# Patient Record
Sex: Male | Born: 1970 | Race: White | Hispanic: No | Marital: Married | Smoking: Former smoker
Health system: Southern US, Community
[De-identification: ages and names within clinical notes are randomized; demographics above are authoritative.]

## PROBLEM LIST (undated history)

## (undated) DIAGNOSIS — E7800 Pure hypercholesterolemia, unspecified: Secondary | ICD-10-CM

## (undated) DIAGNOSIS — K859 Acute pancreatitis without necrosis or infection, unspecified: Secondary | ICD-10-CM

## (undated) DIAGNOSIS — I10 Essential (primary) hypertension: Secondary | ICD-10-CM

## (undated) DIAGNOSIS — E119 Type 2 diabetes mellitus without complications: Secondary | ICD-10-CM

## (undated) HISTORY — PX: ANKLE SURGERY: SHX546

## (undated) HISTORY — PX: BACK SURGERY: SHX140

---

## 2000-04-21 ENCOUNTER — Encounter: Payer: Self-pay | Admitting: Specialist

## 2000-04-23 ENCOUNTER — Inpatient Hospital Stay (HOSPITAL_COMMUNITY): Admission: RE | Admit: 2000-04-23 | Discharge: 2000-04-25 | Payer: Self-pay | Admitting: Specialist

## 2000-12-16 ENCOUNTER — Encounter: Admission: RE | Admit: 2000-12-16 | Discharge: 2000-12-16 | Payer: Self-pay | Admitting: Specialist

## 2000-12-16 ENCOUNTER — Encounter: Payer: Self-pay | Admitting: Specialist

## 2001-07-27 ENCOUNTER — Encounter: Payer: Self-pay | Admitting: Orthopedic Surgery

## 2001-07-28 ENCOUNTER — Inpatient Hospital Stay (HOSPITAL_COMMUNITY): Admission: RE | Admit: 2001-07-28 | Discharge: 2001-07-29 | Payer: Self-pay | Admitting: Orthopedic Surgery

## 2001-12-22 ENCOUNTER — Encounter: Admission: RE | Admit: 2001-12-22 | Discharge: 2001-12-22 | Payer: Self-pay | Admitting: Specialist

## 2001-12-22 ENCOUNTER — Encounter: Payer: Self-pay | Admitting: Specialist

## 2001-12-24 ENCOUNTER — Encounter: Payer: Self-pay | Admitting: Specialist

## 2001-12-24 ENCOUNTER — Encounter: Admission: RE | Admit: 2001-12-24 | Discharge: 2001-12-24 | Payer: Self-pay | Admitting: Specialist

## 2002-01-20 ENCOUNTER — Inpatient Hospital Stay (HOSPITAL_COMMUNITY): Admission: RE | Admit: 2002-01-20 | Discharge: 2002-01-21 | Payer: Self-pay | Admitting: Specialist

## 2002-01-20 ENCOUNTER — Encounter: Payer: Self-pay | Admitting: Specialist

## 2002-09-03 ENCOUNTER — Ambulatory Visit (HOSPITAL_COMMUNITY): Admission: RE | Admit: 2002-09-03 | Discharge: 2002-09-03 | Payer: Self-pay | Admitting: Orthopedic Surgery

## 2002-09-03 ENCOUNTER — Encounter: Payer: Self-pay | Admitting: Orthopedic Surgery

## 2004-12-05 ENCOUNTER — Ambulatory Visit (HOSPITAL_COMMUNITY): Admission: RE | Admit: 2004-12-05 | Discharge: 2004-12-05 | Payer: Self-pay | Admitting: Neurosurgery

## 2005-02-14 ENCOUNTER — Encounter: Admission: RE | Admit: 2005-02-14 | Discharge: 2005-02-14 | Payer: Self-pay | Admitting: Neurosurgery

## 2005-06-24 ENCOUNTER — Inpatient Hospital Stay (HOSPITAL_COMMUNITY): Admission: RE | Admit: 2005-06-24 | Discharge: 2005-06-28 | Payer: Self-pay | Admitting: Neurosurgery

## 2005-08-25 ENCOUNTER — Encounter: Payer: Self-pay | Admitting: Vascular Surgery

## 2005-08-25 ENCOUNTER — Ambulatory Visit (HOSPITAL_COMMUNITY): Admission: RE | Admit: 2005-08-25 | Discharge: 2005-08-25 | Payer: Self-pay | Admitting: Neurosurgery

## 2006-11-24 ENCOUNTER — Ambulatory Visit (HOSPITAL_COMMUNITY): Admission: RE | Admit: 2006-11-24 | Discharge: 2006-11-24 | Payer: Self-pay | Admitting: Neurosurgery

## 2008-02-02 ENCOUNTER — Ambulatory Visit: Payer: Self-pay | Admitting: Cardiology

## 2008-02-04 ENCOUNTER — Inpatient Hospital Stay (HOSPITAL_COMMUNITY): Admission: AD | Admit: 2008-02-04 | Discharge: 2008-02-07 | Payer: Self-pay | Admitting: *Deleted

## 2008-02-04 ENCOUNTER — Emergency Department (HOSPITAL_COMMUNITY): Admission: EM | Admit: 2008-02-04 | Discharge: 2008-02-04 | Payer: Self-pay | Admitting: Emergency Medicine

## 2008-02-04 ENCOUNTER — Ambulatory Visit: Payer: Self-pay | Admitting: *Deleted

## 2008-02-08 ENCOUNTER — Other Ambulatory Visit (HOSPITAL_COMMUNITY): Admission: RE | Admit: 2008-02-08 | Discharge: 2008-02-10 | Payer: Self-pay | Admitting: Psychiatry

## 2008-04-11 ENCOUNTER — Emergency Department (HOSPITAL_COMMUNITY): Admission: EM | Admit: 2008-04-11 | Discharge: 2008-04-11 | Payer: Self-pay | Admitting: Family Medicine

## 2008-04-25 ENCOUNTER — Emergency Department: Payer: Self-pay | Admitting: Emergency Medicine

## 2008-09-21 ENCOUNTER — Encounter: Admission: RE | Admit: 2008-09-21 | Discharge: 2008-09-21 | Payer: Self-pay | Admitting: Neurosurgery

## 2009-10-17 ENCOUNTER — Inpatient Hospital Stay (HOSPITAL_COMMUNITY): Admission: RE | Admit: 2009-10-17 | Discharge: 2009-10-18 | Payer: Self-pay | Admitting: Neurosurgery

## 2009-10-23 ENCOUNTER — Inpatient Hospital Stay (HOSPITAL_COMMUNITY): Admission: RE | Admit: 2009-10-23 | Discharge: 2009-10-24 | Payer: Self-pay | Admitting: Neurosurgery

## 2010-07-11 ENCOUNTER — Other Ambulatory Visit: Payer: Self-pay | Admitting: Neurosurgery

## 2010-07-11 DIAGNOSIS — M47816 Spondylosis without myelopathy or radiculopathy, lumbar region: Secondary | ICD-10-CM

## 2010-07-12 ENCOUNTER — Ambulatory Visit
Admission: RE | Admit: 2010-07-12 | Discharge: 2010-07-12 | Disposition: A | Discharge status: Home / Self Care | Payer: Medicare Other | Source: Ambulatory Visit | Attending: Neurosurgery | Admitting: Neurosurgery

## 2010-07-12 DIAGNOSIS — M47816 Spondylosis without myelopathy or radiculopathy, lumbar region: Secondary | ICD-10-CM

## 2010-08-27 LAB — URINALYSIS, ROUTINE W REFLEX MICROSCOPIC
Glucose, UA: NEGATIVE mg/dL
Hgb urine dipstick: NEGATIVE
Ketones, ur: NEGATIVE mg/dL
Specific Gravity, Urine: 1.024 (ref 1.005–1.030)
Urobilinogen, UA: 1 mg/dL (ref 0.0–1.0)
pH: 6 (ref 5.0–8.0)

## 2010-08-27 LAB — CBC
HCT: 42.5 % (ref 39.0–52.0)
Hemoglobin: 15.1 g/dL (ref 13.0–17.0)
MCHC: 35.5 g/dL (ref 30.0–36.0)
MCV: 95.9 fL (ref 78.0–100.0)
RBC: 4.43 MIL/uL (ref 4.22–5.81)
RDW: 12.6 % (ref 11.5–15.5)

## 2010-08-27 LAB — COMPREHENSIVE METABOLIC PANEL
AST: 17 U/L (ref 0–37)
BUN: 19 mg/dL (ref 6–23)
Calcium: 9.6 mg/dL (ref 8.4–10.5)
Creatinine, Ser: 1.11 mg/dL (ref 0.4–1.5)
Glucose, Bld: 97 mg/dL (ref 70–99)
Potassium: 4.6 mEq/L (ref 3.5–5.1)
Total Bilirubin: 0.4 mg/dL (ref 0.3–1.2)
Total Protein: 6.8 g/dL (ref 6.0–8.3)

## 2010-08-27 LAB — DIFFERENTIAL
Basophils Absolute: 0 10*3/uL (ref 0.0–0.1)
Basophils Relative: 0 % (ref 0–1)
Eosinophils Absolute: 0.1 10*3/uL (ref 0.0–0.7)
Eosinophils Relative: 2 % (ref 0–5)
Lymphocytes Relative: 35 % (ref 12–46)
Lymphs Abs: 2.2 10*3/uL (ref 0.7–4.0)
Monocytes Absolute: 0.4 10*3/uL (ref 0.1–1.0)
Monocytes Relative: 6 % (ref 3–12)

## 2010-08-27 LAB — APTT: aPTT: 29 seconds (ref 24–37)
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
## 2010-10-25 NOTE — Op Note (Signed)
[facility]  Patient:    Martin Riddle, Martin Riddle Visit Number: [id number] MRN: [id number]          Service Type: DSU Location: DAY Attending Physician:  Sherri Rad Dictated by:   Sherri Rad, M.D. Proc. Date: 07/27/01 Admit Date:  07/27/2001                             Operative Report  PREOPERATIVE DIAGNOSES:  1. Left subtalar nonunion.  2. Left tight Achilles tendon.  POSTOPERATIVE DIAGNOSES:  1. Left subtalar nonunion.  2. Left tight Achilles tendon.  OPERATION PERFORMED:  1. Left revision subtalar fusion.  2. Left iliac crest bone graft.  3. Left percutaneous tendo Achilles lengthening.  ANESTHESIA:  General endotracheal tube.  SURGEON:  Sherri Rad, M.D.  ASSISTANT:  Cherly Beach, P.A.-C.  ESTIMATED BLOOD LOSS:  Minimal.  TOURNIQUET TIME:  89 minutes.  COMPLICATIONS:  None.  DISPOSITION:  Stable to P.R.  INDICATIONS FOR PROCEDURE:  This is a 40 year old gentleman who previously underwent a left subtalar fusion November 2002 which went on to a nonunion. This was verified by CT scan which was obtained on December 08, 2000. She has had persistent pain that is interfering with his lifestyle and he cannot do what he wants to do. He was a smoker at the time and has been abstinent for the last three months. He was consented for the above procedure. All risks and benefits were explained, questions were answered.  DESCRIPTION OF PROCEDURE:  The patient was brought to the operating room, placed in supine position after adequate general endotracheal anesthesia tube was administered as well as Ancef IV 1 gm IV piggyback. The left lower extremity was then prepped and draped in a sterile manner as well as the left iliac crest graft site over a proximally placed thigh tourniquet. After the patient was placed in the lateral decubitus position with the left side up, we started the procedure with the left iliac crest bone graft which was  taken from the anterior crest. A longitudinal incision was made over the crest, dissection was carried down to the crest, hemostasis was obtained. The crest was then opened in a hinge manner. With this hinge, we removed a large amount of iliac crest graft, the wound was copiously irrigated with normal saline. This was purely cancellous graft. The hatch was then closed with #0 Vicryl sutures with drill holes, subcu was closed with 3-0 Vicryl, skin was closed with staples. We then went through the previous incision but curved it anteriorly and dissection was carried down anterior to the perineal tendons. The soft tissues were then elevated off the superior neck and anterior fossa of the calcaneus. The extensor digitorum brevis was then elevated off the sinus tarsi. A large amount of soft tissue cartilaginous nonunion debris was then removed from the subtalar joint area. The remaining cartilaginous was then removed with ring curettes as well as a curved 1/4 inch osteotomes. We then copiously irrigated the area with normal saline. We then placed multiple 2 mm drill holes on both the talus and the calcaneal areas of the posterior facet respectively. We then reduced the subtalar joint with distraction through the sinus tarsi between the lateral process of the talus and the anterior process of the calcaneus as well as distraction with the small laminar spreader on the lateral aspect of the hindfoot due to the fact that he has a severe pes planovalgus deformity.  This corrected his deformity and recreated his arch very well. His hindfoot alignment was at about 5-8 degrees of valgus from his previous 10-15 degrees. Once this was done, we then placed a 2 mm K wire that was verified visually through the proper aspect of the subtalar joint in the proper direction which was visualized again by C-arm guidance and lateral view. We then measured the length of the prospected screw using the K wire that was  already in using another K wire against this K wire. This was measured to be  approximately 97 mm. We chose then two 90 mm, 16 mm partially threaded cancellous screws. These were placed parallel to the guidewire, one being in the lateral portion of the talar body and one in the medial, the medial being more posterior. This was placed in parallel alignment with excellent purchase in the talus with compression after a 4.5 mm glide hole through the calcaneus and a 3.5 mm purchase hole in the talus was made. This was verified under C-arm guidance in the AP, ankle, lateral hindfoot and axial hindfoot planes to be in excellent position. This maintained the alignment of the subtalar joint as well. Prior to placement of these screws, we packed the graft with the joint distracted in proper position with the laminar spreaders and this was packed with excellent maintenance of the reduction and had the entire subtalar region packed with cancellous bone graft. This was also verified that there was no graft in the lateral gutter of the ankle as well. This was verified by x-ray as well as visual guidance and freer as well. The wounds were copiously irrigated with normal saline. The skin was closed with 3-0 Vicryl subcu and 4-0 nylon stitch in both feet. Heel as well as the sinus tarsi region are the Ollier incision. We then performed a percutaneous tendo Achilles lengthening with two medial and one lateral hemisections. This had an excellent release of the tight heel cord. We then placed sterile dressings over all wounds. A modified Jones dressing was applied, the patient was stable P.R. Dictated by:   Sherri Rad, M.D. Attending Physician:  Sherri Rad DD:  07/27/01 TD:  07/27/01 Job: [id number]

## 2010-10-25 NOTE — Discharge Summary (Signed)
NAME:  Martin Riddle, Martin Riddle NO.:  [id number]   MEDICAL RECORD NO.:  [id number]          PATIENT TYPE:  INP   LOCATION:  3008                         FACILITY:  [facility]   PHYSICIAN:  Danae Orleans. Venetia Maxon, M.D.  DATE OF BIRTH:  24-Mar-1971   DATE OF ADMISSION:  06/24/2005  DATE OF DISCHARGE:  06/28/2005                                 DISCHARGE SUMMARY   REASON FOR ADMISSION:  Lumbar spondylosis with degeneration at L3-4, L4-5,  and L5-S1 levels.   FINAL DIAGNOSIS:  Lumbar spondylosis with degeneration at L3-4, L4-5, and L5-  S1 levels.   HISTORY OF ILLNESS AND HOSPITAL COURSE:  Martin Riddle is a 40 year old man  with back pain and prior diskectomy with right leg pain.  It was elected to  admit him to the hospital and perform a lumbar decompression and fusion at  the L3-4, L4-5, and L5-S1 levels.  The patient did well with his surgery.  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  The patient was doing well as of the 17th and 18th and gradually mobilizing  with decreased postoperative pain, was ambulating independently as of the  20th, and was discharged home at that point in a stable and satisfactory  condition, having tolerated his operation and hospitalization well.   DISCHARGE MEDICATIONS:  1.  Percocet 10/650 one every 4 hours as needed for pain.  2.  Flexeril 10 mg every 8 hours as needed for muscular spasm.  3.  Restoril 15 mg one to two as needed at night to help with rest.   FOLLOWUP:  He is instructed to follow up with Dr. Channing Mutters in his office in 10  days for suture removal.      Danae Orleans. Venetia Maxon, M.D.  Electronically Signed     JDS/MEDQ  D:  06/28/2005  T:  06/29/2005  Job:  [id number]

## 2010-10-25 NOTE — Op Note (Signed)
Martin Riddle, Martin Riddle                          ACCOUNT NO.:  [id number]   MEDICAL RECORD NO.:  [id number]                   PATIENT TYPE:  INP   LOCATION:  X008                                 FACILITY:  [facility]   PHYSICIAN:  Philips J. Montez Morita, M.D.             DATE OF BIRTH:  02/28/1971   DATE OF PROCEDURE:  01/20/2002  DATE OF DISCHARGE:                                 OPERATIVE REPORT   SURGEON:  Philips J. Montez Morita, M.D.   ASSISTANT:  Illene Labrador. [place]ton, M.D.   PREOPERATIVE DIAGNOSIS:  Herniated disk, L4-5, mostly left but with more  right leg pain.   OPERATIVE PROCEDURE:  Bilateral hemilaminectomy and foraminotomies with disk  excision assisted by a microscope.   DESCRIPTION OF PROCEDURE:  After suitable general anesthesia, he was  positioned on the laminectomy frame and back prepped and draped routinely.  A single needle is inserted between the fourth and fifth spinous processes,  and x-ray made and confirmed disk location.  A routine prep and drape.  The  incision was made, and the two spinous processes were grasped with a Kocher  clamp, and repeat x-ray again confirmed the location.  The laminectomy  interspaces were cleared of muscle and laminectomy done on the first side  and a more generous laminectomy extending distally so that we could look  over the distal body of L5 where some of the disk seemed to be protruded.  Nerve was easily moved over after removing the ligamentum flavum and a wide  laminectomy.  After opening the disk, a large fragment extruded, removed the  other fragments.  Could not feel or palpate any large fragment underlying  the posterior longitudinal ligament on top of the body and an incision in  the posterior and longitudinal ligament there, hooked up on an eighth with a  nerve hook, not really finding anything of any consequence.  Then went to  the other side and did same procedure, although I did not remove the  ligamentum flavum in the midline  nearly as much, so that it would still give  some stability, removed it all to the peripheral portion and followed the  nerve out, freeing the foramen with a good foraminotomy.  Took a look at the  disk there.  There was some bulge, made a cruciate opening in it as well,  cleaned out a little bit of material from that side.  A little Gelfoam  soaked in thrombin on both sides, nice dry field, routine wound closure with  #1 coated Vicryl, 2-0 running Monocryl with Steri-Strips on the skin.  Compression dressing.  He goes to recovery in good condition.  Philips J. Montez Morita, M.D.    PJC/MEDQ  D:  01/20/2002  T:  01/22/2002  Job:  [id number]

## 2010-10-25 NOTE — H&P (Signed)
[facility]  Patient:    Martin Riddle, Martin Riddle Visit Number: [id number] MRN: [id number]          Service Type: Attending:  Sherri Rad, M.D. Dictated by:   Marcie Bal Troncale, P.A.C. Adm. Date:  07/27/01                           History and Physical  DATE OF BIRTH:  05/07/1971  CHIEF COMPLAINT:  Left foot pain.  HISTORY OF PRESENT ILLNESS:  Tiwan Schnitker is a 40 year old male who presents with complaints of left foot pain.  He had previously undergone a talonavicular fusion in November of 2001.  Since then he has had collapse of his subtalar and calcaneal cuboid joints resulting in severe pes planus.  His fusion has failed to heal completely.  He is now at the point that he has constant daily pain in the foot and has elected to undergo repeat surgery on that foot.  REVIEW OF SYSTEMS:  He denies any recent fever or chills.  No diplopia, blurred vision, or headaches.  No rhinorrhea, sore throat, or earache.  No chest pain, shortness of breath, or cough.  No abdominal pain, nausea, vomiting, diarrhea, or constipation.  No melena or bright red blood per rectum.  No urinary frequency, dysuria, or hematuria.  No numbness or tingling in his extremities.  PAST MEDICAL HISTORY:  Significant for borderline hypertension, not currently requiring treatment, and a herniated nucleus pulposus at L4-5.  PAST SURGICAL HISTORY:  As per the HPI.  FAMILY HISTORY:  Significant for diabetes and heart disease in his mother.  ALLERGIES:  No known drug allergies.  CURRENT MEDICATIONS:  Celebrex which he will stop before surgery.  Also, the patient notes that he required OxyContin 10 mg b.i.d., OxyIR, and Toradol postoperatively for good pain control.  SOCIAL HISTORY:  He is married and has two children.  He chews tobacco, but recently quit.  He denies alcohol use.  He is a heavy Location manager. Monica Becton, M.D., is his medical physician.  PHYSICAL EXAMINATION:   He is afebrile.  His pulse is 72, respiratory rate 18, and 122/94.  HEIGHT:  He is 6 feet 4 inches in height and 225 pounds.  GENERAL APPEARANCE:  This is a well-developed, well-nourished male in no acute distress.  HEENT:  The head is normocephalic and atraumatic.  The pupils are equal, round, and reactive to light.  Extraocular movements are grossly intact.  The oropharynx is clear without redness, exudate, or lesions.  NECK:  Supple with no cervical lymphadenopathy.  CHEST:  Clear to auscultation bilaterally with no wheezes or crackles.  HEART:  Regular rate and rhythm with no murmurs, rubs, or gallops.  ABDOMEN:  Soft, nontender, and nondistended with no masses and no hepatosplenomegaly.  BREASTS:  Not examined, not pertinent to present illness.  GENITOURINARY:  Not examined, not pertinent to present illness.  EXTREMITIES:  The skin is intact without rashes or lesions.  DP and PT pulses 2+.  Motor and sensory are grossly intact in the foot.  Hindfoot vague of 10-12 degrees.  He has a too many toe sign.  He is tender in the sinus tarsi area.  No subtalar motion, but it is painful with attempts at this motion.  RADIOGRAPHIC STUDIES:  X-rays and CT scan show the subtalar nonunion.  IMPRESSION: 1. 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 2. Borderline hypertension. 3. Herniated nucleus pulposus at L4-5.  PLAN:  The patient will be admitted to [facility] to undergo a left revision subtalar fusion (correctional), posterior iliac crest bone graft, percutaneous tendon Achilles lengthening versus gastrocnemius slide, and possible medializing calcaneal osteotomy, all performed by Sherri Rad, M.D., on July 27, 2001.  Preoperative labs and signed informed consents will be obtained.  All questions have been encouraged an answered for the patient.  Also of note, we have received preoperative evaluation and clearance from Piedad Climes. Roseanne Reno, M.D., at [facility], who felt that the patient was cleared for surgery and had a normal EKG on July 14, 2001. Dictated by:   Marcie Bal Troncale, P.A.C. Attending:  Sherri Rad, M.D. DD:  07/22/01 TD:  07/22/01 Job: [id number]

## 2010-10-25 NOTE — Op Note (Signed)
NAME:  KNOX, CERVI NO.:  [id number]   MEDICAL RECORD NO.:  [id number]          PATIENT TYPE:  AMB   LOCATION:  SDS                          FACILITY:  [facility]   PHYSICIAN:  Payton Doughty, M.D.      DATE OF BIRTH:  [birth year]   DATE OF PROCEDURE:  06/24/2005  DATE OF DISCHARGE:                                 OPERATIVE REPORT   PREOPERATIVE DIAGNOSIS:  Spondylosis, L3-4, L4-5 and L5-S1.   POSTOPERATIVE DIAGNOSES:  1.  Spondylosis, L3-4.  2.  Recurrent disk, L4-5.  3.  Conjoined nerve root, right L5-S1.   SURGEON:  Payton Doughty, M.D.   NURSE ASSISTANT:  [facility].   DOCTOR ASSISTANT:  Danae Orleans. Venetia Maxon, M.D.   SERVICE:  Neurosurgery.   ANESTHESIA:  General endotracheal anesthesia.   PREPARATION:  Prepped with Betadine prep and scrubbed with alcohol wipe.   COMPLICATIONS:  None.   INDICATION:  This is a 40 year old gentleman who had had a prior diskectomy  by another doctor and has had increasing pain in his back and down his leg.  He was taken to the operating room and smooth anesthetized and intubated,  and placed prone on the operating table.  Following shaving, he prepped and  draped in the usual sterile fashion.  Skin was infiltrated with 1% lidocaine  with 1:400,000 epinephrine.  The skin was incised from the mid L2 to mid S1  and the lamina and transverse processes of L3, L4, L5 and the sacral ala  were exposed bilaterally in the subperiosteal plane.  Intraoperative x-ray  confirmed correctness of the level and having confirmed the correctness of  level, the pars interarticularis, lamina and inferior facets of L3, L4 and  L5, and the superior facets of L4, L5 and S1 were removed bilaterally using  a high-speed drill; this allowed decompression of the nerve roots.  Diskectomy was then carried out at each level.  At L3-4, there was extensive  spondylosis with some facet arthropathy; the disk was extensively  degenerated.  At 4-5, there was a large  recurrent disk on the left side with  significant elevation and compression of the left L5 root.  At L5-S1, the  left side had a fairly large S1 root; the right side had a conjoined L5-S1  root that was significantly encumbered in the neural foramen.  The conjoined  root was completely decompressed, as were the other levels.  No attempt was  made to place a cage on the right side at 5-1, as it was obvious this would  result in a significant injury to the conjoined root.  A left-sided Ray cage  was placed, 12 x 21 mm, and 12 x 21-mm cages were placed at L4-5; L3-4 had  14 x 21-mm cages placed.  Pedicle screws were then placed using the standard  landmarks and intraoperative x-rays showed good placement of the screws on  each side.  They were connected to the rod and the locking caps tightened;  the 90-D system was used.  The wound was irrigated and hemostasis was  assured.  The transverse process and sacral ala were decorticated with a  high-speed drill and packed with INFUSE bone morphogenic protein on the  extender matrix.  It should also be noted the Ray cages were packed with  bone  graft harvested from the facet joints.  Final x-rays showed good placement  of the cages, screws and rods.  The entire area was closed with successive  layers of 0 Vicryl, 2-0 Vicryl and 3-0 nylon.  Betadine and Telfa dressing  were applied and made occlusive with OpSite.  The patient returned to the  recovery room in good condition.           ______________________________  Payton Doughty, M.D.     MWR/MEDQ  D:  06/24/2005  T:  06/25/2005  Job:  [id number]

## 2010-10-25 NOTE — Discharge Summary (Signed)
[place]  Patient:    Martin Riddle, Martin Riddle                        MRN: [id number] Adm. Date:  [id number] Disc. Date: 04/25/00 Attending:  Montez Morita, Philips John Dictator:   Druscilla Brownie. Shela Nevin, P.A.                           Discharge Summary  ADMISSION DIAGNOSES: 1. 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 2. Hypertension.  DISCHARGE DIAGNOSES: 1. 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 2. Hypertension.  OPERATIONS PERFORMED:  On April 23, 2000, the patient underwent talonavicular joint fusion with allograft left foot.  Maud Deed, P.A.-C. assisted.  BRIEF ADMISSION HISTORY:  This 40 year old male with continued foot pain related to collapse of the subtalar and calcaneal cuboid joints.  MRI demonstrated arthropathy of this joint.  He developed a severe pes planus and has nearly constant pain in this foot.  After complications were discussed, we we decided to go ahead with the above procedure.  HOSPITAL COURSE:  The patient tolerated the surgical procedure quite well, but had an inordinate amount of pain postoperatively.  We had to adjust his medications several times.  He did respond to some IV Toradol, and we used the Toradol as this was the only medication that helped his pain control even with IV Demerol and p.o. Mepergan Fortis.  The day of discharge he had been ambulating in the room, and he still complained of a considerable amount of pain and discomfort.  After much discussion they said the [facility] was not helping him "at all," it was decided to with stronger medications.  They have two small children at home, and they were highly desirous to go home despite his discomfort.  Again, I discussed at length the fact that we would need to watch his pain control, and that he needed to keep the foot elevated and keep still at home to allow these medications to work.  LABORATORY AND  X-RAY DATA:  Hematologically showed a CBC which was completely within normal limits.  Blood chemistry was also normal.  Urinalysis negative.  Chest x-ray showed no active disease.  Electrocardiogram:  Normal sinus rhythm.  CONDITION ON DISCHARGE:  Improved, stable.  DISPOSITION:  The patient was discharged to his home in the care of his family.  SPECIAL DISCHARGE INSTRUCTIONS:  He is to keep the foto elevated, use ice ot the foot p.r.n.  The elevation should be at least above the heart.  He is to continue with touchdown weightbearing of the right foot.  DISCHARGE MEDICATIONS:  We decided to go with: 1. OxyContin 10 mg b.i.d. x 2 weeks only, #30 given. 2. OxyIR x 2 weeks only, #40, one to two q.4-6h. p.r.n. breakthrough pain. 3. Robaxin 500 mg #30 one q.6h. p.r.n. muscle spasm. 4. Toradol 10 mg #9 only one t.i.d. and then go back to his Vioxx. 5. I recommended they get a stool softener as well. 6. He wanted something for sleep, so he was given a short term of Ambien 10 mg    x 2 weeks only one h.s. p.r.n. sleep. DD:  04/25/00 TD:  04/26/00 Job: [id number]

## 2010-10-25 NOTE — H&P (Signed)
NAME:  Martin Riddle, Martin Riddle NO.:  [id number]   MEDICAL RECORD NO.:  [id number]           PATIENT TYPE:   LOCATION:                                 FACILITY:   PHYSICIAN:  Payton Doughty, M.D.           DATE OF BIRTH:   DATE OF ADMISSION:  06/24/2005  DATE OF DISCHARGE:                                HISTORY & PHYSICAL   ADMISSION DIAGNOSES:  Spondylosis at L3-4, L4-5, L5-S1.   HISTORY OF PRESENT ILLNESS:  This is a very nice 40 year old right handed  white gentleman, had a L4-5 diskectomy done 2003.  Since then, he has had  pain in his back and down his right lower extremity.  It is getting worse.  He notes that if he mows his grass, gets up off the mower and takes a couple  of steps, he will have pain in his back that will get down off his right leg  and take him off his feet.  He is on disability with his back.  I visited  with him in June with worsening back pain and pain down his right lower  extremity.  Ended up obtaining an MRI that showed spondylosis at L3-4 and L4-  5.  Diskography was completed and it was strongly concordantly positive at  L3-4 and L4-5.  He is now admitted for an L3-S1 fusion.   PAST MEDICAL HISTORY:  Unremarkable for significant disease as he had ankle  fusion in 2003 by Dr. Fonnie Jarvis.   PAST SURGICAL HISTORY:  Back operation in 2003 by Dr. Montez Morita.   MEDICATIONS:  1.  Percocet three per day.  2.  Neurontin 300 mg three times a day.  3.  Celebrex 200 mg b.i.d.   ALLERGIES:  No known drug allergies.   SOCIAL HISTORY:  Does not smoke or drink, and is no disability.   FAMILY HISTORY:  Mom is in poor health in her 64's.  Father is in good  health in his 22's.  Mother has diabetes and heart disease.   REVIEW OF SYSTEMS:  Remarkable for glasses, back pain, leg pain.   PHYSICAL EXAMINATION:  HEENT:  Within normal limits.  NECK:  Good range of motion.  CARDIAC:  Regular rate and rhythm.  ABDOMEN:  Nontender with no hepatosplenomegaly.  EXTREMITIES:  Without clubbing or cyanosis.  GU:  Deferred.  EXTREMITIES:  Peripheral pulses good.  NEUROLOGICAL:  Awake, alert and oriented.  Cranial nerves are intact.  Motor  exam shows 5/5 strength throughout the upper and lower extremities.  Sensory  deficit is described in the right L5-S1 distribution.  Reflexes are absent  in knees and ankles.  Straight leg raising is very posterior on the right.  Radiographic results have been reviewed above.   CLINICAL IMPRESSION:  Severe lumbar spondylosis with resolvent  radiculopathy.   PLAN:  Lumbar laminectomy, diskectomy, posterior lumbar body fusion and  posterolateral arthrodesis from L3-S1.  Risks and benefits of this approach  have been discussed with him.  He wishes to proceed.  ______________________________  Payton Doughty, M.D.     MWR/MEDQ  D:  06/24/2005  T:  06/24/2005  Job:  [id number]

## 2014-03-10 ENCOUNTER — Inpatient Hospital Stay: Payer: Self-pay | Admitting: Internal Medicine

## 2014-03-10 LAB — COMPREHENSIVE METABOLIC PANEL
ALBUMIN: 3 g/dL — AB (ref 3.4–5.0)
ALK PHOS: 69 U/L
ALT: 37 U/L
ANION GAP: 11 (ref 7–16)
AST: 166 U/L — AB (ref 15–37)
BUN: 12 mg/dL (ref 7–18)
Bilirubin,Total: 1.1 mg/dL — ABNORMAL HIGH (ref 0.2–1.0)
Calcium, Total: 7.5 mg/dL — ABNORMAL LOW (ref 8.5–10.1)
Chloride: 97 mmol/L — ABNORMAL LOW (ref 98–107)
Co2: 19 mmol/L — ABNORMAL LOW (ref 21–32)
Creatinine: 0.13 mg/dL — ABNORMAL LOW (ref 0.60–1.30)
EGFR (African American): >60
Glucose: 415 mg/dL — ABNORMAL HIGH (ref 65–99)
Osmolality: 273 (ref 275–301)
SODIUM: 127 mmol/L — AB (ref 136–145)
TOTAL PROTEIN: 9.5 g/dL — AB (ref 6.4–8.2)

## 2014-03-10 LAB — CBC
HCT: 45.5 % (ref 40.0–52.0)
HGB: 15.2 g/dL (ref 13.0–18.0)
MCH: 31.8 pg (ref 26.0–34.0)
MCHC: 33.4 g/dL (ref 32.0–36.0)
MCV: 95 fL (ref 80–100)
Platelet: 261 10*3/uL (ref 150–440)
RBC: 4.78 10*6/uL (ref 4.40–5.90)
RDW: 13.6 % (ref 11.5–14.5)
WBC: 17.8 10*3/uL — ABNORMAL HIGH (ref 3.8–10.6)

## 2014-03-10 LAB — URINALYSIS, COMPLETE
BACTERIA: NONE SEEN
Bilirubin,UR: NEGATIVE
LEUKOCYTE ESTERASE: NEGATIVE
Nitrite: NEGATIVE
PH: 5 (ref 4.5–8.0)
Protein: 100
RBC,UR: 4 /HPF (ref 0–5)
SPECIFIC GRAVITY: 1.028 (ref 1.003–1.030)
Squamous Epithelial: NONE SEEN

## 2014-03-10 LAB — LIPASE, BLOOD: Lipase: 273 U/L (ref 73–393)

## 2014-03-10 LAB — TROPONIN I: Troponin-I: <0.02 ng/mL

## 2014-03-11 LAB — COMPREHENSIVE METABOLIC PANEL
ALBUMIN: 3.2 g/dL — AB (ref 3.4–5.0)
ALK PHOS: 90 U/L
AST: 42 U/L — AB (ref 15–37)
Anion Gap: 16 (ref 7–16)
BILIRUBIN TOTAL: 0.8 mg/dL (ref 0.2–1.0)
BUN: 14 mg/dL (ref 7–18)
CO2: 14 mmol/L — AB (ref 21–32)
Calcium, Total: 8 mg/dL — ABNORMAL LOW (ref 8.5–10.1)
Chloride: 100 mmol/L (ref 98–107)
Creatinine: 0.72 mg/dL (ref 0.60–1.30)
EGFR (Non-African Amer.): >60
GLUCOSE: 270 mg/dL — AB (ref 65–99)
Osmolality: 271 (ref 275–301)
POTASSIUM: 4.3 mmol/L (ref 3.5–5.1)
Sodium: 130 mmol/L — ABNORMAL LOW (ref 136–145)
Total Protein: 8.8 g/dL — ABNORMAL HIGH (ref 6.4–8.2)

## 2014-03-11 LAB — BASIC METABOLIC PANEL
Anion Gap: 15 (ref 7–16)
BUN: 14 mg/dL (ref 7–18)
CO2: 13 mmol/L — AB (ref 21–32)
CREATININE: 0.74 mg/dL (ref 0.60–1.30)
Calcium, Total: 8.9 mg/dL (ref 8.5–10.1)
Chloride: 105 mmol/L (ref 98–107)
EGFR (African American): >60
GLUCOSE: 298 mg/dL — AB (ref 65–99)
Osmolality: 278 (ref 275–301)
POTASSIUM: 4.9 mmol/L (ref 3.5–5.1)
Sodium: 133 mmol/L — ABNORMAL LOW (ref 136–145)

## 2014-03-11 LAB — CBC WITH DIFFERENTIAL/PLATELET
BASOS PCT: 0.4 %
Basophil #: 0.1 10*3/uL (ref 0.0–0.1)
EOS ABS: 0 10*3/uL (ref 0.0–0.7)
Eosinophil %: 0 %
HCT: 48 % (ref 40.0–52.0)
HGB: 15.9 g/dL (ref 13.0–18.0)
LYMPHS ABS: 1.7 10*3/uL (ref 1.0–3.6)
LYMPHS PCT: 8.6 %
MCH: 31.9 pg (ref 26.0–34.0)
MCHC: 33.2 g/dL (ref 32.0–36.0)
MCV: 96 fL (ref 80–100)
Monocyte #: 1.5 x10 3/mm — ABNORMAL HIGH (ref 0.2–1.0)
Monocyte %: 7.4 %
NEUTROS ABS: 16.5 10*3/uL — AB (ref 1.4–6.5)
Neutrophil %: 83.6 %
Platelet: 299 10*3/uL (ref 150–440)
RBC: 4.99 10*6/uL (ref 4.40–5.90)
RDW: 13.6 % (ref 11.5–14.5)
WBC: 19.8 10*3/uL — AB (ref 3.8–10.6)

## 2014-03-11 LAB — HEMOGLOBIN A1C: Hemoglobin A1C: 12.3 % — ABNORMAL HIGH (ref 4.2–6.3)

## 2014-03-11 LAB — LIPID PANEL
Cholesterol: 537 mg/dL — ABNORMAL HIGH (ref 0–200)
HDL: 28 mg/dL — AB (ref 40–60)
Triglycerides: 2244 mg/dL — ABNORMAL HIGH (ref 0–200)

## 2014-03-11 LAB — AMYLASE: AMYLASE: 40 U/L (ref 25–115)

## 2014-03-11 LAB — T4, FREE: FREE THYROXINE: 1.01 ng/dL (ref 0.76–1.46)

## 2014-03-11 LAB — TSH: Thyroid Stimulating Horm: 0.22 u[IU]/mL — ABNORMAL LOW

## 2014-03-11 LAB — LIPASE, BLOOD: Lipase: 376 U/L (ref 73–393)

## 2014-03-12 LAB — CBC WITH DIFFERENTIAL/PLATELET
BASOS PCT: 0.7 %
Basophil #: 0.1 10*3/uL (ref 0.0–0.1)
Eosinophil #: 0 10*3/uL (ref 0.0–0.7)
Eosinophil %: 0 %
HCT: 46 % (ref 40.0–52.0)
HGB: 14.7 g/dL (ref 13.0–18.0)
LYMPHS PCT: 14 %
Lymphocyte #: 2.2 10*3/uL (ref 1.0–3.6)
MCH: 31.3 pg (ref 26.0–34.0)
MCHC: 32 g/dL (ref 32.0–36.0)
MCV: 98 fL (ref 80–100)
Monocyte #: 1.3 x10 3/mm — ABNORMAL HIGH (ref 0.2–1.0)
Monocyte %: 8.3 %
Neutrophil #: 12.2 10*3/uL — ABNORMAL HIGH (ref 1.4–6.5)
Neutrophil %: 77 %
Platelet: 249 10*3/uL (ref 150–440)
RBC: 4.69 10*6/uL (ref 4.40–5.90)
RDW: 14 % (ref 11.5–14.5)
WBC: 15.9 10*3/uL — ABNORMAL HIGH (ref 3.8–10.6)

## 2014-03-12 LAB — BASIC METABOLIC PANEL
Anion Gap: 13 (ref 7–16)
BUN: 17 mg/dL (ref 7–18)
CALCIUM: 8.6 mg/dL (ref 8.5–10.1)
CO2: 15 mmol/L — AB (ref 21–32)
Chloride: 106 mmol/L (ref 98–107)
Creatinine: 0.9 mg/dL (ref 0.60–1.30)
Glucose: 231 mg/dL — ABNORMAL HIGH (ref 65–99)
Osmolality: 277 (ref 275–301)
Potassium: 4 mmol/L (ref 3.5–5.1)
SODIUM: 134 mmol/L — AB (ref 136–145)

## 2014-03-13 LAB — LIPID PANEL
Cholesterol: 376 mg/dL — ABNORMAL HIGH (ref 0–200)
HDL Cholesterol: 25 mg/dL — ABNORMAL LOW (ref 40–60)
Triglycerides: 839 mg/dL — ABNORMAL HIGH (ref 0–200)

## 2014-03-13 LAB — BASIC METABOLIC PANEL
Anion Gap: 9 (ref 7–16)
BUN: 18 mg/dL (ref 7–18)
CALCIUM: 8.2 mg/dL — AB (ref 8.5–10.1)
CHLORIDE: 105 mmol/L (ref 98–107)
CO2: 19 mmol/L — AB (ref 21–32)
Creatinine: 0.79 mg/dL (ref 0.60–1.30)
EGFR (African American): >60
EGFR (Non-African Amer.): >60
Glucose: 216 mg/dL — ABNORMAL HIGH (ref 65–99)
Osmolality: 275 (ref 275–301)
Potassium: 3.6 mmol/L (ref 3.5–5.1)
Sodium: 133 mmol/L — ABNORMAL LOW (ref 136–145)

## 2014-03-13 LAB — LIPASE, BLOOD: Lipase: 526 U/L — ABNORMAL HIGH (ref 73–393)

## 2014-03-14 LAB — BASIC METABOLIC PANEL
Anion Gap: 11 (ref 7–16)
BUN: 13 mg/dL (ref 7–18)
CALCIUM: 8 mg/dL — AB (ref 8.5–10.1)
Chloride: 105 mmol/L (ref 98–107)
Co2: 19 mmol/L — ABNORMAL LOW (ref 21–32)
Creatinine: 0.22 mg/dL — ABNORMAL LOW (ref 0.60–1.30)
Glucose: 131 mg/dL — ABNORMAL HIGH (ref 65–99)
OSMOLALITY: 272 (ref 275–301)
POTASSIUM: 5.4 mmol/L — AB (ref 3.5–5.1)
SODIUM: 135 mmol/L — AB (ref 136–145)

## 2014-03-14 LAB — LIPASE, BLOOD: LIPASE: 422 U/L — AB (ref 73–393)

## 2014-03-14 LAB — POTASSIUM: POTASSIUM: 3.7 mmol/L (ref 3.5–5.1)

## 2014-03-15 LAB — CBC WITH DIFFERENTIAL/PLATELET
BANDS NEUTROPHIL: 8 %
EOS PCT: 1 %
HCT: 40.4 % (ref 40.0–52.0)
HGB: 13.4 g/dL (ref 13.0–18.0)
LYMPHS PCT: 26 %
MCH: 31.3 pg (ref 26.0–34.0)
MCHC: 33.2 g/dL (ref 32.0–36.0)
MCV: 94 fL (ref 80–100)
Metamyelocyte: 3 %
Monocytes: 11 %
Myelocyte: 1 %
PLATELETS: 173 10*3/uL (ref 150–440)
RBC: 4.28 10*6/uL — AB (ref 4.40–5.90)
RDW: 13.1 % (ref 11.5–14.5)
Segmented Neutrophils: 49 %
Variant Lymphocyte - H1-Rlymph: 1 %
WBC: 11.4 10*3/uL — AB (ref 3.8–10.6)

## 2014-03-15 LAB — BASIC METABOLIC PANEL
Anion Gap: 7 (ref 7–16)
BUN: 9 mg/dL (ref 7–18)
CO2: 24 mmol/L (ref 21–32)
CREATININE: 0.64 mg/dL (ref 0.60–1.30)
Calcium, Total: 8.4 mg/dL — ABNORMAL LOW (ref 8.5–10.1)
Chloride: 104 mmol/L (ref 98–107)
Glucose: 127 mg/dL — ABNORMAL HIGH (ref 65–99)
Osmolality: 270 (ref 275–301)
Potassium: 3.1 mmol/L — ABNORMAL LOW (ref 3.5–5.1)
Sodium: 135 mmol/L — ABNORMAL LOW (ref 136–145)

## 2014-08-29 ENCOUNTER — Emergency Department: Payer: Self-pay | Admitting: Emergency Medicine

## 2014-09-04 LAB — D-DIMER(ARMC): D-Dimer: 226 ng/ml

## 2014-09-04 LAB — TROPONIN I: Troponin-I: <0.03 ng/mL

## 2014-09-30 NOTE — H&P (Signed)
PATIENT NAME:  Martin Riddle, Martin Riddle MR#:  [id number] DATE OF BIRTH:  1970-11-04  DATE OF ADMISSION:  03/10/2014  PRIMARY CARE PHYSICIAN: Nonlocal.   REFERRING PHYSICIAN: Onnie Boer, MD   CHIEF COMPLAINT: Epigastric abdominal pain associated with nausea, vomiting for the past 2 days.   HISTORY OF PRESENT ILLNESS: The patient is a 44 year old Caucasian male with past medical history of diabetes mellitus, and chronic anxiety who is presenting to the ED with a  chief complaint of 2 day history of epigastric abdominal pain associated with intractable nausea and vomiting. Ultrasound of the abdomen did not reveal any gallstones. CAT scan of the abdomen and pelvis with contrast has revealed acute uncomplicated pancreatitis. No evidence of choledocholithiasis.  Outpatient was made n.p.o., IV fluids were given, pain medication were given; the patient was given Dilaudid as he is ALLERGIC TO MORPHINE.   Hospitalist team is called to admit the patient for acute pancreatitis.   During my examination, patient is sleepy as he has just received Dilaudid but arousable and falling asleep. Wife is at bedside and provided history. I was unable to get any history from the patient.   PAST MEDICAL HISTORY: Diabetes mellitus, noninsulin-requiring; chronic history of anxiety.   PAST SURGICAL HISTORY: Eight back surgeries in the past, foot surgery.   ALLERGIES: MORPHINE.   PSYCHOSOCIAL HISTORY: Lives at home with wife. He used to smoke, but quit smoking several years ago, denies any history of alcoholism, or illicit drug usage.   FAMILY HISTORY: Diabetes mellitus runs in his family.   REVIEW OF SYSTEMS: Unobtainable as the patient is sedated from Dilaudid adverse effect.   PHYSICAL EXAMINATION: VITAL SIGNS: Temperature 97.9, pulse 108, respirations 18, blood pressure 170/94, pulse oximetry 94%.  GENERAL APPEARANCE: Not in acute distress, moderately built and nourished.  HEENT: Normocephalic, atraumatic. Pupils  are equally reacting to light and accommodation. No scleral icterus. No conjunctival injection. No sinus tenderness, dry mucous membranes.  NECK: Supple. No JVD. No thyromegaly. Range of motion is intact.  LUNGS: Clear to auscultation bilaterally. No accessory muscle usage noted.  CARDIAC: S1, S2 normal, regular rate and rhythm. No murmurs.  GASTROINTESTINAL: Soft. Bowel sounds are positive in all 4 quadrants, positive epigastric tenderness. No rebound tenderness. No masses felt.  NEUROLOGIC: The patient is lethargic as he has received Dilaudid, otherwise reflexes are 2+, motor and sensory could not be elicited as the patient was not following verbal commands.  SKIN: Warm to touch, decreased turgor; no rashes, no lesions.  MUSCULOSKELETAL: No joint effusion, tenderness.  PSYCHIATRIC: Mood and affect could not be elicited.   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 No evidence of urinary obstruction; chest x-ray PA and lateral views; no acute cardiopulmonary process; ultrasound of the abdomen, no gallstones, common bile duct upper limits normal at 7 mm, negative Murphy sign, minimal prominence of gallbladder wall at 3.3 mm, no pericholecystic fluid collection, fat infiltration of the liver, left lobe of the liver not well visualize; EKG, sinus tachycardia, Accu-Chek 310, at 6:30 p.m., glucose 415, BUN is normal, creatinine 0.13,  sodium 127, GFR greater than  60, anion gap is 11, serum osmolality normal, calcium 7.5, lipase is normal at 273; LFTs, alkaline phosphatase is normal, AST 166, ALT is normal, total bilirubin 1.1, albumin is 3.0, total protein 9.5, troponin less than 0.02; urinalysis, straw-colored, glucose greater than 500, nitrates and leukocyte esterase are negative, WBC elevated at 17.8; rest of the CBC was normal.   ASSESSMENT AND PLAN: A 44 year old male with 2 day history of epigastric abdominal pain associated with nausea and intractable vomiting coming into the ED. Lipase is normal, but CAT scan of the abdomen has revealed acute pancreatitis.   ASSESSMENT AND PLAN: 1.  Acute epigastric abdominal pain with nausea and vomiting probably from acute pancreatitis.  2.  Acute pancreatitis etiology is unclear at this time. We will check fasting lipid panel, it is not drug induced  based on his home medication adverse effect check. This patient is not alcoholic; lipase and alkaline phosphatase are normal; abdominal ultrasound did not reveal any Murphy sign, gallbladder is normal.  3. We will keep him n.p.o., provide IV fluids, Pepcid for GI prophylaxis, the Dilaudid as needed for pain management as the patient is ALLERGIC TO MORPHINE.  4. Diabetes mellitus non-insulin-requiring. We will provide him insulin sliding scale, hold metformin as the patient is n.p.o. and also received contrast for CT of the abdomen.  5.  Pseudohyponatremia secondary to hyperglycemia.  We will provide aggressive hydration with IV fluids and sliding scale insulin.  6.  Anxiety. We will provide anxiety medicine as needed basis.  7.  We will provide gastrointestinal prophylaxis and deep vein thrombosis prophylaxis with Lovenox.  8.  He is FULL CODE. Wife is the medical power of attorney.   Plan of care discussed in detail with the patient's wife at bedside as patient is sedated from Dilaudid. She verbalized understanding of the plan.   TOTAL TIME SPENT ON ADMISSION: Was 45  minutes.    ____________________________ Ramonita LabAruna Marqueta Pulley, MD ag:nt D: 03/10/2014 19:43:57 ET T: 03/10/2014 22:50:00 ET JOB#: [id number]  cc: Ramonita LabAruna Piera Downs, MD, <Dictator> Ramonita LabARUNA Yetta Marceaux MD ELECTRONICALLY SIGNED 03/18/2014 18:36

## 2014-09-30 NOTE — Discharge Summary (Signed)
PATIENT NAME:  Martin Riddle, Martin Riddle MR#:  [id number] DATE OF BIRTH:  Oct 26, 1970  DATE OF ADMISSION:  03/10/2014 DATE OF DISCHARGE: 03/15/2014   ADMISSION DIAGNOSES:  1.  Acute pancreatitis.  2.  Uncontrolled diabetes.   DISCHARGE DIAGNOSES:  1.  Acute pancreatitis secondary to hypertriglyceridemia due to uncontrolled diabetes. 2.  Uncontrolled type 2 diabetes with mild diabetic ketoacidosis.  3.  Acidosis, metabolic from dehydration and pancreatitis and diabetic ketoacidosis. 4.  Hypertriglyceridemia from uncontrolled diabetes.  5.  Elevated TSH, normal T4 with low T3.  6.  Elevated blood pressure, no diagnosis of hypertension.  7.  Hypokalemia at discharge.   CONSULTATIONS: A. Wendall Mola, MD  PERTINENT LABORATORIES AT DISCHARGE: Sodium 135, potassium 3.1, chloride 104, bicarbonate 24, BUN 9, creatinine 0.64, glucose 127. White blood cells 11.4, hemoglobin 13.4, hematocrit 41, platelets 173,000.   Discharge cholesterol is 276, triglycerides 839, HDL 25. Hemoglobin A1c is 12.   HOSPITAL COURSE: A 44 year old male presented with midepigastric pain radiating to his back, had a CT scan which showed acute pancreatitis. For further details, please refer to H and P.  1.  Acute pancreatitis secondary to hypertriglyceridemia from uncontrolled diabetes. The patient was admitted to the hospitalist service, treated for acute pancreatitis with supportive care including IV pain medications and IV fluids. The patient is much greatly improved as far as abdominal pain and tolerating his diet.  2.  Uncontrolled type 2 diabetes with mild DKA. I appreciate Dr. Pricilla Handler consult. The patient had ketones in the urine. His A1c is 12. The patient is now on Levemir 28 units subcutaneous q. 12 hours. He is referred to outpatient diabetes and will follow up with Dr. Tedd Sias next week.  3.  Metabolic acidosis. A combination of pancreatitis and DKA, resolved.  4.  Hypertriglyceridemia from uncontrolled diabetes. The patient  was started on gemfibrozil. His triglyceride level has actually improved since admission. He needs better control of his diabetes. He will need repeat LFTs in 6 weeks. 5.  Elevated TSH with low T3, normal T4. Likely euthyroid. The patient will need repeat TFTs in 6 weeks.  6.  Elevated blood pressure, no diagnosis of hypertension. His blood pressure was elevated here. We started on ACE inhibitor, which also is there for renal protection from his diabetes.  7.  Hypokalemia, which was repleted prior to discharge.   DISCHARGE PHYSICAL EXAMINATION: VITAL SIGNS: The patient is afebrile with temperature 98.2, pulse is 97, blood pressure 127/76 and 92% on room air.  GENERAL: The patient alert, oriented, not in acute distress.  CARDIOVASCULAR: Regular rate and rhythm. No murmur, gallops, rubs. PMI is not displaced.  ABDOMEN: Bowel sounds are positive. Nontender, nondistended. No hepatosplenomegaly. No rebound or guarding.  BACK no CVAT or vertebral tenderness.  EXTREMITIES: No clubbing, cyanosis, edema.  NEUROLOGICAL: Cranial nerves II-XII are intact.  DISCHARGE MEDICATIONS:  1.  Xanax 0.5 mg at bedtime.  2.  OxyContin 60 mg q. 8 hours.  3.  Oxycodone 30 mg t.i.d.  4.  Acetaminophen 325 daily.  5.  Lisinopril 40 mg daily.  6.  Detemir 28 units b.i.d.  7.  Gemfibrozil 600 mg b.i.d.  8.  Glucometer with test strips and lancets.   DISCHARGE DIET: ADA diet.   DISCHARGE ACTIVITY: As tolerated.   DISCHARGE FOLLOWUP: The patient will follow up in 1 week with Dr. Tedd Sias.   TIME SPENT: Thirty-five minutes.   DISPOSITION: The patient was stable for discharge.    ____________________________ Wilberth Damon P. Juliene Pina, MD spm:TT D: 03/15/2014  13:36:31 ET T: 03/15/2014 17:30:48 ET JOB#: [id number]  cc: Oliviana Mcgahee P. Juliene PinaMody, MD, <Dictator> A. Wendall MolaMelissa Solum, MD Jaylea Plourde P Braydn Carneiro MD ELECTRONICALLY SIGNED 03/15/2014 21:26

## 2014-09-30 NOTE — Consult Note (Signed)
PATIENT NAME:  Martin Riddle, Martin Riddle MR#:  [id number] DATE OF BIRTH:  04/27/1971  DATE OF CONSULTATION:  03/11/2014  REFERRING PHYSICIAN:  Sital P. Mody, MD CONSULTING PHYSICIAN:  A. Melissa , MD  CHIEF COMPLAINT: Diabetes and hyperlipidemia with hypertriglyceridemia-induced acute pancreatitis.   HISTORY OF PRESENT ILLNESS: This is a 43-year-old male with a history of diabetes mellitus, admitted with 3 days abdominal pain, nausea, vomiting. Initial workup revealed elevated lipase and evidence of acute pancreatitis on CT imaging of the abdomen. Also noted to have uncontrolled diabetes with initial blood sugar of 415 and metabolic acidosis with an initial bicarbonate of 19. The patient has been started on IV fluids. He reports persistent abdominal pain, poor appetite and nausea. He has been afebrile.   The patient reports an approximately 2 year history of diabetes. Recalls seeing a physician in the past who prescribed metformin; however, as that physician was not covered on his insurance plan at that time, he never had any followup. Only took metformin for a few months. Has never taken any other medications for diabetes. Was not aware he had a history of hyperlipidemia. At this time, he does not have a primary care provider. Hemoglobin A1c is 12.3% and type 2 diabetes is uncontrolled. The patient has no known history of thyroid disease. He is noted to have a low TSH level of 0.22 with a normal free T4 level of 1.01.   PAST MEDICAL HISTORY: 1.  Chronic low back pain status post lumbar spine surgery.  2.  Diabetes mellitus.  3.  Hyperlipidemia.   ALLERGIES: MORPHINE.   OUTPATIENT MEDICATIONS: Include OxyContin and oxycodone.   SOCIAL HISTORY: The patient is married. Denies tobacco use. Denies alcohol use.   FAMILY HISTORY: Mother had diabetes with prior lower extremity amputations.   REVIEW OF SYSTEMS: GENERAL: No weight loss. No fever.  HEENT: No blurred vision. No sore throat.  NECK: No neck  pain. No dysphasia.  CARDIAC: No chest pain or palpitation.  PULMONARY: No cough. No shortness of breath.  ABDOMEN: He reports fairly severe abdominal pain. Reports nausea. Reports poor appetite. He had emesis earlier today.  EXTREMITIES: Denies lower extremity swelling. Denies muscle weakness.  ENDOCRINOLOGIC: Denies heat or cold intolerance.  GENITOURINARY: Denies dysuria or hematuria.  SKIN: Denies rash or pruritus.  NEUROLOGIC: Denies falls or tremor.   PHYSICAL EXAMINATION: VITAL SIGNS: Height 75.9 inches, weight 247 pounds, BMI 30. Temperature 98.2, pulse 102, respirations 18, blood pressure 146/83, pulse oximetry 96% on room air.  GENERAL: Overweight white male, appears uncomfortable.  HEENT: EOMI. Oropharynx is clear. Mucous membranes moist.  NECK: Supple. No thyromegaly. No neck tenderness to palpation.  LYMPHATIC: No submandibular or anterior cervical lymphadenopathy.  CARDIAC: Tachycardia, regular rhythm, no murmur.  PULMONARY: Clear to auscultation bilaterally. No wheeze. No rails.  ABDOMEN: Soft, diffuse tenderness. No guarding.  EXTREMITIES: No peripheral edema is present.  SKIN: No rash or dermatopathy is noted.  PSYCHIATRIC: Alert and oriented, calm, cooperative.  NEUROLOGIC: No tremor of outstretched hands is present.   LABORATORY DATA: Glucose 270, BUN 14, creatinine 0.72, sodium 130, potassium 4.3, CO2 of 14, calcium 8. Total cholesterol 537, triglycerides 2244. Hemoglobin A1c 12.3%, WBC 8.8. Albumin 2.2, AST 42, TSH 0.22, T4 free 1.01. WBC 19.8, hematocrit 48%, platelets 299,000.   ASSESSMENT: 1.  Acute pancreatitis due to hypertriglyceridemia.  2.  Mixed hyperlipidemia.  3.  Uncontrolled type 2 diabetes.  4.  Metabolic acidosis with 2+ urinary ketones and hyperglycemia, consistent with diabetic ketoacidosis.  5.    Leukocytosis likely as an acute inflammatory response to pancreatitis.  PLAN: 1.  For diabetes with mild DKA, he has been started on subcutaneous  insulin. I will repeat a met B now. If he has persistent acidosis, my preference would be to change to IV insulin. If acidosis improving, would adjust his basal insulin to Levemir 15 units q. 12 hours. No need for prandial insulin as he is not eating. Continue current NovoLog insulin sliding scale before meals and at bedtime.  2.  For hypertriglyceridemia, add gemfibrozil 600 mg b.i.d. Insulin will certainly help improve triglycerides as well.   3.  Keep n.p.o. for now  4.  Pain control needed. He is on long-standing narcotics and likely will need judicious narcotics given some opioid tolerance. Will defer to hospitalist team.   Thank you for the kind request for consultation, I will follow along with you.    ____________________________ A. Melissa , MD ams:at D: 03/11/2014 11:40:32 ET T: 03/11/2014 16:53:25 ET JOB#: [id number]  cc: A. Melissa , MD, <Dictator> A. MELISSA  MD ELECTRONICALLY SIGNED 03/20/2014 13:15 

## 2014-12-26 ENCOUNTER — Emergency Department
Admission: EM | Admit: 2014-12-26 | Discharge: 2014-12-26 | Disposition: A | Discharge status: Home / Self Care | Payer: Medicare Other | Attending: Emergency Medicine | Admitting: Emergency Medicine

## 2014-12-26 ENCOUNTER — Encounter: Payer: Self-pay | Admitting: Urgent Care

## 2014-12-26 DIAGNOSIS — F410 Panic disorder [episodic paroxysmal anxiety] without agoraphobia: Secondary | ICD-10-CM | POA: Diagnosis not present

## 2014-12-26 DIAGNOSIS — F419 Anxiety disorder, unspecified: Secondary | ICD-10-CM

## 2014-12-26 DIAGNOSIS — I10 Essential (primary) hypertension: Secondary | ICD-10-CM | POA: Diagnosis not present

## 2014-12-26 DIAGNOSIS — F411 Generalized anxiety disorder: Secondary | ICD-10-CM

## 2014-12-26 DIAGNOSIS — E119 Type 2 diabetes mellitus without complications: Secondary | ICD-10-CM | POA: Diagnosis not present

## 2014-12-26 HISTORY — DX: Type 2 diabetes mellitus without complications: E11.9

## 2014-12-26 HISTORY — DX: Pure hypercholesterolemia, unspecified: E78.00

## 2014-12-26 HISTORY — DX: Essential (primary) hypertension: I10

## 2014-12-26 LAB — GLUCOSE, CAPILLARY: Glucose-Capillary: 313 mg/dL — ABNORMAL HIGH (ref 65–99)

## 2014-12-26 MED ORDER — LORAZEPAM 2 MG PO TABS
2.0000 mg | ORAL_TABLET | Freq: Once | ORAL | Status: AC
  Administered 2014-12-26: 2 mg via ORAL
  Filled 2014-12-26: qty 1

## 2014-12-26 NOTE — ED Notes (Signed)
Pt laying in bed with eyes closed.

## 2014-12-26 NOTE — Discharge Instructions (Signed)
Panic Attacks Panic attacks are sudden, short-livedsurges of severe anxiety, fear, or discomfort. They may occur for no reason when you are relaxed, when you are anxious, or when you are sleeping. Panic attacks may occur for a number of reasons:   Healthy people occasionally have panic attacks in extreme, life-threatening situations, such as war or natural disasters. Normal anxiety is a protective mechanism of the body that helps us react to danger (fight or flight response).  Panic attacks are often seen with anxiety disorders, such as panic disorder, social anxiety disorder, generalized anxiety disorder, and phobias. Anxiety disorders cause excessive or uncontrollable anxiety. They may interfere with your relationships or other life activities.  Panic attacks are sometimes seen with other mental illnesses, such as depression and posttraumatic stress disorder.  Certain medical conditions, prescription medicines, and drugs of abuse can cause panic attacks. SYMPTOMS  Panic attacks start suddenly, peak within 20 minutes, and are accompanied by four or more of the following symptoms:  Pounding heart or fast heart rate (palpitations).  Sweating.  Trembling or shaking.  Shortness of breath or feeling smothered.  Feeling choked.  Chest pain or discomfort.  Nausea or strange feeling in your stomach.  Dizziness, light-headedness, or feeling like you will faint.  Chills or hot flushes.  Numbness or tingling in your lips or hands and feet.  Feeling that things are not real or feeling that you are not yourself.  Fear of losing control or going crazy.  Fear of dying. Some of these symptoms can mimic serious medical conditions. For example, you may think you are having a heart attack. Although panic attacks can be very scary, they are not life threatening. DIAGNOSIS  Panic attacks are diagnosed through an assessment by your health care provider. Your health care provider will ask  questions about your symptoms, such as where and when they occurred. Your health care provider will also ask about your medical history and use of alcohol and drugs, including prescription medicines. Your health care provider may order blood tests or other studies to rule out a serious medical condition. Your health care provider may refer you to a mental health professional for further evaluation. TREATMENT   Most healthy people who have one or two panic attacks in an extreme, life-threatening situation will not require treatment.  The treatment for panic attacks associated with anxiety disorders or other mental illness typically involves counseling with a mental health professional, medicine, or a combination of both. Your health care provider will help determine what treatment is best for you.  Panic attacks due to physical illness usually go away with treatment of the illness. If prescription medicine is causing panic attacks, talk with your health care provider about stopping the medicine, decreasing the dose, or substituting another medicine.  Panic attacks due to alcohol or drug abuse go away with abstinence. Some adults need professional help in order to stop drinking or using drugs. HOME CARE INSTRUCTIONS   Take all medicines as directed by your health care provider.   Schedule and attend follow-up visits as directed by your health care provider. It is important to keep all your appointments. SEEK MEDICAL CARE IF:  You are not able to take your medicines as prescribed.  Your symptoms do not improve or get worse. SEEK IMMEDIATE MEDICAL CARE IF:   You experience panic attack symptoms that are different than your usual symptoms.  You have serious thoughts about hurting yourself or others.  You are taking medicine for panic attacks and   have a serious side effect. MAKE SURE YOU:  Understand these instructions.  Will watch your condition.  Will get help right away if you are not  doing well or get worse. Document Released: 05/26/2005 Document Revised: 05/31/2013 Document Reviewed: 01/07/2013 Exit[employer] Patient Information 2015 [employer]. This information is not intended to replace advice given to you by your health care provider. Make sure you discuss any questions you have with your health care provider.  Generalized Anxiety Disorder Generalized anxiety disorder (GAD) is a mental disorder. It interferes with life functions, including relationships, work, and school. GAD is different from normal anxiety, which everyone experiences at some point in their lives in response to specific life events and activities. Normal anxiety actually helps us prepare for and get through these life events and activities. Normal anxiety goes away after the event or activity is over.  GAD causes anxiety that is not necessarily related to specific events or activities. It also causes excess anxiety in proportion to specific events or activities. The anxiety associated with GAD is also difficult to control. GAD can vary from mild to severe. People with severe GAD can have intense waves of anxiety with physical symptoms (panic attacks).  SYMPTOMS The anxiety and worry associated with GAD are difficult to control. This anxiety and worry are related to many life events and activities and also occur more days than not for 6 months or longer. People with GAD also have three or more of the following symptoms (one or more in children):  Restlessness.   Fatigue.  Difficulty concentrating.   Irritability.  Muscle tension.  Difficulty sleeping or unsatisfying sleep. DIAGNOSIS GAD is diagnosed through an assessment by your health care provider. Your health care provider will ask you questions aboutyour mood,physical symptoms, and events in your life. Your health care provider may ask you about your medical history and use of alcohol or drugs, including prescription medicines. Your health care  provider may also do a physical exam and blood tests. Certain medical conditions and the use of certain substances can cause symptoms similar to those associated with GAD. Your health care provider may refer you to a mental health specialist for further evaluation. TREATMENT The following therapies are usually used to treat GAD:   Medication. Antidepressant medication usually is prescribed for long-term daily control. Antianxiety medicines may be added in severe cases, especially when panic attacks occur.   Talk therapy (psychotherapy). Certain types of talk therapy can be helpful in treating GAD by providing support, education, and guidance. A form of talk therapy called cognitive behavioral therapy can teach you healthy ways to think about and react to daily life events and activities.  Stress managementtechniques. These include yoga, meditation, and exercise and can be very helpful when they are practiced regularly. A mental health specialist can help determine which treatment is best for you. Some people see improvement with one therapy. However, other people require a combination of therapies. Document Released: 09/20/2012 Document Revised: 10/10/2013 Document Reviewed: 09/20/2012 Exit[employer] Patient Information 2015 [employer]. This information is not intended to replace advice given to you by your health care provider. Make sure you discuss any questions you have with your health care provider.  

## 2014-12-26 NOTE — ED Provider Notes (Signed)
[facility] Emergency Department Provider Note  ____________________________________________  Time seen: 5:00 AM  I have reviewed the triage vital signs and the nursing notes.   HISTORY  Chief Complaint Panic Attack     HPI Martin Riddle is a 44 y.o. male presents with worsening anxiety 2 weeks. Patient admits to previous episodes of anxiety and panic attacks but states that over the past 2 weeks that is worsened considerably. Patient also admits to insomnia patient denies any suicidal or homicidal ideations. Patient states that he currently takes Xanax 0.25 mg for anxiety but that has not improved his symptoms.    Past Medical History  Diagnosis Date  . Diabetes mellitus without complication   . Hypertension   . Hypercholesteremia     There are no active problems to display for this patient.   History reviewed. No pertinent past surgical history.  No current outpatient prescriptions on file.  Allergies Morphine and related  No family history on file.  Social History History  Substance Use Topics  . Smoking status: Never Smoker   . Smokeless tobacco: Not on file  . Alcohol Use: No    Review of Systems  Constitutional: Negative for fever. Eyes: Negative for visual changes. ENT: Negative for sore throat. Cardiovascular: Negative for chest pain. Respiratory: Negative for shortness of breath. Gastrointestinal: Negative for abdominal pain, vomiting and diarrhea. Genitourinary: Negative for dysuria. Musculoskeletal: Negative for back pain. Skin: Negative for rash. Neurological: Negative for headaches, focal weakness or numbness. Psychiatry: Panic attack and anxiety  10-point ROS otherwise negative.  ____________________________________________   PHYSICAL EXAM:  VITAL SIGNS: ED Triage Vitals  Enc Vitals Group     BP 12/26/14 0416 179/101 mmHg     Pulse Rate 12/26/14 0416 96     Resp 12/26/14 0416 24     Temp 12/26/14 0416  98.6 F (37 C)     Temp Source 12/26/14 0416 Oral     SpO2 12/26/14 0416 95 %     Weight 12/26/14 0416 270 lb (122.471 kg)     Height 12/26/14 0416  (1.93 m)     Head Cir --      Peak Flow --      Pain Score 12/26/14 0417 0     Pain Loc --      Pain Edu? --      Excl. in GC? --      Constitutional: Alert and oriented. Anxious affect   Head: Normocephalic and atraumatic.   Nose: No congestion/rhinnorhea.   Mouth/Throat: Mucous membranes are moist.   Neck: No stridor. Hematological/Lymphatic/Immunilogical: No cervical lymphadenopathy. Cardiovascular: Normal rate, regular rhythm. Normal and symmetric distal pulses are present in all extremities. No murmurs, rubs, or gallops. Respiratory: Normal respiratory effort without tachypnea nor retractions. Breath sounds are clear and equal bilaterally. No wheezes/rales/rhonchi. Gastrointestinal: Soft and nontender. No distention. There is no CVA tenderness. Genitourinary: deferred Musculoskeletal: Nontender with normal range of motion in all extremities. No joint effusions.  No lower extremity tenderness nor edema. Neurologic:  Normal speech and language. No gross focal neurologic deficits are appreciated. Speech is normal.  Skin:  Skin is warm, dry and intact. No rash noted. Psychiatric: anxious affect. Speech and behavior are normal. Patient exhibits appropriate insight and judgment.     INITIAL IMPRESSION / ASSESSMENT AND PLAN / ED COURSE  Pertinent labs & imaging results that were available during my care of the patient were reviewed by me and considered in my medical decision making (  see chart for details).  History of physical exam consistent with anxiety/panic attacks as such patient received Ativan 2 mg by mouth. Patient is currently taking OxyContin and oxycodone and Xanax as such no benzodiazepine was prescribed from the emergency department. Patient was advised to follow-up with primary care provider for SSRI  therapy  ____________________________________________   FINAL CLINICAL IMPRESSION(S) / ED DIAGNOSES  Final diagnoses:  Panic attack  Anxiety  Generalized anxiety disorder      Darci Currentandolph N Bartt Gonzaga, MD 12/28/14 2317

## 2014-12-26 NOTE — ED Notes (Signed)
Patient discharged to home per MD order. Patient in stable condition, and deemed medically cleared by ED provider for discharge. Discharge instructions reviewed with patient/family using "Teach Back"; verbalized understanding of medication education and administration, and information about follow-up care. Denies further concerns. ° °

## 2014-12-26 NOTE — ED Notes (Signed)
Patient presents with reports of be anxious. Patient states, "I have been dealing with this for over 2 weeks." Patient hyperventilating and reporting that it feels like the rooms is closing in on him. (+) insomnia reported.

## 2015-01-16 DIAGNOSIS — G63 Polyneuropathy in diseases classified elsewhere: Secondary | ICD-10-CM

## 2015-01-16 DIAGNOSIS — M5136 Other intervertebral disc degeneration, lumbar region: Secondary | ICD-10-CM | POA: Insufficient documentation

## 2015-01-16 DIAGNOSIS — E119 Type 2 diabetes mellitus without complications: Secondary | ICD-10-CM | POA: Insufficient documentation

## 2015-01-16 DIAGNOSIS — K219 Gastro-esophageal reflux disease without esophagitis: Secondary | ICD-10-CM | POA: Insufficient documentation

## 2015-01-16 DIAGNOSIS — E7800 Pure hypercholesterolemia, unspecified: Secondary | ICD-10-CM | POA: Insufficient documentation

## 2015-01-16 DIAGNOSIS — G629 Polyneuropathy, unspecified: Secondary | ICD-10-CM | POA: Insufficient documentation

## 2015-01-16 DIAGNOSIS — Q6650 Congenital pes planus, unspecified foot: Secondary | ICD-10-CM | POA: Insufficient documentation

## 2015-01-27 ENCOUNTER — Inpatient Hospital Stay
Admission: EM | Admit: 2015-01-27 | Discharge: 2015-01-30 | DRG: 439 | Disposition: A | Discharge status: Home / Self Care | Payer: Medicare Other | Attending: Internal Medicine | Admitting: Internal Medicine

## 2015-01-27 ENCOUNTER — Encounter: Payer: Self-pay | Admitting: Emergency Medicine

## 2015-01-27 DIAGNOSIS — E1165 Type 2 diabetes mellitus with hyperglycemia: Secondary | ICD-10-CM | POA: Diagnosis present

## 2015-01-27 DIAGNOSIS — Z79891 Long term (current) use of opiate analgesic: Secondary | ICD-10-CM | POA: Diagnosis not present

## 2015-01-27 DIAGNOSIS — R0902 Hypoxemia: Secondary | ICD-10-CM | POA: Diagnosis present

## 2015-01-27 DIAGNOSIS — E871 Hypo-osmolality and hyponatremia: Secondary | ICD-10-CM | POA: Diagnosis present

## 2015-01-27 DIAGNOSIS — Z79899 Other long term (current) drug therapy: Secondary | ICD-10-CM

## 2015-01-27 DIAGNOSIS — I10 Essential (primary) hypertension: Secondary | ICD-10-CM | POA: Diagnosis present

## 2015-01-27 DIAGNOSIS — E781 Pure hyperglyceridemia: Secondary | ICD-10-CM | POA: Diagnosis present

## 2015-01-27 DIAGNOSIS — Z794 Long term (current) use of insulin: Secondary | ICD-10-CM | POA: Diagnosis not present

## 2015-01-27 DIAGNOSIS — K859 Acute pancreatitis, unspecified: Secondary | ICD-10-CM | POA: Diagnosis present

## 2015-01-27 DIAGNOSIS — R079 Chest pain, unspecified: Secondary | ICD-10-CM | POA: Diagnosis present

## 2015-01-27 DIAGNOSIS — Z885 Allergy status to narcotic agent status: Secondary | ICD-10-CM

## 2015-01-27 DIAGNOSIS — E780 Pure hypercholesterolemia: Secondary | ICD-10-CM | POA: Diagnosis present

## 2015-01-27 HISTORY — DX: Acute pancreatitis without necrosis or infection, unspecified: K85.90

## 2015-01-27 LAB — BASIC METABOLIC PANEL
BUN: 17 mg/dL (ref 6–20)
CHLORIDE: 98 mmol/L — AB (ref 101–111)
CO2: 18 mmol/L — ABNORMAL LOW (ref 22–32)
Calcium: 8.9 mg/dL (ref 8.9–10.3)
Creatinine, Ser: 0.84 mg/dL (ref 0.61–1.24)
GFR calc non Af Amer: >60 mL/min (ref >60)
Glucose, Bld: 193 mg/dL — ABNORMAL HIGH (ref 65–99)
POTASSIUM: 5 mmol/L (ref 3.5–5.1)

## 2015-01-27 LAB — URINALYSIS COMPLETE WITH MICROSCOPIC (ARMC ONLY)
BILIRUBIN URINE: NEGATIVE
Bacteria, UA: NONE SEEN
HGB URINE DIPSTICK: NEGATIVE
LEUKOCYTES UA: NEGATIVE
NITRITE: NEGATIVE
PH: 5 (ref 5.0–8.0)
Protein, ur: NEGATIVE mg/dL
RBC / HPF: NONE SEEN RBC/hpf (ref 0–5)
SPECIFIC GRAVITY, URINE: 1.027 (ref 1.005–1.030)
WBC, UA: NONE SEEN WBC/hpf (ref 0–5)

## 2015-01-27 LAB — CBC WITH DIFFERENTIAL/PLATELET
BASOS ABS: 0.1 10*3/uL (ref 0–0.1)
BASOS PCT: 1 %
EOS ABS: 0 10*3/uL (ref 0–0.7)
EOS PCT: 0 %
HCT: 46.9 % (ref 40.0–52.0)
Hemoglobin: 16.4 g/dL (ref 13.0–18.0)
LYMPHS PCT: 11 %
Lymphs Abs: 1.4 10*3/uL (ref 1.0–3.6)
MCH: 32.2 pg (ref 26.0–34.0)
MCHC: 35 g/dL (ref 32.0–36.0)
MCV: 92 fL (ref 80.0–100.0)
Monocytes Absolute: 0.8 10*3/uL (ref 0.2–1.0)
Monocytes Relative: 6 %
Neutro Abs: 11.2 10*3/uL — ABNORMAL HIGH (ref 1.4–6.5)
Neutrophils Relative %: 82 %
PLATELETS: 244 10*3/uL (ref 150–440)
RBC: 5.1 MIL/uL (ref 4.40–5.90)
RDW: 12.8 % (ref 11.5–14.5)
WBC: 13.5 10*3/uL — AB (ref 3.8–10.6)

## 2015-01-27 LAB — GLUCOSE, CAPILLARY
GLUCOSE-CAPILLARY: 165 mg/dL — AB (ref 65–99)
GLUCOSE-CAPILLARY: 168 mg/dL — AB (ref 65–99)
GLUCOSE-CAPILLARY: 185 mg/dL — AB (ref 65–99)
GLUCOSE-CAPILLARY: 196 mg/dL — AB (ref 65–99)
Glucose-Capillary: 257 mg/dL — ABNORMAL HIGH (ref 65–99)
Glucose-Capillary: 235 mg/dL — ABNORMAL HIGH (ref 65–99)
Glucose-Capillary: 206 mg/dL — ABNORMAL HIGH (ref 65–99)
Glucose-Capillary: 192 mg/dL — ABNORMAL HIGH (ref 65–99)
Glucose-Capillary: 188 mg/dL — ABNORMAL HIGH (ref 65–99)
Glucose-Capillary: 185 mg/dL — ABNORMAL HIGH (ref 65–99)
Glucose-Capillary: 194 mg/dL — ABNORMAL HIGH (ref 65–99)

## 2015-01-27 LAB — COMPREHENSIVE METABOLIC PANEL
ALT: 37 U/L (ref 17–63)
Albumin: 4.3 g/dL (ref 3.5–5.0)
Alkaline Phosphatase: 54 U/L (ref 38–126)
Anion gap: 14 (ref 5–15)
BILIRUBIN TOTAL: 0.9 mg/dL (ref 0.3–1.2)
BUN: 22 mg/dL — ABNORMAL HIGH (ref 6–20)
CHLORIDE: 96 mmol/L — AB (ref 101–111)
CO2: 21 mmol/L — ABNORMAL LOW (ref 22–32)
CREATININE: 0.96 mg/dL (ref 0.61–1.24)
Calcium: 9.4 mg/dL (ref 8.9–10.3)
Glucose, Bld: 286 mg/dL — ABNORMAL HIGH (ref 65–99)
POTASSIUM: 4.7 mmol/L (ref 3.5–5.1)
Sodium: 131 mmol/L — ABNORMAL LOW (ref 135–145)
TOTAL PROTEIN: 7.1 g/dL (ref 6.5–8.1)

## 2015-01-27 LAB — TSH: TSH: 0.512 u[IU]/mL (ref 0.350–4.500)

## 2015-01-27 LAB — LIPID PANEL
Cholesterol: 528 mg/dL — ABNORMAL HIGH (ref 0–200)
LDL Cholesterol: UNDETERMINED mg/dL (ref 0–99)
Triglycerides: >5000 mg/dL — ABNORMAL HIGH (ref <150)
VLDL: UNDETERMINED mg/dL (ref 0–40)

## 2015-01-27 LAB — LIPASE, BLOOD: LIPASE: 1278 U/L — AB (ref 22–51)

## 2015-01-27 LAB — BETA-HYDROXYBUTYRIC ACID: BETA-HYDROXYBUTYRIC ACID: 3.7 mmol/L — AB (ref 0.05–0.27)

## 2015-01-27 LAB — HEMOGLOBIN A1C: HEMOGLOBIN A1C: 11.4 % — AB (ref 4.0–6.0)

## 2015-01-27 LAB — TROPONIN I: Troponin I: <0.03 ng/mL (ref <0.031)

## 2015-01-27 LAB — MAGNESIUM: MAGNESIUM: 2.2 mg/dL (ref 1.7–2.4)

## 2015-01-27 LAB — MRSA PCR SCREENING: MRSA BY PCR: NEGATIVE

## 2015-01-27 MED ORDER — OXYCODONE HCL 5 MG PO TABS: 30.0000 mg | ORAL_TABLET | ORAL | Status: DC | PRN

## 2015-01-27 MED ORDER — PROMETHAZINE HCL 25 MG/ML IJ SOLN
25.0000 mg | Freq: Four times a day (QID) | INTRAMUSCULAR | Status: DC | PRN
  Administered 2015-01-27 – 2015-01-30 (×6): 25 mg via INTRAVENOUS
  Filled 2015-01-27 (×6): qty 1

## 2015-01-27 MED ORDER — LISINOPRIL 20 MG PO TABS
40.0000 mg | ORAL_TABLET | Freq: Every day | ORAL | Status: DC
  Administered 2015-01-27: 40 mg via ORAL
  Filled 2015-01-27: qty 2

## 2015-01-27 MED ORDER — SODIUM CHLORIDE 0.9 % IV SOLN: INTRAVENOUS | Status: DC

## 2015-01-27 MED ORDER — HYDROMORPHONE HCL 1 MG/ML IJ SOLN
1.0000 mg | Freq: Once | INTRAMUSCULAR | Status: DC
  Filled 2015-01-27: qty 1

## 2015-01-27 MED ORDER — SODIUM CHLORIDE 0.9 % IV SOLN: INTRAVENOUS | Status: AC

## 2015-01-27 MED ORDER — PREGABALIN 50 MG PO CAPS
50.0000 mg | ORAL_CAPSULE | Freq: Three times a day (TID) | ORAL | Status: DC
  Administered 2015-01-27 – 2015-01-30 (×8): 50 mg via ORAL
  Filled 2015-01-27 (×2): qty 2
  Filled 2015-01-27 (×3): qty 1
  Filled 2015-01-27: qty 2
  Filled 2015-01-27 (×2): qty 1

## 2015-01-27 MED ORDER — HYDROMORPHONE HCL 1 MG/ML IJ SOLN
1.0000 mg | INTRAMUSCULAR | Status: DC | PRN
  Administered 2015-01-27 – 2015-01-29 (×10): 1 mg via INTRAVENOUS
  Filled 2015-01-27 (×9): qty 1

## 2015-01-27 MED ORDER — HEPARIN SODIUM (PORCINE) 5000 UNIT/ML IJ SOLN
5000.0000 [IU] | Freq: Three times a day (TID) | INTRAMUSCULAR | Status: DC
  Administered 2015-01-27 – 2015-01-30 (×8): 5000 [IU] via SUBCUTANEOUS
  Filled 2015-01-27 (×8): qty 1

## 2015-01-27 MED ORDER — HYDROMORPHONE HCL 1 MG/ML IJ SOLN
1.0000 mg | Freq: Once | INTRAMUSCULAR | Status: AC
  Administered 2015-01-27: 1 mg via INTRAVENOUS
  Filled 2015-01-27: qty 1

## 2015-01-27 MED ORDER — PROMETHAZINE HCL 25 MG PO TABS
25.0000 mg | ORAL_TABLET | ORAL | Status: DC | PRN
  Filled 2015-01-27: qty 1

## 2015-01-27 MED ORDER — DEXTROSE-NACL 5-0.45 % IV SOLN
INTRAVENOUS | Status: DC
  Administered 2015-01-27 – 2015-01-28 (×2): via INTRAVENOUS

## 2015-01-27 MED ORDER — INSULIN ASPART 100 UNIT/ML ~~LOC~~ SOLN
0.0000 [IU] | Freq: Three times a day (TID) | SUBCUTANEOUS | Status: DC
Start: 2015-01-28 — End: 2015-01-27

## 2015-01-27 MED ORDER — OMEGA-3-ACID ETHYL ESTERS 1 G PO CAPS
1.0000 g | ORAL_CAPSULE | Freq: Two times a day (BID) | ORAL | Status: DC
  Administered 2015-01-27 – 2015-01-30 (×6): 1 g via ORAL
  Filled 2015-01-27 (×9): qty 1

## 2015-01-27 MED ORDER — OXYCODONE HCL ER 20 MG PO T12A
40.0000 mg | EXTENDED_RELEASE_TABLET | Freq: Two times a day (BID) | ORAL | Status: DC
  Administered 2015-01-27 – 2015-01-30 (×6): 40 mg via ORAL
  Filled 2015-01-27: qty 4
  Filled 2015-01-27 (×2): qty 2
  Filled 2015-01-27: qty 4
  Filled 2015-01-27 (×2): qty 2

## 2015-01-27 MED ORDER — LORAZEPAM 2 MG PO TABS
2.0000 mg | ORAL_TABLET | Freq: Every day | ORAL | Status: DC
  Administered 2015-01-27 – 2015-01-29 (×3): 2 mg via ORAL
  Filled 2015-01-27 (×3): qty 1

## 2015-01-27 MED ORDER — INSULIN ASPART 100 UNIT/ML ~~LOC~~ SOLN
0.0000 [IU] | Freq: Every day | SUBCUTANEOUS | Status: DC
Start: 2015-01-27 — End: 2015-01-27

## 2015-01-27 MED ORDER — SODIUM CHLORIDE 0.9 % IV SOLN
INTRAVENOUS | Status: DC
  Administered 2015-01-27: 2 [IU]/h via INTRAVENOUS
  Administered 2015-01-27: 2.7 [IU]/h via INTRAVENOUS
  Administered 2015-01-27: 1.3 [IU]/h via INTRAVENOUS
  Filled 2015-01-27: qty 2.5

## 2015-01-27 MED ORDER — SODIUM CHLORIDE 0.9 % IV SOLN
INTRAVENOUS | Status: DC
  Administered 2015-01-27: 21:00:00 via INTRAVENOUS

## 2015-01-27 MED ORDER — PROMETHAZINE HCL 25 MG PO TABS
12.5000 mg | ORAL_TABLET | Freq: Four times a day (QID) | ORAL | Status: DC | PRN
  Filled 2015-01-27: qty 1

## 2015-01-27 MED ORDER — SODIUM CHLORIDE 0.9 % IV BOLUS (SEPSIS)
1000.0000 mL | Freq: Once | INTRAVENOUS | Status: AC
  Administered 2015-01-27: 1000 mL via INTRAVENOUS

## 2015-01-27 MED ORDER — POTASSIUM CHLORIDE 10 MEQ/100ML IV SOLN
10.0000 meq | INTRAVENOUS | Status: AC
  Administered 2015-01-27 (×2): 10 meq via INTRAVENOUS
  Filled 2015-01-27 (×2): qty 100

## 2015-01-27 MED ORDER — SODIUM CHLORIDE 0.9 % IV SOLN
Freq: Once | INTRAVENOUS | Status: AC
  Administered 2015-01-27: 10:00:00 via INTRAVENOUS

## 2015-01-27 MED ORDER — ONDANSETRON HCL 4 MG/2ML IJ SOLN
4.0000 mg | Freq: Once | INTRAMUSCULAR | Status: AC
  Administered 2015-01-27: 4 mg via INTRAVENOUS
  Filled 2015-01-27: qty 2

## 2015-01-27 MED ORDER — ACETAMINOPHEN 325 MG PO TABS
325.0000 mg | ORAL_TABLET | Freq: Four times a day (QID) | ORAL | Status: DC | PRN
  Administered 2015-01-28 – 2015-01-30 (×4): 325 mg via ORAL
  Filled 2015-01-27 (×4): qty 1

## 2015-01-27 NOTE — H&P (Signed)
[facility] [place] [facility]   PATIENT NAME: Martin Riddle    MR#:  [id number]  DATE OF BIRTH:  06/23/1970  DATE OF ADMISSION:  01/27/2015  PRIMARY CARE PHYSICIAN: Dortha Kern, PA   REQUESTING/REFERRING PHYSICIAN:   CHIEF COMPLAINT:   Chief Complaint  Patient presents with  . Abdominal Pain    awoke him this am, history of pancreatitis   The patient is 44 year old Caucasian male with past medical history significant for diabetes related acute pancreatitis, history of diabetes, on insulin therapy, hypertension, hyperlipidemia who does not have primary care physician presents to the hospital with complaints of severe abdominal pain. Abdominal pain started the last night in the upper abdomen, sharp to achy, constant pain, 9 to 10 out of 10 by intensity. There was no significant radiation, accompanied by nausea and vomiting. In emergency room, patient received 2 doses of Dilaudid without significant improvement of increased pain. His lipase level was found to be above 1000 and hospitalist services were contacted for admission. Patient's blood glucose level was also found to be high at 300s HISTORY OF PRESENT ILLNESS: Martin Riddle  is a 44 y.o. male with a known history of diabetes related acute pancreatitis, history of diabetes, on insulin therapy, hypertension, hyperlipidemia who does not have primary care physician presents to the hospital with complaints of severe abdominal pain. Abdominal pain started the last night in the upper abdomen, sharp to achy, constant pain, 9 to 10 out of 10 by intensity. There was no significant radiation, accompanied by nausea and vomiting. In emergency room, patient received 2 doses of Dilaudid without significant improvement of increased pain. His lipase level was found to be above 1000 and hospitalist services were contacted for admission. Patient's blood glucose level was also found to be high at 300s   PAST MEDICAL HISTORY:    Past Medical History  Diagnosis Date  . Diabetes mellitus without complication   . Hypertension   . Hypercholesteremia   . Pancreatitis     PAST SURGICAL HISTORY:  Past Surgical History  Procedure Laterality Date  . Back surgery  2003,2012  . Ankle surgery Left 2001,2003    SOCIAL HISTORY:  Social History  Substance Use Topics  . Smoking status: Former Smoker -- 7 years  . Smokeless tobacco: Current User  . Alcohol Use: No    FAMILY HISTORY:  Family History  Problem Relation Age of Onset  . Heart disease Mother   . Diabetes Mother   . Diabetes Sister     DRUG ALLERGIES:  Allergies  Allergen Reactions  . Morphine And Related Other (See Comments)    headache    Review of Systems  Unable to perform ROS: acuity of condition  Constitutional: Negative for fever, chills, weight loss and malaise/fatigue.  HENT: Negative for congestion.   Eyes: Negative for blurred vision and double vision.  Respiratory: Negative for cough, sputum production, shortness of breath and wheezing.   Cardiovascular: Negative for chest pain, palpitations, orthopnea, leg swelling and PND.  Gastrointestinal: Positive for nausea, vomiting and abdominal pain. Negative for diarrhea, constipation, blood in stool and melena.  Genitourinary: Negative for dysuria, urgency, frequency and hematuria.  Musculoskeletal: Negative for falls.  Skin: Negative for rash.  Neurological: Negative for dizziness and weakness.  Psychiatric/Behavioral: Negative for depression and memory loss. The patient is not nervous/anxious.     MEDICATIONS AT HOME:  Prior to Admission medications   Medication Sig Start Date End Date Taking? Authorizing Provider  acetaminophen (  TYLENOL) 325 MG tablet Take 325 mg by mouth every 6 (six) hours as needed for mild pain or moderate pain.    Yes Historical Provider, MD  glipiZIDE (GLUCOTROL XL) 10 MG 24 hr tablet TK 1 T PO QD 12/14/14  Yes Historical Provider, MD  INVOKAMET [phone] MG  TABS TK 1 T PO BID 12/24/14  Yes Historical Provider, MD  LEVEMIR 100 UNIT/ML injection INJECT 28 UNITS SQ BID 12/24/14  Yes Historical Provider, MD  lisinopril (PRINIVIL,ZESTRIL) 40 MG tablet TK 1 T PO QD 12/14/14  Yes Historical Provider, MD  LORazepam (ATIVAN) 2 MG tablet Take 2 mg by mouth at bedtime. 01/05/15  Yes Historical Provider, MD  LYRICA 50 MG capsule TK 1 C PO TID 12/19/14  Yes Historical Provider, MD  oxycodone (ROXICODONE) 30 MG immediate release tablet TK 1 T PO Q 3 H 12/23/14  Yes Historical Provider, MD  OXYCONTIN 40 MG T12A 12 hr tablet TK 1 T PO Q 6  H 12/23/14  Yes Historical Provider, MD  polyethylene glycol powder (GLYCOLAX/MIRALAX) powder MIX 17 GRAMS WITH 8 OUNCES OF FLUID AND DRINK ONCE DAILY 11/17/14  Yes Historical Provider, MD  promethazine (PHENERGAN) 25 MG tablet TK 1 T PO Q 6 H PRF NAUSEA 10/12/14  Yes Historical Provider, MD      PHYSICAL EXAMINATION:   VITAL SIGNS: Blood pressure 151/83, pulse 86, temperature 98.2 F (36.8 C), temperature source Oral, resp. rate 27, height 6\' 4"  (1.93 m), weight 120.203 kg (265 lb), SpO2 90 %.  GENERAL:  44 y.o.-year-old patient lying in the bed in moderate distress. Ringing in the bed because of pain and nauseated, retching EYES: Pupils equal, round, reactive to light and accommodation. No scleral icterus. Extraocular muscles intact.  HEENT: Head atraumatic, normocephalic. Oropharynx and nasopharynx clear.  NECK:  Supple, no jugular venous distention. No thyroid enlargement, no tenderness.  LUNGS: Normal breath sounds bilaterally, no wheezing, rales,rhonchi or crepitation. No use of accessory muscles of respiration.  CARDIOVASCULAR: S1, S2 normal. No murmurs, rubs, or gallops.  ABDOMEN: Soft, voluntary guarding was noted to the upper abdomen, abdomen was tender to touch, nondistended. Bowel sounds present. No organomegaly or mass.  EXTREMITIES: No pedal edema, cyanosis, or clubbing.  NEUROLOGIC: Cranial nerves II through XII are  intact. Muscle strength 5/5 in all extremities. Sensation intact. Gait not checked.  PSYCHIATRIC: The patient is alert and oriented x 3.  SKIN: No obvious rash, lesion, or ulcer.   LABORATORY PANEL:   CBC  Recent Labs Lab 01/27/15 0837  WBC 13.5*  HGB 16.4  HCT 46.9  PLT 244  MCV 92.0  MCH 32.2  MCHC 35.0  RDW 12.8  LYMPHSABS 1.4  MONOABS 0.8  EOSABS 0.0  BASOSABS 0.1   ------------------------------------------------------------------------------------------------------------------  Chemistries   Recent Labs Lab 01/27/15 0837  NA 131*  K 4.7  CL 96*  CO2 21*  GLUCOSE 286*  BUN 22*  CREATININE 0.96  CALCIUM 9.4  AST SEE COMMENTS  ALT 37  ALKPHOS 54  BILITOT 0.9   ------------------------------------------------------------------------------------------------------------------  Cardiac Enzymes  Recent Labs Lab 01/27/15 0837  TROPONINI <0.03   ------------------------------------------------------------------------------------------------------------------  RADIOLOGY: No results found.  EKG: Orders placed or performed in visit on 03/10/14  . EKG 12-Lead    IMPRESSION AND PLAN:  Principal Problem:   Acute pancreatitis Active Problems:   Hyponatremia   Hyperglycemia   Leukocytosis 1. Acute pancreatitis likely due to hyper lipidemia, and lipid panel. Start patient on insulin IV drip, continue IV fluids as  well as pain medications. Keep patient nothing by mouth, get GI involved 2. Hyponatremia. Continue IV fluids. Follow sodium level later 3. Diabetes mellitus. Check hemoglobin A1c, initiate patient on insulin IV drip and since patient is likely in mild DKA as judged by his slightly lower bicarbonate level of 21. Anion gap, however, was normal. Check beta- hydroxybutyric acid level 4. Hypertension, poorly controlled, likely due to severe pain. Continue outpatient medications. Advance him as needed   All the records are reviewed and case discussed  with ED provider. Management plans discussed with the patient, family and they are in agreement.  CODE STATUS:    TOTAL TIME TAKING CARE OF THIS PATIENT: 60  minutes.    Katharina Caper M.D on 01/27/2015 at 10:48 AM  Between 7am to 6pm - Pager - [phone] After 6pm go to [email or web address] - password [email or web address] [facility]  [facility] [place] [facility]  Office  [phone]  CC: Primary care physician; Dortha Kern, PA

## 2015-01-27 NOTE — Progress Notes (Signed)
Dr Winona Legato notified of recent MetB. Per MD, discontinue MetB q4h, patient is not on DKA conversion protocol and is not to be converted from insulin drip until reevaluation of triglyceride level tomorrow am. Enriqueta Shutter modified to refect MD order. Patient from ER resting quietly at this time. Report 8/10 abdominal pain, snoring mid-conversation with RN. Will continue to monitor.

## 2015-01-27 NOTE — ED Notes (Signed)
Pt removed BP cuff, O2 monitor, and EKG leads.

## 2015-01-27 NOTE — ED Notes (Signed)
Pt's CBG 235, insuling increased to 3.3 units/hr.

## 2015-01-27 NOTE — ED Provider Notes (Signed)
[facility] Emergency Department Provider Note  Time seen: 8:11 AM  I have reviewed the triage vital signs and the nursing notes.   HISTORY  Chief Complaint Abdominal Pain    HPI Martin Riddle is a 44 y.o. male with a past medical history of diabetes, hypertension, hyperlipidemia who presents the emergency department up her abdominal pain since 2 AM. According to the patient he was awoken around 2 AM with upper abdominal pain. Patient states a history of similar pain back in October when he was diagnosed with pancreatitis due to uncontrolled diabetes. He states for the past one month he has had blood sugars in the 4-500 range. States she has been taking his medications as prescribed, but they are not working. Denies any chest pain, trouble breathing. Patient does state nausea, denies vomiting. Denies black or bloody stool. No modifying factors yet identified.     Past Medical History  Diagnosis Date  . Diabetes mellitus without complication   . Hypertension   . Hypercholesteremia   . Pancreatitis     Patient Active Problem List   Diagnosis Date Noted  . Arthritis 01/16/2015  . Congenital flat foot 01/16/2015  . DDD (degenerative disc disease), lumbar 01/16/2015  . Diabetes mellitus, type 2 01/16/2015  . Acid reflux 01/16/2015  . Hypercholesteremia 01/16/2015  . Gastro-esophageal reflux disease without esophagitis 01/16/2015  . Secondary peripheral neuropathy 01/16/2015  . Type 2 diabetes mellitus with hyperglycemia 01/16/2015  . Hypertriglyceridemia 05/23/2014  . Long term current use of insulin 05/23/2014    Past Surgical History  Procedure Laterality Date  . Back surgery  2003,2012  . Ankle surgery Left 2001,2003    Current Outpatient Rx  Name  Route  Sig  Dispense  Refill  . ALPRAZolam (XANAX) 0.5 MG tablet      TK 1 T PO QHS      0   . glipiZIDE (GLUCOTROL XL) 10 MG 24 hr tablet      TK 1 T PO QD      10   . INVOKAMET [phone]  MG TABS      TK 1 T PO BID      0     Dispense as written.   Marland Kitchen LEVEMIR 100 UNIT/ML injection      INJECT 28 UNITS SQ BID      0     Dispense as written.   Marland Kitchen lisinopril (PRINIVIL,ZESTRIL) 40 MG tablet      TK 1 T PO QD      5   . LYRICA 50 MG capsule      TK 1 C PO TID      0     Dispense as written.   . morphine (MS CONTIN) 100 MG 12 hr tablet      TK 1 T PO Q 8 H      0   . oxycodone (ROXICODONE) 30 MG immediate release tablet      TK 1 T PO Q 3 H      0   . OXYCONTIN 40 MG T12A 12 hr tablet      TK 1 T PO Q 6  H      0     Dispense as written.   . polyethylene glycol powder (GLYCOLAX/MIRALAX) powder      MIX 17 GRAMS WITH 8 OUNCES OF FLUID AND DRINK ONCE DAILY      0   . promethazine (PHENERGAN) 25 MG tablet  TK 1 T PO Q 6 H PRF NAUSEA      1     Allergies Morphine and related  Family History  Problem Relation Age of Onset  . Heart disease Mother   . Diabetes Mother   . Diabetes Sister     Social History Social History  Substance Use Topics  . Smoking status: Former Smoker -- 7 years  . Smokeless tobacco: Current User  . Alcohol Use: No    Review of Systems Constitutional: Negative for fever. Cardiovascular: Negative for chest pain. Respiratory: Negative for shortness of breath. Gastrointestinal: Positive for epigastric abdominal pain, as well as nausea. Negative for vomiting. Neurological: Negative for headache 10-point ROS otherwise negative.  ____________________________________________   PHYSICAL EXAM:  VITAL SIGNS: ED Triage Vitals  Enc Vitals Group     BP 01/27/15 0743 161/119 mmHg     Pulse Rate 01/27/15 0743 98     Resp 01/27/15 0743 20     Temp 01/27/15 0743 98.2 F (36.8 C)     Temp Source 01/27/15 0743 Oral     SpO2 01/27/15 0743 97 %     Weight 01/27/15 0743 265 lb (120.203 kg)     Height 01/27/15 0743  (1.93 m)     Head Cir --      Peak Flow --      Pain Score 01/27/15 0744 10      Pain Loc --      Pain Edu? --      Excl. in GC? --     Constitutional: Alert and oriented. Mild distress due to pain. Eyes: Normal exam ENT   Head: Normocephalic and atraumatic.   Mouth/Throat: Dry mucous membranes. Cardiovascular: Normal rate, regular rhythm.  Respiratory: Normal respiratory effort without tachypnea nor retractions. Breath sounds are clear and equal bilaterally. No wheezes/rales/rhonchi. Gastrointestinal: Moderate epigastric tenderness palpation. Voluntary guarding. No rebound. No other abdominal tenderness elicited. Musculoskeletal: Nontender with normal range of motion in all extremities.  Neurologic:  Normal speech and language. No gross focal neurologic deficits  Skin:  Warm, diaphoretic. Psychiatric: Mood and affect are normal. Speech and behavior are normal.  ____________________________________________   INITIAL IMPRESSION / ASSESSMENT AND PLAN / ED COURSE  Pertinent labs & imaging results that were available during my care of the patient were reviewed by me and considered in my medical decision making (see chart for details).  Patient with acute onset of epigastric pain approximately 6 hours ago. Patient mild distress due to pain/discomfort, diaphoretic. Denies chest pain. We will check labs including lipase and troponin. We will dose pain medication, nausea medication, and begin IV hydration while awaiting laboratory results. Exam is consistent with acute pancreatitis.  Labs consistent with acute pancreatitis. We'll admit the patient for pain control, IV hydration and bowel rest.  ____________________________________________   FINAL CLINICAL IMPRESSION(S) / ED DIAGNOSES  Epigastric pain Acute pancreatitis  Minna Antis, MD 01/27/15 [phone]

## 2015-01-27 NOTE — ED Notes (Signed)
Also states blood sugar has been runninb between 400 and 600 x at least on month

## 2015-01-27 NOTE — ED Notes (Signed)
Pt's CBG 206, insulin drip increased to 4.4 units per hour.

## 2015-01-27 NOTE — Progress Notes (Signed)
MD notified to patient pain level. No new orders at this time.

## 2015-01-27 NOTE — Progress Notes (Signed)
Spoke with Dr. Anne Hahn about Pt's N/V. Thought it would be best to keep Pt on IV Phenergan because not tolerating PO. Discussed Pt's pain level despite current ordered pain medicine. Elevated vital signs are thought to be related to pain. No new orders given. Will continue to monitor

## 2015-01-27 NOTE — Consult Note (Signed)
GI Inpatient Consult Note  Reason for Consult: Pancereatitis due to hypertriglyceridemia   Attending Requesting Consult:Dr. Winona Legato  History of Present Illness: Martin Riddle is a 44 y.o. Martin Riddle has a hx of pancreatitis in October 2015 and found to have a triglyceride level of over 5000.  After discharge he had problems getting medicine because he could not find a medical provider to cover him and could not get prescriptions written per his wife.  His clinical diagnosis of diabetes was made about 2 years ago when glucose was about 600.  Past Medical History:  Past Medical History  Diagnosis Date  . Diabetes mellitus without complication   . Hypertension   . Hypercholesteremia   . Pancreatitis     Problem List: Patient Active Problem List   Diagnosis Date Noted  . Acute pancreatitis 01/27/2015  . Hyponatremia 01/27/2015  . Hyperglycemia 01/27/2015  . Leukocytosis 01/27/2015  . Arthritis 01/16/2015  . Congenital flat foot 01/16/2015  . DDD (degenerative disc disease), lumbar 01/16/2015  . Diabetes mellitus, type 2 01/16/2015  . Acid reflux 01/16/2015  . Hypercholesteremia 01/16/2015  . Gastro-esophageal reflux disease without esophagitis 01/16/2015  . Secondary peripheral neuropathy 01/16/2015  . Type 2 diabetes mellitus with hyperglycemia 01/16/2015  . Hypertriglyceridemia 05/23/2014  . Long term current use of insulin 05/23/2014    Past Surgical History: Past Surgical History  Procedure Laterality Date  . Back surgery  2003,2012  . Ankle surgery Left 2001,2003    Allergies: Allergies  Allergen Reactions  . Morphine And Related Other (See Comments)    headache    Home Medications: Prescriptions prior to admission  Medication Sig Dispense Refill Last Dose  . acetaminophen (TYLENOL) 325 MG tablet Take 325 mg by mouth every 6 (six) hours as needed for mild pain or moderate pain.    Past Week at Unknown time  . glipiZIDE (GLUCOTROL XL) 10 MG 24 hr tablet TK 1 T PO QD   10 01/26/2015 at Unknown time  . INVOKAMET [phone] MG TABS TK 1 T PO BID  0 01/26/2015 at Unknown time  . LEVEMIR 100 UNIT/ML injection INJECT 28 UNITS SQ BID  0 01/26/2015 at Unknown time  . lisinopril (PRINIVIL,ZESTRIL) 40 MG tablet TK 1 T PO QD  5 01/26/2015 at Unknown time  . LORazepam (ATIVAN) 2 MG tablet Take 2 mg by mouth at bedtime.  0 01/26/2015 at Unknown time  . LYRICA 50 MG capsule TK 1 C PO TID  0 01/26/2015 at Unknown time  . oxycodone (ROXICODONE) 30 MG immediate release tablet TK 1 T PO Q 3 H  0 01/26/2015 at Unknown time  . OXYCONTIN 40 MG T12A 12 hr tablet TK 1 T PO Q 6  H  0 01/26/2015 at Unknown time  . polyethylene glycol powder (GLYCOLAX/MIRALAX) powder MIX 17 GRAMS WITH 8 OUNCES OF FLUID AND DRINK ONCE DAILY  0 01/26/2015 at Unknown time  . promethazine (PHENERGAN) 25 MG tablet TK 1 T PO Q 6 H PRF NAUSEA  1 Past Month at Unknown time   Home medication reconciliation was completed with the patient.   Scheduled Inpatient Medications:   .  HYDROmorphone (DILAUDID) injection  1 mg Intravenous Once  . omega-3 acid ethyl esters  1 g Oral BID    Continuous Inpatient Infusions:   . sodium chloride Stopped (01/27/15 1529)  . dextrose 5 % and 0.45% NaCl 100 mL/hr at 01/27/15 1530  . insulin (NOVOLIN-R) infusion 2.7 Units/hr (01/27/15 1639)    PRN  Inpatient Medications:  HYDROmorphone (DILAUDID) injection, promethazine  Family History: family history includes Diabetes in his mother and sister; Heart disease in his mother.  The patient's family history is negative for inflammatory bowel disorders, GI malignancy, or solid organ transplantation.  Social History:   reports that he has quit smoking. He uses smokeless tobacco. He reports that he does not drink alcohol or use illicit drugs. The patient denies ETOH, tobacco, or drug use.   Review of Systems: Constitutional: Weight is stable.  Eyes: No changes in vision. ENT: No oral lesions, sore throat.  GI: see HPI.  Heme/Lymph:  No easy bruising.  CV: No chest pain.  GU: No hematuria.  Integumentary: No rashes.  Neuro: No headaches.  Psych: No depression/anxiety.  Endocrine: No heat/cold intolerance.  Allergic/Immunologic: No urticaria.  Resp: No cough, SOB.  Musculoskeletal: No joint swelling.    Physical Examination: BP 163/85 mmHg  Pulse 98  Temp(Src) 98.2 F (36.8 C) (Oral)  Resp 22  Ht  (1.93 m)  Wt 120.203 kg (265 lb)  BMI 32.27 kg/m2  SpO2 94% Gen: NAD, alert and oriented groggy from pain medicine but still able to answer questions with his wife  EOMI, Neck: supple, no JVD or thyromegaly Chest: CTA bilaterally, no wheezes, crackles, or other adventitious sounds CV: RRR, no m/g/c/r Abd: bowel sounds greatly decreased ,tender in epigastric  Area to finger percussion, no palpable masses. Ext: no edema, well perfused with 2+ pulses, Skin: no rash or lesions noted Lymph: no LAD  Data: Lab Results  Component Value Date   WBC 13.5* 01/27/2015   HGB 16.4 01/27/2015   HCT 46.9 01/27/2015   MCV 92.0 01/27/2015   PLT 244 01/27/2015    Recent Labs Lab 01/27/15 0837  HGB 16.4   Lab Results  Component Value Date   NA SEE COMMENTS 01/27/2015   K 5.0 01/27/2015   CL 98* 01/27/2015   CO2 18* 01/27/2015   BUN 17 01/27/2015   CREATININE 0.84 01/27/2015   Lab Results  Component Value Date   ALT 37 01/27/2015   AST SEE COMMENTS 01/27/2015   ALKPHOS 54 01/27/2015   BILITOT 0.9 01/27/2015   No results for input(s): APTT, INR, PTT in the last 168 hours.Triglyyceride over 5000, cholesterol 528, lipase 1278 Assessment/Plan: Martin Riddle is a 44 y.o. 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  He was ordered medicine for this but had trouble getting it refilled.  He is getting pain medicine, iv fluids, and iv insulin drip which is the best treatment for this condition .  I have nothing to add at this point, should be hydrated well to prevent pancreatic  necrosis.  Also recommend start incentive spirometry tomorrow.  Recommendations:  Thank you for the consult. Please call with questions or concerns.  Lynnae Prude, MD

## 2015-01-28 ENCOUNTER — Inpatient Hospital Stay: Payer: Medicare Other

## 2015-01-28 LAB — GLUCOSE, CAPILLARY
GLUCOSE-CAPILLARY: 151 mg/dL — AB (ref 65–99)
GLUCOSE-CAPILLARY: 169 mg/dL — AB (ref 65–99)
GLUCOSE-CAPILLARY: 202 mg/dL — AB (ref 65–99)
GLUCOSE-CAPILLARY: 209 mg/dL — AB (ref 65–99)
Glucose-Capillary: 150 mg/dL — ABNORMAL HIGH (ref 65–99)
Glucose-Capillary: 158 mg/dL — ABNORMAL HIGH (ref 65–99)
Glucose-Capillary: 171 mg/dL — ABNORMAL HIGH (ref 65–99)
Glucose-Capillary: 158 mg/dL — ABNORMAL HIGH (ref 65–99)
Glucose-Capillary: 174 mg/dL — ABNORMAL HIGH (ref 65–99)
Glucose-Capillary: 190 mg/dL — ABNORMAL HIGH (ref 65–99)
Glucose-Capillary: 119 mg/dL — ABNORMAL HIGH (ref 65–99)
Glucose-Capillary: 131 mg/dL — ABNORMAL HIGH (ref 65–99)
Glucose-Capillary: 159 mg/dL — ABNORMAL HIGH (ref 65–99)
Glucose-Capillary: 146 mg/dL — ABNORMAL HIGH (ref 65–99)
Glucose-Capillary: 134 mg/dL — ABNORMAL HIGH (ref 65–99)

## 2015-01-28 LAB — BASIC METABOLIC PANEL
ANION GAP: 12 (ref 5–15)
BUN: 14 mg/dL (ref 6–20)
CHLORIDE: 105 mmol/L (ref 101–111)
CO2: 19 mmol/L — ABNORMAL LOW (ref 22–32)
Calcium: 8.6 mg/dL — ABNORMAL LOW (ref 8.9–10.3)
Creatinine, Ser: 0.69 mg/dL (ref 0.61–1.24)
GFR calc Af Amer: >60 mL/min (ref >60)
GLUCOSE: 150 mg/dL — AB (ref 65–99)
POTASSIUM: 3.8 mmol/L (ref 3.5–5.1)
Sodium: 136 mmol/L (ref 135–145)

## 2015-01-28 LAB — TRIGLYCERIDES: TRIGLYCERIDES: 1263 mg/dL — AB (ref <150)

## 2015-01-28 LAB — CBC
HEMATOCRIT: 48.6 % (ref 40.0–52.0)
HEMOGLOBIN: 16.1 g/dL (ref 13.0–18.0)
MCH: 30.5 pg (ref 26.0–34.0)
MCHC: 33.2 g/dL (ref 32.0–36.0)
MCV: 92 fL (ref 80.0–100.0)
Platelets: 259 10*3/uL (ref 150–440)
RBC: 5.29 MIL/uL (ref 4.40–5.90)
RDW: 13.4 % (ref 11.5–14.5)
WBC: 14.1 10*3/uL — ABNORMAL HIGH (ref 3.8–10.6)

## 2015-01-28 LAB — TROPONIN I
Troponin I: <0.03 ng/mL (ref <0.031)
Troponin I: <0.03 ng/mL (ref <0.031)

## 2015-01-28 LAB — LIPASE, BLOOD: LIPASE: 109 U/L — AB (ref 22–51)

## 2015-01-28 MED ORDER — ASPIRIN 81 MG PO CHEW
81.0000 mg | CHEWABLE_TABLET | Freq: Every day | ORAL | Status: DC
  Administered 2015-01-28 – 2015-01-30 (×3): 81 mg via ORAL
  Filled 2015-01-28 (×4): qty 1

## 2015-01-28 MED ORDER — INSULIN ASPART 100 UNIT/ML ~~LOC~~ SOLN
0.0000 [IU] | Freq: Three times a day (TID) | SUBCUTANEOUS | Status: DC
  Administered 2015-01-28: 3 [IU] via SUBCUTANEOUS
  Administered 2015-01-28 – 2015-01-29 (×4): 4 [IU] via SUBCUTANEOUS
  Filled 2015-01-28: qty 3
  Filled 2015-01-28: qty 1
  Filled 2015-01-28: qty 3
  Filled 2015-01-28 (×2): qty 4

## 2015-01-28 MED ORDER — METOPROLOL TARTRATE 25 MG PO TABS
25.0000 mg | ORAL_TABLET | Freq: Two times a day (BID) | ORAL | Status: DC
  Administered 2015-01-28 – 2015-01-30 (×5): 25 mg via ORAL
  Filled 2015-01-28 (×6): qty 1

## 2015-01-28 MED ORDER — INSULIN ASPART 100 UNIT/ML ~~LOC~~ SOLN
0.0000 [IU] | Freq: Every day | SUBCUTANEOUS | Status: DC
Start: 2015-01-28 — End: 2015-01-29
  Filled 2015-01-28: qty 4

## 2015-01-28 MED ORDER — POTASSIUM CHLORIDE CRYS ER 20 MEQ PO TBCR
20.0000 meq | EXTENDED_RELEASE_TABLET | Freq: Once | ORAL | Status: AC
  Administered 2015-01-28: 20 meq via ORAL
  Filled 2015-01-28: qty 1

## 2015-01-28 MED ORDER — SODIUM CHLORIDE 0.9 % IV SOLN
INTRAVENOUS | Status: DC
  Administered 2015-01-28 – 2015-01-30 (×5): via INTRAVENOUS

## 2015-01-28 MED ORDER — LEVOFLOXACIN IN D5W 500 MG/100ML IV SOLN
500.0000 mg | INTRAVENOUS | Status: DC
  Administered 2015-01-28 – 2015-01-29 (×2): 500 mg via INTRAVENOUS
  Filled 2015-01-28 (×3): qty 100

## 2015-01-28 MED ORDER — INSULIN DETEMIR 100 UNIT/ML ~~LOC~~ SOLN
30.0000 [IU] | Freq: Two times a day (BID) | SUBCUTANEOUS | Status: DC
  Administered 2015-01-28 – 2015-01-29 (×3): 30 [IU] via SUBCUTANEOUS
  Filled 2015-01-28 (×5): qty 0.3

## 2015-01-28 NOTE — Progress Notes (Signed)
Pt transferred to room 147 per dr orders. Pt transported by wheelchair, accompanied by rn. Pt tolerated well with no complications. Care transferred to [place], [place]

## 2015-01-28 NOTE — Clinical Social Work Note (Signed)
Consult was received for CSW due to patient needing medication assistance. This consult is appropriate for RN CM and they will see patient for this. Please reconsult if CSW is needed. York Spaniel MSW,LCSW [phone]

## 2015-01-28 NOTE — Progress Notes (Signed)
Pt stated he experienced chest pain during night and this morning. When initial assessment/pain assessment was completed at beginning of shift, pt never mentioned chest pain. Dr. Winona Legato entered new orders including chest xray. Pt was transported to xray at 0920 by orderly and rn. Pt tolerated well with no complications. Pt now sleeping comfortably in bed. Vss. Will continue to monitor.

## 2015-01-28 NOTE — Consult Note (Cosign Needed)
Pt very groggy from needed pain medicine.  Lipase down to 109, triglyceride to 1263, was 5000.  Not on Zofran, getting phenergan for nausea. Chest exam shows slight exp wheeze, bowel sounds present, epigastric area still very tender. BP 105/60, P 92, R 36 with 92% sat rate.  Suggest endocrinology consult for hypertriglyceride treatment during and after hospitalization.

## 2015-01-28 NOTE — Progress Notes (Signed)
Pt awoke from sleep complaining of abdominal pain. Rated pain 9/10. Administered prn dose of pain med (See Mar).

## 2015-01-28 NOTE — Progress Notes (Addendum)
Martin [facility]   PATIENT NAME: Martin Riddle    MR#:  [id number]  DATE OF BIRTH:  01/07/1971  SUBJECTIVE:  CHIEF COMPLAINT:   Chief Complaint  Patient presents with  . Abdominal Pain    awoke him this am, history of pancreatitis   the patient is 44 year old male with past medical history of diabetes, insulin-dependent, who presents to the [facility] with complaints of abdominal pain and was noted to have elevated lipase level, as well as hypertriglyceridemia more than 5000, he was initiated on insulin IV drip and his triglyceride level dropped down to about 1000. His lipase level also has improved. Patient still complains of your of abdominal pain and which is not significantly improved, yet. Nausea and vomiting as well. Hypoxia. Today in the morning and IV fluids were decreased. Complains of chest pains, left-sided, radiating to left shoulder, started last night  Review of Systems  Constitutional: Negative for fever, chills and weight loss.  HENT: Negative for congestion.   Eyes: Negative for blurred vision and double vision.  Respiratory: Negative for cough, sputum production, shortness of breath and wheezing.   Cardiovascular: Negative for chest pain, palpitations, orthopnea, leg swelling and PND.  Gastrointestinal: Positive for nausea, vomiting and abdominal pain. Negative for diarrhea, constipation and blood in stool.  Genitourinary: Negative for dysuria, urgency, frequency and hematuria.  Musculoskeletal: Negative for falls.  Neurological: Negative for dizziness, tremors, focal weakness and headaches.  Endo/Heme/Allergies: Does not bruise/bleed easily.  Psychiatric/Behavioral: Negative for depression. The patient does not have insomnia.     VITAL SIGNS: Blood pressure 115/69, pulse 95, temperature 98.7 F (37.1 C), temperature source Oral, resp. rate 34, height 6\' 4"  (1.93 m), weight 120.203 kg (265 lb), SpO2 92 %.  PHYSICAL EXAMINATION:    GENERAL:  44 y.o.-year-old patient lying in the bed in moderate distress due to abdominal pains, holding his upper abdomen with his arms.  EYES: Pupils equal, round, reactive to light and accommodation. No scleral icterus. Extraocular muscles intact.  HEENT: Head atraumatic, normocephalic. Oropharynx and nasopharynx clear.  NECK:  Supple, no jugular venous distention. No thyroid enlargement, no tenderness.  LUNGS: Normal breath sounds bilaterally, no wheezing, rales,rhonchi or crepitation. No use of accessory muscles of respiration.  CARDIOVASCULAR: S1, S2 normal. No murmurs, rubs, or gallops.  ABDOMEN: Soft, diffusely tender with some voluntary guarding mostly in the upper abdomen, nondistended. Bowel sounds present. No organomegaly or mass.  EXTREMITIES: No pedal edema, cyanosis, or clubbing.  NEUROLOGIC: Cranial nerves II through XII are intact. Muscle strength 5/5 in all extremities. Sensation intact. Gait not checked.  PSYCHIATRIC: The patient is alert and oriented x 3.  SKIN: No obvious rash, lesion, or ulcer.   ORDERS/RESULTS REVIEWED:   CBC  Recent Labs Lab 01/27/15 0837 01/28/15 0513  WBC 13.5* 14.1*  HGB 16.4 16.1  HCT 46.9 48.6  PLT 244 259  MCV 92.0 92.0  MCH 32.2 30.5  MCHC 35.0 33.2  RDW 12.8 13.4  LYMPHSABS 1.4  --   MONOABS 0.8  --   EOSABS 0.0  --   BASOSABS 0.1  --    ------------------------------------------------------------------------------------------------------------------  Chemistries   Recent Labs Lab 01/27/15 0837 01/27/15 1513 01/28/15 0513  NA 131* SEE COMMENTS 136  K 4.7 5.0 3.8  CL 96* 98* 105  CO2 21* 18* 19*  GLUCOSE 286* 193* 150*  BUN 22* 17 14  CREATININE 0.96 0.84 0.69  CALCIUM 9.4 8.9 8.6*  MG  --  2.2  --  AST SEE COMMENTS  --   --   ALT 37  --   --   ALKPHOS 54  --   --   BILITOT 0.9  --   --     ------------------------------------------------------------------------------------------------------------------ estimated creatinine clearance is 165.3 mL/min (by C-G formula based on Cr of 0.69). ------------------------------------------------------------------------------------------------------------------  Recent Labs  01/27/15 1513  TSH 0.512    Cardiac Enzymes  Recent Labs Lab 01/27/15 0837  TROPONINI <0.03   ------------------------------------------------------------------------------------------------------------------ Invalid input(s): POCBNP ---------------------------------------------------------------------------------------------------------------  RADIOLOGY: No results found.  EKG:  Orders placed or performed during the [facility] encounter of 01/27/15  . EKG 12-Lead  . EKG 12-Lead    ASSESSMENT AND PLAN:  Principal Problem:   Acute pancreatitis Active Problems:   Hyponatremia   Hyperglycemia   Leukocytosis 1. Acute pancreatitis due to hypertriglyceridemia. Continue insulin, keep patient nothing by mouth, continue IV fluids, pain medications 2. Hypertriglyceridemia. Continue American fatty acids. Change insulin IV drip to  Levemir, check a triglyceride level tomorrow morning 3. Diabetes mellitus type 2 with mild DKA, poorly controlled with hemoglobin A1c 11.4, patient was on insulin IV drip due to mild DKA and hypertriglyceridemia now. We are going to change to Levemir,  dose should be advanced as needed 4. Leukocytosis, unclear etiology, possibly stress and pancreatic inflammation related, initiate levofloxacin, follow white blood cell counts tomorrow morning 5. Hypoxia. Get the chest x-ray, his IV fluid rate have been decreased 6. Chest pain, unclear etiology, rule out cardiac. Get EKG, cardiac enzymes 3, initiate patient on metoprolol as well as aspirin, continue heparin subcutaneously  Management plans discussed with the patient, family and they  are in agreement.   DRUG ALLERGIES:  Allergies  Allergen Reactions  . Morphine And Related Other (See Comments)    headache    CODE STATUS:     Code Status Orders        Start     Ordered   01/27/15 2008  Full code   Continuous     01/27/15 2007      TOTAL TIME TAKING CARE OF THIS PATIENT: 40 minutes.    Katharina Caper M.D on 01/28/2015 at 9:35 AM  Between 7am to 6pm - Pager - [phone]  After 6pm go to [email or web address] - password [email or web address] [place] [facility]  [facility]  Office  [phone]  CC: Primary care physician; Dortha Kern, PA

## 2015-01-29 LAB — TROPONIN I
TROPONIN I: 0.05 ng/mL — AB (ref <0.031)
Troponin I: <0.03 ng/mL (ref <0.031)

## 2015-01-29 LAB — TRIGLYCERIDES: TRIGLYCERIDES: 851 mg/dL — AB (ref <150)

## 2015-01-29 LAB — GLUCOSE, CAPILLARY
GLUCOSE-CAPILLARY: 155 mg/dL — AB (ref 65–99)
GLUCOSE-CAPILLARY: 163 mg/dL — AB (ref 65–99)
GLUCOSE-CAPILLARY: 196 mg/dL — AB (ref 65–99)
Glucose-Capillary: 196 mg/dL — ABNORMAL HIGH (ref 65–99)

## 2015-01-29 LAB — LIPASE, BLOOD: LIPASE: 65 U/L — AB (ref 22–51)

## 2015-01-29 MED ORDER — HYDROMORPHONE HCL 1 MG/ML IJ SOLN
1.0000 mg | Freq: Three times a day (TID) | INTRAMUSCULAR | Status: DC | PRN
  Administered 2015-01-29 – 2015-01-30 (×3): 1 mg via INTRAVENOUS
  Filled 2015-01-29 (×3): qty 1

## 2015-01-29 MED ORDER — OXYCODONE HCL 5 MG PO TABS
30.0000 mg | ORAL_TABLET | Freq: Four times a day (QID) | ORAL | Status: DC | PRN
  Administered 2015-01-29: 30 mg via ORAL
  Filled 2015-01-29: qty 6

## 2015-01-29 MED ORDER — FAMOTIDINE 20 MG PO TABS
20.0000 mg | ORAL_TABLET | Freq: Two times a day (BID) | ORAL | Status: DC
  Administered 2015-01-29 – 2015-01-30 (×2): 20 mg via ORAL
  Filled 2015-01-29 (×2): qty 1

## 2015-01-29 MED ORDER — INSULIN DETEMIR 100 UNIT/ML ~~LOC~~ SOLN
30.0000 [IU] | Freq: Every day | SUBCUTANEOUS | Status: DC
  Filled 2015-01-29: qty 0.3

## 2015-01-29 MED ORDER — GLIPIZIDE 5 MG PO TABS
5.0000 mg | ORAL_TABLET | Freq: Two times a day (BID) | ORAL | Status: DC
  Administered 2015-01-29 – 2015-01-30 (×2): 5 mg via ORAL
  Filled 2015-01-29 (×2): qty 1

## 2015-01-29 MED ORDER — ALUM & MAG HYDROXIDE-SIMETH 200-200-20 MG/5ML PO SUSP
30.0000 mL | Freq: Four times a day (QID) | ORAL | Status: DC | PRN
  Administered 2015-01-29 – 2015-01-30 (×2): 30 mL via ORAL
  Filled 2015-01-29 (×2): qty 30

## 2015-01-29 NOTE — Progress Notes (Signed)
Inpatient Diabetes Program Recommendations  AACE/ADA: New Consensus Statement on Inpatient Glycemic Control (2013)  Target Ranges:  Prepandial:   less than 140 mg/dL      Peak postprandial:   less than 180 mg/dL (1-2 hours)      Critically ill patients:  140 - 180 mg/dL   Review of Glycemic Control:  Results for ABUNDIO, TEUSCHER (MRN [id number]) as of 01/29/2015 11:31  Ref. Range 01/28/2015 12:03 01/28/2015 15:10 01/28/2015 17:51 01/28/2015 20:44 01/29/2015 11:07  Glucose-Capillary Latest Ref Range: 65-99 mg/dL 829 (H) 562 (H) 130 (H) 134 (H) 163 (H)    Diabetes history: Type 2 diabetes--Acute pancreatitis Outpatient Diabetes medications: Invokamet [phone] bid, Levemir 28 units bid, Glipizide 10 mg daily Current orders for Inpatient glycemic control:  Novolog resistant tid with meals and HS, Levemir 30 units bid  Spoke briefly to patient.  He was sleepy.  He states that he does have insurance but did not have a current prescription.  He states that he see's Dr. Tedd Sias but was unable to afford the high co-pay to see "specialist" and therefore did not get a refill.  States he has Rx. Coverage but was out of insulin for approximately a week prior to admit.    Thanks, Beryl Meager, RN, BC-ADM Inpatient Diabetes Coordinator Pager [phone] (8a-5p)

## 2015-01-29 NOTE — Consult Note (Signed)
Pt feeling better, breathing better, using incentive spirometry.  Chest clear anterior fields.  May need medication for triglycerides on discharge.  No specific GI intervention needed and I will sign off.

## 2015-01-29 NOTE — Care Management Note (Addendum)
Case Management Note  Patient Details  Name: Martin [facility] MRN: [id number] Date of Birth: 12-13-1970  Subjective/Objective:                   [facility] consult received for Rx assistance. Patient has health insurance coverage with high deductible on insulin per patient. He is from home with his wife. Patient could not stay awake while I was talking to him; I had to keep waking him up to do RNCM assessment. He states "he is weak". He has been getting up per unit clerk without assistance because he is setting bed alarm off. He seems to smell of alcohol; I have notified charge nurse.  He states his PCP is in [place] (Dr. Channing Mutters he is a neurosurgeon?[phone]. He uses [facility] [phone] for Rx. I have requested copy of Rx filled at their agency. I will know who prescribes insulin when I receive fax.  Action/Plan: RNCM will continue to follow.   Expected Discharge Date:                  Expected Discharge Plan:     In-House Referral:     Discharge planning Services  CM Consult  Post Acute Care Choice:    Choice offered to:  Patient  DME Arranged:    DME Agency:     HH Arranged:    HH Agency:     Status of Service:  In process, will continue to follow  Medicare Important Message Given:    Date Medicare IM Given:    Medicare IM give by:    Date Additional Medicare IM Given:    Additional Medicare Important Message give by:     If discussed at Long Length of Stay Meetings, dates discussed:    Additional Comments: Received fax from [facility] with many Rx antianxiety Rx, Morphine sulphate, lyrica, oxycodone, oxycontin, and Miralax- all were by Dr. Loraine Leriche Roy/Betty Gaynell Face. There was an order for One Touch,  and Lisinopril, gemfibrozil , and glipizide, from Dr. Carlena Sax. Copy placed on patient's chart.  [facility]: Glipizide  tab* . . . . . . . . . . . . . . . . . 60 day ($4). . . . . 180 day ($10). Notified by Dr. Sherryll Burger that patient needs PCP. Since patient goes to [facility] with Dr. Tedd Sias for  diabetes. [facility] cannot see him- He is listed with a Dr. Carron Brazen in [place] which has closed his practice. His PCP will need to be changed by patient- he has been made aware of this need.  Collie Siad, RN 01/29/2015, 10:30 AM

## 2015-01-29 NOTE — Progress Notes (Signed)
[facility] [place] [facility]   PATIENT NAME: Martin [facility]    MR#:  [id number]  DATE OF BIRTH:  09-26-1970  SUBJECTIVE:  CHIEF COMPLAINT:   Chief Complaint  Patient presents with  . Abdominal Pain    awoke him this am, history of pancreatitis   the patient is 44 year old male with past medical history of diabetes, insulin-dependent, who presents to the hospital with complaints of abdominal pain and was noted to have elevated lipase level, as well as hypertriglyceridemia more than 5000, he was initiated on insulin IV drip and his triglyceride level dropped down to about 1000. His lipase level continues to improve.  He complains of pain 7 out of 10  Review of Systems  Constitutional: Negative for fever, chills and weight loss.  HENT: Negative for congestion.   Eyes: Negative for blurred vision and double vision.  Respiratory: Negative for cough, sputum production, shortness of breath and wheezing.   Cardiovascular: Negative for chest pain, palpitations, orthopnea, leg swelling and PND.  Gastrointestinal: Positive for nausea, vomiting and abdominal pain. Negative for diarrhea, constipation and blood in stool.  Genitourinary: Negative for dysuria, urgency, frequency and hematuria.  Musculoskeletal: Negative for falls.  Neurological: Negative for dizziness, tremors, focal weakness and headaches.  Endo/Heme/Allergies: Does not bruise/bleed easily.  Psychiatric/Behavioral: Negative for depression. The patient does not have insomnia.     VITAL SIGNS: Blood pressure 168/76, pulse 93, temperature 98.1 F (36.7 C), temperature source Oral, resp. rate 18, height  (1.93 m), weight 120.203 kg (265 lb), SpO2 95 %.  PHYSICAL EXAMINATION:   GENERAL:  44 y.o.-year-old patient lying in the bed in moderate distress due to abdominal pains, holding his upper abdomen with his arms.  EYES: Pupils equal, round, reactive to light and accommodation. No scleral icterus.  Extraocular muscles intact.  HEENT: Head atraumatic, normocephalic. Oropharynx and nasopharynx clear.  NECK:  Supple, no jugular venous distention. No thyroid enlargement, no tenderness.  LUNGS: Normal breath sounds bilaterally, no wheezing, rales,rhonchi or crepitation. No use of accessory muscles of respiration.  CARDIOVASCULAR: S1, S2 normal. No murmurs, rubs, or gallops.  ABDOMEN: Soft, diffusely tender with some voluntary guarding mostly in the upper abdomen, nondistended. Bowel sounds present. No organomegaly or mass.  EXTREMITIES: No pedal edema, cyanosis, or clubbing.  NEUROLOGIC: Cranial nerves II through XII are intact. Muscle strength 5/5 in all extremities. Sensation intact. Gait not checked.  PSYCHIATRIC: The patient is alert and oriented x 3.  SKIN: No obvious rash, lesion, or ulcer.   CBC  Recent Labs Lab 01/27/15 0837 01/28/15 0513  WBC 13.5* 14.1*  HGB 16.4 16.1  HCT 46.9 48.6  PLT 244 259  MCV 92.0 92.0  MCH 32.2 30.5  MCHC 35.0 33.2  RDW 12.8 13.4  LYMPHSABS 1.4  --   MONOABS 0.8  --   EOSABS 0.0  --   BASOSABS 0.1  --    ------------------------------------------------------------------------------------------------------------------  Chemistries   Recent Labs Lab 01/27/15 0837 01/27/15 1513 01/28/15 0513  NA 131* SEE COMMENTS 136  K 4.7 5.0 3.8  CL 96* 98* 105  CO2 21* 18* 19*  GLUCOSE 286* 193* 150*  BUN 22* 17 14  CREATININE 0.96 0.84 0.69  CALCIUM 9.4 8.9 8.6*  MG  --  2.2  --   AST SEE COMMENTS  --   --   ALT 37  --   --   ALKPHOS 54  --   --   BILITOT 0.9  --   --    ------------------------------------------------------------------------------------------------------------------  estimated creatinine clearance is 165.3 mL/min (by C-G formula based on Cr of  0.69). ---------------------------------------------------------------------------------------------------------------------------------------------------------------------------------------------------------------------------------  RADIOLOGY: Dg Chest 2 View  01/28/2015   CLINICAL DATA:  Patient with hypoxia.  EXAM: CHEST  2 VIEW  COMPARISON:  Chest radiograph 08/29/2014  FINDINGS: Stable cardiac and mediastinal contours. No consolidative pulmonary opacities. No pleural effusion or pneumothorax. Mid thoracic spine degenerative changes.  IMPRESSION: No acute cardiopulmonary process.   Electronically Signed   By: Annia Belt M.D.   On: 01/28/2015 11:10    ASSESSMENT AND PLAN:  Principal Problem:   Acute pancreatitis Active Problems:   Hyponatremia   Hyperglycemia   Leukocytosis 1. Acute pancreatitis due to hypertriglyceridemia: stop insulin, keep patient nothing by mouth, continue IV fluids, taper pain medications  2. Hypertriglyceridemia. Continue American fatty acids. Change insulin IV drip to  Levemir, triglyceride level 851   3. Diabetes mellitus type 2 with mild DKA, poorly controlled with hemoglobin A1c 11.4, .glipizide  4. Leukocytosis, unclear etiology, possibly stress and pancreatic inflammation related, stop antibiotics   5. Hypoxia: Likely due to not able to take deep breath from pain, we will stop antibiotics.  Chest x-ray looks normal  6. Chest pain: Negative serial troponins, this is unlikely cardiac, likely from stress/ Musculoskeletal  Management plans discussed with the patient and he is in agreement.   DRUG ALLERGIES:  Allergies  Allergen Reactions  . Morphine And Related Other (See Comments)    headache    CODE STATUS: Full code  TOTAL TIME TAKING CARE OF THIS PATIENT: 30 minutes.   Possible discharge tomorrow if doing better.  I have informed him that I will not be giving him any narcotics on discharge. He is already following with pain management doctor  in [place], [place].   [facility], Jenniferann Stuckert M.D on 01/29/2015 at 11:52 AM  Between 7am to 6pm - Pager - [phone]  After 6pm go to [email or web address] - password [email or web address] [facility]  [place] [facility]  Office  [phone]  CC: Primary care physician; Dortha Kern, PA

## 2015-01-29 NOTE — Progress Notes (Signed)
Initial Nutrition Assessment  INTERVENTION:   Education:  RD provided "Pancreatitis Nutrition Therapy" handout from the Academy of Nutrition and Dietetics. Discussed nutrition therapy to reduce the irritation of the pancreas to promote digestion and absorption of nutrients. Provided list of recommended low fat foods as well as a recommended sample day. Questions answered and lots of examples provided. Teach back method used. Expect good compliance.  NUTRITION DIAGNOSIS:   Food and nutrition related knowledge deficit related to acute illness as evidenced by per patient/family report.  GOAL:   Patient will meet greater than or equal to 90% of their needs  MONITOR:    (Energy Intake, Glucose Profile, Lipid Profile, Gastrointestinal Profile)  REASON FOR ASSESSMENT:   Diagnosis    ASSESSMENT:   Pt admitted with acute pancreatitis.  Past Medical History  Diagnosis Date  . Diabetes mellitus without complication   . Hypertension   . Hypercholesteremia   . Pancreatitis     Diet Order:  Diet clear liquid Room service appropriate?: Yes; Fluid consistency:: Thin   Current Nutrition: Pt tolerating CL on visit today.  Food/Nutrition-Related History: Per pt and wife pt eats primarily meat and carbohydrates at meal times, little fruits and vegetables.    Medications: glucotrol, ativan, levemir, NS at 189mL/hr  Electrolyte/Renal Profile and Glucose Profile:   Recent Labs Lab 01/27/15 0837 01/27/15 1513 01/28/15 0513  NA 131* SEE COMMENTS 136  K 4.7 5.0 3.8  CL 96* 98* 105  CO2 21* 18* 19*  BUN 22* 17 14  CREATININE 0.96 0.84 0.69  CALCIUM 9.4 8.9 8.6*  MG  --  2.2  --   GLUCOSE 286* 193* 150*   Protein Profile:  Recent Labs Lab 01/27/15 0837  ALBUMIN 4.3    Gastrointestinal Profile: Last BM: 01/27/2015  Height:   Ht Readings from Last 1 Encounters:  01/27/15  (1.93 m)    Weight:   Wt Readings from Last 1 Encounters:  01/27/15 265 lb (120.203 kg)     BMI:  Body mass index is 32.27 kg/(m^2).   EDUCATION NEEDS:   Education needs addressed   LOW Care Level  Leda Quail, [place], LDN Pager [phone]

## 2015-01-29 NOTE — Care Management Important Message (Signed)
Important Message  Patient Details  Name: Martin [facility] MRN: [id number] Date of Birth: 1971/04/03   Medicare Important Message Given:  Yes-second notification given    Olegario Messier A Allmond 01/29/2015, 11:03 AM

## 2015-01-30 LAB — GLUCOSE, CAPILLARY: Glucose-Capillary: 176 mg/dL — ABNORMAL HIGH (ref 65–99)

## 2015-01-30 MED ORDER — LEVEMIR 100 UNIT/ML ~~LOC~~ SOLN
SUBCUTANEOUS | Status: DC
Start: 2015-01-30 — End: 2017-01-15

## 2015-01-30 MED ORDER — OMEGA-3-ACID ETHYL ESTERS 1 G PO CAPS: 1.0000 g | ORAL_CAPSULE | Freq: Two times a day (BID) | ORAL | Status: DC

## 2015-01-30 MED ORDER — ONDANSETRON 4 MG PO TBDP: 4.0000 mg | ORAL_TABLET | Freq: Three times a day (TID) | ORAL | Status: DC | PRN

## 2015-01-30 NOTE — Discharge Instructions (Signed)

## 2015-01-30 NOTE — Progress Notes (Signed)
Inpatient Diabetes Program Recommendations  AACE/ADA: New Consensus Statement on Inpatient Glycemic Control (2013)  Target Ranges:  Prepandial:   less than 140 mg/dL      Peak postprandial:   less than 180 mg/dL (1-2 hours)      Critically ill patients:  140 - 180 mg/dL   Reason for Visit: review of home meds, importance of blood sugar testing  Met with patient and his wife to review the need to take medications as ordered and to be proactive in obtaining prescriptions when he sees his doctors so he doesn't run out of medications in the future.  He currently does not have an order for insulin testing strips so RN Azucena Kuba has requested a prescription from MD.  Patient reveals he does not have a primary care MD because his insurance will not pay to see a PA- I have requested they call Denis Crissmon's office and see if they can continue at the practice but see an MD.  Patient reveals he's been having panic attacks- asked if he has checked sugar when this happens and he has not- recommended he check sugar when he feels this way, check blood sugars daily and bring his meter and written blood sugars to all MD appointments.  Reviewed meal planning and the need to manage carbohydrates.  Eliminate sweetened drinks from his diet. Follow up with Dr. Gabriel Carina as requested.  Gentry Fitz, RN, BA, MHA, CDE Diabetes Coordinator Inpatient Diabetes Program  [phone] (Team Pager) [phone] ([place]) 01/30/2015 10:42 AM

## 2015-02-01 NOTE — Discharge Summary (Signed)
[facility] [place] [facility]   PATIENT NAME: Martin Riddle    MR#:  [id number]  DATE OF BIRTH:  17-Mar-1971  DATE OF ADMISSION:  01/27/2015 ADMITTING PHYSICIAN: Katharina Caper, MD  DATE OF DISCHARGE: 01/30/2015 10:44 AM  PRIMARY CARE PHYSICIAN: Dortha Kern, PA    ADMISSION DIAGNOSIS:  Acute pancreatitis, unspecified pancreatitis type [K85.9]  DISCHARGE DIAGNOSIS:  Principal Problem:   Acute pancreatitis Active Problems:   Hyponatremia   Hyperglycemia   Leukocytosis  SECONDARY DIAGNOSIS:   Past Medical History  Diagnosis Date  . Diabetes mellitus without complication   . Hypertension   . Hypercholesteremia   . Pancreatitis    HOSPITAL COURSE:  44 year old Caucasian male with past medical history significant pancreatitis in October 2015, history of diabetes, on insulin therapy, hypertension, hyperlipidemia presented to the hospital with complaints of severe abdominal pain, and was diagnosed with acute pancreatitis.he was found to have a triglyceride level of over 5000. Please see Dr. Arlys John dictated history and physical for further details.he was started on insulin drip, which helped him tremendously and his pancreatic enzyme levels started coming down and so was his triglyceride.  GI consultation was obtained with Dr. Mechele Collin, who agreed with above management was slowly improving was feeling much better by 23rd of August and was discharged home in stable condition. DISCHARGE CONDITIONS:  Stable CONSULTS OBTAINED:  Treatment Team:  Scot Jun, MD  DRUG ALLERGIES:   Allergies  Allergen Reactions  . Morphine And Related Other (See Comments)    headache   DISCHARGE MEDICATIONS:   Discharge Medication List as of 01/30/2015  9:15 AM    START taking these medications   Details  omega-3 acid ethyl esters (LOVAZA) 1 G capsule Take 1 capsule (1 g total) by mouth 2 (two) times daily., Starting 01/30/2015, Until Discontinued, Normal     ondansetron (ZOFRAN ODT) 4 MG disintegrating tablet Take 1 tablet (4 mg total) by mouth every 8 (eight) hours as needed for nausea or vomiting., Starting 01/30/2015, Until Discontinued, Normal      CONTINUE these medications which have CHANGED   Details  LEVEMIR 100 UNIT/ML injection INJECT 28 UNITS SQ BID, Normal      CONTINUE these medications which have NOT CHANGED   Details  acetaminophen (TYLENOL) 325 MG tablet Take 325 mg by mouth every 6 (six) hours as needed for mild pain or moderate pain. , Until Discontinued, Historical Med    glipiZIDE (GLUCOTROL XL) 10 MG 24 hr tablet TK 1 T PO QD, Historical Med    INVOKAMET [phone] MG TABS TK 1 T PO BID, Historical Med    lisinopril (PRINIVIL,ZESTRIL) 40 MG tablet TK 1 T PO QD, Historical Med    LORazepam (ATIVAN) 2 MG tablet Take 2 mg by mouth at bedtime., Starting 01/05/2015, Until Discontinued, Historical Med    LYRICA 50 MG capsule TK 1 C PO TID, Historical Med    oxycodone (ROXICODONE) 30 MG immediate release tablet TK 1 T PO Q 3 H, Historical Med    OXYCONTIN 40 MG T12A 12 hr tablet TK 1 T PO Q 6  H, Historical Med    polyethylene glycol powder (GLYCOLAX/MIRALAX) powder MIX 17 GRAMS WITH 8 OUNCES OF FLUID AND DRINK ONCE DAILY, Historical Med    promethazine (PHENERGAN) 25 MG tablet TK 1 T PO Q 6 H PRF NAUSEA, Historical Med       DISCHARGE INSTRUCTIONS:   DIET:  Low fat, Low cholesterol diet  DISCHARGE CONDITION:  Good  ACTIVITY:  Activity as tolerated  OXYGEN:  Home Oxygen: No.   Oxygen Delivery: room air  DISCHARGE LOCATION:  home   If you experience worsening of your admission symptoms, develop shortness of breath, life threatening emergency, suicidal or homicidal thoughts you must seek medical attention immediately by calling 911 or calling your MD immediately  if symptoms less severe.  You Must read complete instructions/literature along with all the possible adverse reactions/side effects for all the  Medicines you take and that have been prescribed to you. Take any new Medicines after you have completely understood and accpet all the possible adverse reactions/side effects.   Please note  You were cared for by a hospitalist during your hospital stay. If you have any questions about your discharge medications or the care you received while you were in the hospital after you are discharged, you can call the unit and asked to speak with the hospitalist on call if the hospitalist that took care of you is not available. Once you are discharged, your primary care physician will handle any further medical issues. Please note that NO REFILLS for any discharge medications will be authorized once you are discharged, as it is imperative that you return to your primary care physician (or establish a relationship with a primary care physician if you do not have one) for your aftercare needs so that they can reassess your need for medications and monitor your lab values.    On the day of Discharge:   VITAL SIGNS:  Blood pressure 145/77, pulse 95, temperature 97.7 F (36.5 C), temperature source Oral, resp. rate 18, height 6\' 4"  (1.93 m), weight 120.203 kg (265 lb), SpO2 95 %.  I/O:  No intake or output data in the 24 hours ending 02/01/15 1539  PHYSICAL EXAMINATION:  GENERAL:  44 y.o.-year-old patient lying in the bed with no acute distress.  EYES: Pupils equal, round, reactive to light and accommodation. No scleral icterus. Extraocular muscles intact.  HEENT: Head atraumatic, normocephalic. Oropharynx and nasopharynx clear.  NECK:  Supple, no jugular venous distention. No thyroid enlargement, no tenderness.  LUNGS: Normal breath sounds bilaterally, no wheezing, rales,rhonchi or crepitation. No use of accessory muscles of respiration.  CARDIOVASCULAR: S1, S2 normal. No murmurs, rubs, or gallops.  ABDOMEN: Soft, non-tender, non-distended. Bowel sounds present. No organomegaly or mass.  EXTREMITIES: No  pedal edema, cyanosis, or clubbing.  NEUROLOGIC: Cranial nerves II through XII are intact. Muscle strength 5/5 in all extremities. Sensation intact. Gait not checked.  PSYCHIATRIC: The patient is alert and oriented x 3.  SKIN: No obvious rash, lesion, or ulcer.   DATA REVIEW:   CBC  Recent Labs Lab 01/28/15 0513  WBC 14.1*  HGB 16.1  HCT 48.6  PLT 259    Chemistries   Recent Labs Lab 01/27/15 0837 01/27/15 1513 01/28/15 0513  NA 131* SEE COMMENTS 136  K 4.7 5.0 3.8  CL 96* 98* 105  CO2 21* 18* 19*  GLUCOSE 286* 193* 150*  BUN 22* 17 14  CREATININE 0.96 0.84 0.69  CALCIUM 9.4 8.9 8.6*  MG  --  2.2  --   AST SEE COMMENTS  --   --   ALT 37  --   --   ALKPHOS 54  --   --   BILITOT 0.9  --   --     Cardiac Enzymes  Recent Labs Lab 01/29/15 0914  TROPONINI <0.03    Microbiology Results  Results for orders placed or  performed during the hospital encounter of 01/27/15  MRSA PCR Screening     Status: None   Collection Time: 01/27/15  4:20 PM  Result Value Ref Range Status   MRSA by PCR NEGATIVE NEGATIVE Final    Comment:        The GeneXpert MRSA Assay (FDA approved for NASAL specimens only), is one component of a comprehensive MRSA colonization surveillance program. It is not intended to diagnose MRSA infection nor to guide or monitor treatment for MRSA infections.     RADIOLOGY:  No results found.   Management plans discussed with the patient, family and they are in agreement.  CODE STATUS: full code  TOTAL TIME TAKING CARE OF THIS PATIENT: 55 minutes.    [facility], Sandrika Schwinn M.D on 02/01/2015 at 3:39 PM  Between 7am to 6pm - Pager - [phone]  After 6pm go to [email or web address] - password [email or web address] [facility]  [facility]  Office  [phone]  CC: Primary care physician; Dortha Kern, PA Scot Jun, MD

## 2015-02-05 ENCOUNTER — Inpatient Hospital Stay: Payer: Self-pay | Admitting: Family Medicine

## 2015-12-09 ENCOUNTER — Encounter: Payer: Self-pay | Admitting: Emergency Medicine

## 2015-12-09 ENCOUNTER — Inpatient Hospital Stay
Admission: EM | Admit: 2015-12-09 | Discharge: 2015-12-11 | DRG: 638 | Disposition: A | Discharge status: Home / Self Care | Payer: Medicare Other | Attending: Internal Medicine | Admitting: Internal Medicine

## 2015-12-09 ENCOUNTER — Inpatient Hospital Stay: Payer: Medicare Other

## 2015-12-09 DIAGNOSIS — E1100 Type 2 diabetes mellitus with hyperosmolarity without nonketotic hyperglycemic-hyperosmolar coma (NKHHC): Secondary | ICD-10-CM | POA: Diagnosis not present

## 2015-12-09 DIAGNOSIS — K5900 Constipation, unspecified: Secondary | ICD-10-CM | POA: Diagnosis present

## 2015-12-09 DIAGNOSIS — E785 Hyperlipidemia, unspecified: Secondary | ICD-10-CM | POA: Diagnosis present

## 2015-12-09 DIAGNOSIS — Z91120 Patient's intentional underdosing of medication regimen due to financial hardship: Secondary | ICD-10-CM

## 2015-12-09 DIAGNOSIS — R109 Unspecified abdominal pain: Secondary | ICD-10-CM | POA: Diagnosis not present

## 2015-12-09 DIAGNOSIS — R1032 Left lower quadrant pain: Secondary | ICD-10-CM | POA: Diagnosis present

## 2015-12-09 DIAGNOSIS — R739 Hyperglycemia, unspecified: Secondary | ICD-10-CM

## 2015-12-09 DIAGNOSIS — Z8249 Family history of ischemic heart disease and other diseases of the circulatory system: Secondary | ICD-10-CM

## 2015-12-09 DIAGNOSIS — E871 Hypo-osmolality and hyponatremia: Secondary | ICD-10-CM | POA: Diagnosis present

## 2015-12-09 DIAGNOSIS — E11621 Type 2 diabetes mellitus with foot ulcer: Secondary | ICD-10-CM | POA: Diagnosis present

## 2015-12-09 DIAGNOSIS — E1101 Type 2 diabetes mellitus with hyperosmolarity with coma: Secondary | ICD-10-CM

## 2015-12-09 DIAGNOSIS — E669 Obesity, unspecified: Secondary | ICD-10-CM | POA: Diagnosis present

## 2015-12-09 DIAGNOSIS — Z9119 Patient's noncompliance with other medical treatment and regimen: Secondary | ICD-10-CM | POA: Diagnosis not present

## 2015-12-09 DIAGNOSIS — T383X6A Underdosing of insulin and oral hypoglycemic [antidiabetic] drugs, initial encounter: Secondary | ICD-10-CM | POA: Diagnosis present

## 2015-12-09 DIAGNOSIS — L97519 Non-pressure chronic ulcer of other part of right foot with unspecified severity: Secondary | ICD-10-CM | POA: Diagnosis present

## 2015-12-09 DIAGNOSIS — R1084 Generalized abdominal pain: Secondary | ICD-10-CM

## 2015-12-09 DIAGNOSIS — R112 Nausea with vomiting, unspecified: Secondary | ICD-10-CM

## 2015-12-09 DIAGNOSIS — F17290 Nicotine dependence, other tobacco product, uncomplicated: Secondary | ICD-10-CM | POA: Diagnosis present

## 2015-12-09 DIAGNOSIS — Z6830 Body mass index (BMI) 30.0-30.9, adult: Secondary | ICD-10-CM

## 2015-12-09 DIAGNOSIS — K869 Disease of pancreas, unspecified: Secondary | ICD-10-CM | POA: Diagnosis present

## 2015-12-09 DIAGNOSIS — N179 Acute kidney failure, unspecified: Secondary | ICD-10-CM | POA: Diagnosis present

## 2015-12-09 DIAGNOSIS — D739 Disease of spleen, unspecified: Secondary | ICD-10-CM

## 2015-12-09 DIAGNOSIS — E860 Dehydration: Secondary | ICD-10-CM | POA: Diagnosis present

## 2015-12-09 DIAGNOSIS — Z833 Family history of diabetes mellitus: Secondary | ICD-10-CM | POA: Diagnosis not present

## 2015-12-09 DIAGNOSIS — Y929 Unspecified place or not applicable: Secondary | ICD-10-CM | POA: Diagnosis not present

## 2015-12-09 DIAGNOSIS — I10 Essential (primary) hypertension: Secondary | ICD-10-CM | POA: Diagnosis present

## 2015-12-09 DIAGNOSIS — R1013 Epigastric pain: Secondary | ICD-10-CM | POA: Diagnosis present

## 2015-12-09 DIAGNOSIS — R634 Abnormal weight loss: Secondary | ICD-10-CM | POA: Diagnosis present

## 2015-12-09 DIAGNOSIS — Z794 Long term (current) use of insulin: Secondary | ICD-10-CM

## 2015-12-09 DIAGNOSIS — D7389 Other diseases of spleen: Secondary | ICD-10-CM

## 2015-12-09 DIAGNOSIS — E7800 Pure hypercholesterolemia, unspecified: Secondary | ICD-10-CM | POA: Diagnosis present

## 2015-12-09 DIAGNOSIS — E1165 Type 2 diabetes mellitus with hyperglycemia: Secondary | ICD-10-CM

## 2015-12-09 LAB — BASIC METABOLIC PANEL
ANION GAP: 11 (ref 5–15)
ANION GAP: 10 (ref 5–15)
BUN: 25 mg/dL — AB (ref 6–20)
BUN: 20 mg/dL (ref 6–20)
CALCIUM: 9.6 mg/dL (ref 8.9–10.3)
CALCIUM: 9 mg/dL (ref 8.9–10.3)
CO2: 22 mmol/L (ref 22–32)
CO2: 23 mmol/L (ref 22–32)
CREATININE: 1.38 mg/dL — AB (ref 0.61–1.24)
Chloride: 94 mmol/L — ABNORMAL LOW (ref 101–111)
Chloride: 102 mmol/L (ref 101–111)
Creatinine, Ser: 0.77 mg/dL (ref 0.61–1.24)
GFR, EST NON AFRICAN AMERICAN: 60 mL/min — AB (ref >60)
GLUCOSE: 712 mg/dL — AB (ref 65–99)
GLUCOSE: 190 mg/dL — AB (ref 65–99)
POTASSIUM: 4.9 mmol/L (ref 3.5–5.1)
POTASSIUM: 4 mmol/L (ref 3.5–5.1)
SODIUM: 127 mmol/L — AB (ref 135–145)
Sodium: 135 mmol/L (ref 135–145)

## 2015-12-09 LAB — CBC
HEMATOCRIT: 41 % (ref 40.0–52.0)
Hemoglobin: 13.4 g/dL (ref 13.0–18.0)
MCH: 30.2 pg (ref 26.0–34.0)
MCHC: 32.7 g/dL (ref 32.0–36.0)
MCV: 92.2 fL (ref 80.0–100.0)
PLATELETS: 172 10*3/uL (ref 150–440)
RBC: 4.45 MIL/uL (ref 4.40–5.90)
RDW: 13.5 % (ref 11.5–14.5)
WBC: 4.2 10*3/uL (ref 3.8–10.6)

## 2015-12-09 LAB — BLOOD GAS, VENOUS
Acid-base deficit: 1.6 mmol/L (ref 0.0–2.0)
BICARBONATE: 23.7 meq/L (ref 21.0–28.0)
FIO2: 0.21
O2 Saturation: 89.8 %
PH VEN: 7.37 (ref 7.320–7.430)
Patient temperature: 37
pCO2, Ven: 41 mmHg — ABNORMAL LOW (ref 44.0–60.0)
pO2, Ven: 60 mmHg — ABNORMAL HIGH (ref 31.0–45.0)

## 2015-12-09 LAB — TROPONIN I
Troponin I: <0.03 ng/mL (ref <0.03)
Troponin I: <0.03 ng/mL (ref <0.03)

## 2015-12-09 LAB — URINALYSIS COMPLETE WITH MICROSCOPIC (ARMC ONLY)
BACTERIA UA: NONE SEEN
BILIRUBIN URINE: NEGATIVE
KETONES UR: NEGATIVE mg/dL
Leukocytes, UA: NEGATIVE
NITRITE: NEGATIVE
Protein, ur: NEGATIVE mg/dL
Specific Gravity, Urine: 1.026 (ref 1.005–1.030)
Squamous Epithelial / LPF: NONE SEEN
pH: 5 (ref 5.0–8.0)

## 2015-12-09 LAB — GLUCOSE, CAPILLARY
GLUCOSE-CAPILLARY: 404 mg/dL — AB (ref 65–99)
GLUCOSE-CAPILLARY: 249 mg/dL — AB (ref 65–99)
GLUCOSE-CAPILLARY: 206 mg/dL — AB (ref 65–99)
GLUCOSE-CAPILLARY: 147 mg/dL — AB (ref 65–99)
GLUCOSE-CAPILLARY: 153 mg/dL — AB (ref 65–99)
Glucose-Capillary: 442 mg/dL — ABNORMAL HIGH (ref 65–99)
Glucose-Capillary: 314 mg/dL — ABNORMAL HIGH (ref 65–99)
Glucose-Capillary: 277 mg/dL — ABNORMAL HIGH (ref 65–99)
Glucose-Capillary: 242 mg/dL — ABNORMAL HIGH (ref 65–99)

## 2015-12-09 LAB — MRSA PCR SCREENING: MRSA BY PCR: NEGATIVE

## 2015-12-09 LAB — LIPASE, BLOOD: Lipase: 41 U/L (ref 11–51)

## 2015-12-09 LAB — TSH: TSH: 0.399 u[IU]/mL (ref 0.350–4.500)

## 2015-12-09 LAB — TRIGLYCERIDES: Triglycerides: 553 mg/dL — ABNORMAL HIGH (ref <150)

## 2015-12-09 MED ORDER — INSULIN ASPART 100 UNIT/ML ~~LOC~~ SOLN
0.0000 [IU] | Freq: Three times a day (TID) | SUBCUTANEOUS | Status: DC
  Administered 2015-12-09: 1 [IU] via SUBCUTANEOUS
  Administered 2015-12-10: 5 [IU] via SUBCUTANEOUS
  Administered 2015-12-10: 7 [IU] via SUBCUTANEOUS
  Administered 2015-12-10: 5 [IU] via SUBCUTANEOUS
  Administered 2015-12-10: 9 [IU] via SUBCUTANEOUS
  Filled 2015-12-09: qty 5
  Filled 2015-12-09: qty 7
  Filled 2015-12-09: qty 1
  Filled 2015-12-09: qty 5
  Filled 2015-12-09: qty 9

## 2015-12-09 MED ORDER — ONDANSETRON HCL 4 MG/2ML IJ SOLN
4.0000 mg | Freq: Four times a day (QID) | INTRAMUSCULAR | Status: DC | PRN
  Administered 2015-12-10 – 2015-12-11 (×2): 4 mg via INTRAVENOUS
  Filled 2015-12-09 (×2): qty 2

## 2015-12-09 MED ORDER — SODIUM CHLORIDE 0.9 % IV SOLN
INTRAVENOUS | Status: DC
  Administered 2015-12-09: 5.5 [IU]/h via INTRAVENOUS
  Administered 2015-12-09: 3.4 [IU]/h via INTRAVENOUS
  Filled 2015-12-09: qty 2.5

## 2015-12-09 MED ORDER — ONDANSETRON 4 MG PO TBDP: 4.0000 mg | ORAL_TABLET | Freq: Three times a day (TID) | ORAL | Status: DC | PRN

## 2015-12-09 MED ORDER — DEXTROSE-NACL 5-0.45 % IV SOLN
INTRAVENOUS | Status: DC
  Administered 2015-12-09: 18:00:00 via INTRAVENOUS

## 2015-12-09 MED ORDER — SODIUM CHLORIDE 0.9 % IV SOLN
INTRAVENOUS | Status: DC
  Administered 2015-12-09: 19:00:00 via INTRAVENOUS

## 2015-12-09 MED ORDER — ASPIRIN EC 81 MG PO TBEC
81.0000 mg | DELAYED_RELEASE_TABLET | Freq: Every day | ORAL | Status: DC
  Administered 2015-12-09 – 2015-12-11 (×3): 81 mg via ORAL
  Filled 2015-12-09 (×3): qty 1

## 2015-12-09 MED ORDER — SODIUM CHLORIDE 0.9 % IV BOLUS (SEPSIS)
1000.0000 mL | Freq: Once | INTRAVENOUS | Status: AC
  Administered 2015-12-09: 1000 mL via INTRAVENOUS

## 2015-12-09 MED ORDER — INSULIN ASPART 100 UNIT/ML ~~LOC~~ SOLN: 0.0000 [IU] | Freq: Three times a day (TID) | SUBCUTANEOUS | Status: DC

## 2015-12-09 MED ORDER — SODIUM CHLORIDE 0.9% FLUSH
3.0000 mL | Freq: Two times a day (BID) | INTRAVENOUS | Status: DC
  Administered 2015-12-09 – 2015-12-11 (×4): 3 mL via INTRAVENOUS

## 2015-12-09 MED ORDER — SODIUM CHLORIDE 0.9 % IV SOLN
Freq: Once | INTRAVENOUS | Status: AC
  Administered 2015-12-09: 12:00:00 via INTRAVENOUS

## 2015-12-09 MED ORDER — PROMETHAZINE HCL 25 MG PO TABS: 25.0000 mg | ORAL_TABLET | ORAL | Status: DC | PRN

## 2015-12-09 MED ORDER — GLIPIZIDE ER 10 MG PO TB24: 10.0000 mg | ORAL_TABLET | Freq: Every day | ORAL | Status: DC

## 2015-12-09 MED ORDER — LORAZEPAM 2 MG PO TABS
2.0000 mg | ORAL_TABLET | Freq: Every day | ORAL | Status: DC
  Administered 2015-12-09 – 2015-12-10 (×2): 2 mg via ORAL
  Filled 2015-12-09 (×2): qty 1

## 2015-12-09 MED ORDER — SODIUM CHLORIDE 0.9 % IV SOLN: INTRAVENOUS | Status: DC

## 2015-12-09 MED ORDER — OXYCODONE HCL 5 MG PO TABS
30.0000 mg | ORAL_TABLET | ORAL | Status: DC | PRN
  Administered 2015-12-09 – 2015-12-11 (×11): 30 mg via ORAL
  Filled 2015-12-09: qty 6
  Filled 2015-12-09: qty 1
  Filled 2015-12-09 (×8): qty 6
  Filled 2015-12-09: qty 5
  Filled 2015-12-09: qty 6

## 2015-12-09 MED ORDER — ACETAMINOPHEN 325 MG PO TABS: 325.0000 mg | ORAL_TABLET | Freq: Four times a day (QID) | ORAL | Status: DC | PRN

## 2015-12-09 MED ORDER — ONDANSETRON HCL 4 MG/2ML IJ SOLN
4.0000 mg | Freq: Once | INTRAMUSCULAR | Status: AC
  Administered 2015-12-09: 4 mg via INTRAVENOUS
  Filled 2015-12-09: qty 2

## 2015-12-09 MED ORDER — OMEGA-3-ACID ETHYL ESTERS 1 G PO CAPS
1.0000 g | ORAL_CAPSULE | Freq: Two times a day (BID) | ORAL | Status: DC
  Administered 2015-12-09 – 2015-12-11 (×4): 1 g via ORAL
  Filled 2015-12-09 (×4): qty 1

## 2015-12-09 MED ORDER — POLYETHYLENE GLYCOL 3350 17 G PO PACK
1.0000 | PACK | Freq: Every day | ORAL | Status: DC | PRN
  Administered 2015-12-10 – 2015-12-11 (×2): 17 g via ORAL
  Filled 2015-12-09 (×4): qty 1

## 2015-12-09 MED ORDER — HYDROMORPHONE HCL 1 MG/ML IJ SOLN
0.5000 mg | Freq: Once | INTRAMUSCULAR | Status: AC
  Administered 2015-12-09: 0.5 mg via INTRAVENOUS
  Filled 2015-12-09: qty 1

## 2015-12-09 MED ORDER — ONDANSETRON HCL 4 MG PO TABS
4.0000 mg | ORAL_TABLET | Freq: Four times a day (QID) | ORAL | Status: DC | PRN
  Administered 2015-12-10 (×2): 4 mg via ORAL
  Filled 2015-12-09: qty 1

## 2015-12-09 MED ORDER — INSULIN DETEMIR 100 UNIT/ML ~~LOC~~ SOLN
15.0000 [IU] | Freq: Two times a day (BID) | SUBCUTANEOUS | Status: DC
  Filled 2015-12-09: qty 0.15

## 2015-12-09 MED ORDER — DEXTROSE 5 % IV SOLN
1.0000 g | Freq: Every day | INTRAVENOUS | Status: DC
  Administered 2015-12-09: 1 g via INTRAVENOUS
  Filled 2015-12-09 (×2): qty 10

## 2015-12-09 MED ORDER — PREGABALIN 25 MG PO CAPS
50.0000 mg | ORAL_CAPSULE | Freq: Three times a day (TID) | ORAL | Status: DC
  Administered 2015-12-09 – 2015-12-11 (×6): 50 mg via ORAL
  Filled 2015-12-09 (×6): qty 2

## 2015-12-09 MED ORDER — ENOXAPARIN SODIUM 40 MG/0.4ML ~~LOC~~ SOLN
40.0000 mg | SUBCUTANEOUS | Status: DC
  Administered 2015-12-09 – 2015-12-10 (×2): 40 mg via SUBCUTANEOUS
  Filled 2015-12-09 (×3): qty 0.4

## 2015-12-09 NOTE — ED Notes (Signed)
Pt taken to CT at this time.

## 2015-12-09 NOTE — ED Notes (Signed)
Report called to Sandra, RN

## 2015-12-09 NOTE — ED Provider Notes (Signed)
[facility] Emergency Department Provider Note   ____________________________________________  Time seen: I have reviewed the triage vital signs and the triage nursing note.  HISTORY  Chief Complaint Hyperglycemia and Abdominal Pain   Historian Patient  HPI Martin Riddle is a 45 y.o. male with a history of type 2 diabetes as well as pancreatitis and hypertension, who states that he had been on Levemir 36 units twice daily as well as an oral hypoglycemic metformin/a new agent, and these were too expensive to afford and so he has been off of them for a while.  He's had nausea. He's had abdominal pain which is central and crampy. He's had no diarrhea. He has a history of constipation. Denies fevers. Denies chest pain or palpitations. Denies trouble breathing or coughing. States he's had headache and just overall feels weak and unwell.  Symptoms are moderate.  His medications were previously prescribed by endocrinology, Dr.Solum.      Past Medical History  Diagnosis Date  . Diabetes mellitus without complication (HCC)   . Hypertension   . Hypercholesteremia   . Pancreatitis     Patient Active Problem List   Diagnosis Date Noted  . HHNC (hyperglycemic hyperosmolar nonketotic coma) (HCC) 12/09/2015  . Acute renal insufficiency 12/09/2015  . Dehydration 12/09/2015  . Acute pancreatitis 01/27/2015  . Hyponatremia 01/27/2015  . Hyperglycemia 01/27/2015  . Leukocytosis 01/27/2015  . Arthritis 01/16/2015  . Congenital flat foot 01/16/2015  . DDD (degenerative disc disease), lumbar 01/16/2015  . Diabetes mellitus, type 2 (HCC) 01/16/2015  . Acid reflux 01/16/2015  . Hypercholesteremia 01/16/2015  . Gastro-esophageal reflux disease without esophagitis 01/16/2015  . Secondary peripheral neuropathy 01/16/2015  . Type 2 diabetes mellitus with hyperglycemia (HCC) 01/16/2015  . Hypertriglyceridemia 05/23/2014  . Long term current use of insulin (HCC)  05/23/2014    Past Surgical History  Procedure Laterality Date  . Back surgery  2003,2012  . Ankle surgery Left 2001,2003    No current outpatient prescriptions on file.  Allergies Morphine and related  Family History  Problem Relation Age of Onset  . Heart disease Mother   . Diabetes Mother   . Diabetes Sister     Social History Social History  Substance Use Topics  . Smoking status: Former Smoker -- 7 years  . Smokeless tobacco: Current User  . Alcohol Use: No    Review of Systems  Constitutional: Negative for fever. Eyes: Negative for visual changes. ENT: Negative for sore throat. Cardiovascular: Negative for chest pain. Respiratory: Negative for shortness of breath. Gastrointestinal:As per history of present illness. Genitourinary: Negative for dysuria. Musculoskeletal: Negative for back pain. Skin: Negative for rash. Neurological: Positive for headache. 10 point Review of Systems otherwise negative ____________________________________________   PHYSICAL EXAM:  VITAL SIGNS: ED Triage Vitals  Enc Vitals Group     BP 12/09/15 1209 133/81 mmHg     Pulse Rate 12/09/15 1209 107     Resp 12/09/15 1300 13     Temp 12/09/15 1209 98.4 F (36.9 C)     Temp Source 12/09/15 1209 Oral     SpO2 12/09/15 1209 97 %     Weight 12/09/15 1209 252 lb 12.8 oz (114.669 kg)     Height 12/09/15 1209 6' 4.5" (1.943 m)     Head Cir --      Peak Flow --      Pain Score 12/09/15 1210 10     Pain Loc --      Pain Edu? --  Excl. in GC? --      Constitutional: Alert and oriented. Looks like he doesn't feel well, but no acute distress. HEENT   Head: Normocephalic and atraumatic.      Eyes: Conjunctivae are normal. PERRL. Normal extraocular movements.      Ears:         Nose: No congestion/rhinnorhea.   Mouth/Throat: Mucous membranes are dry.   Neck: No stridor. Cardiovascular/Chest: Tachycardic, regular rhythm.  No murmurs, rubs, or gallops. Respiratory:  Normal respiratory effort without tachypnea nor retractions. Breath sounds are clear and equal bilaterally. No wheezes/rales/rhonchi. Gastrointestinal: Soft. No distention, no guarding, no rebound. Mild tenderness maybe more so left-sided. No McBurney's point tenderness. No right upper quadrant tenderness.  Genitourinary/rectal:Deferred Musculoskeletal: Nontender with normal range of motion in all extremities. No joint effusions.  No lower extremity tenderness.  No edema. Neurologic:  Normal speech and language. No gross or focal neurologic deficits are appreciated. Skin:  Skin is warm, dry and intact. No rash noted. Psychiatric: Seems to have somewhat depressed mood. Speech and behavior are normal. Patient exhibits appropriate insight and judgment.  ____________________________________________   EKG I, Governor Rooks, MD, the attending physician have personally viewed and interpreted all ECGs.  105 bpm. Sinus tachycardia. Narrow QRS. Normal axis. Nonspecific ST ____________________________________________  LABS (pertinent positives/negatives)  Labs Reviewed  BASIC METABOLIC PANEL - Abnormal; Notable for the following:    Sodium 127 (*)    Chloride 94 (*)    Glucose, Bld 712 (*)    BUN 25 (*)    Creatinine, Ser 1.38 (*)    GFR calc non Af Amer 60 (*)    All other components within normal limits  URINALYSIS COMPLETEWITH MICROSCOPIC ([facility] ONLY) - Abnormal; Notable for the following:    Color, Urine COLORLESS (*)    APPearance CLEAR (*)    Glucose, UA >500 (*)    Hgb urine dipstick 1+ (*)    All other components within normal limits  GLUCOSE, CAPILLARY - Abnormal; Notable for the following:    Glucose-Capillary >600 (*)    All other components within normal limits  BLOOD GAS, VENOUS - Abnormal; Notable for the following:    pCO2, Ven 41 (*)    pO2, Ven 60.0 (*)    All other components within normal limits  GLUCOSE, CAPILLARY - Abnormal; Notable for the following:     Glucose-Capillary 442 (*)    All other components within normal limits  GLUCOSE, CAPILLARY - Abnormal; Notable for the following:    Glucose-Capillary 404 (*)    All other components within normal limits  HEMOGLOBIN A1C - Abnormal; Notable for the following:    Hgb A1c MFr Bld 13.2 (*)    All other components within normal limits  GLUCOSE, CAPILLARY - Abnormal; Notable for the following:    Glucose-Capillary 314 (*)    All other components within normal limits  BASIC METABOLIC PANEL - Abnormal; Notable for the following:    Sodium 133 (*)    Glucose, Bld 270 (*)    All other components within normal limits  CBC - Abnormal; Notable for the following:    RBC 4.02 (*)    Hemoglobin 12.4 (*)    HCT 36.0 (*)    All other components within normal limits  GLUCOSE, CAPILLARY - Abnormal; Notable for the following:    Glucose-Capillary 277 (*)    All other components within normal limits  TRIGLYCERIDES - Abnormal; Notable for the following:    Triglycerides 553 (*)  All other components within normal limits  GLUCOSE, CAPILLARY - Abnormal; Notable for the following:    Glucose-Capillary 242 (*)    All other components within normal limits  GLUCOSE, CAPILLARY - Abnormal; Notable for the following:    Glucose-Capillary 249 (*)    All other components within normal limits  GLUCOSE, CAPILLARY - Abnormal; Notable for the following:    Glucose-Capillary 206 (*)    All other components within normal limits  BASIC METABOLIC PANEL - Abnormal; Notable for the following:    Glucose, Bld 190 (*)    All other components within normal limits  GLUCOSE, CAPILLARY - Abnormal; Notable for the following:    Glucose-Capillary 147 (*)    All other components within normal limits  GLUCOSE, CAPILLARY - Abnormal; Notable for the following:    Glucose-Capillary 153 (*)    All other components within normal limits  GLUCOSE, CAPILLARY - Abnormal; Notable for the following:    Glucose-Capillary 268 (*)     All other components within normal limits  GLUCOSE, CAPILLARY - Abnormal; Notable for the following:    Glucose-Capillary 289 (*)    All other components within normal limits  MRSA PCR SCREENING  CBC  LIPASE, BLOOD  TROPONIN I  TSH  TROPONIN I  TROPONIN I  TROPONIN I  TROPONIN I  CBG MONITORING, ED    ____________________________________________  RADIOLOGY All Xrays were viewed by me. Imaging interpreted by Radiologist.  None __________________________________________  PROCEDURES  Procedure(s) performed: None  Critical Care performed: CRITICAL CARE Performed by: Governor RooksLORD, Lalah Durango   Total critical care time: 30 minutes  Critical care time was exclusive of separately billable procedures and treating other patients.  Critical care was necessary to treat or prevent imminent or life-threatening deterioration.  Critical care was time spent personally by me on the following activities: development of treatment plan with patient and/or surrogate as well as nursing, discussions with consultants, evaluation of patient's response to treatment, examination of patient, obtaining history from patient or surrogate, ordering and performing treatments and interventions, ordering and review of laboratory studies, ordering and review of radiographic studies, pulse oximetry and re-evaluation of patient's condition.   ____________________________________________   ED COURSE / ASSESSMENT AND PLAN  Pertinent labs & imaging results that were available during my care of the patient were reviewed by me and considered in my medical decision making (see chart for details).    This patient is presenting with hyperglycemia and symptoms consistent with this including fatigue, dehydration, headaches, nausea, and generalized and left-sided abdominal discomfort.  His blood sugar found to be 712 without ketones. Given the patient's severely symptomatic hyperglycemia likely- hyperosmotic/hyperglycemic  state - 2 L normal saline bolus was initiated. His blood work did show acute renal failure with a creatinine 1.38.  EKG shows sinus tachycardia and nonspecific findings with troponin of less than 0.03. And he is nausea vomiting headache and abdominal discomfort are due to hyperglycemia.  I will place him on IV insulin, per glucose stabilizer and admitted to the hospitalist for further management. Ultimately patient is going to need medications that he can afford in order to allow for compliance.    CONSULTATIONS:  Hospitalist for admission   Patient / Family / Caregiver informed of clinical course, medical decision-making process, and agree with plan.     ___________________________________________   FINAL CLINICAL IMPRESSION(S) / ED DIAGNOSES   Final diagnoses:  Type 2 diabetes mellitus with hyperglycemia, with long-term current use of insulin (HCC)  Acute renal failure, unspecified  acute renal failure type (HCC)  Nausea and vomiting due to hyperglycemia              Note: This dictation was prepared with Dragon dictation. Any transcriptional errors that result from this process are unintentional   Governor Rooksebecca Britney Newstrom, MD 12/10/15 1109

## 2015-12-09 NOTE — ED Notes (Addendum)
Patient to ER for c/o hyperglycemia (states CBG was 580's at home). Has h/o pancreatitis x2. States he ran out of insulin (hasn't had any in a couple days). Also c/o pain to upper mid abdomen.

## 2015-12-09 NOTE — H&P (Addendum)
[facility] [place] [facility]   PATIENT NAME: Martin BasemanJames Riddle    MR#:  [id number]  DATE OF BIRTH:  08/24/1970  DATE OF ADMISSION:  12/09/2015  PRIMARY CARE PHYSICIAN: Dortha Kernennis Chrismon, PA   REQUESTING/REFERRING PHYSICIAN:   CHIEF COMPLAINT:   Chief Complaint  Patient presents with  . Hyperglycemia  . Abdominal Pain    HISTORY OF PRESENT ILLNESS: Martin BasemanJames Mui  is a 45 y.o. male with a known history of Diabetes mellitus type 2, obesity, hypertension, hyperlipidemia, history of pancreatitis, who presents to the hospital with complaints of abdominal pain in epigastric area in left lower quadrant pain is described as intermittent, sharp, 10 of the 10 by intensity pain. There is no DVT, cooperative, relating factors except of cough, cough seemed to be an aggravating left lower quadrant discomfort. Patient thought that it could have been atelectasis exacerbation. He presented to the hospital for further evaluation and treatment. His labs revealed hyponatremia, severe hyperglycemia with blood glucose levels in 700s, but not acidosis, hospitalist services were contacted for admission. A admits of approximately 40 pound weight loss over the past 3 weeks since he is not taking his diabetic medications.  PAST MEDICAL HISTORY:   Past Medical History  Diagnosis Date  . Diabetes mellitus without complication (HCC)   . Hypertension   . Hypercholesteremia   . Pancreatitis     PAST SURGICAL HISTORY: Past Surgical History  Procedure Laterality Date  . Back surgery  2003,2012  . Ankle surgery Left 2001,2003    SOCIAL HISTORY:  Social History  Substance Use Topics  . Smoking status: Former Smoker -- 7 years  . Smokeless tobacco: Current User  . Alcohol Use: No    FAMILY HISTORY:  Family History  Problem Relation Age of Onset  . Heart disease Mother   . Diabetes Mother   . Diabetes Sister     DRUG ALLERGIES:  Allergies  Allergen Reactions  . Morphine And  Related Other (See Comments)    headache    Review of Systems  Constitutional: Positive for malaise/fatigue. Negative for fever, chills and weight loss.  HENT: Negative for congestion.   Eyes: Negative for blurred vision and double vision.  Respiratory: Positive for shortness of breath. Negative for cough, sputum production and wheezing.   Cardiovascular: Positive for chest pain and palpitations. Negative for orthopnea, leg swelling and PND.  Gastrointestinal: Positive for nausea, vomiting, abdominal pain and constipation. Negative for diarrhea, blood in stool and melena.  Genitourinary: Positive for urgency and frequency. Negative for dysuria and hematuria.  Musculoskeletal: Negative for falls.  Skin: Negative for rash.  Neurological: Positive for weakness. Negative for dizziness.  Psychiatric/Behavioral: Negative for depression and memory loss. The patient is not nervous/anxious.     MEDICATIONS AT HOME:  Prior to Admission medications   Medication Sig Start Date End Date Taking? Authorizing Provider  acetaminophen (TYLENOL) 325 MG tablet Take 325 mg by mouth every 6 (six) hours as needed for mild pain or moderate pain.     Historical Provider, MD  glipiZIDE (GLUCOTROL XL) 10 MG 24 hr tablet TK 1 T PO QD 12/14/14   Historical Provider, MD  INVOKAMET [phone] MG TABS TK 1 T PO BID 12/24/14   Historical Provider, MD  LEVEMIR 100 UNIT/ML injection INJECT 28 UNITS SQ BID 01/30/15   Delfino LovettVipul Shah, MD  lisinopril (PRINIVIL,ZESTRIL) 40 MG tablet TK 1 T PO QD 12/14/14   Historical Provider, MD  LORazepam (ATIVAN) 2 MG tablet Take 2  mg by mouth at bedtime. 01/05/15   Historical Provider, MD  LYRICA 50 MG capsule TK 1 C PO TID 12/19/14   Historical Provider, MD  omega-3 acid ethyl esters (LOVAZA) 1 G capsule Take 1 capsule (1 g total) by mouth 2 (two) times daily. 01/30/15   Vipul Sherryll Burger, MD  ondansetron (ZOFRAN ODT) 4 MG disintegrating tablet Take 1 tablet (4 mg total) by mouth every 8 (eight) hours as  needed for nausea or vomiting. 01/30/15   Delfino Lovett, MD  oxycodone (ROXICODONE) 30 MG immediate release tablet TK 1 T PO Q 3 H 12/23/14   Historical Provider, MD  OXYCONTIN 40 MG T12A 12 hr tablet TK 1 T PO Q 6  H 12/23/14   Historical Provider, MD  polyethylene glycol powder (GLYCOLAX/MIRALAX) powder MIX 17 GRAMS WITH 8 OUNCES OF FLUID AND DRINK ONCE DAILY 11/17/14   Historical Provider, MD  promethazine (PHENERGAN) 25 MG tablet TK 1 T PO Q 6 H PRF NAUSEA 10/12/14   Historical Provider, MD      PHYSICAL EXAMINATION:   VITAL SIGNS: Blood pressure 129/72, pulse 84, temperature 98.4 F (36.9 C), temperature source Oral, resp. rate 15, height 6' 4.5" (1.943 m), weight 114.669 kg (252 lb 12.8 oz), SpO2 100 %.  GENERAL:  45 y.o.-year-old patient lying in the bed In mild to moderate distress due to abdominal pain, feeling dehydrated and uncomfortable all around.  EYES: Pupils equal, round, reactive to light and accommodation. No scleral icterus. Extraocular muscles intact.  HEENT: Head atraumatic, normocephalic. Oropharynx and nasopharynx clear. Oral mucosa is dry NECK:  Supple, no jugular venous distention. No thyroid enlargement, no tenderness.  LUNGS: Normal breath sounds bilaterally, no wheezing, rales,rhonchi or crepitation. No use of accessory muscles of respiration.  CARDIOVASCULAR: S1, S2 , tachycardic. No murmurs, rubs, or gallops.  ABDOMEN: Soft, tender diffusely, mostly in epigastric area but also in left lower quadrant and suprapubically, with voluntary guarding, nondistended. Bowel sounds present. No organomegaly or mass.  EXTREMITIES: No pedal edema, cyanosis, or clubbing.  NEUROLOGIC: Cranial nerves II through XII are intact. Muscle strength 5/5 in all extremities. Sensation intact. Gait not checked.  PSYCHIATRIC: The patient is alert and oriented x 3.  SKIN: No obvious rash, lesion, or ulcer.   LABORATORY PANEL:   CBC  Recent Labs Lab 12/09/15 1213  WBC 4.2  HGB 13.4  HCT 41.0   PLT 172  MCV 92.2  MCH 30.2  MCHC 32.7  RDW 13.5   ------------------------------------------------------------------------------------------------------------------  Chemistries   Recent Labs Lab 12/09/15 1213  NA 127*  K 4.9  CL 94*  CO2 22  GLUCOSE 712*  BUN 25*  CREATININE 1.38*  CALCIUM 9.6   ------------------------------------------------------------------------------------------------------------------  Cardiac Enzymes  Recent Labs Lab 12/09/15 1213  TROPONINI <0.03   ------------------------------------------------------------------------------------------------------------------  RADIOLOGY: No results found.  EKG: Orders placed or performed during the hospital encounter of 12/09/15  . EKG 12-Lead  . EKG 12-Lead  EKG in the emergency room showed sinus tachycardia at rate of 10 5 bpm, normal axis, no acute ST-T changes  IMPRESSION AND PLAN:  Principal Problem:   HHNC (hyperglycemic hyperosmolar nonketotic coma) (HCC) Active Problems:   Hyponatremia   Acute renal insufficiency   Dehydration  #1 hyperglycemic hyperosmotic state in patient who is off diabetic medications for the past 2 or 3 weeks due to inability to afford them. Admit patient, medical floor. Initiate him on insulin, IV fluids at high rate, follow blood glucose levels hourly, get hemoglobin, A1c level, ,  follow clinically #2. Hyponatremia, likely pseudohyponatremia, follow with rehydration #3. Acute renal insufficiency, follow with rehydration, hold ACE inhibitor, urinalysis is not consistent with urinary tract infection #4. Dehydration, continue high rate IV fluids #5, abdominal pain, diffuse, etiology is unclear, get CT of abdomen and pelvis without contrast to rule out significant pathology.   All the records are reviewed and case discussed with ED provider. Management plans discussed with the patient, family and they are in agreement.  CODE STATUS: Code Status History    Date  Active Date Inactive Code Status Order ID Comments User Context   01/27/2015  8:07 PM 01/30/2015  1:45 PM Full Code [id number]  Katharina Caperima Gino Garrabrant, MD Inpatient       TOTAL Critical care TIME TAKING CARE OF THIS PATIENT: 50 minutes.    Katharina CaperVAICKUTE,Kelcey Wickstrom M.D on 12/09/2015 at 2:19 PM  Between 7am to 6pm - Pager - [phone] After 6pm go to [email or web address] - password [email or web address] [facility]  [facility]  Office  [phone]  CC: Primary care physician; Dortha Kernennis Chrismon, PA

## 2015-12-09 NOTE — Progress Notes (Signed)
On repeat check, CMP was normal, glucose now down to 147, triglycerides still 553 on last check, but will recheck in AM.  CT showed cystic pancreatic tail lesion, recommended MRI with contrast for further diagnostic value, ordered the same.  Will transition off insulin drip to sliding coverage, start diet.  Will not give long acting insulin for now.  If his glucose rises tonight he will need his long acting insulin started tomorrow morning instead of tomorrow night.  He will likely need increased sliding coverage as well, but we will start with sensitive scale for now.  Kristeen MissWILLIS, [employer] FIELDING [facility] 12/09/2015, 9:40 PM

## 2015-12-10 LAB — BASIC METABOLIC PANEL
Anion gap: 6 (ref 5–15)
BUN: 20 mg/dL (ref 6–20)
CHLORIDE: 101 mmol/L (ref 101–111)
CO2: 26 mmol/L (ref 22–32)
Calcium: 8.9 mg/dL (ref 8.9–10.3)
Creatinine, Ser: 0.78 mg/dL (ref 0.61–1.24)
Glucose, Bld: 270 mg/dL — ABNORMAL HIGH (ref 65–99)
POTASSIUM: 4 mmol/L (ref 3.5–5.1)
SODIUM: 133 mmol/L — AB (ref 135–145)

## 2015-12-10 LAB — GLUCOSE, CAPILLARY
Glucose-Capillary: 268 mg/dL — ABNORMAL HIGH (ref 65–99)
Glucose-Capillary: 289 mg/dL — ABNORMAL HIGH (ref 65–99)
Glucose-Capillary: 363 mg/dL — ABNORMAL HIGH (ref 65–99)
Glucose-Capillary: 321 mg/dL — ABNORMAL HIGH (ref 65–99)
Glucose-Capillary: 427 mg/dL — ABNORMAL HIGH (ref 65–99)

## 2015-12-10 LAB — CBC
HEMATOCRIT: 36 % — AB (ref 40.0–52.0)
HEMOGLOBIN: 12.4 g/dL — AB (ref 13.0–18.0)
MCH: 30.8 pg (ref 26.0–34.0)
MCHC: 34.4 g/dL (ref 32.0–36.0)
MCV: 89.7 fL (ref 80.0–100.0)
Platelets: 150 10*3/uL (ref 150–440)
RBC: 4.02 MIL/uL — ABNORMAL LOW (ref 4.40–5.90)
RDW: 13 % (ref 11.5–14.5)
WBC: 4 10*3/uL (ref 3.8–10.6)

## 2015-12-10 LAB — TROPONIN I

## 2015-12-10 LAB — HEMOGLOBIN A1C: Hgb A1c MFr Bld: 13.2 % — ABNORMAL HIGH (ref 4.0–6.0)

## 2015-12-10 MED ORDER — INSULIN STARTER KIT- PEN NEEDLES (ENGLISH)
1.0000 | Freq: Once | Status: AC
  Administered 2015-12-10: 1
  Filled 2015-12-10: qty 1

## 2015-12-10 MED ORDER — INSULIN DETEMIR 100 UNIT/ML ~~LOC~~ SOLN
15.0000 [IU] | Freq: Two times a day (BID) | SUBCUTANEOUS | Status: DC
  Filled 2015-12-10 (×2): qty 0.15

## 2015-12-10 MED ORDER — GEMFIBROZIL 600 MG PO TABS
600.0000 mg | ORAL_TABLET | Freq: Two times a day (BID) | ORAL | Status: DC
  Administered 2015-12-10 – 2015-12-11 (×3): 600 mg via ORAL
  Filled 2015-12-10 (×3): qty 1

## 2015-12-10 MED ORDER — CETYLPYRIDINIUM CHLORIDE 0.05 % MT LIQD: 7.0000 mL | Freq: Two times a day (BID) | OROMUCOSAL | Status: DC

## 2015-12-10 MED ORDER — GLIPIZIDE ER 10 MG PO TB24
10.0000 mg | ORAL_TABLET | Freq: Every day | ORAL | Status: DC
  Administered 2015-12-11: 10 mg via ORAL
  Filled 2015-12-10: qty 1

## 2015-12-10 MED ORDER — DOCUSATE SODIUM 100 MG PO CAPS
100.0000 mg | ORAL_CAPSULE | Freq: Two times a day (BID) | ORAL | Status: DC
  Administered 2015-12-10 – 2015-12-11 (×3): 100 mg via ORAL
  Filled 2015-12-10 (×4): qty 1

## 2015-12-10 MED ORDER — PANTOPRAZOLE SODIUM 40 MG PO TBEC
40.0000 mg | DELAYED_RELEASE_TABLET | Freq: Every day | ORAL | Status: DC
  Administered 2015-12-10 – 2015-12-11 (×2): 40 mg via ORAL
  Filled 2015-12-10 (×2): qty 1

## 2015-12-10 MED ORDER — INSULIN ASPART 100 UNIT/ML ~~LOC~~ SOLN
0.0000 [IU] | Freq: Three times a day (TID) | SUBCUTANEOUS | Status: DC
  Administered 2015-12-11: 11 [IU] via SUBCUTANEOUS
  Administered 2015-12-11: 8 [IU] via SUBCUTANEOUS
  Filled 2015-12-10: qty 8
  Filled 2015-12-10: qty 11

## 2015-12-10 MED ORDER — INSULIN ASPART 100 UNIT/ML ~~LOC~~ SOLN
5.0000 [IU] | Freq: Three times a day (TID) | SUBCUTANEOUS | Status: DC
  Administered 2015-12-10: 5 [IU] via SUBCUTANEOUS
  Filled 2015-12-10: qty 5

## 2015-12-10 MED ORDER — BACITRACIN ZINC 500 UNIT/GM EX OINT
1.0000 | TOPICAL_OINTMENT | Freq: Two times a day (BID) | CUTANEOUS | Status: DC
Start: 2015-12-10 — End: 2015-12-11
  Administered 2015-12-10 – 2015-12-11 (×2): 1 via TOPICAL
  Filled 2015-12-10 (×2): qty 0.9

## 2015-12-10 MED ORDER — INSULIN DETEMIR 100 UNIT/ML ~~LOC~~ SOLN
20.0000 [IU] | Freq: Two times a day (BID) | SUBCUTANEOUS | Status: DC
  Administered 2015-12-10: 20 [IU] via SUBCUTANEOUS
  Filled 2015-12-10 (×3): qty 0.2

## 2015-12-10 MED ORDER — METOCLOPRAMIDE HCL 10 MG PO TABS
10.0000 mg | ORAL_TABLET | Freq: Two times a day (BID) | ORAL | Status: DC
  Administered 2015-12-10 (×2): 10 mg via ORAL
  Filled 2015-12-10 (×2): qty 1

## 2015-12-10 MED ORDER — INSULIN ASPART 100 UNIT/ML ~~LOC~~ SOLN: 8.0000 [IU] | Freq: Three times a day (TID) | SUBCUTANEOUS | Status: DC

## 2015-12-10 MED ORDER — INSULIN DETEMIR 100 UNIT/ML ~~LOC~~ SOLN
30.0000 [IU] | Freq: Two times a day (BID) | SUBCUTANEOUS | Status: DC
  Administered 2015-12-10 – 2015-12-11 (×2): 30 [IU] via SUBCUTANEOUS
  Filled 2015-12-10 (×3): qty 0.3

## 2015-12-10 MED ORDER — INSULIN ASPART 100 UNIT/ML ~~LOC~~ SOLN
10.0000 [IU] | Freq: Three times a day (TID) | SUBCUTANEOUS | Status: DC
  Administered 2015-12-11 (×2): 10 [IU] via SUBCUTANEOUS
  Filled 2015-12-10 (×2): qty 10

## 2015-12-10 NOTE — Progress Notes (Addendum)
Inpatient Diabetes Program Recommendations  AACE/ADA: New Consensus Statement on Inpatient Glycemic Control (2015)  Target Ranges:  Prepandial:   less than 140 mg/dL      Peak postprandial:   less than 180 mg/dL (1-2 hours)      Critically ill patients:  140 - 180 mg/dL   Lab Results  Component Value Date   GLUCAP 363* 12/10/2015   HGBA1C 13.2* 12/09/2015    Review of Glycemic Control:  Results for MACGREGOR, AESCHLIMAN (MRN [id number]) as of 12/10/2015 14:59  Ref. Range 12/09/2015 20:39 12/09/2015 21:42 12/10/2015 02:54 12/10/2015 07:42 12/10/2015 11:46  Glucose-Capillary Latest Ref Range: 65-99 mg/dL 147 (H) 153 (H) 268 (H) 289 (H) 363 (H)    Diabetes history: Type 2 diabetes Outpatient Diabetes medications: Glucotrol XL 10 mg once daily, Levemir 36 units bid Current orders for Inpatient glycemic control:  Levemir 20 units bid, Novolog 5 units tid with meals, Glipizide XL 10 mg daily, Novolog sensitive tid with meals  Inpatient Diabetes Program Recommendations:    Spoke with patient regarding home diabetes management.  His last visit with Dr. Gabriel Carina was 02/06/15 after an admit to the hospital with pancreatitis.  However there is no documentation of him following up after this visit.  He states that he is frustrated with his blood sugars at home because they are always in the 300's even when he takes his insulin.  He has been out of Levemir for the past 2 weeks and states he could not afford the co-pay for the Invokamet.  Patient wonders if he need rapid-acting insulin at home to cover his meals.  He states that most recently he has only been eating once daily in hopes that this would help keep his blood sugars down.  He states that he would like to see Dr. Gabriel Carina again but needs a PCP too. He is concerned over the cost of his medications and would like to know that he can afford them prior to him being d/c'd.  Asked RN to demonstrate use of insulin pen with patient.  Will also order insulin pen starter kit.   Need cost comparison between Levemir vial/versus pens and Novolog pens/ versus vials.    Please consider increasing Levemir to 30 units bid and increase Novolog meal coverage to 10 units tid with meals. Likely will need Novolog at discharge as well.  Consider endocrinology consult while patient is in the hospital or after discharge.  Needs close follow-up with diabetes educator as well.   Thanks, Adah Perl, RN, BC-ADM Inpatient Diabetes Coordinator Pager [phone] (8a-5p)

## 2015-12-10 NOTE — Consult Note (Signed)
WOC wound consult note Reason for Consult: RGT ulcerations (2), chronic, nonhealing. Poorly controlled diabetes.  History of primary family member (mother) with amputation related to diabetes (now deceased).  Wound type:Neuropathic Pressure Ulcer POA: No Measurement:Plantar aspect of RGT: 1.5cm x 0.6cm x 0.2cm full thickness wound with pink, moist wound bed and scant light yellow exudate surrounded by non-viable callous.  Right metatarsal head callous with pinpoint wound in center (red base, no exudate) measures 1.5cm round with 0.4cm elevation.  Left foot, lateral aspect of great toe with 0.2cm round dried and reabsorbed blood blister.  This area debrides off easily and reveals no wound. Wound bed:As described above Drainage (amount, consistency, odor) As described above Periwound: Unremarkable, dry Dressing procedure/placement/frequency: I will provide Nursing with guidance via Orders for twice daily dressings using bacitracin ointment topped with saline moistened gauze and covered with dry gauze, secured with kerlix/tape.  I suggest consultation while in house by podiatry or referral to podiatry or the outpatient wound care center of his choosing for continuing care of his foot ulcers until resolved, and orthotics.  Patient is taught to never go barefoot and to always wear protective shoe wear. Patient reports that his mother had a below the knee amputation and gangrene of the remaining foot before her demise and death a few years ago and that he does not wish to repeat that history.  WOC nursing team will not follow, but will remain available to this patient, the nursing and medical teams.  Please re-consult if needed. Thanks, Ladona MowLaurie Eldena Dede, MSN, RN, GNP, Hans EdenCWOCN, CWON-AP, FAAN  Pager# [phone]

## 2015-12-10 NOTE — Progress Notes (Signed)
Initial Nutrition Assessment  DOCUMENTATION CODES:   Not applicable  INTERVENTION:  -Provided verbal and written education on diabetic diet to patient. Pt with very limited prior education on diabetic diet and very confused regarding basic concepts. Reviewed sources of carbohydrates, basic carb counting, meal planning and label reading. Discouraged consumption of sugar sweetened beverages (pt reports he only drinks sugar free beverages any ways). All questions answered. Pt very receptive to education but with limited grasp of basics of diet, pt would benefit from education as outpatient. Will reinforce diet on follow-up as able -Cater to pt preferences, pt may benefit from smaller more frequent meals   NUTRITION DIAGNOSIS:   Food and nutrition related knowledge deficit related to chronic illness (Diabetes) as evidenced by  (consult for diet education).  GOAL:   Patient will meet greater than or equal to 90% of their needs  MONITOR:   PO intake, Labs, Weight trends (Knowledge)  REASON FOR ASSESSMENT:   Consult, Malnutrition Screening Tool Diet education  ASSESSMENT:    45 yo male admitted with hyperglycemic hyperosmolar nonketotic state after not taking diabetic meds. . Per chart review, pt has lost 40 pounds in 3 weeks. Pt also with acute renal insuffiencey with dehydration. Abdominal pain with CT abdomen showing cystic pancreatic lesion  Pt reports appetite has been down but he tries to eat, eating 2 meals per day at present. Pt c/o abdominal pain, vomiting at times.Good appetite at present, ate 100% of lunch meal tray on visit today  Nutrition-Focused physical exam completed. Findings are WDL for fat depletion, muscle depletion, and edema.   Past Medical History  Diagnosis Date  . Diabetes mellitus without complication (HCC)   . Hypertension   . Hypercholesteremia   . Pancreatitis      Diet Order:  Diet heart healthy/carb modified Room service appropriate?: Yes; Fluid  consistency:: Thin   Energy Intake: recorded po intake 100%   Skin:  Reviewed, no issues  Last BM:  no documented BM   Labs: TG 553, sodium 133  Glucose Profile:   Recent Labs  12/10/15 0254 12/10/15 0742 12/10/15 1146  GLUCAP 268* 289* 363*   Lipid Panel   Lab Results  Component Value Date   HGBA1C 13.2* 12/09/2015   Meds: glucotrol, ss novolog, levemir, reglan  Height:   Ht Readings from Last 1 Encounters:  12/09/15 6\' 4"  (1.93 m)    Weight:  Pt reports 40 pound wt loss in 3-4 weeks (13.7%); per weight encounters, 4.9% wt loss in 1 year  Wt Readings from Last 1 Encounters:  12/09/15 252 lb 10.4 oz (114.6 kg)   Wt Readings from Last 10 Encounters:  12/09/15 252 lb 10.4 oz (114.6 kg)  01/27/15 265 lb (120.203 kg)  03/03/13 284 lb (128.822 kg)  12/26/14 270 lb (122.471 kg)    BMI:  Body mass index is 30.77 kg/(m^2).  Estimated Nutritional Needs:   Kcal:  2200-2500 kcals   Protein:  >/= 110 g   Fluid:  >/= 2.2 L  EDUCATION NEEDS:   Education needs addressed  Romelle Starcherate Blondell Laperle MS, RD, LDN [phone] Pager  [phone] Weekend/On-Call Pager

## 2015-12-10 NOTE — Progress Notes (Signed)
Spoke with Dr Allena KatzPatel regarding patient unable to get MRI per check list. MD is aware and going to speak with patient.

## 2015-12-10 NOTE — Care Management (Signed)
It is reported that patient has not been taking his diabetic medications due to inability to afford them.  Has medication coverage through his [facility] [employer] [facility].  Spoke with patient.  Says he was followed by Dortha [facility]ennis [facility]ismon [facility] . He  Says that his insurance "just quit paying for me to see him."    He says he as not been seen at that practice in 6 months.  [facility] and patient was a no show in august 2016 post hospital follow  up and was last actually seen in 2014 2014. Informed that practice does "as far as we know" still accept medicare [employer].  Patient denies that he has trouble paying for his medications- just has not refilled them because he has not been seen by a physician.  Patient gives many excuses and does not appear to accept responsibility for his medical management.  His answers are vague when asked about practices that he has called for new PCP.  Discussed having his wife bring his [employer] benefits book  to unit and CM can assist with identification of in network providers and meds - then says [facility].   Says his copay for his Invokana for over 200 dollars.  He never discussed the cost of this medication with his PCP.  [facility] because Dr Tedd SiasSolum copay was too high.  Discussed that copay for specialists usually higher.  Discussed if he is not happy with his current medicare plan that it may be of benefit to investigate other plans during open enrollment.  [facility] about [facility] follow up hospital appointment.  Practice does accept patient's insurance.  Obtained order  DM Coordinator consult

## 2015-12-10 NOTE — Progress Notes (Signed)
[facility] - [place] at [facility]   PATIENT NAME: Martin Riddle    MR#:  [id number]  DATE OF BIRTH:  04/27/1971  SUBJECTIVE:  Came in with abd pain found to have uncontrolled sugars. Off insulin gtt Non compliant with meds due to issues with insurance and MD coverage REVIEW OF SYSTEMS:   Review of Systems  Constitutional: Negative for fever, chills and weight loss.  HENT: Negative for ear discharge, ear pain and nosebleeds.   Eyes: Negative for blurred vision, pain and discharge.  Respiratory: Negative for sputum production, shortness of breath, wheezing and stridor.   Cardiovascular: Negative for chest pain, palpitations, orthopnea and PND.  Gastrointestinal: Positive for abdominal pain. Negative for nausea, vomiting and diarrhea.  Genitourinary: Negative for urgency and frequency.  Musculoskeletal: Negative for back pain and joint pain.  Neurological: Negative for sensory change, speech change, focal weakness and weakness.  Psychiatric/Behavioral: Negative for depression and hallucinations. The patient is not nervous/anxious.   All other systems reviewed and are negative.  Tolerating Diet:yes soft Tolerating PT: not needed  DRUG ALLERGIES:   Allergies  Allergen Reactions  . Morphine And Related Other (See Comments)    headache    VITALS:  Blood pressure 130/87, pulse 69, temperature 98.1 F (36.7 C), temperature source Oral, resp. rate 16, height 6\' 4"  (1.93 m), weight 114.6 kg (252 lb 10.4 oz), SpO2 95 %.  PHYSICAL EXAMINATION:   Physical Exam  GENERAL:  45 y.o.-year-old patient lying in the bed with no acute distress. obese EYES: Pupils equal, round, reactive to light and accommodation. No scleral icterus. Extraocular muscles intact.  HEENT: Head atraumatic, normocephalic. Oropharynx and nasopharynx clear.  NECK:  Supple, no jugular venous distention. No thyroid enlargement, no tenderness.  LUNGS: Normal breath sounds bilaterally, no  wheezing, rales, rhonchi. No use of accessory muscles of respiration.  CARDIOVASCULAR: S1, S2 normal. No murmurs, rubs, or gallops.  ABDOMEN: Soft, nontender, nondistended. Bowel sounds present. No organomegaly or mass.  EXTREMITIES: No cyanosis, clubbing or edema b/l.    NEUROLOGIC: Cranial nerves II through XII are intact. No focal Motor or sensory deficits b/l.   PSYCHIATRIC:  patient is alert and oriented x 3.  SKIN: No obvious rash, lesion, or ulcer.   LABORATORY PANEL:  CBC  Recent Labs Lab 12/10/15 0440  WBC 4.0  HGB 12.4*  HCT 36.0*  PLT 150    Chemistries   Recent Labs Lab 12/10/15 0440  NA 133*  K 4.0  CL 101  CO2 26  GLUCOSE 270*  BUN 20  CREATININE 0.78  CALCIUM 8.9   Cardiac Enzymes  Recent Labs Lab 12/10/15 0943  TROPONINI <0.03   RADIOLOGY:  Ct Abdomen Pelvis Wo Contrast  12/09/2015  CLINICAL DATA:  Diabetes mellitus type 2, obesity, hypertension, hyperlipidemia, history of pancreatitis, who presents to the hospital with complaints of abdominal pain in epigastric area in left lower quadrant pain is described as intermittent, sharp, 10 of the 10 by intensity pain. There is cough seemed to be an aggravating left lower quadrant discomfort. He presented to the hospital for further evaluation and treatment. His labs revealed hyponatremia, severe hyperglycemia with blood glucose levels in 700s, but not acidosis. A admits of approximately 40 pound weight loss over the past 3 weeks since he is not taking his diabetic medications. Pt could not tolerate oral cm and has renal insufficiency today EXAM: CT ABDOMEN AND PELVIS WITHOUT CONTRAST TECHNIQUE: Multidetector CT imaging of the abdomen and pelvis was performed following  the standard protocol without IV contrast. COMPARISON:  08/03/2014 FINDINGS: Lower chest:  No acute findings. Hepatobiliary: No mass visualized on this un-enhanced exam. Pancreas: 3.5 cm low-attenuation lesion in the pancreatic tail, new since  previous. This abuts the splenic hilum. No ductal dilatation. No significant adjacent inflammatory/ edematous change. Spleen: 17.7 cm craniocaudal length, enlarged since previous exam. Enlarged venous collateral channels around the greater curvature of the stomach suggest possible splenic vein occlusion, possibly related to the pancreatic lesion which extends towards the splenic hilum. Adrenals/Urinary Tract: No evidence of urolithiasis or hydronephrosis. No definite mass visualized on this un-enhanced exam. Stomach/Bowel: No evidence of obstruction, inflammatory process, or abnormal fluid collections. Normal appendix. Vascular/Lymphatic: Multiple sub cm nodes in the gastrohepatic ligament are more prominent than on prior exam. No retroperitoneal or pelvic adenopathy. No aortic aneurysm. Bilateral pelvic phleboliths. Reproductive: No mass or other significant abnormality. Other: No ascites.  No free air. Musculoskeletal: Changes of instrumented interbody fusion L2-S1. Dorsal stimulator catheter extends up to the T11 level. No fracture or worrisome lesion. Mild spurring in bilateral hips. IMPRESSION: 1. 3.5 cm cystic lesion in the pancreatic tail, new since 08/03/2014. Primary considerations include pseudocyst or cystic neoplasm. When the patient is clinically stable and able to follow directions and hold their breath (preferably as an outpatient) further evaluation with dedicated abdominal MRI with contrast should be considered. 2. Splenic enlargement with enlarged venous collaterals around the greater curvature of the stomach suggesting possible splenic vein occlusion related to the pancreatic cystic lesion which abuts the splenic hilum. These findings are new since the prior scan. See recommendations above. Electronically Signed   By: Corlis Leak  Hassell M.D.   On: 12/09/2015 15:08   ASSESSMENT AND PLAN:   Martin Riddle is a 45 y.o. male with a known history of Diabetes mellitus type 2, obesity, hypertension,  hyperlipidemia, history of pancreatitis, who presents to the hospital with complaints of abdominal pain in epigastric area   #1 hyperglycemic hyperosmotic state in patient who is off diabetic medications for the past 2 or 3 weeks due to inability to afford them. -off iV insulin gtt -A1c 13.2 -Levemir 30 units bid and Aspart 10 units tid, cont glipizide  #2. Hyponatremia, likely pseudohyponatremia, follow with improvement in sugars  #3. Acute renal insufficiency, follow with rehydration, hold ACE inhibitor -d/c IV abxs no evidence of UTI  #4. Dehydration -corrected. Received IVF  #5, abdominal pain, diffuse, -CT abdomen shws 3.5 cm low-attenuation lesion in the pancreatic tail, new since previous. ?Pseudocyst vs ??neoplasm(less likely) -unable to get MRI (has spinal cord stimulator) -will curbside GI or surgery  #6 CM for d/c planning  Case discussed with Care Management/Social Worker. Management plans discussed with the patient, family and they are in agreement.  CODE STATUS: FUll  DVT Prophylaxis: lovenox  TOTAL TIME TAKING CARE OF THIS PATIENT: 40 minutes.  >50% time spent on counselling and coordination of care  POSSIBLE D/C IN 1-2DAYS, DEPENDING ON CLINICAL CONDITION.  Note: This dictation was prepared with Dragon dictation along with smaller phrase technology. Any transcriptional errors that result from this process are unintentional.  Tifani Dack M.D on 12/10/2015 at 6:48 PM  Between 7am to 6pm - Pager - [phone]  After 6pm go to [email or web address] - password [email or web address] [facility]  [facility]  Office  [phone]  CC: Primary care physician; Dortha Kernennis Chrismon, PA

## 2015-12-10 NOTE — Care Management Important Message (Signed)
Important Message  Patient Details  Name: Martin HauserJames C Verner MRN: [id number] Date of Birth: 05/17/1971   Medicare Important Message Given:  Yes    Eber HongGreene, Kaitlinn Iversen R, RN 12/10/2015, 8:12 AM

## 2015-12-11 LAB — GLUCOSE, CAPILLARY
GLUCOSE-CAPILLARY: 360 mg/dL — AB (ref 65–99)
GLUCOSE-CAPILLARY: 266 mg/dL — AB (ref 65–99)
GLUCOSE-CAPILLARY: 313 mg/dL — AB (ref 65–99)

## 2015-12-11 MED ORDER — GEMFIBROZIL 600 MG PO TABS: 600.0000 mg | ORAL_TABLET | Freq: Two times a day (BID) | ORAL | Status: DC

## 2015-12-11 MED ORDER — INSULIN ASPART 100 UNIT/ML ~~LOC~~ SOLN: 10.0000 [IU] | Freq: Three times a day (TID) | SUBCUTANEOUS | Status: DC

## 2015-12-11 MED ORDER — METFORMIN HCL 1000 MG PO TABS: 1000.0000 mg | ORAL_TABLET | Freq: Two times a day (BID) | ORAL | Status: DC

## 2015-12-11 MED ORDER — BACITRACIN ZINC 500 UNIT/GM EX OINT: 1.0000 "application " | TOPICAL_OINTMENT | Freq: Two times a day (BID) | CUTANEOUS | Status: DC

## 2015-12-11 MED ORDER — ASPIRIN 81 MG PO TBEC: 81.0000 mg | DELAYED_RELEASE_TABLET | Freq: Every day | ORAL | Status: DC

## 2015-12-11 NOTE — Care Management (Signed)
CM did not receive a call back from [facility] [place] regarding hospital follow up/new patient visit appointment.  Provided patient with list of offices accepting new patients.  Informed [facility] that [facility].  Discussed with patient that he needs to accept responsibility for managing his medical condition and following through with appointments, etc.

## 2015-12-11 NOTE — Discharge Instructions (Signed)
Keep log of your sugars at home °

## 2015-12-11 NOTE — Discharge Summary (Signed)
[facility] - [place] at [facility]   PATIENT NAME: Darian Cansler    MR#:  [id number]  DATE OF BIRTH:  02-14-1971  DATE OF ADMISSION:  12/09/2015 ADMITTING PHYSICIAN: Katharina Caper, MD  DATE OF DISCHARGE: 12/11/2015  PRIMARY CARE PHYSICIAN: Dortha Kern, PA    ADMISSION DIAGNOSIS:  Diffuse abdominal pain [R10.9] Acute renal failure, unspecified acute renal failure type (HCC) [N17.9] Type 2 diabetes mellitus with hyperglycemia, with long-term current use of insulin (HCC) [E11.65, Z79.4] Nausea and vomiting due to hyperglycemia [R11.2, R73.9]  DISCHARGE DIAGNOSIS:  HONK due to medication non complaince HTN DM-2  SECONDARY DIAGNOSIS:   Past Medical History  Diagnosis Date  . Diabetes mellitus without complication (HCC)   . Hypertension   . Hypercholesteremia   . Pancreatitis     HOSPITAL COURSE:   Jason Frisbee is a 45 y.o. male with a known history of Diabetes mellitus type 2, obesity, hypertension, hyperlipidemia, history of pancreatitis, who presents to the hospital with complaints of abdominal pain in epigastric area   #1 hyperglycemic hyperosmotic state in patient who is off diabetic medications for the past 2 or 3 weeks due to inability to afford them. -off iV insulin gtt -A1c 13.2 -Levemir 30 units bid and Aspart 10 units tid, cont glipizide and po metformin  #2. Hyponatremia, likely pseudohyponatremia, follow with improvement in sugars  #3. Acute renal insufficiency, follow with rehydration-resolved -resume ACE inhibitor  #4. Dehydration -corrected. Received IVF  #5, abdominal pain, diffuse, -CT abdomen shws 3.5 cm low-attenuation lesion in the pancreatic tail, new since previous. Appears like pseudocyst. curbsided and spoke with surgeon. Out pt f/u with CT abdomen recommended in 3-6 months. Pt and wife voiced understanding -unable to get MRI (has spinal cord stimulator) -tolerating  diet  #6 HTN resume lisinopril  #7 Hyper TG On  omega and gemfibrozil  D/c home  CONSULTS OBTAINED:     DRUG ALLERGIES:   Allergies  Allergen Reactions  . Morphine And Related Other (See Comments)    headache    DISCHARGE MEDICATIONS:   Current Discharge Medication List    START taking these medications   Details  aspirin EC 81 MG EC tablet Take 1 tablet (81 mg total) by mouth daily. Qty: 30 tablet, Refills: 0    bacitracin ointment Apply 1 application topically 2 (two) times daily. Qty: 120 g, Refills: 0    insulin aspart (NOVOLOG) 100 UNIT/ML injection Inject 10 Units into the skin 3 (three) times daily with meals. Qty: 10 mL, Refills: 11    metFORMIN (GLUCOPHAGE) 1000 MG tablet Take 1 tablet (1,000 mg total) by mouth 2 (two) times daily with a meal. Qty: 60 tablet, Refills: 1      CONTINUE these medications which have CHANGED   Details  gemfibrozil (LOPID) 600 MG tablet Take 1 tablet (600 mg total) by mouth 2 (two) times daily. Qty: 60 tablet, Refills: 2      CONTINUE these medications which have NOT CHANGED   Details  acetaminophen (TYLENOL) 325 MG tablet Take 325 mg by mouth every 6 (six) hours as needed for mild pain or moderate pain.     glipiZIDE (GLUCOTROL XL) 10 MG 24 hr tablet Take 10 mg by mouth once a day. Refills: 10    LEVEMIR 100 UNIT/ML injection INJECT 28 UNITS SQ BID Qty: 10 mL, Refills: 0    lisinopril (PRINIVIL,ZESTRIL) 40 MG tablet Take 40 mg by mouth once a day Refills: 5    LORazepam (ATIVAN) 2  MG tablet Take 2 mg by mouth at bedtime. Refills: 0    metoCLOPramide (REGLAN) 10 MG tablet Take 10 mg by mouth 2 (two) times daily. *Take with evening meal at supper and at bedtime.* Refills: 5    omeprazole (PRILOSEC) 20 MG capsule Take 20 mg by mouth 2 (two) times daily. Refills: 12    oxycodone (ROXICODONE) 30 MG immediate release tablet TK 1 T PO Q 3 H Refills: 0    polyethylene glycol powder (GLYCOLAX/MIRALAX) powder MIX 17 GRAMS WITH 8 OUNCES OF FLUID AND DRINK ONCE  DAILY Refills: 0    promethazine (PHENERGAN) 25 MG tablet Take 25 mg by mouth every 6 hours as needed for nausea/and or vomiting. Refills: 1      STOP taking these medications     LYRICA 50 MG capsule      omega-3 acid ethyl esters (LOVAZA) 1 G capsule         If you experience worsening of your admission symptoms, develop shortness of breath, life threatening emergency, suicidal or homicidal thoughts you must seek medical attention immediately by calling 911 or calling your MD immediately  if symptoms less severe.  You Must read complete instructions/literature along with all the possible adverse reactions/side effects for all the Medicines you take and that have been prescribed to you. Take any new Medicines after you have completely understood and accept all the possible adverse reactions/side effects.   Please note  You were cared for by a hospitalist during your hospital stay. If you have any questions about your discharge medications or the care you received while you were in the hospital after you are discharged, you can call the unit and asked to speak with the hospitalist on call if the hospitalist that took care of you is not available. Once you are discharged, your primary care physician will handle any further medical issues. Please note that NO REFILLS for any discharge medications will be authorized once you are discharged, as it is imperative that you return to your primary care physician (or establish a relationship with a primary care physician if you do not have one) for your aftercare needs so that they can reassess your need for medications and monitor your lab values. Today   SUBJECTIVE   constipation  VITAL SIGNS:  Blood pressure 113/75, pulse 96, temperature 98.1 F (36.7 C), temperature source Oral, resp. rate 25, height 6\' 4"  (1.93 m), weight 114.6 kg (252 lb 10.4 oz), SpO2 93 %.  I/O:   Intake/Output Summary (Last 24 hours) at 12/11/15 1010 Last data  filed at 12/11/15 1000  Gross per 24 hour  Intake    243 ml  Output   2150 ml  Net  -1907 ml    PHYSICAL EXAMINATION:  GENERAL:  45 y.o.-year-old patient lying in the bed with no acute distress. obese EYES: Pupils equal, round, reactive to light and accommodation. No scleral icterus. Extraocular muscles intact.  HEENT: Head atraumatic, normocephalic. Oropharynx and nasopharynx clear.  NECK:  Supple, no jugular venous distention. No thyroid enlargement, no tenderness.  LUNGS: Normal breath sounds bilaterally, no wheezing, rales,rhonchi or crepitation. No use of accessory muscles of respiration.  CARDIOVASCULAR: S1, S2 normal. No murmurs, rubs, or gallops.  ABDOMEN: Soft, non-tender, non-distended. Bowel sounds present. No organomegaly or mass.  EXTREMITIES: No pedal edema, cyanosis, or clubbing. NEUROLOGIC: Cranial nerves II through XII are intact. Muscle strength 5/5 in all extremities. Sensation intact. Gait not checked.  PSYCHIATRIC: The patient is alert and oriented  x 3.  SKIN: No obvious rash, lesion  DATA REVIEW:   CBC   Recent Labs Lab 12/10/15 0440  WBC 4.0  HGB 12.4*  HCT 36.0*  PLT 150    Chemistries   Recent Labs Lab 12/10/15 0440  NA 133*  K 4.0  CL 101  CO2 26  GLUCOSE 270*  BUN 20  CREATININE 0.78  CALCIUM 8.9    Microbiology Results   Recent Results (from the past 240 hour(s))  MRSA PCR Screening     Status: None   Collection Time: 12/09/15  3:51 PM  Result Value Ref Range Status   MRSA by PCR NEGATIVE NEGATIVE Final    Comment:        The GeneXpert MRSA Assay (FDA approved for NASAL specimens only), is one component of a comprehensive MRSA colonization surveillance program. It is not intended to diagnose MRSA infection nor to guide or monitor treatment for MRSA infections.     RADIOLOGY:  Ct Abdomen Pelvis Wo Contrast  12/09/2015  CLINICAL DATA:  Diabetes mellitus type 2, obesity, hypertension, hyperlipidemia, history of  pancreatitis, who presents to the hospital with complaints of abdominal pain in epigastric area in left lower quadrant pain is described as intermittent, sharp, 10 of the 10 by intensity pain. There is cough seemed to be an aggravating left lower quadrant discomfort. He presented to the hospital for further evaluation and treatment. His labs revealed hyponatremia, severe hyperglycemia with blood glucose levels in 700s, but not acidosis. A admits of approximately 40 pound weight loss over the past 3 weeks since he is not taking his diabetic medications. Pt could not tolerate oral cm and has renal insufficiency today EXAM: CT ABDOMEN AND PELVIS WITHOUT CONTRAST TECHNIQUE: Multidetector CT imaging of the abdomen and pelvis was performed following the standard protocol without IV contrast. COMPARISON:  08/03/2014 FINDINGS: Lower chest:  No acute findings. Hepatobiliary: No mass visualized on this un-enhanced exam. Pancreas: 3.5 cm low-attenuation lesion in the pancreatic tail, new since previous. This abuts the splenic hilum. No ductal dilatation. No significant adjacent inflammatory/ edematous change. Spleen: 17.7 cm craniocaudal length, enlarged since previous exam. Enlarged venous collateral channels around the greater curvature of the stomach suggest possible splenic vein occlusion, possibly related to the pancreatic lesion which extends towards the splenic hilum. Adrenals/Urinary Tract: No evidence of urolithiasis or hydronephrosis. No definite mass visualized on this un-enhanced exam. Stomach/Bowel: No evidence of obstruction, inflammatory process, or abnormal fluid collections. Normal appendix. Vascular/Lymphatic: Multiple sub cm nodes in the gastrohepatic ligament are more prominent than on prior exam. No retroperitoneal or pelvic adenopathy. No aortic aneurysm. Bilateral pelvic phleboliths. Reproductive: No mass or other significant abnormality. Other: No ascites.  No free air. Musculoskeletal: Changes of  instrumented interbody fusion L2-S1. Dorsal stimulator catheter extends up to the T11 level. No fracture or worrisome lesion. Mild spurring in bilateral hips. IMPRESSION: 1. 3.5 cm cystic lesion in the pancreatic tail, new since 08/03/2014. Primary considerations include pseudocyst or cystic neoplasm. When the patient is clinically stable and able to follow directions and hold their breath (preferably as an outpatient) further evaluation with dedicated abdominal MRI with contrast should be considered. 2. Splenic enlargement with enlarged venous collaterals around the greater curvature of the stomach suggesting possible splenic vein occlusion related to the pancreatic cystic lesion which abuts the splenic hilum. These findings are new since the prior scan. See recommendations above. Electronically Signed   By: Corlis Leak  Hassell M.D.   On: 12/09/2015 15:08  Management plans discussed with the patient, family and they are in agreement.  CODE STATUS:     Code Status Orders        Start     Ordered   12/09/15 1551  Full code   Continuous     12/09/15 1550    Code Status History    Date Active Date Inactive Code Status Order ID Comments User Context   01/27/2015  8:07 PM 01/30/2015  1:45 PM Full Code [id number]  Katharina Caper, MD Inpatient      TOTAL TIME TAKING CARE OF THIS PATIENT: 40 minutes.    Sharlot Sturkey M.D on 12/11/2015 at 10:10 AM  Between 7am to 6pm - Pager - [phone] After 6pm go to [email or web address] - password [email or web address] [facility]  [facility]  Office  [phone]  CC: Primary care physician; Dortha Kern, PA

## 2015-12-12 NOTE — Care Management (Signed)
Received call from [facility].  Office will contact patient for appointment

## 2015-12-14 LAB — GLUCOSE, CAPILLARY: Glucose-Capillary: 280 mg/dL — ABNORMAL HIGH (ref 65–99)

## 2016-03-28 ENCOUNTER — Other Ambulatory Visit: Payer: Self-pay | Admitting: Internal Medicine

## 2016-03-28 DIAGNOSIS — K862 Cyst of pancreas: Secondary | ICD-10-CM

## 2016-04-16 ENCOUNTER — Ambulatory Visit: Payer: Medicare Other

## 2016-04-24 ENCOUNTER — Ambulatory Visit
Admission: RE | Admit: 2016-04-24 | Discharge: 2016-04-24 | Disposition: A | Discharge status: Home / Self Care | Payer: Medicare Other | Source: Ambulatory Visit | Attending: Internal Medicine | Admitting: Internal Medicine

## 2016-04-24 ENCOUNTER — Other Ambulatory Visit: Payer: Self-pay | Admitting: Internal Medicine

## 2016-04-24 DIAGNOSIS — R590 Localized enlarged lymph nodes: Secondary | ICD-10-CM | POA: Diagnosis not present

## 2016-04-24 DIAGNOSIS — K862 Cyst of pancreas: Secondary | ICD-10-CM | POA: Insufficient documentation

## 2016-04-24 DIAGNOSIS — I868 Varicose veins of other specified sites: Secondary | ICD-10-CM | POA: Diagnosis not present

## 2016-04-24 DIAGNOSIS — R161 Splenomegaly, not elsewhere classified: Secondary | ICD-10-CM | POA: Diagnosis not present

## 2016-04-24 LAB — POCT I-STAT CREATININE: Creatinine, Ser: 0.9 mg/dL (ref 0.61–1.24)

## 2016-04-24 MED ORDER — IOPAMIDOL (ISOVUE-370) INJECTION 76%
125.0000 mL | Freq: Once | INTRAVENOUS | Status: AC | PRN
  Administered 2016-04-24: 125 mL via INTRAVENOUS

## 2017-01-08 ENCOUNTER — Encounter: Payer: Self-pay | Admitting: *Deleted

## 2017-01-08 ENCOUNTER — Emergency Department: Payer: Medicare Other

## 2017-01-08 ENCOUNTER — Emergency Department
Admission: EM | Admit: 2017-01-08 | Discharge: 2017-01-10 | Disposition: A | Discharge status: Other Acute Inpatient Hospital | Payer: Medicare Other | Attending: Emergency Medicine | Admitting: Emergency Medicine

## 2017-01-08 DIAGNOSIS — F333 Major depressive disorder, recurrent, severe with psychotic symptoms: Secondary | ICD-10-CM

## 2017-01-08 DIAGNOSIS — F1099 Alcohol use, unspecified with unspecified alcohol-induced disorder: Secondary | ICD-10-CM | POA: Diagnosis not present

## 2017-01-08 DIAGNOSIS — E119 Type 2 diabetes mellitus without complications: Secondary | ICD-10-CM | POA: Diagnosis not present

## 2017-01-08 DIAGNOSIS — Z87891 Personal history of nicotine dependence: Secondary | ICD-10-CM | POA: Diagnosis not present

## 2017-01-08 DIAGNOSIS — Z7982 Long term (current) use of aspirin: Secondary | ICD-10-CM | POA: Diagnosis not present

## 2017-01-08 DIAGNOSIS — Z79899 Other long term (current) drug therapy: Secondary | ICD-10-CM | POA: Diagnosis not present

## 2017-01-08 DIAGNOSIS — E7800 Pure hypercholesterolemia, unspecified: Secondary | ICD-10-CM | POA: Diagnosis present

## 2017-01-08 DIAGNOSIS — F10929 Alcohol use, unspecified with intoxication, unspecified: Secondary | ICD-10-CM

## 2017-01-08 DIAGNOSIS — I10 Essential (primary) hypertension: Secondary | ICD-10-CM | POA: Diagnosis not present

## 2017-01-08 DIAGNOSIS — R45851 Suicidal ideations: Secondary | ICD-10-CM

## 2017-01-08 LAB — COMPREHENSIVE METABOLIC PANEL
ALBUMIN: 4.8 g/dL (ref 3.5–5.0)
ALT: 38 U/L (ref 17–63)
AST: 26 U/L (ref 15–41)
Alkaline Phosphatase: 64 U/L (ref 38–126)
Anion gap: 15 (ref 5–15)
BUN: 21 mg/dL — ABNORMAL HIGH (ref 6–20)
CHLORIDE: 98 mmol/L — AB (ref 101–111)
CO2: 22 mmol/L (ref 22–32)
Calcium: 9.8 mg/dL (ref 8.9–10.3)
Creatinine, Ser: 0.94 mg/dL (ref 0.61–1.24)
Glucose, Bld: 206 mg/dL — ABNORMAL HIGH (ref 65–99)
POTASSIUM: 4.4 mmol/L (ref 3.5–5.1)
Sodium: 135 mmol/L (ref 135–145)
Total Bilirubin: 0.7 mg/dL (ref 0.3–1.2)
Total Protein: 8.4 g/dL — ABNORMAL HIGH (ref 6.5–8.1)

## 2017-01-08 LAB — BLOOD GAS, VENOUS
ACID-BASE DEFICIT: 0.5 mmol/L (ref 0.0–2.0)
BICARBONATE: 24.2 mmol/L (ref 20.0–28.0)
O2 Saturation: 74.9 %
PCO2 VEN: 39 mmHg — AB (ref 44.0–60.0)
PH VEN: 7.4 (ref 7.250–7.430)
PO2 VEN: 40 mmHg (ref 32.0–45.0)
Patient temperature: 37

## 2017-01-08 LAB — URINE DRUG SCREEN, QUALITATIVE (ARMC ONLY)
AMPHETAMINES, UR SCREEN: NOT DETECTED
Barbiturates, Ur Screen: NOT DETECTED
Benzodiazepine, Ur Scrn: POSITIVE — AB
CANNABINOID 50 NG, UR ~~LOC~~: NOT DETECTED
COCAINE METABOLITE, UR ~~LOC~~: NOT DETECTED
MDMA (ECSTASY) UR SCREEN: NOT DETECTED
METHADONE SCREEN, URINE: NOT DETECTED
Opiate, Ur Screen: NOT DETECTED
Phencyclidine (PCP) Ur S: NOT DETECTED
TRICYCLIC, UR SCREEN: NOT DETECTED

## 2017-01-08 LAB — URINALYSIS, COMPLETE (UACMP) WITH MICROSCOPIC
Bilirubin Urine: NEGATIVE
GLUCOSE, UA: 150 mg/dL — AB
Ketones, ur: 5 mg/dL — AB
Leukocytes, UA: NEGATIVE
Nitrite: NEGATIVE
PROTEIN: NEGATIVE mg/dL
SQUAMOUS EPITHELIAL / LPF: NONE SEEN
Specific Gravity, Urine: 1.006 (ref 1.005–1.030)
pH: 6 (ref 5.0–8.0)

## 2017-01-08 LAB — CBC
HCT: 46.9 % (ref 40.0–52.0)
HEMOGLOBIN: 16 g/dL (ref 13.0–18.0)
MCH: 30.7 pg (ref 26.0–34.0)
MCHC: 34.1 g/dL (ref 32.0–36.0)
MCV: 90.1 fL (ref 80.0–100.0)
PLATELETS: 299 10*3/uL (ref 150–440)
RBC: 5.21 MIL/uL (ref 4.40–5.90)
RDW: 14.2 % (ref 11.5–14.5)
WBC: 13 10*3/uL — AB (ref 3.8–10.6)

## 2017-01-08 LAB — SALICYLATE LEVEL

## 2017-01-08 LAB — GLUCOSE, CAPILLARY: Glucose-Capillary: 202 mg/dL — ABNORMAL HIGH (ref 65–99)

## 2017-01-08 LAB — ETHANOL: Alcohol, Ethyl (B): 184 mg/dL — ABNORMAL HIGH (ref <5)

## 2017-01-08 LAB — ACETAMINOPHEN LEVEL

## 2017-01-08 MED ORDER — SODIUM CHLORIDE 0.9 % IV BOLUS (SEPSIS)
1000.0000 mL | Freq: Once | INTRAVENOUS | Status: AC
  Administered 2017-01-08: 1000 mL via INTRAVENOUS

## 2017-01-08 MED ORDER — DIPHENHYDRAMINE HCL 50 MG/ML IJ SOLN
50.0000 mg | Freq: Once | INTRAMUSCULAR | Status: AC
  Administered 2017-01-08: 50 mg via INTRAMUSCULAR

## 2017-01-08 MED ORDER — LORAZEPAM 2 MG/ML IJ SOLN
INTRAMUSCULAR | Status: AC
  Administered 2017-01-08: 2 mg via INTRAMUSCULAR
  Filled 2017-01-08: qty 1

## 2017-01-08 MED ORDER — DIPHENHYDRAMINE HCL 50 MG/ML IJ SOLN
INTRAMUSCULAR | Status: AC
  Administered 2017-01-08: 50 mg via INTRAMUSCULAR
  Filled 2017-01-08: qty 1

## 2017-01-08 MED ORDER — METFORMIN HCL 500 MG PO TABS
1000.0000 mg | ORAL_TABLET | Freq: Two times a day (BID) | ORAL | Status: DC
  Administered 2017-01-09 – 2017-01-10 (×3): 1000 mg via ORAL
  Filled 2017-01-08 (×3): qty 2

## 2017-01-08 MED ORDER — LORAZEPAM 2 MG PO TABS
2.0000 mg | ORAL_TABLET | Freq: Once | ORAL | Status: AC
  Administered 2017-01-08: 2 mg via ORAL

## 2017-01-08 MED ORDER — LORAZEPAM 1 MG PO TABS
ORAL_TABLET | ORAL | Status: AC
  Administered 2017-01-08: 2 mg via ORAL
  Filled 2017-01-08: qty 2

## 2017-01-08 MED ORDER — HALOPERIDOL LACTATE 5 MG/ML IJ SOLN
5.0000 mg | Freq: Once | INTRAMUSCULAR | Status: AC
  Administered 2017-01-08: 5 mg via INTRAMUSCULAR

## 2017-01-08 MED ORDER — LORAZEPAM 2 MG/ML IJ SOLN
2.0000 mg | Freq: Once | INTRAMUSCULAR | Status: AC
  Administered 2017-01-08: 2 mg via INTRAMUSCULAR

## 2017-01-08 MED ORDER — ASPIRIN EC 81 MG PO TBEC
81.0000 mg | DELAYED_RELEASE_TABLET | Freq: Every day | ORAL | Status: DC
  Administered 2017-01-09 – 2017-01-10 (×2): 81 mg via ORAL
  Filled 2017-01-08 (×2): qty 1

## 2017-01-08 MED ORDER — HALOPERIDOL LACTATE 5 MG/ML IJ SOLN
INTRAMUSCULAR | Status: AC
  Administered 2017-01-08: 5 mg via INTRAMUSCULAR
  Filled 2017-01-08: qty 1

## 2017-01-08 MED ORDER — GEMFIBROZIL 600 MG PO TABS
600.0000 mg | ORAL_TABLET | Freq: Two times a day (BID) | ORAL | Status: DC
  Administered 2017-01-09 – 2017-01-10 (×3): 600 mg via ORAL
  Filled 2017-01-08 (×3): qty 1

## 2017-01-08 MED ORDER — GLIPIZIDE ER 10 MG PO TB24
10.0000 mg | ORAL_TABLET | Freq: Every day | ORAL | Status: DC
  Administered 2017-01-09 – 2017-01-10 (×2): 10 mg via ORAL
  Filled 2017-01-08 (×2): qty 1

## 2017-01-08 MED ORDER — INSULIN DETEMIR 100 UNIT/ML ~~LOC~~ SOLN
25.0000 [IU] | Freq: Two times a day (BID) | SUBCUTANEOUS | Status: DC
  Administered 2017-01-09 – 2017-01-10 (×3): 25 [IU] via SUBCUTANEOUS
  Filled 2017-01-08 (×4): qty 0.25

## 2017-01-08 NOTE — ED Notes (Signed)
Pt sitting up in bed and standing and moving around. RN asked if pt needed a pillow or more blankets to become comfortable. Pt requested more medication to calm his nerves. Pt also reported feeling hungry. Meal tray provided and diet ginger ale. Pt calm and took medication willingly.

## 2017-01-08 NOTE — ED Notes (Signed)
Pt is standing in room and has taken all vitals equipment off. Pt has flipped bench in room and is asking for the police to come in and "get some." police and RN remain outside room. Family has left ED. MD outside room talking to pt to attempt to calm pt and ask if pt would be willing to take medications.

## 2017-01-08 NOTE — ED Notes (Signed)
Lights dimmed and warm blankets provided pt still answering staff when asked questions but has become drowsy and is verbalizing the desire to fall asleep.

## 2017-01-08 NOTE — ED Notes (Signed)
Pt lying back in bed with eyes closed. Door remains open and ODS remains outside the door. Pt in NAD at this time and appears to be calm at this time.

## 2017-01-08 NOTE — ED Provider Notes (Signed)
[facility] Emergency Department Provider Note   ____________________________________________   First MD Initiated Contact with Patient 01/08/17 1950     (approximate)  I have reviewed the triage vital signs and the nursing notes.   HISTORY  Chief Complaint Alcohol Intoxication  EM caveat: Patient somnolent, agitated at times and unable to answer questions  HPI Martin Riddle is a 46 y.o. male prop by his sister for concerns of alcohol ingestion and making suicidal statements to her. Sr. reports he's been calling her since 5 PM called multiple times, reporting suicidal ideation. She reports he appeared to be intoxicated, acting erratically, reporting he wanted to harm himself, and they're able to assist him into the car and drive him here. On the ride here he attempted to open the door or jump out but was not successful.  Sr. does report he recently ran out of pain medicine for his back, and thinks he began medicating with alcohol   Past Medical History:  Diagnosis Date  . Diabetes mellitus without complication (HCC)   . Hypercholesteremia   . Hypertension   . Pancreatitis     Patient Active Problem List   Diagnosis Date Noted  . HHNC (hyperglycemic hyperosmolar nonketotic coma) (HCC) 12/09/2015  . Acute renal insufficiency 12/09/2015  . Dehydration 12/09/2015  . Acute pancreatitis 01/27/2015  . Hyponatremia 01/27/2015  . Hyperglycemia 01/27/2015  . Leukocytosis 01/27/2015  . Arthritis 01/16/2015  . Congenital flat foot 01/16/2015  . DDD (degenerative disc disease), lumbar 01/16/2015  . Diabetes mellitus, type 2 (HCC) 01/16/2015  . Acid reflux 01/16/2015  . Hypercholesteremia 01/16/2015  . Gastro-esophageal reflux disease without esophagitis 01/16/2015  . Secondary peripheral neuropathy 01/16/2015  . Type 2 diabetes mellitus with hyperglycemia (HCC) 01/16/2015  . Hypertriglyceridemia 05/23/2014  . Long term current use of insulin (HCC)  05/23/2014    Past Surgical History:  Procedure Laterality Date  . ANKLE SURGERY Left 2001,2003  . BACK SURGERY  2003,2012    Prior to Admission medications   Medication Sig Start Date End Date Taking? Authorizing Provider  acetaminophen (TYLENOL) 325 MG tablet Take 325 mg by mouth every 6 (six) hours as needed for mild pain or moderate pain.     [provider]  aspirin EC 81 MG EC tablet Take 1 tablet (81 mg total) by mouth daily. 12/11/15   Enedina FinnerPatel, Sona, MD  bacitracin ointment Apply 1 application topically 2 (two) times daily. 12/11/15   Enedina FinnerPatel, Sona, MD  gemfibrozil (LOPID) 600 MG tablet Take 1 tablet (600 mg total) by mouth 2 (two) times daily. 12/11/15   Enedina FinnerPatel, Sona, MD  glipiZIDE (GLUCOTROL XL) 10 MG 24 hr tablet Take 10 mg by mouth once a day. 12/14/14   [provider]  insulin aspart (NOVOLOG) 100 UNIT/ML injection Inject 10 Units into the skin 3 (three) times daily with meals. 12/11/15   Enedina FinnerPatel, Sona, MD  LEVEMIR 100 UNIT/ML injection INJECT 28 UNITS SQ BID Patient taking differently: Inject 36 Units into the skin 2 (two) times daily.  01/30/15   Delfino LovettShah, Vipul, MD  lisinopril (PRINIVIL,ZESTRIL) 40 MG tablet Take 40 mg by mouth once a day 12/14/14   [provider]  LORazepam (ATIVAN) 2 MG tablet Take 2 mg by mouth at bedtime. 01/05/15   [provider]  metFORMIN (GLUCOPHAGE) 1000 MG tablet Take 1 tablet (1,000 mg total) by mouth 2 (two) times daily with a meal. 12/11/15   Enedina FinnerPatel, Sona, MD  metoCLOPramide (REGLAN) 10 MG tablet Take  10 mg by mouth 2 (two) times daily. *Take with evening meal at supper and at bedtime.* 10/26/15   [provider]  omeprazole (PRILOSEC) 20 MG capsule Take 20 mg by mouth 2 (two) times daily. 11/22/15   [provider]  oxycodone (ROXICODONE) 30 MG immediate release tablet TK 1 T PO Q 3 H 12/23/14   [provider]  polyethylene glycol powder (GLYCOLAX/MIRALAX) powder MIX 17 GRAMS WITH 8 OUNCES OF FLUID AND DRINK  ONCE DAILY 11/17/14   [provider]  promethazine (PHENERGAN) 25 MG tablet Take 25 mg by mouth every 6 hours as needed for nausea/and or vomiting. 10/12/14   [provider]    Allergies Morphine and related  Family History  Problem Relation Age of Onset  . Heart disease Mother   . Diabetes Mother   . Diabetes Sister     Social History Social History  Substance Use Topics  . Smoking status: Former Smoker    Years: 7.00  . Smokeless tobacco: Current User  . Alcohol use No    Review of Systems  EM caveat    ____________________________________________   PHYSICAL EXAM:  VITAL SIGNS: ED Triage Vitals [01/08/17 1953]  Enc Vitals Group     BP 135/88     Pulse Rate 100     Resp 16     Temp 98.1 F (36.7 C)     Temp Source Oral     SpO2 100 %     Weight      Height      Head Circumference      Peak Flow      Pain Score      Pain Loc      Pain Edu?      Excl. in GC?     Constitutional: Somnolent. He'll open eyes to command. There is resting primarily with eyes closed, but will perform purposeful action such as squeezing his sister's hand. Asking for his wife. Moves all extremities with equal strength, but is not able to follow detailed examination. Eyes: Conjunctivae are injected Head: Atraumatic. Cheeks are noted to be slightly erythematous Nose: No congestion/rhinnorhea. Mouth/Throat: Mucous membranes are slightly dry. Neck: No stridor.  No rigidity Cardiovascular: Minimally tachycardic rate, regular rhythm. Grossly normal heart sounds.  Good peripheral circulation. Respiratory: Normal respiratory effort.  No retractions. Lungs CTAB. Protecting airway well. Gastrointestinal: Soft and nontender. No distention. Obesity noted. Musculoskeletal: No lower extremity tenderness nor edema. Neurologic:  Slurred speech and language. No nystagmus noted with gazing to the left or right. No gross focal neurologic deficits are appreciated. No focal weakness  in the facial muscles. Grossly no evidence of acute weakness or unilateral defect, but patient does not participate well on exam due to his somnolence. Skin:  Skin is warm, dry and intact. No rash noted. Some superficial abrasions over the legs that appear to be old. Psychiatric: Patient moderate able to answer psychiatric questions. Family does report that he reported suicidal ideation to them.  ____________________________________________   LABS (all labs ordered are listed, but only abnormal results are displayed)  Labs Reviewed  COMPREHENSIVE METABOLIC PANEL - Abnormal; Notable for the following:       Result Value   Chloride 98 (*)    Glucose, Bld 206 (*)    BUN 21 (*)    Total Protein 8.4 (*)    All other components within normal limits  ETHANOL - Abnormal; Notable for the following:    Alcohol, Ethyl (B) 184 (*)  All other components within normal limits  ACETAMINOPHEN LEVEL - Abnormal; Notable for the following:    Acetaminophen (Tylenol), Serum <10 (*)    All other components within normal limits  CBC - Abnormal; Notable for the following:    WBC 13.0 (*)    All other components within normal limits  URINE DRUG SCREEN, QUALITATIVE ([facility] ONLY) - Abnormal; Notable for the following:    Benzodiazepine, Ur Scrn POSITIVE (*)    All other components within normal limits  BLOOD GAS, VENOUS - Abnormal; Notable for the following:    pCO2, Ven 39 (*)    All other components within normal limits  URINALYSIS, COMPLETE (UACMP) WITH MICROSCOPIC - Abnormal; Notable for the following:    Color, Urine STRAW (*)    APPearance CLEAR (*)    Glucose, UA 150 (*)    Hgb urine dipstick SMALL (*)    Ketones, ur 5 (*)    Bacteria, UA RARE (*)    All other components within normal limits  GLUCOSE, CAPILLARY - Abnormal; Notable for the following:    Glucose-Capillary 202 (*)    All other components within normal limits  SALICYLATE LEVEL  CBG MONITORING, ED  CBG MONITORING, ED  CBG  MONITORING, ED   ____________________________________________  EKG  ED ECG REPORT I, QUALE, MARK, the attending physician, personally viewed and interpreted this ECG.  Date: 01/08/2017 EKG Time: 1955 Rate: 95 Rhythm: normal sinus rhythm QRS Axis: normal Intervals: normal ST/T Wave abnormalities: normal Narrative Interpretation: unremarkable  ____________________________________________  RADIOLOGY  Ct Head Wo Contrast  Result Date: 01/08/2017 CLINICAL DATA:  Confused and uncooperative. EXAM: CT HEAD WITHOUT CONTRAST TECHNIQUE: Contiguous axial images were obtained from the base of the skull through the vertex without intravenous contrast. COMPARISON:  None. FINDINGS: Brain: There is no evidence for acute hemorrhage, hydrocephalus, mass lesion, or abnormal extra-axial fluid collection. No definite CT evidence for acute infarction. Vascular: No hyperdense vessel or unexpected calcification. Skull: No evidence for fracture. No worrisome lytic or sclerotic lesion. Sinuses/Orbits: The visualized paranasal sinuses and mastoid air cells are clear. Visualized portions of the globes and intraorbital fat are unremarkable. Other: None. IMPRESSION: 1. Unremarkable CT evaluation of the brain. Electronically Signed   By: Kennith Center M.D.   On: 01/08/2017 20:37   Dg Chest Portable 1 View  Result Date: 01/08/2017 CLINICAL DATA:  Unsteady on feet.  Confusion. EXAM: PORTABLE CHEST 1 VIEW COMPARISON:  01/27/2015 FINDINGS: 2006 hours. Low lung volumes. The lungs are clear without focal pneumonia, edema, pneumothorax or pleural effusion. The cardiopericardial silhouette is within normal limits for size. The visualized bony structures of the thorax are intact. Telemetry leads overlie the chest. IMPRESSION: No active disease. Electronically Signed   By: Kennith Center M.D.   On: 01/08/2017 20:39    ____________________________________________   PROCEDURES  Procedure(s) performed:  None  Procedures  Critical Care performed: Yes, see critical care note(s)  CRITICAL CARE Performed by: Sharyn Creamer   Total critical care time: 40 minutes  Critical care time was exclusive of separately billable procedures and treating other patients.  Critical care was necessary to treat or prevent imminent or life-threatening deterioration.  Critical care was time spent personally by me on the following activities: development of treatment plan with patient and/or surrogate as well as nursing, discussions with consultants, evaluation of patient's response to treatment, examination of patient, obtaining history from patient or surrogate, ordering and performing treatments and interventions, ordering and review of laboratory studies, ordering and review  of radiographic studies, pulse oximetry and re-evaluation of patient's condition.  ____________________________________________   INITIAL IMPRESSION / ASSESSMENT AND PLAN / ED COURSE  Pertinent labs & imaging results that were available during my care of the patient were reviewed by me and considered in my medical decision making (see chart for details).  Altered mental status. Etiology somewhat unclear, clinical history given by family suggests possibly due to alcohol intoxication and reporting suicidal ideation. Patient placed under involuntary commitment given history provided by family  Although I suspect primarily alcohol intoxication may be to blame, consider broad differential including possible overdose, ingestion, toxic, metabolic, cardiac and neurologic abnormality. No focal neurologic findings. We'll proceed with broad workup including CT of the head, EKG, blood work, Catering manager.  ----------------------------------------- 9:19 PM on 01/08/2017 -----------------------------------------  Evaluation thus far reassuring for acute medical cause for altered mental status. The patient is now ambulating the room, reporting to "just leave  me alone" and that he "don't want to talk to anybody". He is flipped furniture over now, is acting agitated and aggressive towards staff. He appears to be at risk of harming himself or others. He is under involuntary commitment, and this time he is refusing oral medication and unable to calm by verbal and other attempts to calm him. Have ordered intramuscular medication and security present to assist with administration of the patient appears to be escalating and mood. He is able to ambulate with steady gait, but is clearly agitated and upset. Reports he does not need to speak to a psychiatrist. In concern that the patient has an acute psychiatric condition and, likely was attempting to self medicate and possibly suicidal given the report by family of suicidal ideation.   Clinical Course as of Jan 08 2313  Thu Jan 08, 2017  2213 The patient is calm and is more compliant now. No longer exhibiting signs of significant agitation. Placed consult to psychiatry and written orders for several of his home medications.  [MQ]    Clinical Course User Index [MQ] Sharyn Creamer, MD    ----------------------------------------- 11:14 PM on 01/08/2017 -----------------------------------------  Patient now compliant with care. Less anxious and resting. No distress. Ongoing care assigned to Dr. Manson Passey, pending psychiatry consult. Home medications including diabetes regimen ordered. ____________________________________________   FINAL CLINICAL IMPRESSION(S) / ED DIAGNOSES  Final diagnoses:  Alcoholic intoxication with complication (HCC)  Suicidal ideations      NEW MEDICATIONS STARTED DURING THIS VISIT:  New Prescriptions   No medications on file     Note:  This document was prepared using Dragon voice recognition software and may include unintentional dictation errors.     Sharyn Creamer, MD 01/08/17 2315

## 2017-01-08 NOTE — ED Notes (Signed)
Pt has started yelling at family and has grabbed sisters sweater. Pt remains in bed but MD made aware.

## 2017-01-08 NOTE — ED Notes (Signed)
Patient transported to CT 

## 2017-01-08 NOTE — ED Notes (Addendum)
Pt willing to take shots without having to be restrained. Three officers at bedside with three nurses. Pt calm after shots where administered and is sitting on edge of bed. Door is open to room and officers remain outside room.

## 2017-01-08 NOTE — ED Triage Notes (Addendum)
Pt assisted out of vehicle, brought in by sister who reports pt with hx back surgeries, out of pain meds and has been drinking; today talking of hurting self; pt very unsteady on feet, grabbing at sister who reports pt is confused and uncooperative; charge nurse called and pt taken to room 25 accomp [place] [employer] officer; pt assisted to bed and placed on card monitor; pt with eyes open, nonverbal

## 2017-01-08 NOTE — ED Notes (Signed)
Pt's sister Santina Evans(Crissy [phone]) and pt's son Pam Drown(Joshua Rico [phone]) left phone numbers for contact

## 2017-01-09 DIAGNOSIS — F333 Major depressive disorder, recurrent, severe with psychotic symptoms: Secondary | ICD-10-CM | POA: Diagnosis not present

## 2017-01-09 LAB — GLUCOSE, CAPILLARY
GLUCOSE-CAPILLARY: 247 mg/dL — AB (ref 65–99)
GLUCOSE-CAPILLARY: 216 mg/dL — AB (ref 65–99)
Glucose-Capillary: 258 mg/dL — ABNORMAL HIGH (ref 65–99)

## 2017-01-09 MED ORDER — ONDANSETRON 4 MG PO TBDP
ORAL_TABLET | ORAL | Status: AC
  Filled 2017-01-09: qty 1

## 2017-01-09 MED ORDER — LORAZEPAM 1 MG PO TABS
1.0000 mg | ORAL_TABLET | Freq: Two times a day (BID) | ORAL | Status: DC
  Administered 2017-01-09 – 2017-01-10 (×3): 1 mg via ORAL
  Filled 2017-01-09 (×3): qty 1

## 2017-01-09 MED ORDER — OXYCODONE HCL 5 MG PO TABS
30.0000 mg | ORAL_TABLET | ORAL | Status: DC | PRN
  Administered 2017-01-09 – 2017-01-10 (×3): 30 mg via ORAL
  Filled 2017-01-09 (×3): qty 6

## 2017-01-09 MED ORDER — ONDANSETRON 4 MG PO TBDP
4.0000 mg | ORAL_TABLET | Freq: Once | ORAL | Status: AC
  Administered 2017-01-09: 4 mg via ORAL

## 2017-01-09 MED ORDER — GEMFIBROZIL 600 MG PO TABS
600.0000 mg | ORAL_TABLET | Freq: Two times a day (BID) | ORAL | Status: DC
  Filled 2017-01-09 (×3): qty 1

## 2017-01-09 MED ORDER — PROMETHAZINE HCL 25 MG PO TABS: 12.5000 mg | ORAL_TABLET | Freq: Four times a day (QID) | ORAL | Status: DC | PRN

## 2017-01-09 MED ORDER — DULOXETINE HCL 30 MG PO CPEP
30.0000 mg | ORAL_CAPSULE | Freq: Two times a day (BID) | ORAL | Status: DC
  Administered 2017-01-09 – 2017-01-10 (×2): 30 mg via ORAL
  Filled 2017-01-09 (×3): qty 1

## 2017-01-09 MED ORDER — INSULIN ASPART 100 UNIT/ML ~~LOC~~ SOLN
0.0000 [IU] | Freq: Three times a day (TID) | SUBCUTANEOUS | Status: DC
  Administered 2017-01-09 – 2017-01-10 (×2): 8 [IU] via SUBCUTANEOUS
  Filled 2017-01-09 (×2): qty 1

## 2017-01-09 MED ORDER — INSULIN ASPART 100 UNIT/ML ~~LOC~~ SOLN: 3.0000 [IU] | Freq: Three times a day (TID) | SUBCUTANEOUS | Status: DC

## 2017-01-09 MED ORDER — FLUOXETINE HCL 20 MG PO CAPS: 20.0000 mg | ORAL_CAPSULE | Freq: Every day | ORAL | Status: DC

## 2017-01-09 MED ORDER — METOCLOPRAMIDE HCL 5 MG PO TABS
5.0000 mg | ORAL_TABLET | Freq: Three times a day (TID) | ORAL | Status: DC
  Administered 2017-01-09 – 2017-01-10 (×2): 5 mg via ORAL
  Filled 2017-01-09 (×4): qty 1

## 2017-01-09 MED ORDER — INSULIN ASPART 100 UNIT/ML ~~LOC~~ SOLN
18.0000 [IU] | Freq: Three times a day (TID) | SUBCUTANEOUS | Status: DC
  Administered 2017-01-09 – 2017-01-10 (×2): 18 [IU] via SUBCUTANEOUS
  Filled 2017-01-09 (×2): qty 1

## 2017-01-09 MED ORDER — FLUOXETINE HCL 20 MG PO CAPS
ORAL_CAPSULE | ORAL | Status: AC
  Filled 2017-01-09: qty 1

## 2017-01-09 MED ORDER — OXYCODONE HCL 5 MG PO TABS
30.0000 mg | ORAL_TABLET | ORAL | Status: AC
  Administered 2017-01-09: 30 mg via ORAL
  Filled 2017-01-09: qty 6

## 2017-01-09 MED ORDER — BUPROPION HCL ER (XL) 150 MG PO TB24
150.0000 mg | ORAL_TABLET | Freq: Every day | ORAL | Status: DC
  Administered 2017-01-09 – 2017-01-10 (×2): 150 mg via ORAL
  Filled 2017-01-09 (×2): qty 1

## 2017-01-09 MED ORDER — LISINOPRIL 20 MG PO TABS
40.0000 mg | ORAL_TABLET | Freq: Every day | ORAL | Status: DC
  Administered 2017-01-09 – 2017-01-10 (×2): 40 mg via ORAL
  Filled 2017-01-09: qty 4
  Filled 2017-01-09: qty 2

## 2017-01-09 NOTE — Progress Notes (Signed)
Inpatient Diabetes Program Recommendations  AACE/ADA: New Consensus Statement on Inpatient Glycemic Control (2015)  Target Ranges:  Prepandial:   less than 140 mg/dL      Peak postprandial:   less than 180 mg/dL (1-2 hours)      Critically ill patients:  140 - 180 mg/dL   Lab Results  Component Value Date   GLUCAP 202 (H) 01/08/2017   HGBA1C 13.2 (H) 12/09/2015    Review of Glycemic ControlResults for Martin Riddle, Martin Riddle (MRN [id number]) as of 01/09/2017 08:36  Ref. Range 01/08/2017 20:00  Glucose-Capillary Latest Ref Range: 65 - 99 mg/dL 409202 (H)   Diabetes history:  Type 2 diabetes  Outpatient Diabetes medications: Glucotrol 10 mg once a day,  Novolog 10 units tid with meals, Levemir 28 units bid Current orders for Inpatient glycemic control:  Levemir 25 units bid, Glucotrol XL 10 mg daily, Metformin 1000 mg bid  Inpatient Diabetes Program Recommendations:   Note history of diabetes.  Please consider changing diet to CHO modified.  Also consider adding Novolog moderate correction tid with meals and HS.  Thanks, Beryl MeagerJenny Dwan Fennel, RN, BC-ADM Inpatient Diabetes Coordinator Pager [phone] (8a-5p)

## 2017-01-09 NOTE — ED Notes (Signed)
Patient received PM snack. 

## 2017-01-09 NOTE — ED Notes (Signed)

## 2017-01-09 NOTE — ED Notes (Addendum)
Pt given PRN medication for back pain (8/10).  No further needs or concerns at this time. Pt remains calm and cooperative. Maintained on 15 minute checks and observation by security camera for safety.

## 2017-01-09 NOTE — ED Notes (Signed)
Patient still reporting SI. Denies HI at this time. Patient denies history of depression or SI. This RN explained to patient that he was IVC'd and what the plan of care was at this point. Patient agreeable and cooperative at this time.

## 2017-01-09 NOTE — ED Notes (Signed)
Pt calm and cooperative. Pt stated he was preparing for his 9th back surgery. Also dealing with the loss of his sister. He hadn't spoken with her for a couple of years and is now feeling remorseful. Pt endorses SI and VH of dead people. Denies HI. Maintained on 15 minute checks and observation by security camera for safety.

## 2017-01-09 NOTE — Consult Note (Signed)
[place] [facility] Consult   Reason for Consult:  Consult for 46 year old man with a history of diabetes and chronic back pain who comes into the hospital with suicidal ideation and depression Referring Physician:  Eual Fines Patient Identification: KASIR HALLENBECK MRN:  [id number] Principal Diagnosis: Severe recurrent major depression with psychotic features [facility]) Diagnosis:   Patient Active Problem List   Diagnosis Date Noted  . Severe recurrent major depression with psychotic features ([place]) [F33.3] 01/09/2017  . Alcohol abuse [F10.10] 01/09/2017  . Chronic pain [G89.29] 01/09/2017  . HHNC (hyperglycemic hyperosmolar nonketotic coma) ([place]) [E11.01] 12/09/2015  . Acute renal insufficiency [N28.9] 12/09/2015  . Dehydration [E86.0] 12/09/2015  . Acute pancreatitis [K85.90] 01/27/2015  . Hyponatremia [E87.1] 01/27/2015  . Hyperglycemia [R73.9] 01/27/2015  . Leukocytosis [D72.829] 01/27/2015  . Arthritis [M19.90] 01/16/2015  . 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 DDD (degenerative disc disease), lumbar [M51.36] 01/16/2015  . Diabetes mellitus, type 2 ([place]) [E11.9] 01/16/2015  . Acid reflux [K21.9] 01/16/2015  . Hypercholesteremia [E78.00] 01/16/2015  . Gastro-esophageal reflux disease without esophagitis [K21.9] 01/16/2015  . Secondary peripheral neuropathy [G60.9] 01/16/2015  . Type 2 diabetes mellitus with hyperglycemia ([place]) [E11.65] 01/16/2015  . Hypertriglyceridemia [E78.1] 05/23/2014  . Long term current use of insulin ([place]) [Z79.4] 05/23/2014    Total Time spent with patient: 1 hour  Subjective:   AARIV MEDLOCK is a 46 y.o. male patient admitted with "I've been depressed for a long time".  HPI:  This is a 46 year old man brought to the hospital by his family with concerns about his depression. Patient had been voicing suicidal ideation. He was intoxicated on first presentation is now sober. He says he's been depressed for a couple years and it is getting worse. He  feels like his life is terrible and he does not want to live anymore. He does not sleep very well. He feels anxious and worried and negative all the time. He has chronic pain and is frustrated about that. He had a sister who died in Jun 17, 2022 and he and she were estranged at the time and this is weighing on his mind a great deal. He tells me that he normally does not drink but for the last couple days has been drinking heavily in an attempt to control his mood. He also reports he's been having visual hallucinations seeing dead people at times. Patient denies that he is abusing any other drugs although he is on high doses of oxycodone prescribed regularly. Denies homicidal ideation. Not currently receiving any kind of mental health treatment.  Social history: Patient is disabled from chronic back pain and diabetes. He lives with his wife and has 1 adult son who lives at home as well. Has no specific complaints about his wife although he seems to indicate that he doesn't feel that he can talk to her about his depression.  Medical history: He has diabetes and is on insulin as and has had a lot of difficulty controlling it. He sees Dr. Gabriel Carina for outpatient endocrine treatment. Takes insulin up to 5 times a day. Has neuropathy in his feet. Past history of pancreatitis and renal problems. No history of heart disease. Patient has chronic back pain and is maintained on quite high doses of oxycodone by his outpatient doctor.  Substance abuse history: Patient came into the hospital intoxicated with a blood alcohol level of about 180. He says he's been drinking just for the last couple days. He claims that he normally does not drink. Denies any  history of alcohol withdrawal symptoms. He says he does not abuse his prescription medicine and does not abuse any other drugs. Drug screen is only positive for benzodiazepines which is consistent with the Ativan he is prescribed  Past Psychiatric History: Patient has had one  prior psychiatric admission which she said was many years ago. He thinks it might of been at behavioral health Hospital. Past suicide attempts. He says he is currently taking Cymbalta prescribed by his doctor but his understanding is that that is mostly for his neuropathy. Doesn't sound like he's ever been treated for psychotic symptoms or bipolar in the past.  Risk to Self:  significant risk due to major stresses substance abuse depression and suicidal thought Risk to Others:  none identified. Does not report any thoughts of harming anyone else Prior Inpatient Therapy:  he remembers one prior inpatient treatment probably years ago Prior Outpatient Therapy:  not receiving any specific outpatient mental health treatment  Past Medical History:  Past Medical History:  Diagnosis Date  . Diabetes mellitus without complication ([place])   . Hypercholesteremia   . Hypertension   . Pancreatitis     Past Surgical History:  Procedure Laterality Date  . ANKLE SURGERY Left 2001,2003  . BACK SURGERY  2003,2012   Family History:  Family History  Problem Relation Age of Onset  . Heart disease Mother   . Diabetes Mother   . Diabetes Sister    Family Psychiatric  History: He does say that there is an extensive family history of depression and anxiety. He thinks there are members of his family who have committed suicide. Additionally there is a very extensive family history of diabetes and heart disease Social History:  History  Alcohol Use No     History  Drug Use No    Social History   Social History  . Marital status: Married    Spouse name: N/A  . Number of children: N/A  . Years of education: N/A   Social History Main Topics  . Smoking status: Former Smoker    Years: 7.00  . Smokeless tobacco: Current User  . Alcohol use No  . Drug use: No  . Sexual activity: Not on file   Other Topics Concern  . Not on file   Social History Narrative  . No narrative on file   Additional  Social History:    Allergies:   Allergies  Allergen Reactions  . Morphine And Related Other (See Comments)    headache    Labs:  Results for orders placed or performed during the hospital encounter of 01/08/17 (from the past 48 hour(s))  Comprehensive metabolic panel     Status: Abnormal   Collection Time: 01/08/17  7:53 PM  Result Value Ref Range   Sodium 135 135 - 145 mmol/L   Potassium 4.4 3.5 - 5.1 mmol/L   Chloride 98 (L) 101 - 111 mmol/L   CO2 22 22 - 32 mmol/L   Glucose, Bld 206 (H) 65 - 99 mg/dL   BUN 21 (H) 6 - 20 mg/dL   Creatinine, Ser 0.94 0.61 - 1.24 mg/dL   Calcium 9.8 8.9 - 10.3 mg/dL   Total Protein 8.4 (H) 6.5 - 8.1 g/dL   Albumin 4.8 3.5 - 5.0 g/dL   AST 26 15 - 41 U/L   ALT 38 17 - 63 U/L   Alkaline Phosphatase 64 38 - 126 U/L   Total Bilirubin 0.7 0.3 - 1.2 mg/dL   GFR calc non Af  Amer >60 >60 mL/min   GFR calc Af Amer >60 >60 mL/min    Comment: (NOTE) The eGFR has been calculated using the CKD EPI equation. This calculation has not been validated in all clinical situations. eGFR's persistently <60 mL/min signify possible Chronic Kidney Disease.    Anion gap 15 5 - 15  Ethanol     Status: Abnormal   Collection Time: 01/08/17  7:53 PM  Result Value Ref Range   Alcohol, Ethyl (B) 184 (H) <5 mg/dL    Comment:        LOWEST DETECTABLE LIMIT FOR SERUM ALCOHOL IS 5 mg/dL FOR MEDICAL PURPOSES ONLY   Salicylate level     Status: None   Collection Time: 01/08/17  7:53 PM  Result Value Ref Range   Salicylate Lvl <5.4 2.8 - 30.0 mg/dL  Acetaminophen level     Status: Abnormal   Collection Time: 01/08/17  7:53 PM  Result Value Ref Range   Acetaminophen (Tylenol), Serum <10 (L) 10 - 30 ug/mL    Comment:        THERAPEUTIC CONCENTRATIONS VARY SIGNIFICANTLY. A RANGE OF 10-30 ug/mL MAY BE AN EFFECTIVE CONCENTRATION FOR MANY PATIENTS. HOWEVER, SOME ARE BEST TREATED AT CONCENTRATIONS OUTSIDE THIS RANGE. ACETAMINOPHEN CONCENTRATIONS >150 ug/mL AT 4  HOURS AFTER INGESTION AND >50 ug/mL AT 12 HOURS AFTER INGESTION ARE OFTEN ASSOCIATED WITH TOXIC REACTIONS.   cbc     Status: Abnormal   Collection Time: 01/08/17  7:53 PM  Result Value Ref Range   WBC 13.0 (H) 3.8 - 10.6 K/uL   RBC 5.21 4.40 - 5.90 MIL/uL   Hemoglobin 16.0 13.0 - 18.0 g/dL   HCT 46.9 40.0 - 52.0 %   MCV 90.1 80.0 - 100.0 fL   MCH 30.7 26.0 - 34.0 pg   MCHC 34.1 32.0 - 36.0 g/dL   RDW 14.2 11.5 - 14.5 %   Platelets 299 150 - 440 K/uL  Glucose, capillary     Status: Abnormal   Collection Time: 01/08/17  8:00 PM  Result Value Ref Range   Glucose-Capillary 202 (H) 65 - 99 mg/dL  Urine Drug Screen, Qualitative     Status: Abnormal   Collection Time: 01/08/17  8:11 PM  Result Value Ref Range   Tricyclic, Ur Screen NONE DETECTED NONE DETECTED   Amphetamines, Ur Screen NONE DETECTED NONE DETECTED   MDMA (Ecstasy)Ur Screen NONE DETECTED NONE DETECTED   Cocaine Metabolite,Ur [place]us NONE DETECTED NONE DETECTED   Opiate, Ur Screen NONE DETECTED NONE DETECTED   Phencyclidine (PCP) Ur S NONE DETECTED NONE DETECTED   Cannabinoid 50 Ng, Ur [place] NONE DETECTED NONE DETECTED   Barbiturates, Ur Screen NONE DETECTED NONE DETECTED   Benzodiazepine, Ur Scrn POSITIVE (A) NONE DETECTED   Methadone Scn, Ur NONE DETECTED NONE DETECTED    Comment: (NOTE) 008  Tricyclics, urine               Cutoff 1000 ng/mL 200  Amphetamines, urine             Cutoff 1000 ng/mL 300  MDMA (Ecstasy), urine           Cutoff 500 ng/mL 400  Cocaine Metabolite, urine       Cutoff 300 ng/mL 500  Opiate, urine                   Cutoff 300 ng/mL 600  Phencyclidine (PCP), urine      Cutoff 25 ng/mL 700  Cannabinoid, urine  Cutoff 50 ng/mL 800  Barbiturates, urine             Cutoff 200 ng/mL 900  Benzodiazepine, urine           Cutoff 200 ng/mL 1000 Methadone, urine                Cutoff 300 ng/mL 1100 1200 The urine drug screen provides only a preliminary, unconfirmed 1300 analytical test result  and should not be used for non-medical 1400 purposes. Clinical consideration and professional judgment should 1500 be applied to any positive drug screen result due to possible 1600 interfering substances. A more specific alternate chemical method 1700 must be used in order to obtain a confirmed analytical result.  1800 Gas chromato graphy / mass spectrometry (GC/MS) is the preferred 1900 confirmatory method.   Blood gas, venous     Status: Abnormal   Collection Time: 01/08/17  8:11 PM  Result Value Ref Range   pH, Ven 7.40 7.250 - 7.430   pCO2, Ven 39 (L) 44.0 - 60.0 mmHg   pO2, Ven 40.0 32.0 - 45.0 mmHg   Bicarbonate 24.2 20.0 - 28.0 mmol/L   Acid-base deficit 0.5 0.0 - 2.0 mmol/L   O2 Saturation 74.9 %   Patient temperature 37.0    Collection site VENOUS    Sample type VENOUS   Urinalysis, Complete w Microscopic     Status: Abnormal   Collection Time: 01/08/17  8:11 PM  Result Value Ref Range   Color, Urine STRAW (A) YELLOW   APPearance CLEAR (A) CLEAR   Specific Gravity, Urine 1.006 1.005 - 1.030   pH 6.0 5.0 - 8.0   Glucose, UA 150 (A) NEGATIVE mg/dL   Hgb urine dipstick SMALL (A) NEGATIVE   Bilirubin Urine NEGATIVE NEGATIVE   Ketones, ur 5 (A) NEGATIVE mg/dL   Protein, ur NEGATIVE NEGATIVE mg/dL   Nitrite NEGATIVE NEGATIVE   Leukocytes, UA NEGATIVE NEGATIVE   RBC / HPF 0-5 0 - 5 RBC/hpf   WBC, UA 0-5 0 - 5 WBC/hpf   Bacteria, UA RARE (A) NONE SEEN   Squamous Epithelial / LPF NONE SEEN NONE SEEN  Glucose, capillary     Status: Abnormal   Collection Time: 01/09/17  8:53 AM  Result Value Ref Range   Glucose-Capillary 247 (H) 65 - 99 mg/dL    Current Facility-Administered Medications  Medication Dose Route Frequency Provider Last Rate Last Dose  . aspirin EC tablet 81 mg  81 mg Oral Daily Delman Kitten, MD   81 mg at 01/09/17 1031  . FLUoxetine (PROZAC) capsule 20 mg  20 mg Oral Daily Clapacs, John T, MD      . gemfibrozil (LOPID) tablet 600 mg  600 mg Oral BID AC  Delman Kitten, MD   600 mg at 01/09/17 0849  . gemfibrozil (LOPID) tablet 600 mg  600 mg Oral BID AC Clapacs, John T, MD      . glipiZIDE (GLUCOTROL XL) 24 hr tablet 10 mg  10 mg Oral Q breakfast Delman Kitten, MD   10 mg at 01/09/17 0849  . insulin aspart (novoLOG) injection 0-15 Units  0-15 Units Subcutaneous TID WC Clapacs, John T, MD      . insulin aspart (novoLOG) injection 18 Units  18 Units Subcutaneous TID AC Clapacs, John T, MD      . insulin detemir (LEVEMIR) injection 25 Units  25 Units Subcutaneous BID Delman Kitten, MD   25 Units at 01/09/17 1031  . lisinopril (PRINIVIL,ZESTRIL)  tablet 40 mg  40 mg Oral Daily Clapacs, John T, MD      . LORazepam (ATIVAN) tablet 1 mg  1 mg Oral BID Clapacs, John T, MD      . metFORMIN (GLUCOPHAGE) tablet 1,000 mg  1,000 mg Oral BID WC Delman Kitten, MD   1,000 mg at 01/09/17 0842  . metoCLOPramide (REGLAN) tablet 5 mg  5 mg Oral TID AC Clapacs, John T, MD      . oxyCODONE (Oxy IR/ROXICODONE) immediate release tablet 30 mg  30 mg Oral NOW Clapacs, John T, MD      . oxyCODONE (Oxy IR/ROXICODONE) immediate release tablet 30 mg  30 mg Oral Q3H PRN Clapacs, John T, MD      . promethazine (PHENERGAN) tablet 12.5 mg  12.5 mg Oral Q6H PRN Clapacs, Madie Reno, MD       Current Outpatient Prescriptions  Medication Sig Dispense Refill  . acetaminophen (TYLENOL) 325 MG tablet Take 325 mg by mouth every 6 (six) hours as needed for mild pain or moderate pain.     Marland Kitchen aspirin EC 81 MG EC tablet Take 1 tablet (81 mg total) by mouth daily. 30 tablet 0  . bacitracin ointment Apply 1 application topically 2 (two) times daily. 120 g 0  . gemfibrozil (LOPID) 600 MG tablet Take 1 tablet (600 mg total) by mouth 2 (two) times daily. 60 tablet 2  . glipiZIDE (GLUCOTROL XL) 10 MG 24 hr tablet Take 10 mg by mouth once a day.  10  . insulin aspart (NOVOLOG) 100 UNIT/ML injection Inject 10 Units into the skin 3 (three) times daily with meals. 10 mL 11  . LEVEMIR 100 UNIT/ML injection INJECT  28 UNITS SQ BID (Patient taking differently: Inject 36 Units into the skin 2 (two) times daily. ) 10 mL 0  . lisinopril (PRINIVIL,ZESTRIL) 40 MG tablet Take 40 mg by mouth once a day  5  . LORazepam (ATIVAN) 2 MG tablet Take 2 mg by mouth at bedtime.  0  . metFORMIN (GLUCOPHAGE) 1000 MG tablet Take 1 tablet (1,000 mg total) by mouth 2 (two) times daily with a meal. 60 tablet 1  . metoCLOPramide (REGLAN) 10 MG tablet Take 10 mg by mouth 2 (two) times daily. *Take with evening meal at supper and at bedtime.*  5  . omeprazole (PRILOSEC) 20 MG capsule Take 20 mg by mouth 2 (two) times daily.  12  . oxycodone (ROXICODONE) 30 MG immediate release tablet TK 1 T PO Q 3 H  0  . polyethylene glycol powder (GLYCOLAX/MIRALAX) powder MIX 17 GRAMS WITH 8 OUNCES OF FLUID AND DRINK ONCE DAILY  0  . promethazine (PHENERGAN) 25 MG tablet Take 25 mg by mouth every 6 hours as needed for nausea/and or vomiting.  1    Musculoskeletal: Strength & Muscle Tone: within normal limits Gait & Station: normal Patient leans: N/A  Psychiatric Specialty Exam: Physical Exam  Nursing note and vitals reviewed. Constitutional: He appears well-developed and well-nourished.  HENT:  Head: Normocephalic and atraumatic.  Eyes: Pupils are equal, round, and reactive to light. Conjunctivae are normal.  Neck: Normal range of motion.  Cardiovascular: Regular rhythm and normal heart sounds.   Respiratory: Effort normal. No respiratory distress.  GI: Soft.  Musculoskeletal: Normal range of motion.  Neurological: He is alert.  Skin: Skin is warm and dry.  Psychiatric: Judgment normal. His mood appears anxious. His speech is delayed. He is not aggressive, not hyperactive and not combative. He  exhibits a depressed mood. He expresses suicidal ideation. He expresses no suicidal plans. He exhibits abnormal recent memory.    Review of Systems  Constitutional: Positive for malaise/fatigue.  HENT: Negative.   Eyes: Negative.    Respiratory: Negative.   Cardiovascular: Negative.   Gastrointestinal: Negative.   Musculoskeletal: Positive for back pain.  Skin: Negative.   Neurological: Positive for sensory change.  Psychiatric/Behavioral: Positive for depression, hallucinations, memory loss, substance abuse and suicidal ideas. The patient is nervous/anxious and has insomnia.     Blood pressure (!) 143/99, pulse (!) 106, temperature 98.1 F (36.7 C), temperature source Oral, resp. rate 20, SpO2 92 %.There is no height or weight on file to calculate BMI.  General Appearance: Disheveled  Eye Contact:  Fair  Speech:  Slow  Volume:  Decreased  Mood:  Anxious and Depressed  Affect:  Congruent  Thought Process:  Goal Directed  Orientation:  Full (Time, Place, and Person)  Thought Content:  Logical  Suicidal Thoughts:  Yes.  without intent/plan  Homicidal Thoughts:  No  Memory:  Immediate;   Good Recent;   Fair Remote;   Fair  Judgement:  Fair  Insight:  Fair  Psychomotor Activity:  Restlessness  Concentration:  Concentration: Fair  Recall:  [employer] of Knowledge:  Fair  Language:  Fair  Akathisia:  No  Handed:  Right  AIMS (if indicated):     Assets:  Communication Skills Desire for Improvement Financial Resources/Insurance Housing Social Support  ADL's:  Intact  Cognition:  WNL  Sleep:        Treatment Plan Summary: Daily contact with patient to assess and evaluate symptoms and progress in treatment, Medication management and Plan 46 year old man who is describing severe major depression probably recurrent and having hallucinations. Has been having more active suicidal thoughts. Multiple risk factors for suicide. Patient meets criteria for commitment and for inpatient treatment. Recommend and have ordered inpatient treatment. Meanwhile continue his medication for diabetes as well as sliding scale orders and his other medical medications. Continue chronic pain medicine at the confirmed a dose of 30 mg  of oxycodone every 3 hours up to 8 times a day. Ativan confirmed that normally 2 mg twice a day and that will be decreased slightly. Full set of labs will be obtained. EKG already obtained. 15 minute checks in place. Patient does not report symptoms or history that would require alcohol detox orders. Case reviewed with emergency room physician and TTS and nursing.  Disposition: Recommend psychiatric Inpatient admission when medically cleared. Supportive therapy provided about ongoing stressors.  Alethia Berthold, MD 01/09/2017 1:22 PM

## 2017-01-09 NOTE — ED Notes (Signed)
To go to BMU after shift change

## 2017-01-09 NOTE — ED Notes (Signed)
Pt changed out.  Pt ambulatory without help

## 2017-01-09 NOTE — ED Notes (Signed)
Report was received from Amy B., RN; Pt. Verbalizes complaints of having Depression; and distress from multiple medical problems; and stating, "I'm just tired.";  Verbalizes having S.I.; and denies having Hi. Continue to monitor with 15 min. Monitoring.

## 2017-01-09 NOTE — BH Assessment (Signed)
[facility] Toni Amendlapacs.  Attending Physician will be Dr. Ardyth HarpsHernandez.   Patient [facility] GoodwellGwen F.   Intake Paper Work has been signed and placed on patient chart.  ER staff is aware of the admission [name](Luane, ER Sect.; Dr. Derrill KayGoodman, ER MD; Vic RipperAmy B., Patient's Nurse & Mertie ClauseJeanelle, Patient Access).

## 2017-01-09 NOTE — ED Notes (Signed)
Patient resting quietly in room. No noted distress or abnormal behaviors noted. Will continue 15 minute checks and observation by security camera for safety. 

## 2017-01-09 NOTE — BH Assessment (Signed)
Assessment Note  Martin Riddle is an 46 y.o. male who presents to the ER due to family having concerns about his mental and emotional state. Patient has recently started drinking alcohol, as a means to cope with current stressors. Patient sister passed away this past 06/2016 and they haven't spoken in approximately three years. He states, he is having guilt because of it. He states his sleep has decrease, as well as his appetite.  Patient reports of having thoughts of ending his life but no specific plan.  During the interview, the patient was calm, cooperative and pleasant. He was able to provide appropriate answers to the questions. He denies having a history of violence and aggression and no involvement with the legal system. He admits to drinking alcohol and it's a half of gallon a day. He was unable to give the timeframe of how long he has drank like this. "I don't drink like this. This is new. I rarely drank anything till now."  Patient lives in the home with his wife and adult son. He states, he is helping take care of his nieces since the passing of his sister.  Patient denies HI and AV/H.   Diagnosis: Depression  Past Medical History:  Past Medical History:  Diagnosis Date  . Diabetes mellitus without complication (HCC)   . Hypercholesteremia   . Hypertension   . Pancreatitis     Past Surgical History:  Procedure Laterality Date  . ANKLE SURGERY Left 2001,2003  . BACK SURGERY  2003,2012    Family History:  Family History  Problem Relation Age of Onset  . Heart disease Mother   . Diabetes Mother   . Diabetes Sister     Social History:  reports that he has quit smoking. He quit after 7.00 years of use. He uses smokeless tobacco. He reports that he does not drink alcohol or use drugs.  Additional Social History:  Alcohol / Drug Use Pain Medications: See PTA Prescriptions: See PTA Over the Counter: See PTA History of alcohol / drug use?: Yes Longest period of sobriety  (when/how long): Unable to quantify  Negative Consequences of Use: Personal relationships Withdrawal Symptoms:  (none) Substance #1 Name of Substance 1: Alcohol 1 - Last Use / Amount: 01/08/2017  CIWA: CIWA-Ar BP: (!) 165/90 Pulse Rate: 100 COWS:    Allergies:  Allergies  Allergen Reactions  . Morphine And Related Other (See Comments)    headache    Home Medications:  (Not in a hospital admission)  OB/GYN Status:  No LMP for male patient.  General Assessment Data [facility] Assessment: In system Is this a Tele or Face-to-Face Assessment?: Face-to-Face Is this an Initial Assessment or a Re-assessment for this encounter?: Initial Assessment Marital status: Married [place]: n/a Is patient pregnant?: No Pregnancy Status: No Living Arrangements: Spouse/significant other, Children Can pt return to current living arrangement?: Yes Admission Status: Involuntary Is patient capable of signing voluntary admission?: No Referral Source: Self/Family/Friend [place] type[place] [facility]  Medical Screening Exam [facility] Walk-in ONLY) Medical Exam completed: Yes  Crisis Care Plan Living Arrangements: Spouse/significant other, Children Legal Guardian: Other: (Self) Name of Psychiatrist: Reports of none Name of Therapist: Reports of none  Education Status Is patient currently in school?: No Current Grade: n/a Highest grade of school patient has completed: n/a Name of school: n/a Contact person: n/a  Risk to self with the past 6 months Suicidal Ideation: Yes-Currently Present Has patient been a risk to self within the  past 6 months prior to admission? : Yes Suicidal Intent: No-Not Currently/Within Last 6 Months Has patient had any suicidal intent within the past 6 months prior to admission? : No Is patient at risk for suicide?: Yes Suicidal Plan?: No Has patient had any suicidal plan within the past 6 months prior to admission? : No Access to Means:  Yes Specify Access to Suicidal Means: Medications What has been your use of drugs/alcohol within the last 12 months?: Alcohol Previous Attempts/Gestures: No How many times?: 0 Other Self Harm Risks: Alcohol abuse Triggers for Past Attempts: None known Intentional Self Injurious Behavior: None Family Suicide History: No Recent stressful life event(s): Loss (Comment), Other (Comment) Persecutory voices/beliefs?: No Depression: Yes Depression Symptoms: Feeling worthless/self pity, Loss of interest in usual pleasures, Guilt, Fatigue, Isolating, Tearfulness, Insomnia Substance abuse history and/or treatment for substance abuse?: Yes Suicide prevention information given to non-admitted patients: Not applicable  Risk to Others within the past 6 months Homicidal Ideation: No Does patient have any lifetime risk of violence toward others beyond the six months prior to admission? : No Thoughts of Harm to Others: No Current Homicidal Intent: No Current Homicidal Plan: No Access to Homicidal Means: No Identified Victim: Reports of none History of harm to others?: No Assessment of Violence: None Noted Violent Behavior Description: Reports of none Does patient have access to weapons?: No Criminal Charges Pending?: No Does patient have a court date: No Is patient on probation?: No  Psychosis Hallucinations: None noted Delusions: None noted  Mental Status Report Appearance/Hygiene: Unremarkable, In scrubs Eye Contact: Good Motor Activity: Freedom of movement, Unremarkable Speech: Logical/coherent, Unremarkable Level of Consciousness: Alert Mood: Depressed, Empty, Guilty, Helpless, Sad, Pleasant Affect: Appropriate to circumstance, Depressed, Sad Anxiety Level: Minimal Thought Processes: Coherent, Relevant Judgement: Unimpaired Orientation: Person, Place, Time, Situation, Appropriate for developmental age Obsessive Compulsive Thoughts/Behaviors: Minimal  Cognitive  Functioning Concentration: Normal Memory: Remote Intact, Recent Impaired IQ: Average Insight: Poor Impulse Control: Poor Appetite: Fair Weight Loss: 0 Weight Gain: 0 Sleep: Decreased (Trouble staying asleep) Total Hours of Sleep: 4 Vegetative Symptoms: None  ADLScreening [facility] Assessment Services) Patient's cognitive ability adequate to safely complete daily activities?: Yes Patient able to express need for assistance with ADLs?: Yes Independently performs ADLs?: Yes (appropriate for developmental age)  Prior Inpatient Therapy Prior Inpatient Therapy: No Prior Therapy Dates: Reports of none Prior Therapy Facilty/Provider(s): Reports of none Reason for Treatment: Reports of none  Prior Outpatient Therapy Prior Outpatient Therapy: No Prior Therapy Dates: Reports of none Prior Therapy Facilty/Provider(s): Reports of none Reason for Treatment: Reports of none Does patient have an ACCT team?: No Does patient have Intensive In-House Services?  : No Does patient have [facility] services? : No Does patient have P4CC services?: No  ADL Screening (condition at time of admission) Patient's cognitive ability adequate to safely complete daily activities?: Yes Is the patient deaf or have difficulty hearing?: No Does the patient have difficulty seeing, even when wearing glasses/contacts?: No Does the patient have difficulty concentrating, remembering, or making decisions?: No Patient able to express need for assistance with ADLs?: Yes Does the patient have difficulty dressing or bathing?: No Independently performs ADLs?: Yes (appropriate for developmental age) Does the patient have difficulty walking or climbing stairs?: No Weakness of Legs: None Weakness of Arms/Hands: None  Home Assistive Devices/Equipment Home Assistive Devices/Equipment: None  Therapy Consults (therapy consults require a physician order) PT Evaluation Needed: No OT Evalulation Needed: No SLP Evaluation Needed:  No Abuse/Neglect Assessment (Assessment to be  complete while patient is alone) Physical Abuse: Denies Verbal Abuse: Denies Sexual Abuse: Denies Exploitation of patient/patient's resources: Denies Self-Neglect: Denies Values / Beliefs Cultural Requests During Hospitalization: None Spiritual Requests During Hospitalization: None Consults Spiritual Care Consult Needed: No Social Work Consult Needed: No      Additional Information 1:1 In Past 12 Months?: No CIRT Risk: No Elopement Risk: No Does patient have medical clearance?: Yes  Child/Adolescent Assessment Running Away Risk: Denies (Patient is an adult)  Disposition:  Disposition Initial Assessment Completed for this Encounter: Yes Disposition of Patient: Other dispositions (ER MD Ordered Psych Consult)  On Site Evaluation by:   Reviewed with Physician:    Lilyan Gilfordalvin J. Mozelle Remlinger MS, LCAS, LPC, NCC, CCSI Therapeutic Triage Specialist 01/09/2017 6:21 PM

## 2017-01-10 ENCOUNTER — Inpatient Hospital Stay
Admission: AD | Admit: 2017-01-10 | Discharge: 2017-01-15 | DRG: 885 | Disposition: A | Discharge status: Home / Self Care | Payer: Medicare Other | Attending: Psychiatry | Admitting: Psychiatry

## 2017-01-10 DIAGNOSIS — F1099 Alcohol use, unspecified with unspecified alcohol-induced disorder: Secondary | ICD-10-CM | POA: Diagnosis not present

## 2017-01-10 DIAGNOSIS — M549 Dorsalgia, unspecified: Secondary | ICD-10-CM | POA: Diagnosis present

## 2017-01-10 DIAGNOSIS — E1140 Type 2 diabetes mellitus with diabetic neuropathy, unspecified: Secondary | ICD-10-CM | POA: Diagnosis present

## 2017-01-10 DIAGNOSIS — K3184 Gastroparesis: Secondary | ICD-10-CM | POA: Diagnosis present

## 2017-01-10 DIAGNOSIS — K5900 Constipation, unspecified: Secondary | ICD-10-CM | POA: Diagnosis present

## 2017-01-10 DIAGNOSIS — Z7982 Long term (current) use of aspirin: Secondary | ICD-10-CM

## 2017-01-10 DIAGNOSIS — F419 Anxiety disorder, unspecified: Secondary | ICD-10-CM | POA: Diagnosis present

## 2017-01-10 DIAGNOSIS — I10 Essential (primary) hypertension: Secondary | ICD-10-CM | POA: Diagnosis present

## 2017-01-10 DIAGNOSIS — G4700 Insomnia, unspecified: Secondary | ICD-10-CM | POA: Diagnosis present

## 2017-01-10 DIAGNOSIS — E1143 Type 2 diabetes mellitus with diabetic autonomic (poly)neuropathy: Secondary | ICD-10-CM | POA: Diagnosis present

## 2017-01-10 DIAGNOSIS — Z885 Allergy status to narcotic agent status: Secondary | ICD-10-CM

## 2017-01-10 DIAGNOSIS — G8929 Other chronic pain: Secondary | ICD-10-CM | POA: Diagnosis present

## 2017-01-10 DIAGNOSIS — Z794 Long term (current) use of insulin: Secondary | ICD-10-CM

## 2017-01-10 DIAGNOSIS — Z833 Family history of diabetes mellitus: Secondary | ICD-10-CM

## 2017-01-10 DIAGNOSIS — Z79899 Other long term (current) drug therapy: Secondary | ICD-10-CM | POA: Diagnosis not present

## 2017-01-10 DIAGNOSIS — F333 Major depressive disorder, recurrent, severe with psychotic symptoms: Secondary | ICD-10-CM | POA: Diagnosis not present

## 2017-01-10 DIAGNOSIS — E781 Pure hyperglyceridemia: Secondary | ICD-10-CM | POA: Diagnosis present

## 2017-01-10 DIAGNOSIS — F1010 Alcohol abuse, uncomplicated: Secondary | ICD-10-CM | POA: Diagnosis present

## 2017-01-10 DIAGNOSIS — M5136 Other intervertebral disc degeneration, lumbar region: Secondary | ICD-10-CM | POA: Diagnosis present

## 2017-01-10 DIAGNOSIS — K219 Gastro-esophageal reflux disease without esophagitis: Secondary | ICD-10-CM | POA: Diagnosis present

## 2017-01-10 DIAGNOSIS — Z87891 Personal history of nicotine dependence: Secondary | ICD-10-CM

## 2017-01-10 DIAGNOSIS — M51369 Other intervertebral disc degeneration, lumbar region without mention of lumbar back pain or lower extremity pain: Secondary | ICD-10-CM | POA: Diagnosis present

## 2017-01-10 DIAGNOSIS — Z818 Family history of other mental and behavioral disorders: Secondary | ICD-10-CM | POA: Diagnosis not present

## 2017-01-10 DIAGNOSIS — F4310 Post-traumatic stress disorder, unspecified: Secondary | ICD-10-CM | POA: Diagnosis present

## 2017-01-10 DIAGNOSIS — Y906 Blood alcohol level of 120-199 mg/100 ml: Secondary | ICD-10-CM | POA: Diagnosis present

## 2017-01-10 DIAGNOSIS — E7800 Pure hypercholesterolemia, unspecified: Secondary | ICD-10-CM | POA: Diagnosis present

## 2017-01-10 DIAGNOSIS — E119 Type 2 diabetes mellitus without complications: Secondary | ICD-10-CM

## 2017-01-10 LAB — GLUCOSE, CAPILLARY
GLUCOSE-CAPILLARY: 223 mg/dL — AB (ref 65–99)
GLUCOSE-CAPILLARY: 194 mg/dL — AB (ref 65–99)
Glucose-Capillary: 277 mg/dL — ABNORMAL HIGH (ref 65–99)

## 2017-01-10 MED ORDER — ALUM & MAG HYDROXIDE-SIMETH 200-200-20 MG/5ML PO SUSP: 30.0000 mL | ORAL | Status: DC | PRN

## 2017-01-10 MED ORDER — METFORMIN HCL 500 MG PO TABS
1000.0000 mg | ORAL_TABLET | Freq: Two times a day (BID) | ORAL | Status: DC
  Administered 2017-01-10 – 2017-01-15 (×9): 1000 mg via ORAL
  Filled 2017-01-10 (×9): qty 2

## 2017-01-10 MED ORDER — LISINOPRIL 20 MG PO TABS
40.0000 mg | ORAL_TABLET | Freq: Every day | ORAL | Status: DC
  Administered 2017-01-10 – 2017-01-12 (×3): 40 mg via ORAL
  Filled 2017-01-10 (×4): qty 2

## 2017-01-10 MED ORDER — ASPIRIN EC 81 MG PO TBEC
81.0000 mg | DELAYED_RELEASE_TABLET | Freq: Every day | ORAL | Status: DC
  Administered 2017-01-10 – 2017-01-15 (×6): 81 mg via ORAL
  Filled 2017-01-10 (×6): qty 1

## 2017-01-10 MED ORDER — GEMFIBROZIL 600 MG PO TABS
600.0000 mg | ORAL_TABLET | Freq: Two times a day (BID) | ORAL | Status: DC
  Administered 2017-01-10 – 2017-01-15 (×9): 600 mg via ORAL
  Filled 2017-01-10 (×11): qty 1

## 2017-01-10 MED ORDER — ACETAMINOPHEN 325 MG PO TABS: 650.0000 mg | ORAL_TABLET | Freq: Four times a day (QID) | ORAL | Status: DC | PRN

## 2017-01-10 MED ORDER — LORAZEPAM 1 MG PO TABS
1.0000 mg | ORAL_TABLET | Freq: Two times a day (BID) | ORAL | Status: DC
  Administered 2017-01-10 – 2017-01-12 (×4): 1 mg via ORAL
  Filled 2017-01-10 (×4): qty 1

## 2017-01-10 MED ORDER — ASPIRIN 81 MG PO CHEW
CHEWABLE_TABLET | ORAL | Status: AC
  Filled 2017-01-10: qty 1

## 2017-01-10 MED ORDER — METOCLOPRAMIDE HCL 5 MG PO TABS
5.0000 mg | ORAL_TABLET | Freq: Three times a day (TID) | ORAL | Status: DC
  Administered 2017-01-10 – 2017-01-12 (×6): 5 mg via ORAL
  Filled 2017-01-10 (×9): qty 1

## 2017-01-10 MED ORDER — GLIPIZIDE ER 10 MG PO TB24
10.0000 mg | ORAL_TABLET | Freq: Every day | ORAL | Status: DC
  Administered 2017-01-11 – 2017-01-15 (×5): 10 mg via ORAL
  Filled 2017-01-10 (×5): qty 1

## 2017-01-10 MED ORDER — TRAZODONE HCL 100 MG PO TABS
100.0000 mg | ORAL_TABLET | Freq: Every evening | ORAL | Status: DC | PRN
Start: 2017-01-10 — End: 2017-01-11
  Administered 2017-01-10: 100 mg via ORAL
  Filled 2017-01-10: qty 1

## 2017-01-10 MED ORDER — OXYCODONE HCL 5 MG PO TABS
30.0000 mg | ORAL_TABLET | ORAL | Status: DC | PRN
  Administered 2017-01-10 – 2017-01-12 (×11): 30 mg via ORAL
  Filled 2017-01-10 (×14): qty 6

## 2017-01-10 MED ORDER — INSULIN ASPART 100 UNIT/ML ~~LOC~~ SOLN
18.0000 [IU] | Freq: Three times a day (TID) | SUBCUTANEOUS | Status: DC
  Administered 2017-01-10 – 2017-01-12 (×6): 18 [IU] via SUBCUTANEOUS
  Filled 2017-01-10: qty 1

## 2017-01-10 MED ORDER — INSULIN DETEMIR 100 UNIT/ML ~~LOC~~ SOLN
25.0000 [IU] | Freq: Two times a day (BID) | SUBCUTANEOUS | Status: DC
  Administered 2017-01-10 – 2017-01-12 (×4): 25 [IU] via SUBCUTANEOUS
  Filled 2017-01-10 (×5): qty 0.25

## 2017-01-10 MED ORDER — PROMETHAZINE HCL 25 MG PO TABS
12.5000 mg | ORAL_TABLET | Freq: Four times a day (QID) | ORAL | Status: DC | PRN
  Administered 2017-01-11 – 2017-01-13 (×3): 12.5 mg via ORAL
  Filled 2017-01-10 (×3): qty 1

## 2017-01-10 MED ORDER — INSULIN ASPART 100 UNIT/ML ~~LOC~~ SOLN
0.0000 [IU] | Freq: Three times a day (TID) | SUBCUTANEOUS | Status: DC
  Administered 2017-01-10: 5 [IU] via SUBCUTANEOUS
  Administered 2017-01-10: 3 [IU] via SUBCUTANEOUS
  Administered 2017-01-11 (×2): 5 [IU] via SUBCUTANEOUS
  Administered 2017-01-11 – 2017-01-12 (×2): 11 [IU] via SUBCUTANEOUS
  Administered 2017-01-12: 8 [IU] via SUBCUTANEOUS
  Administered 2017-01-13: 5 [IU] via SUBCUTANEOUS
  Administered 2017-01-13: 8 [IU] via SUBCUTANEOUS
  Administered 2017-01-13: 11 [IU] via SUBCUTANEOUS
  Administered 2017-01-14: 8 [IU] via SUBCUTANEOUS
  Administered 2017-01-14 (×2): 3 [IU] via SUBCUTANEOUS
  Administered 2017-01-15: 5 [IU] via SUBCUTANEOUS
  Filled 2017-01-10 (×6): qty 1

## 2017-01-10 MED ORDER — MAGNESIUM HYDROXIDE 400 MG/5ML PO SUSP: 30.0000 mL | Freq: Every day | ORAL | Status: DC | PRN

## 2017-01-10 NOTE — ED Notes (Addendum)
Son here visiting patient
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
## 2017-01-10 NOTE — ED Notes (Signed)
Patient ate all of breakfast
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
## 2017-01-10 NOTE — ED Notes (Signed)
Sister here visiting patient

## 2017-01-10 NOTE — ED Provider Notes (Signed)
-----------------------------------------   7:26 AM on 01/10/2017 -----------------------------------------   Blood pressure 131/79, pulse 78, temperature 98.1 F (36.7 C), temperature source Oral, resp. rate 18, SpO2 96 %.  The patient had no acute events since last update.  Calm and cooperative at this time.  The patient will be [facility] to [facility] BM[facility].     Rebecka ApleyWebster, Cordarro Spinnato P, MD 01/10/17 [phone]
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
## 2017-01-10 NOTE — ED Notes (Signed)
Brother in Social workerlaw here visiting patient

## 2017-01-10 NOTE — Plan of Care (Signed)
Problem: Activity: Goal: Imbalance in normal sleep/wake cycle will improve Outcome: Progressing Pt will have at least 5 or more hours of uninterrupted sleep.

## 2017-01-10 NOTE — ED Notes (Signed)
Patient made aware of his being transferred to lower level. Pt agreeable to move. Pt has been calm and cooperative - compliant with meds. Pt's mood is very sad, which is to be expected from the current situation. Pt does report passive SI, but denies A/V hallucinations. Pt remains safe with 15 minute checks
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
## 2017-01-10 NOTE — [facility] Group Notes (Signed)
[facility] LCSW Group Therapy  01/10/2017 2:11 PM  Type of Therapy:  Group Therapy  Participation Level:  Minimal  Participation Quality:  Attentive  Affect:  Flat  Cognitive:  Alert  Insight:  Limited  Engagement in Therapy:  Limited  Modes of Intervention:  Clarification, Discussion, Education, Limit-setting, Problem-solving, Reality Testing and Support  Summary of Progress/Problems: Stress management: Patients defined and discussed the topic of stress and the related symptoms and triggers for stress. Patients identified healthy coping skills they would like to try during hospitalization and after discharge to manage stress in a healthy way. CSW offered insight to varying stress management techniques.   Ceairra Mccarver G. Garnette CzechSampson MSW, LCSWA 01/10/2017, 2:11 PM

## 2017-01-10 NOTE — ED Notes (Signed)
Report given to Tiffany in lower level, BMU
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
## 2017-01-11 DIAGNOSIS — F333 Major depressive disorder, recurrent, severe with psychotic symptoms: Principal | ICD-10-CM

## 2017-01-11 LAB — LIPID PANEL
CHOL/HDL RATIO: 9 ratio
CHOLESTEROL: 243 mg/dL — AB (ref 0–200)
HDL: 27 mg/dL — AB (ref >40)
LDL Cholesterol: UNDETERMINED mg/dL (ref 0–99)
Triglycerides: 643 mg/dL — ABNORMAL HIGH (ref <150)
VLDL: UNDETERMINED mg/dL (ref 0–40)

## 2017-01-11 LAB — HEMOGLOBIN A1C
Hgb A1c MFr Bld: 7.5 % — ABNORMAL HIGH (ref 4.8–5.6)
Mean Plasma Glucose: 169 mg/dL

## 2017-01-11 LAB — GLUCOSE, CAPILLARY
Glucose-Capillary: 228 mg/dL — ABNORMAL HIGH (ref 65–99)
Glucose-Capillary: 307 mg/dL — ABNORMAL HIGH (ref 65–99)
Glucose-Capillary: 230 mg/dL — ABNORMAL HIGH (ref 65–99)
Glucose-Capillary: 213 mg/dL — ABNORMAL HIGH (ref 65–99)

## 2017-01-11 LAB — TSH: TSH: 3.879 u[IU]/mL (ref 0.350–4.500)

## 2017-01-11 MED ORDER — TRAZODONE HCL 50 MG PO TABS: 150.0000 mg | ORAL_TABLET | Freq: Every evening | ORAL | Status: DC | PRN

## 2017-01-11 MED ORDER — POLYETHYLENE GLYCOL 3350 17 G PO PACK
17.0000 g | PACK | Freq: Every day | ORAL | Status: DC
  Administered 2017-01-11 – 2017-01-15 (×5): 17 g via ORAL
  Filled 2017-01-11 (×5): qty 1

## 2017-01-11 MED ORDER — MIRTAZAPINE 15 MG PO TABS
7.5000 mg | ORAL_TABLET | Freq: Every day | ORAL | Status: DC
  Administered 2017-01-11: 7.5 mg via ORAL
  Filled 2017-01-11: qty 1

## 2017-01-11 MED ORDER — DULOXETINE HCL 30 MG PO CPEP
60.0000 mg | ORAL_CAPSULE | Freq: Every day | ORAL | Status: DC
  Administered 2017-01-11 – 2017-01-12 (×2): 60 mg via ORAL
  Filled 2017-01-11 (×2): qty 2

## 2017-01-11 NOTE — [facility] Suicide Risk Assessment (Signed)
[facility] INPATIENT:  Family/Significant Other Suicide Prevention Education  Suicide Prevention Education:  Education Completed; [name](wife [phone]), has been identified by the patient as the family member/significant other with whom the patient will be residing, and identified as the person(s) who will aid the patient in the event of a mental health crisis (suicidal ideations/suicide attempt).  With written consent from the patient, the family member/significant other has been provided the following suicide prevention education, prior to the and/or following the discharge of the patient.  The suicide prevention education provided includes the following:  Suicide risk factors  Suicide prevention and interventions  National Suicide Hotline telephone number  [facility] assessment telephone number  [facility] Emergency Assistance 911  [facility] and/or Residential Mobile Crisis Unit telephone number  Request made of family/significant other to:  Remove weapons (e.g., guns, rifles, knives), all items previously/currently identified as safety concern.    Remove drugs/medications (over-the-counter, prescriptions, illicit drugs), all items previously/currently identified as a safety concern.  The family member/significant other verbalizes understanding of the suicide prevention education information provided.  The family member/significant other agrees to remove the items of safety concern listed above.  Martin Riddle G. Garnette CzechSampson MSW, LCSWA 01/11/2017, 3:30 PM
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
## 2017-01-11 NOTE — Plan of Care (Signed)
Problem: Safety: Goal: Periods of time without injury will increase Outcome: Progressing Patient remains safe and without injury during hospitalization and on Q 15 minute observations. Will continue to monitor patient.   

## 2017-01-11 NOTE — Plan of Care (Signed)
Problem: Safety: Goal: Ability to disclose and discuss suicidal ideas will improve Outcome: Progressing Pt denies SI at this time   Problem: Coping: Goal: Ability to verbalize frustrations and anger appropriately will improve Outcome: Progressing Pt stated he was sad about his house/ dogs

## 2017-01-11 NOTE — Progress Notes (Signed)
D: Pt denies SI/HI/AVH. Pt is sad and depressed, pt stated he was thinking about his dogs and his house and has not been able to focus on anything else.   A: Pt was offered support and encouragement. Pt was given scheduled medications. Pt was encourage to attend groups. Q 15 minute checks were done for safety.   R:Pt attends groups and interacts well with peers and staff. Pt is taking medication. Pt receptive to treatment and safety maintained on unit.
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
## 2017-01-11 NOTE — [facility] Group Notes (Signed)
[facility] LCSW Group Therapy  01/11/2017 3:02 PM  Type of Therapy:  Group Therapy  Participation Level:  Patient did not attend group. CSW invited patient to group.   Summary of Progress/Problems:  Rhyse Loux G. Garnette CzechSampson MSW, LCSWA 01/11/2017, 3:02 PM

## 2017-01-12 LAB — PROLACTIN: PROLACTIN: 143.1 ng/mL — AB (ref 4.0–15.2)

## 2017-01-12 LAB — HEMOGLOBIN A1C
HEMOGLOBIN A1C: 7.4 % — AB (ref 4.8–5.6)
MEAN PLASMA GLUCOSE: 166 mg/dL

## 2017-01-12 LAB — GLUCOSE, CAPILLARY
GLUCOSE-CAPILLARY: 318 mg/dL — AB (ref 65–99)
Glucose-Capillary: 275 mg/dL — ABNORMAL HIGH (ref 65–99)
Glucose-Capillary: 313 mg/dL — ABNORMAL HIGH (ref 65–99)

## 2017-01-12 MED ORDER — INSULIN ASPART 100 UNIT/ML ~~LOC~~ SOLN
20.0000 [IU] | Freq: Three times a day (TID) | SUBCUTANEOUS | Status: DC
  Administered 2017-01-12 – 2017-01-15 (×8): 20 [IU] via SUBCUTANEOUS
  Filled 2017-01-12 (×5): qty 1

## 2017-01-12 MED ORDER — OXYCODONE HCL 5 MG PO TABS
30.0000 mg | ORAL_TABLET | ORAL | Status: DC | PRN
  Administered 2017-01-12 – 2017-01-15 (×10): 30 mg via ORAL
  Filled 2017-01-12 (×10): qty 6

## 2017-01-12 MED ORDER — INSULIN DETEMIR 100 UNIT/ML ~~LOC~~ SOLN
30.0000 [IU] | Freq: Two times a day (BID) | SUBCUTANEOUS | Status: DC
  Administered 2017-01-13 – 2017-01-14 (×3): 30 [IU] via SUBCUTANEOUS
  Filled 2017-01-12 (×5): qty 0.3

## 2017-01-12 MED ORDER — METOCLOPRAMIDE HCL 5 MG PO TABS
5.0000 mg | ORAL_TABLET | Freq: Three times a day (TID) | ORAL | Status: DC | PRN
  Filled 2017-01-12: qty 1

## 2017-01-12 MED ORDER — TRAZODONE HCL 50 MG PO TABS
150.0000 mg | ORAL_TABLET | Freq: Every day | ORAL | Status: DC
  Administered 2017-01-12 – 2017-01-14 (×3): 150 mg via ORAL
  Filled 2017-01-12 (×3): qty 1

## 2017-01-12 MED ORDER — DULOXETINE HCL 30 MG PO CPEP
90.0000 mg | ORAL_CAPSULE | Freq: Every day | ORAL | Status: DC
  Administered 2017-01-13 – 2017-01-15 (×3): 90 mg via ORAL
  Filled 2017-01-12 (×3): qty 3

## 2017-01-12 MED ORDER — CHLORDIAZEPOXIDE HCL 25 MG PO CAPS
50.0000 mg | ORAL_CAPSULE | Freq: Three times a day (TID) | ORAL | Status: DC
  Administered 2017-01-12 – 2017-01-14 (×5): 50 mg via ORAL
  Filled 2017-01-12 (×5): qty 2

## 2017-01-12 NOTE — Progress Notes (Signed)
Pt refused to get BS checked,vitalsd,  1700 medications, and ordered CT scan. Pt educated on importance of treatment compliance and importance of CT scan. Will continue to monitor

## 2017-01-12 NOTE — [facility] Group Notes (Signed)
[facility] LCSW Group Therapy Note  Date/Time: 01/12/17, 0930  Type of Therapy and Topic:  Group Therapy:  Overcoming Obstacles  Participation Level:  Did not attend  Description of Group:    In this group patients will be encouraged to explore what they see as obstacles to their own wellness and recovery. They will be guided to discuss their thoughts, feelings, and behaviors related to these obstacles. The group will process together ways to cope with barriers, with attention given to specific choices patients can make. Each patient will be challenged to identify changes they are motivated to make in order to overcome their obstacles. This group will be process-oriented, with patients participating in exploration of their own experiences as well as giving and receiving support and challenge from other group members.  Therapeutic Goals: 1. Patient will identify personal and current obstacles as they relate to admission. 2. Patient will identify barriers that currently interfere with their wellness or overcoming obstacles.  3. Patient will identify feelings, thought process and behaviors related to these barriers. 4. Patient will identify two changes they are willing to make to overcome these obstacles:    Summary of Patient Progress      Therapeutic Modalities:   Cognitive Behavioral Therapy Solution Focused Therapy Motivational Interviewing Relapse Prevention Therapy  Daleen SquibbGreg Argil Mahl, LCSW

## 2017-01-12 NOTE — Progress Notes (Signed)
CH spoke with patient. Patient did not wish to talk. Patient said that he needed nothing from anyone. CH will try to speak with patient tomorrow to see if he has changed his mind. Patient has experienced loss over the last week according to his story. Patient's voice sounded agitated/angry; however he was not rude.   01/12/17 1725  Clinical Encounter Type  Visited With Patient  Visit Type Initial;Spiritual support;Behavioral Health  Referral From Nurse

## 2017-01-12 NOTE — Progress Notes (Signed)
Pt agitated and irritable seen in bed demanding to go home. Pt stated, " I feel even worse since I came here. I am ready to go home now. I going to leave here and nobody can stop me". Pt complains that his medications are not correct and he is getting his pain medications late . Pt had recent fall RN educated pt on side effects of oxycodone may cause hypotension and sedation. Advised pt to drink plenty fluids. Will continue to monitor.

## 2017-01-12 NOTE — Progress Notes (Signed)
Inpatient Diabetes Program Recommendations  AACE/ADA: New Consensus Statement on Inpatient Glycemic Control (2015)  Target Ranges:  Prepandial:   less than 140 mg/dL      Peak postprandial:   less than 180 mg/dL (1-2 hours)      Critically ill patients:  140 - 180 mg/dL   Results for Martin Riddle, Martin Riddle (MRN [id number]) as of 01/12/2017 09:11  Ref. Range 01/11/2017 07:24 01/11/2017 11:25 01/11/2017 16:09 01/11/2017 21:13 01/12/2017 07:04  Glucose-Capillary Latest Ref Range: 65 - 99 mg/dL 409228 (H) 811307 (H) 914230 (H) 213 (H) 275 (H)   Review of Glycemic Control  Diabetes history: DM2 Outpatient Diabetes medications: Levemir 36 units BID, Novolog 10 units TID with meals, Glipizide XL 10 mg daily, Metformin 1000 mg BID Current orders for Inpatient glycemic control: Levemir 25 units BID, Novolog 0-15 units TID with meals, Novolog 18 units TID with meals, Glipizide XL 10 mg QAM, Metformin 1000 mg BID  Inpatient Diabetes Program Recommendations: Insulin - Basal: Please consider increasing Levemir to 30 units BID. Insulin - Meal Coverage: Please consider increasing meal coverage to Novolog 20 units TID with meals.  Thanks, Orlando PennerMarie Neela Zecca, RN, MSN, CDE Diabetes Coordinator Inpatient Diabetes Program [phone] (Team Pager from 8am to 5pm)
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
## 2017-01-12 NOTE — Plan of Care (Signed)
Problem: Safety: Goal: Ability to disclose and discuss suicidal ideas will improve Outcome: Progressing Pt reports having intermittent SI , commits to safety

## 2017-01-12 NOTE — Progress Notes (Signed)
Pt came up to RN reporting head hurting. Another pt walked up stated that he seen pt hit head on floor and seen pt shaking. Pt blood glucose level, and vitals taken. Pt head assessed pt had redness to left side of head. Pt LOC WNL. MD Ardyth HarpsHernandez  And charge nurse Jamesetta Sohyllis notified of fall.

## 2017-01-12 NOTE — Progress Notes (Signed)
Recreation Therapy Notes  Date: 08.06.18 Time: 1:00 pm Location: Craft Room  Group Topic: Wellness  Goal Area(s) Addresses:  Patient will identify at least one item per dimension of health. Patient will examine areas they are deficient in.  Behavioral Response: Did not attend  Intervention: 6 Dimensions of Health  Activity: Patients were given a definition sheet defining each dimension of health. Patients were given a worksheet with each dimension listed and were instructed to write items they were currently doing in each dimension.  Education: LRT educated patients on ways to improve each dimension.  Education Outcome: Patient did not attend group.  Clinical Observations/Feedback: Patient did not attend group.  Jacquelynn CreeGreene,Devarion Mcclanahan M, LRT/CTRS 01/12/2017 2:01 PM

## 2017-01-13 ENCOUNTER — Inpatient Hospital Stay: Payer: Medicare Other

## 2017-01-13 ENCOUNTER — Telehealth: Payer: Self-pay | Admitting: Family Medicine

## 2017-01-13 LAB — GLUCOSE, CAPILLARY
GLUCOSE-CAPILLARY: 330 mg/dL — AB (ref 65–99)
Glucose-Capillary: 285 mg/dL — ABNORMAL HIGH (ref 65–99)
Glucose-Capillary: 231 mg/dL — ABNORMAL HIGH (ref 65–99)
Glucose-Capillary: 219 mg/dL — ABNORMAL HIGH (ref 65–99)

## 2017-01-13 MED ORDER — LIDOCAINE 5 % EX PTCH
2.0000 | MEDICATED_PATCH | CUTANEOUS | Status: DC
  Administered 2017-01-13 – 2017-01-14 (×2): 2 via TRANSDERMAL
  Filled 2017-01-13 (×3): qty 2
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
## 2017-01-13 NOTE — Care Management Note (Signed)
Case Management Note  Patient Details  Name: Martin BonnetJames Calvin Kleinert MRN: [id number] Date of Birth: 04/03/1971  Subjective/Objective:                  RNCM spoke with patient's wife by phone to discuss follow up appointment as patient has not been seen by Dr. Shella Spearinghrismon for 5 years. Per wife patient's insurance is not covered at Dr. Charisse Marchhrismon's office and that he has to be seen by Dr. Tedd SiasSolum. Appointment made for 04/01/17 at 11:30AM however they Martin call patient with a closer appointment; they have hired another endocrinologist per staff. Per wife they Martin be staying in a hotel for a few weeks. She was able to fill all of patient's medications except Prilosec (because it was recently filled). I advised to obtain it over the counter but the cost is too high compared to billing insurance.   Action/Plan: My condolences to wife. RNCM gave my telephone number if any assistance is needed in the future. She denied needs at this time.   Expected Discharge Date:                  Expected Discharge Plan:     In-House Referral:     Discharge planning Services  CM Consult, Follow-up appt scheduled  Post Acute Care Choice:    Choice offered to:  Spouse  DME Arranged:    DME Agency:     HH Arranged:    HH Agency:     Status of Service:  Completed, signed off  If discussed at [employer] Length of Stay Meetings, dates discussed:    Additional Comments:  Collie Siadngela Kesa Birky, RN 01/13/2017, 8:28 AM

## 2017-01-13 NOTE — [facility] Group Notes (Signed)
[facility] Group Notes:  (Nursing/MHT/Case Management/Adjunct)  Date:  01/13/2017  Time:  7:14 AM  Type of Therapy:  Group Therapy  Participation Level:  Did Not Attend    Vonne Mcdanel W Kieffer Blatz 01/13/2017, 7:14 AM 

## 2017-01-13 NOTE — Tx Team (Signed)
Interdisciplinary Treatment and Diagnostic Plan Update **Late Entry   01/13/2017 Time of Session: 11:45 AM Martin BonnetJames Calvin Riddle MRN: [id number]  Principal Diagnosis: Severe recurrent major depression with psychotic features (HCC)  Secondary Diagnoses: Principal Problem:   Severe recurrent major depression with psychotic features (HCC) Active Problems:   DDD (degenerative disc disease), lumbar   Diabetes mellitus, type 2 (HCC)   Hypercholesteremia   Gastro-esophageal reflux disease without esophagitis   Current Medications:  Current Facility-Administered Medications  Medication Dose Route Frequency Provider Last Rate Last Dose  . acetaminophen (TYLENOL) tablet 650 mg  650 mg Oral Q6H PRN Clapacs, John T, MD      . alum & mag hydroxide-simeth (MAALOX/MYLANTA) 200-200-20 MG/5ML suspension 30 mL  30 mL Oral Q4H PRN Clapacs, John T, MD      . aspirin EC tablet 81 mg  81 mg Oral Daily Clapacs, Jackquline DenmarkJohn T, MD   81 mg at 01/13/17 0842  . chlordiazePOXIDE (LIBRIUM) capsule 50 mg  50 mg Oral TID Jimmy FootmanHernandez-Gonzalez, Andrea, MD   50 mg at 01/13/17 0842  . DULoxetine (CYMBALTA) DR capsule 90 mg  90 mg Oral Daily Jimmy FootmanHernandez-Gonzalez, Andrea, MD   90 mg at 01/13/17 0842  . gemfibrozil (LOPID) tablet 600 mg  600 mg Oral BID AC Clapacs, Jackquline DenmarkJohn T, MD   600 mg at 01/13/17 0843  . glipiZIDE (GLUCOTROL XL) 24 hr tablet 10 mg  10 mg Oral Q breakfast Clapacs, Jackquline DenmarkJohn T, MD   10 mg at 01/13/17 0843  . insulin aspart (novoLOG) injection 0-15 Units  0-15 Units Subcutaneous TID WC Clapacs, Jackquline DenmarkJohn T, MD   11 Units at 01/13/17 [phone]  . insulin aspart (novoLOG) injection 20 Units  20 Units Subcutaneous TID Elby ShowersAC Hernandez-Gonzalez, Andrea, MD   20 Units at 01/13/17 0840  . insulin detemir (LEVEMIR) injection 30 Units  30 Units Subcutaneous BID Jimmy FootmanHernandez-Gonzalez, Andrea, MD   30 Units at 01/13/17 [id number]  . lisinopril (PRINIVIL,ZESTRIL) tablet 40 mg  40 mg Oral Daily Clapacs, Jackquline DenmarkJohn T, MD   40 mg at 01/12/17 0743  . magnesium hydroxide  (MILK OF MAGNESIA) suspension 30 mL  30 mL Oral Daily PRN Clapacs, John T, MD      . metFORMIN (GLUCOPHAGE) tablet 1,000 mg  1,000 mg Oral BID WC Clapacs, Jackquline DenmarkJohn T, MD   1,000 mg at 01/13/17 0843  . metoCLOPramide (REGLAN) tablet 5 mg  5 mg Oral Q8H PRN Jimmy FootmanHernandez-Gonzalez, Andrea, MD      . oxyCODONE (Oxy IR/ROXICODONE) immediate release tablet 30 mg  30 mg Oral Q4H PRN Jimmy FootmanHernandez-Gonzalez, Andrea, MD   30 mg at 01/13/17 0609  . polyethylene glycol (MIRALAX / GLYCOLAX) packet 17 g  17 g Oral Daily Beverly SessionsSubedi, Jagannath, MD   17 g at 01/13/17 0843  . promethazine (PHENERGAN) tablet 12.5 mg  12.5 mg Oral Q6H PRN Clapacs, Jackquline DenmarkJohn T, MD   12.5 mg at 01/13/17 0137  . traZODone (DESYREL) tablet 150 mg  150 mg Oral QHS Jimmy FootmanHernandez-Gonzalez, Andrea, MD   150 mg at 01/12/17 2200   PTA Medications: Prescriptions Prior to Admission  Medication Sig Dispense Refill Last Dose  . acetaminophen (TYLENOL) 325 MG tablet Take 325 mg by mouth every 6 (six) hours as needed for mild pain or moderate pain.    Past Week at Unknown time  . aspirin EC 81 MG EC tablet Take 1 tablet (81 mg total) by mouth daily. 30 tablet 0   . bacitracin ointment Apply 1 application topically 2 (two) times daily. 120 g  0   . gemfibrozil (LOPID) 600 MG tablet Take 1 tablet (600 mg total) by mouth 2 (two) times daily. 60 tablet 2   . glipiZIDE (GLUCOTROL XL) 10 MG 24 hr tablet Take 10 mg by mouth once a day.  10 12/08/2015 at Unknown time  . insulin aspart (NOVOLOG) 100 UNIT/ML injection Inject 10 Units into the skin 3 (three) times daily with meals. 10 mL 11   . LEVEMIR 100 UNIT/ML injection INJECT 28 UNITS SQ BID (Patient taking differently: Inject 36 Units into the skin 2 (two) times daily. ) 10 mL 0 11/25/2015 at Unknown time  . lisinopril (PRINIVIL,ZESTRIL) 40 MG tablet Take 40 mg by mouth once a day  5 12/08/2015 at Unknown time  . LORazepam (ATIVAN) 2 MG tablet Take 2 mg by mouth at bedtime.  0 12/08/2015 at Unknown time  . metFORMIN (GLUCOPHAGE) 1000  MG tablet Take 1 tablet (1,000 mg total) by mouth 2 (two) times daily with a meal. 60 tablet 1   . metoCLOPramide (REGLAN) 10 MG tablet Take 10 mg by mouth 2 (two) times daily. *Take with evening meal at supper and at bedtime.*  5 12/08/2015 at Unknown time  . omeprazole (PRILOSEC) 20 MG capsule Take 20 mg by mouth 2 (two) times daily.  12 12/08/2015 at Unknown time  . oxycodone (ROXICODONE) 30 MG immediate release tablet TK 1 T PO Q 3 H  0 12/08/2015 at Unknown time  . polyethylene glycol powder (GLYCOLAX/MIRALAX) powder MIX 17 GRAMS WITH 8 OUNCES OF FLUID AND DRINK ONCE DAILY  0 12/08/2015 at Unknown time  . promethazine (PHENERGAN) 25 MG tablet Take 25 mg by mouth every 6 hours as needed for nausea/and or vomiting.  1 Past Week at Unknown time    Patient Stressors: Loss of dogs that he considered family. Traumatic event  Patient Strengths: Capable of independent living Communication skills Supportive family/friends  Treatment Modalities: Medication Management, Group therapy, Case management,  1 to 1 session with clinician, Psychoeducation, Recreational therapy.   Physician Treatment Plan for Primary Diagnosis: Severe recurrent major depression with psychotic features (HCC) Long Term Goal(s): Improvement in symptoms so as ready for discharge Improvement in symptoms so as ready for discharge   Short Term Goals: Ability to identify changes in lifestyle to reduce recurrence of condition Martin improve Ability to verbalize feelings Martin improve Ability to disclose and discuss suicidal ideas Ability to demonstrate self-control Martin improve Ability to identify and develop effective coping behaviors Martin improve Ability to maintain clinical measurements within normal limits Martin improve Compliance with prescribed medications Martin improve Ability to identify triggers associated with substance abuse/mental health issues Martin improve Ability to identify changes in lifestyle to reduce recurrence of  condition Martin improve Ability to verbalize feelings Martin improve Ability to disclose and discuss suicidal ideas Ability to demonstrate self-control Martin improve Ability to identify and develop effective coping behaviors Martin improve Ability to maintain clinical measurements within normal limits Martin improve Compliance with prescribed medications Martin improve Ability to identify triggers associated with substance abuse/mental health issues Martin improve  Medication Management: Evaluate patient's response, side effects, and tolerance of medication regimen.  Therapeutic Interventions: 1 to 1 sessions, Unit Group sessions and Medication administration.  Evaluation of Outcomes: Progressing  Physician Treatment Plan for Secondary Diagnosis: Principal Problem:   Severe recurrent major depression with psychotic features (HCC) Active Problems:   DDD (degenerative disc disease), lumbar   Diabetes mellitus, type 2 (HCC)   Hypercholesteremia   Gastro-esophageal reflux disease  without esophagitis  Long Term Goal(s): Improvement in symptoms so as ready for discharge Improvement in symptoms so as ready for discharge   Short Term Goals: Ability to identify changes in lifestyle to reduce recurrence of condition Martin improve Ability to verbalize feelings Martin improve Ability to disclose and discuss suicidal ideas Ability to demonstrate self-control Martin improve Ability to identify and develop effective coping behaviors Martin improve Ability to maintain clinical measurements within normal limits Martin improve Compliance with prescribed medications Martin improve Ability to identify triggers associated with substance abuse/mental health issues Martin improve Ability to identify changes in lifestyle to reduce recurrence of condition Martin improve Ability to verbalize feelings Martin improve Ability to disclose and discuss suicidal ideas Ability to demonstrate self-control Martin improve Ability to identify and  develop effective coping behaviors Martin improve Ability to maintain clinical measurements within normal limits Martin improve Compliance with prescribed medications Martin improve Ability to identify triggers associated with substance abuse/mental health issues Martin improve     Medication Management: Evaluate patient's response, side effects, and tolerance of medication regimen.  Therapeutic Interventions: 1 to 1 sessions, Unit Group sessions and Medication administration.  Evaluation of Outcomes: Progressing   RN Treatment Plan for Primary Diagnosis: Severe recurrent major depression with psychotic features (HCC) Long Term Goal(s): Knowledge of disease and therapeutic regimen to maintain health Martin improve  Short Term Goals: Ability to remain free from injury Martin improve, Ability to demonstrate self-control, Ability to disclose and discuss suicidal ideas and Compliance with prescribed medications Martin improve  Medication Management: RN Martin administer medications as ordered by provider, Martin assess and evaluate patient's response and provide education to patient for prescribed medication. RN Martin report any adverse and/or side effects to prescribing provider.  Therapeutic Interventions: 1 on 1 counseling sessions, Psychoeducation, Medication administration, Evaluate responses to treatment, Monitor vital signs and CBGs as ordered, Perform/monitor CIWA, COWS, AIMS and Fall Risk screenings as ordered, Perform wound care treatments as ordered.  Evaluation of Outcomes: Progressing   LCSW Treatment Plan for Primary Diagnosis: Severe recurrent major depression with psychotic features (HCC) Long Term Goal(s): Safe transition to appropriate next level of care at discharge, Engage patient in therapeutic group addressing interpersonal concerns.  Short Term Goals: Engage patient in aftercare planning with referrals and resources, Increase social support, Increase emotional regulation and Increase  skills for wellness and recovery  Therapeutic Interventions: Assess for all discharge needs, 1 to 1 time with Social worker, Explore available resources and support systems, Assess for adequacy in community support network, Educate family and significant other(s) on suicide prevention, Complete Psychosocial Assessment, Interpersonal group therapy.  Evaluation of Outcomes: Progressing   Progress in Treatment: Attending groups: No. Participating in groups: No. Taking medication as prescribed: Yes. Toleration medication: Yes. Family/Significant other contact made: Yes, individual(s) contacted:  CSW spoke with patient's wife. Patient understands diagnosis: Yes. Discussing patient identified problems/goals with staff: Yes. Medical problems stabilized or resolved: Yes. Denies suicidal/homicidal ideation: Yes. Issues/concerns per patient self-inventory: No.  New problem(s) identified: No, Describe:  None  New Short Term/Long Term Goal(s): Patient stated that his goal is to geel better overall to be able to help his family.  Discharge Plan or Barriers: CSW assessing appropriate plan.  Reason for Continuation of Hospitalization: Depression Suicidal ideation Withdrawal symptoms  Estimated Length of Stay: 3 days   Attendees: Patient: Martin Riddle 01/13/2017 8:45 AM  Physician: Dr. Radene Journey, MD  01/13/2017 8:45 AM  Nursing:  01/13/2017 8:45 AM  RN Care Manager: 01/13/2017 8:45  AM  Social Worker: Hampton Abbot, MSW, LCSW-A 01/13/2017 8:45 AM  Recreational Therapist: Princella Ion, LRT, CTRS  01/13/2017 8:45 AM  Other:  01/13/2017 8:45 AM  Other:  01/13/2017 8:45 AM  Other: 01/13/2017 8:45 AM    Scribe for Treatment Team: Lynden Oxford, LCSWA 01/13/2017 8:45 AM

## 2017-01-13 NOTE — [facility] Group Notes (Addendum)
[facility] LCSW Group Therapy Note  Date/Time:01/13/2017, 9:30am  Type of Therapy/Topic:  Group Therapy:  Feelings about Diagnosis  Participation Level:  Did Not Attend    Glennon MacSara P Reannon Candella, LCSW 01/13/2017, 3:37 PM

## 2017-01-13 NOTE — Telephone Encounter (Signed)
Collie SiadAngela Johnson Nurse [facility] hospital f/[facility]. [facility] Marylene Landngela stated that it hasn't been determined when pt will be discharged. LOV: 03/03/13. Maurine MinisterDennis ok re-establish. Appt is scheduled for Tuesday 01/20/17 @ 2 pm. Thanks TNP

## 2017-01-13 NOTE — Telephone Encounter (Signed)
FYI I think-aa

## 2017-01-13 NOTE — Plan of Care (Signed)
Problem: Activity: Goal: Interest or engagement in leisure activities will improve Outcome: Not Progressing Remained in room no group  Programing  Goal: Imbalance in normal sleep/wake cycle will improve Stated he is sleeping fair   Problem: Education: Goal: Utilization of techniques to improve thought processes will improve Outcome: Not Progressing Not attending  Unit programing  Goal: Knowledge of the prescribed therapeutic regimen will improve Outcome: Not Progressing Not attending  programing  Problem: Coping: Goal: Ability to cope will improve Outcome: Progressing Planning out  What  To do on return home  Goal: Ability to verbalize feelings will improve Outcome: Progressing Patient table to verbalize feeling   Problem: Health Behavior/Discharge Planning: Goal: Ability to make decisions will improve Outcome: Progressing Planning out event  For return home                                                                                                                                   On  Goal: Compliance with therapeutic regimen will improve Outcome: Not Progressing Not compliant with therapy  Problem: Role Relationship: Goal: Ability to demonstrate positive changes in social behaviors and relationships will improve Outcome: Not Progressing Behavior can change  During conversation   Problem: Safety: Goal: Ability to disclose and discuss suicidal ideas will improve Outcome: Progressing Able to state he is not suicidal

## 2017-01-13 NOTE — Progress Notes (Addendum)
Inpatient Diabetes Program Recommendations  AACE/ADA: New Consensus Statement on Inpatient Glycemic Control (2015)  Target Ranges:  Prepandial:   less than 140 mg/dL      Peak postprandial:   less than 180 mg/dL (1-2 hours)      Critically ill patients:  140 - 180 mg/dL   Results for Martin BonnetLLEN, Gerren CALVIN (MRN [id number]) as of 01/13/2017 07:38  Ref. Range 01/12/2017 07:04 01/12/2017 11:28 01/12/2017 12:31 01/13/2017 07:13  Glucose-Capillary Latest Ref Range: 65 - 99 mg/dL 621275 (H) 308313 (H) 657318 (H) 330 (H)   Review of Glycemic Control  Diabetes history: DM2 Outpatient Diabetes medications:  Levemir 36 units BID, Novolog 10 units TID with meals, Glipizide XL 10 mg daily, Metformin 1000 mg BID Current orders for Inpatient glycemic control: Levemir 30 units BID, Novolog 0-15 units TID with meals, Novolog 20 units TID with meals, Glipizide XL 10 mg QAM, Metformin 1000 mg BID  Inpatient Diabetes Program Recommendations: Insulin - Basal: In reviewing chart, noted patient refused evening dose of Levemir on 01/12/17. Therefore, patient only received one dose of Levemir 25 units on 01/12/17. Fasting glucose 330 mg/dl this morning as a result of not getting Levemir dose last night. Do not recommend changing Levemir dose at this time. Please talk with patient and encourage patient to take insulin as prescribed.  Insulin - Meal Coverage: Note patient refused evening dose of meal coverage with supper last night.  NOTE: Nursing, please encourage patient to take insulin as currently prescribed.  Thanks, Orlando PennerMarie Axelle Szwed, RN, MSN, CDE Diabetes Coordinator Inpatient Diabetes Program [phone] (Team Pager from 8am to 5pm)

## 2017-01-13 NOTE — [facility] Group Notes (Signed)
[facility] Group Notes:  (Nursing/MHT/Case Management/Adjunct)  Date:  01/13/2017  Time:  3:06 PM  Type of Therapy:  Psychoeducational Skills  Participation Level:  Did Not Attend  Martin Riddle [facility] 01/13/2017, 3:06 PM

## 2017-01-14 LAB — GLUCOSE, CAPILLARY
GLUCOSE-CAPILLARY: 282 mg/dL — AB (ref 65–99)
GLUCOSE-CAPILLARY: 160 mg/dL — AB (ref 65–99)
Glucose-Capillary: 194 mg/dL — ABNORMAL HIGH (ref 65–99)

## 2017-01-14 MED ORDER — GLIPIZIDE ER 10 MG PO TB24: 10.0000 mg | ORAL_TABLET | Freq: Every day | ORAL | 0 refills | Status: DC

## 2017-01-14 MED ORDER — INSULIN ASPART 100 UNIT/ML ~~LOC~~ SOLN: 20.0000 [IU] | Freq: Three times a day (TID) | SUBCUTANEOUS | 0 refills | Status: DC

## 2017-01-14 MED ORDER — GEMFIBROZIL 600 MG PO TABS: 600.0000 mg | ORAL_TABLET | Freq: Two times a day (BID) | ORAL | 0 refills | Status: DC

## 2017-01-14 MED ORDER — METFORMIN HCL 1000 MG PO TABS: 1000.0000 mg | ORAL_TABLET | Freq: Two times a day (BID) | ORAL | 0 refills | Status: DC

## 2017-01-14 MED ORDER — CHLORDIAZEPOXIDE HCL 25 MG PO CAPS: 25.0000 mg | ORAL_CAPSULE | Freq: Three times a day (TID) | ORAL | Status: DC

## 2017-01-14 MED ORDER — INSULIN DETEMIR 100 UNIT/ML ~~LOC~~ SOLN
35.0000 [IU] | Freq: Two times a day (BID) | SUBCUTANEOUS | Status: DC
  Administered 2017-01-14 – 2017-01-15 (×2): 35 [IU] via SUBCUTANEOUS
  Filled 2017-01-14 (×3): qty 0.35

## 2017-01-14 MED ORDER — OXYCODONE HCL 30 MG PO TABS: 30.0000 mg | ORAL_TABLET | ORAL | 0 refills | Status: DC | PRN

## 2017-01-14 MED ORDER — TRAZODONE HCL 150 MG PO TABS: 150.0000 mg | ORAL_TABLET | Freq: Every day | ORAL | 0 refills | Status: DC

## 2017-01-14 MED ORDER — CHLORDIAZEPOXIDE HCL 25 MG PO CAPS
25.0000 mg | ORAL_CAPSULE | Freq: Three times a day (TID) | ORAL | Status: DC
  Administered 2017-01-14 – 2017-01-15 (×2): 25 mg via ORAL
  Filled 2017-01-14 (×2): qty 1

## 2017-01-14 MED ORDER — POLYETHYLENE GLYCOL 3350 17 G PO PACK: 17.0000 g | PACK | Freq: Every day | ORAL | 0 refills | Status: AC

## 2017-01-14 MED ORDER — DULOXETINE HCL 30 MG PO CPEP: 90.0000 mg | ORAL_CAPSULE | Freq: Every day | ORAL | 0 refills | Status: DC

## 2017-01-14 MED ORDER — INSULIN DETEMIR 100 UNIT/ML ~~LOC~~ SOLN
35.0000 [IU] | Freq: Two times a day (BID) | SUBCUTANEOUS | 0 refills | Status: DC
Start: 2017-01-14 — End: 2018-02-28

## 2017-01-14 MED ORDER — KETOROLAC TROMETHAMINE 10 MG PO TABS
10.0000 mg | ORAL_TABLET | Freq: Three times a day (TID) | ORAL | Status: DC | PRN
  Filled 2017-01-14 (×2): qty 1

## 2017-01-14 MED ORDER — CHLORDIAZEPOXIDE HCL 25 MG PO CAPS: 25.0000 mg | ORAL_CAPSULE | Freq: Three times a day (TID) | ORAL | 0 refills | Status: AC

## 2017-01-14 NOTE — Plan of Care (Signed)
Problem: Activity: Goal: Interest or engagement in leisure activities will improve Outcome: Not Progressing Patient does not engage in leisure activities. Goal: Imbalance in normal sleep/wake cycle will improve Outcome: Not Progressing Patient continues to sleep during the day, does not participate in activities.   Problem: Education: Goal: Knowledge of the prescribed therapeutic regimen will improve Outcome: Progressing Patient made aware of new medication prescribed for pain, patient verbalized understanding the use of the med.  Problem: Coping: Goal: Ability to verbalize feelings will improve Outcome: Progressing Patient able to verbalize needs to staff.

## 2017-01-14 NOTE — Progress Notes (Signed)
Patient ID: Martin BonnetJames Calvin Hur, male   DOB: 10/29/1970, 46 y.o.   MRN: [id number] Visitation by family went well according to patient, ambulates with a rolling walker, "I fell during the day yesterday, I felt dizzy and passed out .Marland Kitchen..." A&Ox3, c/o 7/10 throbbing, stabbing, crushing (Chronic) back pain, received Oxycodone IR 30 mg @ 2127; patient is watching clock. Denied SI/HI/AVH.
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
## 2017-01-14 NOTE — Progress Notes (Signed)
Patient is alert and oriented to person, place and time. Skin is warm, dry and intact.  Patient currently denies SI at this time. Patient was observed ambulating slowly with walker in hall during the shift. Frequent complaints of pain, medicated with prn medications. In room majority of shift, lying in bed with eyes closed.  Attends meals and group with peer interaction noted. Milieu remains therapeutic. Patient will be monitored and physician notified of any acute changes.

## 2017-01-14 NOTE — [facility] Suicide Risk Assessment (Signed)
[facility]   Principal Problem: Severe recurrent major depression with psychotic features [facility](HCC) Discharge Diagnoses:  Patient Active Problem List   Diagnosis Date Noted  . Severe recurrent major depression with psychotic features (HCC) [F33.3] 01/09/2017  . 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 DDD (degenerative disc disease), lumbar [M51.36] 01/16/2015  . Diabetes mellitus, type 2 (HCC) [E11.9] 01/16/2015  . Hypercholesteremia [E78.00] 01/16/2015  . Gastro-esophageal reflux disease without esophagitis [K21.9] 01/16/2015  . Secondary peripheral neuropathy [G60.9] 01/16/2015      Psychiatric Specialty Exam: ROS  Blood pressure 104/64, pulse (!) 113, temperature 97.7 F (36.5 C), resp. rate 18, height 6\' 4"  (1.93 m), weight 124.3 kg (274 lb), SpO2 98 %.Body mass index is 33.35 kg/m.                                                       Mental Status Per Nursing Assessment::   On Admission:  NA  Demographic Factors:  Male and Caucasian  Loss Factors: Decrease in vocational status and Decline in physical health  Historical Factors: Impulsivity  Risk Reduction Factors:   Sense of responsibility to family, Religious beliefs about death, Living with another person, especially a relative and Positive social support  Continued Clinical Symptoms:  Depression:   Impulsivity Chronic Pain  Cognitive Features That Contribute To Risk:  Closed-mindedness    Suicide Risk:  Minimal: No identifiable suicidal ideation.  Patients presenting with no risk factors but with morbid ruminations; may be classified as minimal risk based on the severity of the depressive symptoms  Follow-up Information    Solum, Marlana SalvageAnna M, MD. Go on 04/01/2017.   Specialty:  Endocrinology Why:  at 11:30AM Contact information: [address] [facility] [place] [address] [phone]        Trey Sailorsoy, Mark, MD. Go on 01/15/2017.    Specialty:  Neurosurgery Contact information: [address] [place] [address] [phone]            Jimmy FootmanHernandez-Gonzalez,  Goro Wenrick, MD 01/14/2017, 9:57 AM

## 2017-01-14 NOTE — Progress Notes (Signed)
Patient is settled and is not wanting anything but to sleep. CH spoke with him for a view moments and patient fell asleep. CH prayed silently for patient and then left.   01/14/17 1311  Clinical Encounter Type  Visited With Patient  Visit Type Follow-up;Spiritual support;Behavioral Health  Referral From Chaplain

## 2017-01-14 NOTE — Plan of Care (Signed)
Problem: Activity: Goal: Sleeping patterns will improve Outcome: Progressing Patient slept for Estimated Hours of 7; Precautionary checks every 15 minutes for safety maintained, room free of safety hazards, patient sustains no injury or falls during this shift. Received Oxy IR 30 mg again @ 0646 --- neck and back chronic pain.
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
## 2017-01-14 NOTE — Progress Notes (Signed)
Recreation Therapy Notes  Date: 08.08.18 Time: 1:00 pm Location: Craft Room  Group Topic: Self-esteem  Goal Area(s) Addresses:  Patient will write at least one positive trait about self. Patient will verbalize benefit of having a healthy self-esteem.  Behavioral Response: Did not attend  Intervention: I Am  Activity: Patients were given a worksheet with the letter I on it and were instructed to write as many positive traits about themselves inside the letter.  Education: LRT educated patients on ways they can increase their self-esteem.  Education Outcome: Patient did not attend group.  Clinical Observations/Feedback: Patient did not attend group.  Karren Newland M, LRT/CTRS 01/14/2017 1:39 PM 

## 2017-01-14 NOTE — Progress Notes (Signed)
Inpatient Diabetes Program Recommendations  AACE/ADA: New Consensus Statement on Inpatient Glycemic Control (2015)  Target Ranges:  Prepandial:   less than 140 mg/dL      Peak postprandial:   less than 180 mg/dL (1-2 hours)      Critically ill patients:  140 - 180 mg/dL   Results for Martin Riddle, Martin Riddle (MRN [id number]) as of 01/14/2017 09:29  Ref. Range 01/13/2017 07:13 01/13/2017 11:55 01/13/2017 16:40 01/13/2017 21:03 01/14/2017 07:08  Glucose-Capillary Latest Ref Range: 65 - 99 mg/dL 409330 (H) 811285 (H) 914231 (H) 219 (H) 282 (H)   Review of Glycemic Control  Diabetes history: DM2 Outpatient Diabetes medications: Levemir 36 units BID, Novolog 10 units TID with meals, Glipizide XL 10 mg daily, Metformin 1000 mg BID Current orders for Inpatient glycemic control:Levemir 30 units BID, Novolog 0-15 units TID with meals, Novolog 20 units TID with meals, Glipizide XL 10 mg QAM, Metformin 1000 mg BID  Inpatient Diabetes Program Recommendations: Insulin - Basal: In reviewing chart, patient took insulin as currently prescribed on 01/13/17. Fasting glucose 282 mg/dl this morning. Please consider increasing Levemir to 35 units BID.  NOTE: Nursing, please continue to encourage patient to take insulin as currently prescribed.  Thanks, Orlando PennerMarie Amberia Bayless, RN, MSN, CDE Diabetes Coordinator Inpatient Diabetes Program [phone] (Team Pager from 8am to 5pm)

## 2017-01-14 NOTE — [facility] Group Notes (Signed)
  [facility] LCSW Group Therapy Note  Date/Time: 01/14/17, 0930  Type of Therapy/Topic:  Group Therapy:  Emotion Regulation  Participation Level:  Did Not Attend   Mood:  Description of Group:    The purpose of this group is to assist patients in learning to regulate negative emotions and experience positive emotions. Patients will be guided to discuss ways in which they have been vulnerable to their negative emotions. These vulnerabilities will be juxtaposed with experiences of positive emotions or situations, and patients challenged to use positive emotions to combat negative ones. Special emphasis will be placed on coping with negative emotions in conflict situations, and patients will process healthy conflict resolution skills.  Therapeutic Goals: 1. Patient will identify two positive emotions or experiences to reflect on in order to balance out negative emotions:  2. Patient will label two or more emotions that they find the most difficult to experience:  3. Patient will be able to demonstrate positive conflict resolution skills through discussion or role plays:   Summary of Patient Progress:       Therapeutic Modalities:   Cognitive Behavioral Therapy Feelings Identification Dialectical Behavioral Therapy  Greg Damaya Channing, LCSW 

## 2017-01-15 LAB — GLUCOSE, CAPILLARY: Glucose-Capillary: 208 mg/dL — ABNORMAL HIGH (ref 65–99)

## 2017-01-15 MED ORDER — METHOCARBAMOL 750 MG PO TABS: 750.0000 mg | ORAL_TABLET | Freq: Four times a day (QID) | ORAL | Status: DC | PRN

## 2017-01-15 MED ORDER — IBUPROFEN 200 MG PO TABS: 200.0000 mg | ORAL_TABLET | Freq: Three times a day (TID) | ORAL | Status: DC

## 2017-01-15 MED ORDER — ACETAMINOPHEN 325 MG PO TABS: 650.0000 mg | ORAL_TABLET | Freq: Three times a day (TID) | ORAL | Status: DC

## 2017-01-15 MED ORDER — METHOCARBAMOL 750 MG PO TABS: 750.0000 mg | ORAL_TABLET | Freq: Four times a day (QID) | ORAL | 0 refills | Status: DC | PRN

## 2017-01-15 MED ORDER — OXYCODONE HCL 5 MG PO TABS: 20.0000 mg | ORAL_TABLET | Freq: Three times a day (TID) | ORAL | Status: DC

## 2017-01-15 MED ORDER — OXYCODONE HCL 20 MG PO TABS: 20.0000 mg | ORAL_TABLET | Freq: Three times a day (TID) | ORAL | 0 refills | Status: DC

## 2017-01-15 NOTE — Plan of Care (Signed)
Problem: Activity: Goal: Interest or engagement in leisure activities will improve Outcome: Progressing Stayed in the dayroom and involved in activities

## 2017-01-15 NOTE — Care Management (Signed)
RNCM spoke with wife and she is prepared to take patient home today/hotel.  She states that Dr. Pricilla HandlerSolum's office contacted her yesterday with an appointment this Friday 01/18/17 which has been updated on discharge plan. No further RNCM needs.

## 2017-01-15 NOTE — Plan of Care (Signed)
Problem: Safety: Goal: Periods of time without injury will increase Outcome: Progressing Remains free from injury. Safety precautions maintained.

## 2017-01-15 NOTE — Plan of Care (Signed)
Problem: Activity: Goal: Interest or engagement in leisure activities will improve Outcome: Progressing Was seen in the dayroom interacting with staff and peers appropriately

## 2017-01-15 NOTE — Progress Notes (Signed)
  [facility] :  Will you be returning to the same living situation after discharge:  No. At discharge, do you have transportation home?: Yes,  wife will pick pt up. Do you have the ability to pay for your medications: Yes,  [facility] Medicare  Release of information consent forms completed and in the chart;  Patient's signature needed at discharge.  Patient to Follow up at: Follow-up Information    Solum, Marlana SalvageAnna M, MD. Go on 04/01/2017.   Specialty:  Endocrinology Why:  at 11:30AM Contact information: [address] [facility] [place] [address] [phone]        Trey Sailorsoy, Mark, MD. Go on 01/15/2017.   Specialty:  Neurosurgery Contact information: [address] [place] [address] [phone]        [facility]. Go on 01/27/2017.   Why:  Please follow up with Leone [facility]orrystal Montague [facility] Tuesday, 01/27/17, at 12:45pm.  Please bring insurance card, photo ID, and discharge summary.  Questions or concerns, contact the office directly. Contact information: [address] [place] [address] [phone]           Next level of care provider [facility] Link:no  Safety Planning and Suicide Prevention discussed: Yes,  SPE completed with patient and pt's wife     Has patient been referred to the Quitline?: N/A patient is not a smoker  Patient has been referred for addiction treatment: Pt. refused referral  Lynden OxfordKadijah R Naoki Migliaccio, LCSWA 01/15/2017, 8:53 AM

## 2017-01-15 NOTE — Plan of Care (Signed)
Problem: Education: Goal: Knowledge of the prescribed therapeutic regimen will improve Outcome: Progressing Understands medication regime and remains compliant

## 2017-01-15 NOTE — [facility] Group Notes (Signed)
[facility] LCSW Group Therapy Note  Date/Time: 01/15/17 0930  Type of Therapy/Topic:  Group Therapy:  Balance in Life  Participation Level:  Did not attend  Description of Group:    This group will address the concept of balance and how it feels and looks when one is unbalanced. Patients will be encouraged to process areas in their lives that are out of balance, and identify reasons for remaining unbalanced. Facilitators will guide patients utilizing problem- solving interventions to address and correct the stressor making their life unbalanced. Understanding and applying boundaries will be explored and addressed for obtaining  and maintaining a balanced life. Patients will be encouraged to explore ways to assertively make their unbalanced needs known to significant others in their lives, using other group members and facilitator for support and feedback.  Therapeutic Goals: 1. Patient will identify two or more emotions or situations they have that consume much of in their lives. 2. Patient will identify signs/triggers that life has become out of balance:  3. Patient will identify two ways to set boundaries in order to achieve balance in their lives:  4. Patient will demonstrate ability to communicate their needs through discussion and/or role plays  Summary of Patient Progress:          Therapeutic Modalities:   Cognitive Behavioral Therapy Solution-Focused Therapy Assertiveness Training  Daleen SquibbGreg Amarachi Kotz, LCSW
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
## 2017-01-15 NOTE — Plan of Care (Signed)
Problem: Coping: Goal: Ability to verbalize feelings will improve Outcome: Progressing Was able to verbalize his feelings. Discussed about home situation and was able to utilize his coping skills

## 2017-01-15 NOTE — Progress Notes (Signed)
Patient reported to this writer that he does not feel ready to be discharged. Stated that he has been hearing a voice telling him that "I you were with the dogs, you could have died too". Patient reports that he has "mixed emotions". Stated that "I kind of want to go but l also I haven't been feeling well. I don't know what is wrong with me". Patient complained of pain again this morning and received Oxycodon 30mg  as prescribed. Currently in room, restless., anxious and ambivalent about leaving. This was reported to the incoming nurse for follow up.

## 2017-01-15 NOTE — Plan of Care (Signed)
Problem: Coping: Goal: Ability to cope will improve Outcome: Progressing Maintained appropriate behavior, calm and cooperative
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
## 2017-01-15 NOTE — Plan of Care (Signed)
Problem: Self-Concept: Goal: Ability to disclose and discuss suicidal ideas will improve Outcome: Progressing Expressed worries related to home situation. Denied SI/HI

## 2017-01-20 ENCOUNTER — Inpatient Hospital Stay: Payer: Self-pay | Admitting: Family Medicine

## 2017-10-27 ENCOUNTER — Other Ambulatory Visit: Payer: Self-pay | Admitting: Internal Medicine

## 2017-10-27 DIAGNOSIS — R2241 Localized swelling, mass and lump, right lower limb: Secondary | ICD-10-CM

## 2017-10-28 ENCOUNTER — Ambulatory Visit: Payer: Medicare Other

## 2017-12-30 ENCOUNTER — Other Ambulatory Visit: Payer: Self-pay | Admitting: Internal Medicine

## 2017-12-30 DIAGNOSIS — E1165 Type 2 diabetes mellitus with hyperglycemia: Secondary | ICD-10-CM

## 2017-12-30 DIAGNOSIS — M7989 Other specified soft tissue disorders: Secondary | ICD-10-CM

## 2018-01-05 ENCOUNTER — Ambulatory Visit
Admission: RE | Admit: 2018-01-05 | Discharge: 2018-01-05 | Disposition: A | Discharge status: Home / Self Care | Payer: Medicare Other | Source: Ambulatory Visit | Attending: Internal Medicine | Admitting: Internal Medicine

## 2018-02-04 ENCOUNTER — Other Ambulatory Visit: Payer: Self-pay | Admitting: Neurosurgery

## 2018-02-04 DIAGNOSIS — M47812 Spondylosis without myelopathy or radiculopathy, cervical region: Secondary | ICD-10-CM

## 2018-02-10 ENCOUNTER — Telehealth: Payer: Self-pay | Admitting: Nurse Practitioner

## 2018-02-10 NOTE — Telephone Encounter (Signed)
Phone call to patient to verify medication list and allergies for myelogram procedure. Pt instructed to hold cymbalta and trazodone 48hrs prior to myelogram appointment time. Pt verbalized understanding.

## 2018-02-19 ENCOUNTER — Emergency Department: Payer: Medicare Other

## 2018-02-19 ENCOUNTER — Inpatient Hospital Stay: Payer: Medicare Other

## 2018-02-19 ENCOUNTER — Encounter: Payer: Self-pay | Admitting: Emergency Medicine

## 2018-02-19 ENCOUNTER — Inpatient Hospital Stay
Admission: EM | Admit: 2018-02-19 | Discharge: 2018-02-28 | DRG: 870 | Disposition: A | Discharge status: Home / Self Care | Payer: Medicare Other | Attending: Internal Medicine | Admitting: Internal Medicine

## 2018-02-19 ENCOUNTER — Other Ambulatory Visit: Payer: Self-pay

## 2018-02-19 DIAGNOSIS — R000 Tachycardia, unspecified: Secondary | ICD-10-CM | POA: Diagnosis present

## 2018-02-19 DIAGNOSIS — I739 Peripheral vascular disease, unspecified: Secondary | ICD-10-CM | POA: Diagnosis present

## 2018-02-19 DIAGNOSIS — Z794 Long term (current) use of insulin: Secondary | ICD-10-CM | POA: Diagnosis not present

## 2018-02-19 DIAGNOSIS — E860 Dehydration: Secondary | ICD-10-CM | POA: Diagnosis present

## 2018-02-19 DIAGNOSIS — J9602 Acute respiratory failure with hypercapnia: Secondary | ICD-10-CM | POA: Diagnosis present

## 2018-02-19 DIAGNOSIS — M4850XA Collapsed vertebra, not elsewhere classified, site unspecified, initial encounter for fracture: Secondary | ICD-10-CM | POA: Diagnosis present

## 2018-02-19 DIAGNOSIS — J440 Chronic obstructive pulmonary disease with acute lower respiratory infection: Secondary | ICD-10-CM | POA: Diagnosis present

## 2018-02-19 DIAGNOSIS — Z833 Family history of diabetes mellitus: Secondary | ICD-10-CM

## 2018-02-19 DIAGNOSIS — Z4659 Encounter for fitting and adjustment of other gastrointestinal appliance and device: Secondary | ICD-10-CM

## 2018-02-19 DIAGNOSIS — E874 Mixed disorder of acid-base balance: Secondary | ICD-10-CM | POA: Diagnosis present

## 2018-02-19 DIAGNOSIS — F17290 Nicotine dependence, other tobacco product, uncomplicated: Secondary | ICD-10-CM | POA: Diagnosis present

## 2018-02-19 DIAGNOSIS — E1142 Type 2 diabetes mellitus with diabetic polyneuropathy: Secondary | ICD-10-CM | POA: Diagnosis present

## 2018-02-19 DIAGNOSIS — Z01818 Encounter for other preprocedural examination: Secondary | ICD-10-CM

## 2018-02-19 DIAGNOSIS — G8929 Other chronic pain: Secondary | ICD-10-CM | POA: Diagnosis present

## 2018-02-19 DIAGNOSIS — R6521 Severe sepsis with septic shock: Secondary | ICD-10-CM | POA: Diagnosis present

## 2018-02-19 DIAGNOSIS — N179 Acute kidney failure, unspecified: Secondary | ICD-10-CM

## 2018-02-19 DIAGNOSIS — M549 Dorsalgia, unspecified: Secondary | ICD-10-CM | POA: Diagnosis present

## 2018-02-19 DIAGNOSIS — E7800 Pure hypercholesterolemia, unspecified: Secondary | ICD-10-CM | POA: Diagnosis present

## 2018-02-19 DIAGNOSIS — Z8249 Family history of ischemic heart disease and other diseases of the circulatory system: Secondary | ICD-10-CM

## 2018-02-19 DIAGNOSIS — J189 Pneumonia, unspecified organism: Secondary | ICD-10-CM

## 2018-02-19 DIAGNOSIS — A419 Sepsis, unspecified organism: Secondary | ICD-10-CM

## 2018-02-19 DIAGNOSIS — E785 Hyperlipidemia, unspecified: Secondary | ICD-10-CM | POA: Diagnosis present

## 2018-02-19 DIAGNOSIS — Z79891 Long term (current) use of opiate analgesic: Secondary | ICD-10-CM

## 2018-02-19 DIAGNOSIS — M6289 Other specified disorders of muscle: Secondary | ICD-10-CM | POA: Diagnosis present

## 2018-02-19 DIAGNOSIS — E875 Hyperkalemia: Secondary | ICD-10-CM | POA: Diagnosis present

## 2018-02-19 DIAGNOSIS — J9601 Acute respiratory failure with hypoxia: Secondary | ICD-10-CM | POA: Diagnosis present

## 2018-02-19 DIAGNOSIS — E871 Hypo-osmolality and hyponatremia: Secondary | ICD-10-CM | POA: Diagnosis present

## 2018-02-19 DIAGNOSIS — X58XXXA Exposure to other specified factors, initial encounter: Secondary | ICD-10-CM | POA: Diagnosis present

## 2018-02-19 DIAGNOSIS — D649 Anemia, unspecified: Secondary | ICD-10-CM | POA: Diagnosis present

## 2018-02-19 DIAGNOSIS — I509 Heart failure, unspecified: Secondary | ICD-10-CM | POA: Diagnosis present

## 2018-02-19 DIAGNOSIS — R0902 Hypoxemia: Secondary | ICD-10-CM

## 2018-02-19 DIAGNOSIS — Z09 Encounter for follow-up examination after completed treatment for conditions other than malignant neoplasm: Secondary | ICD-10-CM

## 2018-02-19 DIAGNOSIS — J181 Lobar pneumonia, unspecified organism: Secondary | ICD-10-CM | POA: Diagnosis present

## 2018-02-19 DIAGNOSIS — Z885 Allergy status to narcotic agent status: Secondary | ICD-10-CM

## 2018-02-19 DIAGNOSIS — Z79899 Other long term (current) drug therapy: Secondary | ICD-10-CM | POA: Diagnosis not present

## 2018-02-19 DIAGNOSIS — R531 Weakness: Secondary | ICD-10-CM

## 2018-02-19 DIAGNOSIS — J441 Chronic obstructive pulmonary disease with (acute) exacerbation: Secondary | ICD-10-CM | POA: Diagnosis present

## 2018-02-19 DIAGNOSIS — K219 Gastro-esophageal reflux disease without esophagitis: Secondary | ICD-10-CM | POA: Diagnosis present

## 2018-02-19 DIAGNOSIS — I110 Hypertensive heart disease with heart failure: Secondary | ICD-10-CM | POA: Diagnosis present

## 2018-02-19 DIAGNOSIS — Z7982 Long term (current) use of aspirin: Secondary | ICD-10-CM

## 2018-02-19 DIAGNOSIS — R197 Diarrhea, unspecified: Secondary | ICD-10-CM | POA: Diagnosis not present

## 2018-02-19 DIAGNOSIS — J9600 Acute respiratory failure, unspecified whether with hypoxia or hypercapnia: Secondary | ICD-10-CM

## 2018-02-19 DIAGNOSIS — J9690 Respiratory failure, unspecified, unspecified whether with hypoxia or hypercapnia: Secondary | ICD-10-CM | POA: Diagnosis present

## 2018-02-19 DIAGNOSIS — A4181 Sepsis due to Enterococcus: Secondary | ICD-10-CM | POA: Diagnosis present

## 2018-02-19 LAB — RENAL FUNCTION PANEL
ALBUMIN: 2.9 g/dL — AB (ref 3.5–5.0)
ALBUMIN: 3.1 g/dL — AB (ref 3.5–5.0)
ANION GAP: 13 (ref 5–15)
Albumin: 3.2 g/dL — ABNORMAL LOW (ref 3.5–5.0)
Anion gap: 11 (ref 5–15)
Anion gap: 11 (ref 5–15)
BUN: 92 mg/dL — ABNORMAL HIGH (ref 6–20)
BUN: 91 mg/dL — ABNORMAL HIGH (ref 6–20)
BUN: 82 mg/dL — ABNORMAL HIGH (ref 6–20)
CALCIUM: 7.6 mg/dL — AB (ref 8.9–10.3)
CHLORIDE: 101 mmol/L (ref 98–111)
CHLORIDE: 102 mmol/L (ref 98–111)
CO2: 21 mmol/L — ABNORMAL LOW (ref 22–32)
CO2: 21 mmol/L — AB (ref 22–32)
CO2: 19 mmol/L — ABNORMAL LOW (ref 22–32)
CREATININE: 4.06 mg/dL — AB (ref 0.61–1.24)
CREATININE: 3.66 mg/dL — AB (ref 0.61–1.24)
Calcium: 7.7 mg/dL — ABNORMAL LOW (ref 8.9–10.3)
Calcium: 7.9 mg/dL — ABNORMAL LOW (ref 8.9–10.3)
Chloride: 99 mmol/L (ref 98–111)
Creatinine, Ser: 3.14 mg/dL — ABNORMAL HIGH (ref 0.61–1.24)
GFR calc Af Amer: 19 mL/min — ABNORMAL LOW (ref >60)
GFR calc Af Amer: 25 mL/min — ABNORMAL LOW (ref >60)
GFR calc non Af Amer: 18 mL/min — ABNORMAL LOW (ref >60)
GFR calc non Af Amer: 22 mL/min — ABNORMAL LOW (ref >60)
GFR, EST AFRICAN AMERICAN: 21 mL/min — AB (ref >60)
GFR, EST NON AFRICAN AMERICAN: 16 mL/min — AB (ref >60)
GLUCOSE: 170 mg/dL — AB (ref 70–99)
GLUCOSE: 158 mg/dL — AB (ref 70–99)
GLUCOSE: 172 mg/dL — AB (ref 70–99)
PHOSPHORUS: 7.9 mg/dL — AB (ref 2.5–4.6)
PHOSPHORUS: 6.3 mg/dL — AB (ref 2.5–4.6)
POTASSIUM: 6.6 mmol/L — AB (ref 3.5–5.1)
Phosphorus: 7.3 mg/dL — ABNORMAL HIGH (ref 2.5–4.6)
Potassium: 5.5 mmol/L — ABNORMAL HIGH (ref 3.5–5.1)
Potassium: 5.1 mmol/L (ref 3.5–5.1)
SODIUM: 131 mmol/L — AB (ref 135–145)
Sodium: 133 mmol/L — ABNORMAL LOW (ref 135–145)
Sodium: 134 mmol/L — ABNORMAL LOW (ref 135–145)

## 2018-02-19 LAB — CBC WITH DIFFERENTIAL/PLATELET
BASOS ABS: 0.1 10*3/uL (ref 0–0.1)
BASOS PCT: 0 %
EOS ABS: 0 10*3/uL (ref 0–0.7)
Eosinophils Relative: 0 %
HCT: 39.2 % — ABNORMAL LOW (ref 40.0–52.0)
Hemoglobin: 12.9 g/dL — ABNORMAL LOW (ref 13.0–18.0)
Lymphocytes Relative: 8 %
Lymphs Abs: 1.5 10*3/uL (ref 1.0–3.6)
MCH: 31 pg (ref 26.0–34.0)
MCHC: 32.8 g/dL (ref 32.0–36.0)
MCV: 94.7 fL (ref 80.0–100.0)
MONO ABS: 1.1 10*3/uL — AB (ref 0.2–1.0)
Monocytes Relative: 6 %
Neutro Abs: 16.5 10*3/uL — ABNORMAL HIGH (ref 1.4–6.5)
Neutrophils Relative %: 86 %
Platelets: 154 10*3/uL (ref 150–440)
RBC: 4.14 MIL/uL — ABNORMAL LOW (ref 4.40–5.90)
RDW: 15.1 % — AB (ref 11.5–14.5)
WBC: 19.2 10*3/uL — AB (ref 3.8–10.6)

## 2018-02-19 LAB — BASIC METABOLIC PANEL
ANION GAP: 11 (ref 5–15)
BUN: 97 mg/dL — AB (ref 6–20)
CALCIUM: 7.8 mg/dL — AB (ref 8.9–10.3)
CO2: 21 mmol/L — ABNORMAL LOW (ref 22–32)
Chloride: 100 mmol/L (ref 98–111)
Creatinine, Ser: 3.97 mg/dL — ABNORMAL HIGH (ref 0.61–1.24)
GFR calc Af Amer: 19 mL/min — ABNORMAL LOW (ref >60)
GFR, EST NON AFRICAN AMERICAN: 16 mL/min — AB (ref >60)
GLUCOSE: 176 mg/dL — AB (ref 70–99)
Potassium: 6.6 mmol/L (ref 3.5–5.1)
Sodium: 132 mmol/L — ABNORMAL LOW (ref 135–145)

## 2018-02-19 LAB — BRAIN NATRIURETIC PEPTIDE: B NATRIURETIC PEPTIDE 5: 358 pg/mL — AB (ref 0.0–100.0)

## 2018-02-19 LAB — URINALYSIS, COMPLETE (UACMP) WITH MICROSCOPIC
BACTERIA UA: NONE SEEN
BILIRUBIN URINE: NEGATIVE
Glucose, UA: NEGATIVE mg/dL
KETONES UR: NEGATIVE mg/dL
LEUKOCYTES UA: NEGATIVE
NITRITE: NEGATIVE
PROTEIN: 30 mg/dL — AB
Specific Gravity, Urine: 1.014 (ref 1.005–1.030)
pH: 5 (ref 5.0–8.0)

## 2018-02-19 LAB — GLUCOSE, CAPILLARY
GLUCOSE-CAPILLARY: 157 mg/dL — AB (ref 70–99)
GLUCOSE-CAPILLARY: 173 mg/dL — AB (ref 70–99)
Glucose-Capillary: 124 mg/dL — ABNORMAL HIGH (ref 70–99)
Glucose-Capillary: 135 mg/dL — ABNORMAL HIGH (ref 70–99)
Glucose-Capillary: 141 mg/dL — ABNORMAL HIGH (ref 70–99)
Glucose-Capillary: 155 mg/dL — ABNORMAL HIGH (ref 70–99)

## 2018-02-19 LAB — COMPREHENSIVE METABOLIC PANEL
ALBUMIN: 3.4 g/dL — AB (ref 3.5–5.0)
ALT: 21 U/L (ref 0–44)
AST: 23 U/L (ref 15–41)
Alkaline Phosphatase: 94 U/L (ref 38–126)
Anion gap: 16 — ABNORMAL HIGH (ref 5–15)
BUN: 91 mg/dL — AB (ref 6–20)
CHLORIDE: 96 mmol/L — AB (ref 98–111)
CO2: 19 mmol/L — ABNORMAL LOW (ref 22–32)
Calcium: 8.2 mg/dL — ABNORMAL LOW (ref 8.9–10.3)
Creatinine, Ser: 4.16 mg/dL — ABNORMAL HIGH (ref 0.61–1.24)
GFR calc Af Amer: 18 mL/min — ABNORMAL LOW (ref >60)
GFR calc non Af Amer: 16 mL/min — ABNORMAL LOW (ref >60)
GLUCOSE: 153 mg/dL — AB (ref 70–99)
POTASSIUM: 6.6 mmol/L — AB (ref 3.5–5.1)
Sodium: 131 mmol/L — ABNORMAL LOW (ref 135–145)
Total Bilirubin: 1.2 mg/dL (ref 0.3–1.2)
Total Protein: 7.8 g/dL (ref 6.5–8.1)

## 2018-02-19 LAB — LACTIC ACID, PLASMA
Lactic Acid, Venous: 1.6 mmol/L (ref 0.5–1.9)
Lactic Acid, Venous: 1.9 mmol/L (ref 0.5–1.9)

## 2018-02-19 LAB — TROPONIN I
TROPONIN I: 0.09 ng/mL — AB (ref <0.03)
Troponin I: 0.05 ng/mL (ref <0.03)
Troponin I: 0.12 ng/mL (ref <0.03)

## 2018-02-19 LAB — MAGNESIUM
MAGNESIUM: 2.1 mg/dL (ref 1.7–2.4)
Magnesium: 1.9 mg/dL (ref 1.7–2.4)
Magnesium: 2 mg/dL (ref 1.7–2.4)

## 2018-02-19 LAB — PROCALCITONIN: Procalcitonin: 23.9 ng/mL

## 2018-02-19 LAB — TRIGLYCERIDES: TRIGLYCERIDES: 480 mg/dL — AB (ref <150)

## 2018-02-19 LAB — MRSA PCR SCREENING: MRSA BY PCR: NEGATIVE

## 2018-02-19 MED ORDER — VANCOMYCIN HCL IN DEXTROSE 1-5 GM/200ML-% IV SOLN
1000.0000 mg | INTRAVENOUS | Status: DC
  Administered 2018-02-20: 1000 mg via INTRAVENOUS
  Filled 2018-02-19 (×2): qty 200

## 2018-02-19 MED ORDER — VANCOMYCIN HCL IN DEXTROSE 1-5 GM/200ML-% IV SOLN: 1000.0000 mg | Freq: Once | INTRAVENOUS | Status: DC

## 2018-02-19 MED ORDER — SODIUM CHLORIDE 0.9 % IV BOLUS
1000.0000 mL | Freq: Once | INTRAVENOUS | Status: AC
  Administered 2018-02-19: 1000 mL via INTRAVENOUS

## 2018-02-19 MED ORDER — IPRATROPIUM-ALBUTEROL 0.5-2.5 (3) MG/3ML IN SOLN
3.0000 mL | Freq: Once | RESPIRATORY_TRACT | Status: AC
  Administered 2018-02-19: 3 mL via RESPIRATORY_TRACT

## 2018-02-19 MED ORDER — SODIUM CHLORIDE 0.9 % IV SOLN
500.0000 mg | INTRAVENOUS | Status: AC
  Administered 2018-02-19 – 2018-02-23 (×5): 500 mg via INTRAVENOUS
  Filled 2018-02-19 (×5): qty 500

## 2018-02-19 MED ORDER — MIDAZOLAM HCL 2 MG/2ML IJ SOLN
2.0000 mg | INTRAMUSCULAR | Status: DC | PRN
  Administered 2018-02-20 – 2018-02-24 (×19): 4 mg via INTRAVENOUS
  Administered 2018-02-24: 2 mg via INTRAVENOUS
  Administered 2018-02-24 (×2): 4 mg via INTRAVENOUS
  Administered 2018-02-25: 2 mg via INTRAVENOUS
  Administered 2018-02-25 – 2018-02-26 (×5): 4 mg via INTRAVENOUS
  Filled 2018-02-19 (×12): qty 4

## 2018-02-19 MED ORDER — NOREPINEPHRINE 16 MG/250ML-% IV SOLN
0.0000 ug/min | INTRAVENOUS | Status: DC
  Administered 2018-02-19 – 2018-02-23 (×2): 2 ug/min via INTRAVENOUS
  Administered 2018-02-23: 1 ug/min via INTRAVENOUS
  Filled 2018-02-19 (×3): qty 250

## 2018-02-19 MED ORDER — PROPOFOL 1000 MG/100ML IV EMUL
INTRAVENOUS | Status: AC
  Filled 2018-02-19: qty 100

## 2018-02-19 MED ORDER — PROPOFOL 1000 MG/100ML IV EMUL
5.0000 ug/kg/min | INTRAVENOUS | Status: DC
  Administered 2018-02-19 (×3): 40 ug/kg/min via INTRAVENOUS
  Administered 2018-02-19: 30 ug/kg/min via INTRAVENOUS
  Administered 2018-02-20: 25 ug/kg/min via INTRAVENOUS
  Administered 2018-02-20: 40 ug/kg/min via INTRAVENOUS
  Filled 2018-02-19 (×5): qty 100

## 2018-02-19 MED ORDER — FENTANYL 2500MCG IN NS 250ML (10MCG/ML) PREMIX INFUSION
0.0000 ug/h | INTRAVENOUS | Status: DC
  Administered 2018-02-19: 200 ug/h via INTRAVENOUS
  Administered 2018-02-19: 300 ug/h via INTRAVENOUS
  Administered 2018-02-20 (×4): 400 ug/h via INTRAVENOUS
  Administered 2018-02-21: 300 ug/h via INTRAVENOUS
  Administered 2018-02-21 – 2018-02-25 (×15): 400 ug/h via INTRAVENOUS
  Filled 2018-02-19 (×23): qty 250

## 2018-02-19 MED ORDER — DEXTROSE 50 % IV SOLN: 25.0000 g | Freq: Once | INTRAVENOUS | Status: DC

## 2018-02-19 MED ORDER — FAMOTIDINE IN NACL 20-0.9 MG/50ML-% IV SOLN
20.0000 mg | INTRAVENOUS | Status: DC
  Administered 2018-02-19 – 2018-02-22 (×4): 20 mg via INTRAVENOUS
  Filled 2018-02-19 (×4): qty 50

## 2018-02-19 MED ORDER — HEPARIN SODIUM (PORCINE) 5000 UNIT/ML IJ SOLN
5000.0000 [IU] | Freq: Three times a day (TID) | INTRAMUSCULAR | Status: DC
  Administered 2018-02-19 – 2018-02-28 (×27): 5000 [IU] via SUBCUTANEOUS
  Filled 2018-02-19 (×27): qty 1

## 2018-02-19 MED ORDER — SODIUM CHLORIDE 0.9 % IV SOLN
10.0000 mg | Freq: Once | INTRAVENOUS | Status: DC
  Filled 2018-02-19: qty 10

## 2018-02-19 MED ORDER — FENTANYL CITRATE (PF) 100 MCG/2ML IJ SOLN
INTRAMUSCULAR | Status: AC
  Filled 2018-02-19: qty 2

## 2018-02-19 MED ORDER — PROPOFOL 1000 MG/100ML IV EMUL
20.0000 ug/kg/min | Freq: Once | INTRAVENOUS | Status: AC
  Administered 2018-02-19: 14:00:00 via INTRAVENOUS

## 2018-02-19 MED ORDER — VANCOMYCIN HCL 10 G IV SOLR
2000.0000 mg | Freq: Once | INTRAVENOUS | Status: AC
  Administered 2018-02-19: 2000 mg via INTRAVENOUS
  Filled 2018-02-19: qty 2000

## 2018-02-19 MED ORDER — SODIUM CHLORIDE 0.9 % IV SOLN
2.0000 g | Freq: Two times a day (BID) | INTRAVENOUS | Status: DC
  Administered 2018-02-19 – 2018-02-25 (×12): 2 g via INTRAVENOUS
  Filled 2018-02-19 (×14): qty 2

## 2018-02-19 MED ORDER — INSULIN ASPART 100 UNIT/ML ~~LOC~~ SOLN
2.0000 [IU] | SUBCUTANEOUS | Status: DC
  Administered 2018-02-19 (×2): 2 [IU] via SUBCUTANEOUS
  Administered 2018-02-19 – 2018-02-20 (×2): 4 [IU] via SUBCUTANEOUS
  Administered 2018-02-20 (×2): 2 [IU] via SUBCUTANEOUS
  Administered 2018-02-20: 4 [IU] via SUBCUTANEOUS
  Administered 2018-02-21 (×2): 2 [IU] via SUBCUTANEOUS
  Administered 2018-02-21 (×2): 4 [IU] via SUBCUTANEOUS
  Administered 2018-02-21: 2 [IU] via SUBCUTANEOUS
  Administered 2018-02-21 (×2): 4 [IU] via SUBCUTANEOUS
  Administered 2018-02-22: 6 [IU] via SUBCUTANEOUS
  Administered 2018-02-22 (×2): 4 [IU] via SUBCUTANEOUS
  Administered 2018-02-22: 2 [IU] via SUBCUTANEOUS
  Administered 2018-02-22: 4 [IU] via SUBCUTANEOUS
  Filled 2018-02-19 (×19): qty 1

## 2018-02-19 MED ORDER — PUREFLOW DIALYSIS SOLUTION
INTRAVENOUS | Status: DC
  Administered 2018-02-19 – 2018-02-20 (×2): via INTRAVENOUS_CENTRAL

## 2018-02-19 MED ORDER — SODIUM CHLORIDE 0.9 % IV SOLN
2.0000 g | INTRAVENOUS | Status: DC
  Administered 2018-02-19: 2 g via INTRAVENOUS
  Filled 2018-02-19: qty 2

## 2018-02-19 MED ORDER — HEPARIN SODIUM (PORCINE) 1000 UNIT/ML DIALYSIS
1000.0000 [IU] | INTRAMUSCULAR | Status: DC | PRN
  Administered 2018-02-19 (×2): 1400 [IU] via INTRAVENOUS_CENTRAL
  Administered 2018-02-20: 2800 [IU] via INTRAVENOUS_CENTRAL
  Filled 2018-02-19: qty 6
  Filled 2018-02-19: qty 1
  Filled 2018-02-19 (×3): qty 6

## 2018-02-19 MED ORDER — IPRATROPIUM-ALBUTEROL 0.5-2.5 (3) MG/3ML IN SOLN
RESPIRATORY_TRACT | Status: AC
  Filled 2018-02-19: qty 3

## 2018-02-19 MED ORDER — INSULIN ASPART 100 UNIT/ML ~~LOC~~ SOLN: 10.0000 [IU] | Freq: Once | SUBCUTANEOUS | Status: DC

## 2018-02-19 MED ORDER — ALTEPLASE 2 MG IJ SOLR
2.0000 mg | Freq: Once | INTRAMUSCULAR | Status: AC
  Administered 2018-02-19: 2 mg

## 2018-02-19 MED ORDER — FUROSEMIDE 10 MG/ML IJ SOLN
40.0000 mg | Freq: Once | INTRAMUSCULAR | Status: AC
  Administered 2018-02-19: 40 mg via INTRAVENOUS
  Filled 2018-02-19: qty 4

## 2018-02-19 NOTE — Progress Notes (Signed)
Patient ID: Martin BonnetJames Calvin Riddle, male   DOB: 02/18/1971, 47 y.o.   MRN: [id number] Pulmonary/critical care  Procedure note Vascular access catheter for hemodialysis Indication: Acute renal failure with hyperkalemia Emergently performed Right femoral location utilized Complete contact barrier precaution Ultrasound guidance identified the right femoral vein First pass resulted in acquisition of right femoral vein Guidewire placed and needle removed Using the Seldinger technique and sequential dilation a #20 hemodialysis try catheter was placed without difficulty. The catheter ports were all flushed and had dark nonpulsatile venous return The guidewire was removed intact Sutured into place Dressed by nurse  Tora KindredJohn Kile Kabler, D.O.

## 2018-02-19 NOTE — Progress Notes (Signed)
Patient with severe hyperkalemia, acute renal failure, acute respiratory failure.  Will proceed with CRRT, appreciate Dr. Lutricia Horsfallonforti's assistance with temporary dialysis catheter placement.

## 2018-02-19 NOTE — ED Notes (Signed)
Pt fighting tubes and had BM. Respiratory at bedside suctioning pt

## 2018-02-19 NOTE — ED Notes (Signed)
This RN and EDP in room, pt is red, diaphoretic and tachypneic and minimally responsive. EDP gave verbal order for Narcan and to prepare for intubation

## 2018-02-19 NOTE — ED Notes (Signed)
100 [facility] Fent [facility]

## 2018-02-19 NOTE — ED Notes (Signed)
100 of Ketamine given by RN Pattricia BossAnnie at this time. Verbal order by MD Lenard LancePaduchowski

## 2018-02-19 NOTE — Progress Notes (Signed)
CODE SEPSIS - PHARMACY COMMUNICATION  **Broad Spectrum Antibiotics should be administered within 1 hour of Sepsis diagnosis**  Time Code Sepsis Called/Page Received: 1141  Antibiotics Ordered: Azithromycin/Ceftriaxone  Time of 1st antibiotic administration: Ceftriaxone 1158  Additional action taken by pharmacy: none  If necessary, Name of Provider/Nurse Contacted: none indicated.     Bertram SavinSimpson,Michael L ,PharmD Clinical Pharmacist  02/19/2018  12:17 PM

## 2018-02-19 NOTE — ED Provider Notes (Addendum)
[facility] Emergency Department Provider Note  Time seen: 11:15 AM  I have reviewed the triage vital signs and the nursing notes.   HISTORY  Chief Complaint Weakness    HPI Martin Riddle is a 47 y.o. male with a past medical history of diabetes, hypertension, hyperlipidemia, pancreatitis, chronic back pain with a spinal stimulator implanted, on chronic pain medication who presents to the emergency department for decreased responsiveness.  According to the wife over the past 1 week the patient has been increasingly more weak and fatigued.  Since yesterday states the patient has been extremely sleepy.  Today the patient was nearly unresponsive was very slow to respond to questions and request from the wife.  They brought him to the emergency department for evaluation.  Upon arrival patient remains very somnolent was able to stand to transfer from wheelchair to bed eyes remained closed throughout exam.  Patient is hypoxic in the 80s on 6 L of oxygen via nasal cannula.  No home O2 requirement.   Past Medical History:  Diagnosis Date  . Diabetes mellitus without complication (HCC)   . Hypercholesteremia   . Hypertension   . Pancreatitis     Patient Active Problem List   Diagnosis Date Noted  . Severe recurrent major depression with psychotic features (HCC) 01/09/2017  . Congenital flat foot 01/16/2015  . DDD (degenerative disc disease), lumbar 01/16/2015  . Diabetes mellitus, type 2 (HCC) 01/16/2015  . Hypercholesteremia 01/16/2015  . Gastro-esophageal reflux disease without esophagitis 01/16/2015  . Secondary peripheral neuropathy (HCC) 01/16/2015    Past Surgical History:  Procedure Laterality Date  . ANKLE SURGERY Left 2001,2003  . BACK SURGERY  2003,2012    Prior to Admission medications   Medication Sig Start Date End Date Taking? Authorizing Provider  aspirin EC 81 MG EC tablet Take 1 tablet (81 mg total) by mouth daily. 12/11/15   Enedina FinnerPatel, Sona,  MD  DULoxetine (CYMBALTA) 30 MG capsule Take 3 capsules (90 mg total) by mouth daily. 01/15/17   Jimmy FootmanHernandez-Gonzalez, Andrea, MD  gemfibrozil (LOPID) 600 MG tablet Take 1 tablet (600 mg total) by mouth 2 (two) times daily before a meal. 01/14/17   Jimmy FootmanHernandez-Gonzalez, Andrea, MD  glipiZIDE (GLUCOTROL XL) 10 MG 24 hr tablet Take 1 tablet (10 mg total) by mouth daily with breakfast. 01/15/17   Jimmy FootmanHernandez-Gonzalez, Andrea, MD  insulin aspart (NOVOLOG) 100 UNIT/ML injection Inject 20 Units into the skin 3 (three) times daily with meals. 01/14/17   Jimmy FootmanHernandez-Gonzalez, Andrea, MD  insulin detemir (LEVEMIR) 100 UNIT/ML injection Inject 0.35 mLs (35 Units total) into the skin 2 (two) times daily. 01/14/17   Jimmy FootmanHernandez-Gonzalez, Andrea, MD  metFORMIN (GLUCOPHAGE) 1000 MG tablet Take 1 tablet (1,000 mg total) by mouth 2 (two) times daily with a meal. 01/14/17   Jimmy FootmanHernandez-Gonzalez, Andrea, MD  methocarbamol (ROBAXIN) 750 MG tablet Take 1 tablet (750 mg total) by mouth every 6 (six) hours as needed for muscle spasms. 01/15/17   Jimmy FootmanHernandez-Gonzalez, Andrea, MD  oxyCODONE 20 MG TABS Take 1 tablet (20 mg total) by mouth 4 (four) times daily -  with meals and at bedtime. 01/15/17   Jimmy FootmanHernandez-Gonzalez, Andrea, MD  polyethylene glycol (MIRALAX / GLYCOLAX) packet Take 17 g by mouth daily. 01/15/17   Jimmy FootmanHernandez-Gonzalez, Andrea, MD  traZODone (DESYREL) 150 MG tablet Take 1 tablet (150 mg total) by mouth at bedtime. 01/14/17   Jimmy FootmanHernandez-Gonzalez, Andrea, MD    Allergies  Allergen Reactions  . Morphine And Related Other (See Comments)  headache    Family History  Problem Relation Age of Onset  . Heart disease Mother   . Diabetes Mother   . Diabetes Sister     Social History Social History   Tobacco Use  . Smoking status: Former Smoker    Years: 7.00  . Smokeless tobacco: Current User  Substance Use Topics  . Alcohol use: No    Alcohol/week: 0.0 standard drinks  . Drug use: No    Review of Systems Constitutional: Negative  for fever. Cardiovascular: Negative for chest pain. Respiratory: Positive for cough. Gastrointestinal: Negative for abdominal pain, vomiting and diarrhea. Genitourinary: Negative for urinary compaints Musculoskeletal: Negative for musculoskeletal complaints Skin: Negative for skin complaints  Neurological: Negative for headache All other ROS negative  ____________________________________________   PHYSICAL EXAM:  VITAL SIGNS: ED Triage Vitals  Enc Vitals Group     BP 02/19/18 1053 133/80     Pulse Rate 02/19/18 1053 (!) 103     Resp 02/19/18 1053 (!) 24     Temp 02/19/18 1053 97.8 F (36.6 C)     Temp Source 02/19/18 1053 Oral     SpO2 02/19/18 1053 (!) 83 %     Weight 02/19/18 1054 (!) 320 lb (145.2 kg)     Height 02/19/18 1054 6\' 4"  (1.93 m)     Head Circumference --      Peak Flow --      Pain Score --      Pain Loc --      Pain Edu? --      Excl. in GC? --    Constitutional: Patient is very somnolent, he is able to answer questions but very slow to respond. Eyes: Keeps eyes closed most of the exam however Martin open them on command appears to have 2 mm pupils reactive bilaterally. ENT   Head: Normocephalic and atraumatic   Mouth/Throat: Dry appearing mucous membranes. Cardiovascular: Normal rate, regular rhythm around 100 bpm no obvious murmur. Respiratory: Patient does have mild tachypnea, has coarse breath sounds bilaterally most consistent of the rhonchi. Gastrointestinal: Soft and nontender. No distention.   Musculoskeletal: Nontender with normal range of motion in all extremities. No lower extremity tenderness or edema. Neurologic: Very somnolent but does move all extremities.  Is able to answer questions does have mildly slurred speech as well. Skin:  Skin is warm, dry and intact.  Psychiatric: Somnolent.  ____________________________________________    EKG  EKG reviewed and interpreted by myself shows sinus rhythm at 99 bpm, narrow QRS, normal axis,  normal intervals with nonspecific ST changes.  ____________________________________________    RADIOLOGY  Chest x-ray is consistent with pneumonia versus pulmonary edema  ____________________________________________   INITIAL IMPRESSION / ASSESSMENT AND PLAN / ED COURSE  Pertinent labs & imaging results that were available during my care of the patient were reviewed by me and considered in my medical decision making (see chart for details).  Patient presents to the emergency department with decreased responsiveness since yesterday with increased weakness over the past 1 week.  Upon arrival patient is somnolent with coarse rhonchi bilaterally.  Patient satting in the 80s on 6 L of oxygen via nasal cannula switched to nonrebreather mask.  As soon as IV access was established patient was given 0.5 mg of Narcan and within 2 minutes was awake alert talking complaining of pain diffusely throughout his body especially in his back.  Highly suspect a degree of opioid intoxication, denies taking more than his prescribed amount.  We Martin  check labs to ensure for normal renal function.  Given the coarse breath sounds Martin obtain chest x-ray to rule out pneumonia, aspiration pneumonia or pulmonary edema.  Differential is quite broad at this time but would include metabolic or electrolyte abnormality, ACS, pneumonia, aspiration event, hypoxia due to opioid intoxication.  Labs have resulted showing significant renal failure creatinine is usually around 1.9 is currently 4.1.  Patient's potassium is also 6.6.  We are covering the possible pneumonia with antibiotics as well as dosing a small dose of IV Lasix given the possibility of pneumonia versus pulmonary edema.  We Martin dose insulin and glucose and maintain finger only blood sugar checks given his hyperkalemia.  No concerning EKG findings.  Patient Martin be admitted to the hospitalist service for continued treatment.  Patient and wife agreeable to plan of  care.   Patient has acutely decompensated, breathing over 40 times per minute and extremely diaphoretic and largely unresponsive once again.  Patient was given 2 mg of Narcan.  He did awake but breathing increased to 60 breaths/min continue to be extremely diaphoretic was not mentating well screaming, very coarse breath sounds.  Decision was made to intubate the patient by myself.  Intubation performed without incident.  I anticipate the patient Martin likely be hard to keep sedated with his current state after Narcan.  We Martin start the patient on a propofol drip, Martin likely start fentanyl drip as well once Narcan has worn off somewhat.  Patient Martin need ICU admission.  INTUBATION Performed by: Minna Antis  Required items: required blood products, implants, devices, and special equipment available Patient identity confirmed: provided demographic data and hospital-assigned identification number Time out: Immediately prior to procedure a "time out" was called to verify the correct patient, procedure, equipment, support staff and site/side marked as required.  Indications: airway protection/unresponsive  Intubation method: Glidescope Laryngoscopy   Preoxygenation: 100% BVM  Sedatives: 30 mg etomidate Paralytic: 120 mg succinylcholine  Tube Size: 8.0 cuffed  Post-procedure assessment: chest rise and ETCO2 monitor Breath sounds: equal and absent over the epigastrium Tube secured with: ETT holder Chest x-ray interpreted by radiologist and me.  Chest x-ray findings: endotracheal tube in appropriate position  Patient tolerated the procedure well with no immediate complications.    ----------------------------------------- 1:58 PM on 02/19/2018 -----------------------------------------  We are having significant difficulty keeping the patient sedated.  Actively pulling out his breathing tube.  Very altered, has defecated liquid stool all over himself.  Patient dosed propofol for  sedation I have ordered a fentanyl drip as well for sedation.  Required a one-time push of fentanyl and Versed.  Fentanyl likely not as active given recent Narcan use.  Dose 100 mg of ketamine, which has appeared to help significantly.  Patient now calm in bed with propofol infusing.  We Martin admit to the intensive care unit.       CRITICAL CARE Performed by: Minna Antis   Total critical care time: 60 minutes  Critical care time was exclusive of separately billable procedures and treating other patients.  Critical care was necessary to treat or prevent imminent or life-threatening deterioration.  Critical care was time spent personally by me on the following activities: development of treatment plan with patient and/or surrogate as well as nursing, discussions with consultants, evaluation of patient's response to treatment, examination of patient, obtaining history from patient or surrogate, ordering and performing treatments and interventions, ordering and review of laboratory studies, ordering and review of radiographic studies, pulse oximetry and re-evaluation  of patient's condition.   ____________________________________________   FINAL CLINICAL IMPRESSION(S) / ED DIAGNOSES  Altered mental status Decreased responsiveness    Minna Antis, MD 02/19/18 1303    Minna Antis, MD 02/19/18 1337    Minna Antis, MD 02/19/18 1359

## 2018-02-19 NOTE — ED Notes (Signed)
Sent full rainbow down with 2nd set of blood cultures to lab. Informed Jacee in lab of specimens coming

## 2018-02-19 NOTE — ED Notes (Signed)
Report given to Brad in ICU.  

## 2018-02-19 NOTE — ED Triage Notes (Signed)
Arrives with wife, with c/o sinus congestion and weakness x 2 days.  Patient arrives tachypneic, non verbal.  Wife with patient who states patient was just talking to her in the car.

## 2018-02-19 NOTE — Consult Note (Addendum)
Reason for Consult:Respiratory Failure Referring Physician: Dr. Matthew Saras [name] is an 47 y.o. male.  HPI: Mr. Martin Riddle is a 47 year old gentleman with a past medical history remarkable for hypertension, hyperlipidemia, diabetes mellitus, pancreatitis, chronic back pain with implantable spinal stimulator and chronic narcotic use was brought into the emergency department with decreased responsiveness and generalized weakness. Over the past week he has had an upper respiratory tract infection although his wife reports that he had improved until 2 days ago. Patient began feeling more weak and fatigued. She denied that he had any fever or chills. In emergency department patient became more somnolent with increased work of breathing and respiratory distress and was subsequently intubated. Pertinent labs reveal sodium 131, potassium 6.6, BUN 91, creatinine 4.16, troponin 0.05, anion gap of 16, leukocytosis of 19, anemia at hemoglobin 12.9  Past Medical History:  Diagnosis Date  . Diabetes mellitus without complication ([place])   . Hypercholesteremia   . Hypertension   . Pancreatitis     Past Surgical History:  Procedure Laterality Date  . ANKLE SURGERY Left 2001,2003  . BACK SURGERY  2003,2012    Family History  Problem Relation Age of Onset  . Heart disease Mother   . Diabetes Mother   . Diabetes Sister     Social History:  reports that he has quit smoking. He quit after 7.00 years of use. He uses smokeless tobacco. He reports that he does not drink alcohol or use drugs.  Allergies:  Allergies  Allergen Reactions  . Morphine And Related Other (See Comments)    headache    Medications: I have reviewed the patient's current medications.  Results for orders placed or performed during the hospital encounter of 02/19/18 (from the past 48 hour(s))  Glucose, capillary     Status: Abnormal   Collection Time: 02/19/18 10:53 AM  Result Value Ref Range   Glucose-Capillary 124 (H) 70 - 99  mg/dL  Lactic acid, plasma     Status: None   Collection Time: 02/19/18 11:02 AM  Result Value Ref Range   Lactic Acid, Venous 1.6 0.5 - 1.9 mmol/L    Comment: Performed at [facility] [address] [place], [place] [address]  Urinalysis, Complete w Microscopic     Status: Abnormal   Collection Time: 02/19/18 11:36 AM  Result Value Ref Range   Color, Urine YELLOW (A) YELLOW   APPearance HAZY (A) CLEAR   Specific Gravity, Urine 1.014 1.005 - 1.030   pH 5.0 5.0 - 8.0   Glucose, UA NEGATIVE NEGATIVE mg/dL   Hgb urine dipstick MODERATE (A) NEGATIVE   Bilirubin Urine NEGATIVE NEGATIVE   Ketones, ur NEGATIVE NEGATIVE mg/dL   Protein, ur 30 (A) NEGATIVE mg/dL   Nitrite NEGATIVE NEGATIVE   Leukocytes, UA NEGATIVE NEGATIVE   RBC / HPF 21-50 0 - 5 RBC/hpf   WBC, UA 11-20 0 - 5 WBC/hpf   Bacteria, UA NONE SEEN NONE SEEN   Squamous Epithelial / LPF 0-5 0 - 5   Mucus PRESENT     Comment: Performed at [facility], [address] [place], [place] [address]  Lactic acid, plasma     Status: None   Collection Time: 02/19/18 11:55 AM  Result Value Ref Range   Lactic Acid, Venous 1.9 0.5 - 1.9 mmol/L    Comment: Performed at [facility], [address] [place], [place] [address]  Brain natriuretic peptide     Status: Abnormal   Collection Time: 02/19/18  11:55 AM  Result Value Ref Range   B Natriuretic Peptide 358.0 (H) 0.0 - 100.0 pg/mL    Comment: Performed at [facility], [address] [place], [place] [address]  CBC with Differential/Platelet     Status: Abnormal   Collection Time: 02/19/18 11:55 AM  Result Value Ref Range   WBC 19.2 (H) 3.8 - 10.6 K/uL   RBC 4.14 (L) 4.40 - 5.90 MIL/uL   Hemoglobin 12.9 (L) 13.0 - 18.0 g/dL   HCT 39.2 (L) 40.0 - 52.0 %   MCV 94.7 80.0 - 100.0 fL   MCH 31.0 26.0 - 34.0 pg   MCHC 32.8 32.0 - 36.0 g/dL   RDW 15.1 (H) 11.5 - 14.5 %   Platelets 154 150 - 440 K/uL   Neutrophils Relative % 86 %   Neutro Abs  16.5 (H) 1.4 - 6.5 K/uL   Lymphocytes Relative 8 %   Lymphs Abs 1.5 1.0 - 3.6 K/uL   Monocytes Relative 6 %   Monocytes Absolute 1.1 (H) 0.2 - 1.0 K/uL   Eosinophils Relative 0 %   Eosinophils Absolute 0.0 0 - 0.7 K/uL   Basophils Relative 0 %   Basophils Absolute 0.1 0 - 0.1 K/uL    Comment: Performed at [facility], [address] [place], [place] [address]  Comprehensive metabolic panel     Status: Abnormal   Collection Time: 02/19/18 11:55 AM  Result Value Ref Range   Sodium 131 (L) 135 - 145 mmol/L   Potassium 6.6 (HH) 3.5 - 5.1 mmol/L    Comment: CRITICAL RESULT CALLED TO, READ BACK BY AND VERIFIED WITH SAMANTHA HAMILTON 02/19/18 @ 1254  MLK    Chloride 96 (L) 98 - 111 mmol/L   CO2 19 (L) 22 - 32 mmol/L   Glucose, Bld 153 (H) 70 - 99 mg/dL   BUN 91 (H) 6 - 20 mg/dL   Creatinine, Ser 4.16 (H) 0.61 - 1.24 mg/dL   Calcium 8.2 (L) 8.9 - 10.3 mg/dL   Total Protein 7.8 6.5 - 8.1 g/dL   Albumin 3.4 (L) 3.5 - 5.0 g/dL   AST 23 15 - 41 U/L   ALT 21 0 - 44 U/L   Alkaline Phosphatase 94 38 - 126 U/L   Total Bilirubin 1.2 0.3 - 1.2 mg/dL   GFR calc non Af Amer 16 (L) >60 mL/min   GFR calc Af Amer 18 (L) >60 mL/min    Comment: (NOTE) The eGFR has been calculated using the CKD EPI equation. This calculation has not been validated in all clinical situations. eGFR's persistently <60 mL/min signify possible Chronic Kidney Disease.    Anion gap 16 (H) 5 - 15    Comment: Performed at [facility], [address] [place], [place] [address]  Troponin I     Status: Abnormal   Collection Time: 02/19/18 11:55 AM  Result Value Ref Range   Troponin I 0.05 (HH) <0.03 ng/mL    Comment: CRITICAL RESULT CALLED TO, READ BACK BY AND VERIFIED WITH SAMANTHA HAMILTON 02/19/18 @ 1254  [place] Performed at [facility], [address] [place], [place] [address]   Glucose, capillary     Status: Abnormal   Collection Time: 02/19/18  1:44 PM  Result Value Ref Range    Glucose-Capillary 157 (H) 70 - 99 mg/dL    Dg Chest Portable 1 View  Result Date: 02/19/2018 CLINICAL DATA:  Post intubation as brought to ER today due to increased unresponsiveness.  EXAM: PORTABLE CHEST 1 VIEW COMPARISON:  02/19/2018 at 11:05 a.m. FINDINGS: Patient is rotated to the right. There is been interval placement of an endotracheal tube with tip 6.3 cm above the carina. Lungs are somewhat hypoinflated demonstrate continued hazy opacification over the left upper lung without significant change. Cardiomediastinal silhouette and remainder of the exam is unchanged. IMPRESSION: Stable hazy opacification over the left upper lobe. Endotracheal tube with tip 6.3 cm above the carina. Electronically Signed   By: Marin Olp M.D.   On: 02/19/2018 13:55   Dg Chest Portable 1 View  Result Date: 02/19/2018 CLINICAL DATA:  Weakness, altered mental status EXAM: PORTABLE CHEST 1 VIEW COMPARISON:  Chest x-ray dated 01/08/2017. FINDINGS: Ill-defined airspace opacity within the LEFT upper lung, pneumonia versus asymmetric edema. RIGHT lung is clear. No pleural effusion seen. Heart size and mediastinal contours are likely accentuated by patient's semi-erect and slightly oblique positioning. No acute or suspicious osseous finding. IMPRESSION: LEFT upper lobe pneumonia versus asymmetric pulmonary edema. Electronically Signed   By: Franki Cabot M.D.   On: 02/19/2018 11:25    ROS  Unable to obtain review of systems as patient is intubated. Please see history of present illness  Blood pressure (!) 155/110, pulse (!) 111, temperature (!) 96.2 F (35.7 C), resp. rate (!) 45, height _0  (1.93 m), weight (!) 145.2 kg, SpO2 94 %. Physical Exam  Vital signs: Please see the above listed vital signs HEENT: Patient is intubated, oral endotracheal tube and oral gastric tube in place, face is somewhat flushed, trachea midline, no thyromegaly appreciated, no jugular venous distention is  noted Cardiovascular: Tachycardia appreciated sinus mechanism Pulmonary: Coarse rhonchi appreciated left greater than right Abdominal: Positive bowel sounds, soft exam Extremities: Multiple excoriations noted bilateral lower extremity, chronic discoloration from peripheral vascular disease, 1+ edema noted Neurologic: Patient moves all extremities, intubated on sedation   Assessment/Plan:  Respiratory failure. Patient required intubation and placed on mechanical ventilation. Left upper lobe pneumonia noted on chest x-ray. Started on vancomycin and cefepime with blood and sputum cultures obtained  Sepsis. Blood pressure initially in the 150s starting to decrease now with systolic pressure of 579  Renal failure. elevated BUN/creatinine most likely secondary to ATN from sepsis. We'll recheck BMP, consult nephrology, will most likelyneed hemodialysis. Will consult nephrology and anticipate vascular access catheter to be placed. Workup for secondary causes to include renal ultrasound  Acid-based derangement. Anion gap of 16, delta gap of 4, calculated initial starting bicarbonate 23. Arterial blood gas not obtained for complete interpretation  Hyperkalemia. Will repeat BMP. Emergency department treated with hyperkalemia protocol.  Hyponatremia pending repeat BMP  Elevated troponin. EKG did not reveal any acute ST segment changes  Diabetes. ICU protocol  Critical care time one hour   Ekansh Sherk 02/19/2018, 2:08 PM

## 2018-02-19 NOTE — ED Notes (Signed)
Etomidate 30 mg given at 1322 by Pattricia BossAnnie, RN  Succinylcholine 120 mg given at 1323 by Pattricia BossAnnie, [place]

## 2018-02-19 NOTE — Progress Notes (Signed)
Pharmacy Antibiotic Note  Martin Riddle is a 47 y.o. male admitted on 02/19/2018 with respiratory failure. Patient presenting with acute renal failure with hyperkalemia, Martin start on CRRT.  Pharmacy has been consulted for vancomycin and cefepime dosing with concern for PNA. Patient received one dose of ceftriaxone 2 g in the ED.  Plan: Martin give vancomycin 2 g IV x 1. Start 1 g IV daily 9/14 @1200 . Goal trough 15-20 mcg/mL. Martin get level with third dose and as clinically indicated.  Start cefepime 2 g IV q12h  Continue azithromycin 500 mg daily for a total of 5 days of therapy  Height: 6\' 4"  (193 cm) Weight: (!) 320 lb (145.2 kg) IBW/kg (Calculated) : 86.8  Temp (24hrs), Avg:97.9 F (36.6 C), Min:96.2 F (35.7 C), Max:98.6 F (37 C)  Recent Labs  Lab 02/19/18 1102 02/19/18 1155  WBC  --  19.2*  CREATININE  --  4.16*  LATICACIDVEN 1.6 1.9    Estimated Creatinine Clearance: 33.8 mL/min (A) (by C-G formula based on SCr of 4.16 mg/dL (H)).    Allergies  Allergen Reactions  . Morphine And Related Other (See Comments)    headache    Antimicrobials this admission: Ceftriaxone 9/13 x 1 Azithromycin 9/13 >> 9/17 Vancomycin 9/13 >> Cefepime 9/13 >>  Dose adjustments this admission: N/A  Microbiology results: 9/13 BCx: pending 9/13 UCx: pending   9/13 MRSA PCR: pending  Thank you for allowing pharmacy to be a part of this patient's care.  Pricilla RiffleAbby K Ellington, PharmD Pharmacy Resident  02/19/2018 3:21 PM

## 2018-02-19 NOTE — H&P (Signed)
[facility] [place] [facility]   PATIENT NAME: Martin Riddle    MR#:  [id number]  DATE OF BIRTH:  1971/04/19  DATE OF ADMISSION:  02/19/2018  PRIMARY CARE PHYSICIAN: Lauro Regulus, MD   REQUESTING/REFERRING PHYSICIAN: dr Lenard Lance  CHIEF COMPLAINT:   weakness HISTORY OF PRESENT ILLNESS:  Martin Riddle  is a 47 y.o. male with a known history of Diabetes, HTN and chronic back pain with a spinal stimulator implanted on chronic narcotics who presents to the emergency room due to decreased responsiveness and generalized weakness.  Over the past week patient has had a upper respiratory infection however the wife reports this had improved up until 2 days ago when patient was feeling more weak and fatigued.  She denies fever chills.  Patient is currently unresponsive and respiratory rate is up to 40.  I have asked the ER physician to intubate the patient.  Wife is in agreement.  I have also spoken with intensivist.  Patient arrived to the ER hypoxic with O2 saturations 80s on 6 L he is currently on nonrebreather.  He does not wear oxygen at home. He has been somnolent.  He was given broad-spectrum antibiotics for chest x-ray showing pneumonia.  He is found to have acute kidney injury with a creatinine of 4 and hyperkalemia.  This is been treated by the ER physician.  PAST MEDICAL HISTORY:   Past Medical History:  Diagnosis Date  . Diabetes mellitus without complication ([facility])   . Hypercholesteremia   . Hypertension   . Pancreatitis     PAST SURGICAL HISTORY:   Past Surgical History:  Procedure Laterality Date  . ANKLE SURGERY Left 2001,2003  . BACK SURGERY  2003,2012    SOCIAL HISTORY:   Social History   Tobacco Use  . Smoking status: Former Smoker    Years: 7.00  . Smokeless tobacco: Current User  Substance Use Topics  . Alcohol use: No    Alcohol/week: 0.0 standard drinks    FAMILY HISTORY:   Family History  Problem Relation Age of Onset   . Heart disease Mother   . Diabetes Mother   . Diabetes Sister     DRUG ALLERGIES:   Allergies  Allergen Reactions  . Morphine And Related Other (See Comments)    headache    REVIEW OF SYSTEMS:   Review of Systems  Unable to perform ROS: Other    MEDICATIONS AT HOME:   Prior to Admission medications   Medication Sig Start Date End Date Taking? Authorizing Provider  aspirin EC 81 MG EC tablet Take 1 tablet (81 mg total) by mouth daily. 12/11/15   Enedina Finner, MD  DULoxetine (CYMBALTA) 30 MG capsule Take 3 capsules (90 mg total) by mouth daily. 01/15/17   Jimmy Footman, MD  gemfibrozil (LOPID) 600 MG tablet Take 1 tablet (600 mg total) by mouth 2 (two) times daily before a meal. 01/14/17   Jimmy Footman, MD  glipiZIDE (GLUCOTROL XL) 10 MG 24 hr tablet Take 1 tablet (10 mg total) by mouth daily with breakfast. 01/15/17   Jimmy Footman, MD  insulin aspart (NOVOLOG) 100 UNIT/ML injection Inject 20 Units into the skin 3 (three) times daily with meals. 01/14/17   Jimmy Footman, MD  insulin detemir (LEVEMIR) 100 UNIT/ML injection Inject 0.35 mLs (35 Units total) into the skin 2 (two) times daily. 01/14/17   Jimmy Footman, MD  metFORMIN (GLUCOPHAGE) 1000 MG tablet Take 1 tablet (1,000 mg total) by mouth 2 (two) times  daily with a meal. 01/14/17   Jimmy FootmanHernandez-Gonzalez, Andrea, MD  methocarbamol (ROBAXIN) 750 MG tablet Take 1 tablet (750 mg total) by mouth every 6 (six) hours as needed for muscle spasms. 01/15/17   Jimmy FootmanHernandez-Gonzalez, Andrea, MD  oxyCODONE 20 MG TABS Take 1 tablet (20 mg total) by mouth 4 (four) times daily -  with meals and at bedtime. 01/15/17   Jimmy FootmanHernandez-Gonzalez, Andrea, MD  polyethylene glycol (MIRALAX / GLYCOLAX) packet Take 17 g by mouth daily. 01/15/17   Jimmy FootmanHernandez-Gonzalez, Andrea, MD  traZODone (DESYREL) 150 MG tablet Take 1 tablet (150 mg total) by mouth at bedtime. 01/14/17   Jimmy FootmanHernandez-Gonzalez, Andrea, MD      VITAL  SIGNS:  Blood pressure (!) 123/93, pulse (!) 102, temperature 97.8 F (36.6 C), temperature source Oral, resp. rate (!) 32, height 6\' 4"  (1.93 m), weight (!) 145.2 kg, SpO2 97 %.  PHYSICAL EXAMINATION:   Physical Exam  Constitutional: No distress.  Obese increased respiratory rate diaphoretic very ill-appearing  HENT:  Head: Normocephalic.  Mouth/Throat: No oropharyngeal exudate.  Oropharynx dry  Eyes: Right eye exhibits no discharge. No scleral icterus.  Sluggish bilateral pupils  Neck: No JVD present. No tracheal deviation present.  Cardiovascular: Regular rhythm and normal heart sounds. Exam reveals no gallop and no friction rub.  No murmur heard. Tachycardic  Pulmonary/Chest: He is in respiratory distress. He has wheezes. He has rales. He exhibits no tenderness.  Abdominal: Soft. Bowel sounds are normal. He exhibits no distension and no mass. There is no tenderness. There is no rebound and no guarding.  Musculoskeletal: He exhibits edema.  Wife reports the right leg is greater than left leg at baseline with edema  Neurological:  Patient is somnolent very lethargic  Skin: No rash noted. No erythema.  Patient is diaphoretic  Psychiatric:  Somnolent      LABORATORY PANEL:   CBC Recent Labs  Lab 02/19/18 1155  WBC 19.2*  HGB 12.9*  HCT 39.2*  PLT 154   ------------------------------------------------------------------------------------------------------------------  Chemistries  Recent Labs  Lab 02/19/18 1155  NA 131*  K 6.6*  CL 96*  CO2 19*  GLUCOSE 153*  BUN 91*  CREATININE 4.16*  CALCIUM 8.2*  AST 23  ALT 21  ALKPHOS 94  BILITOT 1.2   ------------------------------------------------------------------------------------------------------------------  Cardiac Enzymes Recent Labs  Lab 02/19/18 1155  TROPONINI 0.05*   ------------------------------------------------------------------------------------------------------------------  RADIOLOGY:   Dg Chest Portable 1 View  Result Date: 02/19/2018 CLINICAL DATA:  Weakness, altered mental status EXAM: PORTABLE CHEST 1 VIEW COMPARISON:  Chest x-ray dated 01/08/2017. FINDINGS: Ill-defined airspace opacity within the LEFT upper lung, pneumonia versus asymmetric edema. RIGHT lung is clear. No pleural effusion seen. Heart size and mediastinal contours are likely accentuated by patient's semi-erect and slightly oblique positioning. No acute or suspicious osseous finding. IMPRESSION: LEFT upper lobe pneumonia versus asymmetric pulmonary edema. Electronically Signed   By: Bary RichardStan  Maynard M.D.   On: 02/19/2018 11:25    EKG:  Sinus rhythm heart rate 99 no ST elevation or depression  IMPRESSION AND PLAN:   47 year old male with history of hypertension, chronic back pain on chronic narcotics with back stimulator presents to the emergency room with 2 days of generalized weakness and unresponsiveness.  1.  Sepsis with acute hypoxic respiratory failure in the setting of pneumonia: Patient presents with tachypnea, leukocytosis and tachycardia Patient will require intubation Case discussed with the ED physician who will intubate patient in case discussed with intensivist Management as per intensivist  2.  Pneumonia: Cefepime and  vancomycin Follow-up on blood cultures  3.  Hyperkalemia: This was treated in the emergency room.  This is due to acute kidney injury. Repeat potassium later this afternoon  4.  Acute kidney injury in the setting of poor p.o. intake and sepsis Nephrology consultation Monitor intake and output very closely  5.  Hyponatremia from dehydration  6.  Elevated troponin: This is likely due to sepsis With elevation in BNP I will order echocardiogram  7.  Diabetes: Patient will likely need insulin drip as per ICU protocol   Patient is critically ill.  He has multiple organ damage.  This was discussed in length with patient's wife.   All the records are reviewed and case  discussed with ED provider. Management plans discussed with the patient's wife and she is in agreement  CODE STATUS: Full  Critical care TOTAL TIME TAKING CARE OF THIS PATIENT: 70 minutes.    Charistopher Rumble M.D on 02/19/2018 at 1:23 PM  Between 7am to 6pm - Pager - [phone]  After 6pm go to [email or web address] - Social research officer, government  [facility]  Office  [phone]  CC: Primary care physician; Lauro Regulus, MD

## 2018-02-19 NOTE — ED Notes (Signed)
Attempted OG unable to pass, RN Marisue IvanLiz 2 attempts

## 2018-02-19 NOTE — ED Notes (Addendum)
EDP, Sam, Primary RN, Cassie, RN and Marisue IvanLiz, RN, RT, This RN and Pattricia BossAnnie, RN at bedside as EDP prepares to intubate pt.   Pt placed on Zoll pads

## 2018-02-19 NOTE — ED Notes (Signed)
2 of versed verbal [facility]

## 2018-02-19 NOTE — ED Triage Notes (Signed)
Per wife has been extremely weak since yesterday. Patient arrives to me in wheelchair but is nonverbal and drowsy. To room 2 for triage.

## 2018-02-19 NOTE — ED Notes (Signed)
Pt peed all over himself. Pt cleaned up

## 2018-02-19 NOTE — ED Notes (Signed)
Cath unable to stay on patient. No smaller sizes available. Diaper positioned onto patient.

## 2018-02-19 NOTE — Consult Note (Signed)
[facility] CONSULT NOTE    Date: 02/19/2018                  Patient Name:  Martin Riddle  MRN: [id number]  DOB: February 20, 1971  Age / Sex: 47 y.o., male         PCP: Kirk Ruths, MD                 Service Requesting Consult: Critical Care                 Reason for Consult: Acute renal failure, hyperkalemia.             History of Present Illness: Patient is a 47 y.o. male with a PMHx of diabetes mellitus type 2, hypertension, hyperlipidemia, pancreatitis, chronic back pain status post implantable spinal cord stimulator, chronic narcotic use, who was admitted to [facility] on 02/19/2018 for evaluation of decreased level of consciousness and generalized weakness.  Patient apparently had a recent upper respiratory infection.  Thereafter he began feeling generalized weakness as well as fatigue.  He was subsequently brought here for further evaluation.  He was found in respiratory distress in the emergency department and was then intubated.  Patient unable to offer any history at this point time.  He was found to multiple metabolic derangements.  His creatinine was elevated at 4.16 with a BUN of 91.  Potassium was also significantly high at 6.6 and serum sodium was low at 131.  A temporary dialysis catheter has been placed.  We attempted to get in contact with the patient's wife but were unsuccessful.  Therefore emergent consent taken by myself as well as Dr. Jefferson Fuel for initiation of CRRT.   Medications: Outpatient medications: Medications Prior to Admission  Medication Sig Dispense Refill Last Dose  . atorvastatin (LIPITOR) 20 MG tablet Take 20 mg by mouth daily.  3 02/18/2018 at Unknown time  . cetirizine (ZYRTEC) 10 MG tablet Take 10 mg by mouth daily.   Past Week at Unknown time  . DULoxetine (CYMBALTA) 60 MG capsule Take 60 mg by mouth 2 (two) times daily.   02/18/2018 at Unknown time  . gemfibrozil (LOPID) 600 MG tablet Take 1 tablet (600 mg total) by mouth  2 (two) times daily before a meal. 60 tablet 0 02/18/2018 at Unknown time  . glipiZIDE (GLUCOTROL XL) 10 MG 24 hr tablet Take 1 tablet (10 mg total) by mouth daily with breakfast. 30 tablet 0 02/18/2018 at Unknown time  . insulin NPH Human (HUMULIN N,NOVOLIN N) 100 UNIT/ML injection Inject 35 Units into the skin 2 (two) times daily.   02/18/2018 at Unknown time  . insulin regular (NOVOLIN R,HUMULIN R) 100 units/mL injection Inject 18 Units into the skin 3 (three) times daily between meals.   02/18/2018 at Unknown time  . lisinopril-hydrochlorothiazide (PRINZIDE,ZESTORETIC) 20-12.5 MG tablet Take 2 tablets by mouth daily.  5 02/18/2018 at Unknown time  . LORazepam (ATIVAN) 2 MG tablet Take 2 mg by mouth 2 (two) times daily.   02/18/2018 at Unknown time  . metFORMIN (GLUCOPHAGE) 1000 MG tablet Take 1 tablet (1,000 mg total) by mouth 2 (two) times daily with a meal. 60 tablet 0 02/18/2018 at Unknown time  . methocarbamol (ROBAXIN) 750 MG tablet Take 1 tablet (750 mg total) by mouth every 6 (six) hours as needed for muscle spasms. 30 tablet 0 PRN at PRN  . omeprazole (PRILOSEC) 20 MG capsule Take 20 mg by mouth daily.  0 02/18/2018  at Unknown time  . oxycodone (ROXICODONE) 30 MG immediate release tablet Take 30 mg by mouth every 4 (four) hours as needed for pain.  0 PRN at PRN  . polyethylene glycol (MIRALAX / GLYCOLAX) packet Take 17 g by mouth daily. 30 each 0 02/18/2018 at Unknown time  . aspirin EC 81 MG EC tablet Take 1 tablet (81 mg total) by mouth daily. (Patient not taking: Reported on 02/19/2018) 30 tablet 0 Not Taking at Unknown time  . DULoxetine (CYMBALTA) 30 MG capsule Take 3 capsules (90 mg total) by mouth daily. (Patient not taking: Reported on 02/19/2018) 90 capsule 0 Not Taking at Unknown time  . insulin aspart (NOVOLOG) 100 UNIT/ML injection Inject 20 Units into the skin 3 (three) times daily with meals. (Patient not taking: Reported on 02/19/2018) 18 mL 0 Not Taking at Unknown time  . insulin  detemir (LEVEMIR) 100 UNIT/ML injection Inject 0.35 mLs (35 Units total) into the skin 2 (two) times daily. (Patient not taking: Reported on 02/19/2018) 21 mL 0 Not Taking at Unknown time  . oxyCODONE 20 MG TABS Take 1 tablet (20 mg total) by mouth 4 (four) times daily -  with meals and at bedtime. (Patient not taking: Reported on 02/19/2018) 1 tablet 0 Completed Course at Unknown time  . traZODone (DESYREL) 150 MG tablet Take 1 tablet (150 mg total) by mouth at bedtime. (Patient not taking: Reported on 02/19/2018) 30 tablet 0 Not Taking at Unknown time    Current medications: Current Facility-Administered Medications  Medication Dose Route Frequency Provider Last Rate Last Dose  . azithromycin (ZITHROMAX) 500 mg in sodium chloride 0.9 % 250 mL IVPB  500 mg Intravenous Q24H Tawnya Crook, RPH   Stopped at 02/19/18 1318  . ceFEPIme (MAXIPIME) 2 g in sodium chloride 0.9 % 100 mL IVPB  2 g Intravenous Q12H Ellington, Abby K, RPH 200 mL/hr at 02/19/18 1550 2 g at 02/19/18 1550  . dextrose 50 % solution 25 g  25 g Intravenous Once Harvest Dark, MD      . famotidine (PEPCID) IVPB 20 mg premix  20 mg Intravenous Q24H Conforti, John, DO      . fentaNYL (SUBLIMAZE) 100 MCG/2ML injection           . fentaNYL 2587mg in NS 2521m(1021mml) infusion-PREMIX  0-400 mcg/hr Intravenous Continuous PadHarvest DarkD 20 mL/hr at 02/19/18 1419 200 mcg/hr at 02/19/18 1419  . heparin injection 1,000-6,000 Units  1,000-6,000 Units CRRT PRN Etai Copado, MD      . heparin injection 5,000 Units  5,000 Units Subcutaneous Q8H Conforti, John, DO      . insulin aspart (novoLOG) injection 10 Units  10 Units Intravenous Once PadHarvest DarkD      . insulin aspart (novoLOG) injection 2-6 Units  2-6 Units Subcutaneous Q4H Conforti, John, DO      . norepinephrine (LEVOPHED) 16 mg in D5W 250m3memix infusion  0-40 mcg/min Intravenous Titrated Conforti, John, DO 1.88 mL/hr at 02/19/18 1712 2 mcg/min at 02/19/18  1712  . propofol (DIPRIVAN) 1000 MG/100ML infusion  5-80 mcg/kg/min Intravenous Titrated Conforti, John, DO 26.1 mL/hr at 02/19/18 1505 30 mcg/kg/min at 02/19/18 1505  . pureflow IV solution for Dialysis   CRRT Continuous Charlesetta Milliron, MD      . vancomycin (VANCOCIN) 2,000 mg in sodium chloride 0.9 % 500 mL IVPB  2,000 mg Intravenous Once ElliTawnya CrookH 250 mL/hr at 02/19/18 1622 2,000 mg at 02/19/18 1622  . [START  ON 02/20/2018] vancomycin (VANCOCIN) IVPB 1000 mg/200 mL premix  1,000 mg Intravenous Q24H Ellington, Tobe Sos, RPH          Allergies: Allergies  Allergen Reactions  . Morphine And Related Other (See Comments)    headache      Past Medical History: Past Medical History:  Diagnosis Date  . Diabetes mellitus without complication ([place])   . Hypercholesteremia   . Hypertension   . Pancreatitis      Past Surgical History: Past Surgical History:  Procedure Laterality Date  . ANKLE SURGERY Left 2001,2003  . BACK SURGERY  2003,2012     Family History: Family History  Problem Relation Age of Onset  . Heart disease Mother   . Diabetes Mother   . Diabetes Sister      Social History: Social History   Socioeconomic History  . Marital status: Married    Spouse name: Not on file  . Number of children: Not on file  . Years of education: Not on file  . Highest education level: Not on file  Occupational History  . Not on file  Social Needs  . Financial resource strain: Not on file  . Food insecurity:    Worry: Not on file    Inability: Not on file  . Transportation needs:    Medical: Not on file    Non-medical: Not on file  Tobacco Use  . Smoking status: Former Smoker    Years: 7.00  . Smokeless tobacco: Current User  Substance and Sexual Activity  . Alcohol use: No    Alcohol/week: 0.0 standard drinks  . Drug use: No  . Sexual activity: Not on file  Lifestyle  . Physical activity:    Days per week: Not on file    Minutes per session: Not on  file  . Stress: Not on file  Relationships  . Social connections:    Talks on phone: Not on file    Gets together: Not on file    Attends religious service: Not on file    Active member of club or organization: Not on file    Attends meetings of clubs or organizations: Not on file    Relationship status: Not on file  . Intimate partner violence:    Fear of current or ex partner: Not on file    Emotionally abused: Not on file    Physically abused: Not on file    Forced sexual activity: Not on file  Other Topics Concern  . Not on file  Social History Narrative  . Not on file     Review of Systems: Unable to obtain from the patient as he is intubated.  Vital Signs: Blood pressure (!) 155/110, pulse (!) 111, temperature (!) 96.2 F (35.7 C), resp. rate (!) 45, height '6\' 4"'  (1.93 m), weight (!) 145.2 kg, SpO2 94 %.  Weight trends: Filed Weights   02/19/18 1054  Weight: (!) 145.2 kg    Physical Exam: General: Critically ill appearing  Head: Normocephalic, atraumatic, endotracheal tube noted to be in place  Eyes: Anicteric  Nose: Mucous membranes moist, not inflammed, nonerythematous.  Throat: ETT in lace  Neck: Supple, trachea midline.  Lungs:  Bilateral rales and rhonchi, BS vent assisted  Heart: S1S2 tachycardic  Abdomen:  Soft, mild distension, BS present  Extremities: 2+ pretibial edema.  Neurologic: Intubated/sedated  Skin: No visible rashes, scars.    Lab results: Basic Metabolic Panel: Recent Labs  Lab 02/19/18 1155  NA 131*  K 6.6*  CL 96*  CO2 19*  GLUCOSE 153*  BUN 91*  CREATININE 4.16*  CALCIUM 8.2*    Liver Function Tests: Recent Labs  Lab 02/19/18 1155  AST 23  ALT 21  ALKPHOS 94  BILITOT 1.2  PROT 7.8  ALBUMIN 3.4*   No results for input(s): LIPASE, AMYLASE in the last 168 hours. No results for input(s): AMMONIA in the last 168 hours.  CBC: Recent Labs  Lab 02/19/18 1155  WBC 19.2*  NEUTROABS 16.5*  HGB 12.9*  HCT 39.2*   MCV 94.7  PLT 154    Cardiac Enzymes: Recent Labs  Lab 02/19/18 1155  TROPONINI 0.05*    BNP: Invalid input(s): POCBNP  CBG: Recent Labs  Lab 02/19/18 1053 02/19/18 1344 02/19/18 1433  GLUCAP 124* 157* 173*    Microbiology: Results for orders placed or performed during the hospital encounter of 02/19/18  MRSA PCR Screening     Status: None   Collection Time: 02/19/18  2:31 PM  Result Value Ref Range Status   MRSA by PCR NEGATIVE NEGATIVE Final    Comment:        The GeneXpert MRSA Assay (FDA approved for NASAL specimens only), is one component of a comprehensive MRSA colonization surveillance program. It is not intended to diagnose MRSA infection nor to guide or monitor treatment for MRSA infections. Performed at [facility], [address] [place], [place] [address]     Coagulation Studies: No results for input(s): LABPROT, INR in the last 72 hours.  Urinalysis: Recent Labs    02/19/18 1136  COLORURINE YELLOW*  LABSPEC 1.014  PHURINE 5.0  GLUCOSEU NEGATIVE  HGBUR MODERATE*  BILIRUBINUR NEGATIVE  KETONESUR NEGATIVE  PROTEINUR 30*  NITRITE NEGATIVE  LEUKOCYTESUR NEGATIVE      Imaging: Dg Chest Portable 1 View  Result Date: 02/19/2018 CLINICAL DATA:  Post intubation as brought to ER today due to increased unresponsiveness. EXAM: PORTABLE CHEST 1 VIEW COMPARISON:  02/19/2018 at 11:05 a.m. FINDINGS: Patient is rotated to the right. There is been interval placement of an endotracheal tube with tip 6.3 cm above the carina. Lungs are somewhat hypoinflated demonstrate continued hazy opacification over the left upper lung without significant change. Cardiomediastinal silhouette and remainder of the exam is unchanged. IMPRESSION: Stable hazy opacification over the left upper lobe. Endotracheal tube with tip 6.3 cm above the carina. Electronically Signed   By: Marin Olp M.D.   On: 02/19/2018 13:55   Dg Chest Portable 1 View  Result Date:  02/19/2018 CLINICAL DATA:  Weakness, altered mental status EXAM: PORTABLE CHEST 1 VIEW COMPARISON:  Chest x-ray dated 01/08/2017. FINDINGS: Ill-defined airspace opacity within the LEFT upper lung, pneumonia versus asymmetric edema. RIGHT lung is clear. No pleural effusion seen. Heart size and mediastinal contours are likely accentuated by patient's semi-erect and slightly oblique positioning. No acute or suspicious osseous finding. IMPRESSION: LEFT upper lobe pneumonia versus asymmetric pulmonary edema. Electronically Signed   By: Franki Cabot M.D.   On: 02/19/2018 11:25      Assessment & Plan: Pt is a 47 y.o. male with a PMHx of diabetes mellitus type 2, hypertension, hyperlipidemia, pancreatitis, chronic back pain status post implantable spinal cord stimulator, chronic narcotic use, who was admitted to [facility] on 02/19/2018 for evaluation of decreased level of consciousness and generalized weakness.   1.  Acute renal failure, baseline creatinine 1.2 with a EGFR 65.  Acute renal failure suspected secondary to hypotension and acute tubular necrosis. 2.  Hyperkalemia,  potassium 6.6. 3.  Acute respiratory failure. 4.  Hyponatremia, likely hypovolemic. 5.  Acidosis, mixed.  Plan: We are asked to see the patient for acute renal failure, severe hyperkalemia, and acidosis.  He has been intubated and sedated.  We attempted to contact the patient's wife but were unsuccessful in reaching her over an hour to discuss renal placement therapy..  Therefore emergent consent was taken by myself as well as Dr. Jefferson Fuel after conferring on the case.  We will plan to the patient to place the patient on CRRT with blood flow rate of 300, dialysate flow rate of 2.5 L/h, 2K bath, no ultrafiltration initially.  We will follow serum electrolytes closely.  Continue ventilatory support at this time.  We will also check renal ultrasound, SPEP, UPEP, ANA, ANCA antibodies, GBM antibodies, C3, C4 for additional work-up.  Further plan  as patient progresses.  Thanks for consultation.

## 2018-02-19 NOTE — ED Notes (Signed)
Pt had very large foul smelling bowel movement, soft and brown. Sheets changed, pt cleaned with wipes, new gown applied, pts feet cleaned from what appeared to be old dried stool and dirt-potentially a few days old. Pt tolerated well. RR remain elevated in the upper 30's to low 40's, dr Tyrone ApplePaduchowsk aware, order obtained.

## 2018-02-19 NOTE — ED Notes (Signed)
Pt given bolus of propofol by RN Wilford Sportsassie, verbal order [facility]

## 2018-02-19 NOTE — ED Notes (Signed)
MD Paduchowski informed of pt's critical test results of Potassium and Troponin.

## 2018-02-19 NOTE — ED Notes (Signed)
Pt had large runny bowel movement. Pt cleaned up and bed changed. Pt repositioned and set up in bed.

## 2018-02-20 ENCOUNTER — Inpatient Hospital Stay (HOSPITAL_COMMUNITY)
Admit: 2018-02-20 | Discharge: 2018-02-20 | Disposition: A | Discharge status: Home / Self Care | Payer: Medicare Other | Attending: Internal Medicine | Admitting: Internal Medicine

## 2018-02-20 ENCOUNTER — Inpatient Hospital Stay: Payer: Medicare Other

## 2018-02-20 DIAGNOSIS — J9690 Respiratory failure, unspecified, unspecified whether with hypoxia or hypercapnia: Secondary | ICD-10-CM

## 2018-02-20 DIAGNOSIS — J9601 Acute respiratory failure with hypoxia: Secondary | ICD-10-CM

## 2018-02-20 LAB — RENAL FUNCTION PANEL
ALBUMIN: 3.2 g/dL — AB (ref 3.5–5.0)
Albumin: 3 g/dL — ABNORMAL LOW (ref 3.5–5.0)
Anion gap: 9 (ref 5–15)
Anion gap: 7 (ref 5–15)
BUN: 75 mg/dL — AB (ref 6–20)
BUN: 58 mg/dL — AB (ref 6–20)
CHLORIDE: 104 mmol/L (ref 98–111)
CO2: 23 mmol/L (ref 22–32)
CO2: 28 mmol/L (ref 22–32)
CREATININE: 2.32 mg/dL — AB (ref 0.61–1.24)
CREATININE: 1.48 mg/dL — AB (ref 0.61–1.24)
Calcium: 8.3 mg/dL — ABNORMAL LOW (ref 8.9–10.3)
Calcium: 8.7 mg/dL — ABNORMAL LOW (ref 8.9–10.3)
Chloride: 104 mmol/L (ref 98–111)
GFR calc Af Amer: >60 mL/min (ref >60)
GFR calc non Af Amer: 54 mL/min — ABNORMAL LOW (ref >60)
GFR, EST AFRICAN AMERICAN: 37 mL/min — AB (ref >60)
GFR, EST NON AFRICAN AMERICAN: 32 mL/min — AB (ref >60)
Glucose, Bld: 165 mg/dL — ABNORMAL HIGH (ref 70–99)
Glucose, Bld: 136 mg/dL — ABNORMAL HIGH (ref 70–99)
POTASSIUM: 4.8 mmol/L (ref 3.5–5.1)
Phosphorus: 5 mg/dL — ABNORMAL HIGH (ref 2.5–4.6)
Phosphorus: 3.6 mg/dL (ref 2.5–4.6)
Potassium: 4.7 mmol/L (ref 3.5–5.1)
Sodium: 136 mmol/L (ref 135–145)
Sodium: 139 mmol/L (ref 135–145)

## 2018-02-20 LAB — ECHOCARDIOGRAM COMPLETE
Height: 76 in
WEIGHTICAEL: 5104.09 [oz_av]

## 2018-02-20 LAB — GLUCOSE, CAPILLARY
GLUCOSE-CAPILLARY: 155 mg/dL — AB (ref 70–99)
Glucose-Capillary: 120 mg/dL — ABNORMAL HIGH (ref 70–99)
Glucose-Capillary: 138 mg/dL — ABNORMAL HIGH (ref 70–99)
Glucose-Capillary: 157 mg/dL — ABNORMAL HIGH (ref 70–99)
Glucose-Capillary: 143 mg/dL — ABNORMAL HIGH (ref 70–99)

## 2018-02-20 LAB — MAGNESIUM
MAGNESIUM: 2.1 mg/dL (ref 1.7–2.4)
Magnesium: 2.2 mg/dL (ref 1.7–2.4)

## 2018-02-20 LAB — URINE CULTURE: Culture: NO GROWTH

## 2018-02-20 LAB — TROPONIN I: TROPONIN I: 0.08 ng/mL — AB (ref <0.03)

## 2018-02-20 LAB — PROCALCITONIN: PROCALCITONIN: 10.58 ng/mL

## 2018-02-20 MED ORDER — ORAL CARE MOUTH RINSE
15.0000 mL | OROMUCOSAL | Status: DC
  Administered 2018-02-20 – 2018-02-26 (×58): 15 mL via OROMUCOSAL

## 2018-02-20 MED ORDER — CHLORHEXIDINE GLUCONATE 0.12% ORAL RINSE (MEDLINE KIT)
15.0000 mL | Freq: Two times a day (BID) | OROMUCOSAL | Status: DC
  Administered 2018-02-20 – 2018-02-26 (×13): 15 mL via OROMUCOSAL

## 2018-02-20 MED ORDER — SODIUM CHLORIDE 0.9 % IV SOLN
0.0000 mg/h | INTRAVENOUS | Status: DC
  Administered 2018-02-20: 5 mg/h via INTRAVENOUS
  Administered 2018-02-20: 1 mg/h via INTRAVENOUS
  Administered 2018-02-21 – 2018-02-23 (×8): 5 mg/h via INTRAVENOUS
  Administered 2018-02-24: 3 mg/h via INTRAVENOUS
  Administered 2018-02-25: 7 mg/h via INTRAVENOUS
  Administered 2018-02-25: 5 mg/h via INTRAVENOUS
  Administered 2018-02-25: 3 mg/h via INTRAVENOUS
  Administered 2018-02-26: 7 mg/h via INTRAVENOUS
  Filled 2018-02-20 (×17): qty 10

## 2018-02-20 MED ORDER — ACETAMINOPHEN 650 MG RE SUPP
650.0000 mg | RECTAL | Status: DC | PRN
  Administered 2018-02-20 – 2018-02-21 (×4): 650 mg via RECTAL
  Filled 2018-02-20 (×3): qty 1

## 2018-02-20 MED ORDER — PERFLUTREN LIPID MICROSPHERE
1.0000 mL | INTRAVENOUS | Status: AC | PRN
  Administered 2018-02-20: 2.5 mL via INTRAVENOUS
  Filled 2018-02-20: qty 10

## 2018-02-20 NOTE — Progress Notes (Signed)
*  PRELIMINARY RESULTS* Echocardiogram 2D Echocardiogram has been performed. Definity IV Contrast used on this study.  Martin Riddle Dec 02/20/2018, 3:00 PM

## 2018-02-20 NOTE — Progress Notes (Signed)
Follow up - Critical Care Medicine Note  Patient Details:    Martin Riddle is an 47 y.o. male. with a past medical history remarkable for hypertension, hyperlipidemia, diabetes mellitus, pancreatitis, chronic back pain with implantable spinal stimulator and chronic narcotic use was brought into the emergency department with decreased responsiveness and generalized weakness. Over the past week he has had an upper respiratory tract infection although his wife reports that he had improved until 2 days ago. Patient began feeling more weak and fatigued. She denied that he had any fever or chills. In emergency department patient became more somnolent with increased work of breathing and respiratory distress and was subsequently intubated. Pertinent labs reveal sodium 131, potassium 6.6, BUN 91, creatinine 4.16, troponin 0.05, anion gap of 16, leukocytosis of 19, anemia at hemoglobin 12.9. He was subsequently brought to the intensive care unit, started on CRRT  Lines, Airways, Drains: Airway 8 mm (Active)  Secured at (cm) 25 cm 02/20/2018  8:10 AM  Measured From Lips 02/20/2018  8:10 AM  Secured Location Center 02/20/2018  8:10 AM  Secured By [employer] 02/20/2018  8:10 AM  Tube Holder Repositioned Yes 02/20/2018  8:10 AM  Cuff Pressure (cm H2O) 26 cm H2O 02/20/2018  8:10 AM  Site Condition Dry 02/20/2018  8:10 AM     NG/OG Tube Orogastric 16 Fr. Center mouth Xray Documented cm marking at nare/ corner of mouth 70 cm (Active)  Cm Marking at Nare/Corner of Mouth (if applicable) 70 cm 02/20/2018  8:00 AM  Site Assessment Clean;Dry;Intact 02/20/2018  8:00 AM  Ongoing Placement Verification No change in cm markings or external length of tube from initial placement 02/20/2018  8:00 AM  Status Suction-low intermittent 02/20/2018  8:00 AM  Drainage Appearance Bile 02/20/2018  8:00 AM  Output (mL) 0 mL 02/20/2018  5:54 AM     Rectal Tube/Pouch (Active)  Output (mL) 0 mL 02/20/2018  5:54 AM     Urethral  Catheter Sam RN Double-lumen;Non-latex;Temperature probe 14 Fr. (Active)  Indication for Insertion or Continuance of Catheter Unstable critical patients (first 24-48 hours) 02/20/2018  8:00 AM  Site Assessment Clean;Intact 02/20/2018  8:00 AM  Catheter Maintenance Bag below level of bladder;Catheter secured;Drainage bag/tubing not touching floor;Insertion date on drainage bag;No dependent loops;Seal intact;Bag emptied prior to transport 02/20/2018  8:00 AM  Collection Container Standard drainage bag 02/20/2018  8:00 AM  Securement Method Securing device (Describe) 02/20/2018  8:00 AM  Urinary Catheter Interventions Unclamped 02/20/2018  8:00 AM  Output (mL) 300 mL 02/20/2018  5:54 AM    Anti-infectives:  Anti-infectives (From admission, onward)   Start     Dose/Rate Route Frequency Ordered Stop   02/20/18 1200  vancomycin (VANCOCIN) IVPB 1000 mg/200 mL premix     1,000 mg 200 mL/hr over 60 Minutes Intravenous Every 24 hours 02/19/18 1523     02/19/18 1600  ceFEPIme (MAXIPIME) 2 g in sodium chloride 0.9 % 100 mL IVPB     2 g 200 mL/hr over 30 Minutes Intravenous Every 12 hours 02/19/18 1509     02/19/18 1530  vancomycin (VANCOCIN) 2,000 mg in sodium chloride 0.9 % 500 mL IVPB     2,000 mg 250 mL/hr over 120 Minutes Intravenous  Once 02/19/18 1509 02/19/18 1823   02/19/18 1400  vancomycin (VANCOCIN) IVPB 1000 mg/200 mL premix  Status:  Discontinued     1,000 mg 200 mL/hr over 60 Minutes Intravenous  Once 02/19/18 1353 02/19/18 1509   02/19/18 1145  cefTRIAXone (ROCEPHIN) 2 g in sodium chloride 0.9 % 100 mL IVPB  Status:  Discontinued     2 g 200 mL/hr over 30 Minutes Intravenous Every 24 hours 02/19/18 1143 02/19/18 1509   02/19/18 1145  azithromycin (ZITHROMAX) 500 mg in sodium chloride 0.9 % 250 mL IVPB     500 mg 250 mL/hr over 60 Minutes Intravenous Every 24 hours 02/19/18 1143 02/23/18 2359      Microbiology: Results for orders placed or performed during the hospital encounter of  02/19/18  Blood culture (routine x 2)     Status: None (Preliminary result)   Collection Time: 02/19/18 11:02 AM  Result Value Ref Range Status   Specimen Description BLOOD LEFT ANTECUBITAL  Final   Special Requests   Final    BOTTLES DRAWN AEROBIC AND ANAEROBIC Blood Culture results may not be optimal due to an inadequate volume of blood received in culture bottles   Culture   Final    NO GROWTH < 24 HOURS Performed at [facility], [address] [place], [place] [address]    Report Status PENDING  Incomplete  Blood culture (routine x 2)     Status: None (Preliminary result)   Collection Time: 02/19/18 11:55 AM  Result Value Ref Range Status   Specimen Description BLOOD RIGHT ANTECUBITAL  Final   Special Requests   Final    BOTTLES DRAWN AEROBIC AND ANAEROBIC Blood Culture adequate volume   Culture   Final    NO GROWTH < 24 HOURS Performed at [facility], [address], [place], [place] [address]    Report Status PENDING  Incomplete  MRSA PCR Screening     Status: None   Collection Time: 02/19/18  2:31 PM  Result Value Ref Range Status   MRSA by PCR NEGATIVE NEGATIVE Final    Comment:        The GeneXpert MRSA Assay (FDA approved for NASAL specimens only), is one component of a comprehensive MRSA colonization surveillance program. It is not intended to diagnose MRSA infection nor to guide or monitor treatment for MRSA infections. Performed at [facility], [address] [place], [place] [address]     Studies: Dg Abd 1 View  Result Date: 02/19/2018 CLINICAL DATA:  Orogastric tube placement EXAM: ABDOMEN - 1 VIEW COMPARISON:  None. FINDINGS: Tip of the orogastric tube is in the body of the stomach. Multiple linear devices project over the abdomen. A spinal stimulator is in place in the lower thoracic spine. There are no disproportionally dilated loops of bowel. Lower lumbar spinal stabilization hardware is in place. IMPRESSION: Orogastric  tube tip is in the body of the stomach Nonobstructive bowel gas pattern. Electronically Signed   By: Jolaine Click M.D.   On: 02/19/2018 20:55   Dg Chest Portable 1 View  Result Date: 02/19/2018 CLINICAL DATA:  Post intubation as brought to ER today due to increased unresponsiveness. EXAM: PORTABLE CHEST 1 VIEW COMPARISON:  02/19/2018 at 11:05 a.m. FINDINGS: Patient is rotated to the right. There is been interval placement of an endotracheal tube with tip 6.3 cm above the carina. Lungs are somewhat hypoinflated demonstrate continued hazy opacification over the left upper lung without significant change. Cardiomediastinal silhouette and remainder of the exam is unchanged. IMPRESSION: Stable hazy opacification over the left upper lobe. Endotracheal tube with tip 6.3 cm above the carina. Electronically Signed   By: Elberta Fortis M.D.   On: 02/19/2018 13:55   Dg Chest Portable 1  View  Result Date: 02/19/2018 CLINICAL DATA:  Weakness, altered mental status EXAM: PORTABLE CHEST 1 VIEW COMPARISON:  Chest x-ray dated 01/08/2017. FINDINGS: Ill-defined airspace opacity within the LEFT upper lung, pneumonia versus asymmetric edema. RIGHT lung is clear. No pleural effusion seen. Heart size and mediastinal contours are likely accentuated by patient's semi-erect and slightly oblique positioning. No acute or suspicious osseous finding. IMPRESSION: LEFT upper lobe pneumonia versus asymmetric pulmonary edema. Electronically Signed   By: Bary RichardStan  Maynard M.D.   On: 02/19/2018 11:25    Consults: Treatment Team:  [facility], MD Tora Kindredonforti, Lawerance Matsuo, DO   Subjective:    Overnight Issues: overnight he has required some pressor support on CRRT. No other issues noted  Objective:  Vital signs for last 24 hours: Temp:  [96.2 F (35.7 C)-99.3 F (37.4 C)] 97.9 F (36.6 C) (09/14 0800) Pulse Rate:  [52-122] 85 (09/14 0800) Resp:  [6-75] 21 (09/14 0800) BP: (54-228)/(33-110) 111/67 (09/14 0800) SpO2:  [80 %-100 %] 99  % (09/14 0810) FiO2 (%):  [60 %-100 %] 60 % (09/14 0810) Weight:  [144.7 kg-145.2 kg] 144.7 kg (09/14 0322)  Hemodynamic parameters for last 24 hours:    Intake/Output from previous day: 09/13 0701 - 09/14 0700 In: 2920.9 [I.V.:305.7; IV Piggyback:2615.2] Out: 4525 [Urine:4525]  Intake/Output this shift: Total I/O In: 1123 [I.V.:992.5; IV Piggyback:130.5] Out: 0   Vent settings for last 24 hours: Vent Mode: PRVC FiO2 (%):  [60 %-100 %] 60 % Set Rate:  [20 bmp] 20 bmp Vt Set:  [550 mL] 550 mL PEEP:  [5 cmH20-10 cmH20] 7 cmH20 Plateau Pressure:  [16 cmH20-17 cmH20] 17 cmH20  Physical Exam:   Vital signs:       Please see the above listed vital signs HEENT:           Patient is intubated, oral endotracheal tube and oral gastric tube in place, face is somewhat flushed, trachea midline, no thyromegaly appreciated, no jugular venous distention is noted Cardiovascular:           Tachycardia appreciated sinus mechanism Pulmonary:      Coarse rhonchi appreciated left greater than right Abdominal:      Positive bowel sounds, soft exam Extremities:     Multiple excoriations noted bilateral lower extremity, chronic discoloration from peripheral vascular disease, 1+ edema noted Neurologic:      Patient moves all extremities, intubated on sedation   Assessment/Plan:   Respiratory failure. Patient required intubation and placed on mechanical ventilation. Left upper lobe pneumonia noted on chest x-ray. Started on vancomycin and cefepime with blood and sputum cultures obtained  Sepsis Shock. n norepinephrine for pressor support  Renal failure. elevated BUN/creatinine most likely secondary to ATN from sepsis. Was started on CRRT for acute renal failure in the setting of hypotension and hyperkalemia. This morning's BMP shows a sodium of 136, potassium 4.7, BUN 75, creatinine 2.32 and a CO2 of 23  Elevated troponin. EKG did not reveal any acute ST segment changes. Troponin this morning is  0.08  Diabetes. ICU protocol  Critical care time 40 minutes   Aidenn Skellenger 02/20/2018  *Care during the described time interval was provided by me and/or other providers on the critical care team.  I have reviewed this patient's available data, including medical history, events of note, physical examination and test results as part of my evaluation.

## 2018-02-20 NOTE — Progress Notes (Signed)
[facility] [place] [facility]  ROUNDING NOTE   Subjective:  We were asked to see the patient yesterday for acute renal failure and severe hyperkalemia. Significant improvement noted in renal function as urine output was 4.5 L over the preceding 24 hours. Therefore we instructed nursing to discontinue CRRT at this time. Blood pressure remains low the same at 107/58. Patient remains critically ill and is on the ventilator.  Objective:  Vital signs in last 24 hours:  Temp:  [96.2 F (35.7 C)-99.3 F (37.4 C)] 97.7 F (36.5 C) (09/14 0900) Pulse Rate:  [52-122] 80 (09/14 0900) Resp:  [6-75] 20 (09/14 0900) BP: (54-228)/(33-110) 107/58 (09/14 0900) SpO2:  [80 %-100 %] 97 % (09/14 0900) FiO2 (%):  [60 %-100 %] 60 % (09/14 0810) Weight:  [144.7 kg-145.2 kg] 144.7 kg (09/14 0322)  Weight change:  Filed Weights   02/19/18 1054 02/20/18 0322  Weight: (!) 145.2 kg (!) 144.7 kg    Intake/Output: I/O last 3 completed shifts: In: 2920.9 [I.V.:305.7; IV Piggyback:2615.2] Out: 9622 [Urine:4525]   Intake/Output this shift:  Total I/O In: 1123 [I.V.:992.5; IV Piggyback:130.5] Out: 0   Physical Exam: General: Critically ill appearing  Head: Normocephalic, atraumatic. ETT in place  Eyes: Anicteric  Neck: Supple, trachea midline  Lungs:  Scattered rhonchi, vent assisted  Heart: S1S2 no rubs  Abdomen:  Soft, nontender, bowel sounds present  Extremities: 2+ peripheral edema.  Neurologic: Intubated/sedated  Skin: No lesions  Access: Right femoral dialysis catheter    Basic Metabolic Panel: Recent Labs  Lab 02/19/18 1642 02/19/18 1837 02/19/18 2107 02/20/18 0213 02/20/18 0831  NA 131*  132* 133* 134* 136 139  K 6.6*  6.6* 5.5* 5.1 4.7 4.8  CL 99  100 101 102 104 104  CO2 21*  21* 21* 19* 23 28  GLUCOSE 170*  176* 158* 172* 165* 136*  BUN 92*  97* 91* 82* 75* 58*  CREATININE 4.06*  3.97* 3.66* 3.14* 2.32* 1.48*  CALCIUM 7.6*  7.8* 7.7* 7.9* 8.3* 8.7*  MG 1.9 2.0 2.1 2.1  2.2  PHOS 7.9* 7.3* 6.3* 5.0* 3.6    Liver Function Tests: Recent Labs  Lab 02/19/18 1155 02/19/18 1642 02/19/18 1837 02/19/18 2107 02/20/18 0213 02/20/18 0831  AST 23  --   --   --   --   --   ALT 21  --   --   --   --   --   ALKPHOS 94  --   --   --   --   --   BILITOT 1.2  --   --   --   --   --   PROT 7.8  --   --   --   --   --   ALBUMIN 3.4* 2.9* 3.2* 3.1* 3.2* 3.0*   No results for input(s): LIPASE, AMYLASE in the last 168 hours. No results for input(s): AMMONIA in the last 168 hours.  CBC: Recent Labs  Lab 02/19/18 1155  WBC 19.2*  NEUTROABS 16.5*  HGB 12.9*  HCT 39.2*  MCV 94.7  PLT 154    Cardiac Enzymes: Recent Labs  Lab 02/19/18 1155 02/19/18 1642 02/19/18 2107 02/20/18 0213  TROPONINI 0.05* 0.12* 0.09* 0.08*    BNP: Invalid input(s): POCBNP  CBG: Recent Labs  Lab 02/19/18 1807 02/19/18 1942 02/19/18 2329 02/20/18 0306 02/20/18 0728  GLUCAP 135* 141* 155* 155* 120*    Microbiology: Results for orders placed or performed during the hospital encounter of 02/19/18  Blood  culture (routine x 2)     Status: None (Preliminary result)   Collection Time: 02/19/18 11:02 AM  Result Value Ref Range Status   Specimen Description BLOOD LEFT ANTECUBITAL  Final   Special Requests   Final    BOTTLES DRAWN AEROBIC AND ANAEROBIC Blood Culture results may not be optimal due to an inadequate volume of blood received in culture bottles   Culture   Final    NO GROWTH < 24 HOURS Performed at [facility], [address] [place], [place] [address]    Report Status PENDING  Incomplete  Blood culture (routine x 2)     Status: None (Preliminary result)   Collection Time: 02/19/18 11:55 AM  Result Value Ref Range Status   Specimen Description BLOOD RIGHT ANTECUBITAL  Final   Special Requests   Final    BOTTLES DRAWN AEROBIC AND ANAEROBIC Blood Culture adequate volume   Culture   Final    NO GROWTH < 24 HOURS Performed at [facility],  [address] [place], [place] [address]    Report Status PENDING  Incomplete  MRSA PCR Screening     Status: None   Collection Time: 02/19/18  2:31 PM  Result Value Ref Range Status   MRSA by PCR NEGATIVE NEGATIVE Final    Comment:        The GeneXpert MRSA Assay (FDA approved for NASAL specimens only), is one component of a comprehensive MRSA colonization surveillance program. It is not intended to diagnose MRSA infection nor to guide or monitor treatment for MRSA infections. Performed at [facility], [address] [place], [place] [address]     Coagulation Studies: No results for input(s): LABPROT, INR in the last 72 hours.  Urinalysis: Recent Labs    02/19/18 1136  COLORURINE YELLOW*  LABSPEC 1.014  PHURINE 5.0  GLUCOSEU NEGATIVE  HGBUR MODERATE*  BILIRUBINUR NEGATIVE  KETONESUR NEGATIVE  PROTEINUR 30*  NITRITE NEGATIVE  LEUKOCYTESUR NEGATIVE      Imaging: Dg Abd 1 View  Result Date: 02/19/2018 CLINICAL DATA:  Orogastric tube placement EXAM: ABDOMEN - 1 VIEW COMPARISON:  None. FINDINGS: Tip of the orogastric tube is in the body of the stomach. Multiple linear devices project over the abdomen. A spinal stimulator is in place in the lower thoracic spine. There are no disproportionally dilated loops of bowel. Lower lumbar spinal stabilization hardware is in place. IMPRESSION: Orogastric tube tip is in the body of the stomach Nonobstructive bowel gas pattern. Electronically Signed   By: Marybelle Killings M.D.   On: 02/19/2018 20:55   Dg Chest Portable 1 View  Result Date: 02/19/2018 CLINICAL DATA:  Post intubation as brought to ER today due to increased unresponsiveness. EXAM: PORTABLE CHEST 1 VIEW COMPARISON:  02/19/2018 at 11:05 a.m. FINDINGS: Patient is rotated to the right. There is been interval placement of an endotracheal tube with tip 6.3 cm above the carina. Lungs are somewhat hypoinflated demonstrate continued hazy opacification over the left upper  lung without significant change. Cardiomediastinal silhouette and remainder of the exam is unchanged. IMPRESSION: Stable hazy opacification over the left upper lobe. Endotracheal tube with tip 6.3 cm above the carina. Electronically Signed   By: Marin Olp M.D.   On: 02/19/2018 13:55   Dg Chest Portable 1 View  Result Date: 02/19/2018 CLINICAL DATA:  Weakness, altered mental status EXAM: PORTABLE CHEST 1 VIEW COMPARISON:  Chest x-ray dated 01/08/2017. FINDINGS: Ill-defined airspace opacity within the LEFT upper lung, pneumonia versus  asymmetric edema. RIGHT lung is clear. No pleural effusion seen. Heart size and mediastinal contours are likely accentuated by patient's semi-erect and slightly oblique positioning. No acute or suspicious osseous finding. IMPRESSION: LEFT upper lobe pneumonia versus asymmetric pulmonary edema. Electronically Signed   By: Franki Cabot M.D.   On: 02/19/2018 11:25     Medications:   . azithromycin Stopped (02/19/18 1318)  . ceFEPime (MAXIPIME) IV Stopped (02/20/18 0340)  . famotidine (PEPCID) IV Stopped (02/19/18 2150)  . fentaNYL infusion INTRAVENOUS 400 mcg/hr (02/20/18 0951)  . norepinephrine (LEVOPHED) Adult infusion 6 mcg/min (02/20/18 0800)  . propofol (DIPRIVAN) infusion 15 mcg/kg/min (02/20/18 0800)  . pureflow 2,500 mL/hr at 02/20/18 0615  . vancomycin     . chlorhexidine gluconate (MEDLINE KIT)  15 mL Mouth Rinse BID  . dextrose  25 g Intravenous Once  . heparin injection (subcutaneous)  5,000 Units Subcutaneous Q8H  . insulin aspart  10 Units Intravenous Once  . insulin aspart  2-6 Units Subcutaneous Q4H  . mouth rinse  15 mL Mouth Rinse 10 times per day   heparin, midazolam  Assessment/ Plan:  47 y.o. male with a PMHx of diabetes mellitus type 2, hypertension, hyperlipidemia, pancreatitis, chronic back pain status post implantable spinal cord stimulator, chronic narcotic use, who was admitted to [facility] on 02/19/2018 for evaluation of decreased  level of consciousness and generalized weakness.   1.  Acute renal failure, baseline creatinine 1.2 with a EGFR 65.  Acute renal failure suspected secondary to hypotension and acute tubular necrosis. 2.  Hyperkalemia 3.  Acute respiratory failure. 4.  Hyponatremia, likely hypovolemic. 5.  Acidosis, mixed.  Plan: Patient seen and evaluated at bedside.  He remains critically ill at the moment as he is still on the ventilator.  However his acute renal failure has significantly improved with urine output of 4.5 L over the preceding 24 hours.  Hyperkalemia has also resolved at the moment.  Therefore we will go ahead and discontinue CRRT at this time.  We defer management of the trialysis temporary dialysis catheter to pulmonary/critical care as it may be needed for additional vasoactive drips.  We will continue to monitor the patient's progress closely.   LOS: 1 Martin Riddle 9/14/201910:11 AM

## 2018-02-21 ENCOUNTER — Inpatient Hospital Stay: Payer: Medicare Other

## 2018-02-21 LAB — BASIC METABOLIC PANEL
Anion gap: 6 (ref 5–15)
BUN: 38 mg/dL — AB (ref 6–20)
CHLORIDE: 103 mmol/L (ref 98–111)
CO2: 32 mmol/L (ref 22–32)
Calcium: 9 mg/dL (ref 8.9–10.3)
Creatinine, Ser: 1.1 mg/dL (ref 0.61–1.24)
GFR calc Af Amer: >60 mL/min (ref >60)
GFR calc non Af Amer: >60 mL/min (ref >60)
GLUCOSE: 149 mg/dL — AB (ref 70–99)
POTASSIUM: 5.3 mmol/L — AB (ref 3.5–5.1)
Sodium: 141 mmol/L (ref 135–145)

## 2018-02-21 LAB — PROCALCITONIN: PROCALCITONIN: 4.33 ng/mL

## 2018-02-21 LAB — C3 COMPLEMENT: C3 Complement: 175 mg/dL — ABNORMAL HIGH (ref 82–167)

## 2018-02-21 LAB — GLUCOSE, CAPILLARY
GLUCOSE-CAPILLARY: 139 mg/dL — AB (ref 70–99)
GLUCOSE-CAPILLARY: 140 mg/dL — AB (ref 70–99)
GLUCOSE-CAPILLARY: 151 mg/dL — AB (ref 70–99)
GLUCOSE-CAPILLARY: 165 mg/dL — AB (ref 70–99)
Glucose-Capillary: 168 mg/dL — ABNORMAL HIGH (ref 70–99)
Glucose-Capillary: 164 mg/dL — ABNORMAL HIGH (ref 70–99)
Glucose-Capillary: 137 mg/dL — ABNORMAL HIGH (ref 70–99)

## 2018-02-21 LAB — POTASSIUM: Potassium: 5.7 mmol/L — ABNORMAL HIGH (ref 3.5–5.1)

## 2018-02-21 LAB — C4 COMPLEMENT: Complement C4, Body Fluid: 41 mg/dL (ref 14–44)

## 2018-02-21 MED ORDER — FENTANYL BOLUS VIA INFUSION
100.0000 ug | Freq: Once | INTRAVENOUS | Status: AC
  Administered 2018-02-21: 100 ug via INTRAVENOUS
  Filled 2018-02-21: qty 100

## 2018-02-21 MED ORDER — SODIUM POLYSTYRENE SULFONATE 15 GM/60ML PO SUSP
30.0000 g | Freq: Once | ORAL | Status: AC
  Administered 2018-02-21: 30 g
  Filled 2018-02-21: qty 120

## 2018-02-21 MED ORDER — IPRATROPIUM-ALBUTEROL 0.5-2.5 (3) MG/3ML IN SOLN
RESPIRATORY_TRACT | Status: AC
  Administered 2018-02-21: 3 mL
  Filled 2018-02-21: qty 3

## 2018-02-21 MED ORDER — MIDAZOLAM BOLUS VIA INFUSION
4.0000 mg | Freq: Once | INTRAVENOUS | Status: AC
  Administered 2018-02-21: 4 mg via INTRAVENOUS
  Filled 2018-02-21: qty 4

## 2018-02-21 MED ORDER — DEXMEDETOMIDINE HCL IN NACL 400 MCG/100ML IV SOLN
0.4000 ug/kg/h | INTRAVENOUS | Status: DC
  Administered 2018-02-21 (×2): 1.2 ug/kg/h via INTRAVENOUS
  Administered 2018-02-21 (×2): 1 ug/kg/h via INTRAVENOUS
  Administered 2018-02-21: 1.2 ug/kg/h via INTRAVENOUS
  Administered 2018-02-21: 1 ug/kg/h via INTRAVENOUS
  Administered 2018-02-22 (×4): 1.4 ug/kg/h via INTRAVENOUS
  Administered 2018-02-22: 1 ug/kg/h via INTRAVENOUS
  Administered 2018-02-22 (×3): 1.4 ug/kg/h via INTRAVENOUS
  Administered 2018-02-22: 0.563 ug/kg/h via INTRAVENOUS
  Administered 2018-02-22 – 2018-02-23 (×6): 1.4 ug/kg/h via INTRAVENOUS
  Filled 2018-02-21 (×21): qty 100

## 2018-02-21 MED ORDER — IBUPROFEN 100 MG/5ML PO SUSP
400.0000 mg | Freq: Four times a day (QID) | ORAL | Status: DC | PRN
  Administered 2018-02-21 – 2018-02-23 (×3): 400 mg via ORAL
  Filled 2018-02-21 (×5): qty 20

## 2018-02-21 MED ORDER — VANCOMYCIN HCL 10 G IV SOLR
1750.0000 mg | Freq: Three times a day (TID) | INTRAVENOUS | Status: DC
  Administered 2018-02-21 – 2018-02-22 (×3): 1750 mg via INTRAVENOUS
  Filled 2018-02-21 (×5): qty 1750

## 2018-02-21 MED ORDER — IPRATROPIUM-ALBUTEROL 0.5-2.5 (3) MG/3ML IN SOLN
3.0000 mL | Freq: Four times a day (QID) | RESPIRATORY_TRACT | Status: DC
  Administered 2018-02-22 (×2): 3 mL via RESPIRATORY_TRACT
  Filled 2018-02-21 (×2): qty 3

## 2018-02-21 NOTE — Progress Notes (Signed)
[employer] -  at [facility]   PATIENT NAME: Martin Riddle    MR#:  [id number]  DATE OF BIRTH:  1970/10/16  SUBJECTIVE:  CHIEF COMPLAINT:   Chief Complaint  Patient presents with  . Weakness   Brought with altered mental status and noted to have respiratory failure and intubated.  Septic shock with multiorgan failure and required dialysis initially.  Blood pressure stable. Renal function improved, have good urine output.  REVIEW OF SYSTEMS:   Patient is intubated and sedated.  ROS  DRUG ALLERGIES:   Allergies  Allergen Reactions  . Morphine And Related Other (See Comments)    headache    VITALS:  Blood pressure (!) 111/50, pulse 84, temperature (!) 101.3 F (38.5 C), resp. rate (!) 32, height 6\' 4"  (1.93 m), weight (!) 142.1 kg, SpO2 92 %.  PHYSICAL EXAMINATION:  GENERAL:  47 y.o.-year-old patient lying in the bed with no acute distress.  EYES: Pupils equal, round, reactive to light and accommodation. No scleral icterus. Extraocular muscles intact.  HEENT: Head atraumatic, normocephalic. Oropharynx and nasopharynx clear.  NECK:  Supple, no jugular venous distention. No thyroid enlargement, no tenderness.  LUNGS: Normal breath sounds bilaterally, no wheezing, rales,rhonchi or crepitation. No use of accessory muscles of respiration.  CARDIOVASCULAR: S1, S2 normal. No murmurs, rubs, or gallops.  ABDOMEN: Soft, nontender, nondistended. Bowel sounds present. No organomegaly or mass.  EXTREMITIES: No pedal edema, cyanosis, or clubbing.  NEUROLOGIC: Patient is sedated and intubated currently. PSYCHIATRIC: Sedated and intubated.  SKIN: No obvious rash, lesion, or ulcer.   Physical Exam LABORATORY PANEL:   CBC Recent Labs  Lab 02/19/18 1155  WBC 19.2*  HGB 12.9*  HCT 39.2*  PLT 154   ------------------------------------------------------------------------------------------------------------------  Chemistries  Recent Labs  Lab 02/19/18 1155   02/20/18 0831 02/21/18 0328  NA 131*   < > 139 141  K 6.6*   < > 4.8 5.3*  CL 96*   < > 104 103  CO2 19*   < > 28 32  GLUCOSE 153*   < > 136* 149*  BUN 91*   < > 58* 38*  CREATININE 4.16*   < > 1.48* 1.10  CALCIUM 8.2*   < > 8.7* 9.0  MG  --    < > 2.2  --   AST 23  --   --   --   ALT 21  --   --   --   ALKPHOS 94  --   --   --   BILITOT 1.2  --   --   --    < > = values in this interval not displayed.   ------------------------------------------------------------------------------------------------------------------  Cardiac Enzymes Recent Labs  Lab 02/19/18 2107 02/20/18 0213  TROPONINI 0.09* 0.08*   ------------------------------------------------------------------------------------------------------------------  RADIOLOGY:  Dg Abd 1 View  Result Date: 02/19/2018 CLINICAL DATA:  Orogastric tube placement EXAM: ABDOMEN - 1 VIEW COMPARISON:  None. FINDINGS: Tip of the orogastric tube is in the body of the stomach. Multiple linear devices project over the abdomen. A spinal stimulator is in place in the lower thoracic spine. There are no disproportionally dilated loops of bowel. Lower lumbar spinal stabilization hardware is in place. IMPRESSION: Orogastric tube tip is in the body of the stomach Nonobstructive bowel gas pattern. Electronically Signed   By: Jolaine Click M.D.   On: 02/19/2018 20:55   US Renal  Result Date: 02/20/2018 CLINICAL DATA:  Acute renal failure EXAM: RENAL / URINARY TRACT ULTRASOUND COMPLETE  COMPARISON:  Abdominal CT 04/24/2016 FINDINGS: Right Kidney: Length: 12 cm. Echogenicity within normal limits. No mass or hydronephrosis visualized. Left Kidney: Length: 14 cm. 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 No evident cortical thickening or hilar fat effacement. Echogenicity within normal limits. No mass or hydronephrosis visualized. Bladder: Limited visualization due to decompressed state.  No pathologic finding. Prominent increased echogenicity of the visualized liver. IMPRESSION: Negative renal ultrasound. Hepatic steatosis. Electronically Signed   By: Marnee SpringJonathon  Watts M.D.   On: 02/20/2018 11:29   Dg Chest Port 1 View  Result Date: 02/21/2018 CLINICAL DATA:  Acute respiratory failure EXAM: PORTABLE CHEST 1 VIEW COMPARISON:  02/19/2018 FINDINGS: Endotracheal tube terminates 4.5 cm above the carina. Enteric tube coursing below the diaphragm. Lungs are essentially clear. No focal consolidation. No pleural effusion or pneumothorax. The heart is normal in size. IMPRESSION: Endotracheal tube terminates 4.5 cm above the carina. Electronically Signed   By: Charline BillsSriyesh  Krishnan M.D.   On: 02/21/2018 07:28    ASSESSMENT AND PLAN:   Active Problems:   Respiratory failure (HCC)  47 year old male with history of hypertension, chronic back pain on chronic narcotics with back stimulator presents to the emergency room with 2 days of generalized weakness and unresponsiveness.  1.  Sepsis with acute hypoxic respiratory failure in the setting of pneumonia: Patient presents with tachypnea, leukocytosis and tachycardia S/p intubation Management as per intensivist  2.  Pneumonia: Cefepime and vancomycin Follow-up on blood cultures  3.  Hyperkalemia: This was treated in the emergency room.  This is due to acute kidney injury. CRRT with help of nephrology.  4.  Acute kidney injury in the setting of poor p.o. intake and sepsis Nephrology consultation Dialysis done. Now nephrologist is just monitoring renal function, it is improving and patient had good urine output.  5.  Hyponatremia from dehydration  6.  Elevated troponin: This is likely due to sepsis  echocardiogram  7.  Diabetes: Monitor as per ICU protocol   All the records are reviewed and case discussed with Care Management/Social Workerr. Management plans discussed with the patient, family and they are in agreement.  CODE  STATUS: full.  TOTAL TIME TAKING CARE OF THIS PATIENT: 35 minutes.   Discussed with his wife in the room today.  POSSIBLE D/C IN 1-2 DAYS, DEPENDING ON CLINICAL CONDITION.   Altamese DillingVaibhavkumar Kaynen Minner M.D on 02/21/2018   Between 7am to 6pm - Pager - [phone]  After 6pm go to [email or web address] - Social research officer, governmentpassword [email or web address]  [facility]  Office  [phone]  CC: Primary care physician; Lauro RegulusAnderson, Marshall W, MD  Note: This dictation was prepared with Dragon dictation along with smaller phrase technology. Any transcriptional errors that result from this process are unintentional.

## 2018-02-21 NOTE — Progress Notes (Signed)
CRITICAL VALUE ALERT  Critical Value:  5.7 potassium  Date & Time Notied:  02/21/18  Provider Notified: MD Conforti   Orders Received/Actions taken: Kayexalate 15gm/4160ml

## 2018-02-21 NOTE — Progress Notes (Signed)
Follow up - Critical Care Medicine Note  Patient Details:    Martin Riddle is an 47 y.o. male. with a past medical history remarkable for hypertension, hyperlipidemia, diabetes mellitus, pancreatitis, chronic back pain with implantable spinal stimulator and chronic narcotic use was brought into the emergency department with decreased responsiveness and generalized weakness. Over the past week he has had an upper respiratory tract infection although his wife reports that he had improved until 2 days ago. Patient began feeling more weak and fatigued. She denied that he had any fever or chills. In emergency department patient became more somnolent with increased work of breathing and respiratory distress and was subsequently intubated. Pertinent labs reveal sodium 131, potassium 6.6, BUN 91, creatinine 4.16, troponin 0.05, anion gap of 16, leukocytosis of 19, anemia at hemoglobin 12.9. He was subsequently brought to the intensive care unit, started on CRRT  Lines, Airways, Drains: Airway 8 mm (Active)  Secured at (cm) 25 cm 02/20/2018  8:10 AM  Measured From Lips 02/20/2018  8:10 AM  Secured Location Center 02/20/2018  8:10 AM  Secured By [employer] 02/20/2018  8:10 AM  Tube Holder Repositioned Yes 02/20/2018  8:10 AM  Cuff Pressure (cm H2O) 26 cm H2O 02/20/2018  8:10 AM  Site Condition Dry 02/20/2018  8:10 AM     NG/OG Tube Orogastric 16 Fr. Center mouth Xray Documented cm marking at nare/ corner of mouth 70 cm (Active)  Cm Marking at Nare/Corner of Mouth (if applicable) 70 cm 02/20/2018  8:00 AM  Site Assessment Clean;Dry;Intact 02/20/2018  8:00 AM  Ongoing Placement Verification No change in cm markings or external length of tube from initial placement 02/20/2018  8:00 AM  Status Suction-low intermittent 02/20/2018  8:00 AM  Drainage Appearance Bile 02/20/2018  8:00 AM  Output (mL) 0 mL 02/20/2018  5:54 AM     Rectal Tube/Pouch (Active)  Output (mL) 0 mL 02/20/2018  5:54 AM     Urethral  Catheter Sam RN Double-lumen;Non-latex;Temperature probe 14 Fr. (Active)  Indication for Insertion or Continuance of Catheter Unstable critical patients (first 24-48 hours) 02/20/2018  8:00 AM  Site Assessment Clean;Intact 02/20/2018  8:00 AM  Catheter Maintenance Bag below level of bladder;Catheter secured;Drainage bag/tubing not touching floor;Insertion date on drainage bag;No dependent loops;Seal intact;Bag emptied prior to transport 02/20/2018  8:00 AM  Collection Container Standard drainage bag 02/20/2018  8:00 AM  Securement Method Securing device (Describe) 02/20/2018  8:00 AM  Urinary Catheter Interventions Unclamped 02/20/2018  8:00 AM  Output (mL) 300 mL 02/20/2018  5:54 AM    Anti-infectives:  Anti-infectives (From admission, onward)   Start     Dose/Rate Route Frequency Ordered Stop   02/20/18 1200  vancomycin (VANCOCIN) IVPB 1000 mg/200 mL premix     1,000 mg 200 mL/hr over 60 Minutes Intravenous Every 24 hours 02/19/18 1523     02/19/18 1600  ceFEPIme (MAXIPIME) 2 g in sodium chloride 0.9 % 100 mL IVPB     2 g 200 mL/hr over 30 Minutes Intravenous Every 12 hours 02/19/18 1509     02/19/18 1530  vancomycin (VANCOCIN) 2,000 mg in sodium chloride 0.9 % 500 mL IVPB     2,000 mg 250 mL/hr over 120 Minutes Intravenous  Once 02/19/18 1509 02/19/18 1823   02/19/18 1400  vancomycin (VANCOCIN) IVPB 1000 mg/200 mL premix  Status:  Discontinued     1,000 mg 200 mL/hr over 60 Minutes Intravenous  Once 02/19/18 1353 02/19/18 1509   02/19/18 1145  cefTRIAXone (ROCEPHIN) 2 g in sodium chloride 0.9 % 100 mL IVPB  Status:  Discontinued     2 g 200 mL/hr over 30 Minutes Intravenous Every 24 hours 02/19/18 1143 02/19/18 1509   02/19/18 1145  azithromycin (ZITHROMAX) 500 mg in sodium chloride 0.9 % 250 mL IVPB     500 mg 250 mL/hr over 60 Minutes Intravenous Every 24 hours 02/19/18 1143 02/23/18 2359      Microbiology: Results for orders placed or performed during the hospital encounter of  02/19/18  Blood culture (routine x 2)     Status: None (Preliminary result)   Collection Time: 02/19/18 11:02 AM  Result Value Ref Range Status   Specimen Description BLOOD LEFT ANTECUBITAL  Final   Special Requests   Final    BOTTLES DRAWN AEROBIC AND ANAEROBIC Blood Culture results may not be optimal due to an inadequate volume of blood received in culture bottles   Culture   Final    NO GROWTH 2 DAYS Performed at [facility], [address], [place], [place] [address]    Report Status PENDING  Incomplete  Urine culture     Status: None   Collection Time: 02/19/18 11:36 AM  Result Value Ref Range Status   Specimen Description   Final    URINE, CLEAN CATCH Performed at [facility], [address] [place], [place] [address]    Special Requests   Final    NONE Performed at [facility], [address], [place], [place] [address]    Culture   Final    NO GROWTH Performed at [facility], [address], [place], [place] [address]    Report Status 02/20/2018 FINAL  Final  Blood culture (routine x 2)     Status: None (Preliminary result)   Collection Time: 02/19/18 11:55 AM  Result Value Ref Range Status   Specimen Description BLOOD RIGHT ANTECUBITAL  Final   Special Requests   Final    BOTTLES DRAWN AEROBIC AND ANAEROBIC Blood Culture adequate volume   Culture   Final    NO GROWTH 2 DAYS Performed at [facility], [address], [place], [place] [address]    Report Status PENDING  Incomplete  MRSA PCR Screening     Status: None   Collection Time: 02/19/18  2:31 PM  Result Value Ref Range Status   MRSA by PCR NEGATIVE NEGATIVE Final    Comment:        The GeneXpert MRSA Assay (FDA approved for NASAL specimens only), is one component of a comprehensive MRSA colonization surveillance program. It is not intended to diagnose MRSA infection nor to guide or monitor treatment for MRSA infections. Performed at [facility], [address] [place], [place] [address]     Studies: Dg Abd 1 View  Result Date: 02/19/2018 CLINICAL DATA:  Orogastric tube placement EXAM: ABDOMEN - 1 VIEW COMPARISON:  None. FINDINGS: Tip of the orogastric tube is in the body of the stomach. Multiple linear devices project over the abdomen. A spinal stimulator is in place in the lower thoracic spine. There are no disproportionally dilated loops of bowel. Lower lumbar spinal stabilization hardware is in place. IMPRESSION: Orogastric tube tip is in the body of the stomach Nonobstructive bowel gas pattern. Electronically Signed   By: Jolaine Click M.D.   On: 02/19/2018 20:55   US Renal  Result Date: 02/20/2018 CLINICAL DATA:  Acute renal failure EXAM: RENAL / URINARY TRACT  ULTRASOUND COMPLETE COMPARISON:  Abdominal CT 04/24/2016 FINDINGS: Right Kidney: Length: 12 cm. Echogenicity within normal limits. No mass or hydronephrosis visualized. Left Kidney: Length: 14 cm. 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 No evident cortical thickening or hilar fat effacement. Echogenicity within normal limits. No mass or hydronephrosis visualized. Bladder: Limited visualization due to decompressed state. No pathologic finding. Prominent increased echogenicity of the visualized liver. IMPRESSION: Negative renal ultrasound. Hepatic steatosis. Electronically Signed   By: Marnee SpringJonathon  Watts M.D.   On: 02/20/2018 11:29   Dg Chest Port 1 View  Result Date: 02/21/2018 CLINICAL DATA:  Acute respiratory failure EXAM: PORTABLE CHEST 1 VIEW COMPARISON:  02/19/2018 FINDINGS: Endotracheal tube terminates 4.5 cm above the carina. Enteric tube coursing below the diaphragm. Lungs are essentially clear. No focal consolidation. No pleural effusion or pneumothorax. The heart is normal in size. IMPRESSION: Endotracheal tube terminates 4.5 cm above the carina. Electronically Signed   By: Charline BillsSriyesh   Krishnan M.D.   On: 02/21/2018 07:28   Dg Chest Portable 1 View  Result Date: 02/19/2018 CLINICAL DATA:  Post intubation as brought to ER today due to increased unresponsiveness. EXAM: PORTABLE CHEST 1 VIEW COMPARISON:  02/19/2018 at 11:05 a.m. FINDINGS: Patient is rotated to the right. There is been interval placement of an endotracheal tube with tip 6.3 cm above the carina. Lungs are somewhat hypoinflated demonstrate continued hazy opacification over the left upper lung without significant change. Cardiomediastinal silhouette and remainder of the exam is unchanged. IMPRESSION: Stable hazy opacification over the left upper lobe. Endotracheal tube with tip 6.3 cm above the carina. Electronically Signed   By: Elberta Fortisaniel  Boyle M.D.   On: 02/19/2018 13:55   Dg Chest Portable 1 View  Result Date: 02/19/2018 CLINICAL DATA:  Weakness, altered mental status EXAM: PORTABLE CHEST 1 VIEW COMPARISON:  Chest x-ray dated 01/08/2017. FINDINGS: Ill-defined airspace opacity within the LEFT upper lung, pneumonia versus asymmetric edema. RIGHT lung is clear. No pleural effusion seen. Heart size and mediastinal contours are likely accentuated by patient's semi-erect and slightly oblique positioning. No acute or suspicious osseous finding. IMPRESSION: LEFT upper lobe pneumonia versus asymmetric pulmonary edema. Electronically Signed   By: Bary RichardStan  Maynard M.D.   On: 02/19/2018 11:25    Consults: Treatment Team:  [facility], MD Tora Kindredonforti, Sanel Stemmer, DO   Subjective:    Overnight Issues:patient did well overnight, hemodialysis was stopped,pressors were weaned off, patient is awake and responsive  Objective:  Vital signs for last 24 hours: Temp:  [97.7 F (36.5 C)-100.9 F (38.3 C)] 100.9 F (38.3 C) (09/15 0800) Pulse Rate:  [80-101] 99 (09/15 0817) Resp:  [17-26] 25 (09/15 0817) BP: (89-143)/(48-73) 143/63 (09/15 0800) SpO2:  [87 %-99 %] 95 % (09/15 0817) FiO2 (%):  [45 %-55 %] 50 % (09/15 0817) Weight:   [142.1 kg] 142.1 kg (09/15 0327)  Hemodynamic parameters for last 24 hours:    Intake/Output from previous day: 09/14 0701 - 09/15 0700 In: 2919.9 [I.V.:2108.8; IV Piggyback:811.2] Out: 5140 [Urine:4940; Emesis/NG output:200]  Intake/Output this shift: No intake/output data recorded.  Vent settings for last 24 hours: Vent Mode: PRVC FiO2 (%):  [45 %-55 %] 50 % Set Rate:  [20 bmp] 20 bmp Vt Set:  [550 mL] 550 mL PEEP:  [5 cmH20] 5 cmH20 Plateau Pressure:  [18 cmH20-20 cmH20] 19 cmH20  Physical Exam:   Vital signs:       Please see the above listed vital signs HEENT:  Patient is intubated, oral endotracheal tube and oral gastric tube in place, face is somewhat flushed, trachea midline, no thyromegaly appreciated, no jugular venous distention is noted Cardiovascular:           Tachycardia appreciated sinus mechanism Pulmonary:      Coarse rhonchi appreciated left greater than right Abdominal:      Positive bowel sounds, soft exam Extremities:     Multiple excoriations noted bilateral lower extremity, chronic discoloration from peripheral vascular disease, 1+ edema noted Neurologic:      Patient moves all extremities, intubated on sedation   Assessment/Plan:   Respiratory failure. Patient required intubation and placed on mechanical ventilation. Left upper lobe pneumonia noted on chest x-ray. On vancomycin and cefepime with blood and sputum cultures obtained. Patient has significantly improved, will start spontaneous awakening and breathing trials this morning  Sepsis Shock. On broad-spectrum antibiotic coverage, weaned off pressors  Renal failure. elevated BUN/creatinine most likely secondary to ATN from sepsis. This morning's labs, BUN 38/creatinine 1.1, potassium 5.3  Diabetes. ICU protocol  Critical care time 40 minutes   Niyonna Betsill 02/21/2018  *Care during the described time interval was provided by me and/or other providers on the critical care team.   I have reviewed this patient's available data, including medical history, events of note, physical examination and test results as part of my evaluation. Patient ID: Jessi Jessop, male   DOB: 03-29-1971, 47 y.o.   MRN: [id number]

## 2018-02-21 NOTE — Progress Notes (Signed)
[facility]  ROUNDING NOTE   Subjective:  Patient overall remains critically ill. Urine output over the preceding 24 hours was 5.1 L. Renal function overall improved from admission as creatinine now down to 1.1.  Objective:  Vital signs in last 24 hours:  Temp:  [98.6 F (37 C)-100.9 F (38.3 C)] 100.6 F (38.1 C) (09/15 1000) Pulse Rate:  [81-107] 107 (09/15 1000) Resp:  [19-28] 24 (09/15 1000) BP: (89-148)/(48-69) 108/65 (09/15 1000) SpO2:  [87 %-97 %] 97 % (09/15 1121) FiO2 (%):  [45 %-55 %] 45 % (09/15 1121) Weight:  [142.1 kg] 142.1 kg (09/15 0327)  Weight change: -3.051 kg Filed Weights   02/19/18 1054 02/20/18 0322 02/21/18 0327  Weight: (!) 145.2 kg (!) 144.7 kg (!) 142.1 kg    Intake/Output: I/O last 3 completed shifts: In: 2919.9 [I.V.:2108.8; IV Piggyback:811.2] Out: 8340 [Urine:8140; Emesis/NG output:200]   Intake/Output this shift:  No intake/output data recorded.  Physical Exam: General: Critically ill appearing  Head: Normocephalic, atraumatic. ETT in place  Eyes: Anicteric  Neck: Supple, trachea midline  Lungs:  Scattered rhonchi, vent assisted  Heart: S1S2 no rubs  Abdomen:  Soft, nontender, bowel sounds present  Extremities: 1+ peripheral edema.  Neurologic: Intubated/sedated  Skin: No lesions  Access: Right femoral dialysis catheter    Basic Metabolic Panel: Recent Labs  Lab 02/19/18 1642 02/19/18 1837 02/19/18 2107 02/20/18 0213 02/20/18 0831 02/21/18 0328  NA 131*  132* 133* 134* 136 139 141  K 6.6*  6.6* 5.5* 5.1 4.7 4.8 5.3*  CL 99  100 101 102 104 104 103  CO2 21*  21* 21* 19* 23 28 32  GLUCOSE 170*  176* 158* 172* 165* 136* 149*  BUN 92*  97* 91* 82* 75* 58* 38*  CREATININE 4.06*  3.97* 3.66* 3.14* 2.32* 1.48* 1.10  CALCIUM 7.6*  7.8* 7.7* 7.9* 8.3* 8.7* 9.0  MG 1.9 2.0 2.1 2.1 2.2  --   PHOS 7.9* 7.3* 6.3* 5.0* 3.6  --     Liver Function Tests: Recent Labs  Lab 02/19/18 1155 02/19/18 1642  02/19/18 1837 02/19/18 2107 02/20/18 0213 02/20/18 0831  AST 23  --   --   --   --   --   ALT 21  --   --   --   --   --   ALKPHOS 94  --   --   --   --   --   BILITOT 1.2  --   --   --   --   --   PROT 7.8  --   --   --   --   --   ALBUMIN 3.4* 2.9* 3.2* 3.1* 3.2* 3.0*   No results for input(s): LIPASE, AMYLASE in the last 168 hours. No results for input(s): AMMONIA in the last 168 hours.  CBC: Recent Labs  Lab 02/19/18 1155  WBC 19.2*  NEUTROABS 16.5*  HGB 12.9*  HCT 39.2*  MCV 94.7  PLT 154    Cardiac Enzymes: Recent Labs  Lab 02/19/18 1155 02/19/18 1642 02/19/18 2107 02/20/18 0213  TROPONINI 0.05* 0.12* 0.09* 0.08*    BNP: Invalid input(s): POCBNP  CBG: Recent Labs  Lab 02/20/18 1539 02/20/18 1944 02/21/18 0014 02/21/18 0338 02/21/18 0741  GLUCAP 157* 143* 139* 140* 151*    Microbiology: Results for orders placed or performed during the hospital encounter of 02/19/18  Blood culture (routine x 2)     Status: None (Preliminary result)   Collection  Time: 02/19/18 11:02 AM  Result Value Ref Range Status   Specimen Description BLOOD LEFT ANTECUBITAL  Final   Special Requests   Final    BOTTLES DRAWN AEROBIC AND ANAEROBIC Blood Culture results may not be optimal due to an inadequate volume of blood received in culture bottles   Culture   Final    NO GROWTH 2 DAYS Performed at [facility], [address], [place], [place] [address]    Report Status PENDING  Incomplete  Urine culture     Status: None   Collection Time: 02/19/18 11:36 AM  Result Value Ref Range Status   Specimen Description   Final    URINE, CLEAN CATCH Performed at [facility], [address], [place], [place] [address]    Special Requests   Final    NONE Performed at [facility], [address] [place], [place] [address]    Culture   Final    NO GROWTH Performed at [facility], [place] [address], [place], [place] [address]    Report  Status 02/20/2018 FINAL  Final  Blood culture (routine x 2)     Status: None (Preliminary result)   Collection Time: 02/19/18 11:55 AM  Result Value Ref Range Status   Specimen Description BLOOD RIGHT ANTECUBITAL  Final   Special Requests   Final    BOTTLES DRAWN AEROBIC AND ANAEROBIC Blood Culture adequate volume   Culture   Final    NO GROWTH 2 DAYS Performed at [facility], [address], [place], [place] [address]    Report Status PENDING  Incomplete  MRSA PCR Screening     Status: None   Collection Time: 02/19/18  2:31 PM  Result Value Ref Range Status   MRSA by PCR NEGATIVE NEGATIVE Final    Comment:        The GeneXpert MRSA Assay (FDA approved for NASAL specimens only), is one component of a comprehensive MRSA colonization surveillance program. It is not intended to diagnose MRSA infection nor to guide or monitor treatment for MRSA infections. Performed at [facility], [address] [place], [place] [address]     Coagulation Studies: No results for input(s): LABPROT, INR in the last 72 hours.  Urinalysis: Recent Labs    02/19/18 1136  COLORURINE YELLOW*  LABSPEC 1.014  PHURINE 5.0  GLUCOSEU NEGATIVE  HGBUR MODERATE*  BILIRUBINUR NEGATIVE  KETONESUR NEGATIVE  PROTEINUR 30*  NITRITE NEGATIVE  LEUKOCYTESUR NEGATIVE      Imaging: Dg Abd 1 View  Result Date: 02/19/2018 CLINICAL DATA:  Orogastric tube placement EXAM: ABDOMEN - 1 VIEW COMPARISON:  None. FINDINGS: Tip of the orogastric tube is in the body of the stomach. Multiple linear devices project over the abdomen. A spinal stimulator is in place in the lower thoracic spine. There are no disproportionally dilated loops of bowel. Lower lumbar spinal stabilization hardware is in place. IMPRESSION: Orogastric tube tip is in the body of the stomach Nonobstructive bowel gas pattern. Electronically Signed   By: Marybelle Killings M.D.   On: 02/19/2018 20:55   US Renal  Result Date:  02/20/2018 CLINICAL DATA:  Acute renal failure EXAM: RENAL / URINARY TRACT ULTRASOUND COMPLETE COMPARISON:  Abdominal CT 04/24/2016 FINDINGS: Right Kidney: Length: 12 cm. Echogenicity within normal limits. No mass or hydronephrosis visualized. Left Kidney: Length: 14 cm. 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 No evident cortical thickening or hilar  fat effacement. Echogenicity within normal limits. No mass or hydronephrosis visualized. Bladder: Limited visualization due to decompressed state. No pathologic finding. Prominent increased echogenicity of the visualized liver. IMPRESSION: Negative renal ultrasound. Hepatic steatosis. Electronically Signed   By: Monte Fantasia M.D.   On: 02/20/2018 11:29   Dg Chest Port 1 View  Result Date: 02/21/2018 CLINICAL DATA:  Acute respiratory failure EXAM: PORTABLE CHEST 1 VIEW COMPARISON:  02/19/2018 FINDINGS: Endotracheal tube terminates 4.5 cm above the carina. Enteric tube coursing below the diaphragm. Lungs are essentially clear. No focal consolidation. No pleural effusion or pneumothorax. The heart is normal in size. IMPRESSION: Endotracheal tube terminates 4.5 cm above the carina. Electronically Signed   By: Julian Hy M.D.   On: 02/21/2018 07:28   Dg Chest Portable 1 View  Result Date: 02/19/2018 CLINICAL DATA:  Post intubation as brought to ER today due to increased unresponsiveness. EXAM: PORTABLE CHEST 1 VIEW COMPARISON:  02/19/2018 at 11:05 a.m. FINDINGS: Patient is rotated to the right. There is been interval placement of an endotracheal tube with tip 6.3 cm above the carina. Lungs are somewhat hypoinflated demonstrate continued hazy opacification over the left upper lung without significant change. Cardiomediastinal silhouette and remainder of the exam is unchanged. IMPRESSION: Stable hazy opacification over the left upper lobe. Endotracheal tube with tip 6.3 cm above the  carina. Electronically Signed   By: Marin Olp M.D.   On: 02/19/2018 13:55     Medications:   . azithromycin 500 mg (02/21/18 1016)  . ceFEPime (MAXIPIME) IV Stopped (02/21/18 0352)  . dexmedetomidine (PRECEDEX) IV infusion 1.2 mcg/kg/hr (02/21/18 1003)  . famotidine (PEPCID) IV Stopped (02/20/18 2256)  . fentaNYL infusion INTRAVENOUS 400 mcg/hr (02/21/18 0945)  . midazolam (VERSED) infusion 5 mg/hr (02/21/18 0850)  . norepinephrine (LEVOPHED) Adult infusion Stopped (02/21/18 0326)  . vancomycin     . chlorhexidine gluconate (MEDLINE KIT)  15 mL Mouth Rinse BID  . dextrose  25 g Intravenous Once  . heparin injection (subcutaneous)  5,000 Units Subcutaneous Q8H  . insulin aspart  10 Units Intravenous Once  . insulin aspart  2-6 Units Subcutaneous Q4H  . mouth rinse  15 mL Mouth Rinse 10 times per day   acetaminophen, heparin, midazolam  Assessment/ Plan:  47 y.o. male with a PMHx of diabetes mellitus type 2, hypertension, hyperlipidemia, pancreatitis, chronic back pain status post implantable spinal cord stimulator, chronic narcotic use, who was admitted to [facility] on 02/19/2018 for evaluation of decreased level of consciousness and generalized weakness.   1.  Acute renal failure, baseline creatinine 1.2 with a EGFR 65.  Acute renal failure suspected secondary to hypotension and acute tubular necrosis. 2.  Hyperkalemia 3.  Acute respiratory failure. 4.  Hyponatremia, likely hypovolemic. 5.  Acidosis, mixed.  Plan: Patient remains critically ill at this point in time.  Acute respiratory failure persists and the patient is still on the ventilator.  He is also noted to be febrile today with a temperature of 100.6.  Management of fever as well as acute respiratory failure as per pulmonary/critical care.  In regards to his acute renal failure his creatinine is now down to 1.1 with urine output of 5.1 L.  Potassium also slightly high at 5.3 today but recommend just monitoring of this for  now.  No further need for dialysis.  Defer management of temporary right femoral dialysis catheter to pulmonary/critical care in case any additional vasoactive drips need to be administered as the dialysis catheter has a third port.  LOS: 2 Jeron Grahn 9/15/201911:26 AM

## 2018-02-21 NOTE — Progress Notes (Signed)
Initial Nutrition Assessment  DOCUMENTATION CODES:   Obesity unspecified  INTERVENTION:  If patient remains intubated recommend initiating Vital High Protein at 50 mL/hr (1200 mL goal daily volume) + Pro-Stat 60 mL TID via OGT. Provides 1800 kcal, 195 grams of protein, 1008 mL H2O daily.  If tube feeds are initiated provide liquid MVI daily per tube as goal TF regimen does not meet 100% RDIs for vitamins/minerals.  NUTRITION DIAGNOSIS:   Inadequate oral intake related to inability to eat as evidenced by NPO status.  GOAL:   Provide needs based on ASPEN/SCCM guidelines  MONITOR:   Vent status, Labs, Weight trends, TF tolerance, Skin, I & O's  REASON FOR ASSESSMENT:   Ventilator    ASSESSMENT:   47 year old male with PMHx of DM, HTN, hypercholesterolemia, pancreatitis, chronic back pain s/p placement of spinal stimulator, chronic narcotic use who was admitted with respiratory failure requiring intubation 9/13, left upper lobe PNA, septic shock, renal failure requiring CRRT 9/13-9/14 now improving.   Patient intubated and sedated. At time of RD assessment patient was on PRVC mode with FiO2 50% and PEEP 5 cmH2O. No family members at bedside. Abdomen feels soft. Limited weight history in chart. Patient was 114.6 lbs in 2017. Weight from today is 142.1 kg (313.27 lbs).  Access: 16 Fr. OGT placed 9/13; terminates in stomach per abdominal x-ray 9/13; 70 cm at corner of mouth  MAP: 68-84 mmHg  Patient is currently intubated on ventilator support Ve: 10.9 L/min Temp (24hrs), Avg:100.5 F (38.1 C), Min:99.7 F (37.6 C), Max:101.3 F (38.5 C)  Propofol: N/A  Medications reviewed and include: Novolog 2-6 units Q4hrs, azithromycin, cefepime, Precedex gtt, famotidine, fentanyl gtt,  Versed gtt, norepinephrine gtt off since 0456, vancomycin.  Labs reviewed: CBG 140-168, Potassium 5.3, BUN 38.  I/O: 4940 mL UOP yesterday (1.4 mL/kg/hr)  Patient does not meet criteria for  malnutrition at this time.  Discussed with RN and MD. Assessing for extubation today.  NUTRITION - FOCUSED PHYSICAL EXAM:    Most Recent Value  Orbital Region  No depletion  Upper Arm Region  No depletion  Thoracic and Lumbar Region  No depletion  Buccal Region  Unable to assess  Temple Region  No depletion  Clavicle Bone Region  No depletion  Clavicle and Acromion Bone Region  No depletion  Scapular Bone Region  Unable to assess  Dorsal Hand  No depletion  Patellar Region  No depletion  Anterior Thigh Region  No depletion  Posterior Calf Region  No depletion  Edema (RD Assessment)  Mild  Hair  Reviewed  Eyes  Unable to assess  Mouth  Unable to assess  Skin  Reviewed  Nails  Reviewed     Diet Order:   Diet Order    None      EDUCATION NEEDS:   No education needs have been identified at this time  Skin:  Skin Assessment: Skin Integrity Issues: Skin Integrity Issues:: Diabetic Ulcer Diabetic Ulcer: right great toe  Last BM:  02/19/2018 - BM characteristics not documented  Height:   Ht Readings from Last 1 Encounters:  02/19/18 6\' 4"  (1.93 m)    Weight:   Wt Readings from Last 1 Encounters:  02/21/18 (!) 142.1 kg    Ideal Body Weight:  91.8 kg  BMI:  Body mass index is 38.13 kg/m.  Estimated Nutritional Needs:   Kcal:  4540-98111563-1989 (11-14 kcal/kg)  Protein:  184 grams (2 grams/kg IBW)  Fluid:  2.3-2.7 L/day (25-30 mL/kg  IBW)  Helane Rima, MS, RD, LDN Office: [phone] Pager: [phone] After Hours/Weekend Pager: [phone]

## 2018-02-21 NOTE — Progress Notes (Signed)
[facility] [place] [facility]   PATIENT NAME: Martin Riddle    MR#:  [id number]  DATE OF BIRTH:  02/28/1971  SUBJECTIVE:  CHIEF COMPLAINT:   Chief Complaint  Patient presents with  . Weakness   Brought with altered mental status and noted to have respiratory failure and intubated.  Septic shock with multiorgan failure and required dialysis initially.  Blood pressure stable.  REVIEW OF SYSTEMS:   Patient is intubated and sedated.  ROS  DRUG ALLERGIES:   Allergies  Allergen Reactions  . Morphine And Related Other (See Comments)    headache    VITALS:  Blood pressure (!) 111/50, pulse 84, temperature (!) 101.3 F (38.5 C), resp. rate (!) 32, height 6\' 4"  (1.93 m), weight (!) 142.1 kg, SpO2 92 %.  PHYSICAL EXAMINATION:  GENERAL:  47 y.o.-year-old patient lying in the bed with no acute distress.  EYES: Pupils equal, round, reactive to light and accommodation. No scleral icterus. Extraocular muscles intact.  HEENT: Head atraumatic, normocephalic. Oropharynx and nasopharynx clear.  NECK:  Supple, no jugular venous distention. No thyroid enlargement, no tenderness.  LUNGS: Normal breath sounds bilaterally, no wheezing, rales,rhonchi or crepitation. No use of accessory muscles of respiration.  CARDIOVASCULAR: S1, S2 normal. No murmurs, rubs, or gallops.  ABDOMEN: Soft, nontender, nondistended. Bowel sounds present. No organomegaly or mass.  EXTREMITIES: No pedal edema, cyanosis, or clubbing.  NEUROLOGIC: Patient is sedated and intubated currently. PSYCHIATRIC: Sedated and intubated.  SKIN: No obvious rash, lesion, or ulcer.   Physical Exam LABORATORY PANEL:   CBC Recent Labs  Lab 02/19/18 1155  WBC 19.2*  HGB 12.9*  HCT 39.2*  PLT 154   ------------------------------------------------------------------------------------------------------------------  Chemistries  Recent Labs  Lab 02/19/18 1155  02/20/18 0831 02/21/18 0328  NA 131*   < > 139  141  K 6.6*   < > 4.8 5.3*  CL 96*   < > 104 103  CO2 19*   < > 28 32  GLUCOSE 153*   < > 136* 149*  BUN 91*   < > 58* 38*  CREATININE 4.16*   < > 1.48* 1.10  CALCIUM 8.2*   < > 8.7* 9.0  MG  --    < > 2.2  --   AST 23  --   --   --   ALT 21  --   --   --   ALKPHOS 94  --   --   --   BILITOT 1.2  --   --   --    < > = values in this interval not displayed.   ------------------------------------------------------------------------------------------------------------------  Cardiac Enzymes Recent Labs  Lab 02/19/18 2107 02/20/18 0213  TROPONINI 0.09* 0.08*   ------------------------------------------------------------------------------------------------------------------  RADIOLOGY:  Dg Abd 1 View  Result Date: 02/19/2018 CLINICAL DATA:  Orogastric tube placement EXAM: ABDOMEN - 1 VIEW COMPARISON:  None. FINDINGS: Tip of the orogastric tube is in the body of the stomach. Multiple linear devices project over the abdomen. A spinal stimulator is in place in the lower thoracic spine. There are no disproportionally dilated loops of bowel. Lower lumbar spinal stabilization hardware is in place. IMPRESSION: Orogastric tube tip is in the body of the stomach Nonobstructive bowel gas pattern. Electronically Signed   By: Jolaine ClickArthur  Hoss M.D.   On: 02/19/2018 20:55   [place] Renal  Result Date: 02/20/2018 CLINICAL DATA:  Acute renal failure EXAM: RENAL / URINARY TRACT ULTRASOUND COMPLETE COMPARISON:  Abdominal CT 04/24/2016 FINDINGS: Right  Kidney: Length: 12 cm. Echogenicity within normal limits. No mass or hydronephrosis visualized. Left Kidney: Length: 14 cm. 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 No evident cortical thickening or hilar fat effacement. Echogenicity within normal limits. No mass or hydronephrosis visualized. Bladder: Limited visualization due to decompressed state. No pathologic finding. Prominent increased  echogenicity of the visualized liver. IMPRESSION: Negative renal ultrasound. Hepatic steatosis. Electronically Signed   By: Marnee Spring M.D.   On: 02/20/2018 11:29   Dg Chest Port 1 View  Result Date: 02/21/2018 CLINICAL DATA:  Acute respiratory failure EXAM: PORTABLE CHEST 1 VIEW COMPARISON:  02/19/2018 FINDINGS: Endotracheal tube terminates 4.5 cm above the carina. Enteric tube coursing below the diaphragm. Lungs are essentially clear. No focal consolidation. No pleural effusion or pneumothorax. The heart is normal in size. IMPRESSION: Endotracheal tube terminates 4.5 cm above the carina. Electronically Signed   By: Charline Bills M.D.   On: 02/21/2018 07:28    ASSESSMENT AND PLAN:   Active Problems:   Respiratory failure (HCC)  47 year old male with history of hypertension, chronic back pain on chronic narcotics with back stimulator presents to the emergency room with 2 days of generalized weakness and unresponsiveness.  1.  Sepsis with acute hypoxic respiratory failure in the setting of pneumonia: Patient presents with tachypnea, leukocytosis and tachycardia S/p intubation Management as per intensivist  2.  Pneumonia: Cefepime and vancomycin Follow-up on blood cultures  3.  Hyperkalemia: This was treated in the emergency room.  This is due to acute kidney injury. CRRT with help of nephrology.  4.  Acute kidney injury in the setting of poor p.o. intake and sepsis Nephrology consultation Dialysis done.  5.  Hyponatremia from dehydration  6.  Elevated troponin: This is likely due to sepsis With elevation in BNP I will order echocardiogram  7.  Diabetes: Monitor as per ICU protocol    All the records are reviewed and case discussed with Care Management/Social Workerr. Management plans discussed with the patient, family and they are in agreement.  CODE STATUS: full.  TOTAL TIME TAKING CARE OF THIS PATIENT: 35 minutes.     POSSIBLE D/C IN 1-2 DAYS, DEPENDING  ON CLINICAL CONDITION.   Altamese Dilling M.D on 02/21/2018   Between 7am to 6pm - Pager - [phone]  After 6pm go to [email or web address] - Social research officer, government  [facility]  Office  [phone]  CC: Primary care physician; Lauro Regulus, MD  Note: This dictation was prepared with Dragon dictation along with smaller phrase technology. Any transcriptional errors that result from this process are unintentional.

## 2018-02-21 NOTE — Progress Notes (Signed)
Pharmacy Antibiotic Note  Martin Riddle is a 47 y.o. male admitted on 02/19/2018 with respiratory failure. Patient presenting with acute renal failure with hyperkalemia, Martin start on CRRT.  Pharmacy has been consulted for vancomycin and cefepime dosing with concern for PNA. Patient received one dose of ceftriaxone 2 g in the ED.  Plan: Vancomycin: Scr improved significantly. Martin change Vancomycin dose to 1750 mg IV q8 hours. Martin check Trough level prior to the 4th dose of the regimen.   Continue cefepime 2 g IV q12h  Continue azithromycin 500 mg daily for a total of 5 days of therapy  Height: 6\' 4"  (193 cm) Weight: (!) 313 lb 4.4 oz (142.1 kg) IBW/kg (Calculated) : 86.8  Temp (24hrs), Avg:100.2 F (37.9 C), Min:99 F (37.2 C), Max:100.9 F (38.3 C)  Recent Labs  Lab 02/19/18 1102 02/19/18 1155  02/19/18 1837 02/19/18 2107 02/20/18 0213 02/20/18 0831 02/21/18 0328  WBC  --  19.2*  --   --   --   --   --   --   CREATININE  --  4.16*   < > 3.66* 3.14* 2.32* 1.48* 1.10  LATICACIDVEN 1.6 1.9  --   --   --   --   --   --    < > = values in this interval not displayed.    Estimated Creatinine Clearance: 126.5 mL/min (by C-G formula based on SCr of 1.1 mg/dL).    Allergies  Allergen Reactions  . Morphine And Related Other (See Comments)    headache    Antimicrobials this admission: Ceftriaxone 9/13 x 1 Azithromycin 9/13 >> 9/17 Vancomycin 9/13 >> Cefepime 9/13 >>  Dose adjustments this admission: N/A  Microbiology results: 9/13 BCx: pending 9/13 UCx: pending   9/13 MRSA PCR: pending  Thank you for allowing pharmacy to be a part of this patient's care.  Demetrius Charityeldrin D. Eddison, PharmD  02/21/2018 12:41 PM

## 2018-02-22 ENCOUNTER — Inpatient Hospital Stay: Payer: Medicare Other

## 2018-02-22 ENCOUNTER — Other Ambulatory Visit: Payer: Self-pay

## 2018-02-22 ENCOUNTER — Inpatient Hospital Stay
Admission: RE | Admit: 2018-02-22 | Discharge: 2018-02-22 | Disposition: A | Discharge status: Home / Self Care | Payer: Self-pay | Source: Ambulatory Visit | Attending: Neurosurgery | Admitting: Neurosurgery

## 2018-02-22 ENCOUNTER — Inpatient Hospital Stay: Payer: Self-pay

## 2018-02-22 LAB — PROTEIN ELECTROPHORESIS, SERUM
A/G Ratio: 0.8 (ref 0.7–1.7)
Albumin ELP: 2.8 g/dL — ABNORMAL LOW (ref 2.9–4.4)
Alpha-1-Globulin: 0.6 g/dL — ABNORMAL HIGH (ref 0.0–0.4)
Alpha-2-Globulin: 1.1 g/dL — ABNORMAL HIGH (ref 0.4–1.0)
Beta Globulin: 1 g/dL (ref 0.7–1.3)
Gamma Globulin: 0.7 g/dL (ref 0.4–1.8)
Globulin, Total: 3.3 g/dL (ref 2.2–3.9)
TOTAL PROTEIN ELP: 6.1 g/dL (ref 6.0–8.5)

## 2018-02-22 LAB — BLOOD GAS, ARTERIAL
ACID-BASE EXCESS: 2.3 mmol/L — AB (ref 0.0–2.0)
Bicarbonate: 28.3 mmol/L — ABNORMAL HIGH (ref 20.0–28.0)
FIO2: 0.4
MECHANICAL RATE: 20
O2 SAT: 94.8 %
PCO2 ART: 49 mmHg — AB (ref 32.0–48.0)
PEEP: 5 cmH2O
PH ART: 7.37 (ref 7.350–7.450)
PO2 ART: 77 mmHg — AB (ref 83.0–108.0)
Patient temperature: 37
VT: 550 mL

## 2018-02-22 LAB — CBC
HCT: 30.7 % — ABNORMAL LOW (ref 40.0–52.0)
Hemoglobin: 10.5 g/dL — ABNORMAL LOW (ref 13.0–18.0)
MCH: 32.1 pg (ref 26.0–34.0)
MCHC: 34.2 g/dL (ref 32.0–36.0)
MCV: 93.9 fL (ref 80.0–100.0)
PLATELETS: 155 10*3/uL (ref 150–440)
RBC: 3.27 MIL/uL — ABNORMAL LOW (ref 4.40–5.90)
RDW: 14.4 % (ref 11.5–14.5)
WBC: 9.5 10*3/uL (ref 3.8–10.6)

## 2018-02-22 LAB — BASIC METABOLIC PANEL
ANION GAP: 7 (ref 5–15)
BUN: 46 mg/dL — ABNORMAL HIGH (ref 6–20)
CO2: 30 mmol/L (ref 22–32)
Calcium: 9 mg/dL (ref 8.9–10.3)
Chloride: 105 mmol/L (ref 98–111)
Creatinine, Ser: 1.1 mg/dL (ref 0.61–1.24)
GFR calc Af Amer: >60 mL/min (ref >60)
GLUCOSE: 145 mg/dL — AB (ref 70–99)
POTASSIUM: 5.7 mmol/L — AB (ref 3.5–5.1)
Sodium: 142 mmol/L (ref 135–145)

## 2018-02-22 LAB — COMPREHENSIVE METABOLIC PANEL
ALT: 19 U/L (ref 0–44)
AST: 29 U/L (ref 15–41)
Albumin: 2.8 g/dL — ABNORMAL LOW (ref 3.5–5.0)
Alkaline Phosphatase: 58 U/L (ref 38–126)
Anion gap: 5 (ref 5–15)
BUN: 43 mg/dL — AB (ref 6–20)
CHLORIDE: 105 mmol/L (ref 98–111)
CO2: 32 mmol/L (ref 22–32)
Calcium: 8.9 mg/dL (ref 8.9–10.3)
Creatinine, Ser: 1.03 mg/dL (ref 0.61–1.24)
GFR calc Af Amer: >60 mL/min (ref >60)
Glucose, Bld: 181 mg/dL — ABNORMAL HIGH (ref 70–99)
POTASSIUM: 5.3 mmol/L — AB (ref 3.5–5.1)
SODIUM: 142 mmol/L (ref 135–145)
Total Bilirubin: 0.7 mg/dL (ref 0.3–1.2)
Total Protein: 6.6 g/dL (ref 6.5–8.1)

## 2018-02-22 LAB — GLUCOSE, CAPILLARY
GLUCOSE-CAPILLARY: 195 mg/dL — AB (ref 70–99)
GLUCOSE-CAPILLARY: 137 mg/dL — AB (ref 70–99)
GLUCOSE-CAPILLARY: 169 mg/dL — AB (ref 70–99)
GLUCOSE-CAPILLARY: 236 mg/dL — AB (ref 70–99)
Glucose-Capillary: 158 mg/dL — ABNORMAL HIGH (ref 70–99)
Glucose-Capillary: 263 mg/dL — ABNORMAL HIGH (ref 70–99)

## 2018-02-22 LAB — PROCALCITONIN: PROCALCITONIN: 2.52 ng/mL

## 2018-02-22 LAB — MPO/PR-3 (ANCA) ANTIBODIES
ANCA PROTEINASE 3: 6.7 U/mL — AB (ref 0.0–3.5)
Myeloperoxidase Abs: 10.1 U/mL — ABNORMAL HIGH (ref 0.0–9.0)

## 2018-02-22 LAB — GLOMERULAR BASEMENT MEMBRANE ANTIBODIES: GBM AB: 4 U (ref 0–20)

## 2018-02-22 MED ORDER — LORAZEPAM 2 MG/ML IJ SOLN
4.0000 mg | Freq: Once | INTRAMUSCULAR | Status: AC
  Administered 2018-02-22: 4 mg via INTRAVENOUS

## 2018-02-22 MED ORDER — VECURONIUM BROMIDE 10 MG IV SOLR
INTRAVENOUS | Status: AC
  Administered 2018-02-22: 10 mg via INTRAVENOUS
  Filled 2018-02-22: qty 10

## 2018-02-22 MED ORDER — VITAL HIGH PROTEIN PO LIQD
1000.0000 mL | ORAL | Status: DC
  Administered 2018-02-22: 1000 mL

## 2018-02-22 MED ORDER — STERILE WATER FOR INJECTION IJ SOLN
INTRAMUSCULAR | Status: AC
  Administered 2018-02-22: 10 mL
  Filled 2018-02-22: qty 10

## 2018-02-22 MED ORDER — PRO-STAT SUGAR FREE PO LIQD
60.0000 mL | Freq: Three times a day (TID) | ORAL | Status: DC
  Administered 2018-02-22 – 2018-02-23 (×3): 60 mL

## 2018-02-22 MED ORDER — FLEET ENEMA 7-19 GM/118ML RE ENEM: 1.0000 | ENEMA | Freq: Every day | RECTAL | Status: DC | PRN

## 2018-02-22 MED ORDER — FENTANYL BOLUS VIA INFUSION
100.0000 ug | Freq: Once | INTRAVENOUS | Status: AC
  Administered 2018-02-22: 100 ug via INTRAVENOUS
  Filled 2018-02-22: qty 100

## 2018-02-22 MED ORDER — LORAZEPAM 2 MG/ML IJ SOLN
INTRAMUSCULAR | Status: AC
  Filled 2018-02-22: qty 2

## 2018-02-22 MED ORDER — BISACODYL 10 MG RE SUPP
10.0000 mg | Freq: Every day | RECTAL | Status: DC | PRN
  Administered 2018-02-25: 10 mg via RECTAL
  Filled 2018-02-22: qty 1

## 2018-02-22 MED ORDER — BUDESONIDE 0.25 MG/2ML IN SUSP
0.2500 mg | Freq: Once | RESPIRATORY_TRACT | Status: DC
  Filled 2018-02-22: qty 2

## 2018-02-22 MED ORDER — INSULIN DETEMIR 100 UNIT/ML ~~LOC~~ SOLN
10.0000 [IU] | Freq: Every day | SUBCUTANEOUS | Status: DC
  Administered 2018-02-23: 10 [IU] via SUBCUTANEOUS
  Filled 2018-02-22 (×6): qty 0.1

## 2018-02-22 MED ORDER — DEXTROSE 50 % IV SOLN
50.0000 mL | Freq: Once | INTRAVENOUS | Status: AC
  Administered 2018-02-22: 50 mL via INTRAVENOUS
  Filled 2018-02-22: qty 50

## 2018-02-22 MED ORDER — BUDESONIDE 0.25 MG/2ML IN SUSP
0.2500 mg | Freq: Two times a day (BID) | RESPIRATORY_TRACT | Status: DC
  Administered 2018-02-22 – 2018-02-28 (×13): 0.25 mg via RESPIRATORY_TRACT
  Filled 2018-02-22 (×12): qty 2

## 2018-02-22 MED ORDER — METHYLPREDNISOLONE SODIUM SUCC 40 MG IJ SOLR
40.0000 mg | Freq: Two times a day (BID) | INTRAMUSCULAR | Status: DC
  Administered 2018-02-22 – 2018-02-25 (×7): 40 mg via INTRAVENOUS
  Filled 2018-02-22 (×8): qty 1

## 2018-02-22 MED ORDER — INSULIN ASPART 100 UNIT/ML ~~LOC~~ SOLN
0.0000 [IU] | SUBCUTANEOUS | Status: DC
  Administered 2018-02-22: 5 [IU] via SUBCUTANEOUS
  Administered 2018-02-23: 7 [IU] via SUBCUTANEOUS
  Filled 2018-02-22 (×2): qty 1

## 2018-02-22 MED ORDER — FENTANYL BOLUS VIA INFUSION
100.0000 ug | Freq: Once | INTRAVENOUS | Status: AC
  Administered 2018-02-22: 100 ug via INTRAVENOUS

## 2018-02-22 MED ORDER — ADULT MULTIVITAMIN LIQUID CH
15.0000 mL | Freq: Every day | ORAL | Status: DC
  Administered 2018-02-22 – 2018-02-26 (×5): 15 mL
  Filled 2018-02-22 (×6): qty 15

## 2018-02-22 MED ORDER — PROPOFOL 1000 MG/100ML IV EMUL
5.0000 ug/kg/min | INTRAVENOUS | Status: DC
  Administered 2018-02-22 (×3): 80 ug/kg/min via INTRAVENOUS
  Administered 2018-02-22: 40 ug/kg/min via INTRAVENOUS
  Administered 2018-02-22 – 2018-02-23 (×10): 80 ug/kg/min via INTRAVENOUS
  Administered 2018-02-23: 75 ug/kg/min via INTRAVENOUS
  Administered 2018-02-23: 70 ug/kg/min via INTRAVENOUS
  Administered 2018-02-23 (×5): 80 ug/kg/min via INTRAVENOUS
  Administered 2018-02-24 (×3): 70 ug/kg/min via INTRAVENOUS
  Administered 2018-02-24: 75 ug/kg/min via INTRAVENOUS
  Administered 2018-02-24 (×2): 70 ug/kg/min via INTRAVENOUS
  Administered 2018-02-24: 75 ug/kg/min via INTRAVENOUS
  Administered 2018-02-24 (×5): 70 ug/kg/min via INTRAVENOUS
  Administered 2018-02-25 (×3): 80 ug/kg/min via INTRAVENOUS
  Administered 2018-02-25: 70 ug/kg/min via INTRAVENOUS
  Administered 2018-02-25: 80 ug/kg/min via INTRAVENOUS
  Administered 2018-02-25: 70 ug/kg/min via INTRAVENOUS
  Filled 2018-02-22 (×39): qty 100

## 2018-02-22 MED ORDER — PROPOFOL 1000 MG/100ML IV EMUL
INTRAVENOUS | Status: AC
  Administered 2018-02-22: 40 ug/kg/min via INTRAVENOUS
  Filled 2018-02-22: qty 100

## 2018-02-22 MED ORDER — INSULIN REGULAR HUMAN 100 UNIT/ML IJ SOLN
10.0000 [IU] | Freq: Once | INTRAMUSCULAR | Status: AC
  Administered 2018-02-22: 10 [IU] via INTRAVENOUS
  Filled 2018-02-22: qty 0.1

## 2018-02-22 MED ORDER — VECURONIUM BROMIDE 10 MG IV SOLR
10.0000 mg | Freq: Once | INTRAVENOUS | Status: AC
  Administered 2018-02-22: 10 mg via INTRAVENOUS

## 2018-02-22 MED ORDER — MIDAZOLAM BOLUS VIA INFUSION
4.0000 mg | Freq: Once | INTRAVENOUS | Status: AC
  Administered 2018-02-22: 4 mg via INTRAVENOUS

## 2018-02-22 MED ORDER — IPRATROPIUM-ALBUTEROL 0.5-2.5 (3) MG/3ML IN SOLN
3.0000 mL | RESPIRATORY_TRACT | Status: DC
  Administered 2018-02-22 – 2018-02-26 (×24): 3 mL via RESPIRATORY_TRACT
  Filled 2018-02-22 (×25): qty 3

## 2018-02-22 NOTE — Progress Notes (Signed)
[facility]  ROUNDING NOTE   Subjective:   Tmax 101.3  UOP 2050 - foley  Critically ill  Objective:  Vital signs in last 24 hours:  Temp:  [99.7 F (37.6 C)-101.3 F (38.5 C)] 99.9 F (37.7 C) (09/16 0700) Pulse Rate:  [54-107] 61 (09/16 0700) Resp:  [16-33] 23 (09/16 0700) BP: (88-117)/(50-72) 117/65 (09/16 0700) SpO2:  [91 %-100 %] 95 % (09/16 0839) FiO2 (%):  [45 %-50 %] 50 % (09/16 0839) Weight:  [142.2 kg] 142.2 kg (09/16 0221)  Weight change: 0.1 kg Filed Weights   02/20/18 0322 02/21/18 0327 02/22/18 0221  Weight: (!) 144.7 kg (!) 142.1 kg (!) 142.2 kg    Intake/Output: I/O last 3 completed shifts: In: 4963.5 [I.V.:2603.1; NG/GT:60; IV Piggyback:2300.5] Out: 6256 [Urine:5065; Emesis/NG output:600]   Intake/Output this shift:  No intake/output data recorded.  Physical Exam: General: Critically ill   Head: ETT, OGT  Eyes: Anicteric  Neck: No JVD  Lungs:  Scattered rhonchi, PRVC FIO2   Heart: regular  Abdomen:  Soft, nontender   Extremities: 1+ peripheral edema.  Neurologic: Intubated/sedated  Skin: No lesions  Access: Right femoral dialysis catheter    Basic Metabolic Panel: Recent Labs  Lab 02/19/18 1642 02/19/18 1837 02/19/18 2107 02/20/18 0213 02/20/18 0831 02/21/18 0328 02/21/18 1436 02/21/18 2348 02/22/18 0419  NA 131*  132* 133* 134* 136 139 141  --  142 142  K 6.6*  6.6* 5.5* 5.1 4.7 4.8 5.3* 5.7* 5.7* 5.3*  CL 99  100 101 102 104 104 103  --  105 105  CO2 21*  21* 21* 19* 23 28 32  --  30 32  GLUCOSE 170*  176* 158* 172* 165* 136* 149*  --  145* 181*  BUN 92*  97* 91* 82* 75* 58* 38*  --  46* 43*  CREATININE 4.06*  3.97* 3.66* 3.14* 2.32* 1.48* 1.10  --  1.10 1.03  CALCIUM 7.6*  7.8* 7.7* 7.9* 8.3* 8.7* 9.0  --  9.0 8.9  MG 1.9 2.0 2.1 2.1 2.2  --   --   --   --   PHOS 7.9* 7.3* 6.3* 5.0* 3.6  --   --   --   --     Liver Function Tests: Recent Labs  Lab 02/19/18 1155  02/19/18 1837 02/19/18 2107  02/20/18 0213 02/20/18 0831 02/22/18 0419  AST 23  --   --   --   --   --  29  ALT 21  --   --   --   --   --  19  ALKPHOS 94  --   --   --   --   --  58  BILITOT 1.2  --   --   --   --   --  0.7  PROT 7.8  --   --   --   --   --  6.6  ALBUMIN 3.4*   < > 3.2* 3.1* 3.2* 3.0* 2.8*   < > = values in this interval not displayed.   No results for input(s): LIPASE, AMYLASE in the last 168 hours. No results for input(s): AMMONIA in the last 168 hours.  CBC: Recent Labs  Lab 02/19/18 1155 02/22/18 0419  WBC 19.2* 9.5  NEUTROABS 16.5*  --   HGB 12.9* 10.5*  HCT 39.2* 30.7*  MCV 94.7 93.9  PLT 154 155    Cardiac Enzymes: Recent Labs  Lab 02/19/18 1155 02/19/18 1642 02/19/18 2107  02/20/18 0213  TROPONINI 0.05* 0.12* 0.09* 0.08*    BNP: Invalid input(s): POCBNP  CBG: Recent Labs  Lab 02/21/18 1536 02/21/18 1936 02/21/18 2322 02/22/18 0333 02/22/18 0743  GLUCAP 164* 165* 137* 195* 158*    Microbiology: Results for orders placed or performed during the hospital encounter of 02/19/18  Blood culture (routine x 2)     Status: None (Preliminary result)   Collection Time: 02/19/18 11:02 AM  Result Value Ref Range Status   Specimen Description BLOOD LEFT ANTECUBITAL  Final   Special Requests   Final    BOTTLES DRAWN AEROBIC AND ANAEROBIC Blood Culture results may not be optimal due to an inadequate volume of blood received in culture bottles   Culture   Final    NO GROWTH 3 DAYS Performed at [facility], [address] [place], [place] [address]    Report Status PENDING  Incomplete  Urine culture     Status: None   Collection Time: 02/19/18 11:36 AM  Result Value Ref Range Status   Specimen Description   Final    URINE, CLEAN CATCH Performed at [facility], [address] [place], [place] [address]    Special Requests   Final    NONE Performed at [facility], [address] [place], [place] [address]    Culture   Final    NO  GROWTH Performed at [facility], [place] [address], [place], [place] [address]    Report Status 02/20/2018 FINAL  Final  Blood culture (routine x 2)     Status: None (Preliminary result)   Collection Time: 02/19/18 11:55 AM  Result Value Ref Range Status   Specimen Description BLOOD RIGHT ANTECUBITAL  Final   Special Requests   Final    BOTTLES DRAWN AEROBIC AND ANAEROBIC Blood Culture adequate volume   Culture   Final    NO GROWTH 3 DAYS Performed at [facility], [address] [place], [place] [address]    Report Status PENDING  Incomplete  MRSA PCR Screening     Status: None   Collection Time: 02/19/18  2:31 PM  Result Value Ref Range Status   MRSA by PCR NEGATIVE NEGATIVE Final    Comment:        The GeneXpert MRSA Assay (FDA approved for NASAL specimens only), is one component of a comprehensive MRSA colonization surveillance program. It is not intended to diagnose MRSA infection nor to guide or monitor treatment for MRSA infections. Performed at [facility], [address] [place],  [address]     Coagulation Studies: No results for input(s): LABPROT, INR in the last 72 hours.  Urinalysis: Recent Labs    02/19/18 1136  COLORURINE YELLOW*  LABSPEC 1.014  PHURINE 5.0  GLUCOSEU NEGATIVE  HGBUR MODERATE*  BILIRUBINUR NEGATIVE  KETONESUR NEGATIVE  PROTEINUR 30*  NITRITE NEGATIVE  LEUKOCYTESUR NEGATIVE      Imaging: Dg Chest Port 1 View  Result Date: 02/22/2018 CLINICAL DATA:  Status post intubation EXAM: PORTABLE CHEST 1 VIEW COMPARISON:  Film from earlier in the same day. FINDINGS: Endotracheal tube and nasogastric catheter are noted in satisfactory position. Cardiac shadow is stable. The lungs are well aerated without focal infiltrate. Previously seen left basilar changes have resolved in the interval. IMPRESSION: Tubes and lines as described. Improved aeration in the left base. Electronically Signed   By: Inez Catalina M.D.    On: 02/22/2018 09:12   Dg Chest Lifescape  Result Date: 02/22/2018 CLINICAL DATA:  Hypoxia EXAM: PORTABLE CHEST 1 VIEW COMPARISON:  February 21, 2018 FINDINGS: Endotracheal tube tip is 6.1 cm above the carina. Nasogastric tube tip and side port are below the diaphragm. No pneumothorax. There is atelectatic change in the left base. There is also consolidation in the medial left base. Lungs elsewhere clear. Heart is slightly enlarged with pulmonary vascularity normal. No adenopathy. No bone lesions. Thoracic stimulator tip at the level of T11, stable. IMPRESSION: Tube positions as described without pneumothorax. Atelectatic change in the left base with consolidation in the left base more medially. Pneumonia in the medial left base cannot be excluded. The right lung is clear. Stable cardiac silhouette. Electronically Signed   By: Lowella Grip III M.D.   On: 02/22/2018 07:16   Dg Chest Port 1 View  Result Date: 02/21/2018 CLINICAL DATA:  Acute respiratory failure EXAM: PORTABLE CHEST 1 VIEW COMPARISON:  02/19/2018 FINDINGS: Endotracheal tube terminates 4.5 cm above the carina. Enteric tube coursing below the diaphragm. Lungs are essentially clear. No focal consolidation. No pleural effusion or pneumothorax. The heart is normal in size. IMPRESSION: Endotracheal tube terminates 4.5 cm above the carina. Electronically Signed   By: Julian Hy M.D.   On: 02/21/2018 07:28     Medications:   . azithromycin Stopped (02/21/18 1118)  . ceFEPime (MAXIPIME) IV Stopped (02/22/18 0336)  . dexmedetomidine (PRECEDEX) IV infusion 1.4 mcg/kg/hr (02/22/18 0754)  . famotidine (PEPCID) IV Stopped (02/21/18 2221)  . fentaNYL infusion INTRAVENOUS 400 mcg/hr (02/22/18 0732)  . midazolam (VERSED) infusion 5 mg/hr (02/22/18 0700)  . norepinephrine (LEVOPHED) Adult infusion Stopped (02/21/18 0806)  . vancomycin Stopped (02/22/18 0542)   . chlorhexidine gluconate (MEDLINE KIT)  15 mL Mouth Rinse BID  .  dextrose  25 g Intravenous Once  . heparin injection (subcutaneous)  5,000 Units Subcutaneous Q8H  . insulin aspart  10 Units Intravenous Once  . insulin aspart  2-6 Units Subcutaneous Q4H  . ipratropium-albuterol  3 mL Nebulization Q6H  . mouth rinse  15 mL Mouth Rinse 10 times per day   acetaminophen, bisacodyl, heparin, ibuprofen, midazolam, sodium phosphate  Assessment/ Plan:  47 y.o. white male with diabetes mellitus type 2, hypertension, hyperlipidemia, pancreatitis, chronic back pain status post implantable spinal cord stimulator, chronic narcotic use, who was admitted to [facility] on 02/19/2018 for evaluation of decreased level of consciousness and generalized weakness.   1.  Acute renal failure, baseline creatinine 1.2 with a EGFR 65.  Acute renal failure suspected secondary to hypotension and acute tubular necrosis. 2.  Hyperkalemia 3.  Acute respiratory failure. 4.  Hyponatremia, likely hypovolemic. 5.  Acidosis, mixed.  Plan: No acute indication for dialysis Nonoliguric urine output Potassium improving   LOS: 3 Martin Riddle 9/16/20199:53 AM

## 2018-02-22 NOTE — Progress Notes (Signed)
CRITICAL CARE NOTE  CC  follow up respiratory failure  SUBJECTIVE Patient remains critically ill Prognosis is guarded On vent Severe resp failure     SIGNIFICANT EVENTS    BP 117/65   Pulse 61   Temp 99.9 F (37.7 C)   Resp (!) 23   Ht _0  (1.93 m)   Wt (!) 142.2 kg   SpO2 96%   BMI 38.16 kg/m    REVIEW OF SYSTEMS  PATIENT IS UNABLE TO PROVIDE COMPLETE REVIEW OF SYSTEM S DUE TO SEVERE CRITICAL ILLNESS   PHYSICAL EXAMINATION:  GENERAL:critically ill appearing, +resp distress HEAD: Normocephalic, atraumatic.  EYES: Pupils equal, round, reactive to light.  No scleral icterus.  MOUTH: Moist mucosal membrane. NECK: Supple. No thyromegaly. No nodules. No JVD.  PULMONARY: +rhonchi, +wheezing CARDIOVASCULAR: S1 and S2. Regular rate and rhythm. No murmurs, rubs, or gallops.  GASTROINTESTINAL: Soft, nontender, -distended. No masses. Positive bowel sounds. No hepatosplenomegaly.  MUSCULOSKELETAL: No swelling, clubbing, or edema.  NEUROLOGIC: obtunded, GCS<8 SKIN:intact,warm,dry  ASSESSMENT AND PLAN 47 yo male with severe resp failure from pneumonia complicated by acute renal failure on  Vent support s/p CRRT   Severe Hypoxic and Hypercapnic Respiratory Failure -continue Full MV support -continue Bronchodilator Therapy -Wean Fio2 and PEEP as tolerated -will perform SAT/SBT when respiratory parameters are met    Renal Failure-most likely due to ATN -follow chem 7 -follow UO -continue Foley Catheter-assess need daily Follow up Nephrology recs  NEUROLOGY - intubated and sedated - minimal sedation to achieve a RASS goal: -1 Wake up assessment pending    CARDIAC ICU monitoring  ID Pneumonia: Cefepime and vancomycin Follow-up on blood cultures  ENDO - ICU hypoglycemic\Hyperglycemia protocol -check FSBS per protocol   ELECTROLYTES -follow labs as needed -replace as needed -pharmacy consultation and following   DVT/GI PRX ordered TRANSFUSIONS  AS NEEDED MONITOR FSBS ASSESS the need for LABS as needed   Critical Care Time devoted to patient care services described in this note is 34 minutes.   Overall, patient is critically ill, prognosis is guarded.  Patient with Multiorgan failure and at high risk for cardiac arrest and death.    Corrin Parker, M.D.  [facility]  Medical Director [place] Director [facility] Cardio-Pulmonary Department

## 2018-02-22 NOTE — Progress Notes (Signed)
Peripherally Inserted Central Catheter/Midline Placement  The IV Nurse has discussed with the patient and/or persons authorized to consent for the patient, the purpose of this procedure and the potential benefits and risks involved with this procedure.  The benefits include less needle sticks, lab draws from the catheter, and the patient may be discharged home with the catheter. Risks include, but not limited to, infection, bleeding, blood clot (thrombus formation), and puncture of an artery; nerve damage and irregular heartbeat and possibility to perform a PICC exchange if needed/ordered by physician.  Alternatives to this procedure were also discussed.  Bard Power PICC patient education guide, fact sheet on infection prevention and patient information card has been provided to patient /or left at bedside.    PICC/Midline Placement Documentation  PICC Triple Lumen 02/22/18 PICC Brachial 43 cm 0 cm (Active)  Indication for Insertion or Continuance of Line Prolonged intravenous therapies 02/22/2018  2:18 PM  Exposed Catheter (cm) 0 cm 02/22/2018  2:18 PM  Site Assessment Dry;Clean;Intact 02/22/2018  2:18 PM  Lumen #1 Status Flushed;Blood return noted;Saline locked 02/22/2018  2:18 PM  Lumen #2 Status Flushed;Blood return noted;Saline locked 02/22/2018  2:18 PM  Lumen #3 Status Flushed;Blood return noted;Saline locked 02/22/2018  2:18 PM  Dressing Type Transparent 02/22/2018  2:18 PM  Dressing Status Clean;Dry;Intact 02/22/2018  2:18 PM  Dressing Change Due 03/01/18 02/22/2018  2:18 PM       Audrie GallusByerly, Carel Schnee Ramos 02/22/2018, 2:22 PM

## 2018-02-22 NOTE — Discharge Instructions (Signed)
Myelogram Discharge Instructions  1. Go home and rest quietly for the next 24 hours.  It is important to lie flat for the next 24 hours.  Get up only to go to the restroom.  You may lie in the bed or on a couch on your back, your stomach, your left side or your right side.  You may have one pillow under your head.  You may have pillows between your knees while you are on your side or under your knees while you are on your back.  2. DO NOT drive today.  Recline the seat as far back as it will go, while still wearing your seat belt, on the way home.  3. You may get up to go to the bathroom as needed.  You may sit up for 10 minutes to eat.  You may resume your normal diet and medications unless otherwise indicated.  Drink lots of extra fluids today and tomorrow.  4. The incidence of headache, nausea, or vomiting is about 5% (one in 20 patients).  If you develop a headache, lie flat and drink plenty of fluids until the headache goes away.  Caffeinated beverages may be helpful.  If you develop severe nausea and vomiting or a headache that does not go away with flat bed rest, call [phone].  5. You may resume normal activities after your 24 hours of bed rest is over; however, do not exert yourself strongly or do any heavy lifting tomorrow. If when you get up you have a headache when standing, go back to bed and force fluids for another 24 hours.  6. Call your physician for a follow-up appointment.  The results of your myelogram will be sent directly to your physician by the following day.  7. If you have any questions or if complications develop after you arrive home, please call [phone].  Discharge instructions have been explained to the patient.  The patient, or the person responsible for the patient, fully understands these instructions.  YOU MAY RESTART YOUR CYMBALTA AND TRAZODONE TOMORROW 02/23/2018 AT 1:00PM.

## 2018-02-22 NOTE — Care Management Note (Signed)
Case Management Note  Patient Details  Name: Martin Riddle MRN: [id number] Date of Birth: 11/19/1970  Subjective/Objective:                 Patient admitted from home with diagnosis of pneumonia.  Required intubation and is on mechanical vent.  If patient does not get extubated today, Martin start tube feedings.  He is following commands and Martin attempt to wake up assessment and spontaneous breathing trials.  Does not have oxygen in the home. Failed outpatient management of sx. has pcp and independent in his adls prior to this admission.     Action/Plan:   Expected Discharge Date:                  Expected Discharge Plan:     In-House Referral:     Discharge planning Services     Post Acute Care Choice:    Choice offered to:     DME Arranged:    DME Agency:     HH Arranged:    HH Agency:     Status of Service:     If discussed at [employer] Length of Stay Meetings, dates discussed:    Additional Comments:  Eber HongGreene, Paulla Mcclaskey R, RN 02/22/2018, 3:03 PM

## 2018-02-22 NOTE — Progress Notes (Signed)
[facility]   PATIENT NAME: Martin Riddle    MR#:  [id number]  DATE OF BIRTH:  01/23/1971  SUBJECTIVE:  CHIEF COMPLAINT:   Chief Complaint  Patient presents with  . Weakness   Brought with altered mental status and noted to have respiratory failure and intubated.  Septic shock with multiorgan failure and required dialysis initially.  Blood pressure stable. Renal function improved, have good urine output.  Failed SBT.  REVIEW OF SYSTEMS:   Patient is intubated and sedated.  ROS  DRUG ALLERGIES:   Allergies  Allergen Reactions  . Morphine And Related Other (See Comments)    headache    VITALS:  Blood pressure 134/72, pulse (!) 59, temperature 99.9 F (37.7 C), resp. rate (!) 29, height 6\' 4"  (1.93 m), weight (!) 142.2 kg, SpO2 94 %.  PHYSICAL EXAMINATION:  GENERAL:  47 y.o.-year-old patient lying in the bed with no acute distress.  EYES: Pupils equal, round, reactive to light and accommodation. No scleral icterus. Extraocular muscles intact.  HEENT: Head atraumatic, normocephalic. Oropharynx and nasopharynx clear.  NECK:  Supple, no jugular venous distention. No thyroid enlargement, no tenderness.  LUNGS: Normal breath sounds bilaterally, no wheezing, rales,rhonchi or crepitation. No use of accessory muscles of respiration.  CARDIOVASCULAR: S1, S2 normal. No murmurs, rubs, or gallops.  ABDOMEN: Soft, nontender, nondistended. Bowel sounds present. No organomegaly or mass.  EXTREMITIES: No pedal edema, cyanosis, or clubbing.  NEUROLOGIC: Patient is sedated and intubated currently. PSYCHIATRIC: Sedated and intubated.  SKIN: No obvious rash, lesion, or ulcer.   Physical Exam LABORATORY PANEL:   CBC Recent Labs  Lab 02/22/18 0419  WBC 9.5  HGB 10.5*  HCT 30.7*  PLT 155   ------------------------------------------------------------------------------------------------------------------  Chemistries  Recent Labs  Lab  02/20/18 0831  02/22/18 0419  NA 139   < > 142  K 4.8   < > 5.3*  CL 104   < > 105  CO2 28   < > 32  GLUCOSE 136*   < > 181*  BUN 58*   < > 43*  CREATININE 1.48*   < > 1.03  CALCIUM 8.7*   < > 8.9  MG 2.2  --   --   AST  --   --  29  ALT  --   --  19  ALKPHOS  --   --  58  BILITOT  --   --  0.7   < > = values in this interval not displayed.   ------------------------------------------------------------------------------------------------------------------  Cardiac Enzymes Recent Labs  Lab 02/19/18 2107 02/20/18 0213  TROPONINI 0.09* 0.08*   ------------------------------------------------------------------------------------------------------------------  RADIOLOGY:  Dg Chest Port 1 View  Result Date: 02/22/2018 CLINICAL DATA:  Status post intubation EXAM: PORTABLE CHEST 1 VIEW COMPARISON:  Film from earlier in the same day. FINDINGS: Endotracheal tube and nasogastric catheter are noted in satisfactory position. Cardiac shadow is stable. The lungs are well aerated without focal infiltrate. Previously seen left basilar changes have resolved in the interval. IMPRESSION: Tubes and lines as described. Improved aeration in the left base. Electronically Signed   By: Alcide CleverMark  Lukens M.D.   On: 02/22/2018 09:12   Dg Chest Port 1 View  Result Date: 02/22/2018 CLINICAL DATA:  Hypoxia EXAM: PORTABLE CHEST 1 VIEW COMPARISON:  February 21, 2018 FINDINGS: Endotracheal tube tip is 6.1 cm above the carina. Nasogastric tube tip and side port are below the diaphragm. No pneumothorax. There is atelectatic change in the left base.  There is also consolidation in the medial left base. Lungs elsewhere clear. Heart is slightly enlarged with pulmonary vascularity normal. No adenopathy. No bone lesions. Thoracic stimulator tip at the level of T11, stable. IMPRESSION: Tube positions as described without pneumothorax. Atelectatic change in the left base with consolidation in the left base more medially.  Pneumonia in the medial left base cannot be excluded. The right lung is clear. Stable cardiac silhouette. Electronically Signed   By: Bretta Bang III M.D.   On: 02/22/2018 07:16   Dg Chest Port 1 View  Result Date: 02/21/2018 CLINICAL DATA:  Acute respiratory failure EXAM: PORTABLE CHEST 1 VIEW COMPARISON:  02/19/2018 FINDINGS: Endotracheal tube terminates 4.5 cm above the carina. Enteric tube coursing below the diaphragm. Lungs are essentially clear. No focal consolidation. No pleural effusion or pneumothorax. The heart is normal in size. IMPRESSION: Endotracheal tube terminates 4.5 cm above the carina. Electronically Signed   By: Charline Bills M.D.   On: 02/21/2018 07:28   [place] Ekg Site Rite  Result Date: 02/22/2018 If Site Rite image not attached, placement could not be confirmed due to current cardiac rhythm.   ASSESSMENT AND PLAN:   Active Problems:   Respiratory failure (HCC)  47 year old male with history of hypertension, chronic back pain on chronic narcotics with back stimulator presents to the emergency room with 2 days of generalized weakness and unresponsiveness.  1.  Sepsis with acute hypoxic respiratory failure in the setting of pneumonia: Patient presents with tachypnea, leukocytosis and tachycardia S/p intubation Management as per intencivist.  2.  Pneumonia: Cefepime and vancomycin Follow-up on blood cultures  3.  Hyperkalemia: This was treated in the emergency room.  This is due to acute kidney injury. CRRT with help of nephrology.  4.  Acute kidney injury in the setting of poor p.o. intake and sepsis Nephrology consultation Dialysis done. Now nephrologist is just monitoring renal function, it is improving and patient had good urine output.  5.  Hyponatremia from dehydration  6.  Elevated troponin: This is likely due to sepsis  echocardiogram  7.  Diabetes: Monitor as per ICU protocol   All the records are reviewed and case discussed with Care  Management/Social Workerr. Management plans discussed with the patient, family and they are in agreement.  CODE STATUS: full.  TOTAL TIME TAKING CARE OF THIS PATIENT: 35 minutes.    POSSIBLE D/C IN 2-3 DAYS, DEPENDING ON CLINICAL CONDITION.   Altamese Dilling M.D on 02/22/2018   Between 7am to 6pm - Pager - [phone]  After 6pm go to [email or web address] - Social research officer, government  [facility]  Office  [phone]  CC: Primary care physician; Lauro Regulus, MD  Note: This dictation was prepared with Dragon dictation along with smaller phrase technology. Any transcriptional errors that result from this process are unintentional.

## 2018-02-23 ENCOUNTER — Inpatient Hospital Stay: Payer: Medicare Other

## 2018-02-23 DIAGNOSIS — J441 Chronic obstructive pulmonary disease with (acute) exacerbation: Secondary | ICD-10-CM

## 2018-02-23 LAB — BASIC METABOLIC PANEL
ANION GAP: 9 (ref 5–15)
BUN: 46 mg/dL — ABNORMAL HIGH (ref 6–20)
CALCIUM: 9.1 mg/dL (ref 8.9–10.3)
CO2: 27 mmol/L (ref 22–32)
Chloride: 102 mmol/L (ref 98–111)
Creatinine, Ser: 0.91 mg/dL (ref 0.61–1.24)
GFR calc Af Amer: >60 mL/min (ref >60)
Glucose, Bld: 363 mg/dL — ABNORMAL HIGH (ref 70–99)
Potassium: 6.3 mmol/L (ref 3.5–5.1)
SODIUM: 138 mmol/L (ref 135–145)

## 2018-02-23 LAB — CBC WITH DIFFERENTIAL/PLATELET
BLASTS: 0 %
Band Neutrophils: 0 %
Basophils Absolute: 0 10*3/uL (ref 0–0.1)
Basophils Relative: 0 %
EOS ABS: 0 10*3/uL (ref 0–0.7)
Eosinophils Relative: 0 %
HCT: 32.5 % — ABNORMAL LOW (ref 40.0–52.0)
Hemoglobin: 11.1 g/dL — ABNORMAL LOW (ref 13.0–18.0)
LYMPHS PCT: 10 %
Lymphs Abs: 0.9 10*3/uL — ABNORMAL LOW (ref 1.0–3.6)
MCH: 31.8 pg (ref 26.0–34.0)
MCHC: 34 g/dL (ref 32.0–36.0)
MCV: 93.6 fL (ref 80.0–100.0)
Metamyelocytes Relative: 1 %
Monocytes Absolute: 0.2 10*3/uL (ref 0.2–1.0)
Monocytes Relative: 2 %
Myelocytes: 2 %
NRBC: 0 /100{WBCs}
Neutro Abs: 7.8 10*3/uL — ABNORMAL HIGH (ref 1.4–6.5)
Neutrophils Relative %: 85 %
OTHER: 0 %
PROMYELOCYTES RELATIVE: 0 %
Platelets: 176 10*3/uL (ref 150–440)
RBC: 3.48 MIL/uL — ABNORMAL LOW (ref 4.40–5.90)
RDW: 13.9 % (ref 11.5–14.5)
WBC: 8.9 10*3/uL (ref 3.8–10.6)

## 2018-02-23 LAB — TRIGLYCERIDES: TRIGLYCERIDES: 571 mg/dL — AB (ref <150)

## 2018-02-23 LAB — MAGNESIUM: Magnesium: 2 mg/dL (ref 1.7–2.4)

## 2018-02-23 LAB — GLUCOSE, CAPILLARY
GLUCOSE-CAPILLARY: 319 mg/dL — AB (ref 70–99)
GLUCOSE-CAPILLARY: 353 mg/dL — AB (ref 70–99)
GLUCOSE-CAPILLARY: 313 mg/dL — AB (ref 70–99)
GLUCOSE-CAPILLARY: 342 mg/dL — AB (ref 70–99)
GLUCOSE-CAPILLARY: 290 mg/dL — AB (ref 70–99)
GLUCOSE-CAPILLARY: 231 mg/dL — AB (ref 70–99)
Glucose-Capillary: 324 mg/dL — ABNORMAL HIGH (ref 70–99)
Glucose-Capillary: 334 mg/dL — ABNORMAL HIGH (ref 70–99)
Glucose-Capillary: 328 mg/dL — ABNORMAL HIGH (ref 70–99)
Glucose-Capillary: 375 mg/dL — ABNORMAL HIGH (ref 70–99)
Glucose-Capillary: 255 mg/dL — ABNORMAL HIGH (ref 70–99)
Glucose-Capillary: 244 mg/dL — ABNORMAL HIGH (ref 70–99)
Glucose-Capillary: 218 mg/dL — ABNORMAL HIGH (ref 70–99)

## 2018-02-23 LAB — URINALYSIS, COMPLETE (UACMP) WITH MICROSCOPIC
Bacteria, UA: NONE SEEN
Bilirubin Urine: NEGATIVE
HGB URINE DIPSTICK: NEGATIVE
Ketones, ur: 5 mg/dL — AB
LEUKOCYTES UA: NEGATIVE
NITRITE: NEGATIVE
PH: 5 (ref 5.0–8.0)
Protein, ur: NEGATIVE mg/dL
Specific Gravity, Urine: 1.02 (ref 1.005–1.030)
Squamous Epithelial / LPF: NONE SEEN (ref 0–5)

## 2018-02-23 LAB — POTASSIUM: Potassium: 5 mmol/L (ref 3.5–5.1)

## 2018-02-23 LAB — ANA W/REFLEX IF POSITIVE: ANA: NEGATIVE

## 2018-02-23 LAB — PROCALCITONIN: Procalcitonin: 1.3 ng/mL

## 2018-02-23 LAB — PHOSPHORUS: PHOSPHORUS: 4.2 mg/dL (ref 2.5–4.6)

## 2018-02-23 MED ORDER — POLYETHYLENE GLYCOL 3350 17 G PO PACK
17.0000 g | PACK | Freq: Once | ORAL | Status: AC
  Administered 2018-02-23: 17 g via ORAL
  Filled 2018-02-23: qty 1

## 2018-02-23 MED ORDER — FAMOTIDINE 20 MG PO TABS
20.0000 mg | ORAL_TABLET | Freq: Two times a day (BID) | ORAL | Status: DC
  Administered 2018-02-23 – 2018-02-28 (×11): 20 mg
  Filled 2018-02-23 (×11): qty 1

## 2018-02-23 MED ORDER — PRO-STAT SUGAR FREE PO LIQD
60.0000 mL | Freq: Every day | ORAL | Status: DC
  Administered 2018-02-23 – 2018-02-26 (×14): 60 mL

## 2018-02-23 MED ORDER — VITAL HIGH PROTEIN PO LIQD
1000.0000 mL | ORAL | Status: DC
  Administered 2018-02-23 – 2018-02-25 (×3): 1000 mL

## 2018-02-23 MED ORDER — SENNOSIDES-DOCUSATE SODIUM 8.6-50 MG PO TABS
1.0000 | ORAL_TABLET | Freq: Two times a day (BID) | ORAL | Status: DC
  Administered 2018-02-23 – 2018-02-26 (×7): 1 via ORAL
  Filled 2018-02-23 (×7): qty 1

## 2018-02-23 MED ORDER — INSULIN ASPART 100 UNIT/ML IV SOLN
10.0000 [IU] | Freq: Once | INTRAVENOUS | Status: AC
  Administered 2018-02-23: 10 [IU] via INTRAVENOUS
  Filled 2018-02-23: qty 0.1

## 2018-02-23 MED ORDER — QUETIAPINE FUMARATE 25 MG PO TABS
50.0000 mg | ORAL_TABLET | Freq: Every day | ORAL | Status: DC
  Administered 2018-02-23 – 2018-02-27 (×5): 50 mg
  Filled 2018-02-23 (×5): qty 2

## 2018-02-23 MED ORDER — DEXTROSE 50 % IV SOLN
25.0000 mL | Freq: Once | INTRAVENOUS | Status: AC
  Administered 2018-02-23: 25 mL via INTRAVENOUS
  Filled 2018-02-23: qty 50

## 2018-02-23 MED ORDER — CHLORHEXIDINE GLUCONATE 0.12 % MT SOLN
OROMUCOSAL | Status: AC
  Administered 2018-02-23: 15 mL via OROMUCOSAL
  Filled 2018-02-23: qty 15

## 2018-02-23 MED ORDER — ALPRAZOLAM 0.5 MG PO TABS
0.5000 mg | ORAL_TABLET | Freq: Three times a day (TID) | ORAL | Status: DC
  Administered 2018-02-23 – 2018-02-28 (×15): 0.5 mg
  Filled 2018-02-23 (×15): qty 1

## 2018-02-23 MED ORDER — OXYCODONE HCL 5 MG PO TABS
10.0000 mg | ORAL_TABLET | Freq: Four times a day (QID) | ORAL | Status: DC
  Administered 2018-02-23 – 2018-02-25 (×8): 10 mg
  Filled 2018-02-23 (×8): qty 2

## 2018-02-23 MED ORDER — FUROSEMIDE 10 MG/ML IJ SOLN
80.0000 mg | Freq: Once | INTRAMUSCULAR | Status: AC
  Administered 2018-02-23: 80 mg via INTRAVENOUS
  Filled 2018-02-23: qty 8

## 2018-02-23 MED ORDER — QUETIAPINE FUMARATE 25 MG PO TABS
25.0000 mg | ORAL_TABLET | Freq: Once | ORAL | Status: AC
  Administered 2018-02-23: 25 mg
  Filled 2018-02-23: qty 1

## 2018-02-23 MED ORDER — INSULIN ASPART 100 UNIT/ML ~~LOC~~ SOLN
0.0000 [IU] | SUBCUTANEOUS | Status: DC
  Administered 2018-02-23: 11 [IU] via SUBCUTANEOUS
  Filled 2018-02-23: qty 1

## 2018-02-23 MED ORDER — SODIUM CHLORIDE 0.9 % IV SOLN
INTRAVENOUS | Status: DC
  Administered 2018-02-23: 16.6 [IU]/h via INTRAVENOUS
  Administered 2018-02-23: 2.7 [IU]/h via INTRAVENOUS
  Administered 2018-02-24: 12.5 [IU]/h via INTRAVENOUS
  Administered 2018-02-24: 7 [IU]/h via INTRAVENOUS
  Administered 2018-02-25: 9.1 [IU]/h via INTRAVENOUS
  Administered 2018-02-25: 7.8 [IU]/h via INTRAVENOUS
  Administered 2018-02-25: 19.6 [IU]/h via INTRAVENOUS
  Administered 2018-02-26: 7.8 [IU]/h via INTRAVENOUS
  Filled 2018-02-23 (×10): qty 1

## 2018-02-23 NOTE — Progress Notes (Signed)
Constipation Note  Assessment 47 year old male with last bowel movement on 9/13.Patient on Oxycodone 10mg  q6h scheduled. Pharmacy to manage bowel regimen.  Plan Will give Miralax 17g packet once. Will startsennosides-docusate sodium 8.6-50mg  tablet scheduledtwice daily. Will reassess patient daily.  Bethanie DickerNithya Tareva Leske, PharmD Candidate 02/23/2018 1:33 PM

## 2018-02-23 NOTE — Progress Notes (Signed)
[facility]  ROUNDING NOTE   Subjective:   Tmax 101.8  K 6.3  UOP 4125  Objective:  Vital signs in last 24 hours:  Temp:  [98.6 F (37 C)-101.8 F (38.8 C)] 98.6 F (37 C) (09/17 1330) Pulse Rate:  [56-118] 62 (09/17 1330) Resp:  [19-36] 27 (09/17 1330) BP: (93-180)/(46-92) 106/52 (09/17 1330) SpO2:  [92 %-100 %] 94 % (09/17 1330) FiO2 (%):  [40 %] 40 % (09/17 1140) Weight:  [142.5 kg] 142.5 kg (09/17 0159)  Weight change: 0.3 kg Filed Weights   02/21/18 0327 02/22/18 0221 02/23/18 0159  Weight: (!) 142.1 kg (!) 142.2 kg (!) 142.5 kg    Intake/Output: I/O last 3 completed shifts: In: 5911.5 [I.V.:4261.6; IV Piggyback:1649.9] Out: 5700 [Urine:5200; Emesis/NG output:500]   Intake/Output this shift:  Total I/O In: 677.5 [I.V.:674.1; IV Piggyback:3.5] Out: 2900 [Urine:1550; Emesis/NG output:1350]  Physical Exam: General: Critically ill   Head: ETT, OGT  Eyes: Anicteric  Neck: No JVD  Lungs:  Scattered rhonchi, PRVC FIO2 40%  Heart: regular  Abdomen:  Soft, nontender   Extremities: 2+ peripheral edema.  Neurologic: Intubated/sedated  Skin: No lesions  GU +foley  Access: Right femoral dialysis catheter    Basic Metabolic Panel: Recent Labs  Lab 02/19/18 1837 02/19/18 2107 02/20/18 0213 02/20/18 0831 02/21/18 0328 02/21/18 1436 02/21/18 2348 02/22/18 0419 02/23/18 0414 02/23/18 1215  NA 133* 134* 136 139 141  --  142 142 138  --   K 5.5* 5.1 4.7 4.8 5.3* 5.7* 5.7* 5.3* 6.3* 5.0  CL 101 102 104 104 103  --  105 105 102  --   CO2 21* 19* 23 28 32  --  30 32 27  --   GLUCOSE 158* 172* 165* 136* 149*  --  145* 181* 363*  --   BUN 91* 82* 75* 58* 38*  --  46* 43* 46*  --   CREATININE 3.66* 3.14* 2.32* 1.48* 1.10  --  1.10 1.03 0.91  --   CALCIUM 7.7* 7.9* 8.3* 8.7* 9.0  --  9.0 8.9 9.1  --   MG 2.0 2.1 2.1 2.2  --   --   --   --  2.0  --   PHOS 7.3* 6.3* 5.0* 3.6  --   --   --   --  4.2  --     Liver Function Tests: Recent Labs  Lab  02/19/18 1155  02/19/18 1837 02/19/18 2107 02/20/18 0213 02/20/18 0831 02/22/18 0419  AST 23  --   --   --   --   --  29  ALT 21  --   --   --   --   --  19  ALKPHOS 94  --   --   --   --   --  58  BILITOT 1.2  --   --   --   --   --  0.7  PROT 7.8  --   --   --   --   --  6.6  ALBUMIN 3.4*   < > 3.2* 3.1* 3.2* 3.0* 2.8*   < > = values in this interval not displayed.   No results for input(s): LIPASE, AMYLASE in the last 168 hours. No results for input(s): AMMONIA in the last 168 hours.  CBC: Recent Labs  Lab 02/19/18 1155 02/22/18 0419 02/23/18 0414  WBC 19.2* 9.5 8.9  NEUTROABS 16.5*  --  7.8*  HGB 12.9* 10.5* 11.1*  HCT 39.2* 30.7* 32.5*  MCV 94.7 93.9 93.6  PLT 154 155 176    Cardiac Enzymes: Recent Labs  Lab 02/19/18 1155 02/19/18 1642 02/19/18 2107 02/20/18 0213  TROPONINI 0.05* 0.12* 0.09* 0.08*    BNP: Invalid input(s): POCBNP  CBG: Recent Labs  Lab 02/22/18 1934 02/22/18 2318 02/23/18 0330 02/23/18 0715 02/23/18 1145  GLUCAP 236* 263* 319* 324* 334*    Microbiology: Results for orders placed or performed during the hospital encounter of 02/19/18  Blood culture (routine x 2)     Status: None (Preliminary result)   Collection Time: 02/19/18 11:02 AM  Result Value Ref Range Status   Specimen Description BLOOD LEFT ANTECUBITAL  Final   Special Requests   Final    BOTTLES DRAWN AEROBIC AND ANAEROBIC Blood Culture results may not be optimal due to an inadequate volume of blood received in culture bottles   Culture   Final    NO GROWTH 4 DAYS Performed at [facility], [address] [place], [place] [address]    Report Status PENDING  Incomplete  Urine culture     Status: None   Collection Time: 02/19/18 11:36 AM  Result Value Ref Range Status   Specimen Description   Final    URINE, CLEAN CATCH Performed at [facility], [address] [place], [place] [address]    Special Requests   Final    NONE Performed at  [facility] [address] [place], [place] [address]    Culture   Final    NO GROWTH Performed at [facility], [place] [address] [place], [place] [address]    Report Status 02/20/2018 FINAL  Final  Blood culture (routine x 2)     Status: None (Preliminary result)   Collection Time: 02/19/18 11:55 AM  Result Value Ref Range Status   Specimen Description BLOOD RIGHT ANTECUBITAL  Final   Special Requests   Final    BOTTLES DRAWN AEROBIC AND ANAEROBIC Blood Culture adequate volume   Culture   Final    NO GROWTH 4 DAYS Performed at [facility], [address], [place], [place] [address]    Report Status PENDING  Incomplete  MRSA PCR Screening     Status: None   Collection Time: 02/19/18  2:31 PM  Result Value Ref Range Status   MRSA by PCR NEGATIVE NEGATIVE Final    Comment:        The GeneXpert MRSA Assay (FDA approved for NASAL specimens only), is one component of a comprehensive MRSA colonization surveillance program. It is not intended to diagnose MRSA infection nor to guide or monitor treatment for MRSA infections. Performed at [facility], [address] [place], [place] [address]   Culture, respiratory     Status: None (Preliminary result)   Collection Time: 02/22/18  6:00 AM  Result Value Ref Range Status   Specimen Description   Final    TRACHEAL ASPIRATE Performed at [facility], [address] [place], [place] [address]    Special Requests   Final    NONE Performed at [facility], [address] [place], [place] [address]    Gram Stain   Final    NO WBC SEEN RARE GRAM VARIABLE ROD RARE GRAM POSITIVE COCCI IN PAIRS    Culture   Final    CULTURE REINCUBATED FOR BETTER GROWTH Performed at [facility], [place] [address] [place], [place] [address]  Report Status PENDING  Incomplete    Coagulation Studies: No results for input(s): LABPROT, INR in the last 72 hours.  Urinalysis: Recent  Labs    02/23/18 0451  COLORURINE YELLOW*  LABSPEC 1.020  PHURINE 5.0  GLUCOSEU >=500*  HGBUR NEGATIVE  BILIRUBINUR NEGATIVE  KETONESUR 5*  PROTEINUR NEGATIVE  NITRITE NEGATIVE  LEUKOCYTESUR NEGATIVE      Imaging: Dg Chest Port 1 View  Result Date: 02/23/2018 CLINICAL DATA:  Respiratory failure. EXAM: PORTABLE CHEST 1 VIEW COMPARISON:  Radiograph of February 22, 2018. FINDINGS: Stable cardiomediastinal silhouette. Endotracheal and nasogastric tubes are unchanged in position. Interval placement of right-sided PICC line with distal tip in expected position of the SVC. No pneumothorax or pleural effusion is noted. No acute pulmonary disease is noted. Bony thorax is unremarkable. IMPRESSION: Interval placement of right sided PICC line with tip in SVC. Otherwise stable support apparatus. No acute abnormality seen. Electronically Signed   By: Marijo Conception, M.D.   On: 02/23/2018 08:22   Dg Chest Port 1 View  Result Date: 02/22/2018 CLINICAL DATA:  Status post intubation EXAM: PORTABLE CHEST 1 VIEW COMPARISON:  Film from earlier in the same day. FINDINGS: Endotracheal tube and nasogastric catheter are noted in satisfactory position. Cardiac shadow is stable. The lungs are well aerated without focal infiltrate. Previously seen left basilar changes have resolved in the interval. IMPRESSION: Tubes and lines as described. Improved aeration in the left base. Electronically Signed   By: Inez Catalina M.D.   On: 02/22/2018 09:12   Dg Chest Port 1 View  Result Date: 02/22/2018 CLINICAL DATA:  Hypoxia EXAM: PORTABLE CHEST 1 VIEW COMPARISON:  February 21, 2018 FINDINGS: Endotracheal tube tip is 6.1 cm above the carina. Nasogastric tube tip and side port are below the diaphragm. No pneumothorax. There is atelectatic change in the left base. There is also consolidation in the medial left base. Lungs elsewhere clear. Heart is slightly enlarged with pulmonary vascularity normal. No adenopathy. No bone  lesions. Thoracic stimulator tip at the level of T11, stable. IMPRESSION: Tube positions as described without pneumothorax. Atelectatic change in the left base with consolidation in the left base more medially. Pneumonia in the medial left base cannot be excluded. The right lung is clear. Stable cardiac silhouette. Electronically Signed   By: Lowella Grip III M.D.   On: 02/22/2018 07:16   [place] Ekg Site Rite  Result Date: 02/22/2018 If Site Rite image not attached, placement could not be confirmed due to current cardiac rhythm.    Medications:   . ceFEPime (MAXIPIME) IV Stopped (02/23/18 0345)  . dexmedetomidine (PRECEDEX) IV infusion Stopped (02/23/18 0711)  . fentaNYL infusion INTRAVENOUS 400 mcg/hr (02/23/18 1405)  . insulin (NOVOLIN-R) infusion 2.7 Units/hr (02/23/18 1328)  . midazolam (VERSED) infusion 5 mg/hr (02/23/18 1300)  . norepinephrine (LEVOPHED) Adult infusion 1 mcg/min (02/23/18 1300)  . propofol (DIPRIVAN) infusion 80 mcg/kg/min (02/23/18 1404)   . ALPRAZolam  0.5 mg Per Tube TID  . budesonide (PULMICORT) nebulizer solution  0.25 mg Nebulization BID  . budesonide (PULMICORT) nebulizer solution  0.25 mg Nebulization Once  . chlorhexidine gluconate (MEDLINE KIT)  15 mL Mouth Rinse BID  . famotidine  20 mg Per Tube BID  . feeding supplement (PRO-STAT SUGAR FREE 64)  60 mL Per Tube 5 X Daily  . feeding supplement (VITAL HIGH PROTEIN)  1,000 mL Per Tube Q24H  . heparin injection (subcutaneous)  5,000 Units Subcutaneous Q8H  . insulin detemir  10 Units Subcutaneous QHS  .  ipratropium-albuterol  3 mL Nebulization Q4H  . mouth rinse  15 mL Mouth Rinse 10 times per day  . methylPREDNISolone (SOLU-MEDROL) injection  40 mg Intravenous Q12H  . multivitamin  15 mL Per Tube Daily  . oxyCODONE  10 mg Per Tube Q6H  . QUEtiapine  50 mg Per Tube QHS   acetaminophen, bisacodyl, heparin, ibuprofen, midazolam, sodium phosphate  Assessment/ Plan:  47 y.o. white male with diabetes  mellitus type 2, hypertension, hyperlipidemia, pancreatitis, chronic back pain status post implantable spinal cord stimulator, chronic narcotic use, who was admitted to [facility] on 02/19/2018 for evaluation of decreased level of consciousness and generalized weakness.   1.  Acute renal failure, baseline creatinine 1.2 with a EGFR 65.  Acute renal failure suspected secondary to hypotension and acute tubular necrosis. 2.  Hyperkalemia 3.  Acute respiratory failure. 4.  Hyponatremia, likely hypovolemic. 5.  Acidosis, mixed.  Plan: No acute indication for dialysis With fever: discontinue dialysis catheter and send tip for culture.  Furosemide IV 26m x1    LOS: 4 Martin Riddle 9/17/20192:13 PM

## 2018-02-23 NOTE — Progress Notes (Signed)
Pharmacy Antibiotic Note  Martin Riddle is a 47 y.o. male admitted on 02/19/2018 with respiratory failure. Patient presenting with acute renal failure with hyperkalemia, on CRRT. Patient was previously on vancomycin. Pharmacy has been consulted for azithromycin and cefepime dosing.  Plan:   Continue cefepime 2 g IV q12h for a total of 7 days of therapy (stop on 9/20). Discontinue azithromycin 500 mg as today completes a total of 5 days of therapy.  Height: (S) 6\' 4"  (193 cm)(per wife) Weight: (!) 314 lb 2.5 oz (142.5 kg) IBW/kg (Calculated) : 86.8   Temp (24hrs), Avg:100.3 F (37.9 C), Min:98.6 F (37 C), Max:101.8 F (38.8 C)  Recent Labs  Lab 02/19/18 1102 02/19/18 1155  02/20/18 0831 02/21/18 0328 02/21/18 2348 02/22/18 0419 02/23/18 0414  WBC  --  19.2*  --   --   --   --  9.5 8.9  CREATININE  --  4.16*   < > 1.48* 1.10 1.10 1.03 0.91  LATICACIDVEN 1.6 1.9  --   --   --   --   --   --    < > = values in this interval not displayed.    Estimated Creatinine Clearance: 153.2 mL/min (by C-G formula based on SCr of 0.91 mg/dL).    Allergies  Allergen Reactions  . Morphine And Related Other (See Comments)    headache    Antimicrobials this admission: Ceftriaxone 9/13 x 1 Azithromycin 9/13 >> 9/17 Vancomycin 9/13 >> 9/16 Cefepime 9/13 >> 9/20  Dose adjustments this admission: N/A  Microbiology results: 9/17 BCx: pending 9/13 BCx: no growth 4 days 9/17 Cath Tip Cx: pending 9/17 RCx: reincubated for better growth 9/17 UCx: pending 9/13 UCx: no growth 9/13 MRSA PCR: negative  Thank you for allowing pharmacy to be a part of this patient's care.  Martin Riddle, PharmD Candidate 02/23/2018 1:58 PM

## 2018-02-23 NOTE — Progress Notes (Signed)
CRITICAL CARE NOTE  CC  follow up respiratory failure  SUBJECTIVE Patient remains critically ill Prognosis is guarded +febrile episodes Increased WOB On full vent support K is elevated     BP (!) 146/72   Pulse 61   Temp (!) 101.8 F (38.8 C)   Resp (!) 27   Ht 6\' 4"  (1.93 m)   Wt (!) 142.5 kg   SpO2 97%   BMI 38.24 kg/m    REVIEW OF SYSTEMS  PATIENT IS UNABLE TO PROVIDE COMPLETE REVIEW OF SYSTEMS DUE TO SEVERE CRITICAL ILLNESS   PHYSICAL EXAMINATION:  GENERAL:critically ill appearing, +resp distress HEAD: Normocephalic, atraumatic.  EYES: Pupils equal, round, reactive to light.  No scleral icterus.  MOUTH: Moist mucosal membrane. NECK: Supple. No thyromegaly. No nodules. No JVD.  PULMONARY: +rhonchi, +wheezing CARDIOVASCULAR: S1 and S2. Regular rate and rhythm. No murmurs, rubs, or gallops.  GASTROINTESTINAL: Soft, nontender, -distended. No masses. Positive bowel sounds. No hepatosplenomegaly.  MUSCULOSKELETAL: No swelling, clubbing, or edema.  NEUROLOGIC: obtunded, GCS<8 SKIN:intact,warm,dry      Indwelling Urinary Catheter continued, requirement due to   Reason to continue Indwelling Urinary Catheter for strict Intake/Output monitoring for hemodynamic instability   Central Line continued, requirement due to   Reason to continue [employer] Monitoring of central venous pressure or other hemodynamic parameters   Ventilator continued, requirement due to, resp failure    Ventilator Sedation RASS 0 to -2     ASSESSMENT AND PLAN SYNOPSIS 47 yo male with severe resp failure from pneumonia complicated by acute renal failure on  Vent support s/p CRRT Failure to wean from vent  Severe Hypoxic and Hypercapnic Respiratory Failure -continue Full MV support -continue Bronchodilator Therapy -Wean Fio2 and PEEP as tolerated -will NOT  perform SAT/SBT due to increased WOB   Renal Failure-most likely due to ATN -follow chem 7 -follow UO -continue  Foley Catheter-assess need daily K is elevated Follow up nephrology recs   NEUROLOGY - intubated and sedated - minimal sedation to achieve a RASS goal: -1 Wake up assessment pending   CARDIAC ICU monitoring  ID -continue IV abx as prescibed -follow up cultures   ENDO - ICU hypoglycemic\Hyperglycemia protocol -check FSBS per protocol   ELECTROLYTES -follow labs as needed -replace as needed -pharmacy consultation and following   DVT/GI PRX ordered TRANSFUSIONS AS NEEDED MONITOR FSBS ASSESS the need for LABS as needed   Critical Care Time devoted to patient care services described in this note is 45 minutes.   Overall, patient is critically ill, prognosis is guarded.  Patient with Multiorgan failure and at high risk for cardiac arrest and death.   Patient remains on vent, unable to wean at this time due to resp muscle fatigue and increased WOB Check CXR today  Lucie LeatherKurian David Montel Vanderhoof, M.D.  [facility]  Medical Director [facility] Medical Director [facility] Cardio-P[facility] Department

## 2018-02-23 NOTE — Progress Notes (Signed)
Nutrition Follow-up  DOCUMENTATION CODES:   Obesity unspecified  INTERVENTION:  Plan is to switch patient to a lower potassium tube feed formula. With previous formula/rate patient was only receiving 1920 mg potassium daily, which is an acceptable amount of potassium, even for patients in renal failure. It is also important to note that patient was experiencing hyperkalemia prior to initiation of tube feeds yesterday. Nepro not appropriate for use due to high calorie provision/mL as patient is on very high propofol infusion. Will reduce rate of tube feed formula, which will provide less potassium.  Initiate Vital High Protein at 20 mL/hr (480 mL goal daily volume) + Pro-Stat 60 mL 5 times daily via OGT. Provides 1480 kcal, 192 grams of protein, 768 mg potassium daily, 403 mL H2O daily. With current propofol rate provides 3283 kcal daily.  Continue liquid MVI daily per tube.  NUTRITION DIAGNOSIS:   Inadequate oral intake related to inability to eat as evidenced by NPO status.  Ongoing - addressing with TF regimen.  GOAL:   Provide needs based on ASPEN/SCCM guidelines  Met with TF regimen.  MONITOR:   Vent status, Labs, Weight trends, TF tolerance, Skin, I & O's  REASON FOR ASSESSMENT:   Ventilator    ASSESSMENT:   47 year old male with PMHx of DM, HTN, hypercholesterolemia, pancreatitis, chronic back pain s/p placement of spinal stimulator, chronic narcotic use who was admitted with respiratory failure requiring intubation 9/13, left upper lobe PNA, septic shock, renal failure requiring CRRT 9/13-9/14 now improving.  Patient remains intubated and sedated. On PRVC mode with FiO2 40% and PEEP 5 cmH2O.   Access: 16 Fr. OGT placed 9/13; terminates in stomach per abdominal x-ray 9/13; 70 cm at corner of mouth  TF: pt tolerating Vital High Protein at 50 mL/hr  Patient is currently intubated on ventilator support Ve: 16.4 L/min Temp (24hrs), Avg:100.5 F (38.1 C), Min:99.3 F  (37.4 C), Max:101.8 F (38.8 C)  Propofol: 68.3 mL/hr (1803 kcal daily)  Medications reviewed and include: Xanax 0.5 mg TID, famotidine, Levemir 10 units QHS, Solu-Medrol 40 mg Q12hrs IV, liquid MVI daily, Seroquel QHS, azithromycin, cefepime, Precedex gtt, fentanyl gtt, Versed gtt, propofol gtt.  Labs reviewed: CBG 137-324 past 24 hrs, Potassium 6.3 (was previously 5.3-5.7 before starting tube feeds), BUN 46, Triglycerides 571.  I/O: 4125 mL UOP yesterday (1.2 mL/kg/hr)  Weight trend: 142.5 kg on 9/14; +0.3 kg from yesterday  Discussed with RN and on rounds. Patient is now on propofol gtt. Plan is to lower potassium intake from tube feeds. Starting insulin gtt today.  Diet Order:   Diet Order    None      EDUCATION NEEDS:   No education needs have been identified at this time  Skin:  Skin Assessment: Skin Integrity Issues: Skin Integrity Issues:: Diabetic Ulcer Diabetic Ulcer: right great toe  Last BM:  02/19/2018 - BM characteristics not documented  Height:   Ht Readings from Last 1 Encounters:  02/23/18 (S) '6\' 4"'  (1.93 m)    Weight:   Wt Readings from Last 1 Encounters:  02/23/18 (!) 142.5 kg    Ideal Body Weight:  91.8 kg  BMI:  Body mass index is 38.24 kg/m.  Estimated Nutritional Needs:   Kcal:  8280-0349 (11-14 kcal/kg)  Protein:  184 grams (2 grams/kg IBW)  Fluid:  2.3-2.7 L/day (25-30 mL/kg IBW)  Willey Blade, MS, RD, LDN Office: [phone] Pager: [phone] After Hours/Weekend Pager: [phone]

## 2018-02-23 NOTE — Progress Notes (Signed)
Inpatient Diabetes Program Recommendations  AACE/ADA: New Consensus Statement on Inpatient Glycemic Control (2019)  Target Ranges:  Prepandial:   less than 140 mg/dL      Peak postprandial:   less than 180 mg/dL (1-2 hours)      Critically ill patients:  140 - 180 mg/dL   Results for Martin Riddle, Martin Riddle (MRN [id number]) as of 02/23/2018 09:19  Ref. Range 02/22/2018 07:43 02/22/2018 11:52 02/22/2018 15:43 02/22/2018 19:34 02/22/2018 23:18 02/23/2018 03:30 02/23/2018 07:15  Glucose-Capillary Latest Ref Range: 70 - 99 mg/dL 147158 (H) 829137 (H) 562169 (H) 236 (H) 263 (H) 319 (H) 324 (H)   Review of Glycemic Control  Diabetes history: DM2 Outpatient Diabetes medications: NPH 35 units BID, Regular 18 units TID with meals, Glipizide XL 10 mg daily, Metformin 1000 mg BID Current orders for Inpatient glycemic control: Levemir 10 units QHS, Novolog 0-15 units Q4H; Solumedrol 40 mg Q12H; Vital @ 50 ml/hr  Inpatient Diabetes Program Recommendations: Insulin - Basal: If steroids are continued, please consider changing Levemir to 15 units BID (starting this morning). Insulin - Tube Feeding Coverage: Please consider ordering Novolog 4 units Q4H for tube feeding coverage. If tube feeding is stopped or held then Novolog tube feeding coverage should also be stopped or held.  Thanks, Orlando PennerMarie Kellar Westberg, RN, MSN, CDE Diabetes Coordinator Inpatient Diabetes Program [phone] (Team Pager from 8am to 5pm)

## 2018-02-24 LAB — GLUCOSE, CAPILLARY
GLUCOSE-CAPILLARY: 188 mg/dL — AB (ref 70–99)
GLUCOSE-CAPILLARY: 144 mg/dL — AB (ref 70–99)
GLUCOSE-CAPILLARY: 188 mg/dL — AB (ref 70–99)
GLUCOSE-CAPILLARY: 159 mg/dL — AB (ref 70–99)
GLUCOSE-CAPILLARY: 132 mg/dL — AB (ref 70–99)
GLUCOSE-CAPILLARY: 128 mg/dL — AB (ref 70–99)
GLUCOSE-CAPILLARY: 137 mg/dL — AB (ref 70–99)
GLUCOSE-CAPILLARY: 167 mg/dL — AB (ref 70–99)
GLUCOSE-CAPILLARY: 188 mg/dL — AB (ref 70–99)
GLUCOSE-CAPILLARY: 183 mg/dL — AB (ref 70–99)
Glucose-Capillary: 138 mg/dL — ABNORMAL HIGH (ref 70–99)
Glucose-Capillary: 140 mg/dL — ABNORMAL HIGH (ref 70–99)
Glucose-Capillary: 146 mg/dL — ABNORMAL HIGH (ref 70–99)
Glucose-Capillary: 148 mg/dL — ABNORMAL HIGH (ref 70–99)
Glucose-Capillary: 156 mg/dL — ABNORMAL HIGH (ref 70–99)
Glucose-Capillary: 160 mg/dL — ABNORMAL HIGH (ref 70–99)
Glucose-Capillary: 160 mg/dL — ABNORMAL HIGH (ref 70–99)
Glucose-Capillary: 161 mg/dL — ABNORMAL HIGH (ref 70–99)
Glucose-Capillary: 161 mg/dL — ABNORMAL HIGH (ref 70–99)
Glucose-Capillary: 162 mg/dL — ABNORMAL HIGH (ref 70–99)
Glucose-Capillary: 183 mg/dL — ABNORMAL HIGH (ref 70–99)
Glucose-Capillary: 186 mg/dL — ABNORMAL HIGH (ref 70–99)
Glucose-Capillary: 206 mg/dL — ABNORMAL HIGH (ref 70–99)
Glucose-Capillary: 224 mg/dL — ABNORMAL HIGH (ref 70–99)

## 2018-02-24 LAB — CBC
HCT: 32.4 % — ABNORMAL LOW (ref 40.0–52.0)
HEMOGLOBIN: 10.9 g/dL — AB (ref 13.0–18.0)
MCH: 31.4 pg (ref 26.0–34.0)
MCHC: 33.7 g/dL (ref 32.0–36.0)
MCV: 93.3 fL (ref 80.0–100.0)
Platelets: 195 10*3/uL (ref 150–440)
RBC: 3.47 MIL/uL — ABNORMAL LOW (ref 4.40–5.90)
RDW: 14.1 % (ref 11.5–14.5)
WBC: 9.5 10*3/uL (ref 3.8–10.6)

## 2018-02-24 LAB — CULTURE, BLOOD (ROUTINE X 2)
Culture: NO GROWTH
Culture: NO GROWTH
Special Requests: ADEQUATE

## 2018-02-24 LAB — BASIC METABOLIC PANEL
ANION GAP: 9 (ref 5–15)
BUN: 56 mg/dL — ABNORMAL HIGH (ref 6–20)
CALCIUM: 9.5 mg/dL (ref 8.9–10.3)
CO2: 31 mmol/L (ref 22–32)
CREATININE: 0.95 mg/dL (ref 0.61–1.24)
Chloride: 104 mmol/L (ref 98–111)
GFR calc non Af Amer: >60 mL/min (ref >60)
Glucose, Bld: 194 mg/dL — ABNORMAL HIGH (ref 70–99)
Potassium: 5 mmol/L (ref 3.5–5.1)
Sodium: 144 mmol/L (ref 135–145)

## 2018-02-24 LAB — URINE CULTURE: CULTURE: NO GROWTH

## 2018-02-24 LAB — CULTURE, RESPIRATORY: CULTURE: NORMAL

## 2018-02-24 LAB — CULTURE, RESPIRATORY W GRAM STAIN: Gram Stain: NONE SEEN

## 2018-02-24 MED ORDER — POLYETHYLENE GLYCOL 3350 17 G PO PACK
17.0000 g | PACK | Freq: Once | ORAL | Status: AC
  Administered 2018-02-24: 17 g via ORAL
  Filled 2018-02-24: qty 1

## 2018-02-24 NOTE — Progress Notes (Signed)
[facility]  ROUNDING NOTE   Subjective:   Tmax 100.8  K 5  UOP 3000  Objective:  Vital signs in last 24 hours:  Temp:  [98.4 F (36.9 C)-100.8 F (38.2 C)] 100.2 F (37.9 C) (09/18 0800) Pulse Rate:  [60-106] 106 (09/18 0800) Resp:  [22-32] 29 (09/18 0800) BP: (99-149)/(44-78) 149/78 (09/18 0800) SpO2:  [93 %-98 %] 98 % (09/18 0800) FiO2 (%):  [40 %] 40 % (09/18 0800) Weight:  [138.9 kg] 138.9 kg (09/18 0424)  Weight change: -3.6 kg Filed Weights   02/22/18 0221 02/23/18 0159 02/24/18 0424  Weight: (!) 142.2 kg (!) 142.5 kg (!) 138.9 kg    Intake/Output: I/O last 3 completed shifts: In: 5319.8 [I.V.:4966.2; IV Piggyback:353.6] Out: 6734 [Urine:5800; Emesis/NG output:1350]   Intake/Output this shift:  Total I/O In: 1554.7 [I.V.:106.2; NG/GT:1448.5] Out: 370 [Urine:370]  Physical Exam: General: Critically ill   Head: ETT, OGT  Eyes: Anicteric  Neck: No JVD  Lungs:  Scattered rhonchi, PRVC FIO2 40%  Heart: regular  Abdomen:  Soft, nontender   Extremities: 2+ peripheral edema.  Neurologic: Intubated/sedated  Skin: No lesions  GU +foley  Access: none    Basic Metabolic Panel: Recent Labs  Lab 02/19/18 1837 02/19/18 2107 02/20/18 0213 02/20/18 0831 02/21/18 0328  02/21/18 2348 02/22/18 0419 02/23/18 0414 02/23/18 1215 02/24/18 0450  NA 133* 134* 136 139 141  --  142 142 138  --  144  K 5.5* 5.1 4.7 4.8 5.3*   < > 5.7* 5.3* 6.3* 5.0 5.0  CL 101 102 104 104 103  --  105 105 102  --  104  CO2 21* 19* 23 28 32  --  30 32 27  --  31  GLUCOSE 158* 172* 165* 136* 149*  --  145* 181* 363*  --  194*  BUN 91* 82* 75* 58* 38*  --  46* 43* 46*  --  56*  CREATININE 3.66* 3.14* 2.32* 1.48* 1.10  --  1.10 1.03 0.91  --  0.95  CALCIUM 7.7* 7.9* 8.3* 8.7* 9.0  --  9.0 8.9 9.1  --  9.5  MG 2.0 2.1 2.1 2.2  --   --   --   --  2.0  --   --   PHOS 7.3* 6.3* 5.0* 3.6  --   --   --   --  4.2  --   --    < > = values in this interval not displayed.     Liver Function Tests: Recent Labs  Lab 02/19/18 1155  02/19/18 1837 02/19/18 2107 02/20/18 0213 02/20/18 0831 02/22/18 0419  AST 23  --   --   --   --   --  29  ALT 21  --   --   --   --   --  19  ALKPHOS 94  --   --   --   --   --  58  BILITOT 1.2  --   --   --   --   --  0.7  PROT 7.8  --   --   --   --   --  6.6  ALBUMIN 3.4*   < > 3.2* 3.1* 3.2* 3.0* 2.8*   < > = values in this interval not displayed.   No results for input(s): LIPASE, AMYLASE in the last 168 hours. No results for input(s): AMMONIA in the last 168 hours.  CBC: Recent Labs  Lab 02/19/18  1155 02/22/18 0419 02/23/18 0414 02/24/18 0450  WBC 19.2* 9.5 8.9 9.5  NEUTROABS 16.5*  --  7.8*  --   HGB 12.9* 10.5* 11.1* 10.9*  HCT 39.2* 30.7* 32.5* 32.4*  MCV 94.7 93.9 93.6 93.3  PLT 154 155 176 195    Cardiac Enzymes: Recent Labs  Lab 02/19/18 1155 02/19/18 1642 02/19/18 2107 02/20/18 0213  TROPONINI 0.05* 0.12* 0.09* 0.08*    BNP: Invalid input(s): POCBNP  CBG: Recent Labs  Lab 02/24/18 0326 02/24/18 0419 02/24/18 0521 02/24/18 0622 02/24/18 0729  GLUCAP 156* 186* 160* 188* 25*    Microbiology: Results for orders placed or performed during the hospital encounter of 02/19/18  Blood culture (routine x 2)     Status: None   Collection Time: 02/19/18 11:02 AM  Result Value Ref Range Status   Specimen Description BLOOD LEFT ANTECUBITAL  Final   Special Requests   Final    BOTTLES DRAWN AEROBIC AND ANAEROBIC Blood Culture results may not be optimal due to an inadequate volume of blood received in culture bottles   Culture   Final    NO GROWTH 5 DAYS Performed at [facility], [address], [place], [place] [address]    Report Status 02/24/2018 FINAL  Final  Urine culture     Status: None   Collection Time: 02/19/18 11:36 AM  Result Value Ref Range Status   Specimen Description   Final    URINE, CLEAN CATCH Performed at [facility], [address]  [place], [place] [address]    Special Requests   Final    NONE Performed at [facility], [address], [place], [place] [address]    Culture   Final    NO GROWTH Performed at [facility], [place] [address] [place], [place] [address]    Report Status 02/20/2018 FINAL  Final  Blood culture (routine x 2)     Status: None   Collection Time: 02/19/18 11:55 AM  Result Value Ref Range Status   Specimen Description BLOOD RIGHT ANTECUBITAL  Final   Special Requests   Final    BOTTLES DRAWN AEROBIC AND ANAEROBIC Blood Culture adequate volume   Culture   Final    NO GROWTH 5 DAYS Performed at [facility], [address] [place], [place] [address]    Report Status 02/24/2018 FINAL  Final  MRSA PCR Screening     Status: None   Collection Time: 02/19/18  2:31 PM  Result Value Ref Range Status   MRSA by PCR NEGATIVE NEGATIVE Final    Comment:        The GeneXpert MRSA Assay (FDA approved for NASAL specimens only), is one component of a comprehensive MRSA colonization surveillance program. It is not intended to diagnose MRSA infection nor to guide or monitor treatment for MRSA infections. Performed at [facility], [address] [place], [place] [address]   Culture, respiratory     Status: None (Preliminary result)   Collection Time: 02/22/18  6:00 AM  Result Value Ref Range Status   Specimen Description   Final    TRACHEAL ASPIRATE Performed at [facility], [address], [place],  [address]    Special Requests   Final    NONE Performed at [facility], [place], [place] [address]    Gram Stain   Final    NO WBC SEEN RARE GRAM VARIABLE ROD RARE GRAM POSITIVE COCCI IN PAIRS  Culture   Final    CULTURE REINCUBATED FOR BETTER GROWTH Performed at [facility], [place] [address], [place], [place] [address]    Report Status PENDING  Incomplete  CULTURE, BLOOD (ROUTINE X 2) w Reflex to ID Panel      Status: None (Preliminary result)   Collection Time: 02/23/18  4:37 AM  Result Value Ref Range Status   Specimen Description BLOOD LEFT WRIST  Final   Special Requests   Final    BOTTLES DRAWN AEROBIC AND ANAEROBIC Blood Culture adequate volume   Culture   Final    NO GROWTH 1 DAY Performed at [facility] [place], [address], [place], [place] [address]    Report Status PENDING  Incomplete  CULTURE, BLOOD (ROUTINE X 2) w Reflex to ID Panel     Status: None (Preliminary result)   Collection Time: 02/23/18  4:48 AM  Result Value Ref Range Status   Specimen Description BLOOD LEFT HAND  Final   Special Requests   Final    BOTTLES DRAWN AEROBIC AND ANAEROBIC Blood Culture adequate volume   Culture   Final    NO GROWTH 1 DAY Performed at [facility], [address], [place], [place] [address]    Report Status PENDING  Incomplete  Urine Culture     Status: None   Collection Time: 02/23/18  4:51 AM  Result Value Ref Range Status   Specimen Description   Final    URINE, RANDOM Performed at [facility], [address] [place], [place] [address]    Special Requests   Final    NONE Performed at [facility], [address], [place], [place] [address]    Culture   Final    NO GROWTH Performed at [facility], [place] [address], [place],  [address]    Report Status 02/24/2018 FINAL  Final    Coagulation Studies: No results for input(s): LABPROT, INR in the last 72 hours.  Urinalysis: Recent Labs    02/23/18 0451  COLORURINE YELLOW*  LABSPEC 1.020  PHURINE 5.0  GLUCOSEU >=500*  HGBUR NEGATIVE  BILIRUBINUR NEGATIVE  KETONESUR 5*  PROTEINUR NEGATIVE  NITRITE NEGATIVE  LEUKOCYTESUR NEGATIVE      Imaging: Dg Chest Port 1 View  Result Date: 02/23/2018 CLINICAL DATA:  Respiratory failure. EXAM: PORTABLE CHEST 1 VIEW COMPARISON:  Radiograph of February 22, 2018. FINDINGS: Stable cardiomediastinal silhouette. Endotracheal and  nasogastric tubes are unchanged in position. Interval placement of right-sided PICC line with distal tip in expected position of the SVC. No pneumothorax or pleural effusion is noted. No acute pulmonary disease is noted. Bony thorax is unremarkable. IMPRESSION: Interval placement of right sided PICC line with tip in SVC. Otherwise stable support apparatus. No acute abnormality seen. Electronically Signed   By: Marijo Conception, M.D.   On: 02/23/2018 08:22   Dg Chest Port 1 View  Result Date: 02/22/2018 CLINICAL DATA:  Status post intubation EXAM: PORTABLE CHEST 1 VIEW COMPARISON:  Film from earlier in the same day. FINDINGS: Endotracheal tube and nasogastric catheter are noted in satisfactory position. Cardiac shadow is stable. The lungs are well aerated without focal infiltrate. Previously seen left basilar changes have resolved in the interval. IMPRESSION: Tubes and lines as described. Improved aeration in the left base. Electronically Signed   By: Inez Catalina M.D.   On: 02/22/2018 09:12   [place] Ekg Site Rite  Result Date: 02/22/2018 If [employer] not attached,  placement could not be confirmed due to current cardiac rhythm.    Medications:   . ceFEPime (MAXIPIME) IV Stopped (02/24/18 0529)  . dexmedetomidine (PRECEDEX) IV infusion Stopped (02/23/18 0711)  . fentaNYL infusion INTRAVENOUS 400 mcg/hr (02/24/18 0800)  . insulin (NOVOLIN-R) infusion 15.8 Units/hr (02/24/18 0824)  . midazolam (VERSED) infusion 3 mg/hr (02/24/18 0805)  . norepinephrine (LEVOPHED) Adult infusion Stopped (02/24/18 0549)  . propofol (DIPRIVAN) infusion 70 mcg/kg/min (02/24/18 0820)   . ALPRAZolam  0.5 mg Per Tube TID  . budesonide (PULMICORT) nebulizer solution  0.25 mg Nebulization BID  . budesonide (PULMICORT) nebulizer solution  0.25 mg Nebulization Once  . chlorhexidine gluconate (MEDLINE KIT)  15 mL Mouth Rinse BID  . famotidine  20 mg Per Tube BID  . feeding supplement (PRO-STAT SUGAR FREE 64)  60 mL Per  Tube 5 X Daily  . feeding supplement (VITAL HIGH PROTEIN)  1,000 mL Per Tube Q24H  . heparin injection (subcutaneous)  5,000 Units Subcutaneous Q8H  . insulin detemir  10 Units Subcutaneous QHS  . ipratropium-albuterol  3 mL Nebulization Q4H  . mouth rinse  15 mL Mouth Rinse 10 times per day  . methylPREDNISolone (SOLU-MEDROL) injection  40 mg Intravenous Q12H  . multivitamin  15 mL Per Tube Daily  . oxyCODONE  10 mg Per Tube Q6H  . QUEtiapine  50 mg Per Tube QHS  . senna-docusate  1 tablet Oral BID   acetaminophen, bisacodyl, heparin, ibuprofen, midazolam, sodium phosphate  Assessment/ Plan:  47 y.o. white male with diabetes mellitus type 2, hypertension, hyperlipidemia, pancreatitis, chronic back pain status post implantable spinal cord stimulator, chronic narcotic use, who was admitted to [facility] on 02/19/2018 for evaluation of decreased level of consciousness and generalized weakness.   1.  Acute renal failure, baseline creatinine 1.2 with a EGFR 65.  Acute renal failure suspected secondary to hypotension and acute tubular necrosis. 2.  Hyperkalemia 3.  Acute respiratory failure. 4.  Hyponatremia, likely hypovolemic. 5.  Acidosis, mixed.  Plan: Furosemide as per primary team Will sign off. Please call with questions.    LOS: 5 Jyssica Rief 9/18/20199:03 AM

## 2018-02-24 NOTE — Progress Notes (Signed)
Patient with severe anxiety during attempted SBT this afternoon, tolerated sedation off approx 13 minutes, SBT approx 5 minutes.  Pronounced facial flushing, tachypnea, tachycardia, tears from eyes, ABD breathing, stacking breaths and bucking the vent, copious oral secretions.  Sedation restarted, bolus given for severe anxiety. Patient able to move toes on command but did not turn his head to track, unable to lift arms.  Appears very deconditioned.

## 2018-02-24 NOTE — Progress Notes (Signed)
[employer] [facility] [place] at [facility]   PATIENT NAME: Royale Swamy    MR#:  [id number]  DATE OF BIRTH:  08-25-1970  SUBJECTIVE:  CHIEF COMPLAINT:   Chief Complaint  Patient presents with  . Weakness   Brought with altered mental status and noted to have respiratory failure and intubated.  Septic shock with multiorgan failure and required dialysis initially.  Blood pressure stable. Renal function improved, have good urine output.  Failed SBT.  Had some fever , HD catheter removed.  REVIEW OF SYSTEMS:   Patient is intubated and sedated.  ROS  DRUG ALLERGIES:   Allergies  Allergen Reactions  . Morphine And Related Other (See Comments)    headache    VITALS:  Blood pressure 114/60, pulse 82, temperature 99.9 F (37.7 C), resp. rate (!) 25, height (S) 6\' 4"  (1.93 m), weight (!) 138.9 kg, SpO2 92 %.  PHYSICAL EXAMINATION:  GENERAL:  47 y.o.-year-old patient lying in the bed with no acute distress.  EYES: Pupils equal, round, reactive to light and accommodation. No scleral icterus. Extraocular muscles intact.  HEENT: Head atraumatic, normocephalic. Oropharynx and nasopharynx clear.  NECK:  Supple, no jugular venous distention. No thyroid enlargement, no tenderness.  LUNGS: Normal breath sounds bilaterally, no wheezing, rales,rhonchi or crepitation. No use of accessory muscles of respiration.  CARDIOVASCULAR: S1, S2 normal. No murmurs, rubs, or gallops.  ABDOMEN: Soft, nontender, nondistended. Bowel sounds present. No organomegaly or mass.  EXTREMITIES: No pedal edema, cyanosis, or clubbing.  NEUROLOGIC: Patient is sedated and intubated currently. PSYCHIATRIC: Sedated and intubated.  SKIN: No obvious rash, lesion, or ulcer.   Physical Exam LABORATORY PANEL:   CBC Recent Labs  Lab 02/24/18 0450  WBC 9.5  HGB 10.9*  HCT 32.4*  PLT 195    ------------------------------------------------------------------------------------------------------------------  Chemistries  Recent Labs  Lab 02/22/18 0419 02/23/18 0414  02/24/18 0450  NA 142 138  --  144  K 5.3* 6.3*   < > 5.0  CL 105 102  --  104  CO2 32 27  --  31  GLUCOSE 181* 363*  --  194*  BUN 43* 46*  --  56*  CREATININE 1.03 0.91  --  0.95  CALCIUM 8.9 9.1  --  9.5  MG  --  2.0  --   --   AST 29  --   --   --   ALT 19  --   --   --   ALKPHOS 58  --   --   --   BILITOT 0.7  --   --   --    < > = values in this interval not displayed.   ------------------------------------------------------------------------------------------------------------------  Cardiac Enzymes Recent Labs  Lab 02/19/18 2107 02/20/18 0213  TROPONINI 0.09* 0.08*   ------------------------------------------------------------------------------------------------------------------  RADIOLOGY:  Dg Chest Port 1 View  Result Date: 02/23/2018 CLINICAL DATA:  Respiratory failure. EXAM: PORTABLE CHEST 1 VIEW COMPARISON:  Radiograph of February 22, 2018. FINDINGS: Stable cardiomediastinal silhouette. Endotracheal and nasogastric tubes are unchanged in position. Interval placement of right-sided PICC line with distal tip in expected position of the SVC. No pneumothorax or pleural effusion is noted. No acute pulmonary disease is noted. Bony thorax is unremarkable. IMPRESSION: Interval placement of right sided PICC line with tip in SVC. Otherwise stable support apparatus. No acute abnormality seen. Electronically Signed   By: Lupita Raider, M.D.   On: 02/23/2018 08:22    ASSESSMENT AND PLAN:   Active Problems:  Respiratory failure (HCC)  47 year old male with history of hypertension, chronic back pain on chronic narcotics with back stimulator presents to the emergency room with 2 days of generalized weakness and unresponsiveness.  1.  Sepsis with acute hypoxic respiratory failure in the  setting of pneumonia: Patient presents with tachypnea, leukocytosis and tachycardia S/p intubation Management as per intencivist.  2.  Pneumonia:    New fever 02/23/18- HD catheter removed and sent for culture- negative. Cefepime and vancomycin Follow-up on blood cultures- negative so far.  3.  Hyperkalemia: This was treated in the emergency room.  This is due to acute kidney injury. CRRT with help of nephrology.  K rising again, suggest to manage with lasix. Came down.   4.  Acute kidney injury in the setting of poor p.o. intake and sepsis Nephrology consultation Dialysis done. Now nephrologist is just monitoring renal function, it is improving and patient had good urine output.  5.  Hyponatremia from dehydration- improved.  6.  Elevated troponin: This is likely due to sepsis  echocardiogram  7.  Diabetes: Monitor as per ICU protocol   All the records are reviewed and case discussed with Care Management/Social Workerr. Management plans discussed with the patient, family and they are in agreement.  CODE STATUS: full.  TOTAL TIME TAKING CARE OF THIS PATIENT: 35 minutes.    POSSIBLE D/C IN 2-3 DAYS, DEPENDING ON CLINICAL CONDITION.   Altamese DillingVaibhavkumar Shannyn Jankowiak M.D on 02/24/2018   Between 7am to 6pm - Pager - [phone]  After 6pm go to [email or web address] - Social research officer, governmentpassword [email or web address] [email or web address]  [facility] [place] [facility]  Office  [phone]  CC: Primary care physician; Lauro RegulusAnderson, Marshall W, MD  Note: This dictation was prepared with Dragon dictation along with smaller phrase technology. Any transcriptional errors that result from this process are unintentional.

## 2018-02-24 NOTE — Progress Notes (Signed)
[employer] - [place] at [facility]   PATIENT NAME: Martin Riddle    MR#:  [id number]  DATE OF BIRTH:  08/14/1970  SUBJECTIVE:  CHIEF COMPLAINT:   Chief Complaint  Patient presents with  . Weakness   Brought with altered mental status and noted to have respiratory failure and intubated.  Septic shock with multiorgan failure and required dialysis initially.  Blood pressure stable. Renal function improved, have good urine output.  Failed SBT.  Had some fever last night, HD catheter removed.  REVIEW OF SYSTEMS:   Patient is intubated and sedated.  ROS  DRUG ALLERGIES:   Allergies  Allergen Reactions  . Morphine And Related Other (See Comments)    headache    VITALS:  Blood pressure (!) 149/78, pulse (!) 106, temperature 100.2 F (37.9 C), temperature source Bladder, resp. rate (!) 29, height (S) 6\' 4"  (1.93 m), weight (!) 138.9 kg, SpO2 98 %.  PHYSICAL EXAMINATION:  GENERAL:  47 y.o.-year-old patient lying in the bed with no acute distress.  EYES: Pupils equal, round, reactive to light and accommodation. No scleral icterus. Extraocular muscles intact.  HEENT: Head atraumatic, normocephalic. Oropharynx and nasopharynx clear.  NECK:  Supple, no jugular venous distention. No thyroid enlargement, no tenderness.  LUNGS: Normal breath sounds bilaterally, no wheezing, rales,rhonchi or crepitation. No use of accessory muscles of respiration.  CARDIOVASCULAR: S1, S2 normal. No murmurs, rubs, or gallops.  ABDOMEN: Soft, nontender, nondistended. Bowel sounds present. No organomegaly or mass.  EXTREMITIES: No pedal edema, cyanosis, or clubbing.  NEUROLOGIC: Patient is sedated and intubated currently. PSYCHIATRIC: Sedated and intubated.  SKIN: No obvious rash, lesion, or ulcer.   Physical Exam LABORATORY PANEL:   CBC Recent Labs  Lab 02/24/18 0450  WBC 9.5  HGB 10.9*  HCT 32.4*  PLT 195    ------------------------------------------------------------------------------------------------------------------  Chemistries  Recent Labs  Lab 02/22/18 0419 02/23/18 0414  02/24/18 0450  NA 142 138  --  144  K 5.3* 6.3*   < > 5.0  CL 105 102  --  104  CO2 32 27  --  31  GLUCOSE 181* 363*  --  194*  BUN 43* 46*  --  56*  CREATININE 1.03 0.91  --  0.95  CALCIUM 8.9 9.1  --  9.5  MG  --  2.0  --   --   AST 29  --   --   --   ALT 19  --   --   --   ALKPHOS 58  --   --   --   BILITOT 0.7  --   --   --    < > = values in this interval not displayed.   ------------------------------------------------------------------------------------------------------------------  Cardiac Enzymes Recent Labs  Lab 02/19/18 2107 02/20/18 0213  TROPONINI 0.09* 0.08*   ------------------------------------------------------------------------------------------------------------------  RADIOLOGY:  Dg Chest Port 1 View  Result Date: 02/23/2018 CLINICAL DATA:  Respiratory failure. EXAM: PORTABLE CHEST 1 VIEW COMPARISON:  Radiograph of February 22, 2018. FINDINGS: Stable cardiomediastinal silhouette. Endotracheal and nasogastric tubes are unchanged in position. Interval placement of right-sided PICC line with distal tip in expected position of the SVC. No pneumothorax or pleural effusion is noted. No acute pulmonary disease is noted. Bony thorax is unremarkable. IMPRESSION: Interval placement of right sided PICC line with tip in SVC. Otherwise stable support apparatus. No acute abnormality seen. Electronically Signed   By: Lupita RaiderJames  Green Jr, M.D.   On: 02/23/2018 08:22   Dg Chest Port 1  View  Result Date: 02/22/2018 CLINICAL DATA:  Status post intubation EXAM: PORTABLE CHEST 1 VIEW COMPARISON:  Film from earlier in the same day. FINDINGS: Endotracheal tube and nasogastric catheter are noted in satisfactory position. Cardiac shadow is stable. The lungs are well aerated without focal infiltrate.  Previously seen left basilar changes have resolved in the interval. IMPRESSION: Tubes and lines as described. Improved aeration in the left base. Electronically Signed   By: Alcide Clever M.D.   On: 02/22/2018 09:12   [place] Ekg Site Rite  Result Date: 02/22/2018 If Site Rite image not attached, placement could not be confirmed due to current cardiac rhythm.   ASSESSMENT AND PLAN:   Active Problems:   Respiratory failure (HCC)  47 year old male with history of hypertension, chronic back pain on chronic narcotics with back stimulator presents to the emergency room with 2 days of generalized weakness and unresponsiveness.  1.  Sepsis with acute hypoxic respiratory failure in the setting of pneumonia: Patient presents with tachypnea, leukocytosis and tachycardia S/p intubation Management as per intencivist.  2.  Pneumonia:    New fever 02/23/18- HD catheter removed and sent for culture Cefepime and vancomycin Follow-up on blood cultures  3.  Hyperkalemia: This was treated in the emergency room.  This is due to acute kidney injury. CRRT with help of nephrology.  K rising again, suggest to manage with lasix.  4.  Acute kidney injury in the setting of poor p.o. intake and sepsis Nephrology consultation Dialysis done. Now nephrologist is just monitoring renal function, it is improving and patient had good urine output.  5.  Hyponatremia from dehydration  6.  Elevated troponin: This is likely due to sepsis  echocardiogram  7.  Diabetes: Monitor as per ICU protocol   All the records are reviewed and case discussed with Care Management/Social Workerr. Management plans discussed with the patient, family and they are in agreement.  CODE STATUS: full.  TOTAL TIME TAKING CARE OF THIS PATIENT: 35 minutes.    POSSIBLE D/C IN 2-3 DAYS, DEPENDING ON CLINICAL CONDITION.   Altamese Dilling M.D on 02/24/2018   Between 7am to 6pm - Pager - [phone]  After 6pm go to  [email or web address] - Social research officer, government  [facility] [place] [facility]  Office  [phone]  CC: Primary care physician; Lauro Regulus, MD  Note: This dictation was prepared with Dragon dictation along with smaller phrase technology. Any transcriptional errors that result from this process are unintentional.

## 2018-02-24 NOTE — Progress Notes (Addendum)
CRITICAL CARE NOTE  CC  follow up respiratory failure  SUBJECTIVE Patient remains critically ill Prognosis is guarded On vent Increased WOB K down to 5 Dialysis cath removed     SIGNIFICANT EVENTS 9/16 remaon in vent failure to wean 9/17 remains on vent, failure to wean, fevers   BP 129/66   Pulse 92   Temp 99.7 F (37.6 C)   Resp (!) 31   Ht (S) _0  (1.93 m) Comment: per wife  Wt (!) (P) 138.9 kg   SpO2 94%   BMI (P) 37.27 kg/m    REVIEW OF SYSTEMS  PATIENT IS UNABLE TO PROVIDE COMPLETE REVIEW OF SYSTEMS DUE TO SEVERE CRITICAL ILLNESS   PHYSICAL EXAMINATION:  GENERAL:critically ill appearing, +resp distress HEAD: Normocephalic, atraumatic.  EYES: Pupils equal, round, reactive to light.  No scleral icterus.  MOUTH: Moist mucosal membrane. NECK: Supple. No thyromegaly. No nodules. No JVD.  PULMONARY: +rhonchi, +wheezing CARDIOVASCULAR: S1 and S2. Regular rate and rhythm. No murmurs, rubs, or gallops.  GASTROINTESTINAL: Soft, nontender, -distended. No masses. Positive bowel sounds. No hepatosplenomegaly.  MUSCULOSKELETAL: No swelling, clubbing, or edema.  NEUROLOGIC: obtunded, GCS<8 SKIN:intact,warm,dry      Indwelling Urinary Catheter continued, requirement due to   Reason to continue Indwelling Urinary Catheter for strict Intake/Output monitoring for hemodynamic instability   Central Line continued, requirement due to   Reason to continue [employer] Monitoring of central venous pressure or other hemodynamic parameters   Ventilator continued, requirement due to, resp failure    Ventilator Sedation RASS 0 to -2     ASSESSMENT AND PLAN SYNOPSIS 47 yo male with severe resp failure from pneumonia complicated by acute renal failure on Vent support s/p CRRT Failure to wean from vent  Severe Hypoxic and Hypercapnic Respiratory Failure -continue Full MV support -continue Bronchodilator Therapy -Wean Fio2 and PEEP as tolerated -will perform  SAT/SBT when respiratory parameters are met    Renal Failure-most likely due to ATN -follow chem 7 -follow UO -continue Foley Catheter-assess need daily Follow up nephrology recs K=5  NEUROLOGY - intubated and sedated - minimal sedation to achieve a RASS goal: -1 Wake up assessment pending   SEVERE COPD EXACERBATION -continue IV steroids as prescribed -continue NEB THERAPY as prescribed -morphine as needed -wean fio2 as needed and tolerated   CARDIAC ICU monitoring  ID -continue IV abx as prescibed -follow up cultures   ENDO - ICU hypoglycemic\Hyperglycemia protocol -check FSBS per protocol   ELECTROLYTES -follow labs as needed -replace as needed -pharmacy consultation and following   DVT/GI PRX ordered TRANSFUSIONS AS NEEDED MONITOR FSBS ASSESS the need for LABS as needed   Critical Care Time devoted to patient care services described in this note is 32 minutes.   Overall, patient is critically ill, prognosis is guarded.  Patient with Multiorgan failure and at high risk for cardiac arrest and death.    Corrin Parker, M.D.  [facility]  Medical Director [place] Director [facility] Cardio-Pulmonary Department

## 2018-02-24 NOTE — Progress Notes (Signed)
Constipation Note  Assessment 47 year old male with last bowel movement on 9/13.Patient on Oxycodone 10mg  q6h scheduled. Pharmacy to manage bowel regimen.  Plan Will give Miralax 17g packet once. Will continuesennosides-docusate sodium 8.6-50mg  tablet scheduledtwice daily. Will reassess patient daily.  Clovia CuffLisa Michelangelo Rindfleisch, PharmD, BCPS 02/24/2018 3:06 PM

## 2018-02-25 ENCOUNTER — Inpatient Hospital Stay: Payer: Medicare Other

## 2018-02-25 LAB — CBC WITH DIFFERENTIAL/PLATELET
Basophils Absolute: 0 10*3/uL (ref 0–0.1)
Basophils Relative: 0 %
EOS ABS: 0 10*3/uL (ref 0–0.7)
Eosinophils Relative: 0 %
HCT: 31.7 % — ABNORMAL LOW (ref 40.0–52.0)
HEMOGLOBIN: 10.8 g/dL — AB (ref 13.0–18.0)
LYMPHS ABS: 1.5 10*3/uL (ref 1.0–3.6)
LYMPHS PCT: 16 %
MCH: 32.1 pg (ref 26.0–34.0)
MCHC: 34.2 g/dL (ref 32.0–36.0)
MCV: 93.8 fL (ref 80.0–100.0)
MONOS PCT: 4 %
Monocytes Absolute: 0.4 10*3/uL (ref 0.2–1.0)
NEUTROS PCT: 80 %
Neutro Abs: 7.5 10*3/uL — ABNORMAL HIGH (ref 1.4–6.5)
Platelets: 184 10*3/uL (ref 150–440)
RBC: 3.38 MIL/uL — ABNORMAL LOW (ref 4.40–5.90)
RDW: 14.1 % (ref 11.5–14.5)
WBC: 9.4 10*3/uL (ref 3.8–10.6)

## 2018-02-25 LAB — GLUCOSE, CAPILLARY
GLUCOSE-CAPILLARY: 189 mg/dL — AB (ref 70–99)
GLUCOSE-CAPILLARY: 206 mg/dL — AB (ref 70–99)
GLUCOSE-CAPILLARY: 197 mg/dL — AB (ref 70–99)
GLUCOSE-CAPILLARY: 223 mg/dL — AB (ref 70–99)
GLUCOSE-CAPILLARY: 136 mg/dL — AB (ref 70–99)
GLUCOSE-CAPILLARY: 155 mg/dL — AB (ref 70–99)
GLUCOSE-CAPILLARY: 208 mg/dL — AB (ref 70–99)
GLUCOSE-CAPILLARY: 193 mg/dL — AB (ref 70–99)
GLUCOSE-CAPILLARY: 178 mg/dL — AB (ref 70–99)
GLUCOSE-CAPILLARY: 142 mg/dL — AB (ref 70–99)
GLUCOSE-CAPILLARY: 130 mg/dL — AB (ref 70–99)
GLUCOSE-CAPILLARY: 167 mg/dL — AB (ref 70–99)
Glucose-Capillary: 144 mg/dL — ABNORMAL HIGH (ref 70–99)
Glucose-Capillary: 144 mg/dL — ABNORMAL HIGH (ref 70–99)
Glucose-Capillary: 147 mg/dL — ABNORMAL HIGH (ref 70–99)
Glucose-Capillary: 151 mg/dL — ABNORMAL HIGH (ref 70–99)
Glucose-Capillary: 159 mg/dL — ABNORMAL HIGH (ref 70–99)
Glucose-Capillary: 173 mg/dL — ABNORMAL HIGH (ref 70–99)
Glucose-Capillary: 174 mg/dL — ABNORMAL HIGH (ref 70–99)
Glucose-Capillary: 180 mg/dL — ABNORMAL HIGH (ref 70–99)
Glucose-Capillary: 189 mg/dL — ABNORMAL HIGH (ref 70–99)
Glucose-Capillary: 205 mg/dL — ABNORMAL HIGH (ref 70–99)
Glucose-Capillary: 209 mg/dL — ABNORMAL HIGH (ref 70–99)

## 2018-02-25 LAB — BASIC METABOLIC PANEL
ANION GAP: 7 (ref 5–15)
BUN: 49 mg/dL — AB (ref 6–20)
CO2: 32 mmol/L (ref 22–32)
Calcium: 9.3 mg/dL (ref 8.9–10.3)
Chloride: 102 mmol/L (ref 98–111)
Creatinine, Ser: 0.81 mg/dL (ref 0.61–1.24)
GFR calc Af Amer: >60 mL/min (ref >60)
GFR calc non Af Amer: >60 mL/min (ref >60)
Glucose, Bld: 216 mg/dL — ABNORMAL HIGH (ref 70–99)
Potassium: 5.2 mmol/L — ABNORMAL HIGH (ref 3.5–5.1)
SODIUM: 141 mmol/L (ref 135–145)

## 2018-02-25 LAB — TRIGLYCERIDES: TRIGLYCERIDES: 590 mg/dL — AB (ref <150)

## 2018-02-25 MED ORDER — LORAZEPAM 2 MG/ML IJ SOLN
INTRAMUSCULAR | Status: AC
  Filled 2018-02-25: qty 2

## 2018-02-25 MED ORDER — SODIUM POLYSTYRENE SULFONATE 15 GM/60ML PO SUSP
30.0000 g | Freq: Once | ORAL | Status: AC
  Administered 2018-02-25: 30 g
  Filled 2018-02-25: qty 120

## 2018-02-25 MED ORDER — OXYCODONE HCL 5 MG PO TABS
20.0000 mg | ORAL_TABLET | Freq: Four times a day (QID) | ORAL | Status: DC
  Administered 2018-02-25 – 2018-02-27 (×6): 20 mg
  Filled 2018-02-25 (×6): qty 4

## 2018-02-25 MED ORDER — LORAZEPAM 2 MG/ML IJ SOLN
4.0000 mg | Freq: Once | INTRAMUSCULAR | Status: AC
  Administered 2018-02-25: 4 mg via INTRAVENOUS

## 2018-02-25 MED ORDER — MIDAZOLAM BOLUS VIA INFUSION
2.0000 mg | Freq: Once | INTRAVENOUS | Status: DC
  Filled 2018-02-25: qty 2

## 2018-02-25 MED ORDER — QUETIAPINE FUMARATE 25 MG PO TABS
50.0000 mg | ORAL_TABLET | Freq: Once | ORAL | Status: AC
  Administered 2018-02-25: 50 mg via ORAL
  Filled 2018-02-25: qty 2

## 2018-02-25 MED ORDER — SODIUM CHLORIDE 0.9 % IV SOLN
0.0000 mg/h | INTRAVENOUS | Status: DC
  Administered 2018-02-25: 1 mg/h via INTRAVENOUS
  Administered 2018-02-25 – 2018-02-26 (×3): 10 mg/h via INTRAVENOUS
  Filled 2018-02-25 (×5): qty 5

## 2018-02-25 MED ORDER — POLYETHYLENE GLYCOL 3350 17 G PO PACK: 17.0000 g | PACK | Freq: Once | ORAL | Status: DC

## 2018-02-25 NOTE — Progress Notes (Addendum)
Constipation Note  Assessment 47 year old male with last bowel movement on 9/13.Patient previously on oxycodone 10mg  q6h, now increased to 20 mg q6h scheduled. Pharmacy to manage bowel regimen.  Plan Will give Miralax 17g packet once. Will continuesennosides-docusate sodium 8.6-50mg  tablet scheduledtwice daily. Will reassess patient daily.  Bethanie DickerNithya Pothireddy, PharmD Candidate 02/25/2018 2:12 PM

## 2018-02-25 NOTE — Progress Notes (Signed)
CRITICAL CARE NOTE  CC  follow up respiratory failure  SUBJECTIVE Patient remains critically ill Prognosis is guarded On vent Failed weaning trails Increased WOB Severe resp muscle weakness    SIGNIFICANT EVENTS 9/16 remains in vent failure to wean 9/17 remains on vent, failure to wean, fevers 9/19 remains on vent, resp failure   BP (!) 100/50   Pulse 77   Temp (!) 100.8 F (38.2 C)   Resp (!) 25   Ht (S) '6\' 4"'  (1.93 m) Comment: per wife  Wt (!) 140.5 kg   SpO2 98%   BMI 37.70 kg/m    REVIEW OF SYSTEMS  PATIENT IS UNABLE TO PROVIDE COMPLETE REVIEW OF SYSTEMS DUE TO SEVERE CRITICAL ILLNESS   PHYSICAL EXAMINATION:  GENERAL:critically ill appearing, +resp distress HEAD: Normocephalic, atraumatic.  EYES: Pupils equal, round, reactive to light.  No scleral icterus.  MOUTH: Moist mucosal membrane. NECK: Supple. No thyromegaly. No nodules. No JVD.  PULMONARY: +rhonchi, +wheezing CARDIOVASCULAR: S1 and S2. Regular rate and rhythm. No murmurs, rubs, or gallops.  GASTROINTESTINAL: Soft, nontender, -distended. No masses. Positive bowel sounds. No hepatosplenomegaly.  MUSCULOSKELETAL: No swelling, clubbing, or edema.  NEUROLOGIC: obtunded, GCS<8 SKIN:intact,warm,dry      Indwelling Urinary Catheter continued, requirement due to   Reason to continue Indwelling Urinary Catheter for strict Intake/Output monitoring for hemodynamic instability   Central Line continued, requirement due to   Reason to continue [employer] Monitoring of central venous pressure or other hemodynamic parameters   Ventilator continued, requirement due to, resp failure    Ventilator Sedation RASS 0 to -2     ASSESSMENT AND PLAN SYNOPSIS 47 yo male with severe resp failure from pneumonia complicated by acute renal failure on Vent support s/p CRRT Failure to wean from vent-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  Severe Hypoxic and Hypercapnic Respiratory  Failure -continue Full MV support -continue Bronchodilator Therapy -Wean Fio2 and PEEP as tolerated -will perform SAT/SBT when respiratory parameters are met Patient may need trach and PEG tube if he fails to wean from vent  CARDIAC FAILURE-GRADE 1 diastolic dysfunction EF 00% -oxygen as needed -Lasix as tolerated -follow up cardiac enzymes as indicated   Renal Failure-resolved Follow up chem 7 Follow UOP   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 - intubated and sedated - minimal sedation to achieve a RASS goal: -1 Wake up assessment pending  SEVERE COPD EXACERBATION -continue IV steroids as prescribed -continue NEB THERAPY as prescribed -morphine as needed -wean fio2 as needed and tolerated   CARDIAC ICU monitoring  ID -continue IV abx as prescibed -follow up cultures  GI/Nutrition GI PROPHYLAXIS as indicated DIET-->TF's as tolerated Constipation protocol as indicated  ENDO - ICU hypoglycemic\Hyperglycemia protocol -check FSBS per protocol   ELECTROLYTES -follow labs as needed -replace as needed -pharmacy consultation and following   DVT/GI PRX ordered TRANSFUSIONS AS NEEDED MONITOR FSBS ASSESS the need for LABS as needed   Critical Care Time devoted to patient care services described in this note is 32 minutes.   Overall, patient is critically ill, prognosis is guarded.  Patient with Multiorgan failure and at high risk for cardiac arrest and death.    Corrin Parker, M.D.  [facility]  Medical Director [place] Director [facility] Cardio-Pulmonary Department

## 2018-02-25 NOTE — Progress Notes (Signed)
   02/25/18 1330  Clinical Encounter Type  Visited With Patient and family together  Visit Type Initial  Spiritual Encounters  Spiritual Needs Prayer   Chaplain checked in with patient and family.  Family requested prayer which chaplain led.  Chaplain reviewed ongoing support services and encouraged them to reach out as needed.

## 2018-02-25 NOTE — Progress Notes (Signed)
Improved SBT this AM, pt tolerated sedation off for approx 1/2 hour and SBT approx same.  He followed commands and nodded/shook head appropriately. Upper body strength slowly improving.  Propofol d/c d/t elevated triglycerides.  Pt unable to rest comfortably without this medication.  After about 1/2 hour he was continually bucking the vent and stacking breaths, biting down on the tube.  Coughing/gagging, tachypneic, tachycardic, hypertensive.  Fentanyl replaced with Dilaudid and eventually dilaudid was titrated to 10 mgs/hr.  Versed increased to 7 mgs/hr.  Also pushed 4 mgs ativan.  Minimal relief observed, pt continues to bite on tube and breathe 20/minute over the vent intermittently, alarming the vent.  HR in 120s and RR in 30s for the most part.   Xanax, increased Oxy dose, and one time dose Seroquel also administered with minimal effect. Pt able to indicate he needed to move his bowels this afternoon after writing RN administered suppository this AM,  Placed on bedpan and had moderate amt stool. Wife visited in early afternoon.  Patient is observed to be much calmer when his wife is in the room

## 2018-02-26 LAB — BLOOD GAS, ARTERIAL
ACID-BASE DEFICIT: 8.7 mmol/L — AB (ref 0.0–2.0)
ACID-BASE EXCESS: 13.8 mmol/L — AB (ref 0.0–2.0)
Allens test (pass/fail): POSITIVE — AB
BICARBONATE: 19.5 mmol/L — AB (ref 20.0–28.0)
BICARBONATE: 40.9 mmol/L — AB (ref 20.0–28.0)
FIO2: 100
FIO2: 0.4
MECHVT: 550 mL
O2 SAT: 91.1 %
O2 Saturation: 99.7 %
PATIENT TEMPERATURE: 37
PCO2 ART: 51 mmHg — AB (ref 32.0–48.0)
PEEP: 10 cmH2O
PO2 ART: 223 mmHg — AB (ref 83.0–108.0)
Patient temperature: 37
RATE: 20 resp/min
pCO2 arterial: 63 mmHg — ABNORMAL HIGH (ref 32.0–48.0)
pH, Arterial: 7.19 — CL (ref 7.350–7.450)
pH, Arterial: 7.42 (ref 7.350–7.450)
pO2, Arterial: 60 mmHg — ABNORMAL LOW (ref 83.0–108.0)

## 2018-02-26 LAB — BASIC METABOLIC PANEL
Anion gap: 6 (ref 5–15)
BUN: 49 mg/dL — AB (ref 6–20)
CO2: 34 mmol/L — ABNORMAL HIGH (ref 22–32)
Calcium: 9.2 mg/dL (ref 8.9–10.3)
Chloride: 104 mmol/L (ref 98–111)
Creatinine, Ser: 0.67 mg/dL (ref 0.61–1.24)
GFR calc Af Amer: >60 mL/min (ref >60)
GLUCOSE: 169 mg/dL — AB (ref 70–99)
POTASSIUM: 3.9 mmol/L (ref 3.5–5.1)
SODIUM: 144 mmol/L (ref 135–145)

## 2018-02-26 LAB — GLUCOSE, CAPILLARY
GLUCOSE-CAPILLARY: 126 mg/dL — AB (ref 70–99)
GLUCOSE-CAPILLARY: 141 mg/dL — AB (ref 70–99)
GLUCOSE-CAPILLARY: 159 mg/dL — AB (ref 70–99)
GLUCOSE-CAPILLARY: 169 mg/dL — AB (ref 70–99)
GLUCOSE-CAPILLARY: 211 mg/dL — AB (ref 70–99)
GLUCOSE-CAPILLARY: 215 mg/dL — AB (ref 70–99)
GLUCOSE-CAPILLARY: 263 mg/dL — AB (ref 70–99)
GLUCOSE-CAPILLARY: 272 mg/dL — AB (ref 70–99)
Glucose-Capillary: 151 mg/dL — ABNORMAL HIGH (ref 70–99)
Glucose-Capillary: 161 mg/dL — ABNORMAL HIGH (ref 70–99)
Glucose-Capillary: 165 mg/dL — ABNORMAL HIGH (ref 70–99)
Glucose-Capillary: 167 mg/dL — ABNORMAL HIGH (ref 70–99)
Glucose-Capillary: 153 mg/dL — ABNORMAL HIGH (ref 70–99)
Glucose-Capillary: 182 mg/dL — ABNORMAL HIGH (ref 70–99)
Glucose-Capillary: 139 mg/dL — ABNORMAL HIGH (ref 70–99)
Glucose-Capillary: 180 mg/dL — ABNORMAL HIGH (ref 70–99)
Glucose-Capillary: 205 mg/dL — ABNORMAL HIGH (ref 70–99)

## 2018-02-26 LAB — CBC
HCT: 35.3 % — ABNORMAL LOW (ref 40.0–52.0)
Hemoglobin: 11.8 g/dL — ABNORMAL LOW (ref 13.0–18.0)
MCH: 31.5 pg (ref 26.0–34.0)
MCHC: 33.4 g/dL (ref 32.0–36.0)
MCV: 94.2 fL (ref 80.0–100.0)
Platelets: 163 10*3/uL (ref 150–440)
RBC: 3.74 MIL/uL — ABNORMAL LOW (ref 4.40–5.90)
RDW: 14.2 % (ref 11.5–14.5)
WBC: 8.4 10*3/uL (ref 3.8–10.6)

## 2018-02-26 LAB — CATH TIP CULTURE

## 2018-02-26 LAB — TRIGLYCERIDES: TRIGLYCERIDES: 487 mg/dL — AB (ref <150)

## 2018-02-26 MED ORDER — HYDROMORPHONE HCL 1 MG/ML IJ SOLN: 0.5000 mg | Freq: Once | INTRAMUSCULAR | Status: DC

## 2018-02-26 MED ORDER — INSULIN DETEMIR 100 UNIT/ML ~~LOC~~ SOLN
20.0000 [IU] | Freq: Every day | SUBCUTANEOUS | Status: DC
  Filled 2018-02-26: qty 0.2

## 2018-02-26 MED ORDER — INSULIN ASPART 100 UNIT/ML ~~LOC~~ SOLN
0.0000 [IU] | SUBCUTANEOUS | Status: DC
  Administered 2018-02-26 – 2018-02-27 (×3): 11 [IU] via SUBCUTANEOUS
  Administered 2018-02-27: 15 [IU] via SUBCUTANEOUS
  Administered 2018-02-27: 11 [IU] via SUBCUTANEOUS
  Administered 2018-02-27: 15 [IU] via SUBCUTANEOUS
  Administered 2018-02-27 (×2): 11 [IU] via SUBCUTANEOUS
  Administered 2018-02-28: 7 [IU] via SUBCUTANEOUS
  Administered 2018-02-28 (×2): 11 [IU] via SUBCUTANEOUS
  Administered 2018-02-28: 7 [IU] via SUBCUTANEOUS
  Filled 2018-02-26 (×13): qty 1

## 2018-02-26 MED ORDER — ORAL CARE MOUTH RINSE: 15.0000 mL | Freq: Two times a day (BID) | OROMUCOSAL | Status: DC

## 2018-02-26 MED ORDER — IPRATROPIUM-ALBUTEROL 0.5-2.5 (3) MG/3ML IN SOLN
3.0000 mL | RESPIRATORY_TRACT | Status: DC | PRN
  Administered 2018-02-26 – 2018-02-27 (×2): 3 mL via RESPIRATORY_TRACT

## 2018-02-26 MED ORDER — INSULIN DETEMIR 100 UNIT/ML ~~LOC~~ SOLN
20.0000 [IU] | Freq: Every day | SUBCUTANEOUS | Status: DC
  Administered 2018-02-26: 20 [IU] via SUBCUTANEOUS
  Filled 2018-02-26 (×3): qty 0.2

## 2018-02-26 MED ORDER — SODIUM CHLORIDE 0.9 % IV SOLN
2.0000 g | INTRAVENOUS | Status: DC
  Administered 2018-02-26 – 2018-02-28 (×11): 2 g via INTRAVENOUS
  Filled 2018-02-26 (×7): qty 2000
  Filled 2018-02-26 (×2): qty 2
  Filled 2018-02-26 (×3): qty 2000
  Filled 2018-02-26 (×3): qty 2
  Filled 2018-02-26 (×3): qty 2000
  Filled 2018-02-26: qty 2
  Filled 2018-02-26: qty 2000

## 2018-02-26 MED ORDER — IPRATROPIUM-ALBUTEROL 0.5-2.5 (3) MG/3ML IN SOLN
3.0000 mL | Freq: Four times a day (QID) | RESPIRATORY_TRACT | Status: DC
  Administered 2018-02-27 – 2018-02-28 (×4): 3 mL via RESPIRATORY_TRACT
  Filled 2018-02-26 (×6): qty 3

## 2018-02-26 NOTE — Progress Notes (Addendum)
CRITICAL CARE NOTE  CC  follow up respiratory failure  SUBJECTIVE Patient remains critically ill Prognosis is guarded Follows simple commands     SIGNIFICANT EVENTS None   BP 138/79 (BP Location: Left Arm)   Pulse 90   Temp 97.9 F (36.6 C) (Oral)   Resp 20   Ht (S) 6\' 4"  (1.93 m) Comment: per wife  Wt (!) 141 kg   SpO2 94%   BMI 37.84 kg/m    REVIEW OF SYSTEMS  PATIENT IS UNABLE TO PROVIDE COMPLETE REVIEW OF SYSTEMS DUE TO SEVERE CRITICAL ILLNESS   PHYSICAL EXAMINATION:  GENERAL:critically ill appearing, no resp distress HEAD: Normocephalic, atraumatic.  EYES: Pupils equal, round, reactive to light.  No scleral icterus.  MOUTH: Moist mucosal membrane. NECK: Supple. No thyromegaly. No nodules. No JVD.  PULMONARY: +rhonchi, no wheezing CARDIOVASCULAR: S1 and S2. Regular rate and rhythm. No murmurs, rubs, or gallops.  GASTROINTESTINAL: Soft, nontender, mildly distended. No masses. Positive bowel sounds. No hepatosplenomegaly.  MUSCULOSKELETAL: No swelling, clubbing, or edema.  NEUROLOGIC: opens eyes, follows simple commands, moves all extremeties SKIN:intact,warm,dry      Indwelling Urinary Catheter continued, requirement due to   Reason to continue Indwelling Urinary Catheter for strict Intake/Output monitoring for hemodynamic instability   Central Line continued, requirement due to   Reason to continue [employer] Monitoring of central venous pressure or other hemodynamic parameters   Ventilator continued, requirement due to, resp failure    Ventilator Sedation RASS 0 to -2     ASSESSMENT AND PLAN SYNOPSIS 47 yo male with severe resp failure from pneumonia complicated by acute renal failure on Vent support s/p CRRT Failure to wean from vent-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 - intubated and sedated - minimal sedation to achieve a RASS goal: -1   SEVERE COPD EXACERBATION -continue IV steroids as prescribed -continue NEB THERAPY as prescribed -wean fio2 as needed and tolerated   CARDIACCARDIAC FAILURE-GRADE 1 diastolic dysfunction EF 60% -oxygen as needed -Lasix as tolerated -follow up cardiac enzymes as indicated    ICU monitoring  ID -continue IV abx as prescibed -follow up cultures  GI/Nutrition GI PROPHYLAXIS as indicated DIET-->TF's as tolerated Constipation protocol as indicated  ENDO - ICU hypoglycemic\Hyperglycemia protocol -check FSBS per protocol Wean insulin drip as tolerated   ELECTROLYTES -follow labs as needed -replace as needed -pharmacy consultation and following   DVT/GI PRX ordered TRANSFUSIONS AS NEEDED MONITOR FSBS ASSESS the need for LABS as needed  Arbie CookeyKhalid Sharah Finnell, MD  02/26/2018 8:10 AM [facility]    Catheter tip sent for c/s grew the E.faecalis which is sensitive to ampicillin. We will draw blood c/s and rx with ampicillin for 7 days, He was extubated earlier and is doing well on 6lo2. He likely will need BIPAP during the night.
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
## 2018-02-26 NOTE — Progress Notes (Signed)
[employer] - [place] at [facility]   PATIENT NAME: Martin Riddle    MR#:  [id number]  DATE OF BIRTH:  10/05/1970  SUBJECTIVE:  CHIEF COMPLAINT:   Chief Complaint  Patient presents with  . Weakness   Brought with altered mental status and noted to have respiratory failure and intubated.  Septic shock with multiorgan failure and required dialysis initially.  Blood pressure stable. Renal function improved, have good urine output.  Failed SBT.  Had some fever , HD catheter removed. Finally extubated 02/26/18.  REVIEW OF SYSTEMS:     Review of Systems  Constitutional: Negative for fever, malaise/fatigue and weight loss.  HENT: Negative for congestion, ear discharge and hearing loss.   Eyes: Negative for blurred vision, photophobia and discharge.  Respiratory: Negative for cough, sputum production and shortness of breath.   Cardiovascular: Negative for chest pain and leg swelling.  Gastrointestinal: Negative for abdominal pain, heartburn, nausea and vomiting.  Genitourinary: Negative for dysuria and frequency.  Musculoskeletal: Negative for back pain and myalgias.  Skin: Negative for rash.  Neurological: Negative for dizziness, tremors, focal weakness and seizures.  Psychiatric/Behavioral: Negative for depression and hallucinations.    DRUG ALLERGIES:   Allergies  Allergen Reactions  . Morphine And Related Other (See Comments)    headache    VITALS:  Blood pressure 124/72, pulse 99, temperature 100.1 F (37.8 C), temperature source Axillary, resp. rate 13, height (S) 6\' 4"  (1.93 m), weight (!) 141 kg, SpO2 (!) 86 %.  PHYSICAL EXAMINATION:  GENERAL:  47 y.o.-year-old patient lying in the bed with no acute distress.  EYES: Pupils equal, round, reactive to light and accommodation. No scleral icterus. Extraocular muscles intact.  HEENT: Head atraumatic, normocephalic. Oropharynx and nasopharynx clear. Extubated. NECK:  Supple, no jugular venous distention. No  thyroid enlargement, no tenderness.  LUNGS: Normal breath sounds bilaterally, no wheezing, rales,rhonchi or crepitation. No use of accessory muscles of respiration.  CARDIOVASCULAR: S1, S2 normal. No murmurs, rubs, or gallops.  ABDOMEN: Soft, nontender, nondistended. Bowel sounds present. No organomegaly or mass.  EXTREMITIES: No pedal edema, cyanosis, or clubbing.  NEUROLOGIC: Patient is alert and oriented now.  Moves all 4 limbs, have generalized weakness, sensations intact.Marland Kitchen. PSYCHIATRIC: Oriented x3.  SKIN: No obvious rash, lesion, or ulcer.   Physical Exam LABORATORY PANEL:   CBC Recent Labs  Lab 02/26/18 0523  WBC 8.4  HGB 11.8*  HCT 35.3*  PLT 163   ------------------------------------------------------------------------------------------------------------------  Chemistries  Recent Labs  Lab 02/22/18 0419 02/23/18 0414  02/26/18 0523  NA 142 138   < > 144  K 5.3* 6.3*   < > 3.9  CL 105 102   < > 104  CO2 32 27   < > 34*  GLUCOSE 181* 363*   < > 169*  BUN 43* 46*   < > 49*  CREATININE 1.03 0.91   < > 0.67  CALCIUM 8.9 9.1   < > 9.2  MG  --  2.0  --   --   AST 29  --   --   --   ALT 19  --   --   --   ALKPHOS 58  --   --   --   BILITOT 0.7  --   --   --    < > = values in this interval not displayed.   ------------------------------------------------------------------------------------------------------------------  Cardiac Enzymes Recent Labs  Lab 02/19/18 2107 02/20/18 0213  TROPONINI 0.09* 0.08*   ------------------------------------------------------------------------------------------------------------------  RADIOLOGY:  Dg Chest Port 1 View  Result Date: 02/25/2018 CLINICAL DATA:  Acute respiratory failure EXAM: PORTABLE CHEST 1 VIEW COMPARISON:  02/23/2018 chest radiograph. FINDINGS: Endotracheal tube tip is 5.2 cm above the carina. Enteric tube enters stomach with the tip not seen on this image. Right PICC terminates in the lower third of the  SVC. Stable cardiomediastinal silhouette with top-normal heart size. No pneumothorax. No pleural effusion. Mild bibasilar atelectasis. No pulmonary edema. IMPRESSION: Well-positioned support structures.  Mild bibasilar atelectasis. Electronically Signed   By: Delbert Phenix M.D.   On: 02/25/2018 09:52    ASSESSMENT AND PLAN:   Active Problems:   Respiratory failure (HCC)  47 year old male with history of hypertension, chronic back pain on chronic narcotics with back stimulator presents to the emergency room with 2 days of generalized weakness and unresponsiveness.  1.  Sepsis with acute hypoxic respiratory failure in the setting of pneumonia: Patient presents with tachypnea, leukocytosis and tachycardia S/p intubation- extubated 02/26/18 Management as per intencivist.   May need Bipap at night.  2.  Pneumonia:     New fever 02/23/18- HD catheter removed and sent for culture- negative. Cefepime and vancomycin Follow-up on blood cultures- negative so far.  3.  Hyperkalemia: This was treated in the emergency room.  This is due to acute kidney injury. CRRT with help of nephrology.  K rising again, suggest to manage with lasix. Came down.   4.  Acute kidney injury in the setting of poor p.o. intake and sepsis Nephrology consultation Dialysis done. Now nephrologist is just monitoring renal function, it is improving and patient had good urine output.  5.  Hyponatremia from dehydration- improved.  6.  Elevated troponin: This is likely due to sepsis  echocardiogram  7.  Diabetes: Monitor as per ICU protocol   All the records are reviewed and case discussed with Care Management/Social Workerr. Management plans discussed with the patient, family and they are in agreement.  CODE STATUS: full.  TOTAL TIME TAKING CARE OF THIS PATIENT: 35 minutes.    POSSIBLE D/C IN 2-3?? DAYS, DEPENDING ON CLINICAL CONDITION.   Altamese Dilling M.D on 02/26/2018   Between 7am to 6pm - Pager  - [phone]  After 6pm go to [email or web address] - Social research officer, government  [facility] [place] [facility]  Office  [phone]  CC: Primary care physician; Lauro Regulus, MD  Note: This dictation was prepared with Dragon dictation along with smaller phrase technology. Any transcriptional errors that result from this process are unintentional.

## 2018-02-26 NOTE — Progress Notes (Signed)
Pt extubated without complications, no stridor noted, good strong cough, placed on 6lpm [place] sats 91%

## 2018-02-26 NOTE — Progress Notes (Signed)
[employer] - [place] at [facility]   PATIENT NAME: Martin Riddle    MR#:  [id number]  DATE OF BIRTH:  11/11/1970  SUBJECTIVE:  CHIEF COMPLAINT:   Chief Complaint  Patient presents with  . Weakness   Brought with altered mental status and noted to have respiratory failure and intubated.  Septic shock with multiorgan failure and required dialysis initially.  Blood pressure stable. Renal function improved, have good urine output.  Failed SBT.  Had some fever , HD catheter removed.  failed SBT daily.  REVIEW OF SYSTEMS:   Patient is intubated and sedated.  ROS  DRUG ALLERGIES:   Allergies  Allergen Reactions  . Morphine And Related Other (See Comments)    headache    VITALS:  Blood pressure 138/79, pulse 90, temperature 97.9 F (36.6 C), temperature source Oral, resp. rate 20, height (S) 6\' 4"  (1.93 m), weight (!) 141 kg, SpO2 94 %.  PHYSICAL EXAMINATION:  GENERAL:  47 y.o.-year-old patient lying in the bed with no acute distress.  EYES: Pupils equal, round, reactive to light and accommodation. No scleral icterus. Extraocular muscles intact.  HEENT: Head atraumatic, normocephalic. Oropharynx and nasopharynx clear.  NECK:  Supple, no jugular venous distention. No thyroid enlargement, no tenderness.  LUNGS: Normal breath sounds bilaterally, no wheezing, rales,rhonchi or crepitation. No use of accessory muscles of respiration.  CARDIOVASCULAR: S1, S2 normal. No murmurs, rubs, or gallops.  ABDOMEN: Soft, nontender, nondistended. Bowel sounds present. No organomegaly or mass.  EXTREMITIES: No pedal edema, cyanosis, or clubbing.  NEUROLOGIC: Patient is sedated and intubated currently. PSYCHIATRIC: Sedated and intubated.  SKIN: No obvious rash, lesion, or ulcer.   Physical Exam LABORATORY PANEL:   CBC Recent Labs  Lab 02/26/18 0523  WBC 8.4  HGB 11.8*  HCT 35.3*  PLT 163    ------------------------------------------------------------------------------------------------------------------  Chemistries  Recent Labs  Lab 02/22/18 0419 02/23/18 0414  02/26/18 0523  NA 142 138   < > 144  K 5.3* 6.3*   < > 3.9  CL 105 102   < > 104  CO2 32 27   < > 34*  GLUCOSE 181* 363*   < > 169*  BUN 43* 46*   < > 49*  CREATININE 1.03 0.91   < > 0.67  CALCIUM 8.9 9.1   < > 9.2  MG  --  2.0  --   --   AST 29  --   --   --   ALT 19  --   --   --   ALKPHOS 58  --   --   --   BILITOT 0.7  --   --   --    < > = values in this interval not displayed.   ------------------------------------------------------------------------------------------------------------------  Cardiac Enzymes Recent Labs  Lab 02/19/18 2107 02/20/18 0213  TROPONINI 0.09* 0.08*   ------------------------------------------------------------------------------------------------------------------  RADIOLOGY:  Dg Chest Port 1 View  Result Date: 02/25/2018 CLINICAL DATA:  Acute respiratory failure EXAM: PORTABLE CHEST 1 VIEW COMPARISON:  02/23/2018 chest radiograph. FINDINGS: Endotracheal tube tip is 5.2 cm above the carina. Enteric tube enters stomach with the tip not seen on this image. Right PICC terminates in the lower third of the SVC. Stable cardiomediastinal silhouette with top-normal heart size. No pneumothorax. No pleural effusion. Mild bibasilar atelectasis. No pulmonary edema. IMPRESSION: Well-positioned support structures.  Mild bibasilar atelectasis. Electronically Signed   By: Delbert PhenixJason A Poff M.D.   On: 02/25/2018 09:52    ASSESSMENT AND PLAN:  Active Problems:   Respiratory failure (HCC)  47 year old male with history of hypertension, chronic back pain on chronic narcotics with back stimulator presents to the emergency room with 2 days of generalized weakness and unresponsiveness.  1.  Sepsis with acute hypoxic respiratory failure in the setting of pneumonia: Patient presents with  tachypnea, leukocytosis and tachycardia S/p intubation Management as per intencivist. Failed SBT.  2.  Pneumonia:    New fever 02/23/18- HD catheter removed and sent for culture- negative. Cefepime and vancomycin Follow-up on blood cultures- negative so far.  3.  Hyperkalemia: This was treated in the emergency room.  This is due to acute kidney injury. CRRT with help of nephrology.  K rising again, suggest to manage with lasix. Came down.   4.  Acute kidney injury in the setting of poor p.o. intake and sepsis Nephrology consultation Dialysis done. Now nephrologist is just monitoring renal function, it is improving and patient had good urine output.  5.  Hyponatremia from dehydration- improved.  6.  Elevated troponin: This is likely due to sepsis  echocardiogram  7.  Diabetes: Monitor as per ICU protocol   All the records are reviewed and case discussed with Care Management/Social Workerr. Management plans discussed with the patient, family and they are in agreement.  CODE STATUS: full.  TOTAL TIME TAKING CARE OF THIS PATIENT: 35 minutes.    POSSIBLE D/C IN 2-3?? DAYS, DEPENDING ON CLINICAL CONDITION.   Altamese Dilling M.D on 02/26/2018   Between 7am to 6pm - Pager - [phone]  After 6pm go to [email or web address] - Social research officer, government  [facility] [place] [facility]  Office  [phone]  CC: Primary care physician; Lauro Regulus, MD  Note: This dictation was prepared with Dragon dictation along with smaller phrase technology. Any transcriptional errors that result from this process are unintentional.

## 2018-02-27 ENCOUNTER — Inpatient Hospital Stay: Payer: Medicare Other

## 2018-02-27 LAB — GLUCOSE, CAPILLARY
GLUCOSE-CAPILLARY: 275 mg/dL — AB (ref 70–99)
GLUCOSE-CAPILLARY: 292 mg/dL — AB (ref 70–99)
GLUCOSE-CAPILLARY: 276 mg/dL — AB (ref 70–99)
GLUCOSE-CAPILLARY: 317 mg/dL — AB (ref 70–99)
Glucose-Capillary: 283 mg/dL — ABNORMAL HIGH (ref 70–99)
Glucose-Capillary: 285 mg/dL — ABNORMAL HIGH (ref 70–99)
Glucose-Capillary: 328 mg/dL — ABNORMAL HIGH (ref 70–99)
Glucose-Capillary: 354 mg/dL — ABNORMAL HIGH (ref 70–99)

## 2018-02-27 LAB — BASIC METABOLIC PANEL
Anion gap: 10 (ref 5–15)
BUN: 43 mg/dL — AB (ref 6–20)
CHLORIDE: 100 mmol/L (ref 98–111)
CO2: 32 mmol/L (ref 22–32)
CREATININE: 0.79 mg/dL (ref 0.61–1.24)
Calcium: 9.7 mg/dL (ref 8.9–10.3)
Glucose, Bld: 293 mg/dL — ABNORMAL HIGH (ref 70–99)
POTASSIUM: 3.7 mmol/L (ref 3.5–5.1)
SODIUM: 142 mmol/L (ref 135–145)

## 2018-02-27 LAB — CBC
HCT: 36.2 % — ABNORMAL LOW (ref 40.0–52.0)
HEMOGLOBIN: 12.2 g/dL — AB (ref 13.0–18.0)
MCH: 31.1 pg (ref 26.0–34.0)
MCHC: 33.6 g/dL (ref 32.0–36.0)
MCV: 92.4 fL (ref 80.0–100.0)
PLATELETS: 165 10*3/uL (ref 150–440)
RBC: 3.91 MIL/uL — AB (ref 4.40–5.90)
RDW: 14.2 % (ref 11.5–14.5)
WBC: 11.8 10*3/uL — ABNORMAL HIGH (ref 3.8–10.6)

## 2018-02-27 LAB — C DIFFICILE QUICK SCREEN W PCR REFLEX
C DIFFICILE (CDIFF) TOXIN: NEGATIVE
C Diff antigen: NEGATIVE
C Diff interpretation: NOT DETECTED

## 2018-02-27 MED ORDER — HYDROCHLOROTHIAZIDE 12.5 MG PO CAPS
12.5000 mg | ORAL_CAPSULE | Freq: Two times a day (BID) | ORAL | Status: DC
  Administered 2018-02-27 – 2018-02-28 (×3): 12.5 mg via ORAL
  Filled 2018-02-27 (×4): qty 1

## 2018-02-27 MED ORDER — ACETAMINOPHEN 325 MG PO TABS
650.0000 mg | ORAL_TABLET | Freq: Four times a day (QID) | ORAL | Status: DC | PRN
  Filled 2018-02-27: qty 2

## 2018-02-27 MED ORDER — ADULT MULTIVITAMIN W/MINERALS CH
1.0000 | ORAL_TABLET | Freq: Every day | ORAL | Status: DC
  Administered 2018-02-27 – 2018-02-28 (×2): 1 via ORAL
  Filled 2018-02-27 (×2): qty 1

## 2018-02-27 MED ORDER — SODIUM CHLORIDE 0.9% FLUSH
10.0000 mL | Freq: Two times a day (BID) | INTRAVENOUS | Status: DC
  Administered 2018-02-27 – 2018-02-28 (×2): 10 mL

## 2018-02-27 MED ORDER — INSULIN DETEMIR 100 UNIT/ML ~~LOC~~ SOLN
10.0000 [IU] | Freq: Once | SUBCUTANEOUS | Status: AC
  Administered 2018-02-27: 10 [IU] via SUBCUTANEOUS
  Filled 2018-02-27: qty 0.1

## 2018-02-27 MED ORDER — INSULIN ASPART 100 UNIT/ML ~~LOC~~ SOLN
5.0000 [IU] | Freq: Three times a day (TID) | SUBCUTANEOUS | Status: DC
  Administered 2018-02-27 – 2018-02-28 (×3): 5 [IU] via SUBCUTANEOUS
  Filled 2018-02-27 (×4): qty 1

## 2018-02-27 MED ORDER — OXYCODONE HCL 5 MG PO TABS
20.0000 mg | ORAL_TABLET | ORAL | Status: DC | PRN
  Administered 2018-02-27 – 2018-02-28 (×6): 20 mg
  Filled 2018-02-27 (×7): qty 4

## 2018-02-27 MED ORDER — SODIUM CHLORIDE 0.9% FLUSH: 10.0000 mL | INTRAVENOUS | Status: DC | PRN

## 2018-02-27 MED ORDER — METRONIDAZOLE 500 MG PO TABS
500.0000 mg | ORAL_TABLET | Freq: Three times a day (TID) | ORAL | Status: DC
  Administered 2018-02-27: 500 mg via ORAL
  Filled 2018-02-27 (×3): qty 1

## 2018-02-27 MED ORDER — SODIUM CHLORIDE 0.9 % IV SOLN
INTRAVENOUS | Status: DC | PRN
  Administered 2018-02-27: 500 mL via INTRAVENOUS

## 2018-02-27 MED ORDER — LISINOPRIL 20 MG PO TABS
20.0000 mg | ORAL_TABLET | Freq: Two times a day (BID) | ORAL | Status: DC
  Administered 2018-02-27 – 2018-02-28 (×3): 20 mg via ORAL
  Filled 2018-02-27 (×3): qty 1

## 2018-02-27 MED ORDER — INSULIN DETEMIR 100 UNIT/ML ~~LOC~~ SOLN
20.0000 [IU] | Freq: Two times a day (BID) | SUBCUTANEOUS | Status: DC
  Administered 2018-02-27 – 2018-02-28 (×3): 20 [IU] via SUBCUTANEOUS
  Filled 2018-02-27 (×5): qty 0.2

## 2018-02-27 NOTE — Progress Notes (Addendum)
CRITICAL CARE NOTE  CC  follow up respiratory failure  SUBJECTIVE Feels better + diarrhea   SIGNIFICANT EVENTS Diarrhea Didn't tolerate bipap    BP (!) 150/73   Pulse 99   Temp 100.2 F (37.9 C)   Resp (!) 32   Ht (S) 6\' 4"  (1.93 m) Comment: per wife  Wt 133.7 kg   SpO2 97%   BMI 35.88 kg/m    REVIEW OF SYSTEMS  No CP, sob, n/v,+ diarrhea. No headaches PHYSICAL EXAMINATION:  GENERAL:critically ill appearing, +resp distress HEAD: Normocephalic, atraumatic.  EYES: Pupils equal, round, reactive to light.  No scleral icterus.  MOUTH: Moist mucosal membrane. NECK: Supple. No thyromegaly. No nodules. No JVD.  PULMONARY: few basal rhonciCARDIOVASCULAR: S1 and S2. Regular rate and rhythm. No murmurs, rubs, or gallops.  GASTROINTESTINAL: Soft, nontender, -distended. No masses. Positive bowel sounds. No hepatosplenomegaly.  MUSCULOSKELETAL: No swelling, clubbing, or edema.  NEUROLOGIC: non focal, awake and alert SKIN:intact,warm,dry  INTAKE/OUTPUT  Intake/Output Summary (Last 24 hours) at 02/27/2018 1225 Last data filed at 02/27/2018 1010 Gross per 24 hour  Intake 666.91 ml  Output 3075 ml  Net -2408.09 ml    LABS  CBC Recent Labs  Lab 02/25/18 0557 02/26/18 0523 02/27/18 0543  WBC 9.4 8.4 11.8*  HGB 10.8* 11.8* 12.2*  HCT 31.7* 35.3* 36.2*  PLT 184 163 165   Coag's No results for input(s): APTT, INR in the last 168 hours. BMET Recent Labs  Lab 02/25/18 0557 02/26/18 0523 02/27/18 0543  NA 141 144 142  K 5.2* 3.9 3.7  CL 102 104 100  CO2 32 34* 32  BUN 49* 49* 43*  CREATININE 0.81 0.67 0.79  GLUCOSE 216* 169* 293*   Electrolytes Recent Labs  Lab 02/23/18 0414  02/25/18 0557 02/26/18 0523 02/27/18 0543  CALCIUM 9.1   < > 9.3 9.2 9.7  MG 2.0  --   --   --   --   PHOS 4.2  --   --   --   --    < > = values in this interval not displayed.   Sepsis Markers Recent Labs  Lab 02/21/18 0328 02/22/18 0419 02/23/18 0414  PROCALCITON 4.33  2.52 1.30   ABG Recent Labs  Lab 02/22/18 2229 02/26/18 0954  PHART 7.37 7.42  PCO2ART 49* 63*  PO2ART 77* 60*   Liver Enzymes Recent Labs  Lab 02/22/18 0419  AST 29  ALT 19  ALKPHOS 58  BILITOT 0.7  ALBUMIN 2.8*   Cardiac Enzymes No results for input(s): TROPONINI, PROBNP in the last 168 hours. Glucose Recent Labs  Lab 02/26/18 1757 02/26/18 2011 02/27/18 0004 02/27/18 0417 02/27/18 0739 02/27/18 1157  GLUCAP 263* 272* 285* 275* 283* 328*     Recent Results (from the past 240 hour(s))  Blood culture (routine x 2)     Status: None   Collection Time: 02/19/18 11:02 AM  Result Value Ref Range Status   Specimen Description BLOOD LEFT ANTECUBITAL  Final   Special Requests   Final    BOTTLES DRAWN AEROBIC AND ANAEROBIC Blood Culture results may not be optimal due to an inadequate volume of blood received in culture bottles   Culture   Final    NO GROWTH 5 DAYS Performed at [facility], [address] [place], [place] [address]    Report Status 02/24/2018 FINAL  Final  Urine culture     Status: None   Collection Time: 02/19/18 11:36 AM  Result Value Ref  Range Status   Specimen Description   Final    URINE, CLEAN CATCH Performed at [facility], [address], [place], [place] [address]    Special Requests   Final    NONE Performed at [facility], [address] [place], [place] [address]    Culture   Final    NO GROWTH Performed at [facility], [address], [place], [place] [address]    Report Status 02/20/2018 FINAL  Final  Blood culture (routine x 2)     Status: None   Collection Time: 02/19/18 11:55 AM  Result Value Ref Range Status   Specimen Description BLOOD RIGHT ANTECUBITAL  Final   Special Requests   Final    BOTTLES DRAWN AEROBIC AND ANAEROBIC Blood Culture adequate volume   Culture   Final    NO GROWTH 5 DAYS Performed at [facility], [address] [place], [place] [address]     Report Status 02/24/2018 FINAL  Final  MRSA PCR Screening     Status: None   Collection Time: 02/19/18  2:31 PM  Result Value Ref Range Status   MRSA by PCR NEGATIVE NEGATIVE Final    Comment:        The GeneXpert MRSA Assay (FDA approved for NASAL specimens only), is one component of a comprehensive MRSA colonization surveillance program. It is not intended to diagnose MRSA infection nor to guide or monitor treatment for MRSA infections. Performed at [facility], [address] [place], [place] [address]   Culture, respiratory     Status: None   Collection Time: 02/22/18  6:00 AM  Result Value Ref Range Status   Specimen Description   Final    TRACHEAL ASPIRATE Performed at [facility], [address] [place], [place] [address]    Special Requests   Final    NONE Performed at [facility], [address] [place], [place] [address]    Gram Stain   Final    NO WBC SEEN RARE GRAM VARIABLE ROD RARE GRAM POSITIVE COCCI IN PAIRS    Culture   Final    MODERATE Consistent with normal respiratory flora. Performed at [facility], [address], [place], [place] [address]    Report Status 02/24/2018 FINAL  Final  CULTURE, BLOOD (ROUTINE X 2) w Reflex to ID Panel     Status: None (Preliminary result)   Collection Time: 02/23/18  4:37 AM  Result Value Ref Range Status   Specimen Description BLOOD LEFT WRIST  Final   Special Requests   Final    BOTTLES DRAWN AEROBIC AND ANAEROBIC Blood Culture adequate volume   Culture   Final    NO GROWTH 4 DAYS Performed at [facility], [address], [place], [place] [address]    Report Status PENDING  Incomplete  CULTURE, BLOOD (ROUTINE X 2) w Reflex to ID Panel     Status: None (Preliminary result)   Collection Time: 02/23/18  4:48 AM  Result Value Ref Range Status   Specimen Description BLOOD LEFT HAND  Final   Special Requests   Final    BOTTLES DRAWN AEROBIC AND ANAEROBIC Blood  Culture adequate volume   Culture   Final    NO GROWTH 4 DAYS Performed at [facility], [address] [place], [place] [address]    Report Status PENDING  Incomplete  Urine Culture     Status: None   Collection  Time: 02/23/18  4:51 AM  Result Value Ref Range Status   Specimen Description   Final    URINE, RANDOM Performed at [facility], [address] [place], [place] [address]    Special Requests   Final    NONE Performed at [facility], [address], [place], [place] [address]    Culture   Final    NO GROWTH Performed at [facility], [address], [place], [place] [address]    Report Status 02/24/2018 FINAL  Final  Cath Tip Culture     Status: Abnormal   Collection Time: 02/23/18  9:45 AM  Result Value Ref Range Status   Specimen Description   Final    CATH TIP Performed at [facility], [address] [place], [place] [address]    Special Requests   Final    NONE Performed at [facility], [address] [place], [place] [address]    Culture 1,000 COLONIES/mL ENTEROCOCCUS FAECALIS (A)  Final   Report Status 02/26/2018 FINAL  Final   Organism ID, Bacteria ENTEROCOCCUS FAECALIS (A)  Final      Susceptibility   Enterococcus faecalis - MIC*    AMPICILLIN <=2 SENSITIVE Sensitive     VANCOMYCIN 1 SENSITIVE Sensitive     GENTAMICIN SYNERGY SENSITIVE Sensitive     * 1,000 COLONIES/mL ENTEROCOCCUS FAECALIS  Culture, blood (single) w Reflex to ID Panel     Status: None (Preliminary result)   Collection Time: 02/26/18  3:06 PM  Result Value Ref Range Status   Specimen Description BLOOD BLOOD LEFT HAND  Final   Special Requests   Final    BOTTLES DRAWN AEROBIC AND ANAEROBIC Blood Culture adequate volume   Culture   Final    NO GROWTH < 24 HOURS Performed at [facility], [address] [place], [place] [address]    Report Status PENDING  Incomplete  C difficile quick scan w PCR reflex      Status: None   Collection Time: 02/27/18  9:56 AM  Result Value Ref Range Status   C Diff antigen NEGATIVE NEGATIVE Final   C Diff toxin NEGATIVE NEGATIVE Final   C Diff interpretation No C. difficile detected.  Final    Comment: Performed at [facility], [address] [place], [place] [address]    MEDICATIONS   Current Facility-Administered Medications:  .  acetaminophen (TYLENOL) suppository 650 mg, 650 mg, Rectal, Q4H PRN, Conforti, John, DO, 650 mg at 02/21/18 2207 .  ALPRAZolam Prudy Feeler) tablet 0.5 mg, 0.5 mg, Per Tube, TID, Erin Fulling, MD, 0.5 mg at 02/27/18 0924 .  ampicillin (OMNIPEN) 2 g in sodium chloride 0.9 % 100 mL IVPB, 2 g, Intravenous, Q4H, Arbie Cookey, MD, Stopped at 02/27/18 [phone] .  bisacodyl (DULCOLAX) suppository 10 mg, 10 mg, Rectal, Daily PRN, Tukov-Yual, Magdalene S, NP, 10 mg at 02/25/18 0909 .  budesonide (PULMICORT) nebulizer solution 0.25 mg, 0.25 mg, Nebulization, BID, Kasa, Kurian, MD, 0.25 mg at 02/27/18 0732 .  famotidine (PEPCID) tablet 20 mg, 20 mg, Per Tube, BID, Erin Fulling, MD, 20 mg at 02/27/18 0925 .  feeding supplement (PRO-STAT SUGAR FREE 64) liquid 60 mL, 60 mL, Per Tube, 5 X Daily, Kasa, Kurian, MD, 60 mL at 02/26/18 0957 .  feeding supplement (VITAL HIGH PROTEIN) liquid 1,000 mL, 1,000 mL, Per Tube, Q24H, Kasa, Kurian, MD, 1,000 mL at 02/25/18 1545 .  heparin injection 1,000-6,000 Units, 1,000-6,000 Units, CRRT, PRN, Lateef,  Munsoor, MD, 2,800 Units at 02/20/18 0948 .  heparin injection 5,000 Units, 5,000 Units, Subcutaneous, Q8H, Conforti, John, DO, 5,000 Units at 02/27/18 0517 .  hydrochlorothiazide (MICROZIDE) capsule 12.5 mg, 12.5 mg, Oral, BID, Arbie CookeyBashir, Merlin Golden, MD, 12.5 mg at 02/27/18 1034 .  HYDROmorphone (DILAUDID) injection 0.5 mg, 0.5 mg, Intravenous, Once, Arbie CookeyBashir, Kylah Maresh, MD .  ibuprofen (ADVIL,MOTRIN) 100 MG/5ML suspension 400 mg, 400 mg, Oral, Q6H PRN, Conforti, John, DO, 400 mg at 02/23/18 0924 .  insulin aspart  (novoLOG) injection 0-20 Units, 0-20 Units, Subcutaneous, Q4H, Arbie CookeyBashir, Lunabella Badgett, MD, 11 Units at 02/27/18 [phone] .  insulin aspart (novoLOG) injection 5 Units, 5 Units, Subcutaneous, TID WC, Arbie CookeyBashir, Anaissa Macfadden, MD .  insulin detemir (LEVEMIR) injection 20 Units, 20 Units, Subcutaneous, BID, Arbie CookeyBashir, Wanza Szumski, MD, 20 Units at 02/27/18 [phone] .  ipratropium-albuterol (DUONEB) 0.5-2.5 (3) MG/3ML nebulizer solution 3 mL, 3 mL, Nebulization, QID, Kasa, Kurian, MD, 3 mL at 02/27/18 1117 .  ipratropium-albuterol (DUONEB) 0.5-2.5 (3) MG/3ML nebulizer solution 3 mL, 3 mL, Nebulization, Q4H PRN, Erin FullingKasa, Kurian, MD, 3 mL at 02/26/18 1919 .  lisinopril (PRINIVIL,ZESTRIL) tablet 20 mg, 20 mg, Oral, BID, Arbie CookeyBashir, Ulys Favia, MD, 20 mg at 02/27/18 0935 .  MEDLINE mouth rinse, 15 mL, Mouth Rinse, BID, Arbie CookeyBashir, Kahleel Fadeley, MD .  metroNIDAZOLE (FLAGYL) tablet 500 mg, 500 mg, Oral, Q8H, Tukov-Yual, Magdalene S, NP, 500 mg at 02/27/18 [phone] .  multivitamin with minerals tablet 1 tablet, 1 tablet, Oral, Daily, Arbie CookeyBashir, Ivionna Verley, MD, 1 tablet at 02/27/18 0935 .  oxyCODONE (Oxy IR/ROXICODONE) immediate release tablet 20 mg, 20 mg, Per Tube, Q4H PRN, Arbie CookeyBashir, Hila Bolding, MD, 20 mg at 02/27/18 1001 .  QUEtiapine (SEROQUEL) tablet 50 mg, 50 mg, Per Tube, QHS, Kasa, Kurian, MD, 50 mg at 02/26/18 2252         ASSESSMENT AND PLAN SYNOPSIS  47 yo male with s resp failure from pneumonia complicated by acute renal failure on s/p extubation and doing well   Hypoxic and Hypercapnic Respiratory Failure  -continue Bronchodilator Therapy BIPAP/o2 as tolerated. On 6 lo2  Renal Failure-resolved Follow up chem 7 Follow UOP  Diarrhea , C diff negative, stop flagyl  NEUROLOGY-awake and alert and appropiate  SEVERE COPD EXACERBATION -cont inhaled  steroids  -continue NEB THERAPY as prescribed -wean fio2 as needed and tolerated   CARDIACCARDIAC FAILURE-GRADE 1 diastolic dysfunction EF 60% -oxygen as needed   Chronic back  pain/compression fractures on narcotics  ID E faecalis from the catheter tip , cont ampicillin --follow up repeat blood cultures. Low grade fever  GI/Nutrition Passes swallow eval on diet  DM   LA Insulin up titrated. Follow blood sugars with SSI  HTN started on HCTZ and lisinopril -  ELECTROLYTES -follow labs as needed -replace as needed     Overall, patient is critically ill, prognosis is guarded.  Patient with Multiorgan failure and at high risk for cardiac arrest and death.   Arbie CookeyKhalid Geramy Lamorte, MD  02/27/2018 12:25 PM [facility]

## 2018-02-27 NOTE — Plan of Care (Signed)
Patient is alert and responsive. On oxygen 6L nasal canula. Patient refusing to wear BiPap.  Educated on the importance of being compliant with orders.  Veteran still refused. O2 sats above 90% most of shift.  No respiratory distress noted.  Able to eat ice chips and drink water without difficulty.  Tolerated PO medications with applesauce.  PO meds given orally per NP  No adverse effects.  Will continue to monitor.

## 2018-02-27 NOTE — Progress Notes (Signed)
[facility] [place] [facility]   PATIENT NAME: Dorothey BasemanJames Champlain    MR#:  [id number]  DATE OF BIRTH:  04/01/1971  SUBJECTIVE:  CHIEF COMPLAINT:   Chief Complaint  Patient presents with  . Weakness    Brought with altered mental status and noted to have respiratory failure and intubated.  Septic shock with multiorgan failure and required dialysis initially.  Blood pressure stable. Renal function improved, have good urine output.  Failed SBT.  Had some fever , HD catheter removed.  Finally extubated 02/26/18.  Generalized weakness.  REVIEW OF SYSTEMS:     Review of Systems  Constitutional: Negative for fever, malaise/fatigue and weight loss.  HENT: Negative for congestion, ear discharge and hearing loss.   Eyes: Negative for blurred vision, photophobia and discharge.  Respiratory: Negative for cough, sputum production and shortness of breath.   Cardiovascular: Negative for chest pain and leg swelling.  Gastrointestinal: Negative for abdominal pain, heartburn, nausea and vomiting.  Genitourinary: Negative for dysuria and frequency.  Musculoskeletal: Negative for back pain and myalgias.  Skin: Negative for rash.  Neurological: Negative for dizziness, tremors, focal weakness and seizures.  Psychiatric/Behavioral: Negative for depression and hallucinations.    DRUG ALLERGIES:   Allergies  Allergen Reactions  . Morphine And Related Other (See Comments)    headache    VITALS:  Blood pressure (!) 151/78, pulse 90, temperature 98.2 F (36.8 C), temperature source Oral, resp. rate 20, height (S) 6\' 4"  (1.93 m), weight 133.7 kg, SpO2 98 %.  PHYSICAL EXAMINATION:  GENERAL:  47 y.o.-year-old patient lying in the bed with no acute distress.  EYES: Pupils equal, round, reactive to light and accommodation. No scleral icterus. Extraocular muscles intact.  HEENT: Head atraumatic, normocephalic. Oropharynx and nasopharynx clear. Extubated. NECK:  Supple, no jugular  venous distention. No thyroid enlargement, no tenderness.  LUNGS: Normal breath sounds bilaterally, no wheezing, rales,rhonchi or crepitation. No use of accessory muscles of respiration.  CARDIOVASCULAR: S1, S2 normal. No murmurs, rubs, or gallops.  ABDOMEN: Soft, nontender, nondistended. Bowel sounds present. No organomegaly or mass.  EXTREMITIES: No pedal edema, cyanosis, or clubbing.  NEUROLOGIC: Patient is alert and oriented now.  Moves all 4 limbs, have generalized weakness, sensations intact.Marland Kitchen. PSYCHIATRIC: Oriented x3.  SKIN: No obvious rash, lesion, or ulcer.   Physical Exam LABORATORY PANEL:   CBC Recent Labs  Lab 02/27/18 0543  WBC 11.8*  HGB 12.2*  HCT 36.2*  PLT 165   ------------------------------------------------------------------------------------------------------------------  Chemistries  Recent Labs  Lab 02/22/18 0419 02/23/18 0414  02/27/18 0543  NA 142 138   < > 142  K 5.3* 6.3*   < > 3.7  CL 105 102   < > 100  CO2 32 27   < > 32  GLUCOSE 181* 363*   < > 293*  BUN 43* 46*   < > 43*  CREATININE 1.03 0.91   < > 0.79  CALCIUM 8.9 9.1   < > 9.7  MG  --  2.0  --   --   AST 29  --   --   --   ALT 19  --   --   --   ALKPHOS 58  --   --   --   BILITOT 0.7  --   --   --    < > = values in this interval not displayed.   ------------------------------------------------------------------------------------------------------------------  Cardiac Enzymes No results for input(s): TROPONINI in the last 168 hours. ------------------------------------------------------------------------------------------------------------------  RADIOLOGY:  Dg Chest Port 1 View  Result Date: 02/27/2018 CLINICAL DATA:  Respiratory failure. EXAM: PORTABLE CHEST 1 VIEW COMPARISON:  02/25/2018 FINDINGS: Endotracheal tube and NG tube have been removed. PICC tip remains at the cavoatrial junction. Heart size and vascularity are normal. Clearing of the minimal right base atelectasis.  Minimal residual left base atelectasis. No effusions. No acute bone abnormality. IMPRESSION: 1. Clearing of the atelectasis at the right base. 2. Minimal residual atelectasis at the left base. Electronically Signed   By: Francene Boyers M.D.   On: 02/27/2018 10:33    ASSESSMENT AND PLAN:   Active Problems:   Respiratory failure (HCC)  47 year old male with history of hypertension, chronic back pain on chronic narcotics with back stimulator presents to the emergency room with 2 days of generalized weakness and unresponsiveness.  1.  Sepsis with acute hypoxic respiratory failure in the setting of pneumonia: Patient presents with tachypnea, leukocytosis and tachycardia S/p intubation- extubated 02/26/18 Management as per intencivist.   May need Bipap at night.  2.  Pneumonia:     New fever 02/23/18- HD catheter removed and sent for culture-Enterococcus faecalis Cefepime and vancomycin-stopped. Follow-up on blood cultures- negative so far. Started on ampicillin will give for total 7 days.  3.  Hyperkalemia: This was treated in the emergency room.  This is due to acute kidney injury. CRRT with help of nephrology.  K rising again, suggest to manage with lasix. Came down.   4.  Acute kidney injury in the setting of poor p.o. intake and sepsis Nephrology consultation Dialysis done. Now nephrologist is just monitoring renal function, it is improving and patient had good urine output.  5.  Hyponatremia from dehydration- improved.  6.  Elevated troponin: This is likely due to sepsis  echocardiogram  7.  Diabetes: Monitor as per ICU protocol  Physical deconditioning and generalized weakness, due to prolonged stay in ICU, encouraged to have physical therapy.  All the records are reviewed and case discussed with Care Management/Social Workerr. Management plans discussed with the patient, family and they are in agreement.  CODE STATUS: full.  TOTAL TIME TAKING CARE OF THIS PATIENT:  35 minutes.    POSSIBLE D/C IN 2-3?? DAYS, DEPENDING ON CLINICAL CONDITION.   Altamese Dilling M.D on 02/27/2018   Between 7am to 6pm - Pager - [phone]  After 6pm go to [email or web address] - Social research officer, government  [facility] [place] [facility]  Office  [phone]  CC: Primary care physician; Lauro Regulus, MD  Note: This dictation was prepared with Dragon dictation along with smaller phrase technology. Any transcriptional errors that result from this process are unintentional.

## 2018-02-27 NOTE — Evaluation (Addendum)
Clinical/Bedside Swallow Evaluation Patient Details  Name: Martin BonnetJames Calvin Riddle MRN: [id number] Date of Birth: 03/29/1971  Today's Date: 02/27/2018 Time: SLP Start Time (ACUTE ONLY): 0830 SLP Stop Time (ACUTE ONLY): 0930 SLP Time Calculation (min) (ACUTE ONLY): 60 min  Past Medical History:  Past Medical History:  Diagnosis Date  . Diabetes mellitus without complication (HCC)   . Hypercholesteremia   . Hypertension   . Pancreatitis    Past Surgical History:  Past Surgical History:  Procedure Laterality Date  . ANKLE SURGERY Left 2001,2003  . BACK SURGERY  2003,2012   HPI:   Pt is a 47 y/o male here with y.o. male with a known history of Diabetes, HTN and chronic back pain with a spinal stimulator implanted on chronic narcotics who presents to the emergency room due to decreased responsiveness and generalized weakness.  The week prior to admission patient  had a upper respiratory infection, ultimately he needed to be intubated 9/13 and was extubated AM of 9/20.  Assessment / Plan / Recommendation Clinical Impression  Pt appears to present w/ adequate oropharyngeal phase swallowing function w/ reduced risk for aspiration when following general aspiration precautions. Pt does have some Confusion ongoing; suspect could be related to extended hospital stay and oral intubation/extubation. W/ few verbal cues, pt was able to redirect to tasks and feed himself. Pt quite verbal but easily distracted. Pt consumed po trials of thin liquids and purees/solids w/ no immediate, overt s/s of aspiration noted; no decline in vocal quality or respiratory status. O2 sats and HR/RR remained at their baselines during/post oral intake(o2 sats 9&-98%). [facility] for bolus management and oral clearing. Pt does have native dentition. Pt fed self w/ setup assistance but noted min impulsivity. Recommend a mech soft diet w/ thin liquids; general aspiration precautions; Pills in Puree for safer, easier swallowing  at this time. He would benefit from Supervision during meals. Pt may benefit from further Cognitive assessment post discharge if Cognitive status remains declined; pt distracted and less focused for follow through w/ tasks. NSG updated.  SLP Visit Diagnosis: Dysphagia, oral phase (R13.11)(impacted by his Cognitive status)    Aspiration Risk  (reduced following general precautions)    Diet Recommendation     Medication Administration: Whole meds with puree(Crushed in puree as needed d/t Cognition for safety)    Other  Recommendations Recommended Consults: (Dietician f/u) Oral Care Recommendations: Oral care BID;Staff/trained caregiver to provide oral care Other Recommendations: (n/a)   Follow up Recommendations None      Frequency and Duration min 1 x/week  1 week       Prognosis Prognosis for Safe Diet Advancement: Good Barriers to Reach Goals: Cognitive deficits      Swallow Study   General Date of Onset: 02/19/18 Type of Study: Bedside Swallow Evaluation Previous Swallow Assessment: none reported Diet Prior to this Study: NPO(regular foods at home per pt) Temperature Spikes Noted: Yes(wbc 11.8; temp 100.4) Respiratory Status: Nasal cannula(6 liters) History of Recent Intubation: Yes Length of Intubations (days): 7 days Date extubated: 02/26/18 Behavior/Cognition: Alert;Cooperative;Pleasant mood;Confused;Distractible;Requires cueing Oral Cavity Assessment: Within Functional Limits Oral Care Completed by SLP: Recent completion by staff Oral Cavity - Dentition: Adequate natural dentition Vision: Functional for self-feeding Self-Feeding Abilities: Able to feed self;Needs assist;Needs set up(100 Supervision) Patient Positioning: Upright in bed Baseline Vocal Quality: Normal Volitional Cough: Strong;Congested Volitional Swallow: Able to elicit    Oral/Motor/Sensory Function Overall Oral Motor/Sensory Function: Within functional limits   Ice Chips Ice chips: Within  functional limits Presentation: Spoon(fed; 3 trials)   Thin Liquid Thin Liquid: Within functional limits Presentation: Cup;Self Fed;Straw(~6 ozs total)    Nectar Thick Nectar Thick Liquid: Not tested   Honey Thick Honey Thick Liquid: Not tested   Puree Puree: Within functional limits Presentation: Self Fed;Spoon(4 ozs)   Solid     Solid: Impaired(mech soft foods) Presentation: Spoon(assisted; 5 trials) Oral Phase Impairments: (min increased oral phase time) Oral Phase Functional Implications: (min increased oral phase time) Pharyngeal Phase Impairments: (none) Other Comments: pt may be impacted by his Cognitive status       Jerilynn Som, MS, CCC-SLP Toussaint Golson 02/27/2018,11:42 AM

## 2018-02-27 NOTE — Progress Notes (Signed)
Removed undocumented rectal tube per patient comfort. Output noted in chart. Minimal output. C-Diff negative.

## 2018-02-28 LAB — CBC
HCT: 35.7 % — ABNORMAL LOW (ref 40.0–52.0)
HEMOGLOBIN: 12.1 g/dL — AB (ref 13.0–18.0)
MCH: 31.4 pg (ref 26.0–34.0)
MCHC: 34 g/dL (ref 32.0–36.0)
MCV: 92.3 fL (ref 80.0–100.0)
Platelets: 168 10*3/uL (ref 150–440)
RBC: 3.87 MIL/uL — ABNORMAL LOW (ref 4.40–5.90)
RDW: 14.1 % (ref 11.5–14.5)
WBC: 11.9 10*3/uL — ABNORMAL HIGH (ref 3.8–10.6)

## 2018-02-28 LAB — BASIC METABOLIC PANEL
Anion gap: 10 (ref 5–15)
BUN: 34 mg/dL — AB (ref 6–20)
CALCIUM: 9.1 mg/dL (ref 8.9–10.3)
CHLORIDE: 98 mmol/L (ref 98–111)
CO2: 29 mmol/L (ref 22–32)
CREATININE: 0.77 mg/dL (ref 0.61–1.24)
GFR calc Af Amer: >60 mL/min (ref >60)
GFR calc non Af Amer: >60 mL/min (ref >60)
Glucose, Bld: 257 mg/dL — ABNORMAL HIGH (ref 70–99)
Potassium: 3.1 mmol/L — ABNORMAL LOW (ref 3.5–5.1)
SODIUM: 137 mmol/L (ref 135–145)

## 2018-02-28 LAB — CULTURE, BLOOD (ROUTINE X 2)
Culture: NO GROWTH
Culture: NO GROWTH
SPECIAL REQUESTS: ADEQUATE
Special Requests: ADEQUATE

## 2018-02-28 LAB — GLUCOSE, CAPILLARY
GLUCOSE-CAPILLARY: 238 mg/dL — AB (ref 70–99)
Glucose-Capillary: 234 mg/dL — ABNORMAL HIGH (ref 70–99)
Glucose-Capillary: 264 mg/dL — ABNORMAL HIGH (ref 70–99)
Glucose-Capillary: 282 mg/dL — ABNORMAL HIGH (ref 70–99)

## 2018-02-28 LAB — MAGNESIUM: Magnesium: 2 mg/dL (ref 1.7–2.4)

## 2018-02-28 MED ORDER — AMPICILLIN 500 MG PO CAPS: 500.0000 mg | ORAL_CAPSULE | Freq: Four times a day (QID) | ORAL | 0 refills | Status: AC

## 2018-02-28 MED ORDER — ONDANSETRON HCL 4 MG/2ML IJ SOLN
INTRAMUSCULAR | Status: AC
  Administered 2018-02-28: 4 mg
  Filled 2018-02-28: qty 2

## 2018-02-28 MED ORDER — IPRATROPIUM-ALBUTEROL 0.5-2.5 (3) MG/3ML IN SOLN: 3.0000 mL | Freq: Two times a day (BID) | RESPIRATORY_TRACT | Status: DC

## 2018-02-28 MED ORDER — POTASSIUM CHLORIDE CRYS ER 20 MEQ PO TBCR
20.0000 meq | EXTENDED_RELEASE_TABLET | Freq: Two times a day (BID) | ORAL | Status: DC
  Administered 2018-02-28: 20 meq via ORAL
  Filled 2018-02-28: qty 1

## 2018-02-28 MED ORDER — INSULIN LISPRO 100 UNIT/ML ~~LOC~~ SOLN: 7.0000 [IU] | Freq: Three times a day (TID) | SUBCUTANEOUS | 11 refills | Status: DC

## 2018-02-28 MED ORDER — LISINOPRIL-HYDROCHLOROTHIAZIDE 20-12.5 MG PO TABS: 2.0000 | ORAL_TABLET | Freq: Every day | ORAL | 5 refills | Status: DC

## 2018-02-28 MED ORDER — INSULIN DETEMIR 100 UNIT/ML ~~LOC~~ SOLN: 20.0000 [IU] | Freq: Two times a day (BID) | SUBCUTANEOUS | 11 refills | Status: DC

## 2018-02-28 MED ORDER — PROMETHAZINE HCL 25 MG/ML IJ SOLN: 25.0000 mg | Freq: Four times a day (QID) | INTRAMUSCULAR | Status: DC | PRN

## 2018-02-28 NOTE — Progress Notes (Signed)
Inpatient Diabetes Program Recommendations  AACE/ADA: New Consensus Statement on Inpatient Glycemic Control (2019)  Target Ranges:  Prepandial:   less than 140 mg/dL      Peak postprandial:   less than 180 mg/dL (1-2 hours)      Critically ill patients:  140 - 180 mg/dL   Results for Martin Riddle, Martin Riddle (MRN [id number]) as of 02/28/2018 07:13  Ref. Range 02/27/2018 07:39 02/27/2018 11:57 02/27/2018 14:20 02/27/2018 16:43 02/27/2018 19:56 02/27/2018 21:10 02/28/2018 00:14 02/28/2018 03:45  Glucose-Capillary Latest Ref Range: 70 - 99 mg/dL 409283 (H) 811328 (H) 914292 (H) 276 (H) 317 (H) 354 (H) 264 (H) 282 (H)    Review of Glycemic Control  Diabetes history: DM2 Outpatient Diabetes medications: NPH 35 units BID, Regular 18 units TID with meals, Glipizide XL 10 mg daily, Metformin 1000 mg BID Current orders for Inpatient glycemic control: Levemir 20 units BID, Novolog 0-20 units Q4H, Novolog 5 units TID with meals  Inpatient Recommendations: Insulin-Basal: Please consider increasing Levemir to 30 units BID. Insulin-Meal Coverage: Please consider increasing meal coverage to Novolog 15 units TID with meals if patient eats at least 50% of meals.  Thanks, Orlando PennerMarie Jemya Depierro, RN, MSN, CDE Diabetes Coordinator Inpatient Diabetes Program [phone] (Team Pager from 8am to 5pm)

## 2018-02-28 NOTE — Discharge Instructions (Signed)
Need sleep study for C PAP.

## 2018-02-28 NOTE — Plan of Care (Signed)
  Problem: Education: Goal: Knowledge of General Education information will improve Description: Including pain rating scale, medication(s)/side effects and non-pharmacologic comfort measures Outcome: Progressing   Problem: Health Behavior/Discharge Planning: Goal: Ability to manage health-related needs will improve Outcome: Progressing   Problem: Clinical Measurements: Goal: Ability to maintain clinical measurements within normal limits will improve Outcome: Progressing Goal: Will remain free from infection Outcome: Progressing Goal: Diagnostic test results will improve Outcome: Progressing Goal: Respiratory complications will improve Outcome: Progressing Goal: Cardiovascular complication will be avoided Outcome: Progressing   Problem: Activity: Goal: Risk for activity intolerance will decrease Outcome: Progressing   Problem: Nutrition: Goal: Adequate nutrition will be maintained Outcome: Progressing   Problem: Coping: Goal: Level of anxiety will decrease Outcome: Progressing   Problem: Elimination: Goal: Will not experience complications related to bowel motility Outcome: Progressing Goal: Will not experience complications related to urinary retention Outcome: Progressing   Problem: Pain Managment: Goal: General experience of comfort will improve Outcome: Progressing   Problem: Safety: Goal: Ability to remain free from injury will improve Outcome: Progressing   Problem: Skin Integrity: Goal: Risk for impaired skin integrity will decrease Outcome: Progressing   Problem: Clinical Measurements: Goal: Ability to maintain a body temperature in the normal range will improve Outcome: Progressing   Problem: Respiratory: Goal: Ability to maintain adequate ventilation will improve Outcome: Progressing Goal: Ability to maintain a clear airway will improve Outcome: Progressing   

## 2018-02-28 NOTE — Plan of Care (Signed)
Patient is weak encourage ambulation in the room and hallway. Educate when spouse/caregiver is available. Patient's comprehension of disease process is challenged.

## 2018-02-28 NOTE — Care Management (Signed)
Informed by attending patient is to discharge home today.  Patient and his wife decline the need for skilled nursing facility placement and also decline the need for home health.  Does have a walker at home.  He will be scheduled for outpatient sleep study.  Is on room air.  Attending aware patient declines home health services and does not feel that patient needs it either.

## 2018-03-03 LAB — CULTURE, BLOOD (SINGLE)
CULTURE: NO GROWTH
SPECIAL REQUESTS: ADEQUATE

## 2018-03-03 NOTE — Discharge Summary (Signed)
[facility] - [place] [facility]   PATIENT NAME: Martin Riddle    MR#:  [id number]  DATE OF BIRTH:  1971/04/30  DATE OF ADMISSION:  02/19/2018 ADMITTING PHYSICIAN: Adrian Saran, MD  DATE OF DISCHARGE: 02/28/2018  4:37 PM  PRIMARY CARE PHYSICIAN: Lauro Regulus, MD    ADMISSION DIAGNOSIS:  Hyperkalemia [E87.5] Hypoxia [R09.02] Generalized weakness [R53.1] Acute renal failure, unspecified acute renal failure type (HCC) [N17.9] Community acquired pneumonia of left upper lobe of lung (HCC) [J18.1]  DISCHARGE DIAGNOSIS:  Active Problems:   Respiratory failure (HCC)   SECONDARY DIAGNOSIS:   Past Medical History:  Diagnosis Date  . Diabetes mellitus without complication (HCC)   . Hypercholesteremia   . Hypertension   . Pancreatitis     HOSPITAL COURSE:   47 year old male with history of hypertension, chronic back pain on chronic narcotics with back stimulator presents to the emergency room with 2 days of generalized weakness and unresponsiveness.  1. Sepsis with acute hypoxic respiratory failure in the setting of pneumonia: Patient presents with tachypnea, leukocytosis and tachycardia S/p intubation- extubated 02/26/18 Management as per intencivist.   May need Bipap at night.  Advised to follow with PMD for sleep study.  2. Pneumonia:     New fever 02/23/18- HD catheter removed and sent for culture-Enterococcus faecalis Cefepime and vancomycin-stopped. Follow-up on blood cultures- negative so far. Started on ampicillin will give for total 7 days.  3. Hyperkalemia: This was treated in the emergency room. This is due to acute kidney injury. CRRT with help of nephrology.  K rising again, suggest to manage with lasix. Came down.   4. Acute kidney injury in the setting of poor p.o. intake and sepsis Nephrology consultation Dialysis done. Now nephrologist is just monitoring renal function, it is improving and patient had good urine  output.  5. Hyponatremia from dehydration- improved.  6. Elevated troponin: This is likely due to sepsis  echocardiogram  7. Diabetes: Monitor as per ICU protocol  He had weakness in beginning, but much improved in next 2 days and went home.  DISCHARGE CONDITIONS:   Stable.  CONSULTS OBTAINED:    DRUG ALLERGIES:   Allergies  Allergen Reactions  . Morphine And Related Other (See Comments)    headache    DISCHARGE MEDICATIONS:   Allergies as of 02/28/2018      Reactions   Morphine And Related Other (See Comments)   headache      Medication List    STOP taking these medications   aspirin 81 MG EC tablet   glipiZIDE 10 MG 24 hr tablet Commonly known as:  GLUCOTROL XL   insulin aspart 100 UNIT/ML injection Commonly known as:  novoLOG Replaced by:  insulin lispro 100 UNIT/ML injection   insulin NPH Human 100 UNIT/ML injection Commonly known as:  HUMULIN N,NOVOLIN N   insulin regular 100 units/mL injection Commonly known as:  NOVOLIN R,HUMULIN R   metFORMIN 1000 MG tablet Commonly known as:  GLUCOPHAGE   traZODone 150 MG tablet Commonly known as:  DESYREL     TAKE these medications   ampicillin 500 MG capsule Commonly known as:  PRINCIPEN Take 1 capsule (500 mg total) by mouth 4 (four) times daily for 5 days.   atorvastatin 20 MG tablet Commonly known as:  LIPITOR Take 20 mg by mouth daily.   cetirizine 10 MG tablet Commonly known as:  ZYRTEC Take 10 mg by mouth daily.   DULoxetine 60 MG capsule Commonly known as:  CYMBALTA Take 60 mg by mouth 2 (two) times daily. What changed:  Another medication with the same name was removed. Continue taking this medication, and follow the directions you see here.   gemfibrozil 600 MG tablet Commonly known as:  LOPID Take 1 tablet (600 mg total) by mouth 2 (two) times daily before a meal.   insulin detemir 100 UNIT/ML injection Commonly known as:  LEVEMIR Inject 0.2 mLs (20 Units total) into the skin  2 (two) times daily. What changed:  how much to take   insulin lispro 100 UNIT/ML injection Commonly known as:  HUMALOG Inject 0.07 mLs (7 Units total) into the skin 3 (three) times daily with meals. Replaces:  insulin aspart 100 UNIT/ML injection   lisinopril-hydrochlorothiazide 20-12.5 MG tablet Commonly known as:  PRINZIDE,ZESTORETIC Take 2 tablets by mouth daily.   LORazepam 2 MG tablet Commonly known as:  ATIVAN Take 2 mg by mouth 2 (two) times daily.   methocarbamol 750 MG tablet Commonly known as:  ROBAXIN Take 1 tablet (750 mg total) by mouth every 6 (six) hours as needed for muscle spasms.   omeprazole 20 MG capsule Commonly known as:  PRILOSEC Take 20 mg by mouth daily.   oxycodone 30 MG immediate release tablet Commonly known as:  ROXICODONE Take 30 mg by mouth every 4 (four) hours as needed for pain. What changed:  Another medication with the same name was removed. Continue taking this medication, and follow the directions you see here.   polyethylene glycol packet Commonly known as:  MIRALAX / GLYCOLAX Take 17 g by mouth daily.        DISCHARGE INSTRUCTIONS:    Follow with PMD in 1-2 weeks.  If you experience worsening of your admission symptoms, develop shortness of breath, life threatening emergency, suicidal or homicidal thoughts you must seek medical attention immediately by calling 911 or calling your MD immediately  if symptoms less severe.  You Must read complete instructions/literature along with all the possible adverse reactions/side effects for all the Medicines you take and that have been prescribed to you. Take any new Medicines after you have completely understood and accept all the possible adverse reactions/side effects.   Please note  You were cared for by a hospitalist during your hospital stay. If you have any questions about your discharge medications or the care you received while you were in the hospital after you are discharged, you  can call the unit and asked to speak with the hospitalist on call if the hospitalist that took care of you is not available. Once you are discharged, your primary care physician will handle any further medical issues. Please note that NO REFILLS for any discharge medications will be authorized once you are discharged, as it is imperative that you return to your primary care physician (or establish a relationship with a primary care physician if you do not have one) for your aftercare needs so that they can reassess your need for medications and monitor your lab values.    Today   CHIEF COMPLAINT:   Chief Complaint  Patient presents with  . Weakness    HISTORY OF PRESENT ILLNESS:  Martin Riddle  is a 47 y.o. male with a known history of Diabetes, HTN and chronic back pain with a spinal stimulator implanted on chronic narcotics who presents to the emergency room due to decreased responsiveness and generalized weakness.  Over the past week patient has had a upper respiratory infection however the wife reports this had  improved up until 2 days ago when patient was feeling more weak and fatigued.  She denies fever chills.  Patient is currently unresponsive and respiratory rate is up to 40.  I have asked the ER physician to intubate the patient.  Wife is in agreement.  I have also spoken with intensivist.  Patient arrived to the ER hypoxic with O2 saturations 80s on 6 L he is currently on nonrebreather.  He does not wear oxygen at home. He has been somnolent.  He was given broad-spectrum antibiotics for chest x-ray showing pneumonia.  He is found to have acute kidney injury with a creatinine of 4 and hyperkalemia.  This is been treated by the ER physician.   VITAL SIGNS:  Blood pressure 125/61, pulse 79, temperature 98.3 F (36.8 C), temperature source Oral, resp. rate 18, height (S) 6\' 4"  (1.93 m), weight 132.9 kg, SpO2 94 %.  I/O:  No intake or output data in the 24 hours ending 03/03/18  0739  PHYSICAL EXAMINATION:  GENERAL:  47 y.o.-year-old patient lying in the bed with no acute distress.  EYES: Pupils equal, round, reactive to light and accommodation. No scleral icterus. Extraocular muscles intact.  HEENT: Head atraumatic, normocephalic. Oropharynx and nasopharynx clear.  NECK:  Supple, no jugular venous distention. No thyroid enlargement, no tenderness.  LUNGS: Normal breath sounds bilaterally, no wheezing, rales,rhonchi or crepitation. No use of accessory muscles of respiration.  CARDIOVASCULAR: S1, S2 normal. No murmurs, rubs, or gallops.  ABDOMEN: Soft, non-tender, non-distended. Bowel sounds present. No organomegaly or mass.  EXTREMITIES: No pedal edema, cyanosis, or clubbing.  NEUROLOGIC: Cranial nerves II through XII are intact. Muscle strength 5/5 in all extremities. Sensation intact. Gait not checked.  PSYCHIATRIC: The patient is alert and oriented x 3.  SKIN: No obvious rash, lesion, or ulcer.   DATA REVIEW:   CBC Recent Labs  Lab 02/28/18 0429  WBC 11.9*  HGB 12.1*  HCT 35.7*  PLT 168    Chemistries  Recent Labs  Lab 02/28/18 0429  NA 137  K 3.1*  CL 98  CO2 29  GLUCOSE 257*  BUN 34*  CREATININE 0.77  CALCIUM 9.1  MG 2.0    Cardiac Enzymes No results for input(s): TROPONINI in the last 168 hours.  Microbiology Results  Results for orders placed or performed during the hospital encounter of 02/19/18  Blood culture (routine x 2)     Status: None   Collection Time: 02/19/18 11:02 AM  Result Value Ref Range Status   Specimen Description BLOOD LEFT ANTECUBITAL  Final   Special Requests   Final    BOTTLES DRAWN AEROBIC AND ANAEROBIC Blood Culture results may not be optimal due to an inadequate volume of blood received in culture bottles   Culture   Final    NO GROWTH 5 DAYS Performed at [facility] [place] [facility], [address], [place], [place] [address]    Report Status 02/24/2018 FINAL  Final  Urine culture     Status: None    Collection Time: 02/19/18 11:36 AM  Result Value Ref Range Status   Specimen Description   Final    URINE, CLEAN CATCH Performed at [facility], [address] [place], [place] [address]    Special Requests   Final    NONE Performed at [facility], [address] [place], [place] [address]    Culture   Final    NO GROWTH Performed at [facility], [address],  [place], [place] [address]    Report Status 02/20/2018 FINAL  Final  Blood culture (routine x 2)     Status: None   Collection Time: 02/19/18 11:55 AM  Result Value Ref Range Status   Specimen Description BLOOD RIGHT ANTECUBITAL  Final   Special Requests   Final    BOTTLES DRAWN AEROBIC AND ANAEROBIC Blood Culture adequate volume   Culture   Final    NO GROWTH 5 DAYS Performed at [facility], [address] [place], [place] [address]    Report Status 02/24/2018 FINAL  Final  MRSA PCR Screening     Status: None   Collection Time: 02/19/18  2:31 PM  Result Value Ref Range Status   MRSA by PCR NEGATIVE NEGATIVE Final    Comment:        The GeneXpert MRSA Assay (FDA approved for NASAL specimens only), is one component of a comprehensive MRSA colonization surveillance program. It is not intended to diagnose MRSA infection nor to guide or monitor treatment for MRSA infections. Performed at [facility], [address] [place], [place] [address]   Culture, respiratory     Status: None   Collection Time: 02/22/18  6:00 AM  Result Value Ref Range Status   Specimen Description   Final    TRACHEAL ASPIRATE Performed at [facility], [address], [place], [place] [address]    Special Requests   Final    NONE Performed at [facility], [address] [place], [place] [address]    Gram Stain   Final    NO WBC SEEN RARE GRAM VARIABLE ROD RARE GRAM POSITIVE COCCI IN PAIRS    Culture   Final    MODERATE Consistent with normal respiratory  flora. Performed at [facility], [address], [place], [place] [address]    Report Status 02/24/2018 FINAL  Final  CULTURE, BLOOD (ROUTINE X 2) w Reflex to ID Panel     Status: None   Collection Time: 02/23/18  4:37 AM  Result Value Ref Range Status   Specimen Description BLOOD LEFT WRIST  Final   Special Requests   Final    BOTTLES DRAWN AEROBIC AND ANAEROBIC Blood Culture adequate volume   Culture   Final    NO GROWTH 5 DAYS Performed at [facility], [address] [place], [place] [address]    Report Status 02/28/2018 FINAL  Final  CULTURE, BLOOD (ROUTINE X 2) w Reflex to ID Panel     Status: None   Collection Time: 02/23/18  4:48 AM  Result Value Ref Range Status   Specimen Description BLOOD LEFT HAND  Final   Special Requests   Final    BOTTLES DRAWN AEROBIC AND ANAEROBIC Blood Culture adequate volume   Culture   Final    NO GROWTH 5 DAYS Performed at [facility], [address] [place], [place] [address]    Report Status 02/28/2018 FINAL  Final  Urine Culture     Status: None   Collection Time: 02/23/18  4:51 AM  Result Value Ref Range Status   Specimen Description   Final    URINE, RANDOM Performed at [facility], [address], [place], [place] [address]    Special Requests   Final    NONE Performed at [facility], [address], [place], [place] [address]    Culture   Final    NO GROWTH Performed at [facility], [address] [place], [place] [address]    Report Status 02/24/2018 FINAL  Final  Cath Tip Culture     Status: Abnormal   Collection Time: 02/23/18  9:45 AM  Result Value Ref Range Status   Specimen Description   Final    CATH TIP Performed at [facility], [address] [place], [place] [address]    Special Requests   Final    NONE Performed at [facility], [address] [place], [place] [address]    Culture 1,000 COLONIES/mL ENTEROCOCCUS FAECALIS (A)   Final   Report Status 02/26/2018 FINAL  Final   Organism ID, Bacteria ENTEROCOCCUS FAECALIS (A)  Final      Susceptibility   Enterococcus faecalis - MIC*    AMPICILLIN <=2 SENSITIVE Sensitive     VANCOMYCIN 1 SENSITIVE Sensitive     GENTAMICIN SYNERGY SENSITIVE Sensitive     * 1,000 COLONIES/mL ENTEROCOCCUS FAECALIS  Culture, blood (single) w Reflex to ID Panel     Status: None   Collection Time: 02/26/18  3:06 PM  Result Value Ref Range Status   Specimen Description BLOOD BLOOD LEFT HAND  Final   Special Requests   Final    BOTTLES DRAWN AEROBIC AND ANAEROBIC Blood Culture adequate volume   Culture   Final    NO GROWTH 5 DAYS Performed at [facility], [address], [place], [place] [address]    Report Status 03/03/2018 FINAL  Final  C difficile quick scan w PCR reflex     Status: None   Collection Time: 02/27/18  9:56 AM  Result Value Ref Range Status   C Diff antigen NEGATIVE NEGATIVE Final   C Diff toxin NEGATIVE NEGATIVE Final   C Diff interpretation No C. difficile detected.  Final    Comment: Performed at [facility], [address] [place], [place] [address]    RADIOLOGY:  No results found.  EKG:   Orders placed or performed during the hospital encounter of 02/19/18  . EKG 12-Lead  . EKG 12-Lead  . EKG      Management plans discussed with the patient, family and they are in agreement.  CODE STATUS:  Code Status History    Date Active Date Inactive Code Status Order ID Comments User Context   01/10/2017 1131 01/15/2017 1433 Full Code [id number]  Audery Amel, MD Inpatient   12/09/2015 1550 12/11/2015 1535 Full Code [id number]  Katharina Caper, MD Inpatient   01/27/2015 2007 01/30/2015 1345 Full Code [id number]  Katharina Caper, MD Inpatient      TOTAL TIME TAKING CARE OF THIS PATIENT: 35 minutes.    Altamese Dilling M.D on 03/03/2018 at 7:39 AM  Between 7am to 6pm - Pager - [phone]  After 6pm go to [email or web address] - Air traffic controller  [facility]  Office  [phone]  CC: Primary care physician; Lauro Regulus, MD   Note: This dictation was prepared with Dragon dictation along with smaller phrase technology. Any transcriptional errors that result from this process are unintentional.

## 2018-05-26 ENCOUNTER — Other Ambulatory Visit (INDEPENDENT_AMBULATORY_CARE_PROVIDER_SITE_OTHER): Payer: Self-pay | Admitting: Vascular Surgery

## 2018-05-26 DIAGNOSIS — E1142 Type 2 diabetes mellitus with diabetic polyneuropathy: Secondary | ICD-10-CM

## 2018-05-26 DIAGNOSIS — M79606 Pain in leg, unspecified: Secondary | ICD-10-CM

## 2018-05-27 ENCOUNTER — Encounter (INDEPENDENT_AMBULATORY_CARE_PROVIDER_SITE_OTHER): Payer: Self-pay | Admitting: Vascular Surgery

## 2018-05-27 ENCOUNTER — Encounter (INDEPENDENT_AMBULATORY_CARE_PROVIDER_SITE_OTHER): Payer: Self-pay

## 2018-05-31 ENCOUNTER — Encounter (INDEPENDENT_AMBULATORY_CARE_PROVIDER_SITE_OTHER): Payer: Medicare Other | Admitting: Nurse Practitioner

## 2018-05-31 ENCOUNTER — Encounter (INDEPENDENT_AMBULATORY_CARE_PROVIDER_SITE_OTHER): Payer: Medicare Other

## 2018-06-29 ENCOUNTER — Other Ambulatory Visit: Payer: Self-pay | Admitting: Neurosurgery

## 2018-06-29 DIAGNOSIS — M4712 Other spondylosis with myelopathy, cervical region: Secondary | ICD-10-CM

## 2018-06-30 ENCOUNTER — Telehealth: Payer: Self-pay | Admitting: Nurse Practitioner

## 2018-06-30 NOTE — Telephone Encounter (Signed)
Phone call to patient to verify medication list and allergies for myelogram procedure. Pt instructed to hold Cymbalta for 48hrs prior to myelogram appointment time. Pt verbalized understanding. 

## 2018-07-02 IMAGING — CT CT HEAD W/O CM
3 series · 15 of 47 positions shown, 18 images · non-contrast
Comparison: 01/08/2017 CT head

CLINICAL DATA: 46 y/o  M; seizure with head trauma.

EXAM:
CT HEAD WITHOUT CONTRAST
TECHNIQUE: Contiguous axial images were obtained from the base of the skull
through the vertex without intravenous contrast.

[Series 2: head wo · axial · 0.47mm/px · z∈[-147,-17]mm · 9 of 32 slices shown, 12 images]
[im 3/32  brain]
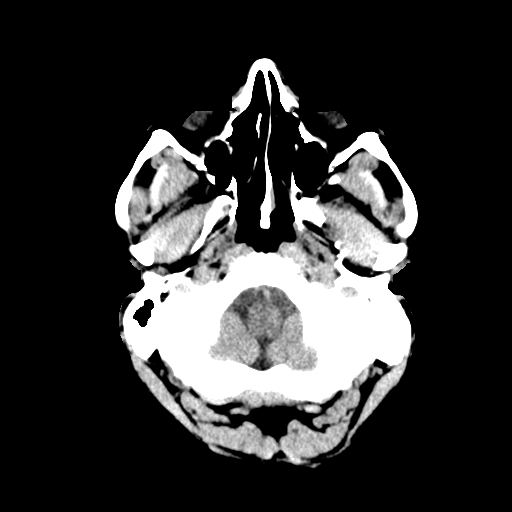
[im 3/32  bone]
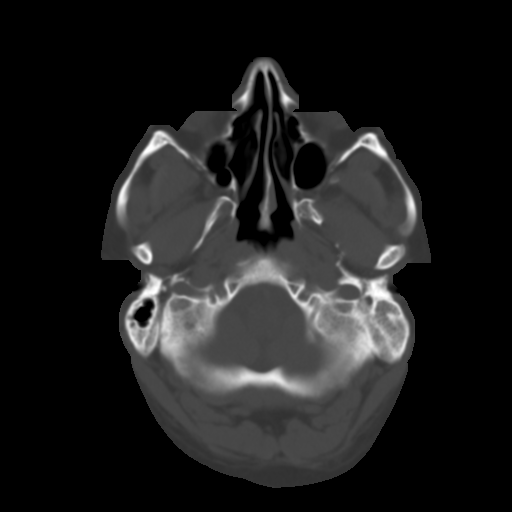
[im 6/32  brain]
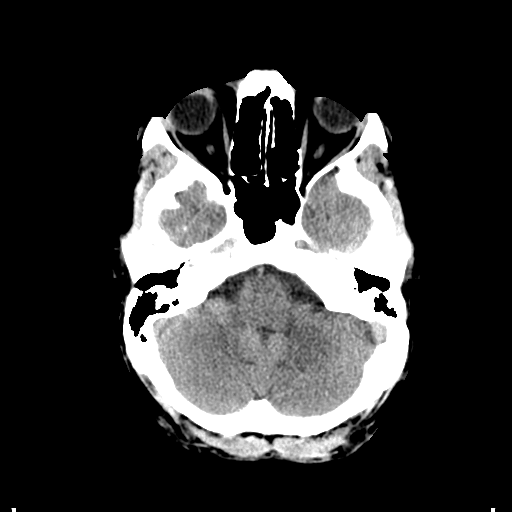
[im 9/32  brain]
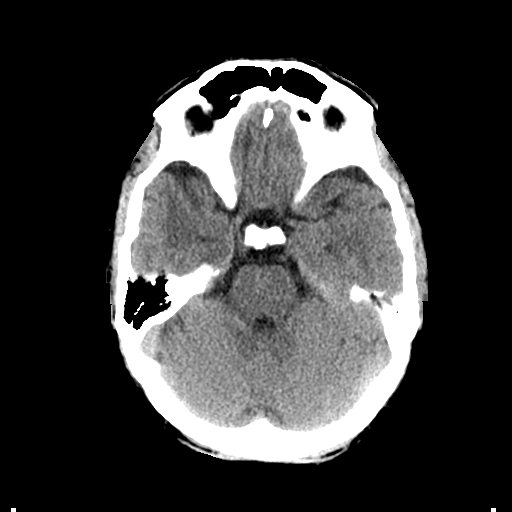
[im 12/32  brain]
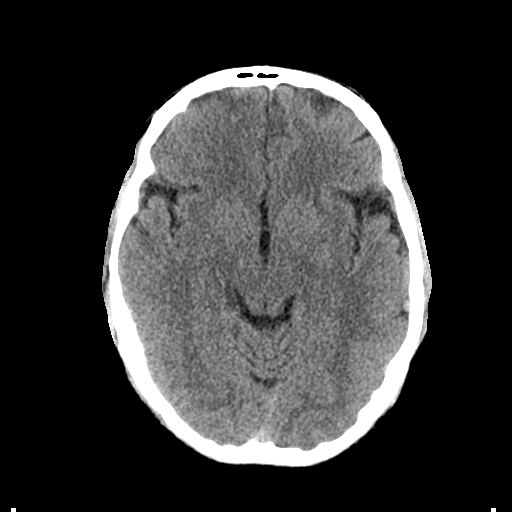
[im 17/32  brain]
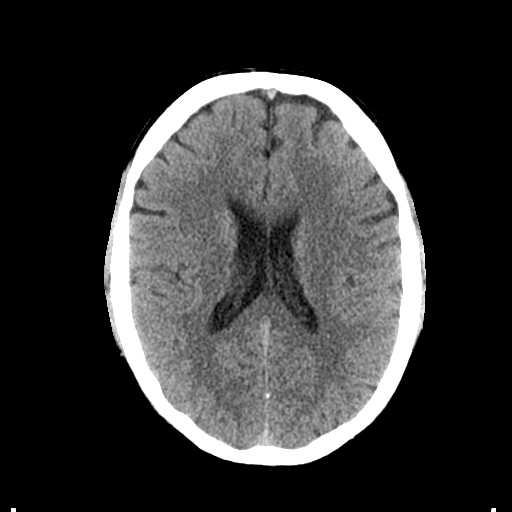
[im 17/32  bone]
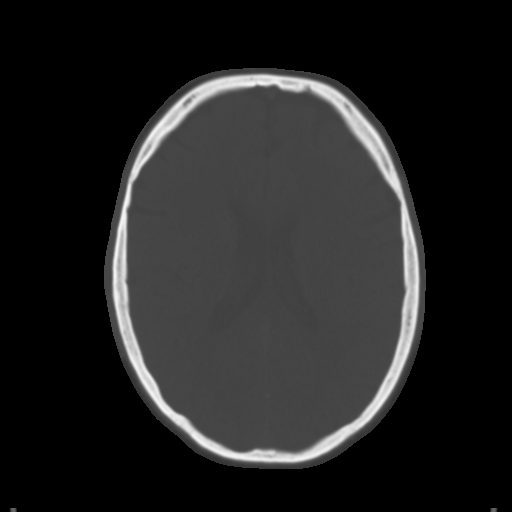
[im 20/32  brain]
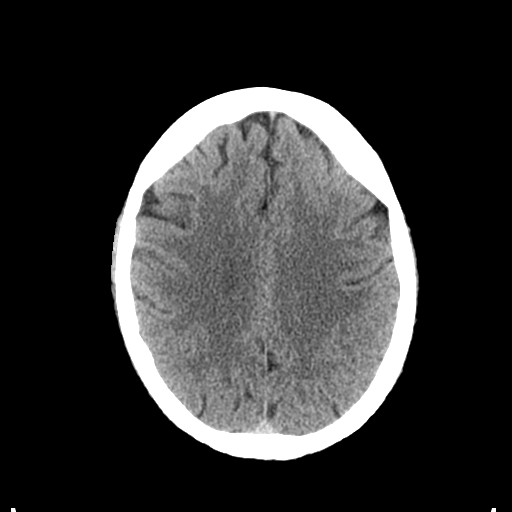
[im 23/32  brain]
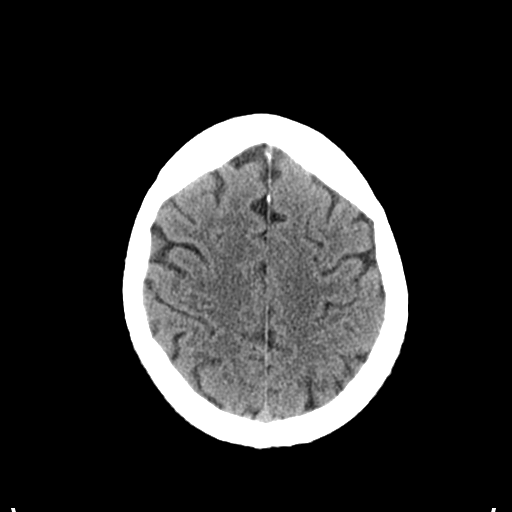
[im 26/32  brain]
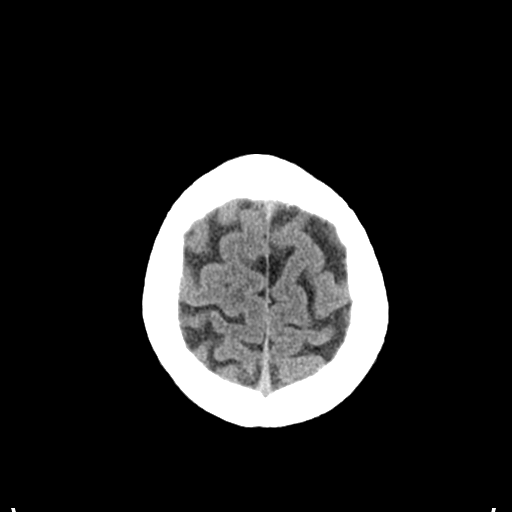
[im 29/32  brain]
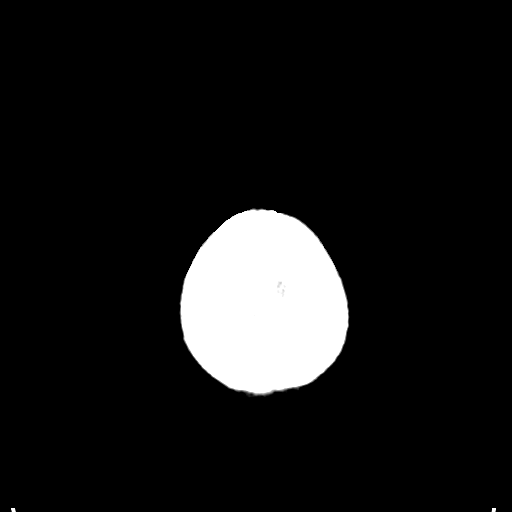
[im 29/32  bone]
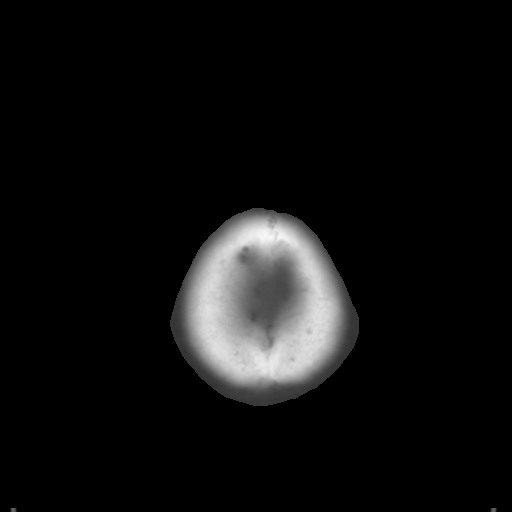

[Series 4: coronal soft tissue · coronal · 0.32mm/px · 3 of 63 slices shown]
[im 21/63  brain]
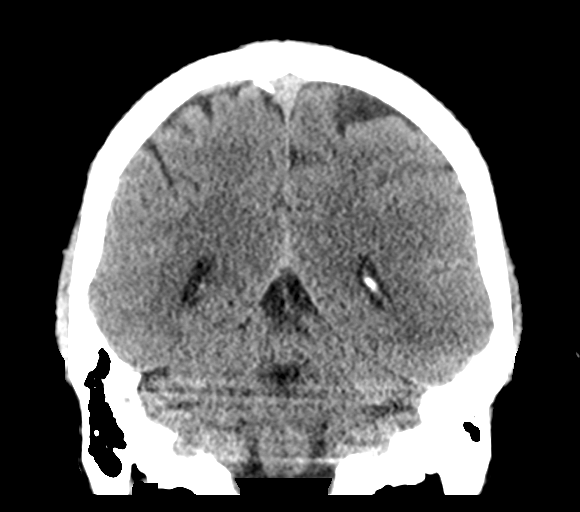
[im 28/63  brain]
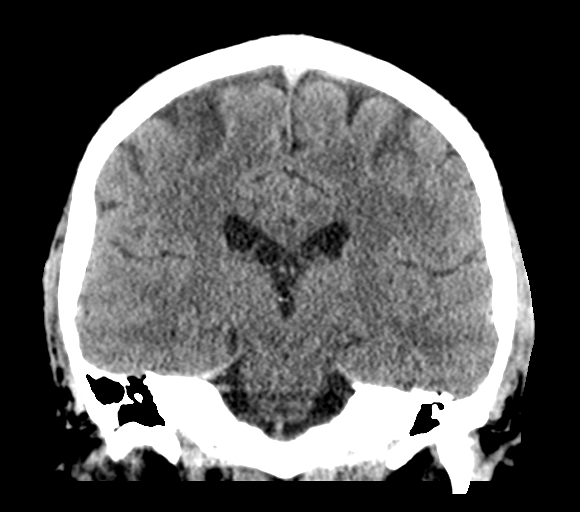
[im 35/63  brain]
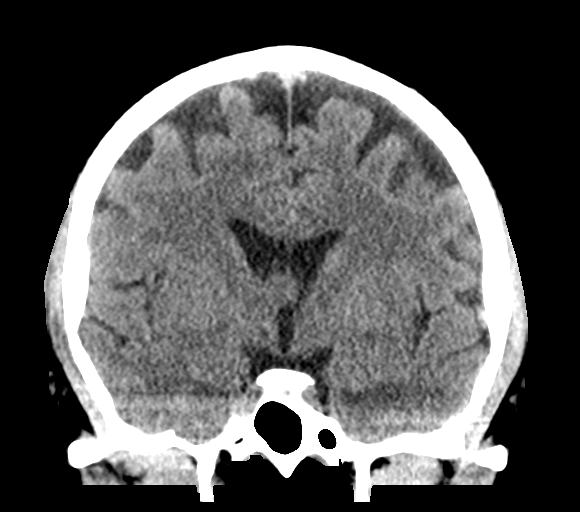

[Series 5: sagittal soft tissue · sagittal · 0.30mm/px · 3 of 55 slices shown]
[im 19/55  brain]
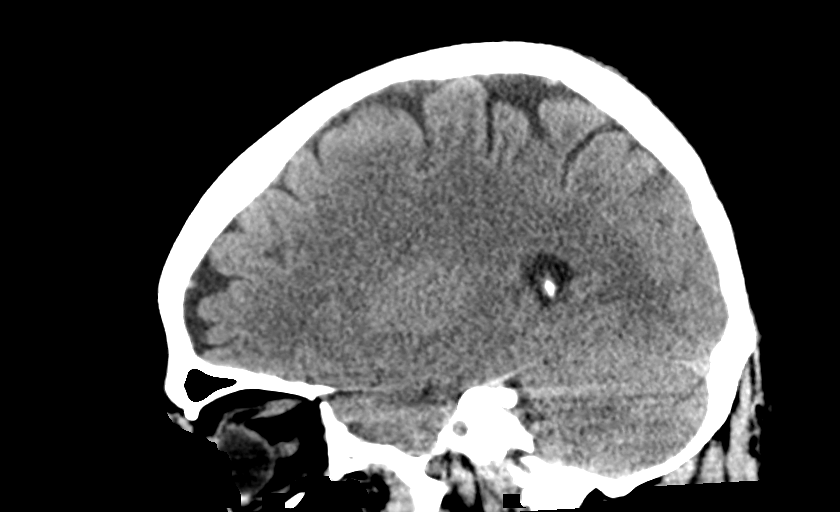
[im 28/55  brain]
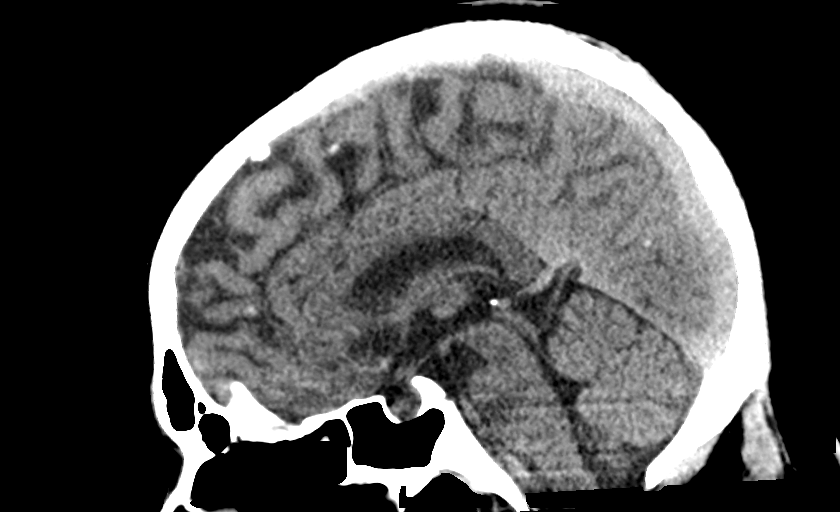
[im 37/55  brain]
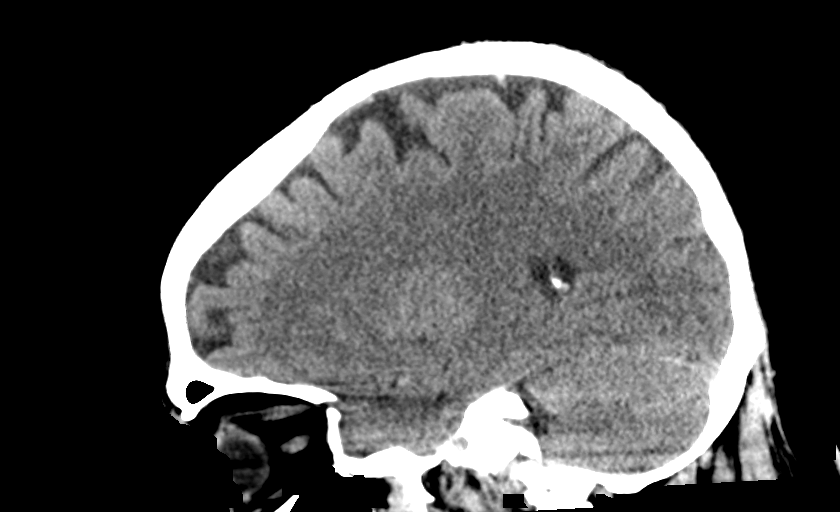

[15 of 47 positions shown; findings below may reference images not displayed]

FINDINGS: Brain: No evidence of acute infarction, hemorrhage, hydrocephalus,
extra-axial collection or mass lesion/mass effect.

Vascular: No hyperdense vessel or unexpected calcification.

Skull: Normal. Negative for fracture or focal lesion.

Sinuses/Orbits: No acute finding.

Other: None.
IMPRESSION: No acute intracranial abnormality identified. Unremarkable stable CT
of head.

By: Snack-Bar Ernoult M.D.

## 2018-07-06 NOTE — Discharge Instructions (Signed)
Myelogram Discharge Instructions  1. Go home and rest quietly for the next 24 hours.  It is important to lie flat for the next 24 hours.  Get up only to go to the restroom.  You may lie in the bed or on a couch on your back, your stomach, your left side or your right side.  You may have one pillow under your head.  You may have pillows between your knees while you are on your side or under your knees while you are on your back.  2. DO NOT drive today.  Recline the seat as far back as it will go, while still wearing your seat belt, on the way home.  3. You may get up to go to the bathroom as needed.  You may sit up for 10 minutes to eat.  You may resume your normal diet and medications unless otherwise indicated.  Drink lots of extra fluids today and tomorrow.  4. The incidence of headache, nausea, or vomiting is about 5% (one in 20 patients).  If you develop a headache, lie flat and drink plenty of fluids until the headache goes away.  Caffeinated beverages may be helpful.  If you develop severe nausea and vomiting or a headache that does not go away with flat bed rest, call [phone].  5. You may resume normal activities after your 24 hours of bed rest is over; however, do not exert yourself strongly or do any heavy lifting tomorrow. If when you get up you have a headache when standing, go back to bed and force fluids for another 24 hours.  6. Call your physician for a follow-up appointment.  The results of your myelogram will be sent directly to your physician by the following day.  7. If you have any questions or if complications develop after you arrive home, please call [phone].  Discharge instructions have been explained to the patient.  The patient, or the person responsible for the patient, fully understands these instructions.  YOU MAY RESTART YOUR CYMBALTA TOMORROW 07/08/2018 AT 09:30AM.

## 2018-07-07 ENCOUNTER — Ambulatory Visit
Admission: RE | Admit: 2018-07-07 | Discharge: 2018-07-07 | Disposition: A | Discharge status: Home / Self Care | Payer: Medicare Other | Source: Ambulatory Visit | Attending: Neurosurgery | Admitting: Neurosurgery

## 2018-07-07 DIAGNOSIS — M4712 Other spondylosis with myelopathy, cervical region: Secondary | ICD-10-CM

## 2018-07-07 MED ORDER — ONDANSETRON HCL 4 MG/2ML IJ SOLN
4.0000 mg | Freq: Once | INTRAMUSCULAR | Status: AC
  Administered 2018-07-07: 4 mg via INTRAMUSCULAR

## 2018-07-07 MED ORDER — MEPERIDINE HCL 50 MG/ML IJ SOLN
50.0000 mg | Freq: Once | INTRAMUSCULAR | Status: AC
  Administered 2018-07-07: 50 mg via INTRAMUSCULAR

## 2018-07-07 MED ORDER — ONDANSETRON HCL 4 MG/2ML IJ SOLN: 4.0000 mg | Freq: Four times a day (QID) | INTRAMUSCULAR | Status: DC | PRN

## 2018-07-07 MED ORDER — IOHEXOL 300 MG/ML  SOLN
10.0000 mL | Freq: Once | INTRAMUSCULAR | Status: AC | PRN
  Administered 2018-07-07: 10 mL via INTRATHECAL

## 2018-07-07 MED ORDER — DIAZEPAM 5 MG PO TABS
10.0000 mg | ORAL_TABLET | Freq: Once | ORAL | Status: AC
  Administered 2018-07-07: 10 mg via ORAL

## 2018-07-08 ENCOUNTER — Other Ambulatory Visit: Payer: Self-pay | Admitting: Neurosurgery

## 2018-07-08 DIAGNOSIS — M4712 Other spondylosis with myelopathy, cervical region: Secondary | ICD-10-CM

## 2018-07-14 ENCOUNTER — Inpatient Hospital Stay: Admission: RE | Admit: 2018-07-14 | Payer: Self-pay | Source: Ambulatory Visit

## 2018-07-21 ENCOUNTER — Inpatient Hospital Stay: Admission: RE | Admit: 2018-07-21 | Payer: Self-pay | Source: Ambulatory Visit

## 2018-09-02 ENCOUNTER — Other Ambulatory Visit: Payer: Self-pay | Admitting: Neurosurgery

## 2018-09-02 DIAGNOSIS — M47812 Spondylosis without myelopathy or radiculopathy, cervical region: Secondary | ICD-10-CM

## 2018-10-29 ENCOUNTER — Inpatient Hospital Stay
Admission: RE | Admit: 2018-10-29 | Discharge: 2018-10-29 | Disposition: A | Discharge status: Home / Self Care | Payer: Medicare Other | Source: Ambulatory Visit | Attending: Neurosurgery | Admitting: Neurosurgery

## 2018-10-29 NOTE — Discharge Instructions (Signed)

## 2019-08-12 IMAGING — DX DG CHEST 1V PORT
1 series · 1 of 1 positions shown · non-contrast
Comparison: Radiograph February 22, 2018.

CLINICAL DATA: Respiratory failure.

EXAM:
PORTABLE CHEST 1 VIEW

[chest ap]
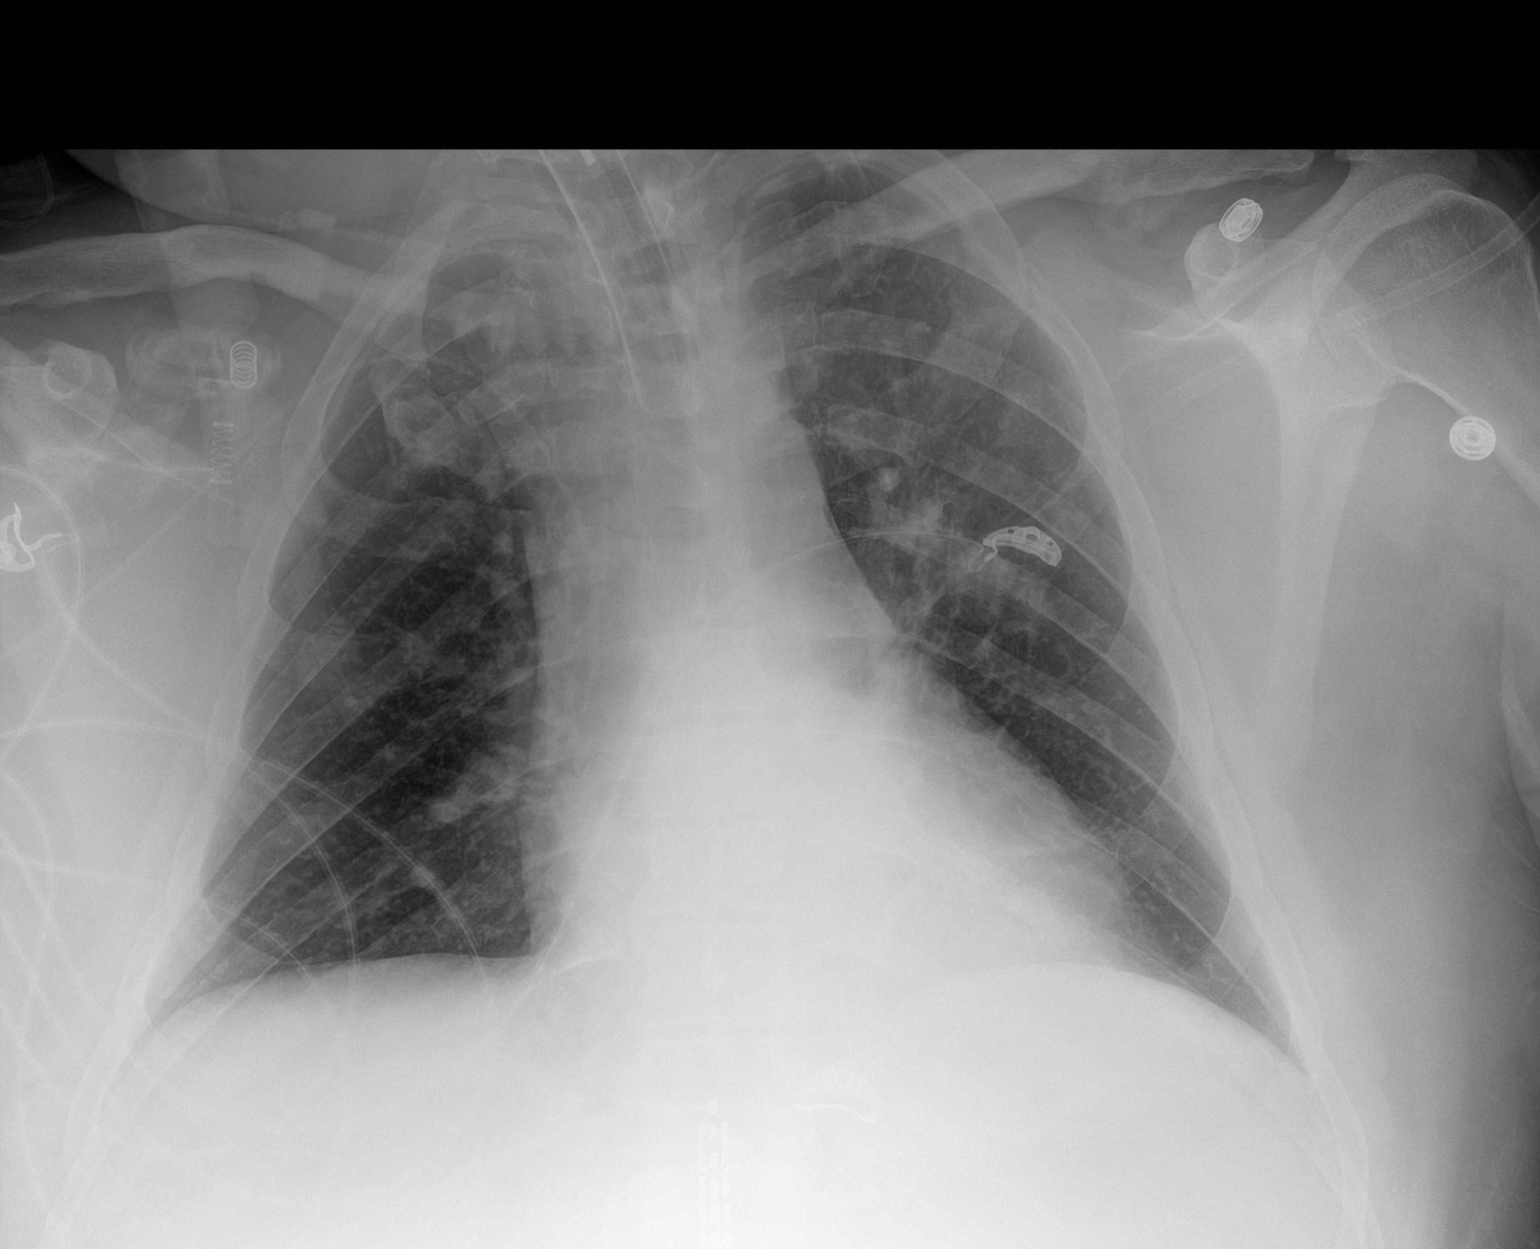

[1 of 1 positions shown; findings below may reference images not displayed]

FINDINGS: Stable cardiomediastinal silhouette. Endotracheal and nasogastric
tubes are unchanged in position. Interval placement of right-sided
PICC line with distal tip in expected position of the SVC. No
pneumothorax or pleural effusion is noted. No acute pulmonary
disease is noted. Bony thorax is unremarkable.
IMPRESSION: Interval placement of right sided PICC line with tip in SVC.
Otherwise stable support apparatus. No acute abnormality seen.

## 2019-08-14 IMAGING — DX DG CHEST 1V PORT
1 series · 1 of 1 positions shown · non-contrast
Comparison: 02/23/2018 chest radiograph.

CLINICAL DATA: Acute respiratory failure

EXAM:
PORTABLE CHEST 1 VIEW

[chest ap]
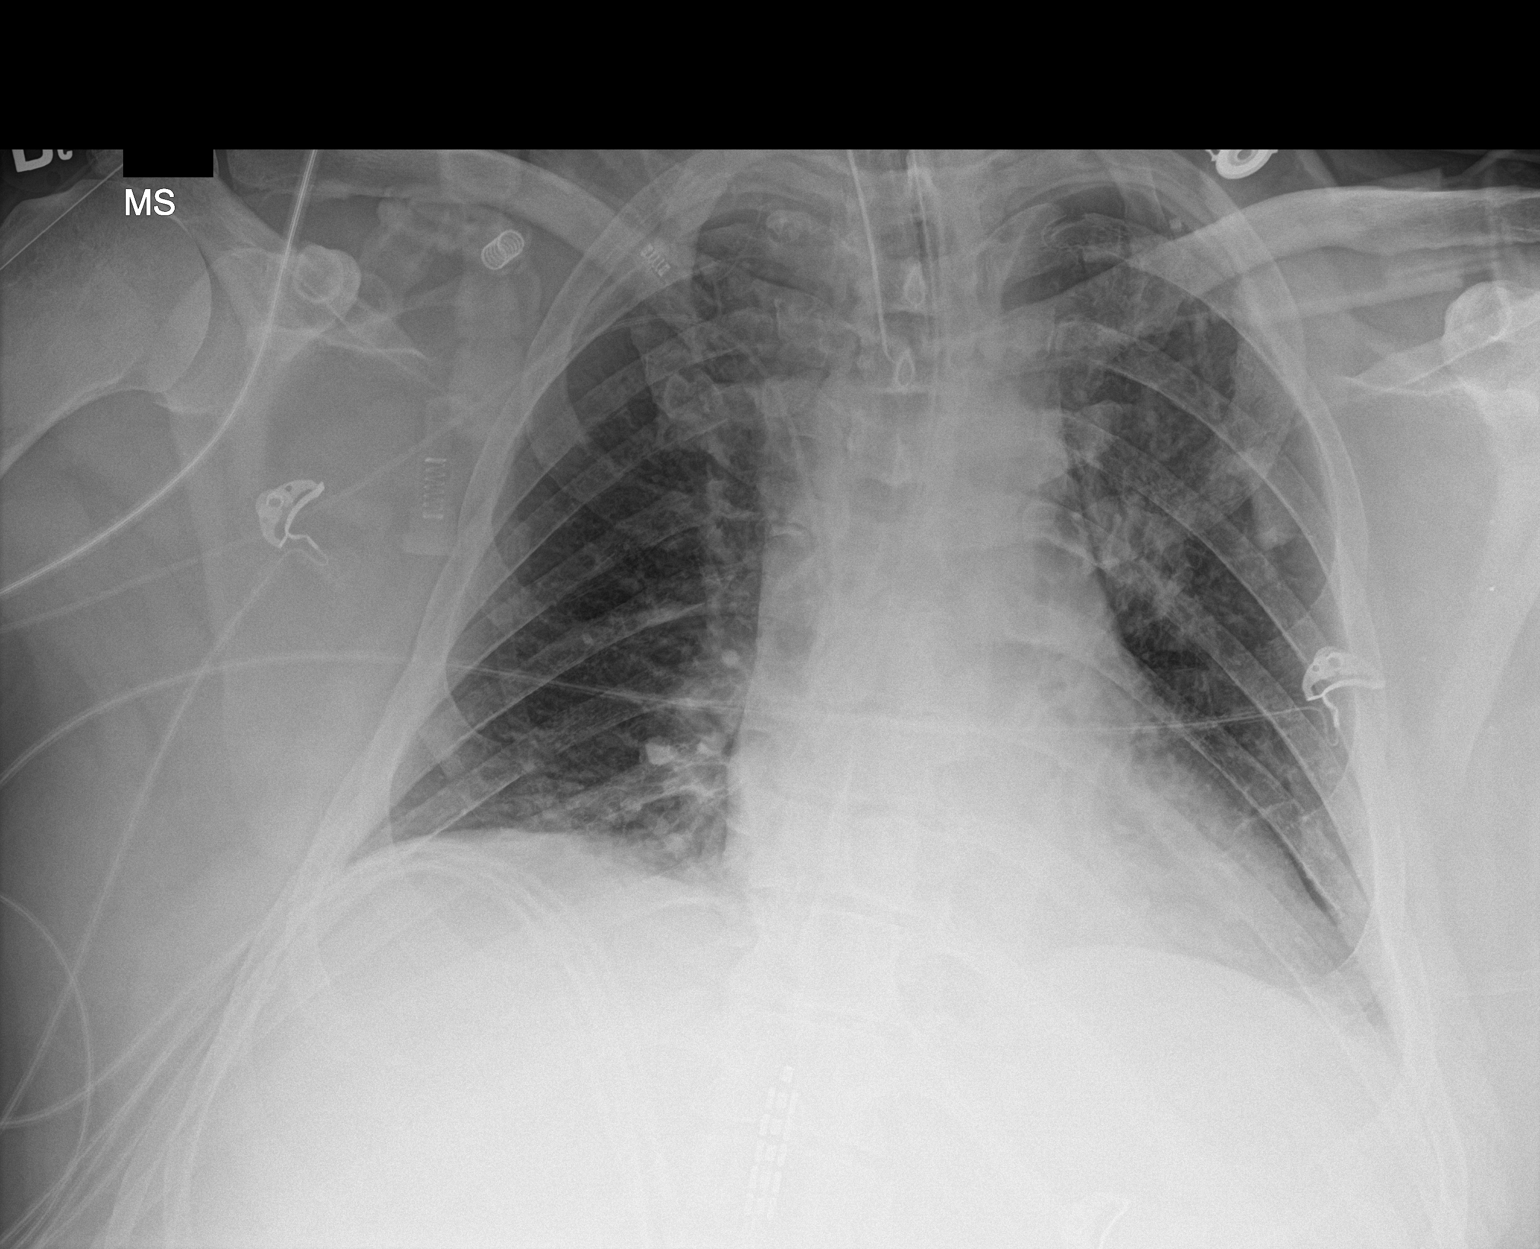

[1 of 1 positions shown; findings below may reference images not displayed]

FINDINGS: Endotracheal tube tip is 5.2 cm above the carina. Enteric tube
enters stomach with the tip not seen on this image. Right PICC
terminates in the lower third of the SVC. Stable cardiomediastinal
silhouette with top-normal heart size. No pneumothorax. No pleural
effusion. Mild bibasilar atelectasis. No pulmonary edema.
IMPRESSION: Well-positioned support structures.  Mild bibasilar atelectasis.

## 2019-08-16 IMAGING — DX DG CHEST 1V PORT
1 series · 1 of 1 positions shown · non-contrast
Comparison: 02/25/2018

CLINICAL DATA: Respiratory failure.

EXAM:
PORTABLE CHEST 1 VIEW

[chest ap]
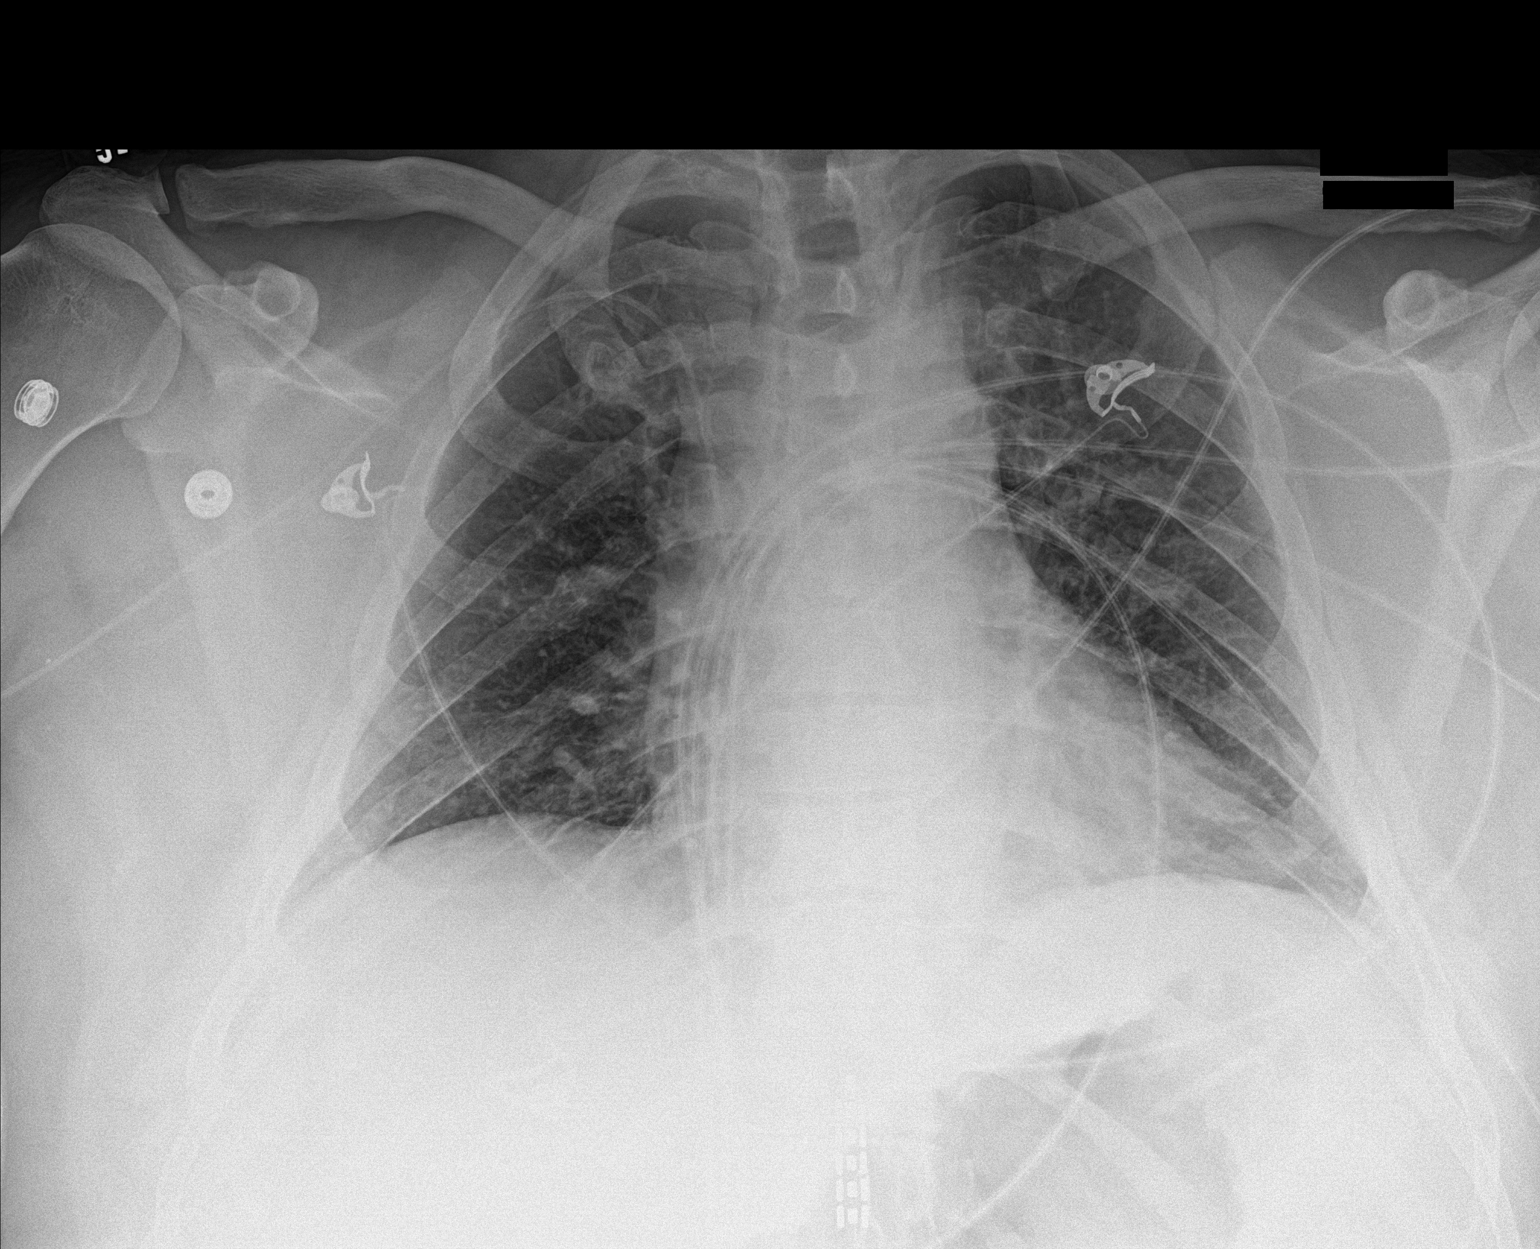

[1 of 1 positions shown; findings below may reference images not displayed]

FINDINGS: Endotracheal tube and NG tube have been removed. PICC tip remains at
the cavoatrial junction.

Heart size and vascularity are normal. Clearing of the minimal right
base atelectasis. Minimal residual left base atelectasis. No
effusions. No acute bone abnormality.
IMPRESSION: 1. Clearing of the atelectasis at the right base.
2. Minimal residual atelectasis at the left base.

## 2020-01-30 ENCOUNTER — Encounter (HOSPITAL_BASED_OUTPATIENT_CLINIC_OR_DEPARTMENT_OTHER): Payer: Medicare Other | Attending: Internal Medicine | Admitting: Internal Medicine

## 2021-05-30 ENCOUNTER — Emergency Department: Payer: Medicare Other

## 2021-05-30 ENCOUNTER — Observation Stay: Payer: Medicare Other

## 2021-05-30 ENCOUNTER — Inpatient Hospital Stay
Admission: EM | Admit: 2021-05-30 | Discharge: 2021-06-01 | DRG: 603 | Disposition: A | Discharge status: Home Health | Payer: Medicare Other | Attending: Internal Medicine | Admitting: Internal Medicine

## 2021-05-30 ENCOUNTER — Other Ambulatory Visit: Payer: Self-pay

## 2021-05-30 DIAGNOSIS — Z20822 Contact with and (suspected) exposure to covid-19: Secondary | ICD-10-CM | POA: Diagnosis present

## 2021-05-30 DIAGNOSIS — L0390 Cellulitis, unspecified: Secondary | ICD-10-CM | POA: Diagnosis not present

## 2021-05-30 DIAGNOSIS — I10 Essential (primary) hypertension: Secondary | ICD-10-CM | POA: Diagnosis present

## 2021-05-30 DIAGNOSIS — L03119 Cellulitis of unspecified part of limb: Secondary | ICD-10-CM

## 2021-05-30 DIAGNOSIS — E1142 Type 2 diabetes mellitus with diabetic polyneuropathy: Secondary | ICD-10-CM | POA: Diagnosis present

## 2021-05-30 DIAGNOSIS — I872 Venous insufficiency (chronic) (peripheral): Secondary | ICD-10-CM | POA: Diagnosis present

## 2021-05-30 DIAGNOSIS — S81809A Unspecified open wound, unspecified lower leg, initial encounter: Secondary | ICD-10-CM | POA: Diagnosis present

## 2021-05-30 DIAGNOSIS — L03116 Cellulitis of left lower limb: Secondary | ICD-10-CM | POA: Diagnosis present

## 2021-05-30 DIAGNOSIS — E11649 Type 2 diabetes mellitus with hypoglycemia without coma: Secondary | ICD-10-CM | POA: Diagnosis present

## 2021-05-30 DIAGNOSIS — I878 Other specified disorders of veins: Secondary | ICD-10-CM | POA: Diagnosis present

## 2021-05-30 DIAGNOSIS — K219 Gastro-esophageal reflux disease without esophagitis: Secondary | ICD-10-CM | POA: Diagnosis not present

## 2021-05-30 DIAGNOSIS — F17220 Nicotine dependence, chewing tobacco, uncomplicated: Secondary | ICD-10-CM | POA: Diagnosis present

## 2021-05-30 DIAGNOSIS — E7800 Pure hypercholesterolemia, unspecified: Secondary | ICD-10-CM | POA: Diagnosis not present

## 2021-05-30 DIAGNOSIS — Z6841 Body Mass Index (BMI) 40.0 and over, adult: Secondary | ICD-10-CM

## 2021-05-30 DIAGNOSIS — G894 Chronic pain syndrome: Secondary | ICD-10-CM | POA: Diagnosis present

## 2021-05-30 DIAGNOSIS — Z8249 Family history of ischemic heart disease and other diseases of the circulatory system: Secondary | ICD-10-CM

## 2021-05-30 DIAGNOSIS — E11610 Type 2 diabetes mellitus with diabetic neuropathic arthropathy: Secondary | ICD-10-CM | POA: Diagnosis not present

## 2021-05-30 DIAGNOSIS — G629 Polyneuropathy, unspecified: Secondary | ICD-10-CM

## 2021-05-30 DIAGNOSIS — S81802A Unspecified open wound, left lower leg, initial encounter: Secondary | ICD-10-CM

## 2021-05-30 DIAGNOSIS — I890 Lymphedema, not elsewhere classified: Secondary | ICD-10-CM | POA: Diagnosis present

## 2021-05-30 DIAGNOSIS — S81801A Unspecified open wound, right lower leg, initial encounter: Secondary | ICD-10-CM

## 2021-05-30 DIAGNOSIS — L03115 Cellulitis of right lower limb: Secondary | ICD-10-CM | POA: Diagnosis not present

## 2021-05-30 DIAGNOSIS — E119 Type 2 diabetes mellitus without complications: Secondary | ICD-10-CM

## 2021-05-30 DIAGNOSIS — G6289 Other specified polyneuropathies: Secondary | ICD-10-CM

## 2021-05-30 DIAGNOSIS — Z885 Allergy status to narcotic agent status: Secondary | ICD-10-CM

## 2021-05-30 DIAGNOSIS — Z79899 Other long term (current) drug therapy: Secondary | ICD-10-CM

## 2021-05-30 DIAGNOSIS — Z833 Family history of diabetes mellitus: Secondary | ICD-10-CM

## 2021-05-30 DIAGNOSIS — Z794 Long term (current) use of insulin: Secondary | ICD-10-CM

## 2021-05-30 DIAGNOSIS — F419 Anxiety disorder, unspecified: Secondary | ICD-10-CM | POA: Diagnosis present

## 2021-05-30 DIAGNOSIS — F32A Depression, unspecified: Secondary | ICD-10-CM | POA: Diagnosis present

## 2021-05-30 LAB — COMPREHENSIVE METABOLIC PANEL
ALT: 29 U/L (ref 0–44)
AST: 24 U/L (ref 15–41)
Albumin: 4 g/dL (ref 3.5–5.0)
Alkaline Phosphatase: 54 U/L (ref 38–126)
Anion gap: 6 (ref 5–15)
BUN: 24 mg/dL — ABNORMAL HIGH (ref 6–20)
CO2: 25 mmol/L (ref 22–32)
Calcium: 9.1 mg/dL (ref 8.9–10.3)
Chloride: 105 mmol/L (ref 98–111)
Creatinine, Ser: 0.98 mg/dL (ref 0.61–1.24)
GFR, Estimated: >60 mL/min (ref >60)
Glucose, Bld: 179 mg/dL — ABNORMAL HIGH (ref 70–99)
Potassium: 4.5 mmol/L (ref 3.5–5.1)
Sodium: 136 mmol/L (ref 135–145)
Total Bilirubin: 0.7 mg/dL (ref 0.3–1.2)
Total Protein: 7.6 g/dL (ref 6.5–8.1)

## 2021-05-30 LAB — CBC WITH DIFFERENTIAL/PLATELET
Abs Immature Granulocytes: 0.3 10*3/uL — ABNORMAL HIGH (ref 0.00–0.07)
Basophils Absolute: 0.1 10*3/uL (ref 0.0–0.1)
Basophils Relative: 1 %
Eosinophils Absolute: 0 10*3/uL (ref 0.0–0.5)
Eosinophils Relative: 1 %
HCT: 41.5 % (ref 39.0–52.0)
Hemoglobin: 13.7 g/dL (ref 13.0–17.0)
Immature Granulocytes: 4 %
Lymphocytes Relative: 26 %
Lymphs Abs: 2 10*3/uL (ref 0.7–4.0)
MCH: 31.6 pg (ref 26.0–34.0)
MCHC: 33 g/dL (ref 30.0–36.0)
MCV: 95.6 fL (ref 80.0–100.0)
Monocytes Absolute: 0.8 10*3/uL (ref 0.1–1.0)
Monocytes Relative: 11 %
Neutro Abs: 4.5 10*3/uL (ref 1.7–7.7)
Neutrophils Relative %: 57 %
Platelets: 170 10*3/uL (ref 150–400)
RBC: 4.34 MIL/uL (ref 4.22–5.81)
RDW: 13.5 % (ref 11.5–15.5)
WBC: 7.7 10*3/uL (ref 4.0–10.5)
nRBC: 0 % (ref 0.0–0.2)

## 2021-05-30 LAB — URINALYSIS, ROUTINE W REFLEX MICROSCOPIC
Bilirubin Urine: NEGATIVE
Glucose, UA: 500 mg/dL — AB
Hgb urine dipstick: NEGATIVE
Ketones, ur: NEGATIVE mg/dL
Leukocytes,Ua: NEGATIVE
Nitrite: NEGATIVE
Protein, ur: NEGATIVE mg/dL
Specific Gravity, Urine: 1.025 (ref 1.005–1.030)
pH: 5.5 (ref 5.0–8.0)

## 2021-05-30 LAB — RESP PANEL BY RT-PCR (FLU A&B, COVID) ARPGX2
Influenza A by PCR: NEGATIVE
Influenza B by PCR: NEGATIVE
SARS Coronavirus 2 by RT PCR: NEGATIVE

## 2021-05-30 LAB — LACTIC ACID, PLASMA: Lactic Acid, Venous: 1.9 mmol/L (ref 0.5–1.9)

## 2021-05-30 LAB — CBG MONITORING, ED
Glucose-Capillary: 41 mg/dL — CL (ref 70–99)
Glucose-Capillary: 96 mg/dL (ref 70–99)

## 2021-05-30 LAB — BRAIN NATRIURETIC PEPTIDE: B Natriuretic Peptide: 6.1 pg/mL (ref 0.0–100.0)

## 2021-05-30 MED ORDER — POLYETHYLENE GLYCOL 3350 17 G PO PACK: 17.0000 g | PACK | Freq: Every day | ORAL | Status: DC | PRN

## 2021-05-30 MED ORDER — ACETAMINOPHEN 325 MG PO TABS
650.0000 mg | ORAL_TABLET | Freq: Four times a day (QID) | ORAL | Status: DC | PRN
  Administered 2021-05-30 – 2021-05-31 (×2): 650 mg via ORAL
  Filled 2021-05-30 (×2): qty 2

## 2021-05-30 MED ORDER — DEXTROSE 50 % IV SOLN
INTRAVENOUS | Status: AC
  Filled 2021-05-30: qty 50

## 2021-05-30 MED ORDER — ACETAMINOPHEN 650 MG RE SUPP
650.0000 mg | Freq: Four times a day (QID) | RECTAL | Status: DC | PRN
  Filled 2021-05-30: qty 1

## 2021-05-30 MED ORDER — ONDANSETRON HCL 4 MG PO TABS: 4.0000 mg | ORAL_TABLET | Freq: Four times a day (QID) | ORAL | Status: DC | PRN

## 2021-05-30 MED ORDER — SODIUM CHLORIDE 0.9 % IV SOLN
2.0000 g | INTRAVENOUS | Status: DC
  Administered 2021-05-30 – 2021-05-31 (×2): 2 g via INTRAVENOUS
  Filled 2021-05-30: qty 2
  Filled 2021-05-30 (×2): qty 20

## 2021-05-30 MED ORDER — FUROSEMIDE 10 MG/ML IJ SOLN
40.0000 mg | Freq: Two times a day (BID) | INTRAMUSCULAR | Status: AC
  Administered 2021-05-30 – 2021-06-01 (×4): 40 mg via INTRAVENOUS
  Filled 2021-05-30 (×4): qty 4

## 2021-05-30 MED ORDER — VANCOMYCIN HCL IN DEXTROSE 1-5 GM/200ML-% IV SOLN
1000.0000 mg | Freq: Once | INTRAVENOUS | Status: AC
  Administered 2021-05-30: 17:00:00 1000 mg via INTRAVENOUS
  Filled 2021-05-30: qty 200

## 2021-05-30 MED ORDER — METRONIDAZOLE 500 MG PO TABS
500.0000 mg | ORAL_TABLET | Freq: Three times a day (TID) | ORAL | Status: DC
  Administered 2021-05-30 – 2021-06-01 (×5): 500 mg via ORAL
  Filled 2021-05-30 (×7): qty 1

## 2021-05-30 MED ORDER — FENTANYL CITRATE PF 50 MCG/ML IJ SOSY
25.0000 ug | PREFILLED_SYRINGE | INTRAMUSCULAR | Status: DC | PRN
  Administered 2021-05-31 – 2021-06-01 (×5): 25 ug via INTRAVENOUS
  Filled 2021-05-30 (×5): qty 1

## 2021-05-30 MED ORDER — INSULIN ASPART 100 UNIT/ML IJ SOLN
0.0000 [IU] | Freq: Three times a day (TID) | INTRAMUSCULAR | Status: DC
  Administered 2021-05-31: 17:00:00 2 [IU] via SUBCUTANEOUS
  Administered 2021-05-31: 12:00:00 3 [IU] via SUBCUTANEOUS
  Administered 2021-05-31: 08:00:00 2 [IU] via SUBCUTANEOUS
  Administered 2021-06-01: 10:00:00 7 [IU] via SUBCUTANEOUS
  Administered 2021-06-01: 11:00:00 9 [IU] via SUBCUTANEOUS
  Filled 2021-05-30 (×5): qty 1

## 2021-05-30 MED ORDER — SODIUM CHLORIDE 0.9 % IV BOLUS
500.0000 mL | Freq: Once | INTRAVENOUS | Status: AC
  Administered 2021-05-30: 17:00:00 500 mL via INTRAVENOUS

## 2021-05-30 MED ORDER — OXYCODONE HCL 5 MG PO TABS
5.0000 mg | ORAL_TABLET | ORAL | Status: DC | PRN
  Administered 2021-05-30 – 2021-06-01 (×7): 5 mg via ORAL
  Filled 2021-05-30 (×7): qty 1

## 2021-05-30 MED ORDER — DEXTROSE 50 % IV SOLN
1.0000 | Freq: Once | INTRAVENOUS | Status: AC
  Administered 2021-05-30: 17:00:00 50 mL via INTRAVENOUS

## 2021-05-30 MED ORDER — HYDRALAZINE HCL 50 MG PO TABS: 25.0000 mg | ORAL_TABLET | Freq: Three times a day (TID) | ORAL | Status: DC | PRN

## 2021-05-30 MED ORDER — IOHEXOL 300 MG/ML  SOLN
100.0000 mL | Freq: Once | INTRAMUSCULAR | Status: AC | PRN
  Administered 2021-05-30: 21:00:00 100 mL via INTRAVENOUS
  Filled 2021-05-30: qty 100

## 2021-05-30 MED ORDER — GEMFIBROZIL 600 MG PO TABS
600.0000 mg | ORAL_TABLET | Freq: Two times a day (BID) | ORAL | Status: DC
  Administered 2021-05-31 (×2): 600 mg via ORAL
  Filled 2021-05-30 (×5): qty 1

## 2021-05-30 MED ORDER — PANTOPRAZOLE SODIUM 40 MG PO TBEC
40.0000 mg | DELAYED_RELEASE_TABLET | Freq: Every day | ORAL | Status: DC
  Administered 2021-05-30 – 2021-05-31 (×2): 40 mg via ORAL
  Filled 2021-05-30 (×3): qty 1

## 2021-05-30 MED ORDER — ONDANSETRON HCL 4 MG/2ML IJ SOLN: 4.0000 mg | Freq: Four times a day (QID) | INTRAMUSCULAR | Status: DC | PRN

## 2021-05-30 MED ORDER — DULOXETINE HCL 30 MG PO CPEP
60.0000 mg | ORAL_CAPSULE | Freq: Two times a day (BID) | ORAL | Status: DC
  Administered 2021-05-30 – 2021-05-31 (×3): 60 mg via ORAL
  Filled 2021-05-30: qty 1
  Filled 2021-05-30: qty 2
  Filled 2021-05-30: qty 1

## 2021-05-30 MED ORDER — FENTANYL CITRATE PF 50 MCG/ML IJ SOSY
75.0000 ug | PREFILLED_SYRINGE | Freq: Once | INTRAMUSCULAR | Status: AC
  Administered 2021-05-30: 17:00:00 75 ug via INTRAVENOUS
  Filled 2021-05-30: qty 2

## 2021-05-30 MED ORDER — ATORVASTATIN CALCIUM 20 MG PO TABS
20.0000 mg | ORAL_TABLET | Freq: Every day | ORAL | Status: DC
  Administered 2021-05-30 – 2021-05-31 (×2): 20 mg via ORAL
  Filled 2021-05-30 (×2): qty 1

## 2021-05-30 MED ORDER — INSULIN ASPART 100 UNIT/ML IJ SOLN: 0.0000 [IU] | Freq: Every day | INTRAMUSCULAR | Status: DC

## 2021-05-30 MED ORDER — ENOXAPARIN SODIUM 80 MG/0.8ML IJ SOSY
0.5000 mg/kg | PREFILLED_SYRINGE | INTRAMUSCULAR | Status: DC
  Filled 2021-05-30 (×2): qty 0.8

## 2021-05-30 NOTE — H&P (Signed)
History and Physical    Martin Riddle CWU:[id number] DOB: March 05, 1971 DOA: 05/30/2021  PCP: Kirk Ruths, MD  Patient coming from: Home  I have personally briefly reviewed patient's old medical records in [place]  Chief Complaint: Lower extremity pain, wounds, edema  HPI: Martin Riddle is a 50 y.o. male with medical history significant of type 2 diabetes mellitus, essential hypertension, GERD, hyperlipidemia, peripheral neuropathy, chronic pain syndrome s/p spinal cord stimulator who presents to [facility] ED on 12/22 with progressive lower extremity edema, redness, and difficulty ambulating greatest on right.  Patient states this has been present for several months and has progressively worsened.  His spouse reports that he has been weeping through his skin and had attempted compression wraps at home without success.  Patient denies any trauma, does still have feeling to his feet but has had several wounds in the past that required debridement by podiatry.  Patient denies headache, no dizziness, no chest pain, no palpitations, no fever/chills/night sweats, no nausea/vomiting/diarrhea, no abdominal pain, no fatigue.  ED Course: Temperature 98.2 F, HR 06/09/2018, RR 18, BP 136/69, SPO2 98% on room air.  WBC 7.7, hemoglobin 13.7, platelets 170.  Sodium 136, potassium 4.5, chloride 105, CO2 25, glucose 179, BUN 24, creatinine 0.98.  Lactic acid 1.9.  Urinalysis with 500 glucose, otherwise unremarkable.  Chest x-ray with linear areas of airspace disease left base consistent with atelectasis versus scarring, no consolidation or effusion.  Right foot x-ray with no osteomyelitis, no fracture, no dislocation but with diffuse soft subcutaneous tissue edema and possible linear density plantar surface of the foot concerning for foreign body.  Patient was given IV vancomycin, fentanyl, 500 NS bolus by EDP.  Hospital service consulted for further evaluation and management of lower extremity  cellulitis, wounds, and concern for possible foreign body right foot.  Review of Systems:  Constitutional - No Fatigue, No Weight Loss Vision - No impaired vision, decreased visual acuity Ear/Nose/Mouth/Throat - No decreased hearing, no congestion Respiratory - No shortness of breath, no exertional dyspnea, chronic cough Cardiovascular - No chest pain, no palpitations, + peripheral edema Gastrointestinal - No nausea, no diarrhea, no constipation, Genitourinary - No excessive urination, no urinary incontinence Integumentary - + concerning skin lesions to bilateral lower extremities/feet Neurologic -  no headaches, no confusion or memory loss   Past Medical History:  Diagnosis Date   Diabetes mellitus without complication ([place])    Hypercholesteremia    Hypertension    Pancreatitis     Past Surgical History:  Procedure Laterality Date   ANKLE SURGERY Left 2001,2003   BACK SURGERY  [phone]     reports that he has quit smoking. His smokeless tobacco use includes chew. He reports that he does not drink alcohol and does not use drugs.  Allergies  Allergen Reactions   Morphine And Related Other (See Comments)    headache    Family History  Problem Relation Age of Onset   Heart disease Mother    Diabetes Mother    Diabetes Sister     Family history reviewed and not pertinent   Prior to Admission medications   Medication Sig Start Date End Date Taking? Authorizing Provider  atorvastatin (LIPITOR) 20 MG tablet Take 20 mg by mouth daily. 12/25/17   [provider]  cetirizine (ZYRTEC) 10 MG tablet Take 10 mg by mouth daily.    [provider]  DULoxetine (CYMBALTA) 60 MG capsule Take 60 mg by mouth 2 (two) times daily. 01/27/18  [provider]  gemfibrozil (LOPID) 600 MG tablet Take 1 tablet (600 mg total) by mouth 2 (two) times daily before a meal. 01/14/17   Hildred Priest, MD  insulin detemir (LEVEMIR) 100 UNIT/ML injection Inject 0.2  mLs (20 Units total) into the skin 2 (two) times daily. 02/28/18   Vaughan Basta, MD  insulin lispro (HUMALOG) 100 UNIT/ML injection Inject 0.07 mLs (7 Units total) into the skin 3 (three) times daily with meals. 02/28/18   Vaughan Basta, MD  lisinopril-hydrochlorothiazide (PRINZIDE,ZESTORETIC) 20-12.5 MG tablet Take 2 tablets by mouth daily. 02/28/18   Vaughan Basta, MD  LORazepam (ATIVAN) 2 MG tablet Take 2 mg by mouth 2 (two) times daily. 01/01/18   [provider]  methocarbamol (ROBAXIN) 750 MG tablet Take 1 tablet (750 mg total) by mouth every 6 (six) hours as needed for muscle spasms. 01/15/17   Hildred Priest, MD  omeprazole (PRILOSEC) 20 MG capsule Take 20 mg by mouth daily. 12/20/17   [provider]  oxycodone (ROXICODONE) 30 MG immediate release tablet Take 30 mg by mouth every 4 (four) hours as needed for pain. 02/04/18   [provider]  polyethylene glycol (MIRALAX / GLYCOLAX) packet Take 17 g by mouth daily. 01/15/17   Hildred Priest, MD    Physical Exam: Vitals:   05/30/21 1645 05/30/21 1700 05/30/21 1715 05/30/21 1736  BP:  136/69  (!) 130/56  Pulse: (!) 106 (!) 105 (!) 103 99  Resp:    20  Temp:    98.2 F (36.8 C)  TempSrc:    Oral  SpO2: 92% 93% 95% 98%  Weight:      Height:        Constitutional: NAD, calm, comfortable, obese Eyes: PERRL, lids and conjunctivae normal ENMT: Mucous membranes are moist. Posterior pharynx clear of any exudate or lesions.Normal dentition.  Neck: normal, supple, no masses, no thyromegaly Respiratory: clear to auscultation bilaterally, no wheezing, no crackles. Normal respiratory effort. No accessory muscle use.  Cardiovascular: Regular rate and rhythm, no murmurs / rubs / gallops. No extremity edema. 2+ pedal pulses. No carotid bruits.  Abdomen: no tenderness, no masses palpated. No hepatosplenomegaly. Bowel sounds positive.  Musculoskeletal: no clubbing / cyanosis. No  joint deformity upper and lower extremities. Good ROM, no contractures. Normal muscle tone.  Skin: 2-3+ pitting edema bilateral lower extremities up to knees with associated erythema and multiple wounds; see pictures below Neurologic: CN 2-12 grossly intact. Sensation intact, DTR normal. Strength 5/5 in all 4.  Psychiatric: Normal judgment and insight. Alert and oriented x 3. Normal mood.                Labs on Admission: I have personally reviewed following labs and imaging studies  CBC: Recent Labs  Lab 05/30/21 1243  WBC 7.7  NEUTROABS 4.5  HGB 13.7  HCT 41.5  MCV 95.6  PLT 001   Basic Metabolic Panel: Recent Labs  Lab 05/30/21 1243  NA 136  K 4.5  CL 105  CO2 25  GLUCOSE 179*  BUN 24*  CREATININE 0.98  CALCIUM 9.1   GFR: Estimated Creatinine Clearance: 145.8 mL/min (by C-G formula based on SCr of 0.98 mg/dL). Liver Function Tests: Recent Labs  Lab 05/30/21 1243  AST 24  ALT 29  ALKPHOS 54  BILITOT 0.7  PROT 7.6  ALBUMIN 4.0   No results for input(s): LIPASE, AMYLASE in the last 168 hours. No results for input(s): AMMONIA in the last 168 hours. Coagulation Profile: No results  for input(s): INR, PROTIME in the last 168 hours. Cardiac Enzymes: No results for input(s): CKTOTAL, CKMB, CKMBINDEX, TROPONINI in the last 168 hours. BNP (last 3 results) No results for input(s): PROBNP in the last 8760 hours. HbA1C: No results for input(s): HGBA1C in the last 72 hours. CBG: Recent Labs  Lab 05/30/21 1723 05/30/21 1809  GLUCAP 41* 96   Lipid Profile: No results for input(s): CHOL, HDL, LDLCALC, TRIG, CHOLHDL, LDLDIRECT in the last 72 hours. Thyroid Function Tests: No results for input(s): TSH, T4TOTAL, FREET4, T3FREE, THYROIDAB in the last 72 hours. Anemia Panel: No results for input(s): VITAMINB12, FOLATE, FERRITIN, TIBC, IRON, RETICCTPCT in the last 72 hours. Urine analysis:    Component Value Date/Time   COLORURINE YELLOW 05/30/2021 1243    APPEARANCEUR CLEAR 05/30/2021 1243   APPEARANCEUR Clear 03/10/2014 1305   LABSPEC 1.025 05/30/2021 1243   LABSPEC 1.028 03/10/2014 1305   PHURINE 5.5 05/30/2021 1243   GLUCOSEU 500 (A) 05/30/2021 1243   GLUCOSEU >=500 03/10/2014 1305   HGBUR NEGATIVE 05/30/2021 1243   BILIRUBINUR NEGATIVE 05/30/2021 1243   BILIRUBINUR Negative 03/10/2014 1305   KETONESUR NEGATIVE 05/30/2021 1243   PROTEINUR NEGATIVE 05/30/2021 1243   UROBILINOGEN 1.0 10/12/2009 1213   NITRITE NEGATIVE 05/30/2021 1243   LEUKOCYTESUR NEGATIVE 05/30/2021 1243   LEUKOCYTESUR Negative 03/10/2014 1305    Radiological Exams on Admission: DG Chest 2 View  Result Date: 05/30/2021 CLINICAL DATA:  A 50 year old male presents for evaluation of lower extremity edema for the past several months. EXAM: CHEST - 2 VIEW COMPARISON:  February 27, 2018. FINDINGS: Trachea midline. Cardiomediastinal contours and hilar structures are normal. Linear areas of airspace disease at the LEFT lung base overlying the LEFT hemidiaphragm. No signs of lobar consolidation. The no pleural effusion. Spinal nerve stimulator projects over the lower thoracic spine. No acute skeletal process on limited assessment. IMPRESSION: Linear areas of airspace disease at the LEFT lung base, findings may represent atelectasis or scarring. No signs of lobar consolidation or pleural effusion. Electronically Signed   By: Zetta Bills M.D.   On: 05/30/2021 13:07   DG Foot Complete Right  Result Date: 05/30/2021 CLINICAL DATA:  pain, concern for infection EXAM: RIGHT FOOT COMPLETE - 3+ VIEW COMPARISON:  None. FINDINGS: No evidence of fracture, dislocation, or joint effusion. No cortical erosion or destruction. No evidence of severe arthropathy. No aggressive appearing focal bone abnormality. Diffuse dorsal subcutaneus soft tissue edema. 48m thin linear density along the plantar aspect of the midfoot may represent a retained radiopaque foreign body. IMPRESSION: 1. No  radiographic finding to suggest osteomyelitis. 2. No acute displaced fracture or dislocation. 3.  Diffuse dorsal subcutaneus soft tissue edema. 4. 768mthin linear density along the plantar aspect of the midfoot may represent a retained radiopaque foreign body. Electronically Signed   By: MoIven Finn.D.   On: 05/30/2021 17:05    EKG: Independently reviewed.   Assessment/Plan Principal Problem:   Cellulitis Active Problems:   Diabetes mellitus, type 2 (HCC)   Hypercholesteremia   Gastro-esophageal reflux disease without esophagitis   Peripheral neuropathy   Multiple open wounds of lower extremity, initial encounter    Bilateral lower extremity cellulitis with multiple wounds Questionable right foot foreign body Patient presenting to ED with progressive lower extremity edema, erythema despite attempts at compression wraps at home.  Patient now with difficulty ambulating.  States this has been going on for several months.  Patient is afebrile without leukocytosis.  Lactic acid 1.9.  Right foot x-ray  with no osteomyelitis, no fracture or dislocation but with linear density plantar surface and diffuse subcutaneous tissue edema. --Admit to observation --Check ESR, CRP --Check D-dimer if positive will need U/S to assess for DVT --TTE given intermittent dyspnea and lower extremity edema --Wound care consult --CT bilateral lower extremities/feet for further evaluation of underlying abscess, osteomyelitis and possible foreign body --Vascular duplex ultrasound to assess ABIs --Ceftriaxone 2 g IV every 24 hours --Metronidazole 500 mg p.o. TID --Furosemide 40 mg IV every 12 hours --Strict I's and O's and daily weights --Consulted podiatry, Dr. Luana Shu who will see tomorrow morning --CBC/BMP in a.m.  Essential hypertension Patient reports on HCTZ/lisinopril and clonidine at baseline.  Awaiting pharmacy reconciliation. --BP currently 136/69 --Hydralazine 25 mg p.o. q8h PRN SBP >170 or DBP  >110  Type 2 diabetes mellitus Patient reports on Novolin R, takes varying doses throughout the day.  Patient had hypoglycemic event while in the ED s/p D50 amp. --Diabetic educator consult --Hemoglobin A1c --Sensitive insulin sliding scale for coverage --CBGs qAC/HS  Depression/anxiety Peripheral neuropathy --Cymbalta 60 mg p.o. twice daily --Buspirone 15 mg p.o. 3 times daily  Chronic pain syndrome S/p spinal cord stimulator. --Oxycodone 5 mg p.o. every 4 hours as needed moderate pain --Fentanyl 25 mcg IV every 3 hours as needed severe pain  GERD: --Protonix 40 mg p.o. daily  Hyperlipidemia: Atorvastatin 20 mg p.o. daily  Morbid obesity Body mass index is 42.6 kg/m.  Discussed with patient needs for aggressive lifestyle changes/weight loss as this complicates all facets of care.  Outpatient follow-up with PCP.  May benefit from bariatric evaluation outpatient.    DVT prophylaxis: Lovenox Code Status: Full code Family Communication: Spouse present at bedside Disposition Plan: Anticipate discharge home when medically ready Consults called: Message sent to podiatry for evaluation tomorrow Admission status: Observation Level of care: Med-Surg   At the point of initial evaluation, it is my clinical opinion that admission for OBSERVATION is reasonable and necessary because the patient's presenting complaints in the context of their chronic conditions represent sufficient risk of deterioration or significant morbidity to constitute reasonable grounds for close observation in the hospital setting, but that the patient may be medically stable for discharge from the hospital within 24 to 48 hours.    Kerah Hardebeck J [place] DO [facility] Available via Epic secure chat 7am-7pm After these hours, please refer to coverage provider listed on [email or web address] 05/30/2021, 6:19 PM

## 2021-05-30 NOTE — Progress Notes (Signed)
PHARMACIST - PHYSICIAN COMMUNICATION  CONCERNING:  Enoxaparin (Lovenox) for DVT Prophylaxis    RECOMMENDATION: Patient was prescribed enoxaprin 40mg  q24 hours for VTE prophylaxis.   Filed Weights   05/30/21 1241  Weight: (!) 158.8 kg (350 lb)    Body mass index is 42.6 kg/m.  Estimated Creatinine Clearance: 145.8 mL/min (by C-G formula based on SCr of 0.98 mg/dL).   Based on [facility] policy patient is candidate for enoxaparin 0.5mg /kg TBW SQ every 24 hours based on BMI being >30.  Patient is candidate for enoxaparin 30mg  every 24 hours based on CrCl <46ml/min or Weight <45kg  DESCRIPTION: Pharmacy has adjusted enoxaparin dose per [facility] policy.  Patient is now receiving enoxaparin 80 mg every 24 hours    31m, PharmD Clinical Pharmacist  05/30/2021 8:17 PM

## 2021-05-30 NOTE — ED Provider Notes (Signed)
[facility] Department Provider Note  ____________________________________________   I have reviewed the triage vital signs and the nursing notes.   HISTORY  Chief Complaint Leg Swelling   History limited by: Not Limited   HPI Adien Kimmel is a 50 y.o. male who presents to the emergency department today because of concern for right foot pain. The patient does have some chronic pain in the right foot but it has been worse over the past few days. Denies any recent trauma to his foot. Has history of ulcers and wounds to his bilateral legs. He has noticed some recent warmth to his legs. Denies any fevers. No nausea or vomiting. Has history of diabetes.    Records reviewed. Per medical record review patient has a history of DM, HLD, HTN. Has had foot wounds in past, has seen podiatry and had debridement done.   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 Authorizing Provider  atorvastatin (LIPITOR) 20 MG tablet Take 20 mg by mouth daily. 12/25/17   [provider]  cetirizine (ZYRTEC) 10 MG tablet Take 10 mg by mouth daily.    [provider]  DULoxetine (CYMBALTA) 60 MG capsule Take 60 mg by mouth  2 (two) times daily. 01/27/18   [provider]  gemfibrozil (LOPID) 600 MG tablet Take 1 tablet (600 mg total) by mouth 2 (two) times daily before a meal. 01/14/17   Jimmy Footman, MD  insulin detemir (LEVEMIR) 100 UNIT/ML injection Inject 0.2 mLs (20 Units total) into the skin 2 (two) times daily. 02/28/18   Altamese Dilling, MD  insulin lispro (HUMALOG) 100 UNIT/ML injection Inject 0.07 mLs (7 Units total) into the skin 3 (three) times daily with meals. 02/28/18   Altamese Dilling, MD  lisinopril-hydrochlorothiazide (PRINZIDE,ZESTORETIC) 20-12.5 MG tablet Take 2 tablets by mouth daily. 02/28/18   Altamese Dilling, MD  LORazepam (ATIVAN) 2 MG tablet Take 2 mg by mouth 2 (two) times daily. 01/01/18   [provider]  methocarbamol (ROBAXIN) 750 MG tablet Take 1 tablet (750 mg total) by mouth every 6 (six) hours as needed for muscle spasms. 01/15/17   Jimmy Footman, MD  omeprazole (PRILOSEC) 20 MG capsule Take 20 mg by mouth daily. 12/20/17   [provider]  oxycodone (ROXICODONE) 30 MG immediate release tablet Take 30 mg by mouth every 4 (four) hours as needed for pain. 02/04/18   [provider]  polyethylene glycol (MIRALAX / GLYCOLAX) packet Take 17 g by mouth daily. 01/15/17   Jimmy Footman, MD    Allergies Morphine and related  Family History  Problem Relation Age of Onset   Heart disease Mother    Diabetes Mother    Diabetes Sister     Social History Social History  Tobacco Use   Smoking status: Former    Years: 7.00    Types: Cigarettes   Smokeless tobacco: Current    Types: Chew  Vaping Use   Vaping Use: Never used  Substance Use Topics   Alcohol use: No    Alcohol/week: 0.0 standard drinks   Drug use: No    Review of Systems Constitutional: No fever/chills Eyes: No visual changes. ENT: No sore throat. Cardiovascular: Denies chest pain. Respiratory: Denies shortness of  breath. Gastrointestinal: No abdominal pain.  No nausea, no vomiting.  No diarrhea.   Genitourinary: Negative for dysuria. Musculoskeletal: Positive for right foot pain. Positive for bilateral leg swelling.  Skin: Positive for erythema to bilateral lower legs, positive for wounds to bilateral legs.  Neurological: Negative for headaches, focal weakness or numbness.  ____________________________________________   PHYSICAL EXAM:  VITAL SIGNS: ED Triage Vitals  Enc Vitals Group     BP 05/30/21 1239 (!) 144/96     Pulse Rate 05/30/21 1239 (!) 118     Resp 05/30/21 1239 18     Temp 05/30/21 1239 98.2 F (36.8 C)     Temp Source 05/30/21 1239 Oral     SpO2 05/30/21 1239 93 %     Weight 05/30/21 1241 (!) 350 lb (158.8 kg)     Height 05/30/21 1241 6\' 4"  (1.93 m)     Head Circumference --      Peak Flow --      Pain Score 05/30/21 1240 9   Constitutional: Alert and oriented.  Eyes: Conjunctivae are normal.  ENT      Head: Normocephalic and atraumatic.      Nose: No congestion/rhinnorhea.      Mouth/Throat: Mucous membranes are moist.      Neck: No stridor. Hematological/Lymphatic/Immunilogical: No cervical lymphadenopathy. Cardiovascular: Tachycardic,  regular rhythm.  No murmurs, rubs, or gallops.  Respiratory: Normal respiratory effort without tachypnea nor retractions. Breath sounds are clear and equal bilaterally. No wheezes/rales/rhonchi. Gastrointestinal: Soft and non tender. No rebound. No guarding.  Genitourinary: Deferred Musculoskeletal: Normal range of motion in all extremities. No lower extremity edema. Neurologic:  Normal speech and language. No gross focal neurologic deficits are appreciated.  Skin:  Erythema to lower legs, multiple wounds on both legs.  Psychiatric: Mood and affect are normal. Speech and behavior are normal. Patient exhibits appropriate insight and judgment.  ____________________________________________    LABS (pertinent  positives/negatives)  BNP 6.1 Lactic acid 1.9 CMP wnl except glu 179, BUN 24 UA glucose 500 otherwise unremarkable CBC wbc 7.7, hgb 13.7, plt 170 ____________________________________________   EKG  None  ____________________________________________    RADIOLOGY  CXR Left lung base linear airspace disease consistent with atelectasis vs scarring  ____________________________________________   PROCEDURES  Procedures  ____________________________________________   INITIAL IMPRESSION / ASSESSMENT AND PLAN / ED COURSE  Pertinent labs & imaging results that were available during my care of the patient were reviewed by me and considered in my medical decision making (see chart for details).   Patient presented to the emergency department today because of concern for right foot pain. On exam patient with swelling, erythema and open wounds to both legs. X-ray without obvious osteomyelitis, however given extent of erythema, wounds and history of diabetes I do have concern for worsening cellulitis. Feel patient would benefit from IV abx. Discussed this with the patient. While awaiting admission he did feel like his blood sugar was dropping, it was checked and found to be in the 40s. Patient ordered amp  of D50.   ____________________________________________   FINAL CLINICAL IMPRESSION(S) / ED DIAGNOSES  Final diagnoses:  Cellulitis, unspecified cellulitis site     Note: This dictation was prepared with Dragon dictation. Any transcriptional errors that result from this process are unintentional     Phineas Semen, MD 05/30/21 1731

## 2021-05-30 NOTE — ED Notes (Signed)
Pt with redness and sores in both lower legs and feet.  Pt had una boots on both legs, removed by this rn.    No chest pain.  Intermittent sob.  Pt alert   family with pt.  Hx diabetes and htn.

## 2021-05-30 NOTE — ED Notes (Signed)
Fsbs 96 

## 2021-05-30 NOTE — ED Notes (Signed)
Fsbs 41.  Md aware, meds given.  Pt eating  pt alert, skin warm and dry  family with pt.

## 2021-05-30 NOTE — ED Triage Notes (Signed)
Pt c/o increased BL LE swelling for the past several months, worse in the past couple of weeks, states he has a couple of wounds to both legs with redness and drainage, pt has coban wraps in place on arrival,.

## 2021-05-30 NOTE — ED Notes (Signed)
Pt to bathroom

## 2021-05-30 NOTE — ED Notes (Signed)
Iv fluids infusing, meds given.

## 2021-05-31 ENCOUNTER — Observation Stay: Payer: Medicare Other

## 2021-05-31 ENCOUNTER — Observation Stay (HOSPITAL_COMMUNITY)
Admit: 2021-05-31 | Discharge: 2021-05-31 | Disposition: A | Discharge status: Home / Self Care | Payer: Medicare Other | Attending: Internal Medicine | Admitting: Internal Medicine

## 2021-05-31 DIAGNOSIS — R0609 Other forms of dyspnea: Secondary | ICD-10-CM

## 2021-05-31 DIAGNOSIS — G63 Polyneuropathy in diseases classified elsewhere: Secondary | ICD-10-CM

## 2021-05-31 DIAGNOSIS — L0390 Cellulitis, unspecified: Secondary | ICD-10-CM | POA: Diagnosis not present

## 2021-05-31 DIAGNOSIS — K219 Gastro-esophageal reflux disease without esophagitis: Secondary | ICD-10-CM | POA: Diagnosis not present

## 2021-05-31 DIAGNOSIS — I872 Venous insufficiency (chronic) (peripheral): Secondary | ICD-10-CM | POA: Diagnosis not present

## 2021-05-31 LAB — CBC
HCT: 43.2 % (ref 39.0–52.0)
Hemoglobin: 14.3 g/dL (ref 13.0–17.0)
MCH: 31.4 pg (ref 26.0–34.0)
MCHC: 33.1 g/dL (ref 30.0–36.0)
MCV: 94.9 fL (ref 80.0–100.0)
Platelets: 199 10*3/uL (ref 150–400)
RBC: 4.55 MIL/uL (ref 4.22–5.81)
RDW: 13.7 % (ref 11.5–15.5)
WBC: 8.7 10*3/uL (ref 4.0–10.5)
nRBC: 0 % (ref 0.0–0.2)

## 2021-05-31 LAB — BASIC METABOLIC PANEL
Anion gap: 8 (ref 5–15)
BUN: 22 mg/dL — ABNORMAL HIGH (ref 6–20)
CO2: 28 mmol/L (ref 22–32)
Calcium: 9.1 mg/dL (ref 8.9–10.3)
Chloride: 101 mmol/L (ref 98–111)
Creatinine, Ser: 1 mg/dL (ref 0.61–1.24)
GFR, Estimated: >60 mL/min (ref >60)
Glucose, Bld: 187 mg/dL — ABNORMAL HIGH (ref 70–99)
Potassium: 4.1 mmol/L (ref 3.5–5.1)
Sodium: 137 mmol/L (ref 135–145)

## 2021-05-31 LAB — ECHOCARDIOGRAM LIMITED
AR max vel: 3.23 cm2
AV Area VTI: 3.22 cm2
AV Area mean vel: 2.69 cm2
AV Mean grad: 6 mmHg
AV Peak grad: 9.2 mmHg
Ao pk vel: 1.52 m/s
Area-P 1/2: 5.38 cm2
Height: 76 in
MV VTI: 4.2 cm2
S' Lateral: 2.74 cm
Weight: 5600 oz

## 2021-05-31 LAB — CBG MONITORING, ED
Glucose-Capillary: 182 mg/dL — ABNORMAL HIGH (ref 70–99)
Glucose-Capillary: 229 mg/dL — ABNORMAL HIGH (ref 70–99)

## 2021-05-31 LAB — HIV ANTIBODY (ROUTINE TESTING W REFLEX): HIV Screen 4th Generation wRfx: NONREACTIVE

## 2021-05-31 LAB — GLUCOSE, CAPILLARY
Glucose-Capillary: 196 mg/dL — ABNORMAL HIGH (ref 70–99)
Glucose-Capillary: 322 mg/dL — ABNORMAL HIGH (ref 70–99)

## 2021-05-31 LAB — PREALBUMIN: Prealbumin: 18.7 mg/dL (ref 18–38)

## 2021-05-31 LAB — SEDIMENTATION RATE: Sed Rate: 44 mm/hr — ABNORMAL HIGH (ref 0–20)

## 2021-05-31 LAB — C-REACTIVE PROTEIN: CRP: 5.3 mg/dL — ABNORMAL HIGH (ref <1.0)

## 2021-05-31 LAB — MAGNESIUM: Magnesium: 2.2 mg/dL (ref 1.7–2.4)

## 2021-05-31 LAB — MRSA NEXT GEN BY PCR, NASAL: MRSA by PCR Next Gen: NOT DETECTED

## 2021-05-31 LAB — D-DIMER, QUANTITATIVE: D-Dimer, Quant: 1.27 ug/mL-FEU — ABNORMAL HIGH (ref 0.00–0.50)

## 2021-05-31 MED ORDER — HYDROCHLOROTHIAZIDE 25 MG PO TABS
25.0000 mg | ORAL_TABLET | Freq: Every day | ORAL | Status: DC
  Administered 2021-05-31: 10:00:00 25 mg via ORAL
  Filled 2021-05-31: qty 1

## 2021-05-31 MED ORDER — LOSARTAN POTASSIUM-HCTZ 100-25 MG PO TABS: 1.0000 | ORAL_TABLET | Freq: Every day | ORAL | Status: DC

## 2021-05-31 MED ORDER — LOSARTAN POTASSIUM 50 MG PO TABS
100.0000 mg | ORAL_TABLET | Freq: Every day | ORAL | Status: DC
  Administered 2021-05-31: 10:00:00 100 mg via ORAL
  Filled 2021-05-31: qty 2

## 2021-05-31 MED ORDER — CLONIDINE HCL 0.1 MG PO TABS
0.2000 mg | ORAL_TABLET | Freq: Three times a day (TID) | ORAL | Status: DC
  Administered 2021-05-31 – 2021-06-01 (×4): 0.2 mg via ORAL
  Filled 2021-05-31 (×4): qty 2

## 2021-05-31 MED ORDER — POLYETHYLENE GLYCOL 3350 17 G PO PACK
17.0000 g | PACK | Freq: Every day | ORAL | Status: DC
  Administered 2021-05-31: 10:00:00 17 g via ORAL
  Filled 2021-05-31: qty 1

## 2021-05-31 MED ORDER — ACETAMINOPHEN 500 MG PO TABS: 1000.0000 mg | ORAL_TABLET | Freq: Four times a day (QID) | ORAL | Status: DC | PRN

## 2021-05-31 MED ORDER — ADULT MULTIVITAMIN W/MINERALS CH
1.0000 | ORAL_TABLET | Freq: Every day | ORAL | Status: DC
  Administered 2021-05-31: 12:00:00 1 via ORAL
  Filled 2021-05-31: qty 1

## 2021-05-31 MED ORDER — PNEUMOCOCCAL VAC POLYVALENT 25 MCG/0.5ML IJ INJ: 0.5000 mL | INJECTION | INTRAMUSCULAR | Status: DC

## 2021-05-31 MED ORDER — JUVEN PO PACK
1.0000 | PACK | Freq: Two times a day (BID) | ORAL | Status: DC
  Administered 2021-05-31 – 2021-06-01 (×2): 1 via ORAL

## 2021-05-31 MED ORDER — HEPARIN SODIUM (PORCINE) 5000 UNIT/ML IJ SOLN
5000.0000 [IU] | Freq: Three times a day (TID) | INTRAMUSCULAR | Status: DC
  Administered 2021-05-31 – 2021-06-01 (×3): 5000 [IU] via SUBCUTANEOUS
  Filled 2021-05-31 (×4): qty 1

## 2021-05-31 MED ORDER — PERFLUTREN LIPID MICROSPHERE
1.0000 mL | INTRAVENOUS | Status: AC | PRN
Start: 2021-05-31 — End: 2021-05-31
  Administered 2021-05-31: 12:00:00 3 mL via INTRAVENOUS
  Filled 2021-05-31: qty 10

## 2021-05-31 MED ORDER — GABAPENTIN 300 MG PO CAPS
300.0000 mg | ORAL_CAPSULE | Freq: Three times a day (TID) | ORAL | Status: DC
  Administered 2021-05-31 – 2021-06-01 (×3): 300 mg via ORAL
  Filled 2021-05-31 (×3): qty 1

## 2021-05-31 MED ORDER — INSULIN GLARGINE-YFGN 100 UNIT/ML ~~LOC~~ SOLN
15.0000 [IU] | Freq: Every day | SUBCUTANEOUS | Status: DC
  Administered 2021-06-01: 10:00:00 15 [IU] via SUBCUTANEOUS
  Filled 2021-05-31 (×2): qty 0.15

## 2021-05-31 MED ORDER — ENSURE MAX PROTEIN PO LIQD
11.0000 [oz_av] | Freq: Every day | ORAL | Status: DC
Start: 2021-05-31 — End: 2021-06-01
  Filled 2021-05-31: qty 330

## 2021-05-31 MED ORDER — BUSPIRONE HCL 15 MG PO TABS
15.0000 mg | ORAL_TABLET | Freq: Three times a day (TID) | ORAL | Status: DC
  Administered 2021-05-31 – 2021-06-01 (×4): 15 mg via ORAL
  Filled 2021-05-31 (×4): qty 1
  Filled 2021-05-31: qty 3
  Filled 2021-05-31: qty 1

## 2021-05-31 MED ORDER — LORATADINE 10 MG PO TABS
10.0000 mg | ORAL_TABLET | Freq: Every day | ORAL | Status: DC
  Administered 2021-05-31: 10:00:00 10 mg via ORAL
  Filled 2021-05-31: qty 1

## 2021-05-31 NOTE — ED Notes (Signed)
Awaiting dressing from supply chain to dress wounds on legs bilat as ordered.

## 2021-05-31 NOTE — Consult Note (Addendum)
PODIATRY / FOOT AND ANKLE SURGERY CONSULTATION NOTE  Requesting Physician: Dr. Ouida Sills  Reason for consult: Bilateral lower extremity swelling, cellulitis, wounds  Chief Complaint: Leg pain and swelling with wounds   HPI: Martin Riddle is a 50 y.o. male who presents with ulcerations to multiple aspects of bilateral lower extremities.  Patient has noted that recently his legs have gotten more swollen and red.  Patient is in extreme pain and is shaking today visibly when I had seen the patient and entered the room.  Patient does have a history of chronic pain.  Patient apparently takes of substantial amount of pain medication as an outpatient but states that he has not taken it for a while now.  Apparently he was taking close to 30 mg of oxycodone every 4 hours.  Patient has history of recurrent leg wounds that occur when his swelling worsens.  Patient apparently was trying to put on compression wraps prior to going to the emergency room but pain worsened so much that he was having trouble sleeping so he went to the emergency room.  Podiatry team was consulted for further evaluation.  Patient currently denies nausea, vomiting, fever, chills.  PMHx:  Past Medical History:  Diagnosis Date   Diabetes mellitus without complication ([place])    Hypercholesteremia    Hypertension    Pancreatitis     Surgical Hx:  Past Surgical History:  Procedure Laterality Date   ANKLE SURGERY Left 2001,2003   BACK SURGERY  [phone]    FHx:  Family History  Problem Relation Age of Onset   Heart disease Mother    Diabetes Mother    Diabetes Sister     Social History:  reports that he has quit smoking. His smokeless tobacco use includes chew. He reports that he does not drink alcohol and does not use drugs.  Allergies:  Allergies  Allergen Reactions   Morphine And Related Other (See Comments)    headache   (Not in a hospital admission)   Physical Exam: General: Alert and oriented.  No apparent  distress.  Vascular: DP/PT pulses difficult to palpate due to body habitus/swelling.  No hair growth noted to digits.  Severe nonpitting edema noted to bilateral lower extremities.  Neuro: Light touch sensation reduced to nearly absent to bilateral lower extremities.  Derm: Right anterior shin ulceration which appears to be a stable type eschar with some areas of fibrous tissue, measures approximately 1.5 cm x 1.5 cm, mild serous drainage, mild associated erythema.    Multiple scattered blisters noted to the anterior lateral aspect of the right ankle.  Large blister formation that appears to be present around the lateral ankle area that extends to the posterior heel.  No areas that probe deep to these areas, appears to be fairly superficial within the dermal epidermal junction.  Corresponding erythema and edema present, minimal serosanguineous drainage, minimal odor.    No wounds noted to the plantar aspect of the right foot but does have a mild skin crease/skin tear noted at the IPJ hallux plantarly of the right foot.  Left foot with large blister formation at the plantar lateral aspect of the foot near the fifth metatarsal phalangeal joint, once the roof measured approximately 2 cm x 1.5 cm by dermis, wound bed appeared to be granular overall and to the level of dermis, mild corresponding erythema.  Mild maceration between fourth and fifth toes left foot.    3 dermal wounds present to the anterior aspect of the left shin.  Proximally measures approximately 3 cm x 2.7 cm by dermis; middle area/midportion of the shin next down measured approximately 2.5 cm x 2 cm by dermis; and more distally measures approximately 1.5 cm x 1.2 cm by dermis    MSK: Pain on palpation to essentially the entire aspects of bilateral lower extremities from toes to below knee.  Results for orders placed or performed during the hospital encounter of 05/30/21 (from the past 48 hour(s))  Lactic acid, plasma      Status: None   Collection Time: 05/30/21 12:43 PM  Result Value Ref Range   Lactic Acid, Venous 1.9 0.5 - 1.9 mmol/L    Comment: Performed at [facility], [address] [place], [place] [address]  Comprehensive metabolic panel     Status: Abnormal   Collection Time: 05/30/21 12:43 PM  Result Value Ref Range   Sodium 136 135 - 145 mmol/L   Potassium 4.5 3.5 - 5.1 mmol/L   Chloride 105 98 - 111 mmol/L   CO2 25 22 - 32 mmol/L   Glucose, Bld 179 (H) 70 - 99 mg/dL    Comment: Glucose reference range applies only to samples taken after fasting for at least 8 hours.   BUN 24 (H) 6 - 20 mg/dL   Creatinine, Ser 0.98 0.61 - 1.24 mg/dL   Calcium 9.1 8.9 - 10.3 mg/dL   Total Protein 7.6 6.5 - 8.1 g/dL   Albumin 4.0 3.5 - 5.0 g/dL   AST 24 15 - 41 U/L   ALT 29 0 - 44 U/L   Alkaline Phosphatase 54 38 - 126 U/L   Total Bilirubin 0.7 0.3 - 1.2 mg/dL   GFR, Estimated >60 >60 mL/min    Comment: (NOTE) Calculated using the CKD-EPI Creatinine Equation (2021)    Anion gap 6 5 - 15    Comment: Performed at [facility], [address] [place], [place] [address]  CBC with Differential     Status: Abnormal   Collection Time: 05/30/21 12:43 PM  Result Value Ref Range   WBC 7.7 4.0 - 10.5 K/uL   RBC 4.34 4.22 - 5.81 MIL/uL   Hemoglobin 13.7 13.0 - 17.0 g/dL   HCT 41.5 39.0 - 52.0 %   MCV 95.6 80.0 - 100.0 fL   MCH 31.6 26.0 - 34.0 pg   MCHC 33.0 30.0 - 36.0 g/dL   RDW 13.5 11.5 - 15.5 %   Platelets 170 150 - 400 K/uL   nRBC 0.0 0.0 - 0.2 %   Neutrophils Relative % 57 %   Neutro Abs 4.5 1.7 - 7.7 K/uL   Lymphocytes Relative 26 %   Lymphs Abs 2.0 0.7 - 4.0 K/uL   Monocytes Relative 11 %   Monocytes Absolute 0.8 0.1 - 1.0 K/uL   Eosinophils Relative 1 %   Eosinophils Absolute 0.0 0.0 - 0.5 K/uL   Basophils Relative 1 %   Basophils Absolute 0.1 0.0 - 0.1 K/uL   Immature Granulocytes 4 %   Abs Immature Granulocytes 0.30 (H) 0.00 - 0.07 K/uL    Comment: Performed at  [facility], [address] [place], [place] [address]  Urinalysis, Routine w reflex microscopic     Status: Abnormal   Collection Time: 05/30/21 12:43 PM  Result Value Ref Range   Color, Urine YELLOW YELLOW   APPearance CLEAR CLEAR   Specific Gravity, Urine 1.025 1.005 - 1.030   pH 5.5 5.0 - 8.0   Glucose, UA 500 (A) NEGATIVE  mg/dL   Hgb urine dipstick NEGATIVE NEGATIVE   Bilirubin Urine NEGATIVE NEGATIVE   Ketones, ur NEGATIVE NEGATIVE mg/dL   Protein, ur NEGATIVE NEGATIVE mg/dL   Nitrite NEGATIVE NEGATIVE   Leukocytes,Ua NEGATIVE NEGATIVE    Comment: Microscopic not done on urines with negative protein, blood, leukocytes, nitrite, or glucose < 500 mg/dL. Performed at [facility], [address] [place], [place] [address]   Brain natriuretic peptide     Status: None   Collection Time: 05/30/21 12:43 PM  Result Value Ref Range   B Natriuretic Peptide 6.1 0.0 - 100.0 pg/mL    Comment: Performed at [facility], [address] [place], [place] [address]  CBG monitoring, ED     Status: Abnormal   Collection Time: 05/30/21  5:23 PM  Result Value Ref Range   Glucose-Capillary 41 (LL) 70 - 99 mg/dL    Comment: Glucose reference range applies only to samples taken after fasting for at least 8 hours.  Resp Panel by RT-PCR (Flu A&B, Covid) Nasopharyngeal Swab     Status: None   Collection Time: 05/30/21  5:30 PM   Specimen: Nasopharyngeal Swab; Nasopharyngeal(NP) swabs in vial transport medium  Result Value Ref Range   SARS Coronavirus 2 by RT PCR NEGATIVE NEGATIVE    Comment: (NOTE) SARS-CoV-2 target nucleic acids are NOT DETECTED.  The SARS-CoV-2 RNA is generally detectable in upper respiratory specimens during the acute phase of infection. The lowest concentration of SARS-CoV-2 viral copies this assay can detect is 138 copies/mL. A negative result does not preclude SARS-Cov-2 infection and should not be used as the sole basis for treatment  or other patient management decisions. A negative result may occur with  improper specimen collection/handling, submission of specimen other than nasopharyngeal swab, presence of viral mutation(s) within the areas targeted by this assay, and inadequate number of viral copies(<138 copies/mL). A negative result must be combined with clinical observations, patient history, and epidemiological information. The expected result is Negative.  Fact Sheet for Patients:  EntrepreneurPulse.com.au  Fact Sheet for Healthcare Providers:  IncredibleEmployment.be  This test is no t yet approved or cleared by the [place] FDA and  has been authorized for detection and/or diagnosis of SARS-CoV-2 by FDA under an Emergency Use Authorization (EUA). This EUA will remain  in effect (meaning this test can be used) for the duration of the COVID-19 declaration under Section 564(b)(1) of the Act, 21 U.S.C.section 360bbb-3(b)(1), unless the authorization is terminated  or revoked sooner.       Influenza A by PCR NEGATIVE NEGATIVE   Influenza B by PCR NEGATIVE NEGATIVE    Comment: (NOTE) The Xpert Xpress SARS-CoV-2/FLU/RSV plus assay is intended as an aid in the diagnosis of influenza from Nasopharyngeal swab specimens and should not be used as a sole basis for treatment. Nasal washings and aspirates are unacceptable for Xpert Xpress SARS-CoV-2/FLU/RSV testing.  Fact Sheet for Patients: EntrepreneurPulse.com.au  Fact Sheet for Healthcare Providers: IncredibleEmployment.be  This test is not yet approved or cleared by the [place] FDA and has been authorized for detection and/or diagnosis of SARS-CoV-2 by FDA under an Emergency Use Authorization (EUA). This EUA will remain in effect (meaning this test can be used) for the duration of the COVID-19 declaration under Section 564(b)(1) of the Act, 21 U.S.C. section  360bbb-3(b)(1), unless the authorization is terminated or revoked.  Performed at [facility], [address], [place], [place] [address]   CBG monitoring, ED  Status: None   Collection Time: 05/30/21  6:09 PM  Result Value Ref Range   Glucose-Capillary 96 70 - 99 mg/dL    Comment: Glucose reference range applies only to samples taken after fasting for at least 8 hours.  MRSA Next Gen by PCR, Nasal     Status: None   Collection Time: 05/31/21  7:00 AM   Specimen: Nasal Mucosa; Nasal Swab  Result Value Ref Range   MRSA by PCR Next Gen NOT DETECTED NOT DETECTED    Comment: (NOTE) The GeneXpert MRSA Assay (FDA approved for NASAL specimens only), is one component of a comprehensive MRSA colonization surveillance program. It is not intended to diagnose MRSA infection nor to guide or monitor treatment for MRSA infections. Test performance is not FDA approved in patients less than 57 years old. Performed at [facility], [address] [place], [place] [address]   CBG monitoring, ED     Status: Abnormal   Collection Time: 05/31/21  7:35 AM  Result Value Ref Range   Glucose-Capillary 182 (H) 70 - 99 mg/dL    Comment: Glucose reference range applies only to samples taken after fasting for at least 8 hours.  CBC     Status: None   Collection Time: 05/31/21  7:43 AM  Result Value Ref Range   WBC 8.7 4.0 - 10.5 K/uL   RBC 4.55 4.22 - 5.81 MIL/uL   Hemoglobin 14.3 13.0 - 17.0 g/dL   HCT 43.2 39.0 - 52.0 %   MCV 94.9 80.0 - 100.0 fL   MCH 31.4 26.0 - 34.0 pg   MCHC 33.1 30.0 - 36.0 g/dL   RDW 13.7 11.5 - 15.5 %   Platelets 199 150 - 400 K/uL   nRBC 0.0 0.0 - 0.2 %    Comment: Performed at [facility], [address] [place], [place] [id number]  Basic metabolic panel     Status: Abnormal   Collection Time: 05/31/21  7:43 AM  Result Value Ref Range   Sodium 137 135 - 145 mmol/L   Potassium 4.1 3.5 - 5.1 mmol/L   Chloride 101 98 - 111 mmol/L   CO2  28 22 - 32 mmol/L   Glucose, Bld 187 (H) 70 - 99 mg/dL    Comment: Glucose reference range applies only to samples taken after fasting for at least 8 hours.   BUN 22 (H) 6 - 20 mg/dL   Creatinine, Ser 1.00 0.61 - 1.24 mg/dL   Calcium 9.1 8.9 - 10.3 mg/dL   GFR, Estimated >60 >60 mL/min    Comment: (NOTE) Calculated using the CKD-EPI Creatinine Equation (2021)    Anion gap 8 5 - 15    Comment: Performed at [facility], [place] [address] [place], [place] [address]  Magnesium     Status: None   Collection Time: 05/31/21  7:43 AM  Result Value Ref Range   Magnesium 2.2 1.7 - 2.4 mg/dL    Comment: Performed at [facility], [address] [place], [place] [address]  C-reactive protein     Status: Abnormal   Collection Time: 05/31/21  7:43 AM  Result Value Ref Range   CRP 5.3 (H) <1.0 mg/dL    Comment: Performed at [facility] [address] [place], [place] [address]  Prealbumin     Status: None   Collection Time: 05/31/21  7:43 AM  Result Value Ref Range   Prealbumin 18.7 18 - 38 mg/dL  Comment: Performed at [facility], [place] [address] [place], [place] [address]  D-dimer, quantitative     Status: Abnormal   Collection Time: 05/31/21  7:43 AM  Result Value Ref Range   D-Dimer, Quant 1.27 (H) 0.00 - 0.50 ug/mL-FEU    Comment: (NOTE) At the manufacturer cut-off value of 0.5 g/mL FEU, this assay has a negative predictive value of 95-100%.This assay is intended for use in conjunction with a clinical pretest probability (PTP) assessment model to exclude pulmonary embolism (PE) and deep venous thrombosis (DVT) in outpatients suspected of PE or DVT. Results should be correlated with clinical presentation. Performed at [facility], [address] [place], [place] [address]   Sedimentation rate     Status: Abnormal   Collection Time: 05/31/21  7:43 AM  Result Value Ref Range   Sed Rate 44 (H) 0 - 20 mm/hr    Comment: Performed at  [facility], [address] [place],  [address]  CBG monitoring, ED     Status: Abnormal   Collection Time: 05/31/21 12:16 PM  Result Value Ref Range   Glucose-Capillary 229 (H) 70 - 99 mg/dL    Comment: Glucose reference range applies only to samples taken after fasting for at least 8 hours.   DG Chest 2 View  Result Date: 05/30/2021 CLINICAL DATA:  A 50 year old male presents for evaluation of lower extremity edema for the past several months. EXAM: CHEST - 2 VIEW COMPARISON:  February 27, 2018. FINDINGS: Trachea midline. Cardiomediastinal contours and hilar structures are normal. Linear areas of airspace disease at the LEFT lung base overlying the LEFT hemidiaphragm. No signs of lobar consolidation. The no pleural effusion. Spinal nerve stimulator projects over the lower thoracic spine. No acute skeletal process on limited assessment. IMPRESSION: Linear areas of airspace disease at the LEFT lung base, findings may represent atelectasis or scarring. No signs of lobar consolidation or pleural effusion. Electronically Signed   By: Zetta Bills M.D.   On: 05/30/2021 13:07   CT TIBIA FIBULA LEFT W CONTRAST  Result Date: 05/30/2021 CLINICAL DATA:  Left lower extremity swelling for the past several months. EXAM: CT OF THE LOWER LEFT EXTREMITY WITH CONTRAST CT OF THE LOWER LEFT FOOT WITH CONTRAST TECHNIQUE: Multidetector CT imaging of the lower left extremity was performed according to the standard protocol following intravenous contrast administration. Multidetector CT imaging of the left foot was performed according to the standard protocol following intravenous contrast administration. CONTRAST:  1106mL OMNIPAQUE IOHEXOL 300 MG/ML  SOLN COMPARISON:  None. FINDINGS: Bones/Joint/Cartilage No fracture or dislocation. Normal alignment. No joint effusion. No periosteal reaction or bone destruction. Small erosion along the medial aspect of the first metatarsal head as can be seen with a  crystalline arthropathy such as gout. Prior left subtalar arthrodesis with solid osseous fusion across the subtalar joint. Severe joint space narrowing of the medial aspect of the left tibiotalar joint with mild subchondral reactive marrow changes. Subcortical sclerosis and cystic changes involving the distal fibula and lateral talus as can be seen with calcaneofibular impingement Ligaments Ligaments are suboptimally evaluated by CT. Muscles and Tendons Muscles are normal. No muscle atrophy. Flexor, extensor, peroneal and Achilles tendons are grossly intact. Soft tissue No fluid collection or hematoma. No soft tissue mass. Mild generalized soft tissue edema within the subcutaneous fat with mild skin thickening throughout bilateral lower legs and feet which may be reactive secondary to venous insufficiency or lymphedema versus less likely cellulitis. IMPRESSION: 1. Mild generalized  soft tissue edema within the subcutaneous fat with mild skin thickening throughout bilateral lower legs and feet which may be reactive secondary to venous insufficiency or lymphedema versus less likely cellulitis. 2. No evidence of osteomyelitis of the left foot and left lower leg. 3. Severe left medial tibiotalar joint space narrowing. 4. Subcortical sclerosis and cystic changes involving the distal fibula and lateral talus as can be seen with calcaneofibular impingement Electronically Signed   By: Kathreen Devoid M.D.   On: 05/30/2021 21:33   CT FOOT RIGHT W CONTRAST  Result Date: 05/30/2021 CLINICAL DATA:  Foot swelling, diabetic, osteomyelitis suspected, xray done; Osteomyelitis suspected, tib/fib, no prior imaging EXAM: CT OF THE LOWER RIGHT EXTREMITY WITH CONTRAST TECHNIQUE: Multidetector CT imaging of the lower right extremity was performed according to the standard protocol following intravenous contrast administration. CONTRAST:  145mL OMNIPAQUE IOHEXOL 300 MG/ML  SOLN COMPARISON:  X-ray right foot 05/30/2021 FINDINGS: RIGHT  bones/Joint/Cartilage No cortical erosion or destruction. No acute displaced fracture. No dislocation. Mild degenerative changes of the right knee. No severe degenerative changes. No severe arthropathy. Ligaments Suboptimally assessed by CT. Muscles and Tendons Grossly unremarkable. Soft tissues Similar-appearing 24mm thin linear density along within the plantar midfoot soft tissue that may represent a retained radiopaque foreign body. Mild dorsal foot subcutaneus soft tissue edema as well as diffuse mild subcutaneus soft tissue edema of the lower visualized extremity. No organized fluid collection.  No hematoma formation. Vasculature: Grossly unremarkable. IMPRESSION: 1. No CT evidence of osteomyelitis of the RIGHT tibia fibula and bones of the RIGHT foot. 2. Similar-appearing 45mm thin linear density along within the right plantar midfoot soft tissue that may represent a retained radiopaque foreign body. 3. Nonspecific diffuse mild subcutaneus soft tissue edema. 4. Please see separately dictated CT tibia fibula and foot of the left tibia fibula and foot 05/30/2021. Electronically Signed   By: Iven Finn M.D.   On: 05/30/2021 21:29   CT FOOT LEFT W CONTRAST  Result Date: 05/30/2021 CLINICAL DATA:  Left lower extremity swelling for the past several months. EXAM: CT OF THE LOWER LEFT EXTREMITY WITH CONTRAST CT OF THE LOWER LEFT FOOT WITH CONTRAST TECHNIQUE: Multidetector CT imaging of the lower left extremity was performed according to the standard protocol following intravenous contrast administration. Multidetector CT imaging of the left foot was performed according to the standard protocol following intravenous contrast administration. CONTRAST:  153mL OMNIPAQUE IOHEXOL 300 MG/ML  SOLN COMPARISON:  None. FINDINGS: Bones/Joint/Cartilage No fracture or dislocation. Normal alignment. No joint effusion. No periosteal reaction or bone destruction. Small erosion along the medial aspect of the first metatarsal  head as can be seen with a crystalline arthropathy such as gout. Prior left subtalar arthrodesis with solid osseous fusion across the subtalar joint. Severe joint space narrowing of the medial aspect of the left tibiotalar joint with mild subchondral reactive marrow changes. Subcortical sclerosis and cystic changes involving the distal fibula and lateral talus as can be seen with calcaneofibular impingement Ligaments Ligaments are suboptimally evaluated by CT. Muscles and Tendons Muscles are normal. No muscle atrophy. Flexor, extensor, peroneal and Achilles tendons are grossly intact. Soft tissue No fluid collection or hematoma. No soft tissue mass. Mild generalized soft tissue edema within the subcutaneous fat with mild skin thickening throughout bilateral lower legs and feet which may be reactive secondary to venous insufficiency or lymphedema versus less likely cellulitis. IMPRESSION: 1. Mild generalized soft tissue edema within the subcutaneous fat with mild skin thickening throughout bilateral lower legs and feet  which may be reactive secondary to venous insufficiency or lymphedema versus less likely cellulitis. 2. No evidence of osteomyelitis of the left foot and left lower leg. 3. Severe left medial tibiotalar joint space narrowing. 4. Subcortical sclerosis and cystic changes involving the distal fibula and lateral talus as can be seen with calcaneofibular impingement Electronically Signed   By: Elige Ko M.D.   On: 05/30/2021 21:33   CT TIBIA FIBULA RIGHT W CONTRAST  Result Date: 05/30/2021 CLINICAL DATA:  Foot swelling, diabetic, osteomyelitis suspected, xray done; Osteomyelitis suspected, tib/fib, no prior imaging EXAM: CT OF THE LOWER RIGHT EXTREMITY WITH CONTRAST TECHNIQUE: Multidetector CT imaging of the lower right extremity was performed according to the standard protocol following intravenous contrast administration. CONTRAST:  OMNIPAQUE IOHEXOL 300 MG/ML  SOLN COMPARISON:  X-ray right  foot 05/30/2021 FINDINGS: RIGHT bones/Joint/Cartilage No cortical erosion or destruction. No acute displaced fracture. No dislocation. Mild degenerative changes of the right knee. No severe degenerative changes. No severe arthropathy. Ligaments Suboptimally assessed by CT. Muscles and Tendons Grossly unremarkable. Soft tissues Similar-appearing 67mm thin linear density along within the plantar midfoot soft tissue that may represent a retained radiopaque foreign body. Mild dorsal foot subcutaneus soft tissue edema as well as diffuse mild subcutaneus soft tissue edema of the lower visualized extremity. No organized fluid collection.  No hematoma formation. Vasculature: Grossly unremarkable. IMPRESSION: 1. No CT evidence of osteomyelitis of the RIGHT tibia fibula and bones of the RIGHT foot. 2. Similar-appearing 64mm thin linear density along within the right plantar midfoot soft tissue that may represent a retained radiopaque foreign body. 3. Nonspecific diffuse mild subcutaneus soft tissue edema. 4. Please see separately dictated CT tibia fibula and foot of the left tibia fibula and foot 05/30/2021. Electronically Signed   By: Tish Frederickson M.D.   On: 05/30/2021 21:29   US ARTERIAL ABI (SCREENING LOWER EXTREMITY)  Result Date: 05/31/2021 CLINICAL DATA:  Bilateral lower leg ulcers. Bilateral lower extremity rest pain. Right claudication. Diabetes, hypertension, hyperlipidemia, obesity, history of tobacco abuse. EXAM: NONINVASIVE PHYSIOLOGIC VASCULAR STUDY OF BILATERAL LOWER EXTREMITIES TECHNIQUE: Evaluation of both lower extremities were performed at rest, including calculation of ankle-brachial indices with single level Doppler, pressure and pulse volume recording. COMPARISON:  None. FINDINGS: Right ABI:  1.15 Left ABI:  1.02 Right Lower Extremity:  Normal arterial waveforms at the ankle. Left Lower Extremity:  Normal arterial waveforms at the ankle. IMPRESSION: No evidence of hemodynamically significant lower  extremity arterial occlusive disease at rest. Electronically Signed   By: Corlis Leak M.D.   On: 05/31/2021 10:11   DG Foot Complete Right  Result Date: 05/30/2021 CLINICAL DATA:  pain, concern for infection EXAM: RIGHT FOOT COMPLETE - 3+ VIEW COMPARISON:  None. FINDINGS: No evidence of fracture, dislocation, or joint effusion. No cortical erosion or destruction. No evidence of severe arthropathy. No aggressive appearing focal bone abnormality. Diffuse dorsal subcutaneus soft tissue edema. 35mm thin linear density along the plantar aspect of the midfoot may represent a retained radiopaque foreign body. IMPRESSION: 1. No radiographic finding to suggest osteomyelitis. 2. No acute displaced fracture or dislocation. 3.  Diffuse dorsal subcutaneus soft tissue edema. 4. 65mm thin linear density along the plantar aspect of the midfoot may represent a retained radiopaque foreign body. Electronically Signed   By: Tish Frederickson M.D.   On: 05/30/2021 17:05   ECHOCARDIOGRAM LIMITED  Result Date: 05/31/2021    ECHOCARDIOGRAM REPORT   Patient Name:   BRONSEN SERANO Date of Exam: 05/31/2021 Medical  Rec #:  [id number]          Height:       76.0 in Accession #:    [id number]         Weight:       350.0 lb Date of Birth:  [birth year]           BSA:          2.810 m Patient Age:    50 years           BP:           144/75 mmHg Patient Gender: M                  HR:           97 bpm. Exam Location:  [facility] Procedure: 2D Echo, Color Doppler, Cardiac Doppler and Intracardiac            Opacification Agent Indications:     R06.00 Dyspnea  History:         Patient has prior history of Echocardiogram examinations. Risk                  Factors:Hypertension, Diabetes and HCL.  Sonographer:     Charmayne Sheer Referring Phys:  [id number] ERIC J [place] Diagnosing Phys: Ida Rogue MD  Sonographer Comments: No apical window and no subcostal window. Image acquisition challenging due to patient body habitus. IMPRESSIONS  1. Left ventricular  ejection fraction, by estimation, is 60 to 65%. The left ventricle has normal function. The left ventricle has no regional wall motion abnormalities. There is mild left ventricular hypertrophy. Left ventricular diastolic parameters are consistent with Grade I diastolic dysfunction (impaired relaxation).  2. Right ventricular systolic function is normal. The right ventricular size is normal. Tricuspid regurgitation signal is inadequate for assessing PA pressure.  3. The mitral valve is normal in structure. No evidence of mitral valve regurgitation. No evidence of mitral stenosis.  4. The aortic valve was not well visualized. Aortic valve regurgitation is not visualized. Aortic valve sclerosis is present, with no evidence of aortic valve stenosis.  5. The inferior vena cava is normal in size with greater than 50% respiratory variability, suggesting right atrial pressure of 3 mmHg. FINDINGS  Left Ventricle: Left ventricular ejection fraction, by estimation, is 60 to 65%. The left ventricle has normal function. The left ventricle has no regional wall motion abnormalities. Definity contrast agent was given IV to delineate the left ventricular  endocardial borders. The left ventricular internal cavity size was normal in size. There is mild left ventricular hypertrophy. Left ventricular diastolic parameters are consistent with Grade I diastolic dysfunction (impaired relaxation). Right Ventricle: The right ventricular size is normal. No increase in right ventricular wall thickness. Right ventricular systolic function is normal. Tricuspid regurgitation signal is inadequate for assessing PA pressure. Left Atrium: Left atrial size was normal in size. Right Atrium: Right atrial size was normal in size. Pericardium: There is no evidence of pericardial effusion. Mitral Valve: The mitral valve is normal in structure. No evidence of mitral valve regurgitation. No evidence of mitral valve stenosis. MV peak gradient, 3.4 mmHg. The  mean mitral valve gradient is 2.0 mmHg. Tricuspid Valve: The tricuspid valve is normal in structure. Tricuspid valve regurgitation is not demonstrated. No evidence of tricuspid stenosis. Aortic Valve: The aortic valve was not well visualized. Aortic valve regurgitation is not visualized. Aortic valve sclerosis is present, with no evidence of aortic valve stenosis. Aortic valve mean gradient  measures 6.0 mmHg. Aortic valve peak gradient measures 9.2 mmHg. Aortic valve area, by VTI measures 3.22 cm. Pulmonic Valve: The pulmonic valve was normal in structure. Pulmonic valve regurgitation is not visualized. No evidence of pulmonic stenosis. Aorta: The aortic root is normal in size and structure. Venous: The inferior vena cava is normal in size with greater than 50% respiratory variability, suggesting right atrial pressure of 3 mmHg. IAS/Shunts: No atrial level shunt detected by color flow Doppler.  LEFT VENTRICLE PLAX 2D LVIDd:         4.62 cm LVIDs:         2.74 cm LV PW:         1.43 cm LV IVS:        1.37 cm LVOT diam:     2.30 cm LV SV:         69 LV SV Index:   25 LVOT Area:     4.15 cm  LEFT ATRIUM           Index LA diam:      3.60 cm 1.28 cm/m LA Vol (A4C): 53.7 ml 19.11 ml/m  AORTIC VALVE                     PULMONIC VALVE AV Area (Vmax):    3.23 cm      PV Vmax:       1.17 m/s AV Area (Vmean):   2.69 cm      PV Vmean:      80.600 cm/s AV Area (VTI):     3.22 cm      PV VTI:        0.213 m AV Vmax:           152.00 cm/s   PV Peak grad:  5.5 mmHg AV Vmean:          112.000 cm/s  PV Mean grad:  3.0 mmHg AV VTI:            0.214 m AV Peak Grad:      9.2 mmHg AV Mean Grad:      6.0 mmHg LVOT Vmax:         118.00 cm/s LVOT Vmean:        72.500 cm/s LVOT VTI:          0.166 m LVOT/AV VTI ratio: 0.78  AORTA Ao Root diam: 3.80 cm MITRAL VALVE MV Area (PHT): 5.38 cm    SHUNTS MV Area VTI:   4.20 cm    Systemic VTI:  0.17 m MV Peak grad:  3.4 mmHg    Systemic Diam: 2.30 cm MV Mean grad:  2.0 mmHg MV Vmax:        0.92 m/s MV Vmean:      58.9 cm/s MV Decel Time: 141 msec MV E velocity: 65.60 cm/s MV A velocity: 98.60 cm/s MV E/A ratio:  0.67 Ida Rogue MD Electronically signed by Ida Rogue MD Signature Date/Time: 05/31/2021/12:57:49 PM    Final     Blood pressure (!) 144/51, pulse (!) 102, temperature 98.2 F (36.8 C), temperature source Oral, resp. rate 18, height 6\' 4"  (1.93 m), weight (!) 158.8 kg, SpO2 92 %.  Assessment Venous/lymphedema wounds present to bilateral lower extremities with corresponding cellulitis Venous insufficiency/lymphedema Diabetes type 2 polyneuropathy Chronic pain syndrome  Plan -Patient seen and examined. -X-ray imaging as well as CT scans reviewed.  Appears to show a foreign body present to the right foot at the midfoot near  the second metatarsal midshaft plantarly within the plantar musculature.  No other abnormalities found.  Patient previously had subtalar joint fusion on the left side with screws intact. -ABIs reviewed, appears to be within normal limits.  Patient appears to have biphasic to triphasic signaling with ABIs around 1 which appears to be once again within normal limits. -Vascular consulted due to extreme severe bilateral lower extremity swelling and venous congestion with likely lymphedema.  Patient will require likely compression pumps in an outpatient setting. -Multiple wounds present bilateral lower extremities. -Multiple selective dermal wound debridements performed to wounds to both feet and lower legs performed with 15 blade and scissors without incident.  Patient tolerated well.  Patient did have pain when doing so.  Did not remove all of the dead skin present to the right lateral ankle/heel area as this would likely expose more structures and potentially increasing sources of infection. -Applied Xeroform to the right lateral foot and ankle wounds followed by silver alginate to all wounds, 4 x 4 gauze, ABD, Kerlix, Ace wrap with moderate  compression from forefoot to below knee.  Agree with wound care recommend dressing changes daily for the next week and cleansing the wounds with normal sterile saline prior to applying.  After that time then would recommend usage of Unna boots and be changed twice weekly. Will discuss with wound care team.  Patient will need follow-up at the wound care center for this. -Appreciate medicine recommendations for antibiotic therapy. -Believe that pain management will likely be the biggest issue with this patient.  Appreciate medicine recommendations. -Patient can follow-up at the wound care center for further treatment of his wounds.  Do not believe surgical intervention is indicated at this time as patient's white blood cell count is normal, wounds also cleansed today further.  Believe that compression dressings will likely improve his situation along with antibiotic therapy alone.  Could consider surgical intervention in the future if not improving consisting of multiple wound debridements.  Podiatry team to monitor more peripherally at this time.  Discharge instructions placed in chart.  Caroline More, DPM 05/31/2021, 1:03 PM

## 2021-05-31 NOTE — Progress Notes (Signed)
PROGRESS NOTE  Marx Riddle    DOB: [birth year], 50 y.o.  AUQ:[id number]  PCP: Lauro Regulus, MD   Code Status: Full Code   DOA: 05/30/2021   LOS: 0  Brief Narrative of Current Hospitalization  Martin Riddle is a 50 y.o. male with a PMH significant for type 2 diabetes mellitus, essential hypertension, GERD, hyperlipidemia, peripheral neuropathy, chronic pain syndrome s/p spinal cord stimulator. They presented from home to the ED on 05/30/2021 with bilateral lower extremity swelling and pain chronically but acutely worsened x1 days. In the ED, it was found that they had no DVT, no signs of osteomyelitis. There are signs of possible LE cellulitis but more consistent with vascular congestion changes on CT. They were treated with analgesia, leg wraps. Podiatry was consulted. Patient was admitted to medicine service for further workup and management of LE pain as outlined in detail below.  05/31/21 -stable, unchanged  Assessment & Plan  Principal Problem:   Cellulitis Active Problems:   Diabetes mellitus, type 2 (HCC)   Hypercholesteremia   Gastro-esophageal reflux disease without esophagitis   Peripheral neuropathy   Multiple open wounds of lower extremity, initial encounter  LE chronic venous stasis with concurrent mild cellulitis. Circulation in [place] is limited as evidenced by poor pedal pulses. Will need surgical evaluation.  - podiatry following, appreciate recs - analgesia PRN - WOC - unna boots - PT/OT - follow up ABIs - continue empiric Abx  Essential hypertension   tachycardia mild, asymptomatic- likely related to acute pain. - continue home losartan and HCTZ   Type 2 diabetes mellitus - sSSI - semglee 15 units   Depression/anxiety- chronic, stable. Continue home meds Peripheral neuropathy --Cymbalta 60 mg p.o. twice daily --Buspirone 15 mg p.o. 3 times daily - added gabapentin 300mg  TID   Chronic pain syndrome S/p spinal cord  stimulator. --Oxycodone 5 mg p.o. every 4 hours as needed moderate pain --Fentanyl 25 mcg IV every 3 hours as needed severe pain   GERD: --Protonix 40 mg p.o. daily   Hyperlipidemia: Atorvastatin 20 mg p.o. daily   Morbid obesity Body mass index is 42.6 kg/m.  Discussed with patient needs for aggressive lifestyle changes/weight loss as this complicates all facets of care.  Outpatient follow-up with PCP.  May benefit from bariatric evaluation outpatient.  DVT prophylaxis: changing enoxaparin to heparin for surgical procedure possibility  Diet:  Diet Orders (From admission, onward)     Start     Ordered   05/30/21 2009  Diet heart healthy/carb modified Room service appropriate? Yes; Fluid consistency: Thin  Diet effective now       Question Answer Comment  Diet-HS Snack? Nothing   Room service appropriate? Yes   Fluid consistency: Thin      05/30/21 2008            Subjective 05/31/21    Pt reports continued pain in lower extremities. Denies fever, chest pain, SOB.   Disposition Plan & Communication  Patient status: Observation  Admitted From: Home Disposition: Home Anticipated discharge date: TBD  Family Communication: none at bedside  Consults, Procedures, Significant Events  Consultants:  podiatry  Procedures/significant events:  None  Antimicrobials:  Anti-infectives (From admission, onward)    Start     Dose/Rate Route Frequency Ordered Stop   05/30/21 2200  metroNIDAZOLE (FLAGYL) tablet 500 mg       See Hyperspace for full Linked Orders Report.   500 mg Oral Every 8 hours 05/30/21 2008 06/06/21 2159  05/30/21 2008  cefTRIAXone (ROCEPHIN) 2 g in sodium chloride 0.9 % 100 mL IVPB       See Hyperspace for full Linked Orders Report.   2 g 200 mL/hr over 30 Minutes Intravenous Every 24 hours 05/30/21 2008 06/06/21 2014   05/30/21 1730  vancomycin (VANCOCIN) IVPB 1000 mg/200 mL premix        1,000 mg 200 mL/hr over 60 Minutes Intravenous  Once 05/30/21  1721 05/30/21 1826       Objective   Vitals:   05/31/21 0612 05/31/21 0630 05/31/21 0700 05/31/21 0711  BP: (!) 161/91 (!) 150/131 (!) 143/101   Pulse: 100 (!) 106 (!) 104 (!) 103  Resp: 19     Temp:      TempSrc:      SpO2: 93% 96% 94% 94%  Weight:      Height:        Intake/Output Summary (Last 24 hours) at 05/31/2021 0726 Last data filed at 05/31/2021 0502 Gross per 24 hour  Intake 800 ml  Output 2150 ml  Net -1350 ml   Filed Weights   05/30/21 1241  Weight: (!) 158.8 kg    Patient BMI: Body mass index is 42.6 kg/m.   Physical Exam:  General: awake, alert, NAD HEENT: atraumatic, clear conjunctiva, anicteric sclera, MMM, hearing grossly normal Respiratory: normal respiratory effort. Cardiovascular: normal S1/S2, RRR, no JVD, murmurs. Diminished pedal PT pulses Nervous: A&O x3. no gross focal neurologic deficits, normal speech Extremities: positive LE edema bilaterally. Legs wrapped currently but reviewed wounds in clinical images in chart.  Psychiatry: anxious mood, congruent affect  Labs   I have personally reviewed following labs and imaging studies Admission on 05/30/2021  Component Date Value Ref Range Status   Lactic Acid, Venous 05/30/2021 1.9  0.5 - 1.9 mmol/L Final   Sodium 05/30/2021 136  135 - 145 mmol/L Final   Potassium 05/30/2021 4.5  3.5 - 5.1 mmol/L Final   Chloride 05/30/2021 105  98 - 111 mmol/L Final   CO2 05/30/2021 25  22 - 32 mmol/L Final   Glucose, Bld 05/30/2021 179 (H)  70 - 99 mg/dL Final   BUN 16/03/9603 24 (H)  6 - 20 mg/dL Final   Creatinine, Ser 05/30/2021 0.98  0.61 - 1.24 mg/dL Final   Calcium 54/02/8118 9.1  8.9 - 10.3 mg/dL Final   Total Protein 14/78/2956 7.6  6.5 - 8.1 g/dL Final   Albumin 21/30/8657 4.0  3.5 - 5.0 g/dL Final   AST 84/69/6295 24  15 - 41 U/L Final   ALT 05/30/2021 29  0 - 44 U/L Final   Alkaline Phosphatase 05/30/2021 54  38 - 126 U/L Final   Total Bilirubin 05/30/2021 0.7  0.3 - 1.2 mg/dL Final   GFR,  Estimated 05/30/2021 >60  >60 mL/min Final   Anion gap 05/30/2021 6  5 - 15 Final   WBC 05/30/2021 7.7  4.0 - 10.5 K/uL Final   RBC 05/30/2021 4.34  4.22 - 5.81 MIL/uL Final   Hemoglobin 05/30/2021 13.7  13.0 - 17.0 g/dL Final   HCT 28/41/3244 41.5  39.0 - 52.0 % Final   MCV 05/30/2021 95.6  80.0 - 100.0 fL Final   MCH 05/30/2021 31.6  26.0 - 34.0 pg Final   MCHC 05/30/2021 33.0  30.0 - 36.0 g/dL Final   RDW 06/11/7251 13.5  11.5 - 15.5 % Final   Platelets 05/30/2021 170  150 - 400 K/uL Final   nRBC 05/30/2021 0.0  0.0 - 0.2 % Final   Neutrophils Relative % 05/30/2021 57  % Final   Neutro Abs 05/30/2021 4.5  1.7 - 7.7 K/uL Final   Lymphocytes Relative 05/30/2021 26  % Final   Lymphs Abs 05/30/2021 2.0  0.7 - 4.0 K/uL Final   Monocytes Relative 05/30/2021 11  % Final   Monocytes Absolute 05/30/2021 0.8  0.1 - 1.0 K/uL Final   Eosinophils Relative 05/30/2021 1  % Final   Eosinophils Absolute 05/30/2021 0.0  0.0 - 0.5 K/uL Final   Basophils Relative 05/30/2021 1  % Final   Basophils Absolute 05/30/2021 0.1  0.0 - 0.1 K/uL Final   Immature Granulocytes 05/30/2021 4  % Final   Abs Immature Granulocytes 05/30/2021 0.30 (H)  0.00 - 0.07 K/uL Final   Color, Urine 05/30/2021 YELLOW  YELLOW Final   APPearance 05/30/2021 CLEAR  CLEAR Final   Specific Gravity, Urine 05/30/2021 1.025  1.005 - 1.030 Final   pH 05/30/2021 5.5  5.0 - 8.0 Final   Glucose, UA 05/30/2021 500 (A)  NEGATIVE mg/dL Final   Hgb urine dipstick 05/30/2021 NEGATIVE  NEGATIVE Final   Bilirubin Urine 05/30/2021 NEGATIVE  NEGATIVE Final   Ketones, ur 05/30/2021 NEGATIVE  NEGATIVE mg/dL Final   Protein, ur 71/24/5809 NEGATIVE  NEGATIVE mg/dL Final   Nitrite 98/33/8250 NEGATIVE  NEGATIVE Final   Leukocytes,Ua 05/30/2021 NEGATIVE  NEGATIVE Final   B Natriuretic Peptide 05/30/2021 6.1  0.0 - 100.0 pg/mL Final   SARS Coronavirus 2 by RT PCR 05/30/2021 NEGATIVE  NEGATIVE Final   Influenza A by PCR 05/30/2021 NEGATIVE  NEGATIVE  Final   Influenza B by PCR 05/30/2021 NEGATIVE  NEGATIVE Final   Glucose-Capillary 05/30/2021 41 (LL)  70 - 99 mg/dL Final   Glucose-Capillary 05/30/2021 96  70 - 99 mg/dL Final    Imaging Studies  DG Chest 2 View  Result Date: 05/30/2021 CLINICAL DATA:  A 50 year old male presents for evaluation of lower extremity edema for the past several months. EXAM: CHEST - 2 VIEW COMPARISON:  February 27, 2018. FINDINGS: Trachea midline. Cardiomediastinal contours and hilar structures are normal. Linear areas of airspace disease at the LEFT lung base overlying the LEFT hemidiaphragm. No signs of lobar consolidation. The no pleural effusion. Spinal nerve stimulator projects over the lower thoracic spine. No acute skeletal process on limited assessment. IMPRESSION: Linear areas of airspace disease at the LEFT lung base, findings may represent atelectasis or scarring. No signs of lobar consolidation or pleural effusion. Electronically Signed   By: Donzetta Kohut M.D.   On: 05/30/2021 13:07   CT TIBIA FIBULA LEFT W CONTRAST  Result Date: 05/30/2021 CLINICAL DATA:  Left lower extremity swelling for the past several months. EXAM: CT OF THE LOWER LEFT EXTREMITY WITH CONTRAST CT OF THE LOWER LEFT FOOT WITH CONTRAST TECHNIQUE: Multidetector CT imaging of the lower left extremity was performed according to the standard protocol following intravenous contrast administration. Multidetector CT imaging of the left foot was performed according to the standard protocol following intravenous contrast administration. CONTRAST:  OMNIPAQUE IOHEXOL 300 MG/ML  SOLN COMPARISON:  None. FINDINGS: Bones/Joint/Cartilage No fracture or dislocation. Normal alignment. No joint effusion. No periosteal reaction or bone destruction. Small erosion along the medial aspect of the first metatarsal head as can be seen with a crystalline arthropathy such as gout. Prior left subtalar arthrodesis with solid osseous fusion across the subtalar  joint. Severe joint space narrowing of the medial aspect of the left tibiotalar joint with mild subchondral  reactive marrow changes. Subcortical sclerosis and cystic changes involving the distal fibula and lateral talus as can be seen with calcaneofibular impingement Ligaments Ligaments are suboptimally evaluated by CT. Muscles and Tendons Muscles are normal. No muscle atrophy. Flexor, extensor, peroneal and Achilles tendons are grossly intact. Soft tissue No fluid collection or hematoma. No soft tissue mass. Mild generalized soft tissue edema within the subcutaneous fat with mild skin thickening throughout bilateral lower legs and feet which may be reactive secondary to venous insufficiency or lymphedema versus less likely cellulitis. IMPRESSION: 1. Mild generalized soft tissue edema within the subcutaneous fat with mild skin thickening throughout bilateral lower legs and feet which may be reactive secondary to venous insufficiency or lymphedema versus less likely cellulitis. 2. No evidence of osteomyelitis of the left foot and left lower leg. 3. Severe left medial tibiotalar joint space narrowing. 4. Subcortical sclerosis and cystic changes involving the distal fibula and lateral talus as can be seen with calcaneofibular impingement Electronically Signed   By: Elige Ko M.D.   On: 05/30/2021 21:33   CT FOOT RIGHT W CONTRAST  Result Date: 05/30/2021 CLINICAL DATA:  Foot swelling, diabetic, osteomyelitis suspected, xray done; Osteomyelitis suspected, tib/fib, no prior imaging EXAM: CT OF THE LOWER RIGHT EXTREMITY WITH CONTRAST TECHNIQUE: Multidetector CT imaging of the lower right extremity was performed according to the standard protocol following intravenous contrast administration. CONTRAST:  OMNIPAQUE IOHEXOL 300 MG/ML  SOLN COMPARISON:  X-ray right foot 05/30/2021 FINDINGS: RIGHT bones/Joint/Cartilage No cortical erosion or destruction. No acute displaced fracture. No dislocation. Mild  degenerative changes of the right knee. No severe degenerative changes. No severe arthropathy. Ligaments Suboptimally assessed by CT. Muscles and Tendons Grossly unremarkable. Soft tissues Similar-appearing 38mm thin linear density along within the plantar midfoot soft tissue that may represent a retained radiopaque foreign body. Mild dorsal foot subcutaneus soft tissue edema as well as diffuse mild subcutaneus soft tissue edema of the lower visualized extremity. No organized fluid collection.  No hematoma formation. Vasculature: Grossly unremarkable. IMPRESSION: 1. No CT evidence of osteomyelitis of the RIGHT tibia fibula and bones of the RIGHT foot. 2. Similar-appearing 34mm thin linear density along within the right plantar midfoot soft tissue that may represent a retained radiopaque foreign body. 3. Nonspecific diffuse mild subcutaneus soft tissue edema. 4. Please see separately dictated CT tibia fibula and foot of the left tibia fibula and foot 05/30/2021. Electronically Signed   By: Tish Frederickson M.D.   On: 05/30/2021 21:29   CT FOOT LEFT W CONTRAST  Result Date: 05/30/2021 CLINICAL DATA:  Left lower extremity swelling for the past several months. EXAM: CT OF THE LOWER LEFT EXTREMITY WITH CONTRAST CT OF THE LOWER LEFT FOOT WITH CONTRAST TECHNIQUE: Multidetector CT imaging of the lower left extremity was performed according to the standard protocol following intravenous contrast administration. Multidetector CT imaging of the left foot was performed according to the standard protocol following intravenous contrast administration. CONTRAST:  OMNIPAQUE IOHEXOL 300 MG/ML  SOLN COMPARISON:  None. FINDINGS: Bones/Joint/Cartilage No fracture or dislocation. Normal alignment. No joint effusion. No periosteal reaction or bone destruction. Small erosion along the medial aspect of the first metatarsal head as can be seen with a crystalline arthropathy such as gout. Prior left subtalar arthrodesis with solid  osseous fusion across the subtalar joint. Severe joint space narrowing of the medial aspect of the left tibiotalar joint with mild subchondral reactive marrow changes. Subcortical sclerosis and cystic changes involving the distal fibula and lateral talus as can  be seen with calcaneofibular impingement Ligaments Ligaments are suboptimally evaluated by CT. Muscles and Tendons Muscles are normal. No muscle atrophy. Flexor, extensor, peroneal and Achilles tendons are grossly intact. Soft tissue No fluid collection or hematoma. No soft tissue mass. Mild generalized soft tissue edema within the subcutaneous fat with mild skin thickening throughout bilateral lower legs and feet which may be reactive secondary to venous insufficiency or lymphedema versus less likely cellulitis. IMPRESSION: 1. Mild generalized soft tissue edema within the subcutaneous fat with mild skin thickening throughout bilateral lower legs and feet which may be reactive secondary to venous insufficiency or lymphedema versus less likely cellulitis. 2. No evidence of osteomyelitis of the left foot and left lower leg. 3. Severe left medial tibiotalar joint space narrowing. 4. Subcortical sclerosis and cystic changes involving the distal fibula and lateral talus as can be seen with calcaneofibular impingement Electronically Signed   By: Elige Ko M.D.   On: 05/30/2021 21:33   CT TIBIA FIBULA RIGHT W CONTRAST  Result Date: 05/30/2021 CLINICAL DATA:  Foot swelling, diabetic, osteomyelitis suspected, xray done; Osteomyelitis suspected, tib/fib, no prior imaging EXAM: CT OF THE LOWER RIGHT EXTREMITY WITH CONTRAST TECHNIQUE: Multidetector CT imaging of the lower right extremity was performed according to the standard protocol following intravenous contrast administration. CONTRAST:  OMNIPAQUE IOHEXOL 300 MG/ML  SOLN COMPARISON:  X-ray right foot 05/30/2021 FINDINGS: RIGHT bones/Joint/Cartilage No cortical erosion or destruction. No acute displaced  fracture. No dislocation. Mild degenerative changes of the right knee. No severe degenerative changes. No severe arthropathy. Ligaments Suboptimally assessed by CT. Muscles and Tendons Grossly unremarkable. Soft tissues Similar-appearing 7mm thin linear density along within the plantar midfoot soft tissue that may represent a retained radiopaque foreign body. Mild dorsal foot subcutaneus soft tissue edema as well as diffuse mild subcutaneus soft tissue edema of the lower visualized extremity. No organized fluid collection.  No hematoma formation. Vasculature: Grossly unremarkable. IMPRESSION: 1. No CT evidence of osteomyelitis of the RIGHT tibia fibula and bones of the RIGHT foot. 2. Similar-appearing 7mm thin linear density along within the right plantar midfoot soft tissue that may represent a retained radiopaque foreign body. 3. Nonspecific diffuse mild subcutaneus soft tissue edema. 4. Please see separately dictated CT tibia fibula and foot of the left tibia fibula and foot 05/30/2021. Electronically Signed   By: Tish Frederickson M.D.   On: 05/30/2021 21:29   DG Foot Complete Right  Result Date: 05/30/2021 CLINICAL DATA:  pain, concern for infection EXAM: RIGHT FOOT COMPLETE - 3+ VIEW COMPARISON:  None. FINDINGS: No evidence of fracture, dislocation, or joint effusion. No cortical erosion or destruction. No evidence of severe arthropathy. No aggressive appearing focal bone abnormality. Diffuse dorsal subcutaneus soft tissue edema. 7mm thin linear density along the plantar aspect of the midfoot may represent a retained radiopaque foreign body. IMPRESSION: 1. No radiographic finding to suggest osteomyelitis. 2. No acute displaced fracture or dislocation. 3.  Diffuse dorsal subcutaneus soft tissue edema. 4. 7mm thin linear density along the plantar aspect of the midfoot may represent a retained radiopaque foreign body. Electronically Signed   By: Tish Frederickson M.D.   On: 05/30/2021 17:05    Medications    Scheduled Meds:  atorvastatin  20 mg Oral Daily   DULoxetine  60 mg Oral BID   enoxaparin (LOVENOX) injection  0.5 mg/kg Subcutaneous Q24H   furosemide  40 mg Intravenous Q12H   gemfibrozil  600 mg Oral BID AC   insulin aspart  0-5 Units Subcutaneous  QHS   insulin aspart  0-9 Units Subcutaneous TID WC   metroNIDAZOLE  500 mg Oral Q8H   pantoprazole  40 mg Oral Daily   pantoprazole (PROTONIX) EC tablet 40 mg  LOS: 0 days   Time spent: >67min  Leeroy Bock, DO [facility] 05/31/2021, 7:26 AM   Available by Epic secure chat 7AM-7PM. If 7PM-7AM, please contact night-coverage Refer to [email or web address] to contact the [facility] Attending or Consulting provider for this pt

## 2021-05-31 NOTE — Progress Notes (Signed)
Initial Nutrition Assessment  DOCUMENTATION CODES:   Morbid obesity  INTERVENTION:   -1 packet Juven BID, each packet provides 95 calories, 2.5 grams of protein (collagen), and 9.8 grams of carbohydrate (3 grams sugar); also contains 7 grams of L-arginine and L-glutamine, 300 mg vitamin C, 15 mg vitamin E, 1.2 mcg vitamin B-12, 9.5 mg zinc, 200 mg calcium, and 1.5 g  Calcium Beta-hydroxy-Beta-methylbutyrate to support wound healing  -Ensure Max po daily, each supplement provides 150 kcal and 30 grams of protein -MVI with minerals daily -Magic cup TID with meals, each supplement provides 290 kcal and 9 grams of protein   NUTRITION DIAGNOSIS:   Increased nutrient needs related to wound healing as evidenced by estimated needs.  GOAL:   Patient will meet greater than or equal to 90% of their needs  MONITOR:   PO intake, Supplement acceptance, Labs, Skin, I & O's  REASON FOR ASSESSMENT:   Consult Wound healing  ASSESSMENT:   Martin Riddle is a 50 y.o. male with medical history significant of type 2 diabetes mellitus, essential hypertension, GERD, hyperlipidemia, peripheral neuropathy, chronic pain syndrome s/p spinal cord stimulator who presents to [facility] ED on 12/22 with progressive lower extremity edema, redness, and difficulty ambulating greatest on right.  Patient states this has been present for several months and has progressively worsened.  His spouse reports that he has been weeping through his skin and had attempted compression wraps at home without success.  Patient denies any trauma, does still have feeling to his feet but has had several wounds in the past that required debridement by podiatry.  Patient denies headache, no dizziness, no chest pain, no palpitations, no fever/chills/night sweats, no nausea/vomiting/diarrhea, no abdominal pain, no fatigue.  Pt admitted with bilateral lower extremity cellulitis with multiple wounds and questionable rt foot foreign  body.  Reviewed I/O's: -1.4 L x 24 hours  UOP: 2.2 L x 24 hours  Per [facility] notes, pt with scattered full and partial thickness wounds on LEs and possible foreign body on rt plantar foot.   Pt unavailable at time of visit. RD unable to obtain further nutrition-related history or complete nutrition-focused physical exam at this time.    Pt currently on a heart healthy, carb modified diet. No meal completions available to assess at this time.   Reviewed wt hx; noted steady wt gain over the past several years.   Pt with increased nutritional needs for wound healing and would benefit from addition of oral nutrition supplements.   Medications reviewed and include lasix and miralax.   Lab Results  Component Value Date   HGBA1C 7.4 (H) 01/11/2017   PTA DM medications are 2 units insulin detemir BID and 7 units inuslin lispro TID.   Labs reviewed: CBGS: 96-182 (inpatient orders for glycemic control are 0-9 units insulin aspart TID with meals).    Diet Order:   Diet Order             Diet heart healthy/carb modified Room service appropriate? Yes; Fluid consistency: Thin  Diet effective now                   EDUCATION NEEDS:   No education needs have been identified at this time  Skin:  Skin Assessment: Skin Integrity Issues: Skin Integrity Issues:: Other (Comment) Other: possible foreign body on rt medial foot, scattered full and partial thickness wounds on LEs  Last BM:  Unknown  Height:   Ht Readings from Last 1 Encounters:  05/30/21  6\' 4"  (1.93 m)    Weight:   Wt Readings from Last 1 Encounters:  05/30/21 (!) 158.8 kg    Ideal Body Weight:  91.2 kg  BMI:  Body mass index is 42.6 kg/m.  Estimated Nutritional Needs:   Kcal:  2300-2500  Protein:  135-150 grams  Fluid:  > 2 L    06/01/21, RD, LDN, CDCES Registered Dietitian II Certified Diabetes Care and Education Specialist Please refer to [facility] for RD and/or RD on-call/weekend/after hours pager

## 2021-05-31 NOTE — Consult Note (Addendum)
WOC Nurse Consult Note: Reason for Consult: Patient with bilateral LE erythema and edema. Previously managed by [employer]. CT indicates possible foreign body on right foot plantar aspect, midfoot.  Podiatry has been consulted. Wound type: infectious in the presence of venous insufficiency Pressure Injury POA: Yes/No/NA Measurement:No measured today Wound GYF:VCBS, dry. Scattered full and partial thickness wounds on LEs, see photos taken in ED and uploaded to EMR Drainage (amount, consistency, odor) scant from full and partial thickness wounds Periwound:erythema, edema bilaterally Dressing procedure/placement/frequency: In the presence of infection, recommend daily care for a week rather than obscuring LEs with Unna's Boots.  Recommend topical care with silver hydrofiber to wounds and to the interdigital skin break between digits 4 and 5 on the left foot, then dry boot (Kerlix roll gauze) topped with ACE bandaging changed daily. Pressure redistribution using bilateral heel boots while in bed also correct the lateral rotation of the feet.  A sacral prophylactic foam dressing is recommended while in bed. After 7 days, I agree with reinstating compression using short stretch bandaging (Unna's Boots) unless the Primary Care Provider or Podiatry disagrees.  Education regarding the wearing of protective footwear at all times is provided.   Follow up with Podiatric Medicine is recommended for toenail care and management and oversight of this diabetic patient.  WOC nursing team will not follow, but will remain available to this patient, the nursing and medical teams.  Please re-consult if needed. Thanks, Ladona Mow, MSN, RN, GNP, [place]  Pager# [phone]

## 2021-05-31 NOTE — ED Notes (Signed)
Report received from Santia RN. Patient care assumed. Patient/RN introduction complete. Will continue to monitor.  °

## 2021-05-31 NOTE — Progress Notes (Signed)
*  PRELIMINARY RESULTS* Echocardiogram 2D Echocardiogram has been performed.  Martin Riddle Martin Riddle 05/31/2021, 12:15 PM

## 2021-05-31 NOTE — Progress Notes (Signed)
Inpatient Diabetes Program Recommendations  AACE/ADA: New Consensus Statement on Inpatient Glycemic Control (2015)  Target Ranges:  Prepandial:   less than 140 mg/dL      Peak postprandial:   less than 180 mg/dL (1-2 hours)      Critically ill patients:  140 - 180 mg/dL   Lab Results  Component Value Date   GLUCAP 182 (H) 05/31/2021   HGBA1C 7.4 (H) 01/11/2017    Review of Glycemic Control  Latest Reference Range & Units 05/30/21 17:23 05/30/21 18:09 05/31/21 07:35  Glucose-Capillary 70 - 99 mg/dL 41 (LL) 96 356 (H)   Diabetes history: DM 2 Outpatient Diabetes medications: NPH bid and Regular insulin tid SSI? Current orders for Inpatient glycemic control:  Novolog 0-9 units tid  A1c in process  Inpatient Diabetes Program Recommendations:    -  Increase Novolog 0-15 units tid -  Add Novolog hs scale  If glucose trends consistently rise above 180 consider starting Semglee 20 units Q24 hours (less than 0.15 units/kg)  Has been on Levemir 20 units bid and 7 units tid meal coverage in the past  Thanks,  Christena Deem RN, MSN, BC-ADM Inpatient Diabetes Coordinator Team Pager [phone] (8a-5p)

## 2021-05-31 NOTE — ED Notes (Signed)
Legs cleansed and dressed as ordered. Pt tolerated well.

## 2021-05-31 NOTE — ED Notes (Signed)
Informed RN bed assigned 

## 2021-06-01 DIAGNOSIS — K219 Gastro-esophageal reflux disease without esophagitis: Secondary | ICD-10-CM | POA: Diagnosis present

## 2021-06-01 DIAGNOSIS — Z885 Allergy status to narcotic agent status: Secondary | ICD-10-CM | POA: Diagnosis not present

## 2021-06-01 DIAGNOSIS — L0390 Cellulitis, unspecified: Secondary | ICD-10-CM | POA: Diagnosis present

## 2021-06-01 DIAGNOSIS — E7800 Pure hypercholesterolemia, unspecified: Secondary | ICD-10-CM | POA: Diagnosis present

## 2021-06-01 DIAGNOSIS — F32A Depression, unspecified: Secondary | ICD-10-CM | POA: Diagnosis present

## 2021-06-01 DIAGNOSIS — E1142 Type 2 diabetes mellitus with diabetic polyneuropathy: Secondary | ICD-10-CM | POA: Diagnosis present

## 2021-06-01 DIAGNOSIS — Z8249 Family history of ischemic heart disease and other diseases of the circulatory system: Secondary | ICD-10-CM | POA: Diagnosis not present

## 2021-06-01 DIAGNOSIS — Z6841 Body Mass Index (BMI) 40.0 and over, adult: Secondary | ICD-10-CM | POA: Diagnosis not present

## 2021-06-01 DIAGNOSIS — I10 Essential (primary) hypertension: Secondary | ICD-10-CM | POA: Diagnosis present

## 2021-06-01 DIAGNOSIS — I878 Other specified disorders of veins: Secondary | ICD-10-CM | POA: Diagnosis present

## 2021-06-01 DIAGNOSIS — E11649 Type 2 diabetes mellitus with hypoglycemia without coma: Secondary | ICD-10-CM | POA: Diagnosis present

## 2021-06-01 DIAGNOSIS — L03119 Cellulitis of unspecified part of limb: Secondary | ICD-10-CM | POA: Diagnosis not present

## 2021-06-01 DIAGNOSIS — G894 Chronic pain syndrome: Secondary | ICD-10-CM | POA: Diagnosis present

## 2021-06-01 DIAGNOSIS — F419 Anxiety disorder, unspecified: Secondary | ICD-10-CM | POA: Diagnosis present

## 2021-06-01 DIAGNOSIS — Z794 Long term (current) use of insulin: Secondary | ICD-10-CM | POA: Diagnosis not present

## 2021-06-01 DIAGNOSIS — L03115 Cellulitis of right lower limb: Secondary | ICD-10-CM | POA: Diagnosis present

## 2021-06-01 DIAGNOSIS — Z79899 Other long term (current) drug therapy: Secondary | ICD-10-CM | POA: Diagnosis not present

## 2021-06-01 DIAGNOSIS — Z833 Family history of diabetes mellitus: Secondary | ICD-10-CM | POA: Diagnosis not present

## 2021-06-01 DIAGNOSIS — Z20822 Contact with and (suspected) exposure to covid-19: Secondary | ICD-10-CM | POA: Diagnosis present

## 2021-06-01 DIAGNOSIS — L03116 Cellulitis of left lower limb: Secondary | ICD-10-CM | POA: Diagnosis present

## 2021-06-01 DIAGNOSIS — I872 Venous insufficiency (chronic) (peripheral): Secondary | ICD-10-CM | POA: Diagnosis present

## 2021-06-01 DIAGNOSIS — I890 Lymphedema, not elsewhere classified: Secondary | ICD-10-CM | POA: Diagnosis present

## 2021-06-01 DIAGNOSIS — F17220 Nicotine dependence, chewing tobacco, uncomplicated: Secondary | ICD-10-CM | POA: Diagnosis present

## 2021-06-01 LAB — BASIC METABOLIC PANEL
Anion gap: 9 (ref 5–15)
BUN: 27 mg/dL — ABNORMAL HIGH (ref 6–20)
CO2: 29 mmol/L (ref 22–32)
Calcium: 9.4 mg/dL (ref 8.9–10.3)
Chloride: 92 mmol/L — ABNORMAL LOW (ref 98–111)
Creatinine, Ser: 1.26 mg/dL — ABNORMAL HIGH (ref 0.61–1.24)
GFR, Estimated: >60 mL/min (ref >60)
Glucose, Bld: 356 mg/dL — ABNORMAL HIGH (ref 70–99)
Potassium: 4.7 mmol/L (ref 3.5–5.1)
Sodium: 130 mmol/L — ABNORMAL LOW (ref 135–145)

## 2021-06-01 LAB — CBC
HCT: 42.3 % (ref 39.0–52.0)
Hemoglobin: 14.1 g/dL (ref 13.0–17.0)
MCH: 31.8 pg (ref 26.0–34.0)
MCHC: 33.3 g/dL (ref 30.0–36.0)
MCV: 95.5 fL (ref 80.0–100.0)
Platelets: 242 10*3/uL (ref 150–400)
RBC: 4.43 MIL/uL (ref 4.22–5.81)
RDW: 13.9 % (ref 11.5–15.5)
WBC: 9.7 10*3/uL (ref 4.0–10.5)
nRBC: 0 % (ref 0.0–0.2)

## 2021-06-01 LAB — GLUCOSE, CAPILLARY
Glucose-Capillary: 340 mg/dL — ABNORMAL HIGH (ref 70–99)
Glucose-Capillary: 368 mg/dL — ABNORMAL HIGH (ref 70–99)
Glucose-Capillary: 370 mg/dL — ABNORMAL HIGH (ref 70–99)

## 2021-06-01 LAB — HEMOGLOBIN A1C
Hgb A1c MFr Bld: 6.3 % — ABNORMAL HIGH (ref 4.8–5.6)
Mean Plasma Glucose: 134 mg/dL

## 2021-06-01 MED ORDER — DOXYCYCLINE HYCLATE 100 MG PO TABS: 100.0000 mg | ORAL_TABLET | Freq: Two times a day (BID) | ORAL | 0 refills | Status: AC

## 2021-06-01 MED ORDER — ATORVASTATIN CALCIUM 20 MG PO TABS: 20.0000 mg | ORAL_TABLET | Freq: Every day | ORAL | 1 refills | Status: AC

## 2021-06-01 MED ORDER — OXYCODONE-ACETAMINOPHEN 7.5-325 MG PO TABS: 1.0000 | ORAL_TABLET | ORAL | 0 refills | Status: AC | PRN

## 2021-06-01 MED ORDER — ADULT MULTIVITAMIN W/MINERALS CH: 1.0000 | ORAL_TABLET | Freq: Every day | ORAL | 1 refills | Status: AC

## 2021-06-01 MED ORDER — AMOXICILLIN-POT CLAVULANATE 875-125 MG PO TABS: 1.0000 | ORAL_TABLET | Freq: Two times a day (BID) | ORAL | 0 refills | Status: AC

## 2021-06-01 MED ORDER — TORSEMIDE 20 MG PO TABS: 20.0000 mg | ORAL_TABLET | Freq: Every day | ORAL | 1 refills | Status: DC

## 2021-06-01 MED ORDER — GABAPENTIN 300 MG PO CAPS: 300.0000 mg | ORAL_CAPSULE | Freq: Three times a day (TID) | ORAL | 1 refills | Status: DC

## 2021-06-01 NOTE — TOC Initial Note (Addendum)
Transition of Care [facility]) - Initial/Assessment Note    Patient Details  Name: Martin Riddle MRN: [id number] Date of Birth: September 20, 1970  Transition of Care [facility]) CM/SW Contact:    Liliana Cline, LCSW Phone Number: 06/01/2021, 10:01 AM  Clinical Narrative:                Spoke with patient regarding DC planning. MD has ordered [facility] for wound care. Patient is agreeable to [facility], no HH history or agency preference. Confirmed home address. Reached out to [facility] with [facility], waiting on confirmation if they can accept patient. Patient lives with his wife who he says will provide transportation to appointments. PCP is Dr. Dareen Piano. Pharmacy is [facility].   10:50- [facility] unable to accept for [facility] due to staffing. Reached out to [place] with [facility] and [place] with [facility], waiting to hear back.   10:55- [facility] can accept patient but earliest start of care would be Monday 12/26. Notified MD.    12:55- MD is DC patient home today and is aware wound care HHRN cannot start until Monday. MD requested RN send patient home with wound care supplies so wife can do wound care over weekend until [facility] can begin. Barbara Cower with [facility] is notified. Patient knows his wife will need to pick up his medications before the pharmacy closes today due to holiday hours. Says he will have her pick them up before she picks patient up from the hospital.  Expected Discharge Plan: Home w Home Health Services Barriers to Discharge: Continued Medical Work up   Patient Goals and CMS Choice Patient states their goals for this hospitalization and ongoing recovery are:: home with [facility] CMS Medicare.gov Compare Post Acute Care list provided to:: Patient Choice offered to / list presented to : Patient  Expected Discharge Plan and Services Expected Discharge Plan: Home w Home Health Services       Living arrangements for the past 2 months: Single Family Home                           HH Arranged:  RN          Prior Living Arrangements/Services Living arrangements for the past 2 months: Single Family Home Lives with:: Spouse Patient language and need for interpreter reviewed:: Yes Do you feel safe going back to the place where you live?: Yes      Need for Family Participation in Patient Care: Yes (Comment) Care giver support system in place?: Yes (comment)   Criminal Activity/Legal Involvement Pertinent to Current Situation/Hospitalization: No - Comment as needed  Activities of Daily Living Home Assistive Devices/Equipment: CBG Meter, Other (Comment) ("grabbing things aid") ADL Screening (condition at time of admission) Patient's cognitive ability adequate to safely complete daily activities?: Yes Is the patient deaf or have difficulty hearing?: No Does the patient have difficulty seeing, even when wearing glasses/contacts?: No Does the patient have difficulty concentrating, remembering, or making decisions?: No Patient able to express need for assistance with ADLs?: Yes Does the patient have difficulty dressing or bathing?: No Independently performs ADLs?: Yes (appropriate for developmental age) Does the patient have difficulty walking or climbing stairs?: No Weakness of Legs: None Weakness of Arms/Hands: None  Permission Sought/Granted Permission sought to share information with : Facility Industrial/product designer granted to share information with : Yes, Verbal Permission Granted     Permission granted to share info w AGENCY: HH agencies  Emotional Assessment       Orientation: : Oriented to Self, Oriented to Place, Oriented to  Time, Oriented to Situation Alcohol / Substance Use: Not Applicable Psych Involvement: No (comment)  Admission diagnosis:  Cellulitis [L03.90] Wound cellulitis [L03.90] Cellulitis, unspecified cellulitis site [L03.90] Patient Active Problem List   Diagnosis Date Noted   Chronic venous stasis dermatitis of both lower  extremities    Cellulitis 05/30/2021   Multiple open wounds of lower extremity, initial encounter 05/30/2021   Respiratory failure (HCC) 02/19/2018   Severe recurrent major depression with psychotic features (HCC) 01/09/2017   Congenital flat foot 01/16/2015   DDD (degenerative disc disease), lumbar 01/16/2015   Diabetes mellitus, type 2 (HCC) 01/16/2015   Hypercholesteremia 01/16/2015   Gastro-esophageal reflux disease without esophagitis 01/16/2015   Peripheral neuropathy 01/16/2015   PCP:  Lauro Regulus, MD Pharmacy:   [id number] [place],  - [address] AT [facility] [address] [place] [address] Phone: [phone] Fax: [phone]     Social Determinants of Health (SDOH) Interventions    Readmission Risk Interventions No flowsheet data found.

## 2021-06-01 NOTE — Discharge Summary (Signed)
Physician Discharge Summary  Martin Riddle ZOX:[id number] DOB: 1970-08-17 DOA: 05/30/2021  PCP: Martin Regulus, MD  Admit date: 05/30/2021 Discharge date: 06/01/2021  Admitted From: Home Disposition: Home  Recommendations for Outpatient Follow-up:  Follow up with PCP in 1-2 weeks Follow-up with podiatry in 1 week Please obtain BMP/CBC in one week Please follow up on the following pending results: None  Home Health: Yes Equipment/Devices: None Discharge Condition: Stable CODE STATUS: Full Diet recommendation: Heart Healthy / Carb Modified   Brief/Interim Summary: Martin Riddle is a 50 y.o. male with a PMH significant for type 2 diabetes mellitus, essential hypertension, GERD, hyperlipidemia, peripheral neuropathy, chronic pain syndrome s/p spinal cord stimulator. They presented from home to the ED on 05/30/2021 with bilateral lower extremity swelling and pain chronically but acutely worsened x1 days. In the ED, it was found that they had no DVT, no signs of osteomyelitis. There are signs of possible LE cellulitis but more consistent with vascular congestion changes on CT. They were treated with analgesia, leg wraps. Podiatry was consulted and he underwent debridement of chronic ulcers, most likely secondary to chronic venous congestion. He was placed on ceftriaxone and Flagyl empirically and discharged on Augmentin and doxycycline.  He needs to follow-up with his podiatrist closely.  They were recommending daily bandage change and preferably Unna boots in the future once these ulcers heals.  We also arrange home health services for wound care.  His wife will help over the weekend.  He was also given daily torsemide to help with lower extremity edema.  Dose can be titrated as needed by PCP.  Patient will continue the rest of his home medications and follow-up with his providers.  Discharge Diagnoses:  Principal Problem:   Cellulitis Active Problems:   Diabetes mellitus,  type 2 (HCC)   Hypercholesteremia   Gastro-esophageal reflux disease without esophagitis   Peripheral neuropathy   Multiple open wounds of lower extremity, initial encounter   Discharge Instructions  Discharge Instructions     Diet - low sodium heart healthy   Complete by: As directed    Discharge instructions   Complete by: As directed    It was pleasure taking care of you. You are being given antibiotics for 7 days, please take it as directed You are also being given some pain medications to be used only if needed. Please follow-up closely with your primary care doctor and podiatrist for further recommendations   Discharge wound care:   Complete by: As directed    Wound care to bilateral LEs including feet:  Wash with soap and water, particularly between digits. Dry gently, but thoroughly.  Place a strip (cut from a 4x4 inch piece) of silver hydrofiber  dressing (Aquacel Ag+ Advantage, Lawson # [id number]) between the toes on the left foot, 4th and 5th digit.  Place pieces of silver hydrofiber over all open areas. Apply xeroform to the right lateral foot and ankle blister formation.  Cover wounds using an ABD pad placed both anteriorly and posteriorly and secure with Kerlix roll gauze wrapped from just below toes to just below knees. Note: two rolls of Kerlix roll gauze may be required. Secure with paper tape. Top Kerlix with ACE bandage wrapped in a similar manner with moderate compression from forefoot to below knee.   Place feet into [employer].  Change dressings once daily and PRN dressing dislodgement.  05/31/21 1333   Increase activity slowly   Complete by: As directed  Allergies as of 06/01/2021       Reactions   Morphine And Related Other (See Comments)   headache        Medication List     STOP taking these medications    oxyCODONE ER 27 MG C12a   Oxycodone HCl 10 MG Tabs       TAKE these medications    acetaminophen 500 MG tablet Commonly known as:  TYLENOL Take 1,000 mg by mouth every 6 (six) hours as needed for mild pain or moderate pain.   amoxicillin-clavulanate 875-125 MG tablet Commonly known as: AUGMENTIN Take 1 tablet by mouth 2 (two) times daily for 7 days.   atorvastatin 20 MG tablet Commonly known as: LIPITOR Take 1 tablet (20 mg total) by mouth daily. Start taking on: June 02, 2021 What changed:  medication strength how much to take   busPIRone 15 MG tablet Commonly known as: BUSPAR Take 15 mg by mouth 3 (three) times daily.   cetirizine 10 MG tablet Commonly known as: ZYRTEC Take 10 mg by mouth daily.   cloNIDine 0.2 MG tablet Commonly known as: CATAPRES Take 0.2 mg by mouth 3 (three) times daily.   doxycycline 100 MG tablet Commonly known as: VIBRA-TABS Take 1 tablet (100 mg total) by mouth 2 (two) times daily for 7 days.   DULoxetine 60 MG capsule Commonly known as: CYMBALTA Take 60 mg by mouth daily.   gabapentin 300 MG capsule Commonly known as: NEURONTIN Take 1 capsule (300 mg total) by mouth 3 (three) times daily.   gemfibrozil 600 MG tablet Commonly known as: LOPID Take 1 tablet (600 mg total) by mouth 2 (two) times daily before a meal.   insulin NPH Human 100 UNIT/ML injection Commonly known as: NOVOLIN N Inject 0-50 Units into the skin 2 (two) times daily.   insulin regular 100 units/mL injection Commonly known as: NOVOLIN R Inject into the skin See admin instructions. Inject twice daily according to blood glucose reading   losartan-hydrochlorothiazide 100-25 MG tablet Commonly known as: HYZAAR Take 1 tablet by mouth daily.   multivitamin with minerals Tabs tablet Take 1 tablet by mouth daily. Start taking on: June 02, 2021   naproxen sodium 220 MG tablet Commonly known as: ALEVE Take 440 mg by mouth 2 (two) times daily as needed (pain).   oxyCODONE-acetaminophen 7.5-325 MG tablet Commonly known as: Percocet Take 1 tablet by mouth every 4 (four) hours as needed for  severe pain.   polyethylene glycol 17 g packet Commonly known as: MIRALAX / GLYCOLAX Take 17 g by mouth daily.   torsemide 20 MG tablet Commonly known as: DEMADEX Take 1 tablet (20 mg total) by mouth daily.               Discharge Care Instructions  (From admission, onward)           Start     Ordered   06/01/21 0000  Discharge wound care:       Comments: Wound care to bilateral LEs including feet:  Wash with soap and water, particularly between digits. Dry gently, but thoroughly.  Place a strip (cut from a 4x4 inch piece) of silver hydrofiber  dressing (Aquacel Ag+ Advantage, Lawson # [id number]) between the toes on the left foot, 4th and 5th digit.  Place pieces of silver hydrofiber over all open areas. Apply xeroform to the right lateral foot and ankle blister formation.  Cover wounds using an ABD pad placed both anteriorly and posteriorly and secure  with Kerlix roll gauze wrapped from just below toes to just below knees. Note: two rolls of Kerlix roll gauze may be required. Secure with paper tape. Top Kerlix with ACE bandage wrapped in a similar manner with moderate compression from forefoot to below knee.   Place feet into [employer].  Change dressings once daily and PRN dressing dislodgement.  05/31/21 1333   06/01/21 1304            Follow-up Information     [facility]             . Schedule an appointment as soon as possible for a visit in 1 week(s).   Why: Wound care Contact information: [address] [place] [address] [phone]        [facility]. Schedule an appointment as soon as possible for a visit in 2 week(s).   Why: Discussion of compression devices/sweling management Contact information: [address] [facility] [place] [address] [phone]        Rosetta Posner, DPM Follow up in 1 month(s).   Specialty: Podiatry Why: diabetic foot  care Contact information: [address] [place] [address] [phone]         Martin Regulus, MD. Schedule an appointment as soon as possible for a visit in 1 week(s).   Specialty: Internal Medicine Contact information: [address] [facility] [place] [address] [phone]                Allergies  Allergen Reactions   Morphine And Related Other (See Comments)    headache    Consultations: Podiatry  Procedures/Studies: DG Chest 2 View  Result Date: 05/30/2021 CLINICAL DATA:  A 50 year old male presents for evaluation of lower extremity edema for the past several months. EXAM: CHEST - 2 VIEW COMPARISON:  February 27, 2018. FINDINGS: Trachea midline. Cardiomediastinal contours and hilar structures are normal. Linear areas of airspace disease at the LEFT lung base overlying the LEFT hemidiaphragm. No signs of lobar consolidation. The no pleural effusion. Spinal nerve stimulator projects over the lower thoracic spine. No acute skeletal process on limited assessment. IMPRESSION: Linear areas of airspace disease at the LEFT lung base, findings may represent atelectasis or scarring. No signs of lobar consolidation or pleural effusion. Electronically Signed   By: Donzetta Kohut M.D.   On: 05/30/2021 13:07   CT TIBIA FIBULA LEFT W CONTRAST  Result Date: 05/30/2021 CLINICAL DATA:  Left lower extremity swelling for the past several months. EXAM: CT OF THE LOWER LEFT EXTREMITY WITH CONTRAST CT OF THE LOWER LEFT FOOT WITH CONTRAST TECHNIQUE: Multidetector CT imaging of the lower left extremity was performed according to the standard protocol following intravenous contrast administration. Multidetector CT imaging of the left foot was performed according to the standard protocol following intravenous contrast administration. CONTRAST:  OMNIPAQUE IOHEXOL 300 MG/ML  SOLN COMPARISON:  None. FINDINGS: Bones/Joint/Cartilage No fracture or  dislocation. Normal alignment. No joint effusion. No periosteal reaction or bone destruction. Small erosion along the medial aspect of the first metatarsal head as can be seen with a crystalline arthropathy such as gout. Prior left subtalar arthrodesis with solid osseous fusion across the subtalar joint. Severe joint space narrowing of the medial aspect of the left tibiotalar joint with mild subchondral reactive marrow changes. Subcortical sclerosis and cystic changes involving the distal fibula and lateral talus as can be seen with calcaneofibular impingement  Ligaments Ligaments are suboptimally evaluated by CT. Muscles and Tendons Muscles are normal. No muscle atrophy. Flexor, extensor, peroneal and Achilles tendons are grossly intact. Soft tissue No fluid collection or hematoma. No soft tissue mass. Mild generalized soft tissue edema within the subcutaneous fat with mild skin thickening throughout bilateral lower legs and feet which may be reactive secondary to venous insufficiency or lymphedema versus less likely cellulitis. IMPRESSION: 1. Mild generalized soft tissue edema within the subcutaneous fat with mild skin thickening throughout bilateral lower legs and feet which may be reactive secondary to venous insufficiency or lymphedema versus less likely cellulitis. 2. No evidence of osteomyelitis of the left foot and left lower leg. 3. Severe left medial tibiotalar joint space narrowing. 4. Subcortical sclerosis and cystic changes involving the distal fibula and lateral talus as can be seen with calcaneofibular impingement Electronically Signed   By: Elige Ko M.D.   On: 05/30/2021 21:33   CT FOOT RIGHT W CONTRAST  Result Date: 05/30/2021 CLINICAL DATA:  Foot swelling, diabetic, osteomyelitis suspected, xray done; Osteomyelitis suspected, tib/fib, no prior imaging EXAM: CT OF THE LOWER RIGHT EXTREMITY WITH CONTRAST TECHNIQUE: Multidetector CT imaging of the lower right extremity was performed according  to the standard protocol following intravenous contrast administration. CONTRAST:  OMNIPAQUE IOHEXOL 300 MG/ML  SOLN COMPARISON:  X-ray right foot 05/30/2021 FINDINGS: RIGHT bones/Joint/Cartilage No cortical erosion or destruction. No acute displaced fracture. No dislocation. Mild degenerative changes of the right knee. No severe degenerative changes. No severe arthropathy. Ligaments Suboptimally assessed by CT. Muscles and Tendons Grossly unremarkable. Soft tissues Similar-appearing 7mm thin linear density along within the plantar midfoot soft tissue that may represent a retained radiopaque foreign body. Mild dorsal foot subcutaneus soft tissue edema as well as diffuse mild subcutaneus soft tissue edema of the lower visualized extremity. No organized fluid collection.  No hematoma formation. Vasculature: Grossly unremarkable. IMPRESSION: 1. No CT evidence of osteomyelitis of the RIGHT tibia fibula and bones of the RIGHT foot. 2. Similar-appearing 7mm thin linear density along within the right plantar midfoot soft tissue that may represent a retained radiopaque foreign body. 3. Nonspecific diffuse mild subcutaneus soft tissue edema. 4. Please see separately dictated CT tibia fibula and foot of the left tibia fibula and foot 05/30/2021. Electronically Signed   By: Tish Frederickson M.D.   On: 05/30/2021 21:29   CT FOOT LEFT W CONTRAST  Result Date: 05/30/2021 CLINICAL DATA:  Left lower extremity swelling for the past several months. EXAM: CT OF THE LOWER LEFT EXTREMITY WITH CONTRAST CT OF THE LOWER LEFT FOOT WITH CONTRAST TECHNIQUE: Multidetector CT imaging of the lower left extremity was performed according to the standard protocol following intravenous contrast administration. Multidetector CT imaging of the left foot was performed according to the standard protocol following intravenous contrast administration. CONTRAST:  OMNIPAQUE IOHEXOL 300 MG/ML  SOLN COMPARISON:  None. FINDINGS:  Bones/Joint/Cartilage No fracture or dislocation. Normal alignment. No joint effusion. No periosteal reaction or bone destruction. Small erosion along the medial aspect of the first metatarsal head as can be seen with a crystalline arthropathy such as gout. Prior left subtalar arthrodesis with solid osseous fusion across the subtalar joint. Severe joint space narrowing of the medial aspect of the left tibiotalar joint with mild subchondral reactive marrow changes. Subcortical sclerosis and cystic changes involving the distal fibula and lateral talus as can be seen with calcaneofibular impingement Ligaments Ligaments are suboptimally evaluated by CT. Muscles and Tendons Muscles are normal. No muscle atrophy. Flexor,  extensor, peroneal and Achilles tendons are grossly intact. Soft tissue No fluid collection or hematoma. No soft tissue mass. Mild generalized soft tissue edema within the subcutaneous fat with mild skin thickening throughout bilateral lower legs and feet which may be reactive secondary to venous insufficiency or lymphedema versus less likely cellulitis. IMPRESSION: 1. Mild generalized soft tissue edema within the subcutaneous fat with mild skin thickening throughout bilateral lower legs and feet which may be reactive secondary to venous insufficiency or lymphedema versus less likely cellulitis. 2. No evidence of osteomyelitis of the left foot and left lower leg. 3. Severe left medial tibiotalar joint space narrowing. 4. Subcortical sclerosis and cystic changes involving the distal fibula and lateral talus as can be seen with calcaneofibular impingement Electronically Signed   By: Elige Ko M.D.   On: 05/30/2021 21:33   CT TIBIA FIBULA RIGHT W CONTRAST  Result Date: 05/30/2021 CLINICAL DATA:  Foot swelling, diabetic, osteomyelitis suspected, xray done; Osteomyelitis suspected, tib/fib, no prior imaging EXAM: CT OF THE LOWER RIGHT EXTREMITY WITH CONTRAST TECHNIQUE: Multidetector CT imaging of the  lower right extremity was performed according to the standard protocol following intravenous contrast administration. CONTRAST:  OMNIPAQUE IOHEXOL 300 MG/ML  SOLN COMPARISON:  X-ray right foot 05/30/2021 FINDINGS: RIGHT bones/Joint/Cartilage No cortical erosion or destruction. No acute displaced fracture. No dislocation. Mild degenerative changes of the right knee. No severe degenerative changes. No severe arthropathy. Ligaments Suboptimally assessed by CT. Muscles and Tendons Grossly unremarkable. Soft tissues Similar-appearing 73mm thin linear density along within the plantar midfoot soft tissue that may represent a retained radiopaque foreign body. Mild dorsal foot subcutaneus soft tissue edema as well as diffuse mild subcutaneus soft tissue edema of the lower visualized extremity. No organized fluid collection.  No hematoma formation. Vasculature: Grossly unremarkable. IMPRESSION: 1. No CT evidence of osteomyelitis of the RIGHT tibia fibula and bones of the RIGHT foot. 2. Similar-appearing 64mm thin linear density along within the right plantar midfoot soft tissue that may represent a retained radiopaque foreign body. 3. Nonspecific diffuse mild subcutaneus soft tissue edema. 4. Please see separately dictated CT tibia fibula and foot of the left tibia fibula and foot 05/30/2021. Electronically Signed   By: Tish Frederickson M.D.   On: 05/30/2021 21:29   US ARTERIAL ABI (SCREENING LOWER EXTREMITY)  Result Date: 05/31/2021 CLINICAL DATA:  Bilateral lower leg ulcers. Bilateral lower extremity rest pain. Right claudication. Diabetes, hypertension, hyperlipidemia, obesity, history of tobacco abuse. EXAM: NONINVASIVE PHYSIOLOGIC VASCULAR STUDY OF BILATERAL LOWER EXTREMITIES TECHNIQUE: Evaluation of both lower extremities were performed at rest, including calculation of ankle-brachial indices with single level Doppler, pressure and pulse volume recording. COMPARISON:  None. FINDINGS: Right ABI:  1.15 Left ABI:   1.02 Right Lower Extremity:  Normal arterial waveforms at the ankle. Left Lower Extremity:  Normal arterial waveforms at the ankle. IMPRESSION: No evidence of hemodynamically significant lower extremity arterial occlusive disease at rest. Electronically Signed   By: Corlis Leak M.D.   On: 05/31/2021 10:11   DG Foot Complete Right  Result Date: 05/30/2021 CLINICAL DATA:  pain, concern for infection EXAM: RIGHT FOOT COMPLETE - 3+ VIEW COMPARISON:  None. FINDINGS: No evidence of fracture, dislocation, or joint effusion. No cortical erosion or destruction. No evidence of severe arthropathy. No aggressive appearing focal bone abnormality. Diffuse dorsal subcutaneus soft tissue edema. 28mm thin linear density along the plantar aspect of the midfoot may represent a retained radiopaque foreign body. IMPRESSION: 1. No radiographic finding to suggest osteomyelitis. 2. No  acute displaced fracture or dislocation. 3.  Diffuse dorsal subcutaneus soft tissue edema. 4. 7mm thin linear density along the plantar aspect of the midfoot may represent a retained radiopaque foreign body. Electronically Signed   By: Tish Frederickson M.D.   On: 05/30/2021 17:05   ECHOCARDIOGRAM LIMITED  Result Date: 05/31/2021    ECHOCARDIOGRAM LIMITED REPORT   Patient Name:   Martin Riddle Date of Exam: 05/31/2021 Medical Rec #:  [id number]          Height:       76.0 in Accession #:    [id number]         Weight:       350.0 lb Date of Birth:  [birth year]           BSA:          2.810 m Patient Age:    50 years           BP:           144/75 mmHg Patient Gender: M                  HR:           97 bpm. Exam Location:  [facility] Procedure: 2D Echo, Color Doppler, Cardiac Doppler and Intracardiac            Opacification Agent Indications:     R06.00 Dyspnea  History:         Patient has prior history of Echocardiogram examinations. Risk                  Factors:Hypertension, Diabetes and HCL.  Sonographer:     Humphrey Rolls Referring Phys:  [id number] ERIC J  [place] Diagnosing Phys: Julien Nordmann MD  Sonographer Comments: No apical window and no subcostal window. Image acquisition challenging due to patient body habitus. IMPRESSIONS  1. No evidence of mitral stenosis.  2. Aortic valve sclerosis is present, with no evidence of aortic valve stenosis.  3. Tricuspid regurgitation signal is inadequate for assessing PA pressure.  4. The inferior vena cava is normal in size with greater than 50% respiratory variability, suggesting right atrial pressure of 3 mmHg.  5. The left ventricle has normal function. FINDINGS  Left Ventricle: The left ventricle has normal function. Definity contrast agent was given IV to delineate the left ventricular endocardial borders. Right Ventricle: Tricuspid regurgitation signal is inadequate for assessing PA pressure. Mitral Valve: No evidence of mitral valve stenosis. MV peak gradient, 3.4 mmHg. The mean mitral valve gradient is 2.0 mmHg. Tricuspid Valve: No evidence of tricuspid stenosis. Aortic Valve: Aortic valve sclerosis is present, with no evidence of aortic valve stenosis. Aortic valve mean gradient measures 6.0 mmHg. Aortic valve peak gradient measures 9.2 mmHg. Aortic valve area, by VTI measures 3.22 cm. Pulmonic Valve: No evidence of pulmonic stenosis. Venous: The inferior vena cava is normal in size with greater than 50% respiratory variability, suggesting right atrial pressure of 3 mmHg. LEFT VENTRICLE PLAX 2D LVIDd:         4.62 cm LVIDs:         2.74 cm LV PW:         1.43 cm LV IVS:        1.37 cm LVOT diam:     2.30 cm LV SV:         69 LV SV Index:   25 LVOT Area:     4.15 cm  LEFT ATRIUM  Index LA diam:      3.60 cm 1.28 cm/m LA Vol (A4C): 53.7 ml 19.11 ml/m  AORTIC VALVE                     PULMONIC VALVE AV Area (Vmax):    3.23 cm      PV Vmax:       1.17 m/s AV Area (Vmean):   2.69 cm      PV Vmean:      80.600 cm/s AV Area (VTI):     3.22 cm      PV VTI:        0.213 m AV Vmax:           152.00 cm/s   PV  Peak grad:  5.5 mmHg AV Vmean:          112.000 cm/s  PV Mean grad:  3.0 mmHg AV VTI:            0.214 m AV Peak Grad:      9.2 mmHg AV Mean Grad:      6.0 mmHg LVOT Vmax:         118.00 cm/s LVOT Vmean:        72.500 cm/s LVOT VTI:          0.166 m LVOT/AV VTI ratio: 0.78  AORTA Ao Root diam: 3.80 cm MITRAL VALVE MV Area (PHT): 5.38 cm    SHUNTS MV Area VTI:   4.20 cm    Systemic VTI:  0.17 m MV Peak grad:  3.4 mmHg    Systemic Diam: 2.30 cm MV Mean grad:  2.0 mmHg MV Vmax:       0.92 m/s MV Vmean:      58.9 cm/s MV Decel Time: 141 msec MV E velocity: 65.60 cm/s MV A velocity: 98.60 cm/s MV E/A ratio:  0.67 Julien Nordmann MD Electronically signed by Julien Nordmann MD Signature Date/Time: 05/31/2021/12:56:38 PM    Final (Updated)     Subjective: Patient was seen and examined today.  Continues to have bilateral lower extremity pain.  He was asking for some pain medications to take home so he can follow-up with his providers after the holidays.  Per patient his wife can help with the dressing changes as a home health services will start from Monday.  Denies any worsening in his symptoms.  No fever or chills  Discharge Exam: Vitals:   06/01/21 0503 06/01/21 0927  BP: (!) 149/91 (!) 143/76  Pulse: 93 91  Resp: 20 20  Temp: (!) 97.5 F (36.4 C) 98.2 F (36.8 C)  SpO2: 90% 92%   Vitals:   05/31/21 1626 05/31/21 2009 06/01/21 0503 06/01/21 0927  BP: (!) 161/79 (!) 148/81 (!) 149/91 (!) 143/76  Pulse: 95 97 93 91  Resp: 18 20 20 20   Temp: 98.2 F (36.8 C) 98.1 F (36.7 C) (!) 97.5 F (36.4 C) 98.2 F (36.8 C)  TempSrc: Oral Oral Oral Oral  SpO2: 94% 93% 90% 92%  Weight:      Height:        General: Pt is alert, awake, not in acute distress Cardiovascular: RRR, S1/S2 +, no rubs, no gallops Respiratory: CTA bilaterally, no wheezing, no rhonchi Abdominal: Soft, NT, ND, bowel sounds + Extremities: Bilateral lower extremities with Ace wrap.  The results of significant diagnostics from this  hospitalization (including imaging, microbiology, ancillary and laboratory) are listed below for reference.    Microbiology: Recent Results (from the past  240 hour(s))  Resp Panel by RT-PCR (Flu A&B, Covid) Nasopharyngeal Swab     Status: None   Collection Time: 05/30/21  5:30 PM   Specimen: Nasopharyngeal Swab; Nasopharyngeal(NP) swabs in vial transport medium  Result Value Ref Range Status   SARS Coronavirus 2 by RT PCR NEGATIVE NEGATIVE Final    Comment: (NOTE) SARS-CoV-2 target nucleic acids are NOT DETECTED.  The SARS-CoV-2 RNA is generally detectable in upper respiratory specimens during the acute phase of infection. The lowest concentration of SARS-CoV-2 viral copies this assay can detect is 138 copies/mL. A negative result does not preclude SARS-Cov-2 infection and should not be used as the sole basis for treatment or other patient management decisions. A negative result may occur with  improper specimen collection/handling, submission of specimen other than nasopharyngeal swab, presence of viral mutation(s) within the areas targeted by this assay, and inadequate number of viral copies(<138 copies/mL). A negative result must be combined with clinical observations, patient history, and epidemiological information. The expected result is Negative.  Fact Sheet for Patients:  BloggerCourse.com  Fact Sheet for Healthcare Providers:  SeriousBroker.it  This test is no t yet approved or cleared by the [place] FDA and  has been authorized for detection and/or diagnosis of SARS-CoV-2 by FDA under an Emergency Use Authorization (EUA). This EUA will remain  in effect (meaning this test can be used) for the duration of the COVID-19 declaration under Section 564(b)(1) of the Act, 21 U.S.C.section 360bbb-3(b)(1), unless the authorization is terminated  or revoked sooner.       Influenza A by PCR NEGATIVE NEGATIVE Final    Influenza B by PCR NEGATIVE NEGATIVE Final    Comment: (NOTE) The Xpert Xpress SARS-CoV-2/FLU/RSV plus assay is intended as an aid in the diagnosis of influenza from Nasopharyngeal swab specimens and should not be used as a sole basis for treatment. Nasal washings and aspirates are unacceptable for Xpert Xpress SARS-CoV-2/FLU/RSV testing.  Fact Sheet for Patients: BloggerCourse.com  Fact Sheet for Healthcare Providers: SeriousBroker.it  This test is not yet approved or cleared by the [place] FDA and has been authorized for detection and/or diagnosis of SARS-CoV-2 by FDA under an Emergency Use Authorization (EUA). This EUA will remain in effect (meaning this test can be used) for the duration of the COVID-19 declaration under Section 564(b)(1) of the Act, 21 U.S.C. section 360bbb-3(b)(1), unless the authorization is terminated or revoked.  Performed at [facility], [address] [place], [place] [address]   MRSA Next Gen by PCR, Nasal     Status: None   Collection Time: 05/31/21  7:00 AM   Specimen: Nasal Mucosa; Nasal Swab  Result Value Ref Range Status   MRSA by PCR Next Gen NOT DETECTED NOT DETECTED Final    Comment: (NOTE) The GeneXpert MRSA Assay (FDA approved for NASAL specimens only), is one component of a comprehensive MRSA colonization surveillance program. It is not intended to diagnose MRSA infection nor to guide or monitor treatment for MRSA infections. Test performance is not FDA approved in patients less than 27 years old. Performed at [facility], [address] [place], [place] [address]      Labs: BNP (last 3 results) Recent Labs    05/30/21 1243  BNP 6.1   Basic Metabolic Panel: Recent Labs  Lab 05/30/21 1243 05/31/21 0743 06/01/21 0405  NA 136 137 130*  K 4.5 4.1 4.7  CL 105 101 92*  CO2 GLUCOSE 179* 187* 356*  BUN 24* 22* 27*  CREATININE 0.98 1.00  1.26*  CALCIUM 9.1 9.1 9.4  MG  --  2.2  --    Liver Function Tests: Recent Labs  Lab 05/30/21 1243  AST 24  ALT 29  ALKPHOS 54  BILITOT 0.7  PROT 7.6  ALBUMIN 4.0   No results for input(s): LIPASE, AMYLASE in the last 168 hours. No results for input(s): AMMONIA in the last 168 hours. CBC: Recent Labs  Lab 05/30/21 1243 05/31/21 0743 06/01/21 0405  WBC 7.7 8.7 9.7  NEUTROABS 4.5  --   --   HGB 13.7 14.3 14.1  HCT 41.5 43.2 42.3  MCV 95.6 94.9 95.5  PLT 170 199 242   Cardiac Enzymes: No results for input(s): CKTOTAL, CKMB, CKMBINDEX, TROPONINI in the last 168 hours. BNP: Invalid input(s): POCBNP CBG: Recent Labs  Lab 05/31/21 1634 05/31/21 2104 06/01/21 0922 06/01/21 1101 06/01/21 1104  GLUCAP 196* 322* 340* 370* 368*   D-Dimer Recent Labs    05/31/21 0743  DDIMER 1.27*   Hgb A1c Recent Labs    05/31/21 0743  HGBA1C 6.3*   Lipid Profile No results for input(s): CHOL, HDL, LDLCALC, TRIG, CHOLHDL, LDLDIRECT in the last 72 hours. Thyroid function studies No results for input(s): TSH, T4TOTAL, T3FREE, THYROIDAB in the last 72 hours.  Invalid input(s): FREET3 Anemia work up No results for input(s): VITAMINB12, FOLATE, FERRITIN, TIBC, IRON, RETICCTPCT in the last 72 hours. Urinalysis    Component Value Date/Time   COLORURINE YELLOW 05/30/2021 1243   APPEARANCEUR CLEAR 05/30/2021 1243   APPEARANCEUR Clear 03/10/2014 1305   LABSPEC 1.025 05/30/2021 1243   LABSPEC 1.028 03/10/2014 1305   PHURINE 5.5 05/30/2021 1243   GLUCOSEU 500 (A) 05/30/2021 1243   GLUCOSEU >=500 03/10/2014 1305   HGBUR NEGATIVE 05/30/2021 1243   BILIRUBINUR NEGATIVE 05/30/2021 1243   BILIRUBINUR Negative 03/10/2014 1305   KETONESUR NEGATIVE 05/30/2021 1243   PROTEINUR NEGATIVE 05/30/2021 1243   UROBILINOGEN 1.0 10/12/2009 1213   NITRITE NEGATIVE 05/30/2021 1243   LEUKOCYTESUR NEGATIVE 05/30/2021 1243   LEUKOCYTESUR Negative 03/10/2014 1305   Sepsis Labs Invalid input(s):  PROCALCITONIN,  WBC,  LACTICIDVEN Microbiology Recent Results (from the past 240 hour(s))  Resp Panel by RT-PCR (Flu A&B, Covid) Nasopharyngeal Swab     Status: None   Collection Time: 05/30/21  5:30 PM   Specimen: Nasopharyngeal Swab; Nasopharyngeal(NP) swabs in vial transport medium  Result Value Ref Range Status   SARS Coronavirus 2 by RT PCR NEGATIVE NEGATIVE Final    Comment: (NOTE) SARS-CoV-2 target nucleic acids are NOT DETECTED.  The SARS-CoV-2 RNA is generally detectable in upper respiratory specimens during the acute phase of infection. The lowest concentration of SARS-CoV-2 viral copies this assay can detect is 138 copies/mL. A negative result does not preclude SARS-Cov-2 infection and should not be used as the sole basis for treatment or other patient management decisions. A negative result may occur with  improper specimen collection/handling, submission of specimen other than nasopharyngeal swab, presence of viral mutation(s) within the areas targeted by this assay, and inadequate number of viral copies(<138 copies/mL). A negative result must be combined with clinical observations, patient history, and epidemiological information. The expected result is Negative.  Fact Sheet for Patients:  BloggerCourse.com  Fact Sheet for Healthcare Providers:  SeriousBroker.it  This test is no t yet approved or cleared by the [place] FDA and  has been authorized for detection and/or diagnosis of SARS-CoV-2 by FDA under an Emergency Use Authorization (EUA).  This EUA will remain  in effect (meaning this test can be used) for the duration of the COVID-19 declaration under Section 564(b)(1) of the Act, 21 U.S.C.section 360bbb-3(b)(1), unless the authorization is terminated  or revoked sooner.       Influenza A by PCR NEGATIVE NEGATIVE Final   Influenza B by PCR NEGATIVE NEGATIVE Final    Comment: (NOTE) The Xpert Xpress  SARS-CoV-2/FLU/RSV plus assay is intended as an aid in the diagnosis of influenza from Nasopharyngeal swab specimens and should not be used as a sole basis for treatment. Nasal washings and aspirates are unacceptable for Xpert Xpress SARS-CoV-2/FLU/RSV testing.  Fact Sheet for Patients: BloggerCourse.com  Fact Sheet for Healthcare Providers: SeriousBroker.it  This test is not yet approved or cleared by the [place] FDA and has been authorized for detection and/or diagnosis of SARS-CoV-2 by FDA under an Emergency Use Authorization (EUA). This EUA will remain in effect (meaning this test can be used) for the duration of the COVID-19 declaration under Section 564(b)(1) of the Act, 21 U.S.C. section 360bbb-3(b)(1), unless the authorization is terminated or revoked.  Performed at [facility], [address] [place], [place] [address]   MRSA Next Gen by PCR, Nasal     Status: None   Collection Time: 05/31/21  7:00 AM   Specimen: Nasal Mucosa; Nasal Swab  Result Value Ref Range Status   MRSA by PCR Next Gen NOT DETECTED NOT DETECTED Final    Comment: (NOTE) The GeneXpert MRSA Assay (FDA approved for NASAL specimens only), is one component of a comprehensive MRSA colonization surveillance program. It is not intended to diagnose MRSA infection nor to guide or monitor treatment for MRSA infections. Test performance is not FDA approved in patients less than 36 years old. Performed at [facility], [address] [place], [place] [address]     Time coordinating discharge: Over 30 minutes  SIGNED:  Arnetha Courser, MD  [facility] 06/01/2021, 3:43 PM  If 7PM-7AM, please contact night-coverage [email or web address]  This record has been created using Conservation officer, historic buildings. Errors have been sought and corrected,but may not always be located. Such creation errors do not reflect on the standard of  care.

## 2021-07-04 ENCOUNTER — Other Ambulatory Visit: Payer: Self-pay

## 2021-07-04 ENCOUNTER — Encounter: Payer: Self-pay | Admitting: Emergency Medicine

## 2021-07-04 ENCOUNTER — Emergency Department
Admission: EM | Admit: 2021-07-04 | Discharge: 2021-07-05 | Disposition: A | Discharge status: Home / Self Care | Payer: Medicare Other | Attending: Emergency Medicine | Admitting: Emergency Medicine

## 2021-07-04 DIAGNOSIS — E1140 Type 2 diabetes mellitus with diabetic neuropathy, unspecified: Secondary | ICD-10-CM | POA: Diagnosis not present

## 2021-07-04 DIAGNOSIS — I10 Essential (primary) hypertension: Secondary | ICD-10-CM | POA: Insufficient documentation

## 2021-07-04 DIAGNOSIS — R000 Tachycardia, unspecified: Secondary | ICD-10-CM | POA: Insufficient documentation

## 2021-07-04 DIAGNOSIS — L03115 Cellulitis of right lower limb: Secondary | ICD-10-CM | POA: Insufficient documentation

## 2021-07-04 DIAGNOSIS — M79604 Pain in right leg: Secondary | ICD-10-CM | POA: Diagnosis present

## 2021-07-04 DIAGNOSIS — I890 Lymphedema, not elsewhere classified: Secondary | ICD-10-CM | POA: Diagnosis not present

## 2021-07-04 NOTE — ED Triage Notes (Signed)
Pt arrived via POV with reports of bilateral leg and foot pain, pt states the pain feels like multiple bees stinging him. Pt has hx of diabetes, pt admitted to the hospital around [place] time for the same. Pt has open wound to L lower leg that pt states is worse since hospitalization.  Pt has not followed up since hospital admission. No meds taken for the pain. Pt is ambulatory with a cane.   Pt states the pain is worse tonight.

## 2021-07-04 NOTE — ED Notes (Signed)
2 IV attempts made 1 to R AC and 1 to L AC both unsuccessful. Able to get 1 set of blood cultures from [facility]

## 2021-07-05 ENCOUNTER — Emergency Department: Payer: Medicare Other

## 2021-07-05 LAB — COMPREHENSIVE METABOLIC PANEL
ALT: 26 U/L (ref 0–44)
AST: 28 U/L (ref 15–41)
Albumin: 3.8 g/dL (ref 3.5–5.0)
Alkaline Phosphatase: 43 U/L (ref 38–126)
Anion gap: 8 (ref 5–15)
BUN: 22 mg/dL — ABNORMAL HIGH (ref 6–20)
CO2: 29 mmol/L (ref 22–32)
Calcium: 9.1 mg/dL (ref 8.9–10.3)
Chloride: 101 mmol/L (ref 98–111)
Creatinine, Ser: 1.26 mg/dL — ABNORMAL HIGH (ref 0.61–1.24)
GFR, Estimated: >60 mL/min (ref >60)
Glucose, Bld: 118 mg/dL — ABNORMAL HIGH (ref 70–99)
Potassium: 4.7 mmol/L (ref 3.5–5.1)
Sodium: 138 mmol/L (ref 135–145)
Total Bilirubin: 0.6 mg/dL (ref 0.3–1.2)
Total Protein: 7.2 g/dL (ref 6.5–8.1)

## 2021-07-05 LAB — CBC WITH DIFFERENTIAL/PLATELET
Abs Immature Granulocytes: 0.06 10*3/uL (ref 0.00–0.07)
Basophils Absolute: 0.1 10*3/uL (ref 0.0–0.1)
Basophils Relative: 1 %
Eosinophils Absolute: 0.1 10*3/uL (ref 0.0–0.5)
Eosinophils Relative: 1 %
HCT: 40.7 % (ref 39.0–52.0)
Hemoglobin: 13.4 g/dL (ref 13.0–17.0)
Immature Granulocytes: 1 %
Lymphocytes Relative: 25 %
Lymphs Abs: 2.1 10*3/uL (ref 0.7–4.0)
MCH: 32.4 pg (ref 26.0–34.0)
MCHC: 32.9 g/dL (ref 30.0–36.0)
MCV: 98.5 fL (ref 80.0–100.0)
Monocytes Absolute: 0.8 10*3/uL (ref 0.1–1.0)
Monocytes Relative: 10 %
Neutro Abs: 5.5 10*3/uL (ref 1.7–7.7)
Neutrophils Relative %: 62 %
Platelets: 162 10*3/uL (ref 150–400)
RBC: 4.13 MIL/uL — ABNORMAL LOW (ref 4.22–5.81)
RDW: 13.9 % (ref 11.5–15.5)
WBC: 8.6 10*3/uL (ref 4.0–10.5)
nRBC: 0 % (ref 0.0–0.2)

## 2021-07-05 LAB — PROTIME-INR
INR: 1.1 (ref 0.8–1.2)
Prothrombin Time: 13.9 seconds (ref 11.4–15.2)

## 2021-07-05 LAB — LACTIC ACID, PLASMA: Lactic Acid, Venous: 1.3 mmol/L (ref 0.5–1.9)

## 2021-07-05 MED ORDER — AMOXICILLIN-POT CLAVULANATE 875-125 MG PO TABS
1.0000 | ORAL_TABLET | Freq: Once | ORAL | Status: AC
Start: 2021-07-05 — End: 2021-07-05
  Administered 2021-07-05: 1 via ORAL
  Filled 2021-07-05: qty 1

## 2021-07-05 MED ORDER — OXYCODONE HCL 5 MG PO TABS: 5.0000 mg | ORAL_TABLET | Freq: Four times a day (QID) | ORAL | 0 refills | Status: AC | PRN

## 2021-07-05 MED ORDER — AMOXICILLIN-POT CLAVULANATE 875-125 MG PO TABS: 1.0000 | ORAL_TABLET | Freq: Two times a day (BID) | ORAL | 0 refills | Status: AC

## 2021-07-05 MED ORDER — OXYCODONE-ACETAMINOPHEN 5-325 MG PO TABS
2.0000 | ORAL_TABLET | Freq: Once | ORAL | Status: AC
  Administered 2021-07-05: 2 via ORAL
  Filled 2021-07-05: qty 2

## 2021-07-05 MED ORDER — PREGABALIN 25 MG PO CAPS: 25.0000 mg | ORAL_CAPSULE | Freq: Three times a day (TID) | ORAL | 0 refills | Status: DC

## 2021-07-05 MED ORDER — DOXYCYCLINE MONOHYDRATE 100 MG PO TABS: 100.0000 mg | ORAL_TABLET | Freq: Two times a day (BID) | ORAL | 0 refills | Status: AC

## 2021-07-05 MED ORDER — DOXYCYCLINE HYCLATE 100 MG PO TABS
100.0000 mg | ORAL_TABLET | Freq: Once | ORAL | Status: AC
Start: 2021-07-05 — End: 2021-07-05
  Administered 2021-07-05: 100 mg via ORAL
  Filled 2021-07-05: qty 1

## 2021-07-05 MED ORDER — SODIUM CHLORIDE 0.9 % IV BOLUS
1000.0000 mL | Freq: Once | INTRAVENOUS | Status: AC
  Administered 2021-07-05: 1000 mL via INTRAVENOUS

## 2021-07-05 NOTE — ED Notes (Signed)
Pt discharge information reviewed. Pt understands need for follow up care and when to return if symptoms worsen. All questions answered. Pt is alert and oriented with even and regular respirations. Pt is seen ambulating out of department with string steady gait.   

## 2021-07-05 NOTE — Discharge Instructions (Addendum)
Please start taking 2 antibiotics, Augmentin and doxycycline twice daily for the next 7 days for the cellulitis.  For your pain I have written you a short course of oxycodone which you can take for breakthrough pain and you can start taking the Lyrica.  Please stop taking the gabapentin as you should not combine gabapentin and Lyrica together.  Please try to keep your legs elevated especially when you are sleeping as if you are having them dangling this is going to continue to contribute to worsening swelling and worsening wound healing.  It is very important that you get into see [facility], I have put in a referral, please call them tomorrow to schedule an appointment.  Please also follow-up with podiatry who can help with debridement of your chronic ulcers in the vascular vein specialist.  Please return to the emergency department if you develop fever or chills.

## 2021-07-05 NOTE — ED Provider Notes (Signed)
[facility] Provider Note    Event Date/Time   First MD Initiated Contact with Patient 07/04/21 2337     (approximate)   History   Foot Pain and Wound Infection   HPI  Martin Riddle is a 51 y.o. male with past medical history of diabetes with peripheral neuropathy, hypertension hyperlipidemia who presents with bilateral lower extremity pain.  Patient notes that he had a similar issue before Christmas and he was hospitalized for bilateral cellulitis.  Was doing better initially after he completed the IV and p.o. course of antibiotics but then pain has been worsening again.  Pain is worse in the right leg which is more swollen compared to the left.  He has had drainage from them both as well.  Denies fevers chills.  Denies shortness of breath or chest pain.    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   Congenital flat foot 01/16/2015   DDD (degenerative disc disease), lumbar 01/16/2015   Diabetes mellitus, type 2 (HCC) 01/16/2015   Hypercholesteremia 01/16/2015   Gastro-esophageal reflux disease without esophagitis 01/16/2015   Peripheral neuropathy 01/16/2015     Physical Exam  Triage Vital Signs: ED Triage Vitals  Enc Vitals Group     BP 07/04/21 2309 (!) 161/83     Pulse Rate 07/04/21 2309 (!) 114     Resp 07/04/21 2309 (!) 22     Temp 07/04/21 2309 98.8 F (37.1 C)     Temp Source 07/04/21 2309 Oral     SpO2 07/04/21 2309 92 %     Weight 07/04/21 2310 (!) 325 lb (147.4 kg)     Height 07/04/21 2310 6' 4.75"  (1.949 m)     Head Circumference --      Peak Flow --      Pain Score 07/04/21 2306 9     Pain Loc --      Pain Edu? --      Excl. in GC? --     Most recent vital signs: Vitals:   07/05/21 0300 07/05/21 0405  BP: 102/73 (!) 162/137  Pulse: (!) 102 (!) 105  Resp: 14 16  Temp:    SpO2: 91% 94%     General: Awake, no distress.  Obese CV:  Good peripheral perfusion.  Resp:  Normal effort.  Abd:  No distention.  Neuro:             Awake, Alert, Oriented x 3  Other:          ED Results / Procedures / Treatments  Labs (all labs ordered are listed, but only abnormal results are displayed) Labs Reviewed  COMPREHENSIVE METABOLIC PANEL - Abnormal; Notable for the following components:      Result Value   Glucose, Bld 118 (*)    BUN 22 (*)    Creatinine, Ser 1.26 (*)    All other components within normal limits  CBC WITH DIFFERENTIAL/PLATELET - Abnormal; Notable for the following components:   RBC 4.13 (*)    All other components within normal limits  CULTURE, BLOOD (ROUTINE X 2)  CULTURE, BLOOD (ROUTINE X 2)  LACTIC ACID, PLASMA  PROTIME-INR  LACTIC ACID, PLASMA  URINALYSIS, ROUTINE W REFLEX MICROSCOPIC     EKG  EKG interpreted by myself, sinus tachycardia, normal axis normal intervals no acute ischemic changes   RADIOLOGY Reviewed the DVT ultrasound which is negative for DVT   PROCEDURES:  Critical Care performed: No   MEDICATIONS ORDERED IN ED: Medications  amoxicillin-clavulanate (AUGMENTIN) 875-125 MG per tablet 1 tablet (has no administration in time range)  doxycycline (VIBRA-TABS) tablet 100 mg (has no administration in time range)  oxyCODONE-acetaminophen (PERCOCET/ROXICET) 5-325 MG per tablet 2 tablet (2 tablets Oral Given 07/05/21 0211)  sodium chloride 0.9 % bolus 1,000 mL (1,000 mLs Intravenous New Bag/Given 07/05/21 0221)     IMPRESSION / MDM / ASSESSMENT AND PLAN / ED COURSE  I reviewed the triage vital signs and the nursing notes.                               Differential diagnosis includes, but is not limited to, cellulitis, DVT, chronic lymphedema, venous stasis  Patient is a 51 year old male who presents with bilateral lower extremity swelling redness and pain.  He was recently admitted for this back in December where he was treated for cellulitis and was evaluated by podiatry who felt that his chronic ulcerations were mostly in the setting of chronic lymphedema.  He had ABIs at that time which were normal.  He was doing better after hospitalization initially but now his swelling and pain are worsening again.   Notes that he has chronic pain due to neuropathy but the pain is worse now and his legs are more swollen.  He has no respiratory symptoms.  No fevers or other systemic symptoms.  On exam the right leg is more swollen than the left and there are chronic appearing ulcerations and skin changes consistent with stasis dermatitis.  The right leg is slightly more erythematous and I do feel like there is likely component of bacterial superinfection primarily of the right leg.    Patient's heart rate is mildly elevated in the 110s.  Laboratory work was reviewed, he has no leukocytosis, no signs of endorgan damage on BMP including creatinine which is at baseline blood sugar relatively well controlled and his lactate is normal.  I reviewed the patient's last admission and there were strong recommendations for podiatry for wound care follow-up and for Unna boots to help with his chronic lymphedema which would ultimately help with wound healing. Patient fortunately was not able to make it to his wound care appointment because he was feeling sick and then was told after that that he would need to have another referral noted to be seen so he has not been getting wound care.  Patient also tells me that he sleeps in a chair because his bed gives him anxiety so his legs are dangling all night which is also likely contributing to his lymphedema.  I  have low suspicion for CHF exacerbation as his EF was normal on most recent echo and BNP also was normal during his last admission.  With asymmetry of his legs do feel like DVT should be ruled out so I ordered a DVT ultrasound, which is negative.    I suspect that there is cellulitis of the right leg but the overlying issue is his chronic lymphedema and chronic ulcerations.  Most important thing for him is  that he gets wound care including the Unna boots and good podiatry and vascular follow-up.  These were all commended at the time of his most recent discharge but unfortunately has not been able to follow through with these.  Do not think that he needs IV antibiotics at this time given he is not showing signs of sepsis.  Heart rate was mildly elevated while in the ED and I reviewed his recent hospitalization his heart rate was also persistently elevated at that time.  He has again no leukocytosis or no elevated lactate to suggest sepsis.  In terms of his pain this is been a chronic issue for him was previously taking very high doses of opiates which have been titrated down and currently he has no opiate prescription.  Given he has not been able to see a physician I will write him for 3-day course of oxycodone.  He tells me that the Lyrica was more effective for his neuropathic pain in the past and the gabapentin that he was started on is causing him headaches.  Will discontinue the gabapentin and start him on Lyrica.  I emphasized the importance of wound care and podiatry and vascular follow-up and patient understood.  We will write him for 7-day course of Augmentin and doxycycline which she responded to in the past.  We discussed return precautions for fevers.    FINAL CLINICAL IMPRESSION(S) / ED DIAGNOSES   Final diagnoses:  Cellulitis of right lower extremity  Lymphedema     Rx / DC Orders   ED Discharge Orders          Ordered    amoxicillin-clavulanate (AUGMENTIN) 875-125 MG tablet  2 times  daily        07/05/21 0429    doxycycline (ADOXA) 100 MG tablet  2 times daily        07/05/21 0429    pregabalin (LYRICA) 25 MG capsule  3 times daily        07/05/21 0429    oxyCODONE (ROXICODONE) 5 MG immediate release tablet  Every 6 hours PRN        07/05/21 0429    AMB referral to wound care center        07/05/21 0433             Note:  This document was prepared using Dragon voice recognition software and may include unintentional dictation errors.   Georga Hacking, MD 07/05/21 [phone]

## 2021-07-05 NOTE — ED Notes (Signed)
Pts legs wrapped with nonadherent dressing and curlex

## 2021-07-07 ENCOUNTER — Other Ambulatory Visit: Payer: Self-pay

## 2021-07-07 ENCOUNTER — Emergency Department
Admission: EM | Admit: 2021-07-07 | Discharge: 2021-07-07 | Disposition: A | Discharge status: Home / Self Care | Payer: Medicare Other | Attending: Emergency Medicine | Admitting: Emergency Medicine

## 2021-07-07 ENCOUNTER — Encounter: Payer: Self-pay | Admitting: Emergency Medicine

## 2021-07-07 ENCOUNTER — Telehealth: Payer: Self-pay

## 2021-07-07 DIAGNOSIS — R000 Tachycardia, unspecified: Secondary | ICD-10-CM | POA: Diagnosis not present

## 2021-07-07 DIAGNOSIS — E1142 Type 2 diabetes mellitus with diabetic polyneuropathy: Secondary | ICD-10-CM | POA: Diagnosis not present

## 2021-07-07 DIAGNOSIS — Z6841 Body Mass Index (BMI) 40.0 and over, adult: Secondary | ICD-10-CM | POA: Diagnosis not present

## 2021-07-07 DIAGNOSIS — I890 Lymphedema, not elsewhere classified: Secondary | ICD-10-CM | POA: Insufficient documentation

## 2021-07-07 DIAGNOSIS — Z794 Long term (current) use of insulin: Secondary | ICD-10-CM | POA: Diagnosis not present

## 2021-07-07 DIAGNOSIS — L03115 Cellulitis of right lower limb: Secondary | ICD-10-CM | POA: Diagnosis not present

## 2021-07-07 LAB — CBC WITH DIFFERENTIAL/PLATELET
Abs Immature Granulocytes: 0.05 K/uL (ref 0.00–0.07)
Basophils Absolute: 0 K/uL (ref 0.0–0.1)
Basophils Relative: 0 %
Eosinophils Absolute: 0.1 K/uL (ref 0.0–0.5)
Eosinophils Relative: 1 %
HCT: 43.1 % (ref 39.0–52.0)
Hemoglobin: 14.2 g/dL (ref 13.0–17.0)
Immature Granulocytes: 1 %
Lymphocytes Relative: 28 %
Lymphs Abs: 2 K/uL (ref 0.7–4.0)
MCH: 32.2 pg (ref 26.0–34.0)
MCHC: 32.9 g/dL (ref 30.0–36.0)
MCV: 97.7 fL (ref 80.0–100.0)
Monocytes Absolute: 0.6 K/uL (ref 0.1–1.0)
Monocytes Relative: 8 %
Neutro Abs: 4.3 K/uL (ref 1.7–7.7)
Neutrophils Relative %: 62 %
Platelets: 185 K/uL (ref 150–400)
RBC: 4.41 MIL/uL (ref 4.22–5.81)
RDW: 13.7 % (ref 11.5–15.5)
WBC: 6.9 K/uL (ref 4.0–10.5)
nRBC: 0 % (ref 0.0–0.2)

## 2021-07-07 LAB — BLOOD CULTURE ID PANEL (REFLEXED) - BCID2

## 2021-07-07 LAB — COMPREHENSIVE METABOLIC PANEL
ALT: 33 U/L (ref 0–44)
AST: 30 U/L (ref 15–41)
Albumin: 4.3 g/dL (ref 3.5–5.0)
Alkaline Phosphatase: 49 U/L (ref 38–126)
Anion gap: 9 (ref 5–15)
BUN: 21 mg/dL — ABNORMAL HIGH (ref 6–20)
CO2: 31 mmol/L (ref 22–32)
Calcium: 10.1 mg/dL (ref 8.9–10.3)
Chloride: 95 mmol/L — ABNORMAL LOW (ref 98–111)
Creatinine, Ser: 0.93 mg/dL (ref 0.61–1.24)
GFR, Estimated: >60 mL/min (ref >60)
Glucose, Bld: 117 mg/dL — ABNORMAL HIGH (ref 70–99)
Potassium: 4.2 mmol/L (ref 3.5–5.1)
Sodium: 135 mmol/L (ref 135–145)
Total Bilirubin: 0.7 mg/dL (ref 0.3–1.2)
Total Protein: 7.9 g/dL (ref 6.5–8.1)

## 2021-07-07 LAB — LACTIC ACID, PLASMA: Lactic Acid, Venous: 2.3 mmol/L (ref 0.5–1.9)

## 2021-07-07 NOTE — ED Notes (Signed)
Reviewed chart and + culture result with MD Paduchowski. Per MD call pt and advise to come back in for further care. Attempted to call pt and left voicemail on phone and on wifes voicemail. All attempts documented.

## 2021-07-07 NOTE — ED Triage Notes (Signed)
Pt reports was seen on 1/27 and d/c home. Pt states his wife received a call that he needed to come back and be treated for abnormal labs. Pt c/o continued pain in both feet and legs.

## 2021-07-07 NOTE — ED Notes (Signed)
Pt's BLE redressed with xeroform gauze, kerlix, and ace bandages.

## 2021-07-07 NOTE — ED Provider Notes (Signed)
[facility] Provider Note    Event Date/Time   First MD Initiated Contact with Patient 07/07/21 1950     (approximate)   History   Abnormal Lab   HPI  Martin Riddle is a 51 y.o. male with history of diabetes peripheral neuropathy a lymphedema and venous stasis of bilateral lower extremities who comes ED due to abnormal labs.  Review of records shows the when he was here 3 days ago, he had blood cultures drawn.  1 aerobic bottle was positive for gram-positive organisms, but reflex ID panel was negative for all pathogens.  I discussed this result with the lab who notes that the positive bottle was the first of the 4 drawn, highly suspicious for a skin contaminant.  The other 3 bottles were negative.  Additionally, they positively IDed the resultant organism and confirmed that it is skin flora.  Patient denies any change in his symptomatology.  No worsening pain, no fever.  He is tolerating oral intake and taking antibiotics.  He has an adequate pain control with the oxycodone, but he does have chronic peripheral neuropathy.  No acute symptoms.  He had ultrasound bilateral lower extremities 3 days ago which were negative for DVT.     Physical Exam   Triage Vital Signs: ED Triage Vitals  Enc Vitals Group     BP --      Pulse Rate 07/07/21 1904 (!) 108     Resp 07/07/21 1904 20     Temp 07/07/21 1904 98.2 F (36.8 C)     Temp Source 07/07/21 1904 Oral     SpO2 07/07/21 1904 92 %     Weight 07/07/21 1851 (!) 330 lb 11 oz (150 kg)     Height 07/07/21 1851 6\' 4"  (1.93 m)     Head Circumference --      Peak Flow --      Pain Score 07/07/21 1851 8     Pain Loc --      Pain Edu? --      Excl. in GC? --     Most recent vital signs: Vitals:   07/07/21 1904  Pulse: (!) 108  Resp: 20  Temp: 98.2 F (36.8 C)  SpO2: 92%     General: Awake, no distress.  CV:  Good peripheral perfusion.  Tachycardia heart rate 105 Resp:  Normal effort.   Abd:  No distention.  Soft nontender Other:  Swelling and chronic skin changes bilateral lower extremities consistent with venous stasis dermatitis and lymphedema.  He has some scattered superficial wounds on bilateral extremities, right greater than left.  Neither is warm to the touch.  He has good distal perfusion.  Trace pitting edema.  No crepitus.  No purulent drainage.  Symmetric calf circumference.   ED Results / Procedures / Treatments   Labs (all labs ordered are listed, but only abnormal results are displayed) Labs Reviewed  LACTIC ACID, PLASMA - Abnormal; Notable for the following components:      Result Value   Lactic Acid, Venous 2.3 (*)    All other components within normal limits  COMPREHENSIVE METABOLIC PANEL - Abnormal; Notable for the following components:   Chloride 95 (*)    Glucose, Bld 117 (*)    BUN 21 (*)    All other components within normal limits  CULTURE, BLOOD (ROUTINE X 2)  CULTURE, BLOOD (ROUTINE X 2)  CBC WITH DIFFERENTIAL/PLATELET  LACTIC ACID, PLASMA     EKG  RADIOLOGY     PROCEDURES:  Critical Care performed: No  Procedures   MEDICATIONS ORDERED IN ED: Medications - No data to display   IMPRESSION / MDM / ASSESSMENT AND PLAN / ED COURSE  I reviewed the triage vital signs and the nursing notes.                                   Patient called back to the ED due to abnormal blood culture result.  This was found to be erroneous/contaminant.  He has no worsening of symptoms.  Vital signs are unremarkable, no leukocytosis.  Normal chemistry panel.  As noted by Dr. Sidney Ace during her evaluation 3 days ago, patient has longstanding chronic pain from peripheral neuropathy and lymphedema.  He also has baseline tachycardia.  He is nontoxic, no signs of sepsis.  Repeat cultures drawn today.  Stable for discharge to continue his oral antibiotics.  He is in the process of establishing care with the wound care center and states that  someone is coming to his house to evaluate.      FINAL CLINICAL IMPRESSION(S) / ED DIAGNOSES   Final diagnoses:  Lymphedema  Cellulitis of leg, right  Type 2 diabetes mellitus with diabetic polyneuropathy, with long-term current use of insulin (HCC)  Morbid obesity (HCC)     Rx / DC Orders   ED Discharge Orders     None        Note:  This document was prepared using Dragon voice recognition software and may include unintentional dictation errors.   Sharman Cheek, MD 07/07/21 2113

## 2021-07-10 LAB — CULTURE, BLOOD (ROUTINE X 2)
Culture: NO GROWTH
Special Requests: ADEQUATE

## 2021-07-12 LAB — CULTURE, BLOOD (ROUTINE X 2)
Culture: NO GROWTH
Culture: NO GROWTH

## 2021-08-02 ENCOUNTER — Encounter (INDEPENDENT_AMBULATORY_CARE_PROVIDER_SITE_OTHER): Payer: Medicare Other | Admitting: Vascular Surgery

## 2021-09-26 ENCOUNTER — Encounter: Payer: Medicare Other | Attending: Physician Assistant | Admitting: Physician Assistant

## 2021-09-26 DIAGNOSIS — E11621 Type 2 diabetes mellitus with foot ulcer: Secondary | ICD-10-CM | POA: Diagnosis not present

## 2021-09-26 DIAGNOSIS — L97522 Non-pressure chronic ulcer of other part of left foot with fat layer exposed: Secondary | ICD-10-CM | POA: Diagnosis not present

## 2021-09-26 DIAGNOSIS — E11622 Type 2 diabetes mellitus with other skin ulcer: Secondary | ICD-10-CM | POA: Diagnosis not present

## 2021-09-26 DIAGNOSIS — I872 Venous insufficiency (chronic) (peripheral): Secondary | ICD-10-CM | POA: Diagnosis not present

## 2021-09-26 DIAGNOSIS — I10 Essential (primary) hypertension: Secondary | ICD-10-CM | POA: Diagnosis not present

## 2021-09-26 DIAGNOSIS — L97412 Non-pressure chronic ulcer of right heel and midfoot with fat layer exposed: Secondary | ICD-10-CM | POA: Insufficient documentation

## 2021-09-26 DIAGNOSIS — L97822 Non-pressure chronic ulcer of other part of left lower leg with fat layer exposed: Secondary | ICD-10-CM | POA: Insufficient documentation

## 2021-09-26 DIAGNOSIS — I890 Lymphedema, not elsewhere classified: Secondary | ICD-10-CM | POA: Diagnosis not present

## 2021-09-27 NOTE — Progress Notes (Signed)
KENLEY, TROOP ([id number]) ?Visit Report for 09/26/2021 ?Chief Complaint Document Details ?Patient Name: Martin Riddle, Martin Riddle ?Date of Service: 09/26/2021 8:45 AM ?Medical Record Number: [id number] ?Patient Account Number: [id number] ?Date of Birth/Sex: [birth year] (51 y.o. M) ?Treating RN: [name] ?Primary Care Provider: Marletta Lor Other Clinician: ?Referring Provider: Rosetta Posner ?Treating Provider/Extender: Gleghorn Derry ?Weeks in Treatment: 0 ?Information Obtained from: Patient ?Chief Complaint ?Bilateral foot ulcers and left leg ulcer ?Electronic Signature(s) ?Signed: 09/26/2021 9:51:03 AM By: Lenda Kelp PA-C ?Entered By: Lenda Kelp on 09/26/2021 09:51:03 ?KEYDEN, PAVLOV ([id number]) ?-------------------------------------------------------------------------------- ?Debridement Details ?Patient Name: Martin, Riddle ?Date of Service: 09/26/2021 8:45 AM ?Medical Record Number: [id number] ?Patient Account Number: [id number] ?Date of Birth/Sex: 22-Feb-1971 (51 y.o. M) ?Treating RN: [name] ?Primary Care Provider: Marletta Lor Other Clinician: ?Referring Provider: Rosetta Posner ?Treating Provider/Extender: Yoho Derry ?Weeks in Treatment: 0 ?Debridement Performed for ?Wound #1 Right Calcaneus ?Assessment: ?Performed By: Physician Nelida Meuse., PA-C ?Debridement Type: Chemical/Enzymatic/Mechanical ?Agent Used: Saline and gauze ?Severity of Tissue Pre Debridement: Fat layer exposed ?Level of Consciousness (Pre- ?Awake and Alert ?procedure): ?Pre-procedure Verification/Time Out ?Yes - 10:00 ?Taken: ?Start Time: 10:00 ?Instrument: ?Other : saline and gauze ?Bleeding: Minimum ?End Time: 10:02 ?Procedural Pain: 0 ?Post Procedural Pain: 0 ?Response to Treatment: Procedure was tolerated well ?Level of Consciousness (Post- ?Awake and Alert ?procedure): ?Post Debridement Measurements of Total Wound ?Length: (cm) 1 ?Width: (cm) 2 ?Depth: (cm) 0.1 ?Volume: (cm?) 0.157 ?Character of Wound/Ulcer Post Debridement:  Improved ?Severity of Tissue Post Debridement: Fat layer exposed ?Post Procedure Diagnosis ?Same as Pre-procedure ?Electronic Signature(s) ?Signed: 09/27/2021 4:04:50 PM By: [name] RN ?Signed: 09/27/2021 5:32:35 PM By: Lenda Kelp PA-C ?Entered By: [name] on 09/26/2021 10:03:08 ?DONOVAN, PERSLEY ([id number]) ?-------------------------------------------------------------------------------- ?Debridement Details ?Patient Name: Martin, Riddle ?Date of Service: 09/26/2021 8:45 AM ?Medical Record Number: [id number] ?Patient Account Number: [id number] ?Date of Birth/Sex: 09-04-1970 (51 y.o. M) ?Treating RN: [name] ?Primary Care Provider: Marletta Lor Other Clinician: ?Referring Provider: Rosetta Posner ?Treating Provider/Extender: Malay Derry ?Weeks in Treatment: 0 ?Debridement Performed for ?Wound #2 Left Lower Leg ?Assessment: ?Performed By: Physician Nelida Meuse., PA-C ?Debridement Type: Chemical/Enzymatic/Mechanical ?Agent Used: Saline and gauze ?Severity of Tissue Pre Debridement: Fat layer exposed ?Level of Consciousness (Pre- ?Awake and Alert ?procedure): ?Pre-procedure Verification/Time Out ?Yes - 10:00 ?Taken: ?Start Time: 10:00 ?Instrument: ?Other : saline and gauze ?Bleeding: Minimum ?End Time: 10:02 ?Procedural Pain: 0 ?Post Procedural Pain: 0 ?Response to Treatment: Procedure was tolerated well ?Level of Consciousness (Post- ?Awake and Alert ?procedure): ?Post Debridement Measurements of Total Wound ?Length: (cm) 5 ?Width: (cm) 2 ?Depth: (cm) 0.1 ?Volume: (cm?) 0.785 ?Character of Wound/Ulcer Post Debridement: Improved ?Severity of Tissue Post Debridement: Fat layer exposed ?Post Procedure Diagnosis ?Same as Pre-procedure ?Electronic Signature(s) ?Signed: 09/27/2021 4:04:50 PM By: [name] RN ?Signed: 09/27/2021 5:32:35 PM By: Lenda Kelp PA-C ?Entered By: [name] on 09/26/2021 10:03:41 ?FABIANO, GINLEY  ([id number]) ?-------------------------------------------------------------------------------- ?Debridement Details ?Patient Name: Martin, Riddle ?Date of Service: 09/26/2021 8:45 AM ?Medical Record Number: [id number] ?Patient Account Number: [id number] ?Date of Birth/Sex: Sep 26, 1970 (51 y.o. M) ?Treating RN: [name] ?Primary Care Provider: Marletta Lor Other Clinician: ?Referring Provider: Rosetta Posner ?Treating Provider/Extender: Brayfield Derry ?Weeks in Treatment: 0 ?Debridement Performed for ?Wound #3 Left Metatarsal head fifth ?Assessment: ?Performed By: Physician Nelida Meuse., PA-C ?Debridement Type: Chemical/Enzymatic/Mechanical ?Agent Used: Saline and gauze ?Severity of Tissue Pre Debridement: Fat layer exposed ?Level of Consciousness (Pre- ?Awake and Alert ?procedure): ?Pre-procedure Verification/Time Out ?  Yes - 10:00 ?Taken: ?Start Time: 10:00 ?Instrument: ?Other : saline and gauze ?Bleeding: Minimum ?End Time: 10:02 ?Procedural Pain: 0 ?Post Procedural Pain: 0 ?Response to Treatment: Procedure was tolerated well ?Level of Consciousness (Post- ?Awake and Alert ?procedure): ?Post Debridement Measurements of Total Wound ?Length: (cm) 0.4 ?Width: (cm) 0.6 ?Depth: (cm) 0.2 ?Volume: (cm?) 0.038 ?Character of Wound/Ulcer Post Debridement: Improved ?Severity of Tissue Post Debridement: Fat layer exposed ?Post Procedure Diagnosis ?Same as Pre-procedure ?Electronic Signature(s) ?Signed: 09/27/2021 4:04:50 PM By: [name] RN ?Signed: 09/27/2021 5:32:35 PM By: Lenda Kelp PA-C ?Entered By: [name] on 09/26/2021 10:04:08 ?ROE, WILNER ([id number]) ?-------------------------------------------------------------------------------- ?HPI Details ?Patient Name: Martin, Riddle ?Date of Service: 09/26/2021 8:45 AM ?Medical Record Number: [id number] ?Patient Account Number: [id number] ?Date of Birth/Sex: [birth year] (51 y.o. M) ?Treating RN: [name] ?Primary Care Provider: Marletta Lor Other Clinician: ?Referring  Provider: Rosetta Posner ?Treating Provider/Extender: Morison Derry ?Weeks in Treatment: 0 ?History of Present Illness ?HPI Description: 09-26-2021 upon evaluation today patient presents today for evaluation of wounds on the right heel, left lower leg, and left fifth ?metatarsal head location more plantar. With that being said these wounds have been present for since around June 09, 2021. Currently the ?patient has had dressings on that were unsure exactly how long they have been in place he has legs that appear to be extremely dry and he had a ?lot of dead tissue and dry skin buildup. It does make me concerned about the fact that he has not been changing these dressings appropriately ?nor anywhere close to regularly. Currently with regard to the wounds he does seem to be doing decently well all things considered. That is at least ?good news. I see no signs of obvious active infection at the moment. The patient's most recent ABI was on 05/31/2021 and was on the right 1.15 ?and left 1.02 there was no arterial study performed for completeness nor a TBI. This may be something to consider going forward. ?Patient has otherwise a history of diabetes mellitus type 2, chronic venous insufficiency, lymphedema, and hypertension. ?Electronic Signature(s) ?Signed: 09/27/2021 5:27:06 PM By: Lenda Kelp PA-C ?Previous Signature: 09/27/2021 5:19:33 PM Version By: Lenda Kelp PA-C ?Entered By: Lenda Kelp on 09/27/2021 17:27:06 ?LOWEN, BARRINGER ([id number]) ?-------------------------------------------------------------------------------- ?Physical Exam Details ?Patient Name: JADYN, BARGE ?Date of Service: 09/26/2021 8:45 AM ?Medical Record Number: [id number] ?Patient Account Number: [id number] ?Date of Birth/Sex: 1971-03-04 (51 y.o. M) ?Treating RN: [name] ?Primary Care Provider: Marletta Lor Other Clinician: ?Referring Provider: Rosetta Posner ?Treating Provider/Extender: Greenbaum Derry ?Weeks in Treatment:  0 ?Constitutional ?patient is hypertensive.. pulse regular and within target range for patient.Marland Kitchen respirations regular, non-labored and within target range for patient.. ?temperature within target range for patient.. Obese and well-hydrated in no acute distress. ?Eyes ?conjunctiva clear no eyelid edema noted. pupi

## 2021-09-27 NOTE — Progress Notes (Signed)
Martin Riddle, Martin Riddle ([id number]) ?Visit Report for 09/26/2021 ?Allergy List Details ?Patient Name: Martin Riddle, Martin Riddle ?Date of Service: 09/26/2021 8:45 AM ?Medical Record Number: [id number] ?Patient Account Number: [id number] ?Date of Birth/Sex: 14-Feb-1971 (51 y.o. M) ?Treating RN: [name] ?Primary Care Nadir Vasques: Marletta Lor Other Clinician: ?Referring Tigerlily Christine: Rosetta Posner ?Treating Lewi Drost/Extender: Gockel Derry ?Weeks in Treatment: 0 ?Allergies ?Active Allergies ?morphine ?Allergy Notes ?Electronic Signature(s) ?Signed: 09/27/2021 4:04:50 PM By: [name] RN ?Entered ByYevonne Pax on 09/26/2021 09:07:25 ?Martin Riddle, Martin Riddle ([id number]) ?-------------------------------------------------------------------------------- ?Arrival Information Details ?Patient Name: Martin Riddle, Martin Riddle ?Date of Service: 09/26/2021 8:45 AM ?Medical Record Number: [id number] ?Patient Account Number: [id number] ?Date of Birth/Sex: [birth year] (51 y.o. M) ?Treating RN: [name] ?Primary Care Braxtin Bamba: Marletta Lor Other Clinician: ?Referring Mirian Casco: Rosetta Posner ?Treating Hinley Brimage/Extender: Rames Derry ?Weeks in Treatment: 0 ?Visit Information ?Patient Arrived: Ambulatory ?Arrival Time: 09:01 ?Accompanied By: wife ?Transfer Assistance: None ?Patient Identification Verified: Yes ?Secondary Verification Process Completed: Yes ?Patient Requires Transmission-Based Precautions: No ?Patient Has Alerts: No ?Electronic Signature(s) ?Signed: 09/27/2021 4:04:50 PM By: [name] RN ?Entered By: [name] on 09/26/2021 09:02:01 ?Martin Riddle ([id number]) ?-------------------------------------------------------------------------------- ?Clinic Level of Care Assessment Details ?Patient Name: Martin Riddle, Martin Riddle ?Date of Service: 09/26/2021 8:45 AM ?Medical Record Number: [id number] ?Patient Account Number: [id number] ?Date of Birth/Sex: [birth year] (51 y.o. M) ?Treating RN: [name] ?Primary Care Abdulaziz Toman: Marletta Lor Other Clinician: ?Referring Lemya Greenwell: Rosetta Posner ?Treating Anastasio Wogan/Extender: Fix Derry ?Weeks in Treatment: 0 ?Clinic Level of Care Assessment Items ?TOOL 2 Quantity Score ?X - Use when only an EandM is performed on the INITIAL visit 1 0 ?ASSESSMENTS - Nursing Assessment / Reassessment ?X - General Physical Exam (combine w/ comprehensive assessment (listed just below) when performed on new ?1 20 ?pt. evals) ?X- 1 25 ?Comprehensive Assessment (HX, ROS, Risk Assessments, Wounds Hx, etc.) ?ASSESSMENTS - Wound and Skin Assessment / Reassessment ?[]  - Simple Wound Assessment / Reassessment - one wound 0 ?X- 3 5 ?Complex Wound Assessment / Reassessment - multiple wounds ?[]  - 0 ?Dermatologic / Skin Assessment (not related to wound area) ?ASSESSMENTS - Ostomy and/or Continence Assessment and Care ?[]  - Incontinence Assessment and Management 0 ?[]  - 0 ?Ostomy Care Assessment and Management (repouching, etc.) ?PROCESS - Coordination of Care ?[]  - Simple Patient / Family Education for ongoing care 0 ?X- 1 20 ?Complex (extensive) Patient / Family Education for ongoing care ?[]  - 0 ?Staff obtains Consents, Records, Test Results / Process Orders ?[]  - 0 ?Staff telephones HHA, Nursing Homes / Clarify orders / etc ?[]  - 0 ?Routine Transfer to another Facility (non-emergent condition) ?[]  - 0 ?Routine Hospital Admission (non-emergent condition) ?X- 1 15 ?New Admissions / / Ordering NPWT, Apligraf, etc. ?[]  - 0 ?Emergency Hospital Admission (emergent condition) ?[]  - 0 ?Simple Discharge Coordination ?[]  - 0 ?Complex (extensive) Discharge Coordination ?PROCESS - Special Needs ?[]  - Pediatric / Minor Patient Management 0 ?[]  - 0 ?Isolation Patient Management ?[]  - 0 ?Hearing / Language / Visual special needs ?[]  - 0 ?Assessment of Community assistance (transportation, D/C planning, etc.) ?[]  - 0 ?Additional assistance / Altered mentation ?[]  - 0 ?Support Surface(s) Assessment (bed, cushion, seat, etc.) ?INTERVENTIONS - Wound Cleansing /  Measurement ?X - Wound Imaging (photographs - any number of wounds) 1 5 ?[]  - 0 ?Wound Tracing (instead of photographs) ?[]  - 0 ?Simple Wound Measurement - one wound ?X- 3 5 ?Complex Wound Measurement - multiple wounds ?DIVIT, STIPP ( ) ?[]  - 0 ?Simple Wound Cleansing - one wound ?  X- 3 5 ?Complex Wound Cleansing - multiple wounds ?INTERVENTIONS - Wound Dressings ?X - Small Wound Dressing one or multiple wounds 3 10 ?[]  - 0 ?Medium Wound Dressing one or multiple wounds ?[]  - 0 ?Large Wound Dressing one or multiple wounds ?[]  - 0 ?Application of Medications - injection ?INTERVENTIONS - Miscellaneous ?[]  - External ear exam 0 ?[]  - 0 ?Specimen Collection (cultures, biopsies, blood, body fluids, etc.) ?[]  - 0 ?Specimen(s) / Culture(s) sent or taken to Lab for analysis ?[]  - 0 ?Patient Transfer (multiple staff / / Similar devices) ?[]  - 0 ?Simple Staple / Suture removal (25 or less) ?[]  - 0 ?Complex Staple / Suture removal (26 or more) ?[]  - 0 ?Hypo / Hyperglycemic Management (close monitor of Blood Glucose) ?[]  - 0 ?Ankle / Brachial Index (ABI) - do not check if billed separately ?Has the patient been seen at the hospital within the last three years: Yes ?Total Score: 160 ?Level Of Care: New/Established - Level ?5 ?Electronic Signature(s) ?Signed: 09/27/2021 4:04:50 PM By: RN ?Entered By: on 09/26/2021 12:53:04 ?Martin Riddle, Martin Riddle ( ) ?-------------------------------------------------------------------------------- ?Encounter Discharge Information Details ?Patient Name: Martin Riddle, Martin Riddle ?Date of Service: 09/26/2021 8:45 AM ?Medical Record Number: ?Patient Account Number: ?Date of Birth/Sex: [birth year] (51 y.o. M) ?Treating RN: [name] ?Primary Care Eivin Mascio: [name] Other Clinician: ?Referring Nasra Counce: 09/28/2021 ?Treating Tibor Lemmons/Extender: Olin Hauser ?Weeks in Treatment: 0 ?Encounter Discharge Information Items Post Procedure  Vitals ?Discharge Condition: Stable ?Temperature (?F): 98.1 ?Ambulatory Status: [id number] ?Pulse (bpm): 83 ?Discharge Destination: Home ?Respiratory Rate (breaths/min): 18 ?Transportation: Private Auto ?Blood Pressure (mmHg): 156/80 ?Accompanied By: wife ?Schedule Follow-up Appointment: Yes ?Clinical Summary of Care: Patient Declined ?Electronic Signature(s) ?Signed: 09/26/2021 1:17:15 PM By: 09/28/2021 RN ?Entered By: [id number] on 09/26/2021 13:17:15 ?Martin Riddle, Martin Riddle (44) ?-------------------------------------------------------------------------------- ?Lower Extremity Assessment Details ?Patient Name: Martin Riddle, Martin Riddle ?Date of Service: 09/26/2021 8:45 AM ?Medical Record Number: Rosetta Posner ?Patient Account Number: Cutillo Derry ?Date of Birth/Sex: 05-11-1971 (52 y.o. M) ?Treating RN: [name] ?Primary Care Zyaire Mccleod: [name] Other Clinician: ?Referring Devinne Epstein: 09/28/2021 ?Treating Seville Brick/Extender: Olin Hauser ?Weeks in Treatment: 0 ?Edema Assessment ?Assessed: [Left: No] [Right: No] ?Edema: [Left: Ye] [Right: s] ?Calf ?Left: Right: ?Point of Measurement: 36 cm From Medial Instep 48 cm ?Ankle ?Left: Right: ?Point of Measurement: 14 cm From Medial Instep 26 cm ?Knee To Floor ?Left: Right: ?From Medial Instep 50 cm ?Notes ?unable to find pulse notified Katyana Trolinger ?Electronic Signature(s) ?Signed: 09/26/2021 9:46:08 AM By: Olin Hauser RN ?Entered By: 09/28/2021 on 09/26/2021 09:46:07 ?Martin Riddle, Martin Riddle (01/12/1971) ?-------------------------------------------------------------------------------- ?Multi Wound Chart Details ?Patient Name: Martin Riddle, Martin Riddle ?Date of Service: 09/26/2021 8:45 AM ?Medical Record Number: Marletta Lor ?Patient Account Number: Rosetta Posner ?Date of Birth/Sex: 1970-08-18 (51 y.o. M) ?Treating RN: [name] ?Primary Care  Wenzlick: [name] Other Clinician: ?Referring Ankit Degregorio: 09/28/2021 ?Treating Arcenia Scarbro/Extender: Olin Hauser ?Weeks in Treatment: 0 ?Vital Signs ?Height(in):  76 ?Pulse(bpm): 83 ?Weight(lbs): 330 ?Blood Pressure(mmHg): 156/80 ?Body Mass Index(BMI): 40.2 ?Temperature(??F): 98.1 ?Respiratory Rate(breaths/min): 18 ?Photos: [1:No Photos] [2:No Photos] [3:No Photos] ?Wound Location: [1:Right Calca

## 2021-09-27 NOTE — Progress Notes (Signed)
Martin Riddle, Martin Riddle ([id number]) ?Visit Report for 09/26/2021 ?Abuse Risk Screen Details ?Patient Name: Martin Riddle, Martin Riddle ?Date of Service: 09/26/2021 8:45 AM ?Medical Record Number: [id number] ?Patient Account Number: [id number] ?Date of Birth/Sex: 05-18-1971 (51 y.o. M) ?Treating RN: Carlene Coria ?Primary Care Patriece Archbold: Alvester Chou Other Clinician: ?Referring Sajad Glander: Caroline More ?Treating Crissy Mccreadie/Extender: Jeri Cos ?Weeks in Treatment: 0 ?Abuse Risk Screen Items ?Answer ?ABUSE RISK SCREEN: ?Has anyone close to you tried to hurt or harm you recentlyo No ?Do you feel uncomfortable with anyone in your familyo No ?Has anyone forced you do things that you didnot want to doo No ?Electronic Signature(s) ?Signed: 09/27/2021 4:04:50 PM By: Carlene Coria RN ?Entered By: Carlene Coria on 09/26/2021 09:11:16 ?Martin Riddle, Martin Riddle ([id number]) ?-------------------------------------------------------------------------------- ?Activities of Daily Living Details ?Patient Name: Martin Riddle, Martin Riddle ?Date of Service: 09/26/2021 8:45 AM ?Medical Record Number: [id number] ?Patient Account Number: [id number] ?Date of Birth/Sex: 12/22/1970 (51 y.o. M) ?Treating RN: Carlene Coria ?Primary Care Estuardo Frisbee: Alvester Chou Other Clinician: ?Referring Caidance Sybert: Caroline More ?Treating Tarig Zimmers/Extender: Jeri Cos ?Weeks in Treatment: 0 ?Activities of Daily Living Items ?Answer ?Activities of Daily Living (Please select one for each item) ?[place] ?Take Medications Completely Able ?Use Telephone Completely Able ?Care for Appearance Completely Able ?Use Toilet Completely Able ?Bath / Shower Completely Able ?Dress Self Completely Able ?Feed Self Completely Able ?Walk Completely Able ?Get In / Out Bed Completely Able ?Housework Completely Able ?Prepare Meals Completely Able ?Handle Money Completely Able ?Shop for Self Completely Able ?Electronic Signature(s) ?Signed: 09/27/2021 4:04:50 PM By: Carlene Coria RN ?Entered By: Carlene Coria on 09/26/2021  09:11:37 ?Martin Riddle, Martin Riddle ([id number]) ?-------------------------------------------------------------------------------- ?Education Screening Details ?Patient Name: Martin Riddle, Martin Riddle ?Date of Service: 09/26/2021 8:45 AM ?Medical Record Number: [id number] ?Patient Account Number: [id number] ?Date of Birth/Sex: 1971/01/26 (51 y.o. M) ?Treating RN: Carlene Coria ?Primary Care Taylormarie Register: Alvester Chou Other Clinician: ?Referring Connelly Spruell: Caroline More ?Treating Mikolaj Woolstenhulme/Extender: Jeri Cos ?Weeks in Treatment: 0 ?Primary Learner Assessed: Patient ?Learning Preferences/Education Level/Primary Language ?Learning Preference: Explanation ?Highest Education Level: High School ?Preferred Language: English ?Cognitive Barrier ?Translator Needed: No ?Memory Deficit: No ?Emotional Barrier: No ?Cultural/Religious Beliefs Affecting Medical Care: No ?Physical Barrier ?Impaired Vision: No ?Impaired Hearing: No ?Decreased Hand dexterity: No ?Knowledge/Comprehension ?Knowledge Level: Medium ?Comprehension Level: High ?Ability to understand written instructions: High ?Ability to understand verbal instructions: High ?Motivation ?Anxiety Level: Anxious ?Cooperation: Cooperative ?Education Importance: Acknowledges Need ?Interest in Health Problems: Asks Questions ?Perception: Coherent ?Willingness to Engage in Self-Management ?Medium ?Activities: ?Readiness to Engage in Self-Management ?Medium ?Activities: ?Electronic Signature(s) ?Signed: 09/27/2021 4:04:50 PM By: Carlene Coria RN ?Entered By: Carlene Coria on 09/26/2021 09:12:10 ?Martin Riddle, Martin Riddle ([id number]) ?-------------------------------------------------------------------------------- ?Fall Risk Assessment Details ?Patient Name: Martin Riddle, Martin Riddle ?Date of Service: 09/26/2021 8:45 AM ?Medical Record Number: [id number] ?Patient Account Number: [id number] ?Date of Birth/Sex: [birth year] (51 y.o. M) ?Treating RN: Carlene Coria ?Primary Care Roshana Shuffield: Alvester Chou Other Clinician: ?Referring Azlynn Mitnick: Caroline More ?Treating Cordarryl Monrreal/Extender: Jeri Cos ?Weeks in Treatment: 0 ?Fall Risk Assessment Items ?Have you had 2 or more falls in the last 12 monthso 0 No ?Have you had any fall that resulted in injury in the last 12 monthso 0 No ?FALLS RISK SCREEN ?History of falling - immediate or within 3 months 0 No ?Secondary diagnosis (Do you have 2 or more medical diagnoseso) 0 No ?Ambulatory aid ?None/bed rest/wheelchair/nurse 0 No ?Crutches/cane/walker 0 No ?Furniture 0 No ?Intravenous therapy Access/Saline/Heparin Lock 0 No ?Gait/Transferring ?Normal/ bed rest/ wheelchair 0 No ?Weak (short steps with or without shuffle, stooped  but able to lift head while walking, may ?0 No ?seek support from furniture) ?Impaired (short steps with shuffle, may have difficulty arising from chair, head down, impaired ?0 No ?balance) ?Mental Status ?Oriented to own ability 0 No ?Electronic Signature(s) ?Signed: 09/27/2021 4:04:50 PM By: Carlene Coria RN ?Entered By: Carlene Coria on 09/26/2021 09:12:17 ?Martin Riddle, Martin Riddle ([id number]) ?-------------------------------------------------------------------------------- ?Nutrition Risk Screening Details ?Patient Name: Martin Riddle, Martin Riddle ?Date of Service: 09/26/2021 8:45 AM ?Medical Record Number: [id number] ?Patient Account Number: [id number] ?Date of Birth/Sex: January 05, 1971 (51 y.o. M) ?Treating RN: Carlene Coria ?Primary Care Breckyn Ticas: Alvester Chou Other Clinician: ?Referring Mikala Podoll: Caroline More ?Treating Lauralye Kinn/Extender: Jeri Cos ?Weeks in Treatment: 0 ?Height (in): 76 ?Weight (lbs): 330 ?Body Mass Index (BMI): 40.2 ?Nutrition Risk Screening Items ?Score Screening ?NUTRITION RISK SCREEN: ?I have an illness or condition that made me change the kind and/or amount of food I eat 2 Yes ?I eat fewer than two meals per day 0 No ?I eat few fruits and vegetables, or milk products 0 No ?I have three or more drinks of beer, liquor or wine almost every day 0 No ?I have tooth or mouth problems that make it hard for  me to eat 0 No ?I don't always have enough money to buy the food I need 0 No ?I eat alone most of the time 0 No ?I take three or more different prescribed or over-the-counter drugs a day 1 Yes ?Without wanting to, I have lost or gained 10 pounds in the last six months 0 No ?I am not always physically able to shop, cook and/or feed myself 0 No ?Nutrition Protocols ?Good Risk Protocol ?Moderate Risk Protocol 0 Provide education on nutrition ?High Risk Proctocol ?Risk Level: Moderate Risk ?Score: 3 ?Electronic Signature(s) ?Signed: 09/27/2021 4:04:50 PM By: Carlene Coria RN ?Entered By: Carlene Coria on 09/26/2021 09:12:31 ?

## 2021-10-03 ENCOUNTER — Ambulatory Visit: Payer: Medicare Other | Admitting: Internal Medicine

## 2021-10-07 ENCOUNTER — Other Ambulatory Visit (INDEPENDENT_AMBULATORY_CARE_PROVIDER_SITE_OTHER): Payer: Self-pay | Admitting: Physician Assistant

## 2021-10-07 ENCOUNTER — Encounter (INDEPENDENT_AMBULATORY_CARE_PROVIDER_SITE_OTHER): Payer: Medicare Other

## 2021-10-07 DIAGNOSIS — E11621 Type 2 diabetes mellitus with foot ulcer: Secondary | ICD-10-CM

## 2021-10-07 DIAGNOSIS — I872 Venous insufficiency (chronic) (peripheral): Secondary | ICD-10-CM

## 2021-10-10 ENCOUNTER — Ambulatory Visit: Payer: Medicare Other | Admitting: Physician Assistant

## 2021-10-17 ENCOUNTER — Encounter: Payer: Medicare Other | Attending: Physician Assistant | Admitting: Physician Assistant

## 2021-10-17 ENCOUNTER — Other Ambulatory Visit
Admission: RE | Admit: 2021-10-17 | Discharge: 2021-10-17 | Disposition: A | Discharge status: Home / Self Care | Payer: Medicare Other | Source: Ambulatory Visit | Attending: Physician Assistant | Admitting: Physician Assistant

## 2021-10-17 DIAGNOSIS — L97822 Non-pressure chronic ulcer of other part of left lower leg with fat layer exposed: Secondary | ICD-10-CM | POA: Insufficient documentation

## 2021-10-17 DIAGNOSIS — I10 Essential (primary) hypertension: Secondary | ICD-10-CM | POA: Diagnosis not present

## 2021-10-17 DIAGNOSIS — L97522 Non-pressure chronic ulcer of other part of left foot with fat layer exposed: Secondary | ICD-10-CM | POA: Diagnosis not present

## 2021-10-17 DIAGNOSIS — I890 Lymphedema, not elsewhere classified: Secondary | ICD-10-CM | POA: Insufficient documentation

## 2021-10-17 DIAGNOSIS — Z6841 Body Mass Index (BMI) 40.0 and over, adult: Secondary | ICD-10-CM | POA: Diagnosis not present

## 2021-10-17 DIAGNOSIS — I872 Venous insufficiency (chronic) (peripheral): Secondary | ICD-10-CM | POA: Diagnosis not present

## 2021-10-17 DIAGNOSIS — E669 Obesity, unspecified: Secondary | ICD-10-CM | POA: Insufficient documentation

## 2021-10-17 DIAGNOSIS — B999 Unspecified infectious disease: Secondary | ICD-10-CM | POA: Diagnosis present

## 2021-10-17 DIAGNOSIS — E11622 Type 2 diabetes mellitus with other skin ulcer: Secondary | ICD-10-CM | POA: Insufficient documentation

## 2021-10-17 DIAGNOSIS — E11621 Type 2 diabetes mellitus with foot ulcer: Secondary | ICD-10-CM | POA: Insufficient documentation

## 2021-10-17 DIAGNOSIS — L97412 Non-pressure chronic ulcer of right heel and midfoot with fat layer exposed: Secondary | ICD-10-CM | POA: Diagnosis not present

## 2021-10-17 NOTE — Progress Notes (Signed)
SEKOU, ZUCKERMAN ([id number]) ?Visit Report for 10/17/2021 ?Arrival Information Details ?Patient Name: Martin Riddle, Martin Riddle ?Date of Service: 10/17/2021 8:00 AM ?Medical Record Number: [id number] ?Patient Account Number: [id number] ?Date of Birth/Sex: 1970-09-18 (51 y.o. M) ?Treating RN: Hansel Feinstein ?Primary Care Ewart Carrera: Marletta Lor Other Clinician: ?Referring Aleeyah Bensen: Marletta Lor ?Treating Deondrick Searls/Extender: Herbst Derry ?Weeks in Treatment: 3 ?Visit Information History Since Last Visit ?Added or deleted any medications: No ?Patient Arrived: Gilmer Mor ?Had a fall or experienced change in No ?Arrival Time: 08:53 ?activities of daily living that may affect ?Accompanied By: wife ?risk of falls: ?Transfer Assistance: None ?Hospitalized since last visit: No ?Patient Identification Verified: Yes ?Has Dressing in Place as Prescribed: Yes ?Secondary Verification Process Completed: Yes ?Pain Present Now: Yes ?Patient Requires Transmission-Based No ?Precautions: ?Patient Has Alerts: Yes ?Patient Alerts: Patient on Blood ?Thinner ?DIABETIC ?aspirin 81mg  ?Electronic Signature(s) ?Signed: 10/17/2021 3:52:15 PM By: 12/17/2021 ?Entered ByHansel Feinstein on 10/17/2021 08:54:16 ?Martin Riddle, Martin Riddle (Martin Riddle) ?-------------------------------------------------------------------------------- ?Clinic Level of Care Assessment Details ?Patient Name: Martin Riddle, Martin Riddle ?Date of Service: 10/17/2021 8:00 AM ?Medical Record Number: 12/17/2021 ?Patient Account Number: [id number] ?Date of Birth/Sex: [birth year] (51 y.o. M) ?Treating RN: 44 ?Primary Care Amillia Biffle: Hansel Feinstein Other Clinician: ?Referring Adylene Dlugosz: Marletta Lor ?Treating Cypher Paule/Extender: Marletta Lor ?Weeks in Treatment: 3 ?Clinic Level of Care Assessment Items ?TOOL 4 Quantity Score ?[]  - Use when only an EandM is performed on FOLLOW-UP visit 0 ?ASSESSMENTS - Nursing Assessment / Reassessment ?[]  - Reassessment of Co-morbidities (includes updates in patient status) 0 ?[]  - 0 ?Reassessment of  Adherence to Treatment Plan ?ASSESSMENTS - Wound and Skin Assessment / Reassessment ?[]  - Simple Wound Assessment / Reassessment - one wound 0 ?X- 2 5 ?Complex Wound Assessment / Reassessment - multiple wounds ?[]  - 0 ?Dermatologic / Skin Assessment (not related to wound area) ?ASSESSMENTS - Focused Assessment ?[]  - Circumferential Edema Measurements - multi extremities 0 ?[]  - 0 ?Nutritional Assessment / Counseling / Intervention ?[]  - 0 ?Lower Extremity Assessment (monofilament, tuning fork, pulses) ?[]  - 0 ?Peripheral Arterial Disease Assessment (using hand held doppler) ?ASSESSMENTS - Ostomy and/or Continence Assessment and Care ?[]  - Incontinence Assessment and Management 0 ?[]  - 0 ?Ostomy Care Assessment and Management (repouching, etc.) ?PROCESS - Coordination of Care ?X - Simple Patient / Family Education for ongoing care 1 15 ?[]  - 0 ?Complex (extensive) Patient / Family Education for ongoing care ?[]  - 0 ?Staff obtains Consents, Records, Test Results / Process Orders ?[]  - 0 ?Staff telephones HHA, Nursing Homes / Clarify orders / etc ?[]  - 0 ?Routine Transfer to another Facility (non-emergent condition) ?[]  - 0 ?Routine Hospital Admission (non-emergent condition) ?[]  - 0 ?New Admissions / Brodrick Derry / Ordering NPWT, Apligraf, etc. ?[]  - 0 ?Emergency Hospital Admission (emergent condition) ?X- 1 10 ?Simple Discharge Coordination ?[]  - 0 ?Complex (extensive) Discharge Coordination ?PROCESS - Special Needs ?[]  - Pediatric / Minor Patient Management 0 ?[]  - 0 ?Isolation Patient Management ?[]  - 0 ?Hearing / Language / Visual special needs ?[]  - 0 ?Assessment of Community assistance (transportation, D/C planning, etc.) ?[]  - 0 ?Additional assistance / Altered mentation ?[]  - 0 ?Support Surface(s) Assessment (bed, cushion, seat, etc.) ?INTERVENTIONS - Wound Cleansing / Measurement ?Martin Riddle, Martin Riddle ( ) ?X- 1 5 ?Simple Wound Cleansing - one wound ?[]  - 0 ?Complex Wound Cleansing - multiple  wounds ?X- 1 5 ?Wound Imaging (photographs - any number of wounds) ?[]  - 0 ?Wound Tracing (instead of photographs) ?X- 1 5 ?Simple Wound Measurement - one  wound ?[]  - 0 ?Complex Wound Measurement - multiple wounds ?INTERVENTIONS - Wound Dressings ?X - Small Wound Dressing one or multiple wounds 3 10 ?[]  - 0 ?Medium Wound Dressing one or multiple wounds ?[]  - 0 ?Large Wound Dressing one or multiple wounds ?X- 1 5 ?Application of Medications - topical ?[]  - 0 ?Application of Medications - injection ?INTERVENTIONS - Miscellaneous ?[]  - External ear exam 0 ?[]  - 0 ?Specimen Collection (cultures, biopsies, blood, body fluids, etc.) ?[]  - 0 ?Specimen(s) / Culture(s) sent or taken to Lab for analysis ?[]  - 0 ?Patient Transfer (multiple staff / / Similar devices) ?[]  - 0 ?Simple Staple / Suture removal (25 or less) ?[]  - 0 ?Complex Staple / Suture removal (26 or more) ?[]  - 0 ?Hypo / Hyperglycemic Management (close monitor of Blood Glucose) ?[]  - 0 ?Ankle / Brachial Index (ABI) - do not check if billed separately ?X- 1 5 ?Vital Signs ?Has the patient been seen at the hospital within the last three years: Yes ?Total Score: 90 ?Level Of Care: New/Established - Level ?3 ?Electronic Signature(s) ?Signed: 10/17/2021 3:52:15 PM By: ?Entered By on 10/17/2021 09:29:02 ?Martin Riddle, Martin Riddle ( ) ?-------------------------------------------------------------------------------- ?Encounter Discharge Information Details ?Patient Name: Martin Riddle, Martin Riddle ?Date of Service: 10/17/2021 8:00 AM ?Medical Record Number: ?Patient Account Number: ?Date of Birth/Sex: Oct 20, 1970 (51 y.o. M) ?Treating RN: 12/17/2021 ?Primary Care Latif Nazareno: Hansel Feinstein Other Clinician: ?Referring Christen Wardrop: Hansel Feinstein ?Treating Nyja Westbrook/Extender: 12/17/2021 ?Weeks in Treatment: 3 ?Encounter Discharge Information Items ?Discharge Condition: Stable ?Ambulatory Status: Martin Riddle ?Discharge Destination:  Home ?Transportation: Private Auto ?Accompanied By: wife ?Schedule Follow-up Appointment: Yes ?Clinical Summary of Care: ?Electronic Signature(s) ?Signed: 10/17/2021 3:52:15 PM By: Martin Riddle ?Entered By7/04/2022 on 10/17/2021 09:39:32 ?Martin Riddle, Martin Riddle (01/12/1971) ?-------------------------------------------------------------------------------- ?Lower Extremity Assessment Details ?Patient Name: Martin Riddle, Martin Riddle ?Date of Service: 10/17/2021 8:00 AM ?Medical Record Number: Marletta Lor ?Patient Account Number: Marletta Lor ?Date of Birth/Sex: 03/02/1971 (51 y.o. M) ?Treating RN: 12/17/2021 ?Primary Care Caylan Schifano: Hansel Feinstein Other Clinician: ?Referring Hollie Wojahn: Hansel Feinstein ?Treating Selinda Korzeniewski/Extender: 12/17/2021 ?Weeks in Treatment: 3 ?Edema Assessment ?Assessed: [Left: Yes] [Right: Yes] ?Edema: [Left: Yes] [Right: Yes] ?Calf ?Left: Right: ?Point of Measurement: 36 cm From Medial Instep 46.5 cm 48.5 cm ?Ankle ?Left: Right: ?Point of Measurement: 14 cm From Medial Instep 28 cm 31 cm ?Electronic Signature(s) ?Signed: 10/17/2021 3:52:15 PM By: [id number] ?Entered ByOlin Riddle on 10/17/2021 09:05:28 ?Martin Riddle, Martin Riddle ([id number]) ?-------------------------------------------------------------------------------- ?Multi Wound Chart Details ?Patient Name: Martin Riddle, Martin Riddle ?Date of Service: 10/17/2021 8:00 AM ?Medical Record Number: Hansel Feinstein ?Patient Account Number: Marletta Lor ?Date of Birth/Sex: 1971-01-27 (51 y.o. M) ?Treating RN: 12/17/2021 ?Primary Care Kealohilani Maiorino: Hansel Feinstein Other Clinician: ?Referring Kahil Agner: Hansel Feinstein ?Treating Skyley Grandmaison/Extender: 12/17/2021 ?Weeks in Treatment: 3 ?Vital Signs ?Height(in): 76 ?Pulse(bpm): 87 ?Weight(lbs): 330 ?Blood Pressure(mmHg): 133/74 ?Body Mass Index(BMI): 40.2 ?Temperature(??F): 98.1 ?Respiratory Rate(breaths/min): 16 ?Photos: ?Wound Location: Right Calcaneus Left Lower Leg Left Metatarsal head fifth ?Wounding Event: Gradually Appeared Gradually Appeared Gradually Appeared ?Primary  Etiology: Diabetic Wound/Ulcer of the Lower Diabetic Wound/Ulcer of the Lower Diabetic Wound/Ulcer of the Lower ?Extremity Extremity Extremity ?Comorbid History: Hypertension, Type II Diabetes Hypertension, Type II Diabetes Hyp

## 2021-10-17 NOTE — Progress Notes (Addendum)
Martin Riddle, Martin C. ([id number]) ?Visit Report for 10/17/2021 ?Chief Complaint Document Details ?Patient Name: Martin Riddle, Martin C. ?Date of Service: 10/17/2021 8:00 AM ?Medical Record Number: [id number] ?Patient Account Number: [id number] ?Date of Birth/Sex: 10/25/1970 (51 y.o. M) ?Treating RN: Huel CoventryWoody, Kim ?Primary Care Provider: Marletta LorBARR, JULIE Other Clinician: ?Referring Provider: Marletta LorBARR, JULIE ?Treating Provider/Extender: Ament DerryStone, Joseh Sjogren ?Weeks in Treatment: 3 ?Information Obtained from: Patient ?Chief Complaint ?Bilateral foot ulcers and left leg ulcer ?Electronic Signature(s) ?Signed: 10/17/2021 9:15:08 AM By: Lenda KelpStone III, Aviya Jarvie PA-C ?Entered By: Lenda KelpStone III, Selestino Nila on 10/17/2021 09:15:08 ?Martin Riddle, Martin C. ([id number]) ?-------------------------------------------------------------------------------- ?HPI Details ?Patient Name: Martin Riddle, Martin C. ?Date of Service: 10/17/2021 8:00 AM ?Medical Record Number: [id number] ?Patient Account Number: [id number] ?Date of Birth/Sex: 02/15/1971 (51 y.o. M) ?Treating RN: Huel CoventryWoody, Kim ?Primary Care Provider: Marletta LorBARR, JULIE Other Clinician: ?Referring Provider: Marletta LorBARR, JULIE ?Treating Provider/Extender: Oyster DerryStone, Hydie Langan ?Weeks in Treatment: 3 ?History of Present Illness ?HPI Description: 09-26-2021 upon evaluation today patient presents today for evaluation of wounds on the right heel, left lower leg, and left fifth ?metatarsal head location more plantar. With that being said these wounds have been present for since around June 09, 2021. Currently the ?patient has had dressings on that were unsure exactly how long they have been in place he has legs that appear to be extremely dry and he had a ?lot of dead tissue and dry skin buildup. It does make me concerned about the fact that he has not been changing these dressings appropriately ?nor anywhere close to regularly. Currently with regard to the wounds he does seem to be doing decently well all things considered. That is at least ?good news. I see no signs of obvious active  infection at the moment. The patient's most recent ABI was on 05/31/2021 and was on the right 1.15 ?and left 1.02 there was no arterial study performed for completeness nor a TBI. This may be something to consider going forward. ?Patient has otherwise a history of diabetes mellitus type 2, chronic venous insufficiency, lymphedema, and hypertension. ?10-17-2021 upon evaluation today patient actually appears to be doing well today with regard to his leg on the left I am very pleased both with the ?foot as well as the leg I think we are on the right track here. As far as the wound on the right leg this is more circumferential and to be honest it ?actually has blue-green drainage consistent with Pseudomonas and appears to be significantly worse compared to last week's evaluation. I do ?believe that this is going to likely require some antibiotics I Minna send this into the pharmacy for him. ?Electronic Signature(s) ?Signed: 10/17/2021 9:42:56 AM By: Lenda KelpStone III, Tesean Stump PA-C ?Entered By: Lenda KelpStone III, Fowler Antos on 10/17/2021 09:42:56 ?Martin Riddle, Shjon C. ([id number]) ?-------------------------------------------------------------------------------- ?Physical Exam Details ?Patient Name: Martin Riddle, Martin C. ?Date of Service: 10/17/2021 8:00 AM ?Medical Record Number: [id number] ?Patient Account Number: [id number] ?Date of Birth/Sex: 06/30/1970 (51 y.o. M) ?Treating RN: Huel CoventryWoody, Kim ?Primary Care Provider: Marletta LorBARR, JULIE Other Clinician: ?Referring Provider: Marletta LorBARR, JULIE ?Treating Provider/Extender: Ingwersen DerryStone, Haley Roza ?Weeks in Treatment: 3 ?Constitutional ?Obese and well-hydrated in no acute distress. ?Respiratory ?normal breathing without difficulty. ?Psychiatric ?this patient is able to make decisions and demonstrates good insight into disease process. Alert and Oriented x 3. pleasant and cooperative. ?Notes ?Upon inspection patient's wound bed actually showed signs of good granulation epithelization at this point. Fortunately the left leg is doing well. ?With  that being said the right leg is not doing nearly as good. I am very concerned about this to be  honest there is significant cellulitis from just ?above the ankle to just below the knee. There is also blue-green drainage I do believe is consistent with Pseudomonas I would obtain a wound ?culture today that will be sent into the pharmacy. Subsequently however I do believe that the patient is going require a stronger dose of this and I ?am hoping to get this under control within the next 48 hours although he was advised what to look for for anything getting worse and if that ?happens he should be seen ASAP in the ER. ?Electronic Signature(s) ?Signed: 10/17/2021 9:43:36 AM By: Lenda Kelp PA-C ?Entered By: Lenda Kelp on 10/17/2021 09:43:35 ?Martin Riddle, Martin Riddle ([id number]) ?-------------------------------------------------------------------------------- ?Physician Orders Details ?Patient Name: Martin Riddle, Martin Riddle ?Date of Service: 10/17/2021 8:00 AM ?Medical Record Number: [id number] ?Patient Account Number: [id number] ?Date of Birth/Sex: [birth year] (51 y.o. M) ?Treating RN: Hansel Feinstein ?Primary Care Provider: Marletta Lor Other Clinician: ?Referring Provider: Marletta Lor ?Treating Provider/Extender: Yasui Derry ?Weeks in Treatment: 3 ?Verbal / Phone Orders: No ?Diagnosis Coding ?ICD-10 Coding ?Code Description ?E11.621 Type 2 diabetes mellitus with foot ulcer ?H06.237 Non-pressure chronic ulcer of other part of left foot with fat layer exposed ?L97.412 Non-pressure chronic ulcer of right heel and midfoot with fat layer exposed ?I87.2 Venous insufficiency (chronic) (peripheral) ?I89.0 Lymphedema, not elsewhere classified ?S28.315 Non-pressure chronic ulcer of other part of left lower leg with fat layer exposed ?I10 Essential (primary) hypertension ?Follow-up Appointments ?o Return Appointment in 1 week. ?o Nurse Visit as needed ?o Other: - Call the number to schedule Vascular appt ?Bathing/ Shower/ Hygiene ?o May  shower; gently cleanse wound with antibacterial soap, rinse and pat dry prior to dressing wounds ?o No tub bath. ?Anesthetic (Use 'Patient Medications' Section for Anesthetic Order Entry) ?o Lidocaine applied to wound bed ?Edema Control - Lymphedema / Segmental Compressive Device / Other ?o Elevate, Exercise Daily and Avoid Standing for Long Periods of Time. ?o Elevate legs to the level of the heart and pump ankles as often as possible ?o Elevate leg(s) parallel to the floor when sitting. ?o DO YOUR BEST to sleep in the bed at night. DO NOT sleep in your recliner. Long hours of sitting in a recliner leads to ?swelling of the legs and/or potential wounds on your backside. ?Additional Orders / Instructions ?o Follow Nutritious Diet and Increase Protein Intake - monitor blood sugar ?Medications-Please add to medication list. ?o P.O. Antibiotics - Pick up and start Levaquin ?Wound Treatment ?Wound #1 - Calcaneus Wound Laterality: Right ?Cleanser: Soap and Water 3 x Per Week/30 Days ?Discharge Instructions: Gently cleanse wound with antibacterial soap, rinse and pat dry prior to dressing wounds ?Cleanser: Wound Cleanser 3 x Per Week/30 Days ?Discharge Instructions: Wash your hands with soap and water. Remove old dressing, discard into plastic bag and place into trash. ?Cleanse the wound with Wound Cleanser prior to applying a clean dressing using gauze sponges, not tissues or cotton balls. Do not ?scrub or use excessive force. Pat dry using gauze sponges, not tissue or cotton balls. ?Primary Dressing: Silvercel 4 1/4x 4 1/4 (in/in) 3 x Per Week/30 Days ?Discharge Instructions: Apply Silvercel 4 1/4x 4 1/4 (in/in) as instructed ?Secondary Dressing: ABD Pad 5x9 (in/in) 3 x Per Week/30 Days ?Discharge Instructions: Cover with ABD pad ?Secured With: Medipore Tape - 56M Medipore H Soft Cloth Surgical Tape, 2x2 (in/yd) 3 x Per Week/30 Days ?Secured With: [employer] or Non-Sterile 6-ply 4.5x4 (yd/yd) 3 x  Per Week/30 Days ?Martin Riddle, Martin C. (  [id number]) ?Discharge Instructions: Apply Kerlix as directed ?Secured With: Tubigrip Size E, 3.5x10 (in/yds) 3 x Per Week/30 Days ?Discharge Instructions: Apply 3 Tubigrip E 3-

## 2021-10-21 LAB — AEROBIC CULTURE W GRAM STAIN (SUPERFICIAL SPECIMEN)

## 2021-10-24 ENCOUNTER — Encounter: Payer: Medicare Other | Admitting: Physician Assistant

## 2021-10-24 DIAGNOSIS — E11621 Type 2 diabetes mellitus with foot ulcer: Secondary | ICD-10-CM | POA: Diagnosis not present

## 2021-10-24 NOTE — Progress Notes (Addendum)
Riddle, Martin ([id number]) Visit Report for 10/24/2021 Chief Complaint Document Details Patient Name: Martin Riddle, Martin Riddle. Date of Service: 10/24/2021 8:00 AM Medical Record Number: [id number] Patient Account Number: [id number] Date of Birth/Sex: Nov 06, 1970 (51 y.o. M) Treating RN: Huel Coventry Primary Care Provider: Marletta Lor Other Clinician: Referring Provider: Marletta Lor Treating Provider/Extender: Rowan Blase in Treatment: 4 Information Obtained from: Patient Chief Complaint Bilateral foot ulcers and left leg ulcer Electronic Signature(s) Signed: 10/24/2021 8:30:05 AM By: Lenda Kelp PA-C Entered By: Lenda Kelp on 10/24/2021 08:30:04 TRAYVOND, VIETS ([id number]) -------------------------------------------------------------------------------- HPI Details Patient Name: Martin, Riddle. Date of Service: 10/24/2021 8:00 AM Medical Record Number: [id number] Patient Account Number: [id number] Date of Birth/Sex: Dec 28, 1970 (51 y.o. M) Treating RN: Huel Coventry Primary Care Provider: Marletta Lor Other Clinician: Referring Provider: Marletta Lor Treating Provider/Extender: Rowan Blase in Treatment: 4 History of Present Illness HPI Description: 09-26-2021 upon evaluation today patient presents today for evaluation of wounds on the right heel, left lower leg, and left fifth metatarsal head location more plantar. With that being said these wounds have been present for since around June 09, 2021. Currently the patient has had dressings on that were unsure exactly how long they have been in place he has legs that appear to be extremely dry and he had a lot of dead tissue and dry skin buildup. It does make me concerned about the fact that he has not been changing these dressings appropriately nor anywhere close to regularly. Currently with regard to the wounds he does seem to be doing decently well all things considered. That is at least good news. I see no signs of obvious active  infection at the moment. The patient's most recent ABI was on 05/31/2021 and was on the right 1.15 and left 1.02 there was no arterial study performed for completeness nor a TBI. This may be something to consider going forward. Patient has otherwise a history of diabetes mellitus type 2, chronic venous insufficiency, lymphedema, and hypertension. 10-17-2021 upon evaluation today patient actually appears to be doing well today with regard to his leg on the left I am very pleased both with the foot as well as the leg I think we are on the right track here. As far as the wound on the right leg this is more circumferential and to be honest it actually has blue-green drainage consistent with Pseudomonas and appears to be significantly worse compared to last week's evaluation. I do believe that this is going to likely require some antibiotics I Minna send this into the pharmacy for him. 10-24-2021 upon evaluation today patient appears to be doing a little bit better in regard to his leg with regard to the color it does not appear to be quite as red as before he still has a tremendous amount of weeping. I do believe compression therapy would be beneficial for him but we are still waiting on the vascular study which should be upcoming. He tells me the 27th is the date that he has been given. Fortunately I do not see any signs of systemic infection right now. Electronic Signature(s) Signed: 10/24/2021 9:17:45 AM By: Lenda Kelp PA-C Entered By: Lenda Kelp on 10/24/2021 09:17:44 AVA, DEGUIRE ([id number]) -------------------------------------------------------------------------------- Physical Exam Details Patient Name: Martin, Riddle. Date of Service: 10/24/2021 8:00 AM Medical Record Number: [id number] Patient Account Number: [id number] Date of Birth/Sex: November 13, 1970 (51 y.o. M) Treating RN: Huel Coventry Primary Care Provider: Marletta Lor Other Clinician: Referring Provider: Teola Bradley  Raynelle Fanning Treating  Provider/Extender: Isidoro Derry Weeks in Treatment: 4 Constitutional Obese and well-hydrated in no acute distress. Respiratory normal breathing without difficulty. Psychiatric this patient is able to make decisions and demonstrates good insight into disease process. Alert and Oriented x 3. pleasant and cooperative. Notes Upon inspection patient's wound bed actually showed signs of good granulation and epithelization at this point. Fortunately I do not see any evidence of infection locally or systemically which is great and overall I am extremely pleased with where we stand currently. The good news is the plantar foot wound on the left is actually doing quite well. Electronic Signature(s) Signed: 10/24/2021 9:18:28 AM By: Lenda Kelp PA-C Entered By: Lenda Kelp on 10/24/2021 09:18:28 WYETT, NARINE ([id number]) -------------------------------------------------------------------------------- Physician Orders Details Patient Name: Martin, Riddle. Date of Service: 10/24/2021 8:00 AM Medical Record Number: [id number] Patient Account Number: [id number] Date of Birth/Sex: 07-31-1970 (51 y.o. M) Treating RN: Huel Coventry Primary Care Provider: Marletta Lor Other Clinician: Referring Provider: Marletta Lor Treating Provider/Extender: Rowan Blase in Treatment: 4 Verbal / Phone Orders: No Diagnosis Coding ICD-10 Coding Code Description E11.621 Type 2 diabetes mellitus with foot ulcer L97.522 Non-pressure chronic ulcer of other part of left foot with fat layer exposed L97.412 Non-pressure chronic ulcer of right heel and midfoot with fat layer exposed I87.2 Venous insufficiency (chronic) (peripheral) I89.0 Lymphedema, not elsewhere classified L97.822 Non-pressure chronic ulcer of other part of left lower leg with fat layer exposed I10 Essential (primary) hypertension Follow-up Appointments o Return Appointment in 1 week. o Nurse Visit as needed o Other: - Call the number to  schedule Vascular appt Bathing/ Shower/ Hygiene o May shower; gently cleanse wound with antibacterial soap, rinse and pat dry prior to dressing wounds o No tub bath. Anesthetic (Use 'Patient Medications' Section for Anesthetic Order Entry) o Lidocaine applied to wound bed Edema Control - Lymphedema / Segmental Compressive Device / Other o Elevate, Exercise Daily and Avoid Standing for Long Periods of Time. o Elevate legs to the level of the heart and pump ankles as often as possible o Elevate leg(s) parallel to the floor when sitting. o DO YOUR BEST to sleep in the bed at night. DO NOT sleep in your recliner. Long hours of sitting in a recliner leads to swelling of the legs and/or potential wounds on your backside. Additional Orders / Instructions o Follow Nutritious Diet and Increase Protein Intake - monitor blood sugar Medications-Please add to medication list. o P.O. Antibiotics - Continue Levaquin Wound Treatment Wound #1 - Calcaneus Wound Laterality: Right Cleanser: Soap and Water Discharge Instructions: Gently cleanse wound with antibacterial soap, rinse and pat dry prior to dressing wounds Primary Dressing: Silvercel 4 1/4x 4 1/4 (in/in) Discharge Instructions: Apply Silvercel 4 1/4x 4 1/4 (in/in) as instructed Secondary Dressing: Kerlix 4.5 x 4.1 (in/yd) Discharge Instructions: Apply Kerlix 4.5 x 4.1 (in/yd) as instructed Secured With: Medipore Tape - 28M Medipore H Soft Cloth Surgical Tape, 2x2 (in/yd) Secured With: Tubigrip Size E, 3.5x10 (in/yds) Discharge Instructions: Apply 3 Tubigrip E 3-finger-widths below knee to base of toes to secure dressing and/or for swelling. Wound #2 - Lower Leg Wound Laterality: Left Cleanser: Soap and Water BRAYON, BIELEFELD ([id number]) Discharge Instructions: Gently cleanse wound with antibacterial soap, rinse and pat dry prior to dressing wounds Primary Dressing: Silvercel 4 1/4x 4 1/4 (in/in) Discharge Instructions: Apply  Silvercel 4 1/4x 4 1/4 (in/in) as instructed Secondary Dressing: Kerlix 4.5 x 4.1 (in/yd) Discharge Instructions: Apply Kerlix 4.5 x 4.1 (  in/yd) as instructed Secured With: Medipore Tape - 20M Medipore H Soft Cloth Surgical Tape, 2x2 (in/yd) Secured With: Tubigrip Size E, 3.5x10 (in/yds) Discharge Instructions: Apply 3 Tubigrip E 3-finger-widths below knee to base of toes to secure dressing and/or for swelling. Electronic Signature(s) Signed: 10/24/2021 5:51:16 PM By: Lenda Kelp PA-C Signed: 03/07/2022 12:24:53 PM By: Betha Loa Entered By: Betha Loa on 10/24/2021 08:54:27 GRAISON, LEINBERGER ([id number]) -------------------------------------------------------------------------------- Problem List Details Patient Name: FUTURE, YELDELL. Date of Service: 10/24/2021 8:00 AM Medical Record Number: [id number] Patient Account Number: [id number] Date of Birth/Sex: 1971-03-01 (51 y.o. M) Treating RN: Huel Coventry Primary Care Provider: Marletta Lor Other Clinician: Referring Provider: Marletta Lor Treating Provider/Extender: Rowan Blase in Treatment: 4 Active Problems ICD-10 Encounter Code Description Active Date MDM Diagnosis E11.621 Type 2 diabetes mellitus with foot ulcer 09/26/2021 No Yes L97.522 Non-pressure chronic ulcer of other part of left foot with fat layer 09/26/2021 No Yes exposed L97.412 Non-pressure chronic ulcer of right heel and midfoot with fat layer 09/26/2021 No Yes exposed I87.2 Venous insufficiency (chronic) (peripheral) 09/26/2021 No Yes I89.0 Lymphedema, not elsewhere classified 09/26/2021 No Yes L97.822 Non-pressure chronic ulcer of other part of left lower leg with fat layer 09/26/2021 No Yes exposed I10 Essential (primary) hypertension 09/26/2021 No Yes Inactive Problems Resolved Problems Electronic Signature(s) Signed: 10/24/2021 8:29:58 AM By: Lenda Kelp PA-C Entered By: Lenda Kelp on 10/24/2021 08:29:58 Olin Hauser  ([id number]) -------------------------------------------------------------------------------- Progress Note Details Patient Name: Olin Hauser. Date of Service: 10/24/2021 8:00 AM Medical Record Number: [id number] Patient Account Number: [id number] Date of Birth/Sex: 1971/04/28 (51 y.o. M) Treating RN: Huel Coventry Primary Care Provider: Marletta Lor Other Clinician: Referring Provider: Marletta Lor Treating Provider/Extender: Rowan Blase in Treatment: 4 Subjective Chief Complaint Information obtained from Patient Bilateral foot ulcers and left leg ulcer History of Present Illness (HPI) 09-26-2021 upon evaluation today patient presents today for evaluation of wounds on the right heel, left lower leg, and left fifth metatarsal head location more plantar. With that being said these wounds have been present for since around June 09, 2021. Currently the patient has had dressings on that were unsure exactly how long they have been in place he has legs that appear to be extremely dry and he had a lot of dead tissue and dry skin buildup. It does make me concerned about the fact that he has not been changing these dressings appropriately nor anywhere close to regularly. Currently with regard to the wounds he does seem to be doing decently well all things considered. That is at least good news. I see no signs of obvious active infection at the moment. The patient's most recent ABI was on 05/31/2021 and was on the right 1.15 and left 1.02 there was no arterial study performed for completeness nor a TBI. This may be something to consider going forward. Patient has otherwise a history of diabetes mellitus type 2, chronic venous insufficiency, lymphedema, and hypertension. 10-17-2021 upon evaluation today patient actually appears to be doing well today with regard to his leg on the left I am very pleased both with the foot as well as the leg I think we are on the right track here. As far as the wound on  the right leg this is more circumferential and to be honest it actually has blue-green drainage consistent with Pseudomonas and appears to be significantly worse compared to last week's evaluation. I do believe that this is going to likely require some antibiotics I  Minna send this into the pharmacy for him. 10-24-2021 upon evaluation today patient appears to be doing a little bit better in regard to his leg with regard to the color it does not appear to be quite as red as before he still has a tremendous amount of weeping. I do believe compression therapy would be beneficial for him but we are still waiting on the vascular study which should be upcoming. He tells me the 27th is the date that he has been given. Fortunately I do not see any signs of systemic infection right now. Objective Constitutional Obese and well-hydrated in no acute distress. Vitals Time Taken: 8:26 AM, Height: 76 in, Weight: 330 lbs, BMI: 40.2, Temperature: 98.2 F, Pulse: 99 bpm, Respiratory Rate: 16 breaths/min, Blood Pressure: 167/80 mmHg. Respiratory normal breathing without difficulty. Psychiatric this patient is able to make decisions and demonstrates good insight into disease process. Alert and Oriented x 3. pleasant and cooperative. General Notes: Upon inspection patient's wound bed actually showed signs of good granulation and epithelization at this point. Fortunately I do not see any evidence of infection locally or systemically which is great and overall I am extremely pleased with where we stand currently. The good news is the plantar foot wound on the left is actually doing quite well. Integumentary (Hair, Skin) Wound #1 status is Open. Original cause of wound was Gradually Appeared. The date acquired was: 06/09/2021. The wound has been in treatment 4 weeks. The wound is located on the Right Calcaneus. The wound measures 3cm length x 1.2cm width x 0.2cm depth; 2.827cm^2 area and 0.565cm^3 volume. There is Fat  Layer (Subcutaneous Tissue) exposed. There is a medium amount of serous drainage noted. There is small (1-33%) pink granulation within the wound bed. There is a large (67-100%) amount of necrotic tissue within the wound bed including Adherent Slough. Wound #2 status is Open. Original cause of wound was Gradually Appeared. The date acquired was: 06/09/2021. The wound has been in treatment 4 weeks. The wound is located on the Left Lower Leg. The wound measures 3cm length x 6cm width x 0.1cm depth; 14.137cm^2 area and 1.414cm^3 volume. There is Fat Layer (Subcutaneous Tissue) exposed. There is a medium amount of serosanguineous drainage noted. There is RAZIEL, KOENIGS ([id number]) medium (34-66%) pink granulation within the wound bed. There is a medium (34-66%) amount of necrotic tissue within the wound bed including Adherent Slough. Wound #3 status is Healed - Epithelialized. Original cause of wound was Gradually Appeared. The date acquired was: 06/09/2021. The wound has been in treatment 4 weeks. The wound is located on the Left Metatarsal head fifth. The wound measures 0cm length x 0cm width x 0cm depth; 0cm^2 area and 0cm^3 volume. There is Fat Layer (Subcutaneous Tissue) exposed. There is a medium amount of serosanguineous drainage noted. There is no granulation within the wound bed. There is a large (67-100%) amount of necrotic tissue within the wound bed including Eschar. 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 Nurse Visit as needed Other: - Call the  number to schedule Vascular appt Bathing/ Shower/ Hygiene: May shower; gently cleanse wound with antibacterial soap, rinse  and pat dry prior to dressing wounds No tub bath. Anesthetic (Use 'Patient Medications' Section for Anesthetic Order Entry): Lidocaine applied to wound bed Edema Control - Lymphedema / Segmental Compressive Device / Other: Elevate, Exercise Daily and Avoid Standing for Long Periods of Time. Elevate legs to the level of the heart and pump ankles as often as possible Elevate leg(s) parallel to the floor when sitting. DO YOUR BEST to sleep in the bed at night. DO NOT sleep in your recliner. Long hours of sitting in a recliner leads to swelling of the legs and/or potential wounds on your backside. Additional Orders / Instructions: Follow Nutritious Diet and Increase Protein Intake - monitor blood sugar Medications-Please add to medication list.: P.O. Antibiotics - Continue Levaquin WOUND #1: - Calcaneus Wound Laterality: Right Cleanser: Soap and Water Discharge Instructions: Gently cleanse wound with antibacterial soap, rinse and pat dry prior to dressing wounds Primary Dressing: Silvercel 4 1/4x 4 1/4 (in/in) Discharge Instructions: Apply Silvercel 4 1/4x 4 1/4 (in/in) as instructed Secondary Dressing: Kerlix 4.5 x 4.1 (in/yd) Discharge Instructions: Apply Kerlix 4.5 x 4.1 (in/yd) as instructed Secured With: Medipore Tape - 1M Medipore H Soft Cloth Surgical Tape, 2x2 (in/yd) Secured With: Tubigrip Size E, 3.5x10 (in/yds) Discharge Instructions: Apply 3 Tubigrip E 3-finger-widths below knee to base of toes to secure dressing and/or for swelling. WOUND #2: - Lower Leg Wound Laterality: Left Cleanser: Soap and Water Discharge Instructions: Gently cleanse wound with antibacterial soap, rinse and pat dry prior to dressing wounds Primary Dressing: Silvercel 4 1/4x 4 1/4 (in/in) Discharge Instructions: Apply Silvercel 4 1/4x 4 1/4 (in/in) as instructed Secondary Dressing:  Kerlix 4.5 x 4.1 (in/yd) Discharge Instructions: Apply Kerlix 4.5 x 4.1 (in/yd) as instructed Secured With: Medipore Tape - 1M Medipore H Soft Cloth Surgical Tape, 2x2 (in/yd) Secured With: Tubigrip Size E, 3.5x10 (in/yds) Discharge Instructions: Apply 3 Tubigrip E 3-finger-widths below knee to base of toes to secure dressing and/or for swelling. 1. I would recommend that we going continue with the wound care measures as before and the patient is in agreement with plan this includes the use of the silver alginate dressing to all wounds which I think is doing well. 2. Also can recommend that we continue with ABD pads and roll gauze to secure in place. RASHIED, CORALLO ([id number]) 3. He will also continue with the Tubigrip he tells me this actually is feeling pretty good when he puts this on he is very pleased with how things are going he is just scared that this is going to continue to worsen. Hopefully that will not be the case. I do think if we can get him in compression wraps that will help to and I may even need to extend his antibiotics next week we will see how things are doing next week. 4. We are awaiting the arterial study in order to see if we can initiate compression therapy once we can do that I think things will improve significantly. We will see patient back for reevaluation in 1 week here in the clinic. If anything worsens or changes patient will contact our office for additional recommendations. Electronic Signature(s) Signed: 10/24/2021 9:19:26 AM By: Lenda Kelp PA-C Entered By: Lenda Kelp on 10/24/2021 09:19:26 GARRET, TEALE ([id number]) -------------------------------------------------------------------------------- SuperBill Details Patient Name: KLINE, BULTHUIS. Date of Service: 10/24/2021 Medical Record Number: [id number] Patient Account Number: [id number] Date of Birth/Sex: [birth year] (51 y.o. M) Treating RN: Huel Coventry Primary Care Provider: Marletta Lor Other  Clinician:  Referring Provider: Marletta LorBarr, Julie Treating Provider/Extender: Larrick DerryStone, Terrence Pizana Weeks in Treatment: 4 Diagnosis Coding ICD-10 Codes Code Description E11.621 Type 2 diabetes mellitus with foot ulcer L97.522 Non-pressure chronic ulcer of other part of left foot with fat layer exposed L97.412 Non-pressure chronic ulcer of right heel and midfoot with fat layer exposed I87.2 Venous insufficiency (chronic) (peripheral) I89.0 Lymphedema, not elsewhere classified L97.822 Non-pressure chronic ulcer of other part of left lower leg with fat layer exposed I10 Essential (primary) hypertension Facility Procedures CPT4 Code: [id number] Description: 99214 - WOUND CARE VISIT-LEV 4 EST PT Modifier: Quantity: 1 Physician Procedures CPT4 Code: [id number] Description: 99214 - WC PHYS LEVEL 4 - EST PT Modifier: Quantity: 1 CPT4 Code: Description: ICD-10 Diagnosis Description E11.621 Type 2 diabetes mellitus with foot ulcer L97.522 Non-pressure chronic ulcer of other part of left foot with fat layer e L97.412 Non-pressure chronic ulcer of right heel and midfoot with fat layer ex I87.2  Venous insufficiency (chronic) (peripheral) Modifier: xposed posed Quantity: Electronic Signature(s) Signed: 10/24/2021 9:19:54 AM By: Lenda KelpStone III, Hager Compston PA-C Entered By: Lenda KelpStone III, Hunter Pinkard on 10/24/2021 09:19:53

## 2021-10-29 ENCOUNTER — Encounter (INDEPENDENT_AMBULATORY_CARE_PROVIDER_SITE_OTHER): Payer: Medicare Other | Admitting: Nurse Practitioner

## 2021-10-31 ENCOUNTER — Encounter: Payer: Medicare Other | Admitting: Physician Assistant

## 2021-10-31 DIAGNOSIS — E11621 Type 2 diabetes mellitus with foot ulcer: Secondary | ICD-10-CM | POA: Diagnosis not present

## 2021-10-31 NOTE — Progress Notes (Addendum)
Martin Riddle, [place] C. ([id number]) Visit Report for 10/31/2021 Chief Complaint Document Details Patient Name: Martin Riddle, Chrishawn C. Date of Service: 10/31/2021 12:45 PM Medical Record Number: [id number] Patient Account Number: [id number] Date of Birth/Sex: 02/10/1971 (51 y.o. M) Treating RN: Huel CoventryWoody, Kim Primary Care Provider: Marletta LorBarr, Julie Other Clinician: Referring Provider: Marletta LorBarr, Julie Treating Provider/Extender: Rowan BlaseStone, Richetta Cubillos Weeks in Treatment: 5 Information Obtained from: Patient Chief Complaint Bilateral foot ulcers and left leg ulcer Electronic Signature(s) Signed: 10/31/2021 1:00:52 PM By: Lenda KelpStone III, Nicolo Tomko PA-C Entered By: Lenda KelpStone III, Wister Hoefle on 10/31/2021 13:00:52 Martin Riddle, Myshawn C. ([id number]) -------------------------------------------------------------------------------- Debridement Details Patient Name: Martin Riddle, [name] Date of Service: 10/31/2021 12:45 PM Medical Record Number: [id number] Patient Account Number: [id number] Date of Birth/Sex: 12/24/1970 (51 y.o. M) Treating RN: Laurina Bustlerisp, Clova Primary Care Provider: Marletta LorBarr, Julie Other Clinician: Referring Provider: Marletta LorBarr, Julie Treating Provider/Extender: Rowan BlaseStone, Elyssa Pendelton Weeks in Treatment: 5 Debridement Performed for Wound #2 Left Lower Leg Assessment: Performed By: Physician Nelida MeuseStone, Savio Albrecht E., PA-C Debridement Type: Debridement Severity of Tissue Pre Debridement: Fat layer exposed Level of Consciousness (Pre- Awake and Alert procedure): Pre-procedure Verification/Time Out Yes - 13:49 Taken: Start Time: 13:49 Pain Control: Lidocaine 4% Topical Solution Total Area Debrided (L x W): 3 (cm) x 1 (cm) = 3 (cm) Tissue and other material Viable, Non-Viable, Slough, Subcutaneous, Biofilm, Slough debrided: Level: Skin/Subcutaneous Tissue Debridement Description: Excisional Instrument: Curette Bleeding: Minimum Hemostasis Achieved: Pressure End Time: 13:50 Procedural Pain: 0 Post Procedural Pain: 0 Response to Treatment: Procedure was tolerated  well Level of Consciousness (Post- Awake and Alert procedure): Post Debridement Measurements of Total Wound Length: (cm) 3 Width: (cm) 1 Depth: (cm) 0.1 Volume: (cm) 0.236 Character of Wound/Ulcer Post Debridement: Improved Severity of Tissue Post Debridement: Fat layer exposed Post Procedure Diagnosis Same as Pre-procedure Electronic Signature(s) Signed: 10/31/2021 3:31:27 PM By: Laurina Bustlerisp, Clova Signed: 11/01/2021 4:54:58 PM By: Lenda KelpStone III, Lanis Storlie PA-C Entered By: Laurina Bustlerisp, Clova on 10/31/2021 13:50:38 Martin Riddle, [name] ([id number]) -------------------------------------------------------------------------------- Debridement Details Patient Name: Martin Riddle, Martin C. Date of Service: 10/31/2021 12:45 PM Medical Record Number: [id number] Patient Account Number: [id number] Date of Birth/Sex: 04/11/1971 (51 y.o. M) Treating RN: Laurina Bustlerisp, Clova Primary Care Provider: Marletta LorBarr, Julie Other Clinician: Referring Provider: Marletta LorBarr, Julie Treating Provider/Extender: Rowan BlaseStone, Marsha Hillman Weeks in Treatment: 5 Debridement Performed for Wound #1 Right Calcaneus Assessment: Performed By: Physician Nelida MeuseStone, Taegan Standage E., PA-C Debridement Type: Debridement Severity of Tissue Pre Debridement: Fat layer exposed Level of Consciousness (Pre- Awake and Alert procedure): Pre-procedure Verification/Time Out Yes - 13:46 Taken: Start Time: 13:46 Pain Control: Lidocaine 4% Topical Solution Total Area Debrided (L x W): 3.3 (cm) x 2 (cm) = 6.6 (cm) Tissue and other material Viable, Non-Viable, Slough, Subcutaneous, Biofilm, Slough debrided: Level: Skin/Subcutaneous Tissue Debridement Description: Excisional Instrument: Curette Bleeding: Minimum Hemostasis Achieved: Pressure End Time: 13:47 Procedural Pain: 0 Post Procedural Pain: 0 Response to Treatment: Procedure was tolerated well Level of Consciousness (Post- Awake and Alert procedure): Post Debridement Measurements of Total Wound Length: (cm) 3.3 Width: (cm) 2 Depth: (cm)  0.2 Volume: (cm) 1.037 Character of Wound/Ulcer Post Debridement: Improved Severity of Tissue Post Debridement: Fat layer exposed Post Procedure Diagnosis Same as Pre-procedure Electronic Signature(s) Signed: 10/31/2021 3:31:27 PM By: Laurina Bustlerisp, Clova Signed: 11/01/2021 4:54:58 PM By: Lenda KelpStone III, Naod Sweetland PA-C Entered By: Laurina Bustlerisp, Clova on 10/31/2021 13:50:58 Martin Riddle, Martin C. ([id number]) -------------------------------------------------------------------------------- HPI Details Patient Name: Martin Riddle, Martin C. Date of Service: 10/31/2021 12:45 PM Medical Record Number: [id number] Patient Account Number: [id number] Date of Birth/Sex: 12/06/1970 ([age] y.o. M) Treating RN: Huel CoventryWoody, Kim Primary Care Provider:  Marletta Lor Other Clinician: Referring Provider: Marletta Lor Treating Provider/Extender: Rowan Blase in Treatment: 5 History of Present Illness HPI Description: 09-26-2021 upon evaluation today patient presents today for evaluation of wounds on the right heel, left lower leg, and left fifth metatarsal head location more plantar. With that being said these wounds have been present for since around June 09, 2021. Currently the patient has had dressings on that were unsure exactly how long they have been in place he has legs that appear to be extremely dry and he had a lot of dead tissue and dry skin buildup. It does make me concerned about the fact that he has not been changing these dressings appropriately nor anywhere close to regularly. Currently with regard to the wounds he does seem to be doing decently well all things considered. That is at least good news. I see no signs of obvious active infection at the moment. The patient's most recent ABI was on 05/31/2021 and was on the right 1.15 and left 1.02 there was no arterial study performed for completeness nor a TBI. This may be something to consider going forward. Patient has otherwise a history of diabetes mellitus type 2, chronic venous  insufficiency, lymphedema, and hypertension. 10-17-2021 upon evaluation today patient actually appears to be doing well today with regard to his leg on the left I am very pleased both with the foot as well as the leg I think we are on the right track here. As far as the wound on the right leg this is more circumferential and to be honest it actually has blue-green drainage consistent with Pseudomonas and appears to be significantly worse compared to last week's evaluation. I do believe that this is going to likely require some antibiotics I Minna send this into the pharmacy for him. 10-24-2021 upon evaluation today patient appears to be doing a little bit better in regard to his leg with regard to the color it does not appear to be quite as red as before he still has a tremendous amount of weeping. I do believe compression therapy would be beneficial for him but we are still waiting on the vascular study which should be upcoming. He tells me the 27th is the date that he has been given. Fortunately I do not see any signs of systemic infection right now. 10-31-2021 upon evaluation today patient's wounds in general are showing signs of improvement. Fortunately there does not appear to be any evidence of active infection locally or systemically which is great news the left lateral lower extremity he has significantly improved. The right heel is doing better although there is some need for debridement at both locations. Electronic Signature(s) Signed: 10/31/2021 5:22:38 PM By: Lenda Kelp PA-C Entered By: Lenda Kelp on 10/31/2021 17:22:38 ZAXTON, ANGERER ([id number]) -------------------------------------------------------------------------------- Physical Exam Details Patient Name: DUANTE, AROCHO. Date of Service: 10/31/2021 12:45 PM Medical Record Number: [id number] Patient Account Number: [id number] Date of Birth/Sex: 12/06/1970 (51 y.o. M) Treating RN: Huel Coventry Primary Care Provider: Marletta Lor Other Clinician: Referring Provider: Marletta Lor Treating Provider/Extender: Marsland Derry Weeks in Treatment: 5 Constitutional Obese and well-hydrated in no acute distress. Respiratory normal breathing without difficulty. Psychiatric this patient is able to make decisions and demonstrates good insight into disease process. Alert and Oriented x 3. pleasant and cooperative. Notes Upon inspection patient's wound bed actually showed signs of good granulation and epithelization at this point. Fortunately I do not show any signs of active infection at this time  which is great news he is on the Levaquin I think that is helping. Overall my suggestion is good to be that we continue with the wound care measures as before once he gets the results of his arterial testing next week we will be able to more aggressively compress him which I think is really what he needs I would recommend a 4-layer compression wrap being optimal if he has blood flow to support this. Electronic Signature(s) Signed: 10/31/2021 5:23:14 PM By: Lenda Kelp PA-C Entered By: Lenda Kelp on 10/31/2021 17:23:13 DEONDRAY, OSPINA ([id number]) -------------------------------------------------------------------------------- Physician Orders Details Patient Name: MALEKO, GREULICH. Date of Service: 10/31/2021 12:45 PM Medical Record Number: [id number] Patient Account Number: [id number] Date of Birth/Sex: August 18, 1970 (51 y.o. M) Treating RN: Laurina Bustle Primary Care Provider: Marletta Lor Other Clinician: Referring Provider: Marletta Lor Treating Provider/Extender: Rowan Blase in Treatment: 5 Verbal / Phone Orders: No Diagnosis Coding ICD-10 Coding Code Description E11.621 Type 2 diabetes mellitus with foot ulcer L97.522 Non-pressure chronic ulcer of other part of left foot with fat layer exposed L97.412 Non-pressure chronic ulcer of right heel and midfoot with fat layer exposed I87.2 Venous insufficiency (chronic)  (peripheral) I89.0 Lymphedema, not elsewhere classified L97.822 Non-pressure chronic ulcer of other part of left lower leg with fat layer exposed I10 Essential (primary) hypertension Follow-up Appointments o Return Appointment in 1 week. o Nurse Visit as needed o Other: - Call the number to schedule Vascular appt Bathing/ Shower/ Hygiene o May shower; gently cleanse wound with antibacterial soap, rinse and pat dry prior to dressing wounds o No tub bath. Anesthetic (Use 'Patient Medications' Section for Anesthetic Order Entry) o Lidocaine applied to wound bed Edema Control - Lymphedema / Segmental Compressive Device / Other o Elevate, Exercise Daily and Avoid Standing for Long Periods of Time. o Elevate legs to the level of the heart and pump ankles as often as possible o Elevate leg(s) parallel to the floor when sitting. o DO YOUR BEST to sleep in the bed at night. DO NOT sleep in your recliner. Long hours of sitting in a recliner leads to swelling of the legs and/or potential wounds on your backside. Additional Orders / Instructions o Follow Nutritious Diet and Increase Protein Intake - monitor blood sugar Medications-Please add to medication list. o P.O. Antibiotics - Continue Levaquin Wound Treatment Wound #1 - Calcaneus Wound Laterality: Right Cleanser: Soap and Water 3 x Per Week/30 Days Discharge Instructions: Gently cleanse wound with antibacterial soap, rinse and pat dry prior to dressing wounds Primary Dressing: Silvercel 4 1/4x 4 1/4 (in/in) 3 x Per Week/30 Days Discharge Instructions: Apply Silvercel 4 1/4x 4 1/4 (in/in) as instructed Secondary Dressing: Kerlix 4.5 x 4.1 (in/yd) 3 x Per Week/30 Days Discharge Instructions: Apply Kerlix 4.5 x 4.1 (in/yd) as instructed Secured With: Medipore Tape - 30M Medipore H Soft Cloth Surgical Tape, 2x2 (in/yd) 3 x Per Week/30 Days Secured With: Tubigrip Size E, 3.5x10 (in/yds) 3 x Per Week/30 Days Discharge  Instructions: Apply 3 Tubigrip E 3-finger-widths below knee to base of toes to secure dressing and/or for swelling. Wound #2 - Lower Leg Wound Laterality: Left Cleanser: Soap and Water 3 x Per Week/30 Days ABDIRAHMAN, CHITTUM ([id number]) Discharge Instructions: Gently cleanse wound with antibacterial soap, rinse and pat dry prior to dressing wounds Primary Dressing: Silvercel 4 1/4x 4 1/4 (in/in) 3 x Per Week/30 Days Discharge Instructions: Apply Silvercel 4 1/4x 4 1/4 (in/in) as instructed Secondary Dressing: Kerlix 4.5 x 4.1 (in/yd) 3  x Per Week/30 Days Discharge Instructions: Apply Kerlix 4.5 x 4.1 (in/yd) as instructed Secured With: Medipore Tape - 60M Medipore H Soft Cloth Surgical Tape, 2x2 (in/yd) 3 x Per Week/30 Days Secured With: Tubigrip Size E, 3.5x10 (in/yds) 3 x Per Week/30 Days Discharge Instructions: Apply 3 Tubigrip E 3-finger-widths below knee to base of toes to secure dressing and/or for swelling. Electronic Signature(s) Signed: 10/31/2021 3:31:27 PM By: Laurina Bustle Signed: 11/01/2021 4:54:58 PM By: Lenda Kelp PA-C Entered By: Laurina Bustle on 10/31/2021 14:13:57 GRAEDEN, BITNER ([id number]) -------------------------------------------------------------------------------- Problem List Details Patient Name: MAUI, BRITTEN. Date of Service: 10/31/2021 12:45 PM Medical Record Number: [id number] Patient Account Number: [id number] Date of Birth/Sex: [birth year] (51 y.o. M) Treating RN: Huel Coventry Primary Care Provider: Marletta Lor Other Clinician: Referring Provider: Marletta Lor Treating Provider/Extender: Rowan Blase in Treatment: 5 Active Problems ICD-10 Encounter Code Description Active Date MDM Diagnosis E11.621 Type 2 diabetes mellitus with foot ulcer 09/26/2021 No Yes L97.522 Non-pressure chronic ulcer of other part of left foot with fat layer 09/26/2021 No Yes exposed L97.412 Non-pressure chronic ulcer of right heel and midfoot with fat layer 09/26/2021 No  Yes exposed I87.2 Venous insufficiency (chronic) (peripheral) 09/26/2021 No Yes I89.0 Lymphedema, not elsewhere classified 09/26/2021 No Yes L97.822 Non-pressure chronic ulcer of other part of left lower leg with fat layer 09/26/2021 No Yes exposed I10 Essential (primary) hypertension 09/26/2021 No Yes Inactive Problems Resolved Problems Electronic Signature(s) Signed: 10/31/2021 1:00:42 PM By: Lenda Kelp PA-C Entered By: Lenda Kelp on 10/31/2021 13:00:42 Martin Hauser ([id number]) -------------------------------------------------------------------------------- Progress Note Details Patient Name: Martin Hauser. Date of Service: 10/31/2021 12:45 PM Medical Record Number: [id number] Patient Account Number: [id number] Date of Birth/Sex: 1971-04-28 (51 y.o. M) Treating RN: Huel Coventry Primary Care Provider: Marletta Lor Other Clinician: Referring Provider: Marletta Lor Treating Provider/Extender: Rowan Blase in Treatment: 5 Subjective Chief Complaint Information obtained from Patient Bilateral foot ulcers and left leg ulcer History of Present Illness (HPI) 09-26-2021 upon evaluation today patient presents today for evaluation of wounds on the right heel, left lower leg, and left fifth metatarsal head location more plantar. With that being said these wounds have been present for since around June 09, 2021. Currently the patient has had dressings on that were unsure exactly how long they have been in place he has legs that appear to be extremely dry and he had a lot of dead tissue and dry skin buildup. It does make me concerned about the fact that he has not been changing these dressings appropriately nor anywhere close to regularly. Currently with regard to the wounds he does seem to be doing decently well all things considered. That is at least good news. I see no signs of obvious active infection at the moment. The patient's most recent ABI was on 05/31/2021 and was on the right  1.15 and left 1.02 there was no arterial study performed for completeness nor a TBI. This may be something to consider going forward. Patient has otherwise a history of diabetes mellitus type 2, chronic venous insufficiency, lymphedema, and hypertension. 10-17-2021 upon evaluation today patient actually appears to be doing well today with regard to his leg on the left I am very pleased both with the foot as well as the leg I think we are on the right track here. As far as the wound on the right leg this is more circumferential and to be honest it actually has blue-green drainage consistent with Pseudomonas and appears to  be significantly worse compared to last week's evaluation. I do believe that this is going to likely require some antibiotics I Minna send this into the pharmacy for him. 10-24-2021 upon evaluation today patient appears to be doing a little bit better in regard to his leg with regard to the color it does not appear to be quite as red as before he still has a tremendous amount of weeping. I do believe compression therapy would be beneficial for him but we are still waiting on the vascular study which should be upcoming. He tells me the 27th is the date that he has been given. Fortunately I do not see any signs of systemic infection right now. 10-31-2021 upon evaluation today patient's wounds in general are showing signs of improvement. Fortunately there does not appear to be any evidence of active infection locally or systemically which is great news the left lateral lower extremity he has significantly improved. The right heel is doing better although there is some need for debridement at both locations. Objective Constitutional Obese and well-hydrated in no acute distress. Vitals Time Taken: 1:45 PM, Height: 76 in, Source: Stated, Weight: 330 lbs, BMI: 40.2, Temperature: 98.3 F, Pulse: 76 bpm, Respiratory Rate: 16 breaths/min, Blood Pressure: 136/79 mmHg. Respiratory normal  breathing without difficulty. Psychiatric this patient is able to make decisions and demonstrates good insight into disease process. Alert and Oriented x 3. pleasant and cooperative. General Notes: Upon inspection patient's wound bed actually showed signs of good granulation and epithelization at this point. Fortunately I do not show any signs of active infection at this time which is great news he is on the Levaquin I think that is helping. Overall my suggestion is good to be that we continue with the wound care measures as before once he gets the results of his arterial testing next week we will be able to more aggressively compress him which I think is really what he needs I would recommend a 4-layer compression wrap being optimal if he has blood flow to support this. Integumentary (Hair, Skin) Wound #1 status is Open. Original cause of wound was Gradually Appeared. The date acquired was: 06/09/2021. The wound has been in treatment 5 weeks. The wound is located on the Right Calcaneus. The wound measures 3.3cm length x 2cm width x 0.2cm depth; 5.184cm^2 area and 1.037cm^3 volume. There is Fat Layer (Subcutaneous Tissue) exposed. There is a medium amount of serous drainage noted. There is small BEECHER, FURIO C. ([id number]) (1-33%) pink granulation within the wound bed. There is a large (67-100%) amount of necrotic tissue within the wound bed including Adherent Slough. Wound #2 status is Open. Original cause of wound was Gradually Appeared. The date acquired was: 06/09/2021. The wound has been in treatment 5 weeks. The wound is located on the Left Lower Leg. The wound measures 3cm length x 1cm width x 0.1cm depth; 2.356cm^2 area and 0.236cm^3 volume. There is Fat Layer (Subcutaneous Tissue) exposed. There is a medium amount of serosanguineous drainage noted. There is medium (34-66%) pink granulation within the wound bed. There is a medium (34-66%) amount of necrotic tissue within the wound bed  including Adherent Slough. Other Condition(s) Patient presents with Lymphedema located on the Right Leg. 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 There was a Excisional Skin/Subcutaneous Tissue Debridement with a total area of 6.6 sq cm performed by Nelida Meuse., PA-C. With the following instrument(s): Curette to remove Viable and Non-Viable tissue/material. Material removed includes Subcutaneous Tissue, Slough, and Biofilm after achieving pain control using Lidocaine 4% Topical Solution. No specimens were taken. A time out was conducted at 13:46, prior to the start of the procedure. A Minimum amount of bleeding was controlled with Pressure. The procedure was tolerated well with a pain level of 0 throughout and a pain level of 0 following the procedure. Post Debridement Measurements: 3.3cm length x 2cm width x 0.2cm depth; 1.037cm^3 volume. Character of Wound/Ulcer Post Debridement is improved. Severity of Tissue Post Debridement is: Fat layer exposed. Post procedure Diagnosis Wound #1: Same as Pre-Procedure Wound #2 Pre-procedure diagnosis of Wound #2 is a Diabetic Wound/Ulcer of the Lower Extremity located on the Left Lower Leg .Severity of Tissue Pre Debridement is: Fat layer exposed. There was a Excisional Skin/Subcutaneous Tissue Debridement with a total  area of 3 sq cm performed by Nelida Meuse., PA-C. With the following instrument(s): Curette to remove Viable and Non-Viable tissue/material. Material removed includes Subcutaneous Tissue, Slough, and Biofilm after achieving pain control using Lidocaine 4% Topical Solution. No specimens were taken. A time out was conducted at 13:49, prior to the start of the procedure. A Minimum amount of bleeding was controlled with Pressure. The procedure was tolerated well with a pain level of 0 throughout and a pain level of 0 following the procedure. Post Debridement Measurements: 3cm length x 1cm width x 0.1cm depth; 0.236cm^3 volume. Character of Wound/Ulcer Post Debridement is improved. Severity of Tissue Post Debridement is: Fat layer exposed. Post procedure Diagnosis Wound #2: Same as Pre-Procedure Plan Follow-up Appointments: Return Appointment in 1 week. Nurse Visit as needed Other: - Call the number to schedule Vascular appt Bathing/ Shower/ Hygiene: May shower; gently cleanse wound with antibacterial soap, rinse and pat dry prior to dressing wounds No tub bath. Anesthetic (Use 'Patient Medications' Section for Anesthetic Order Entry): Lidocaine applied to wound bed JEHAN, BONANO ([id number]) Edema Control - Lymphedema / Segmental Compressive Device / Other: Elevate, Exercise Daily and Avoid Standing for Long Periods of Time. Elevate legs to the level of the heart and pump ankles as often as possible Elevate leg(s) parallel to the floor when sitting. DO YOUR BEST to sleep in the bed at night. DO NOT sleep in your recliner. Long hours of sitting in a recliner leads to swelling of the legs and/or potential wounds on your backside. Additional Orders / Instructions: Follow Nutritious Diet and Increase Protein Intake - monitor blood sugar Medications-Please add to medication list.: P.O. Antibiotics - Continue Levaquin WOUND #1: - Calcaneus Wound Laterality: Right Cleanser: Soap and Water 3 x  Per Week/30 Days Discharge Instructions: Gently cleanse wound with antibacterial soap, rinse and pat dry prior to dressing wounds Primary Dressing: Silvercel 4 1/4x 4 1/4 (in/in) 3 x Per Week/30 Days Discharge Instructions: Apply Silvercel 4 1/4x 4 1/4 (in/in) as instructed Secondary Dressing: Kerlix 4.5 x 4.1 (in/yd) 3 x Per Week/30 Days Discharge Instructions: Apply Kerlix 4.5 x 4.1 (in/yd) as instructed Secured With: Medipore Tape -  11M Medipore H Soft Cloth Surgical Tape, 2x2 (in/yd) 3 x Per Week/30 Days Secured With: Tubigrip Size E, 3.5x10 (in/yds) 3 x Per Week/30 Days Discharge Instructions: Apply 3 Tubigrip E 3-finger-widths below knee to base of toes to secure dressing and/or for swelling. WOUND #2: - Lower Leg Wound Laterality: Left Cleanser: Soap and Water 3 x Per Week/30 Days Discharge Instructions: Gently cleanse wound with antibacterial soap, rinse and pat dry prior to dressing wounds Primary Dressing: Silvercel 4 1/4x 4 1/4 (in/in) 3 x Per Week/30 Days Discharge Instructions: Apply Silvercel 4 1/4x 4 1/4 (in/in) as instructed Secondary Dressing: Kerlix 4.5 x 4.1 (in/yd) 3 x Per Week/30 Days Discharge Instructions: Apply Kerlix 4.5 x 4.1 (in/yd) as instructed Secured With: Medipore Tape - 11M Medipore H Soft Cloth Surgical Tape, 2x2 (in/yd) 3 x Per Week/30 Days Secured With: Tubigrip Size E, 3.5x10 (in/yds) 3 x Per Week/30 Days Discharge Instructions: Apply 3 Tubigrip E 3-finger-widths below knee to base of toes to secure dressing and/or for swelling. 1. I would recommend currently that we go ahead and continue with the wound care measures as before and the patient is in agreement with that plan. This includes the use of the silver alginate dressing to the open wounds followed by roll gauze to secure in place. 2. Begin to continue with the Tubigrip to help with compression although I would really like to get a 3 or even potentially a 4-layer compression on him if he has blood flow  supports that we will see what his arterial studies show next week. We will see patient back for reevaluation in 1 week here in the clinic. If anything worsens or changes patient will contact our office for additional recommendations. Electronic Signature(s) Signed: 10/31/2021 5:23:45 PM By: Lenda Kelp PA-C Entered By: Lenda Kelp on 10/31/2021 17:23:45 MEKAI, WILKINSON ([id number]) -------------------------------------------------------------------------------- SuperBill Details Patient Name: IZAAN, KINGBIRD. Date of Service: 10/31/2021 Medical Record Number: [id number] Patient Account Number: [id number] Date of Birth/Sex: 1970-06-24 (51 y.o. 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 layer exposed I87.2 Venous insufficiency (chronic) (peripheral) I89.0 Lymphedema, not elsewhere classified L97.822 Non-pressure chronic ulcer of other part of left lower leg with fat layer exposed I10 Essential (primary) hypertension Facility Procedures CPT4 Code: 21308657 Description: 11042 - DEB SUBQ TISSUE 20 SQ CM/< Modifier: Quantity: 1 CPT4 Code: Description: ICD-10 Diagnosis Description L97.412 Non-pressure chronic ulcer of right heel and midfoot with fat layer expos L97.822 Non-pressure chronic ulcer of other part of left lower leg with fat layer Modifier: ed exposed Quantity: Physician Procedures CPT4 Code: 8469629 Description: 11042 - WC PHYS SUBQ TISS 20 SQ CM Modifier: Quantity: 1 CPT4 Code: Description: ICD-10 Diagnosis Description L97.412 Non-pressure chronic ulcer of right heel and midfoot with fat  layer expos L97.822 Non-pressure chronic ulcer of other part of left lower leg with fat layer Modifier: ed exposed Quantity: Electronic Signature(s) Signed: 10/31/2021 5:24:06 PM By: Lenda Kelp PA-C Entered By: Lenda Kelp on 10/31/2021 17:24:06

## 2021-10-31 NOTE — Progress Notes (Signed)
ATILANO, COVELLI ([id number]) Visit Report for 10/31/2021 Arrival Information Details Patient Name: Martin Riddle, Martin Riddle. Date of Service: 10/31/2021 12:45 PM Medical Record Number: [id number] Patient Account Number: [id number] Date of Birth/Sex: 08-Dec-1970 (51 y.o. M) Treating RN: Laurina Bustle Primary Care Khayman Kirsch: Marletta Lor Other Clinician: Referring Jerard Bays: Marletta Lor Treating Lealand Elting/Extender: Rowan Blase in Treatment: 5 Visit Information History Since Last Visit Added or deleted any medications: No Patient Arrived: Ambulatory Any new allergies or adverse reactions: No Arrival Time: 12:46 Had a fall or experienced change in No Accompanied By: Wife activities of daily living that may affect Transfer Assistance: None risk of falls: Patient Requires Transmission-Based No Hospitalized since last visit: No Precautions: Has Dressing in Place as Prescribed: No Patient Has Alerts: Yes Pain Present Now: Yes Patient Alerts: Patient on Blood Thinner DIABETIC aspirin  Electronic Signature(s) Signed: 10/31/2021 3:31:27 PM By: Laurina Bustle Entered By: Laurina Bustle on 10/31/2021 12:56:27 Martin Riddle ([id number]) -------------------------------------------------------------------------------- Clinic Level of Care Assessment Details Patient Name: Martin Riddle. Date of Service: 10/31/2021 12:45 PM Medical Record Number: [id number] Patient Account Number: [id number] Date of Birth/Sex: 29-Jul-1970 (51 y.o. M) Treating RN: Laurina Bustle Primary Care Brendalyn Vallely: Marletta Lor Other Clinician: Referring Ruthmary Occhipinti: Marletta Lor Treating Kadeisha Betsch/Extender: Rowan Blase in Treatment: 5 Clinic Level of Care Assessment Items TOOL 1 Quantity Score  - Use when EandM and Procedure is performed on INITIAL visit 0 ASSESSMENTS - Nursing Assessment / Reassessment  - General Physical Exam (combine w/ comprehensive assessment (listed just below) when performed on new 0 pt. evals)  -  0 Comprehensive Assessment (HX, ROS, Risk Assessments, Wounds Hx, etc.) ASSESSMENTS - Wound and Skin Assessment / Reassessment  - Dermatologic / Skin Assessment (not related to wound area) 0 ASSESSMENTS - Ostomy and/or Continence Assessment and Care  - Incontinence Assessment and Management 0  - 0 Ostomy Care Assessment and Management (repouching, etc.) PROCESS - Coordination of Care  - Simple Patient / Family Education for ongoing care 0  - 0 Complex (extensive) Patient / Family Education for ongoing care  - 0 Staff obtains Chiropractor, Records, Test Results / Process Orders  - 0 Staff telephones HHA, Nursing Homes / Clarify orders / etc  - 0 Routine Transfer to another Facility (non-emergent condition)  - 0 Routine Hospital Admission (non-emergent condition)  - 0 New Admissions / Manufacturing engineer / Ordering NPWT, Apligraf, etc.  - 0 Emergency Hospital Admission (emergent condition) PROCESS - Special Needs  - Pediatric / Minor Patient Management 0  - 0 Isolation Patient Management  - 0 Hearing / Language / Visual special needs  - 0 Assessment of Community assistance (transportation, D/C planning, etc.)  - 0 Additional assistance / Altered mentation  - 0 Support Surface(s) Assessment (bed, cushion, seat, etc.) INTERVENTIONS - Miscellaneous  - External ear exam 0  - 0 Patient Transfer (multiple staff / Nurse, adult / Similar devices)  - 0 Simple Staple / Suture removal (25 or less)  - 0 Complex Staple / Suture removal (26 or more)  - 0 Hypo/Hyperglycemic Management (do not check if billed separately)  - 0 Ankle / Brachial Index (ABI) - do not check if billed separately Has the patient been seen at the hospital within the last three years: Yes Total Score: 0 Level Of Care: ____ Martin Riddle ([id number]) Electronic Signature(s) Signed: 10/31/2021 3:31:27 PM By: Laurina Bustle Entered By: Laurina Bustle on 10/31/2021  14:14:08 Martin Riddle ([id number]) -------------------------------------------------------------------------------- Encounter Discharge Information Details Patient Name: Martin Riddle, Martin Riddle.  Date of Service: 10/31/2021 12:45 PM Medical Record Number: [id number] Patient Account Number: [id number] Date of Birth/Sex: 06-23-1970 (51 y.o. M) Treating RN: Laurina Bustle Primary Care Josie Mesa: Marletta Lor Other Clinician: Referring Elleanna Melling: Marletta Lor Treating Aerabella Galasso/Extender: Rowan Blase in Treatment: 5 Encounter Discharge Information Items Post Procedure Vitals Discharge Condition: Stable Temperature (F): 98.3 Ambulatory Status: Cane Pulse (bpm): 76 Discharge Destination: Home Respiratory Rate (breaths/min): 16 Transportation: Private Auto Blood Pressure (mmHg): 136/79 Accompanied By: Wife Schedule Follow-up Appointment: Yes Clinical Summary of Care: Electronic Signature(s) Signed: 10/31/2021 3:31:27 PM By: Laurina Bustle Entered By: Laurina Bustle on 10/31/2021 14:15:59 Martin Riddle ([id number]) -------------------------------------------------------------------------------- Lower Extremity Assessment Details Patient Name: Martin Riddle. Date of Service: 10/31/2021 12:45 PM Medical Record Number: [id number] Patient Account Number: [id number] Date of Birth/Sex: [birth year] (51 y.o. M) Treating RN: Laurina Bustle Primary Care Kyndal Heringer: Marletta Lor Other Clinician: Referring Oluwaseyi Raffel: Marletta Lor Treating Isamar Nazir/Extender: Deiter Derry Weeks in Treatment: 5 Edema Assessment Assessed: [Left: No] [Right: No] Edema: [Left: Yes] [Right: Yes] Calf Left: Right: Point of Measurement: From Medial Instep 51.3 cm 54 cm Ankle Left: Right: Point of Measurement: From Medial Instep 30 cm 33 cm Vascular Assessment Pulses: Dorsalis Pedis Doppler Audible: [Left:Yes] [Right:Yes] Electronic Signature(s) Signed: 10/31/2021 3:31:27 PM By: Laurina Bustle Entered By: Laurina Bustle on 10/31/2021  13:31:50 Martin Riddle ([id number]) -------------------------------------------------------------------------------- Multi Wound Chart Details Patient Name: Martin Riddle. Date of Service: 10/31/2021 12:45 PM Medical Record Number: [id number] Patient Account Number: [id number] Date of Birth/Sex: 1970-10-03 (51 y.o. M) Treating RN: Laurina Bustle Primary Care Avaline Stillson: Marletta Lor Other Clinician: Referring Grey Rakestraw: Marletta Lor Treating Delmar Dondero/Extender: Rowan Blase in Treatment: 5 Vital Signs Height(in): 76 Pulse(bpm): 76 Weight(lbs): 330 Blood Pressure(mmHg): 136/79 Body Mass Index(BMI): 40.2 Temperature(F): 98.3 Respiratory Rate(breaths/min): 16 Photos: [N/A:N/A] Wound Location: Right Calcaneus Left Lower Leg N/A Wounding Event: Gradually Appeared Gradually Appeared N/A Primary Etiology: Diabetic Wound/Ulcer of the Lower Diabetic Wound/Ulcer of the Lower N/A Extremity Extremity Comorbid History: Hypertension, Type II Diabetes Hypertension, Type II Diabetes N/A Date Acquired: 06/09/2021 06/09/2021 N/A Weeks of Treatment: 5 5 N/A Wound Status: Open Open N/A Wound Recurrence: No No N/A Clustered Wound: Yes No N/A Clustered Quantity: 2 N/A N/A Measurements L x W x D (cm) 3.3x2x0.2 3x1x0.1 N/A Area (cm) : 5.184 2.356 N/A Volume (cm) : 1.037 0.236 N/A % Reduction in Area: -230.00% 70.00% N/A % Reduction in Volume: -560.50% 69.90% N/A Classification: Grade 2 Grade 2 N/A Exudate Amount: Medium Medium N/A Exudate Type: Serous Serosanguineous N/A Exudate Color: amber red, brown N/A Granulation Amount: Small (1-33%) Medium (34-66%) N/A Granulation Quality: Pink Pink N/A Necrotic Amount: Large (67-100%) Medium (34-66%) N/A Exposed Structures: Fat Layer (Subcutaneous Tissue): Fat Layer (Subcutaneous Tissue): N/A Yes Yes Fascia: No Fascia: No Tendon: No Tendon: No Muscle: No Muscle: No Joint: No Joint: No Bone: No Bone: No Epithelialization: None None N/A Treatment  Notes Electronic Signature(s) Signed: 10/31/2021 3:31:27 PM By: Laurina Bustle Entered By: Laurina Bustle on 10/31/2021 13:41:21 Martin Riddle ([id number]) -------------------------------------------------------------------------------- Multi-Disciplinary Care Plan Details Patient Name: Martin Riddle, Martin Riddle. Date of Service: 10/31/2021 12:45 PM Medical Record Number: [id number] Patient Account Number: [id number] Date of Birth/Sex: Mar 24, 1971 (51 y.o. M) Treating RN: Laurina Bustle Primary Care Jordane Hisle: Marletta Lor Other Clinician: Referring Gadiel John: Marletta Lor Treating Cataleyah Colborn/Extender: Rowan Blase in Treatment: 5 Active Inactive Nutrition Nursing Diagnoses: Potential for alteratiion in Nutrition/Potential for imbalanced nutrition Goals: Patient/caregiver verbalizes understanding of need to maintain therapeutic glucose control per primary care physician Date Initiated: 09/26/2021  Target Resolution Date: 10/26/2021 Goal Status: Active Interventions: Assess HgA1c results as ordered upon admission and as needed Assess patient nutrition upon admission and as needed per policy Notes: Soft Tissue Infection Nursing Diagnoses: Impaired tissue integrity Knowledge deficit related to disease process and management Knowledge deficit related to home infection control: handwashing, handling of soiled dressings, supply storage Potential for infection: soft tissue Goals: Patient's soft tissue infection will resolve Date Initiated: 10/24/2021 Target Resolution Date: 10/31/2021 Goal Status: Active Interventions: Assess signs and symptoms of infection every visit Provide education on infection Treatment Activities: Education provided on Infection : 10/24/2021 Notes: Wound/Skin Impairment Nursing Diagnoses: Knowledge deficit related to ulceration/compromised skin integrity Goals: Patient/caregiver will verbalize understanding of skin care regimen Date Initiated: 09/26/2021 Target Resolution Date:  10/26/2021 Goal Status: Active Ulcer/skin breakdown will have a volume reduction of 30% by week 4 Date Initiated: 09/26/2021 Target Resolution Date: 10/26/2021 Goal Status: Active Ulcer/skin breakdown will have a volume reduction of 50% by week 8 Date Initiated: 09/26/2021 Target Resolution Date: 11/26/2021 Goal Status: Active Ulcer/skin breakdown will have a volume reduction of 80% by week 12 Martin Riddle, Martin Riddle ([id number]) Date Initiated: 09/26/2021 Target Resolution Date: 12/26/2021 Goal Status: Active Ulcer/skin breakdown will heal within 14 weeks Date Initiated: 09/26/2021 Target Resolution Date: 01/26/2022 Goal Status: Active Interventions: Assess patient/caregiver ability to obtain necessary supplies Assess patient/caregiver ability to perform ulcer/skin care regimen upon admission and as needed Assess ulceration(s) every visit Notes: Electronic Signature(s) Signed: 10/31/2021 3:31:27 PM By: Laurina Bustle Entered By: Laurina Bustle on 10/31/2021 13:41:08 Martin Riddle ([id number]) -------------------------------------------------------------------------------- Non-Wound Condition Assessment Details Patient Name: Martin Riddle. Date of Service: 10/31/2021 12:45 PM Medical Record Number: [id number] Patient Account Number: [id number] Date of Birth/Sex: Jan 07, 1971 (51 y.o. M) Treating RN: Laurina Bustle Primary Care Jimy Gates: Marletta Lor Other Clinician: Referring Abbigayle Toole: Marletta Lor Treating Trica Usery/Extender: Varin Derry Weeks in Treatment: 5 Non-Wound Condition: Condition: Lymphedema Location: Leg Side: Right Photos Notes Non wound area to posterior right leg weeping Electronic Signature(s) Signed: 10/31/2021 3:31:27 PM By: Laurina Bustle Entered By: Laurina Bustle on 10/31/2021 13:40:42 Martin Riddle ([id number]) -------------------------------------------------------------------------------- Pain Assessment Details Patient Name: Martin Riddle. Date of Service: 10/31/2021 12:45  PM Medical Record Number: [id number] Patient Account Number: [id number] Date of Birth/Sex: 05/28/1971 (51 y.o. M) Treating RN: Laurina Bustle Primary Care Jamyiah Labella: Marletta Lor Other Clinician: Referring Calvin Chura: Marletta Lor Treating Jacelynn Hayton/Extender: Rowan Blase in Treatment: 5 Active Problems Location of Pain Severity and Description of Pain Patient Has Paino Yes Site Locations With Dressing Change: No Rate the pain. Current Pain Level: 6 Worst Pain Level: 8 Pain Management and Medication Current Pain Management: Electronic Signature(s) Signed: 10/31/2021 3:31:27 PM By: Laurina Bustle Entered By: Laurina Bustle on 10/31/2021 12:58:12 KHANI, PAINO ([id number]) -------------------------------------------------------------------------------- Wound Assessment Details Patient Name: Martin Riddle, Martin Riddle. Date of Service: 10/31/2021 12:45 PM Medical Record Number: [id number] Patient Account Number: [id number] Date of Birth/Sex: December 25, 1970 (51 y.o. M) Treating RN: Laurina Bustle Primary Care Ryelle Ruvalcaba: Marletta Lor Other Clinician: Referring Iwalani Templeton: Marletta Lor Treating Shauntell Iglesia/Extender: Bjorn Derry Weeks in Treatment: 5 Wound Status Wound Number: 1 Primary Etiology: Diabetic Wound/Ulcer of the Lower Extremity Wound Location: Right Calcaneus Wound Status: Open Wounding Event: Gradually Appeared Comorbid History: Hypertension, Type II Diabetes Date Acquired: 06/09/2021 Weeks Of Treatment: 5 Clustered Wound: Yes Photos Wound Measurements Length: (cm) 3.3 Width: (cm) 2 Depth: (cm) 0.2 Clustered Quantity: 2 Area: (cm) 5.184 Volume: (cm) 1.037 % Reduction in Area: -230% % Reduction in Volume: -560.5% Epithelialization: None Wound Description Classification:  Grade 2 Exudate Amount: Medium Exudate Type: Serous Exudate Color: amber Foul Odor After Cleansing: No Slough/Fibrino Yes Wound Bed Granulation Amount: Small (1-33%) Exposed Structure Granulation Quality: Pink Fascia  Exposed: No Necrotic Amount: Large (67-100%) Fat Layer (Subcutaneous Tissue) Exposed: Yes Necrotic Quality: Adherent Slough Tendon Exposed: No Muscle Exposed: No Joint Exposed: No Bone Exposed: No Treatment Notes Wound #1 (Calcaneus) Wound Laterality: Right Cleanser Soap and Water Discharge Instruction: Gently cleanse wound with antibacterial soap, rinse and pat dry prior to dressing wounds Peri-Wound Care Martin HauserLLEN, Martin C. ([id number]) Topical Primary Dressing Silvercel 4 1/4x 4 1/4 (in/in) Discharge Instruction: Apply Silvercel 4 1/4x 4 1/4 (in/in) as instructed Secondary Dressing Kerlix 4.5 x 4.1 (in/yd) Discharge Instruction: Apply Kerlix 4.5 x 4.1 (in/yd) as instructed Secured With Medipore Tape - 66M Medipore H Soft Cloth Surgical Tape, 2x2 (in/yd) Tubigrip Size E, 3.5x10 (in/yds) Discharge Instruction: Apply 3 Tubigrip E 3-finger-widths below knee to base of toes to secure dressing and/or for swelling. Compression Wrap Compression Stockings Add-Ons Electronic Signature(s) Signed: 10/31/2021 3:31:27 PM By: Laurina Bustlerisp, Clova Entered By: Laurina Bustlerisp, Clova on 10/31/2021 13:38:16 Martin HauserLLEN, Martin C. ([id number]) -------------------------------------------------------------------------------- Wound Assessment Details Patient Name: Martin HauserLLEN, Martin C. Date of Service: 10/31/2021 12:45 PM Medical Record Number: [id number] Patient Account Number: [id number] Date of Birth/Sex: 07/23/1970 (51 y.o. M) Treating RN: Laurina Bustlerisp, Clova Primary Care Sumayyah Custodio: Marletta LorBarr, Julie Other Clinician: Referring Glenette Bookwalter: Marletta LorBarr, Julie Treating Khori Rosevear/Extender: Osorto DerryStone, Hoyt Weeks in Treatment: 5 Wound Status Wound Number: 2 Primary Etiology: Diabetic Wound/Ulcer of the Lower Extremity Wound Location: Left Lower Leg Wound Status: Open Wounding Event: Gradually Appeared Comorbid History: Hypertension, Type II Diabetes Date Acquired: 06/09/2021 Weeks Of Treatment: 5 Clustered Wound: No Photos Wound Measurements Length: (cm)  3 Width: (cm) 1 Depth: (cm) 0.1 Area: (cm) 2.356 Volume: (cm) 0.236 % Reduction in Area: 70% % Reduction in Volume: 69.9% Epithelialization: None Wound Description Classification: Grade 2 Exudate Amount: Medium Exudate Type: Serosanguineous Exudate Color: red, brown Foul Odor After Cleansing: No Slough/Fibrino Yes Wound Bed Granulation Amount: Medium (34-66%) Exposed Structure Granulation Quality: Pink Fascia Exposed: No Necrotic Amount: Medium (34-66%) Fat Layer (Subcutaneous Tissue) Exposed: Yes Necrotic Quality: Adherent Slough Tendon Exposed: No Muscle Exposed: No Joint Exposed: No Bone Exposed: No Treatment Notes Wound #2 (Lower Leg) Wound Laterality: Left Cleanser Soap and Water Discharge Instruction: Gently cleanse wound with antibacterial soap, rinse and pat dry prior to dressing wounds Peri-Wound Care Martin HauserLLEN, Martin C. ([id number]) Topical Primary Dressing Silvercel 4 1/4x 4 1/4 (in/in) Discharge Instruction: Apply Silvercel 4 1/4x 4 1/4 (in/in) as instructed Secondary Dressing Kerlix 4.5 x 4.1 (in/yd) Discharge Instruction: Apply Kerlix 4.5 x 4.1 (in/yd) as instructed Secured With Medipore Tape - 66M Medipore H Soft Cloth Surgical Tape, 2x2 (in/yd) Tubigrip Size E, 3.5x10 (in/yds) Discharge Instruction: Apply 3 Tubigrip E 3-finger-widths below knee to base of toes to secure dressing and/or for swelling. Compression Wrap Compression Stockings Add-Ons Electronic Signature(s) Signed: 10/31/2021 3:31:27 PM By: Laurina Bustlerisp, Clova Entered By: Laurina Bustlerisp, Clova on 10/31/2021 13:26:36 Martin HauserLLEN, Martin C. ([id number]) -------------------------------------------------------------------------------- Vitals Details Patient Name: Martin HauserLLEN, Martin C. Date of Service: 10/31/2021 12:45 PM Medical Record Number: [id number] Patient Account Number: [id number] Date of Birth/Sex: 12/09/1970 (51 y.o. M) Treating RN: Laurina Bustlerisp, Clova Primary Care Seri Kimmer: Marletta LorBarr, Julie Other Clinician: Referring  Kallen Mccrystal: Marletta LorBarr, Julie Treating Adelbert Gaspard/Extender: Rowan BlaseStone, Hoyt Weeks in Treatment: 5 Vital Signs Time Taken: 13:45 Temperature (F): 98.3 Height (in): 76 Pulse (bpm): 76 Source: Stated Respiratory Rate (breaths/min): 16 Weight (lbs): 330 Blood Pressure (mmHg): 136/79 Body Mass Index (BMI): 40.2  Reference Range: 80 - 120 mg / dl Electronic Signature(s) Signed: 10/31/2021 3:31:27 PM By: Laurina Bustle Entered By: Laurina Bustle on 10/31/2021 12:57:10

## 2021-11-08 ENCOUNTER — Encounter: Payer: Medicare Other | Attending: Physician Assistant | Admitting: Physician Assistant

## 2021-11-08 ENCOUNTER — Other Ambulatory Visit: Payer: Self-pay | Admitting: Student

## 2021-11-08 DIAGNOSIS — L97522 Non-pressure chronic ulcer of other part of left foot with fat layer exposed: Secondary | ICD-10-CM | POA: Insufficient documentation

## 2021-11-08 DIAGNOSIS — I890 Lymphedema, not elsewhere classified: Secondary | ICD-10-CM | POA: Diagnosis not present

## 2021-11-08 DIAGNOSIS — L97822 Non-pressure chronic ulcer of other part of left lower leg with fat layer exposed: Secondary | ICD-10-CM | POA: Insufficient documentation

## 2021-11-08 DIAGNOSIS — L97412 Non-pressure chronic ulcer of right heel and midfoot with fat layer exposed: Secondary | ICD-10-CM | POA: Insufficient documentation

## 2021-11-08 DIAGNOSIS — Z6841 Body Mass Index (BMI) 40.0 and over, adult: Secondary | ICD-10-CM | POA: Diagnosis not present

## 2021-11-08 DIAGNOSIS — E669 Obesity, unspecified: Secondary | ICD-10-CM | POA: Insufficient documentation

## 2021-11-08 DIAGNOSIS — I10 Essential (primary) hypertension: Secondary | ICD-10-CM | POA: Insufficient documentation

## 2021-11-08 DIAGNOSIS — E11621 Type 2 diabetes mellitus with foot ulcer: Secondary | ICD-10-CM | POA: Diagnosis not present

## 2021-11-08 DIAGNOSIS — M5412 Radiculopathy, cervical region: Secondary | ICD-10-CM

## 2021-11-08 DIAGNOSIS — E1151 Type 2 diabetes mellitus with diabetic peripheral angiopathy without gangrene: Secondary | ICD-10-CM | POA: Insufficient documentation

## 2021-11-08 NOTE — Progress Notes (Addendum)
Martin Riddle, Martin Riddle ([id number]) Visit Report for 11/08/2021 Arrival Information Details Patient Name: Martin Riddle, Martin Riddle. Date of Service: 11/08/2021 3:45 PM Medical Record Number: [id number] Patient Account Number: [id number] Date of Birth/Sex: December 31, 1970 (51 y.o. M) Treating RN: Angelina Pih Primary Care Mayo Faulk: Marletta Lor Other Clinician: Referring Xiamara Hulet: Marletta Lor Treating Louiza Moor/Extender: Rowan Blase in Treatment: 6 Visit Information History Since Last Visit Added or deleted any medications: No Patient Arrived: Ambulatory Any new allergies or adverse reactions: No Arrival Time: 15:55 Had a fall or experienced change in No Accompanied By: wife activities of daily living that may affect Transfer Assistance: None risk of falls: Patient Identification Verified: Yes Hospitalized since last visit: No Secondary Verification Process Completed: Yes Has Dressing in Place as Prescribed: Yes Patient Requires Transmission-Based No Pain Present Now: Yes Precautions: Patient Has Alerts: Yes Patient Alerts: Patient on Blood Thinner DIABETIC aspirin 81mg  Electronic Signature(s) Signed: 11/08/2021 4:40:28 PM By: 01/08/2022 Entered By: Angelina Pih on 11/08/2021 15:55:59 01/08/2022 (Martin Riddle) -------------------------------------------------------------------------------- Clinic Level of Care Assessment Details Patient Name: Martin Riddle, Martin Riddle. Date of Service: 11/08/2021 3:45 PM Medical Record Number: 01/08/2022 Patient Account Number: [id number] Date of Birth/Sex: [birth year] (51 y.o. M) Treating RN: 44 Primary Care Ameirah Khatoon: Angelina Pih Other Clinician: Referring Liani Caris: Marletta Lor Treating Latara Micheli/Extender: Marletta Lor in Treatment: 6 Clinic Level of Care Assessment Items TOOL 4 Quantity Score []  - Use when only an EandM is performed on FOLLOW-UP visit 0 ASSESSMENTS - Nursing Assessment / Reassessment X - Reassessment of Co-morbidities (includes  updates in patient status) 1 10 []  - 0 Reassessment of Adherence to Treatment Plan ASSESSMENTS - Wound and Skin Assessment / Reassessment []  - Simple Wound Assessment / Reassessment - one wound 0 X- 2 5 Complex Wound Assessment / Reassessment - multiple wounds []  - 0 Dermatologic / Skin Assessment (not related to wound area) ASSESSMENTS - Focused Assessment X - Circumferential Edema Measurements - multi extremities 1 5 []  - 0 Nutritional Assessment / Counseling / Intervention []  - 0 Lower Extremity Assessment (monofilament, tuning fork, pulses) []  - 0 Peripheral Arterial Disease Assessment (using hand held doppler) ASSESSMENTS - Ostomy and/or Continence Assessment and Care []  - Incontinence Assessment and Management 0 []  - 0 Ostomy Care Assessment and Management (repouching, etc.) PROCESS - Coordination of Care X - Simple Patient / Family Education for ongoing care 1 15 []  - 0 Complex (extensive) Patient / Family Education for ongoing care []  - 0 Staff obtains Rowan Blase, Records, Test Results / Process Orders []  - 0 Staff telephones HHA, Nursing Homes / Clarify orders / etc []  - 0 Routine Transfer to another Facility (non-emergent condition) []  - 0 Routine Hospital Admission (non-emergent condition) []  - 0 New Admissions / / Ordering NPWT, Apligraf, etc. []  - 0 Emergency Hospital Admission (emergent condition) X- 1 10 Simple Discharge Coordination []  - 0 Complex (extensive) Discharge Coordination PROCESS - Special Needs []  - Pediatric / Minor Patient Management 0 []  - 0 Isolation Patient Management []  - 0 Hearing / Language / Visual special needs []  - 0 Assessment of Community assistance (transportation, D/C planning, etc.) []  - 0 Additional assistance / Altered mentation []  - 0 Support Surface(s) Assessment (bed, cushion, seat, etc.) INTERVENTIONS - Wound Cleansing / Measurement Martin Riddle, Martin Riddle. ( ) []  - 0 Simple Wound Cleansing  - one wound X- 2 5 Complex Wound Cleansing - multiple wounds X- 1 5 Wound Imaging (photographs - any number of wounds) []  - 0 Wound Tracing (instead of  photographs)  - 0 Simple Wound Measurement - one wound X- 2 5 Complex Wound Measurement - multiple wounds INTERVENTIONS - Wound Dressings  - Small Wound Dressing one or multiple wounds 0 X- 2 15 Medium Wound Dressing one or multiple wounds  - 0 Large Wound Dressing one or multiple wounds  - 0 Application of Medications - topical  - 0 Application of Medications - injection INTERVENTIONS - Miscellaneous  - External ear exam 0  - 0 Specimen Collection (cultures, biopsies, blood, body fluids, etc.)  - 0 Specimen(s) / Culture(s) sent or taken to Lab for analysis  - 0 Patient Transfer (multiple staff / Nurse, adult / Similar devices)  - 0 Simple Staple / Suture removal (25 or less)  - 0 Complex Staple / Suture removal (26 or more)  - 0 Hypo / Hyperglycemic Management (close monitor of Blood Glucose)  - 0 Ankle / Brachial Index (ABI) - do not check if billed separately X- 1 5 Vital Signs Has the patient been seen at the hospital within the last three years: Yes Total Score: 110 Level Of Care: New/Established - Level 3 Electronic Signature(s) Signed: 11/08/2021 4:40:28 PM By: Angelina Pih Entered By: Angelina Pih on 11/08/2021 16:39:20 Martin Riddle ([id number]) -------------------------------------------------------------------------------- Encounter Discharge Information Details Patient Name: Martin Riddle, Martin Riddle. Date of Service: 11/08/2021 3:45 PM Medical Record Number: [id number] Patient Account Number: [id number] Date of Birth/Sex: [birth year] (51 y.o. M) Treating RN: Angelina Pih Primary Care Lothar Prehn: Marletta Lor Other Clinician: Referring Duanna Runk: Marletta Lor Treating Dajon Rowe/Extender: Rowan Blase in Treatment: 6 Encounter Discharge Information Items Discharge Condition:  Stable Ambulatory Status: Ambulatory Discharge Destination: Home Transportation: Private Auto Accompanied By: wife Schedule Follow-up Appointment: Yes Clinical Summary of Care: Electronic Signature(s) Signed: 11/08/2021 4:40:28 PM By: Angelina Pih Entered By: Angelina Pih on 11/08/2021 16:40:10 Martin Riddle ([id number]) -------------------------------------------------------------------------------- Lower Extremity Assessment Details Patient Name: Martin Riddle, Martin Riddle. Date of Service: 11/08/2021 3:45 PM Medical Record Number: [id number] Patient Account Number: [id number] Date of Birth/Sex: September 23, 1970 (51 y.o. M) Treating RN: Angelina Pih Primary Care Tallia Moehring: Marletta Lor Other Clinician: Referring Delonta Yohannes: Marletta Lor Treating Myrle Wanek/Extender: Digiulio Derry Weeks in Treatment: 6 Edema Assessment Assessed: [Left: No] [Right: No] Edema: [Left: Yes] [Right: Yes] Calf Left: Right: Point of Measurement: 37 cm From Medial Instep 46.5 cm 51.3 cm Ankle Left: Right: Point of Measurement: 12 cm From Medial Instep 27.4 cm 31 cm Vascular Assessment Pulses: Dorsalis Pedis Palpable: [Left:Yes] [Right:Yes] Electronic Signature(s) Signed: 11/08/2021 4:40:28 PM By: Angelina Pih Entered By: Angelina Pih on 11/08/2021 16:11:04 Martin Riddle ([id number]) -------------------------------------------------------------------------------- Multi Wound Chart Details Patient Name: Martin Riddle. Date of Service: 11/08/2021 3:45 PM Medical Record Number: [id number] Patient Account Number: [id number] Date of Birth/Sex: 09-15-1970 (51 y.o. M) Treating RN: Angelina Pih Primary Care Addison Whidbee: Marletta Lor Other Clinician: Referring Feras Gardella: Marletta Lor Treating Rashad Auld/Extender: Rowan Blase in Treatment: 6 Vital Signs Height(in): 76 Pulse(bpm): 101 Weight(lbs): 330 Blood Pressure(mmHg): 156/84 Body Mass Index(BMI): 40.2 Temperature(F): 98.1 Respiratory Rate(breaths/min):  18 Photos: [N/A:N/A] Wound Location: Right Calcaneus Left Lower Leg N/A Wounding Event: Gradually Appeared Gradually Appeared N/A Primary Etiology: Diabetic Wound/Ulcer of the Lower Diabetic Wound/Ulcer of the Lower N/A Extremity Extremity Comorbid History: Hypertension, Type II Diabetes Hypertension, Type II Diabetes N/A Date Acquired: 06/09/2021 06/09/2021 N/A Weeks of Treatment: 6 6 N/A Wound Status: Open Open N/A Wound Recurrence: No No N/A Clustered Wound: Yes No N/A Clustered Quantity: 2 N/A N/A Measurements L x W x D (cm) 1x1.3x0.1 0.1x0.1x0.1 N/A Area (cm) :  1.021 0.008 N/A Volume (cm) : 0.102 0.001 N/A % Reduction in Area: 35.00% 99.90% N/A % Reduction in Volume: 35.00% 99.90% N/A Classification: Grade 2 Grade 2 N/A Exudate Amount: Medium None Present N/A Exudate Type: Serous N/A N/A Exudate Color: amber N/A N/A Granulation Amount: Medium (34-66%) None Present (0%) N/A Granulation Quality: Pink N/A N/A Necrotic Amount: Medium (34-66%) None Present (0%) N/A Exposed Structures: Fat Layer (Subcutaneous Tissue): Fascia: No N/A Yes Fat Layer (Subcutaneous Tissue): Fascia: No No Tendon: No Tendon: No Muscle: No Muscle: No Joint: No Joint: No Bone: No Bone: No Epithelialization: Small (1-33%) Large (67-100%) N/A Treatment Notes Electronic Signature(s) Signed: 11/08/2021 4:40:28 PM By: Angelina Pih Entered By: Angelina Pih on 11/08/2021 16:11:48 Martin Riddle ([id number]) -------------------------------------------------------------------------------- Multi-Disciplinary Care Plan Details Patient Name: BRENYN, PETREY. Date of Service: 11/08/2021 3:45 PM Medical Record Number: [id number] Patient Account Number: [id number] Date of Birth/Sex: 1970-07-01 (51 y.o. M) Treating RN: Angelina Pih Primary Care Roberto Romanoski: Marletta Lor Other Clinician: Referring Masayo Fera: Marletta Lor Treating Akaash Vandewater/Extender: Rowan Blase in Treatment: 6 Active  Inactive Nutrition Nursing Diagnoses: Potential for alteratiion in Nutrition/Potential for imbalanced nutrition Goals: Patient/caregiver verbalizes understanding of need to maintain therapeutic glucose control per primary care physician Date Initiated: 09/26/2021 Target Resolution Date: 10/26/2021 Goal Status: Active Interventions: Assess HgA1c results as ordered upon admission and as needed Assess patient nutrition upon admission and as needed per policy Notes: Soft Tissue Infection Nursing Diagnoses: Impaired tissue integrity Knowledge deficit related to disease process and management Knowledge deficit related to home infection control: handwashing, handling of soiled dressings, supply storage Potential for infection: soft tissue Goals: Patient's soft tissue infection will resolve Date Initiated: 10/24/2021 Target Resolution Date: 10/31/2021 Goal Status: Active Interventions: Assess signs and symptoms of infection every visit Provide education on infection Treatment Activities: Education provided on Infection : 10/24/2021 Notes: Wound/Skin Impairment Nursing Diagnoses: Knowledge deficit related to ulceration/compromised skin integrity Goals: Patient/caregiver will verbalize understanding of skin care regimen Date Initiated: 09/26/2021 Target Resolution Date: 10/26/2021 Goal Status: Active Ulcer/skin breakdown will have a volume reduction of 30% by week 4 Date Initiated: 09/26/2021 Target Resolution Date: 10/26/2021 Goal Status: Active Ulcer/skin breakdown will have a volume reduction of 50% by week 8 Date Initiated: 09/26/2021 Target Resolution Date: 11/26/2021 Goal Status: Active Ulcer/skin breakdown will have a volume reduction of 80% by week 12 Martin Riddle, Martin Riddle ([id number]) Date Initiated: 09/26/2021 Target Resolution Date: 12/26/2021 Goal Status: Active Ulcer/skin breakdown will heal within 14 weeks Date Initiated: 09/26/2021 Target Resolution Date: 01/26/2022 Goal Status:  Active Interventions: Assess patient/caregiver ability to obtain necessary supplies Assess patient/caregiver ability to perform ulcer/skin care regimen upon admission and as needed Assess ulceration(s) every visit Notes: Electronic Signature(s) Signed: 11/08/2021 4:40:28 PM By: Angelina Pih Entered By: Angelina Pih on 11/08/2021 16:11:41 Martin Riddle ([id number]) -------------------------------------------------------------------------------- Non-Wound Condition Assessment Details Patient Name: Martin Riddle. Date of Service: 11/08/2021 3:45 PM Medical Record Number: [id number] Patient Account Number: [id number] Date of Birth/Sex: [birth year] (51 y.o. M) Treating RN: Angelina Pih Primary Care Alfredo Collymore: Marletta Lor Other Clinician: Referring Damontae Loppnow: Marletta Lor Treating Carlotta Telfair/Extender: Bostelman Derry Weeks in Treatment: 6 Non-Wound Condition: Condition: Lymphedema Location: Leg Side: Right Photos Electronic Signature(s) Signed: 11/08/2021 4:40:28 PM By: Angelina Pih Entered By: Angelina Pih on 11/08/2021 16:11:30 Martin Riddle, Martin Riddle ([id number]) -------------------------------------------------------------------------------- Pain Assessment Details Patient Name: Martin Riddle, Martin Riddle. Date of Service: 11/08/2021 3:45 PM Medical Record Number: [id number] Patient Account Number: [id number] Date of Birth/Sex: [birth year] (51 y.o. M) Treating RN: Angelina Pih Primary Care Kenzy Campoverde:  Marletta Lor Other Clinician: Referring Briant Angelillo: Marletta Lor Treating Zyan Mirkin/Extender: Rowan Blase in Treatment: 6 Active Problems Location of Pain Severity and Description of Pain Patient Has Paino Yes Site Locations Rate the pain. Current Pain Level: 6 Pain Management and Medication Current Pain Management: Notes pt states pain in bilat lower legs Electronic Signature(s) Signed: 11/08/2021 4:40:28 PM By: Angelina Pih Entered By: Angelina Pih on 11/08/2021 15:56:30 Martin Riddle  ([id number]) -------------------------------------------------------------------------------- Patient/Caregiver Education Details Patient Name: Martin Riddle, Martin Riddle. Date of Service: 11/08/2021 3:45 PM Medical Record Number: [id number] Patient Account Number: [id number] Date of Birth/Gender: February 04, 1971 (51 y.o. M) Treating RN: Angelina Pih Primary Care Physician: Marletta Lor Other Clinician: Referring Physician: Marletta Lor Treating Physician/Extender: Rowan Blase in Treatment: 6 Education Assessment Education Provided To: Patient and Caregiver Education Topics Provided Wound/Skin Impairment: Handouts: Caring for Your Ulcer Methods: Explain/Verbal Responses: State content correctly Electronic Signature(s) Signed: 11/08/2021 4:40:28 PM By: Angelina Pih Entered By: Angelina Pih on 11/08/2021 16:39:40 CAELUM, FEDERICI ([id number]) -------------------------------------------------------------------------------- Wound Assessment Details Patient Name: TAKERU, BOSE. Date of Service: 11/08/2021 3:45 PM Medical Record Number: [id number] Patient Account Number: [id number] Date of Birth/Sex: [birth year] (51 y.o. M) Treating RN: Angelina Pih Primary Care Turrell Severt: Marletta Lor Other Clinician: Referring Majestic Brister: Marletta Lor Treating Bryson Palen/Extender: Klawitter Derry Weeks in Treatment: 6 Wound Status Wound Number: 1 Primary Etiology: Diabetic Wound/Ulcer of the Lower Extremity Wound Location: Right Calcaneus Wound Status: Open Wounding Event: Gradually Appeared Comorbid History: Hypertension, Type II Diabetes Date Acquired: 06/09/2021 Weeks Of Treatment: 6 Clustered Wound: Yes Photos Wound Measurements Length: (cm) 1 Width: (cm) 1.3 Depth: (cm) 0.1 Clustered Quantity: 2 Area: (cm) 1.021 Volume: (cm) 0.102 % Reduction in Area: 35% % Reduction in Volume: 35% Epithelialization: Small (1-33%) Tunneling: No Undermining: No Wound Description Classification: Grade 2 Exudate  Amount: Medium Exudate Type: Serous Exudate Color: amber Foul Odor After Cleansing: No Slough/Fibrino Yes Wound Bed Granulation Amount: Medium (34-66%) Exposed Structure Granulation Quality: Pink Fascia Exposed: No Necrotic Amount: Medium (34-66%) Fat Layer (Subcutaneous Tissue) Exposed: Yes Necrotic Quality: Adherent Slough Tendon Exposed: No Muscle Exposed: No Joint Exposed: No Bone Exposed: No Treatment Notes Wound #1 (Calcaneus) Wound Laterality: Right Cleanser Soap and Water Discharge Instruction: Gently cleanse wound with antibacterial soap, rinse and pat dry prior to dressing wounds Peri-Wound Care FILBERT, CRAZE ([id number]) Topical Primary Dressing Silvercel 4 1/4x 4 1/4 (in/in) Discharge Instruction: Apply Silvercel 4 1/4x 4 1/4 (in/in) as instructed Secondary Dressing Kerlix 4.5 x 4.1 (in/yd) Discharge Instruction: Apply Kerlix 4.5 x 4.1 (in/yd) as instructed Secured With Medipore Tape - 16M Medipore H Soft Cloth Surgical Tape, 2x2 (in/yd) Tubigrip Size E, 3.5x10 (in/yds) Discharge Instruction: Apply 3 Tubigrip E 3-finger-widths below knee to base of toes to secure dressing and/or for swelling. Compression Wrap Compression Stockings Add-Ons Electronic Signature(s) Signed: 11/08/2021 4:40:28 PM By: Angelina Pih Entered By: Angelina Pih on 11/08/2021 16:09:46 WELLS, MABE ([id number]) -------------------------------------------------------------------------------- Wound Assessment Details Patient Name: NICKY, KRAS. Date of Service: 11/08/2021 3:45 PM Medical Record Number: [id number] Patient Account Number: [id number] Date of Birth/Sex: 1970/09/09 (51 y.o. M) Treating RN: Angelina Pih Primary Care Crew Goren: Marletta Lor Other Clinician: Referring Dannae Kato: Marletta Lor Treating Raydin Bielinski/Extender: Freel Derry Weeks in Treatment: 6 Wound Status Wound Number: 2 Primary Etiology: Diabetic Wound/Ulcer of the Lower Extremity Wound Location: Left Lower  Leg Wound Status: Open Wounding Event: Gradually Appeared Comorbid History: Hypertension, Type II Diabetes Date Acquired: 06/09/2021 Weeks Of Treatment: 6 Clustered Wound: No Photos Wound Measurements Length: (cm) 0.1  Width: (cm) 0.1 Depth: (cm) 0.1 Area: (cm) 0.008 Volume: (cm) 0.001 % Reduction in Area: 99.9% % Reduction in Volume: 99.9% Epithelialization: Large (67-100%) Tunneling: No Undermining: No Wound Description Classification: Grade 2 Exudate Amount: None Present Foul Odor After Cleansing: No Slough/Fibrino Yes Wound Bed Granulation Amount: None Present (0%) Exposed Structure Necrotic Amount: None Present (0%) Fascia Exposed: No Fat Layer (Subcutaneous Tissue) Exposed: No Tendon Exposed: No Muscle Exposed: No Joint Exposed: No Bone Exposed: No Treatment Notes Wound #2 (Lower Leg) Wound Laterality: Left Cleanser Soap and Water Discharge Instruction: Gently cleanse wound with antibacterial soap, rinse and pat dry prior to dressing wounds Peri-Wound Care Topical Martin HauserLLEN, Ryder C. ([id number]) Primary Dressing Silvercel 4 1/4x 4 1/4 (in/in) Discharge Instruction: Apply Silvercel 4 1/4x 4 1/4 (in/in) as instructed Secondary Dressing Kerlix 4.5 x 4.1 (in/yd) Discharge Instruction: Apply Kerlix 4.5 x 4.1 (in/yd) as instructed Secured With Medipore Tape - 95M Medipore H Soft Cloth Surgical Tape, 2x2 (in/yd) Tubigrip Size E, 3.5x10 (in/yds) Discharge Instruction: Apply 3 Tubigrip E 3-finger-widths below knee to base of toes to secure dressing and/or for swelling. Compression Wrap Compression Stockings Add-Ons Electronic Signature(s) Signed: 11/08/2021 4:40:28 PM By: Angelina PihGordon, Caitlin Entered By: Angelina PihGordon, Caitlin on 11/08/2021 16:10:16 Martin HauserLLEN, Zeppelin C. ([id number]) -------------------------------------------------------------------------------- Vitals Details Patient Name: Martin HauserLLEN, Huzaifa C. Date of Service: 11/08/2021 3:45 PM Medical Record Number: [id number] Patient  Account Number: [id number] Date of Birth/Sex: 02/07/1971 (51 y.o. M) Treating RN: Angelina PihGordon, Caitlin Primary Care Fred Hammes: Marletta LorBarr, Julie Other Clinician: Referring Sohum Delillo: Marletta LorBarr, Julie Treating Donnika Kucher/Extender: Rowan BlaseStone, Hoyt Weeks in Treatment: 6 Vital Signs Time Taken: 15:56 Temperature (F): 98.1 Height (in): 76 Pulse (bpm): 101 Weight (lbs): 330 Respiratory Rate (breaths/min): 18 Body Mass Index (BMI): 40.2 Blood Pressure (mmHg): 156/84 Reference Range: 80 - 120 mg / dl Electronic Signature(s) Signed: 11/08/2021 4:40:28 PM By: Angelina PihGordon, Caitlin Entered By: Angelina PihGordon, Caitlin on 11/08/2021 15:56:17

## 2021-11-08 NOTE — Progress Notes (Addendum)
YOBANY, VROOM ([id number]) Visit Report for 11/08/2021 Chief Complaint Document Details Patient Name: Martin Riddle, Martin Riddle. Date of Service: 11/08/2021 3:45 PM Medical Record Number: [id number] Patient Account Number: [id number] Date of Birth/Sex: September 02, 1970 (51 y.o. M) Treating RN: Huel Coventry Primary Care Provider: Marletta Lor Other Clinician: Referring Provider: Marletta Lor Treating Provider/Extender: Rowan Blase in Treatment: 6 Information Obtained from: Patient Chief Complaint Bilateral foot ulcers and left leg ulcer Electronic Signature(s) Signed: 11/08/2021 3:50:58 PM By: Lenda Kelp PA-C Entered By: Lenda Kelp on 11/08/2021 15:50:57 LONNEL, GJERDE ([id number]) -------------------------------------------------------------------------------- HPI Details Patient Name: Martin Riddle Date of Service: 11/08/2021 3:45 PM Medical Record Number: [id number] Patient Account Number: [id number] Date of Birth/Sex: 14-Nov-1970 (51 y.o. M) Treating RN: Huel Coventry Primary Care Provider: Marletta Lor Other Clinician: Referring Provider: Marletta Lor Treating Provider/Extender: Rowan Blase in Treatment: 6 History of Present Illness HPI Description: 09-26-2021 upon evaluation today patient presents today for evaluation of wounds on the right heel, left lower leg, and left fifth metatarsal head location more plantar. With that being said these wounds have been present for since around June 09, 2021. Currently the patient has had dressings on that were unsure exactly how long they have been in place he has legs that appear to be extremely dry and he had a lot of dead tissue and dry skin buildup. It does make me concerned about the fact that he has not been changing these dressings appropriately nor anywhere close to regularly. Currently with regard to the wounds he does seem to be doing decently well all things considered. That is at least good news. I see no signs of obvious active infection  at the moment. The patient's most recent ABI was on 05/31/2021 and was on the right 1.15 and left 1.02 there was no arterial study performed for completeness nor a TBI. This may be something to consider going forward. Patient has otherwise a history of diabetes mellitus type 2, chronic venous insufficiency, lymphedema, and hypertension. 10-17-2021 upon evaluation today patient actually appears to be doing well today with regard to his leg on the left I am very pleased both with the foot as well as the leg I think we are on the right track here. As far as the wound on the right leg this is more circumferential and to be honest it actually has blue-green drainage consistent with Pseudomonas and appears to be significantly worse compared to last week's evaluation. I do believe that this is going to likely require some antibiotics I Minna send this into the pharmacy for him. 10-24-2021 upon evaluation today patient appears to be doing a little bit better in regard to his leg with regard to the color it does not appear to be quite as red as before he still has a tremendous amount of weeping. I do believe compression therapy would be beneficial for him but we are still waiting on the vascular study which should be upcoming. He tells me the 27th is the date that he has been given. Fortunately I do not see any signs of systemic infection right now. 10-31-2021 upon evaluation today patient's wounds in general are showing signs of improvement. Fortunately there does not appear to be any evidence of active infection locally or systemically which is great news the left lateral lower extremity he has significantly improved. The right heel is doing better although there is some need for debridement at both locations. 11-08-2021 upon evaluation today patient appears to be doing well  with regard to his legs. He has been tolerating the dressing changes without complication. Fortunately there does not appear to be any  evidence of active infection locally or systemically at this time which is great news. No fevers, chills, nausea, vomiting, or diarrhea. I do feel like he is making excellent progress. Electronic Signature(s) Signed: 11/08/2021 4:32:09 PM By: Lenda Kelp PA-C Entered By: Lenda Kelp on 11/08/2021 16:32:09 TAEVON, ASCHOFF ([id number]) -------------------------------------------------------------------------------- Physical Exam Details Patient Name: Martin Riddle, Martin Riddle. Date of Service: 11/08/2021 3:45 PM Medical Record Number: [id number] Patient Account Number: [id number] Date of Birth/Sex: Feb 17, 1971 (51 y.o. M) Treating RN: Angelina Pih Primary Care Provider: Marletta Lor Other Clinician: Referring Provider: Marletta Lor Treating Provider/Extender: Naron Derry Weeks in Treatment: 6 Constitutional Obese and well-hydrated in no acute distress. Respiratory normal breathing without difficulty. Psychiatric this patient is able to make decisions and demonstrates good insight into disease process. Alert and Oriented x 3. pleasant and cooperative. Notes Upon inspection today patient's wound bed actually showed signs of improvement on both legs the left leg is dried up quite nicely the right leg is better. Overall I think you are on the right track. Electronic Signature(s) Signed: 11/08/2021 4:32:29 PM By: Lenda Kelp PA-C Entered By: Lenda Kelp on 11/08/2021 16:32:29 JAHSON, EMANUELE ([id number]) -------------------------------------------------------------------------------- Physician Orders Details Patient Name: ZAVIYAR, RAHAL. Date of Service: 11/08/2021 3:45 PM Medical Record Number: [id number] Patient Account Number: [id number] Date of Birth/Sex: [birth year] (51 y.o. M) Treating RN: Angelina Pih Primary Care Provider: Marletta Lor Other Clinician: Referring Provider: Marletta Lor Treating Provider/Extender: Rowan Blase in Treatment: 6 Verbal / Phone Orders: No Diagnosis  Coding ICD-10 Coding Code Description E11.621 Type 2 diabetes mellitus with foot ulcer L97.522 Non-pressure chronic ulcer of other part of left foot with fat layer exposed L97.412 Non-pressure chronic ulcer of right heel and midfoot with fat layer exposed I87.2 Venous insufficiency (chronic) (peripheral) I89.0 Lymphedema, not elsewhere classified L97.822 Non-pressure chronic ulcer of other part of left lower leg with fat layer exposed I10 Essential (primary) hypertension Follow-up Appointments o Return Appointment in 1 week. o Nurse Visit as needed o Other: - Call the number to schedule Vascular appt Bathing/ Shower/ Hygiene o May shower; gently cleanse wound with antibacterial soap, rinse and pat dry prior to dressing wounds o No tub bath. Anesthetic (Use 'Patient Medications' Section for Anesthetic Order Entry) o Lidocaine applied to wound bed Edema Control - Lymphedema / Segmental Compressive Device / Other o Elevate, Exercise Daily and Avoid Standing for Long Periods of Time. o Elevate legs to the level of the heart and pump ankles as often as possible o Elevate leg(s) parallel to the floor when sitting. o DO YOUR BEST to sleep in the bed at night. DO NOT sleep in your recliner. Long hours of sitting in a recliner leads to swelling of the legs and/or potential wounds on your backside. Additional Orders / Instructions o Follow Nutritious Diet and Increase Protein Intake - monitor blood sugar Medications-Please add to medication list. o P.O. Antibiotics - Continue Levaquin Wound Treatment Wound #1 - Calcaneus Wound Laterality: Right Cleanser: Soap and Water 3 x Per Week/30 Days Discharge Instructions: Gently cleanse wound with antibacterial soap, rinse and pat dry prior to dressing wounds Primary Dressing: Silvercel 4 1/4x 4 1/4 (in/in) 3 x Per Week/30 Days Discharge Instructions: Apply Silvercel 4 1/4x 4 1/4 (in/in) as instructed Secondary Dressing:  Kerlix 4.5 x 4.1 (in/yd) 3 x Per Week/30 Days Discharge Instructions: Apply  Kerlix 4.5 x 4.1 (in/yd) as instructed Secured With: Medipore Tape - 42M Medipore H Soft Cloth Surgical Tape, 2x2 (in/yd) 3 x Per Week/30 Days Secured With: Tubigrip Size E, 3.5x10 (in/yds) 3 x Per Week/30 Days Discharge Instructions: Apply 3 Tubigrip E 3-finger-widths below knee to base of toes to secure dressing and/or for swelling. Wound #2 - Lower Leg Wound Laterality: Left Cleanser: Soap and Water 3 x Per Week/30 Days ZYKEE, AVAKIAN ([id number]) Discharge Instructions: Gently cleanse wound with antibacterial soap, rinse and pat dry prior to dressing wounds Primary Dressing: Silvercel 4 1/4x 4 1/4 (in/in) 3 x Per Week/30 Days Discharge Instructions: Apply Silvercel 4 1/4x 4 1/4 (in/in) as instructed Secondary Dressing: Kerlix 4.5 x 4.1 (in/yd) 3 x Per Week/30 Days Discharge Instructions: Apply Kerlix 4.5 x 4.1 (in/yd) as instructed Secured With: Medipore Tape - 42M Medipore H Soft Cloth Surgical Tape, 2x2 (in/yd) 3 x Per Week/30 Days Secured With: Tubigrip Size E, 3.5x10 (in/yds) 3 x Per Week/30 Days Discharge Instructions: Apply 3 Tubigrip E 3-finger-widths below knee to base of toes to secure dressing and/or for swelling. Electronic Signature(s) Signed: 11/08/2021 4:40:28 PM By: Angelina Pih Signed: 11/11/2021 4:20:03 PM By: Lenda Kelp PA-C Entered By: Angelina Pih on 11/08/2021 16:38:40 LARANCE, RATLEDGE ([id number]) -------------------------------------------------------------------------------- Problem List Details Patient Name: Martin Riddle, Martin Riddle. Date of Service: 11/08/2021 3:45 PM Medical Record Number: [id number] Patient Account Number: [id number] Date of Birth/Sex: 08-09-1970 (51 y.o. M) Treating RN: Huel Coventry Primary Care Provider: Marletta Lor Other Clinician: Referring Provider: Marletta Lor Treating Provider/Extender: Rowan Blase in Treatment: 6 Active Problems ICD-10 Encounter Code  Description Active Date MDM Diagnosis E11.621 Type 2 diabetes mellitus with foot ulcer 09/26/2021 No Yes L97.522 Non-pressure chronic ulcer of other part of left foot with fat layer 09/26/2021 No Yes exposed L97.412 Non-pressure chronic ulcer of right heel and midfoot with fat layer 09/26/2021 No Yes exposed I87.2 Venous insufficiency (chronic) (peripheral) 09/26/2021 No Yes I89.0 Lymphedema, not elsewhere classified 09/26/2021 No Yes L97.822 Non-pressure chronic ulcer of other part of left lower leg with fat layer 09/26/2021 No Yes exposed I10 Essential (primary) hypertension 09/26/2021 No Yes Inactive Problems Resolved Problems Electronic Signature(s) Signed: 11/08/2021 3:50:55 PM By: Lenda Kelp PA-C Entered By: Lenda Kelp on 11/08/2021 15:50:54 Martin Riddle ([id number]) -------------------------------------------------------------------------------- Progress Note Details Patient Name: Martin Riddle. Date of Service: 11/08/2021 3:45 PM Medical Record Number: [id number] Patient Account Number: [id number] Date of Birth/Sex: 01/20/1971 (51 y.o. M) Treating RN: Angelina Pih Primary Care Provider: Marletta Lor Other Clinician: Referring Provider: Marletta Lor Treating Provider/Extender: Rowan Blase in Treatment: 6 Subjective Chief Complaint Information obtained from Patient Bilateral foot ulcers and left leg ulcer History of Present Illness (HPI) 09-26-2021 upon evaluation today patient presents today for evaluation of wounds on the right heel, left lower leg, and left fifth metatarsal head location more plantar. With that being said these wounds have been present for since around June 09, 2021. Currently the patient has had dressings on that were unsure exactly how long they have been in place he has legs that appear to be extremely dry and he had a lot of dead tissue and dry skin buildup. It does make me concerned about the fact that he has not been changing these dressings  appropriately nor anywhere close to regularly. Currently with regard to the wounds he does seem to be doing decently well all things considered. That is at least good news. I see no signs of obvious active  infection at the moment. The patient's most recent ABI was on 05/31/2021 and was on the right 1.15 and left 1.02 there was no arterial study performed for completeness nor a TBI. This may be something to consider going forward. Patient has otherwise a history of diabetes mellitus type 2, chronic venous insufficiency, lymphedema, and hypertension. 10-17-2021 upon evaluation today patient actually appears to be doing well today with regard to his leg on the left I am very pleased both with the foot as well as the leg I think we are on the right track here. As far as the wound on the right leg this is more circumferential and to be honest it actually has blue-green drainage consistent with Pseudomonas and appears to be significantly worse compared to last week's evaluation. I do believe that this is going to likely require some antibiotics I Minna send this into the pharmacy for him. 10-24-2021 upon evaluation today patient appears to be doing a little bit better in regard to his leg with regard to the color it does not appear to be quite as red as before he still has a tremendous amount of weeping. I do believe compression therapy would be beneficial for him but we are still waiting on the vascular study which should be upcoming. He tells me the 27th is the date that he has been given. Fortunately I do not see any signs of systemic infection right now. 10-31-2021 upon evaluation today patient's wounds in general are showing signs of improvement. Fortunately there does not appear to be any evidence of active infection locally or systemically which is great news the left lateral lower extremity he has significantly improved. The right heel is doing better although there is some need for debridement at both  locations. 11-08-2021 upon evaluation today patient appears to be doing well with regard to his legs. He has been tolerating the dressing changes without complication. Fortunately there does not appear to be any evidence of active infection locally or systemically at this time which is great news. No fevers, chills, nausea, vomiting, or diarrhea. I do feel like he is making excellent progress. Objective Constitutional Obese and well-hydrated in no acute distress. Vitals Time Taken: 3:56 PM, Height: 76 in, Weight: 330 lbs, BMI: 40.2, Temperature: 98.1 F, Pulse: 101 bpm, Respiratory Rate: 18 breaths/min, Blood Pressure: 156/84 mmHg. Respiratory normal breathing without difficulty. Psychiatric this patient is able to make decisions and demonstrates good insight into disease process. Alert and Oriented x 3. pleasant and cooperative. General Notes: Upon inspection today patient's wound bed actually showed signs of improvement on both legs the left leg is dried up quite nicely the right leg is better. Overall I think you are on the right track. Integumentary (Hair, Skin) Wound #1 status is Open. Original cause of wound was Gradually Appeared. The date acquired was: 06/09/2021. The wound has been in treatment 6 weeks. The wound is located on the Right Calcaneus. The wound measures 1cm length x 1.3cm width x 0.1cm depth; 1.021cm^2 area and Dorothey BasemanLLEN, Damontre C. ([id number]) 0.102cm^3 volume. There is Fat Layer (Subcutaneous Tissue) exposed. There is no tunneling or undermining noted. There is a medium amount of serous drainage noted. There is medium (34-66%) pink granulation within the wound bed. There is a medium (34-66%) amount of necrotic tissue within the wound bed including Adherent Slough. Wound #2 status is Open. Original cause of wound was Gradually Appeared. The date acquired was: 06/09/2021. The wound has been in treatment 6 weeks. The wound is  located on the Left Lower Leg. The wound measures 0.1cm  length x 0.1cm width x 0.1cm depth; 0.008cm^2 area and 0.001cm^3 volume. There is no tunneling or undermining noted. There is a none present amount of drainage noted. There is no granulation within the wound bed. There is no necrotic tissue within the wound bed. Other Condition(s) Patient presents with Lymphedema located on the Right Leg. 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 I am going to recommend that we go and continue with the wound care measures as before and the patient is in agreement with the plan. This includes the use of the silver alginate dressing which I do believe is doing quite well. 2. Also, recommend that we have the patient continue to monitor for any signs of worsening or infection. I will proceed right now I think that we are doing quite well. It has been rescheduled already and he definitely needs to have this. I think he needs stronger compression I cannot do that until he has the appointment. 3. The patient was supposed to have his vascular appointment on the 23rd apparently that did not happen. I do believe that he needs to contact vascular and get this rescheduled I do not see where We will see patient back for reevaluation in 1 week here in the clinic. If anything worsens or changes patient will contact our office for additional recommendations. Electronic Signature(s) Signed: 11/08/2021 4:33:39 PM By: Lenda Kelp PA-C Entered By: Lenda Kelp on 11/08/2021 16:33:39 BRIXTON, FRANKO  ([id number]) -------------------------------------------------------------------------------- SuperBill Details Patient Name: Martin Riddle, Martin Riddle. Date of Service: 11/08/2021 Medical Record Number: [id number] Patient Account Number: [id number] Date of Birth/Sex: 02-Sep-1970 (51 y.o. M) Treating RN: Angelina Pih Primary Care Provider: Marletta Lor Other Clinician: Referring Provider: Marletta Lor Treating Provider/Extender: Skilling Derry Weeks in Treatment: 6 Diagnosis Coding ICD-10 Codes Code Description [phone] Type 2 diabetes mellitus with foot ulcer L97.522 Non-pressure chronic ulcer of other part of left foot with fat layer exposed L97.412 Non-pressure chronic ulcer of right heel and midfoot with fat layer exposed I87.2 Venous insufficiency (chronic) (peripheral) I89.0 Lymphedema, not elsewhere classified L97.822 Non-pressure chronic ulcer of other part of left lower leg with fat layer exposed I10 Essential (primary) hypertension Facility Procedures CPT4 Code: [id number] Description: 99213 - WOUND CARE VISIT-LEV 3 EST PT Modifier: Quantity: 1 Physician Procedures CPT4 Code: [id number] Description: 99214 - WC PHYS LEVEL 4 - EST PT Modifier: Quantity: 1 CPT4 Code: Description: ICD-10 Diagnosis Description E11.621 Type 2 diabetes mellitus with foot ulcer L97.522 Non-pressure chronic ulcer of other part of left foot with fat layer e L97.412 Non-pressure chronic ulcer of right heel and midfoot with fat layer ex I87.2  Venous insufficiency (chronic) (peripheral) Modifier: xposed posed Quantity: Electronic Signature(s) Signed: 11/08/2021 4:40:28 PM By: Angelina Pih Signed: 11/11/2021 4:20:03 PM By: Lenda Kelp PA-C Previous Signature: 11/08/2021 4:34:56 PM Version By: Lenda Kelp PA-C Entered By: Angelina Pih on 11/08/2021 16:39:30

## 2021-11-14 ENCOUNTER — Ambulatory Visit: Payer: Medicare Other | Admitting: Physician Assistant

## 2021-11-14 ENCOUNTER — Telehealth (INDEPENDENT_AMBULATORY_CARE_PROVIDER_SITE_OTHER): Payer: Self-pay | Admitting: Nurse Practitioner

## 2021-11-14 NOTE — Telephone Encounter (Signed)
Patient will be calling back to the office to try and schedule an appointment. Patient is not to be scheduled until a new referral is sent in, as he has no showed all prior appointments

## 2021-11-21 ENCOUNTER — Encounter: Payer: Medicare Other | Admitting: Physician Assistant

## 2021-11-28 ENCOUNTER — Encounter: Payer: Medicare Other | Admitting: Physician Assistant

## 2021-12-02 ENCOUNTER — Ambulatory Visit: Payer: Medicare Other | Admitting: Physician Assistant

## 2021-12-05 ENCOUNTER — Encounter: Payer: Medicare Other | Admitting: Physician Assistant

## 2021-12-05 DIAGNOSIS — E11621 Type 2 diabetes mellitus with foot ulcer: Secondary | ICD-10-CM | POA: Diagnosis not present

## 2021-12-05 NOTE — Progress Notes (Addendum)
BRAEDON, SJOGREN ([id number]) Visit Report for 12/05/2021 Chief Complaint Document Details Patient Name: Martin Riddle, Martin Riddle. Date of Service: 12/05/2021 9:45 AM Medical Record Number: [id number] Patient Account Number: [id number] Date of Birth/Sex: 06/26/1970 (51 y.o. M) Treating RN: Huel Coventry Primary Care Provider: Marletta Lor Other Clinician: Referring Provider: Marletta Lor Treating Provider/Extender: Rowan Blase in Treatment: 10 Information Obtained from: Patient Chief Complaint Bilateral foot ulcers and left leg ulcer Electronic Signature(s) Signed: 12/05/2021 10:02:45 AM By: Lenda Kelp PA-C Entered By: Lenda Kelp on 12/05/2021 10:02:45 ELIS, SAUBER ([id number]) -------------------------------------------------------------------------------- HPI Details Patient Name: Martin Riddle, Martin Riddle. Date of Service: 12/05/2021 9:45 AM Medical Record Number: [id number] Patient Account Number: [id number] Date of Birth/Sex: Jul 18, 1970 (51 y.o. M) Treating RN: Huel Coventry Primary Care Provider: Marletta Lor Other Clinician: Referring Provider: Marletta Lor Treating Provider/Extender: Rowan Blase in Treatment: 10 History of Present Illness HPI Description: 09-26-2021 upon evaluation today patient presents today for evaluation of wounds on the right heel, left lower leg, and left fifth metatarsal head location more plantar. With that being said these wounds have been present for since around June 09, 2021. Currently the patient has had dressings on that were unsure exactly how long they have been in place he has legs that appear to be extremely dry and he had a lot of dead tissue and dry skin buildup. It does make me concerned about the fact that he has not been changing these dressings appropriately nor anywhere close to regularly. Currently with regard to the wounds he does seem to be doing decently well all things considered. That is at least good news. I see no signs of obvious active  infection at the moment. The patient's most recent ABI was on 05/31/2021 and was on the right 1.15 and left 1.02 there was no arterial study performed for completeness nor a TBI. This may be something to consider going forward. Patient has otherwise a history of diabetes mellitus type 2, chronic venous insufficiency, lymphedema, and hypertension. 10-17-2021 upon evaluation today patient actually appears to be doing well today with regard to his leg on the left I am very pleased both with the foot as well as the leg I think we are on the right track here. As far as the wound on the right leg this is more circumferential and to be honest it actually has blue-green drainage consistent with Pseudomonas and appears to be significantly worse compared to last week's evaluation. I do believe that this is going to likely require some antibiotics I Minna send this into the pharmacy for him. 10-24-2021 upon evaluation today patient appears to be doing a little bit better in regard to his leg with regard to the color it does not appear to be quite as red as before he still has a tremendous amount of weeping. I do believe compression therapy would be beneficial for him but we are still waiting on the vascular study which should be upcoming. He tells me the 27th is the date that he has been given. Fortunately I do not see any signs of systemic infection right now. 10-31-2021 upon evaluation today patient's wounds in general are showing signs of improvement. Fortunately there does not appear to be any evidence of active infection locally or systemically which is great news the left lateral lower extremity he has significantly improved. The right heel is doing better although there is some need for debridement at both locations. 11-08-2021 upon evaluation today patient appears to be doing well  with regard to his legs. He has been tolerating the dressing changes without complication. Fortunately there does not appear to be  any evidence of active infection locally or systemically at this time which is great news. No fevers, chills, nausea, vomiting, or diarrhea. I do feel like he is making excellent progress.Marland Kitchen. 12-05-2021 upon evaluation today patient appears to be doing well with regard to his wound. In fact everything appears to be completely healed which is great news. Fortunately I do not see any evidence of active infection locally or systemically at this time. No fevers, chills, nausea, vomiting, or diarrhea. Electronic Signature(s) Signed: 12/05/2021 11:06:30 AM By: Lenda KelpStone III, Yarithza Mink PA-C Entered By: Lenda KelpStone III, Lorene Samaan on 12/05/2021 11:06:29 Martin Riddle, Martin C. ([id number]) -------------------------------------------------------------------------------- Physical Exam Details Patient Name: Martin Riddle, Martin C. Date of Service: 12/05/2021 9:45 AM Medical Record Number: [id number] Patient Account Number: [id number] Date of Birth/Sex: 03/23/1971 (51 y.o. M) Treating RN: Huel CoventryWoody, Kim Primary Care Provider: Marletta LorBarr, Julie Other Clinician: Referring Provider: Marletta LorBarr, Julie Treating Provider/Extender: Maddux DerryStone, Mechille Varghese Weeks in Treatment: 10 Constitutional Obese and well-hydrated in no acute distress. Respiratory normal breathing without difficulty. Psychiatric this patient is able to make decisions and demonstrates good insight into disease process. Alert and Oriented x 3. pleasant and cooperative. Notes Upon inspection patient's wound bed showed signs of excellent epithelization I feel like he is done well his swelling is tremendously down and he is very appreciative of how things appear today. Electronic Signature(s) Signed: 12/05/2021 11:06:42 AM By: Lenda KelpStone III, Muskan Bolla PA-C Entered By: Lenda KelpStone III, Syd Manges on 12/05/2021 11:06:42 Martin Riddle, Martin C. ([id number]) -------------------------------------------------------------------------------- Physician Orders Details Patient Name: Martin Riddle, Martin C. Date of Service: 12/05/2021 9:45 AM Medical Record  Number: [id number] Patient Account Number: [id number] Date of Birth/Sex: 11/09/1970 (51 y.o. M) Treating RN: Laurina Bustlerisp, Clova Primary Care Provider: Marletta LorBarr, Julie Other Clinician: Referring Provider: Marletta LorBarr, Julie Treating Provider/Extender: Rowan BlaseStone, Ciani Rutten Weeks in Treatment: 10 Verbal / Phone Orders: No Diagnosis Coding ICD-10 Coding Code Description E11.621 Type 2 diabetes mellitus with foot ulcer L97.522 Non-pressure chronic ulcer of other part of left foot with fat layer exposed L97.412 Non-pressure chronic ulcer of right heel and midfoot with fat layer exposed I87.2 Venous insufficiency (chronic) (peripheral) I89.0 Lymphedema, not elsewhere classified L97.822 Non-pressure chronic ulcer of other part of left lower leg with fat layer exposed I10 Essential (primary) hypertension Edema Control - Lymphedema / Segmental Compressive Device / Other o Elevate, Exercise Daily and Avoid Standing for Long Periods of Time. o Elevate legs to the level of the heart and pump ankles as often as possible o Elevate leg(s) parallel to the floor when sitting. o DO YOUR BEST to sleep in the bed at night. DO NOT sleep in your recliner. Long hours of sitting in a recliner leads to swelling of the legs and/or potential wounds on your backside. Additional Orders / Instructions o Follow Nutritious Diet and Increase Protein Intake - monitor blood sugar Electronic Signature(s) Signed: 12/06/2021 4:01:56 PM By: Laurina Bustlerisp, Clova Signed: 12/06/2021 4:59:17 PM By: Lenda KelpStone III, Griselda Tosh PA-C Entered By: Laurina Bustlerisp, Clova on 12/05/2021 10:46:44 Martin Riddle, Martin C. ([id number]) -------------------------------------------------------------------------------- Problem List Details Patient Name: Martin Riddle, Yehya C. Date of Service: 12/05/2021 9:45 AM Medical Record Number: [id number] Patient Account Number: [id number] Date of Birth/Sex: 06/27/1970 (51 y.o. M) Treating RN: Huel CoventryWoody, Kim Primary Care Provider: Marletta LorBarr, Julie Other Clinician: Referring  Provider: Marletta LorBarr, Julie Treating Provider/Extender: Rowan BlaseStone, Corinda Ammon Weeks in Treatment: 10 Active Problems ICD-10 Encounter Code Description Active Date MDM Diagnosis E11.621 Type 2 diabetes mellitus with foot ulcer  09/26/2021 No Yes L97.522 Non-pressure chronic ulcer of other part of left foot with fat layer 09/26/2021 No Yes exposed L97.412 Non-pressure chronic ulcer of right heel and midfoot with fat layer 09/26/2021 No Yes exposed I87.2 Venous insufficiency (chronic) (peripheral) 09/26/2021 No Yes I89.0 Lymphedema, not elsewhere classified 09/26/2021 No Yes L97.822 Non-pressure chronic ulcer of other part of left lower leg with fat layer 09/26/2021 No Yes exposed I10 Essential (primary) hypertension 09/26/2021 No Yes Inactive Problems Resolved Problems Electronic Signature(s) Signed: 12/06/2021 4:01:56 PM By: Laurina Bustle Signed: 12/06/2021 4:59:17 PM By: Lenda Kelp PA-C Previous Signature: 12/05/2021 10:02:40 AM Version By: Lenda Kelp PA-C Entered By: Laurina Bustle on 12/05/2021 10:49:03 Martin Riddle ([id number]) -------------------------------------------------------------------------------- Progress Note Details Patient Name: Martin Riddle. Date of Service: 12/05/2021 9:45 AM Medical Record Number: [id number] Patient Account Number: [id number] Date of Birth/Sex: Mar 25, 1971 (51 y.o. M) Treating RN: Huel Coventry Primary Care Provider: Marletta Lor Other Clinician: Referring Provider: Marletta Lor Treating Provider/Extender: Rowan Blase in Treatment: 10 Subjective Chief Complaint Information obtained from Patient Bilateral foot ulcers and left leg ulcer History of Present Illness (HPI) 09-26-2021 upon evaluation today patient presents today for evaluation of wounds on the right heel, left lower leg, and left fifth metatarsal head location more plantar. With that being said these wounds have been present for since around June 09, 2021. Currently the patient has had dressings  on that were unsure exactly how long they have been in place he has legs that appear to be extremely dry and he had a lot of dead tissue and dry skin buildup. It does make me concerned about the fact that he has not been changing these dressings appropriately nor anywhere close to regularly. Currently with regard to the wounds he does seem to be doing decently well all things considered. That is at least good news. I see no signs of obvious active infection at the moment. The patient's most recent ABI was on 05/31/2021 and was on the right 1.15 and left 1.02 there was no arterial study performed for completeness nor a TBI. This may be something to consider going forward. Patient has otherwise a history of diabetes mellitus type 2, chronic venous insufficiency, lymphedema, and hypertension. 10-17-2021 upon evaluation today patient actually appears to be doing well today with regard to his leg on the left I am very pleased both with the foot as well as the leg I think we are on the right track here. As far as the wound on the right leg this is more circumferential and to be honest it actually has blue-green drainage consistent with Pseudomonas and appears to be significantly worse compared to last week's evaluation. I do believe that this is going to likely require some antibiotics I Minna send this into the pharmacy for him. 10-24-2021 upon evaluation today patient appears to be doing a little bit better in regard to his leg with regard to the color it does not appear to be quite as red as before he still has a tremendous amount of weeping. I do believe compression therapy would be beneficial for him but we are still waiting on the vascular study which should be upcoming. He tells me the 27th is the date that he has been given. Fortunately I do not see any signs of systemic infection right now. 10-31-2021 upon evaluation today patient's wounds in general are showing signs of improvement. Fortunately there  does not appear to be any evidence of active infection locally or systemically  which is great news the left lateral lower extremity he has significantly improved. The right heel is doing better although there is some need for debridement at both locations. 11-08-2021 upon evaluation today patient appears to be doing well with regard to his legs. He has been tolerating the dressing changes without complication. Fortunately there does not appear to be any evidence of active infection locally or systemically at this time which is great news. No fevers, chills, nausea, vomiting, or diarrhea. I do feel like he is making excellent progress.Marland Kitchen 12-05-2021 upon evaluation today patient appears to be doing well with regard to his wound. In fact everything appears to be completely healed which is great news. Fortunately I do not see any evidence of active infection locally or systemically at this time. No fevers, chills, nausea, vomiting, or diarrhea. Objective Constitutional Obese and well-hydrated in no acute distress. Vitals Time Taken: 10:19 AM, Height: 76 in, Weight: 330 lbs, BMI: 40.2, Temperature: 97.8 F, Pulse: 102 bpm, Respiratory Rate: 18 breaths/min, Blood Pressure: 119/78 mmHg. Respiratory normal breathing without difficulty. Psychiatric this patient is able to make decisions and demonstrates good insight into disease process. Alert and Oriented x 3. pleasant and cooperative. General Notes: Upon inspection patient's wound bed showed signs of excellent epithelization I feel like he is done well his swelling is tremendously down and he is very appreciative of how things appear today. Martin Riddle, Martin Riddle ([id number]) Integumentary (Hair, Skin) Wound #1 status is Healed - Epithelialized. Original cause of wound was Gradually Appeared. The date acquired was: 06/09/2021. The wound has been in treatment 10 weeks. The wound is located on the Right Calcaneus. The wound measures 0cm length x 0cm width x 0cm  depth; 0cm^2 area and 0cm^3 volume. There is Fat Layer (Subcutaneous Tissue) exposed. There is a medium amount of serous drainage noted. There is medium (34-66%) pink granulation within the wound bed. There is a medium (34-66%) amount of necrotic tissue within the wound bed. Wound #2 status is Healed - Epithelialized. Original cause of wound was Gradually Appeared. The date acquired was: 06/09/2021. The wound has been in treatment 10 weeks. The wound is located on the Left Lower Leg. The wound measures 0cm length x 0cm width x 0cm depth; 0cm^2 area and 0cm^3 volume. There is a none present amount of drainage noted. There is no granulation within the wound bed. There is no necrotic tissue within the wound bed. 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 Elevate legs to the level of the heart and pump ankles as often as possible Elevate leg(s) parallel to the floor when sitting. DO YOUR BEST to sleep in the bed at night. DO NOT sleep in your recliner. Long hours of sitting in a recliner leads to swelling of the legs and/or potential wounds on your backside. Additional Orders / Instructions: Follow Nutritious Diet and Increase Protein Intake - monitor blood sugar 1. I recommend that we going continue with the wound care measures as before and the patient is in agreement the plan. For now he is using Tubigrip although  I did give him information for urea cream to be used to help with  the dry skin. Also given the information for getting compression socks from [employer] here in town he is going to go try to pick some of those up which I think 8 to 15 mmHg compression will be sufficient. We will see the patient back for follow-up evaluation as needed if anything changes but I think if he wears the compression he is going to be doing just fine. Electronic Signature(s) Signed: 12/05/2021 11:07:38 AM By: Lenda Kelp PA-C Entered By: Lenda Kelp on 12/05/2021 11:07:37 Martin Riddle ([id number]) -------------------------------------------------------------------------------- SuperBill Details Patient Name: Martin Riddle, IVERY. Date of Service: 12/05/2021 Medical Record Number: [id number] Patient Account Number: [id number] Date of Birth/Sex: 1970/07/15 (51 y.o. M) Treating RN: Laurina Bustle Primary Care Provider: Marletta Lor Other Clinician: Referring Provider: Marletta Lor Treating Provider/Extender: Loudenslager Derry Weeks in Treatment: 10 Diagnosis Coding ICD-10 Codes Code Description E11.621 Type 2 diabetes mellitus with foot ulcer L97.522 Non-pressure chronic ulcer of other part of left foot with fat layer exposed L97.412 Non-pressure chronic ulcer of right heel and midfoot with fat layer exposed I87.2 Venous insufficiency (chronic) (peripheral) I89.0 Lymphedema, not elsewhere classified L97.822 Non-pressure chronic ulcer of other part of left lower leg with fat layer exposed I10 Essential (primary) hypertension Facility Procedures CPT4 Code: [id number] Description: [phone] - WOUND CARE VISIT-LEV 2 EST PT Modifier: Quantity: 1 Physician Procedures CPT4 Code: [id number] Description: 99213 - WC PHYS LEVEL 3 - EST PT Modifier: Quantity: 1 CPT4 Code: Description: ICD-10 Diagnosis Description E11.621 Type 2 diabetes mellitus with foot ulcer L97.522 Non-pressure chronic ulcer of other part of left foot with fat layer e L97.412 Non-pressure chronic ulcer of right heel and  midfoot with fat layer ex I87.2  Venous insufficiency (chronic) (peripheral) Modifier: xposed posed Quantity: Electronic Signature(s) Signed: 12/05/2021 11:08:01 AM By: Lenda Kelp PA-C Entered By: Lenda Kelp on 12/05/2021 11:08:01

## 2021-12-05 NOTE — Progress Notes (Addendum)
MOUA, RASMUSSON ([id number]) Visit Report for 12/05/2021 Arrival Information Details Patient Name: RENTON, Martin Riddle. Date of Service: 12/05/2021 9:45 AM Medical Record Number: [id number] Patient Account Number: [id number] Date of Birth/Sex: 10-22-1970 (51 y.o. M) Treating RN: Laurina Bustle Primary Care Arine Foley: Marletta Lor Other Clinician: Referring Reannah Totten: Marletta Lor Treating Deardra Hinkley/Extender: Rowan Blase in Treatment: 10 Visit Information History Since Last Visit Added or deleted any medications: No Patient Arrived: [place] Mor Any new allergies or adverse reactions: No Arrival Time: 10:15 Had a fall or experienced change in No Accompanied By: Wife activities of daily living that may affect Transfer Assistance: None risk of falls: Patient Identification Verified: Yes Hospitalized since last visit: No Secondary Verification Process Completed: Yes Has Dressing in Place as Prescribed: Yes Patient Requires Transmission-Based No Has Compression in Place as Prescribed: Yes Precautions: Pain Present Now: No Patient Has Alerts: Yes Patient Alerts: Patient on Blood Thinner DIABETIC aspirin 81mg  Electronic Signature(s) Signed: 12/06/2021 4:01:56 PM By: 12/08/2021 Entered By: Laurina Bustle on 12/05/2021 10:19:00 12/07/2021 (Olin Hauser) -------------------------------------------------------------------------------- Clinic Level of Care Assessment Details Patient Name: [id number]. Date of Service: 12/05/2021 9:45 AM Medical Record Number: 12/07/2021 Patient Account Number: [id number] Date of Birth/Sex: 23-Apr-1971 (51 y.o. M) Treating RN: 44 Primary Care Hriday Stai: Laurina Bustle Other Clinician: Referring Luciana Cammarata: Marletta Lor Treating Tonique Mendonca/Extender: Marletta Lor in Treatment: 10 Clinic Level of Care Assessment Items TOOL 4 Quantity Score []  - Use when only an EandM is performed on FOLLOW-UP visit 0 ASSESSMENTS - Nursing Assessment / Reassessment X -  Reassessment of Co-morbidities (includes updates in patient status) 1 10 X- 1 5 Reassessment of Adherence to Treatment Plan ASSESSMENTS - Wound and Skin Assessment / Reassessment X - Simple Wound Assessment / Reassessment - one wound 1 5 []  - 0 Complex Wound Assessment / Reassessment - multiple wounds []  - 0 Dermatologic / Skin Assessment (not related to wound area) ASSESSMENTS - Focused Assessment []  - Circumferential Edema Measurements - multi extremities 0 []  - 0 Nutritional Assessment / Counseling / Intervention []  - 0 Lower Extremity Assessment (monofilament, tuning fork, pulses) []  - 0 Peripheral Arterial Disease Assessment (using hand held doppler) ASSESSMENTS - Ostomy and/or Continence Assessment and Care []  - Incontinence Assessment and Management 0 []  - 0 Ostomy Care Assessment and Management (repouching, etc.) PROCESS - Coordination of Care X - Simple Patient / Family Education for ongoing care 1 15 []  - 0 Complex (extensive) Patient / Family Education for ongoing care X- 1 10 Staff obtains Rowan Blase, Records, Test Results / Process Orders []  - 0 Staff telephones HHA, Nursing Homes / Clarify orders / etc []  - 0 Routine Transfer to another Facility (non-emergent condition) []  - 0 Routine Hospital Admission (non-emergent condition) []  - 0 New Admissions / / Ordering NPWT, Apligraf, etc. []  - 0 Emergency Hospital Admission (emergent condition) []  - 0 Simple Discharge Coordination []  - 0 Complex (extensive) Discharge Coordination PROCESS - Special Needs []  - Pediatric / Minor Patient Management 0 []  - 0 Isolation Patient Management []  - 0 Hearing / Language / Visual special needs []  - 0 Assessment of Community assistance (transportation, D/C planning, etc.) []  - 0 Additional assistance / Altered mentation []  - 0 Support Surface(s) Assessment (bed, cushion, seat, etc.) INTERVENTIONS - Wound Cleansing / Measurement RENE, SIZELOVE.  ( ) []  - 0 Simple Wound Cleansing - one wound X- 2 5 Complex Wound Cleansing - multiple wounds X- 1 5 Wound Imaging (photographs - any number of wounds) []  -  0 Wound Tracing (instead of photographs) []  - 0 Simple Wound Measurement - one wound []  - 0 Complex Wound Measurement - multiple wounds INTERVENTIONS - Wound Dressings []  - Small Wound Dressing one or multiple wounds 0 []  - 0 Medium Wound Dressing one or multiple wounds []  - 0 Large Wound Dressing one or multiple wounds []  - 0 Application of Medications - topical []  - 0 Application of Medications - injection INTERVENTIONS - Miscellaneous []  - External ear exam 0 []  - 0 Specimen Collection (cultures, biopsies, blood, body fluids, etc.) []  - 0 Specimen(s) / Culture(s) sent or taken to Lab for analysis []  - 0 Patient Transfer (multiple staff / / Similar devices) []  - 0 Simple Staple / Suture removal (25 or less) []  - 0 Complex Staple / Suture removal (26 or more) []  - 0 Hypo / Hyperglycemic Management (close monitor of Blood Glucose) []  - 0 Ankle / Brachial Index (ABI) - do not check if billed separately []  - 0 Vital Signs Has the patient been seen at the hospital within the last three years: Yes Total Score: 60 Level Of Care: New/Established - Level 2 Electronic Signature(s) Signed: 12/06/2021 4:01:56 PM By: Entered By: on 12/05/2021 12:05:09 ( ) -------------------------------------------------------------------------------- Encounter Discharge Information Details Patient Name: TIPTON, BALLOW. Date of Service: 12/05/2021 9:45 AM Medical Record Number: Patient Account Number: Date of Birth/Sex: 08-Apr-1971 (51 y.o. M) Treating RN: Primary Care Laquan Beier: Other Clinician: Referring Kaelah Hayashi: Treating Yariah Selvey/Extender: in Treatment: 10 Encounter Discharge Information  Items Discharge Condition: Stable Ambulatory Status: Cane Discharge Destination: Home Transportation: Other Accompanied By: Wife Schedule Follow-up Appointment: No Clinical Summary of Care: Electronic Signature(s) Signed: 12/06/2021 4:01:56 PM By: Laurina Bustle Entered By: Laurina Bustle on 12/05/2021 10:49:40 Olin Hauser ([id number]) -------------------------------------------------------------------------------- Lower Extremity Assessment Details Patient Name: BENUEL, Martin Riddle. Date of Service: 12/05/2021 9:45 AM Medical Record Number: [id number] Patient Account Number: [id number] Date of Birth/Sex: Nov 27, 1970 (51 y.o. M) Treating RN: Laurina Bustle Primary Care Katalina Magri: Marletta Lor Other Clinician: Referring Shareta Fishbaugh: Marletta Lor Treating Bennetta Rudden/Extender: Rowan Blase Weeks in Treatment: 10 Edema Assessment Assessed: 12/08/2021: No] Laurina Bustle: No] Edema: [Left: Yes] [Right: Yes] Calf Left: Right: Point of Measurement: From Medial Instep 46 cm 51 cm Ankle Left: Right: Point of Measurement: From Medial Instep 32 cm 30 cm Vascular Assessment Pulses: Dorsalis Pedis Palpable: [Left:Yes] [Right:Yes] Electronic Signature(s) Signed: 12/06/2021 4:01:56 PM By: 12/07/2021 Entered By: Olin Hauser on 12/05/2021 10:43:53 Olin Hauser (12/07/2021) -------------------------------------------------------------------------------- Multi Wound Chart Details Patient Name: [id number]. Date of Service: 12/05/2021 9:45 AM Medical Record Number: 01/12/1971 Patient Account Number: [id number] Date of Birth/Sex: 1970-08-28 (51 y.o. M) Treating RN: Marletta Lor Primary Care Cashel Bellina: Kabir Derry Other Clinician: Referring Carla Whilden: Kyra Searles Treating Malak Duchesneau/Extender: Franne Forts in Treatment: 10 Vital Signs Height(in): 76 Pulse(bpm): 102 Weight(lbs): 330 Blood Pressure(mmHg): 119/78 Body Mass Index(BMI): 40.2 Temperature(F): 97.8 Respiratory Rate(breaths/min): 18 Photos:  [N/A:N/A] Wound Location: Right Calcaneus Left Lower Leg N/A Wounding Event: Gradually Appeared Gradually Appeared N/A Primary Etiology: Diabetic Wound/Ulcer of the Lower Diabetic Wound/Ulcer of the Lower N/A Extremity Extremity Comorbid History: Hypertension, Type II Diabetes Hypertension, Type II Diabetes N/A Date Acquired: 06/09/2021 06/09/2021 N/A Weeks of Treatment: 10 10 N/A Wound Status: Healed - Epithelialized Healed - Epithelialized N/A Wound Recurrence: No No N/A Clustered Wound: Yes No N/A Clustered Quantity: 2 N/A N/A Measurements L x W x D (cm) 0x0x0  0x0x0 N/A Area (cm) : 0 0 N/A Volume (cm) : 0 0 N/A % Reduction in Area: 100.00% 100.00% N/A % Reduction in Volume: 100.00% 100.00% N/A Classification: Grade 2 Grade 2 N/A Exudate Amount: Medium None Present N/A Exudate Type: Serous N/A N/A Exudate Color: amber N/A N/A Granulation Amount: Medium (34-66%) None Present (0%) N/A Granulation Quality: Pink N/A N/A Necrotic Amount: Medium (34-66%) None Present (0%) N/A Exposed Structures: Fat Layer (Subcutaneous Tissue): Fascia: No N/A Yes Fat Layer (Subcutaneous Tissue): Fascia: No No Tendon: No Tendon: No Muscle: No Muscle: No Joint: No Joint: No Bone: No Bone: No Epithelialization: Small (1-33%) Large (67-100%) N/A Treatment Notes Electronic Signature(s) Signed: 12/06/2021 4:01:56 PM By: Laurina Bustle Entered By: Laurina Bustle on 12/05/2021 10:44:06 Olin Hauser ([id number]) -------------------------------------------------------------------------------- Multi-Disciplinary Care Plan Details Patient Name: OSBORN, Martin Riddle. Date of Service: 12/05/2021 9:45 AM Medical Record Number: [id number] Patient Account Number: [id number] Date of Birth/Sex: [birth year] (51 y.o. M) Treating RN: Laurina Bustle Primary Care Roan Sawchuk: Marletta Lor Other Clinician: Referring Hanya Guerin: Marletta Lor Treating Zanaria Morell/Extender: Danielski Derry Weeks in Treatment: 10 Active Inactive Electronic  Signature(s) Signed: 12/05/2021 12:06:01 PM By: Laurina Bustle Entered By: Laurina Bustle on 12/05/2021 12:06:01 EUNICE, OLDAKER ([id number]) -------------------------------------------------------------------------------- Pain Assessment Details Patient Name: BENSEN, Martin Riddle. Date of Service: 12/05/2021 9:45 AM Medical Record Number: [id number] Patient Account Number: [id number] Date of Birth/Sex: [birth year] (51 y.o. M) Treating RN: Laurina Bustle Primary Care Arya Boxley: Marletta Lor Other Clinician: Referring Marius Betts: Marletta Lor Treating Ghina Bittinger/Extender: Rowan Blase in Treatment: 10 Active Problems Location of Pain Severity and Description of Pain Patient Has Paino No Site Locations Pain Management and Medication Current Pain Management: Electronic Signature(s) Signed: 12/06/2021 4:01:56 PM By: Laurina Bustle Entered By: Laurina Bustle on 12/05/2021 10:20:04 Olin Hauser ([id number]) -------------------------------------------------------------------------------- Patient/Caregiver Education Details Patient Name: Martin Riddle, BUNTON. Date of Service: 12/05/2021 9:45 AM Medical Record Number: [id number] Patient Account Number: [id number] Date of Birth/Gender: 06-27-1970 (51 y.o. M) Treating RN: Laurina Bustle Primary Care Physician: Marletta Lor Other Clinician: Referring Physician: Marletta Lor Treating Physician/Extender: Rowan Blase in Treatment: 10 Education Assessment Education Provided To: Patient Education Topics Provided Electronic Signature(s) Signed: 12/06/2021 4:01:56 PM By: Laurina Bustle Entered By: Laurina Bustle on 12/05/2021 10:48:58 KYLER, Martin Riddle ([id number]) -------------------------------------------------------------------------------- Wound Assessment Details Patient Name: CASSON, Martin Riddle. Date of Service: 12/05/2021 9:45 AM Medical Record Number: [id number] Patient Account Number: [id number] Date of Birth/Sex: 1970-10-16 (51 y.o. M) Treating RN: Laurina Bustle Primary  Care Aarit Kashuba: Marletta Lor Other Clinician: Referring Marcena Dias: Marletta Lor Treating Hajira Verhagen/Extender: Rihn Derry Weeks in Treatment: 10 Wound Status Wound Number: 1 Primary Etiology: Diabetic Wound/Ulcer of the Lower Extremity Wound Location: Right Calcaneus Wound Status: Healed - Epithelialized Wounding Event: Gradually Appeared Comorbid History: Hypertension, Type II Diabetes Date Acquired: 06/09/2021 Weeks Of Treatment: 10 Clustered Wound: Yes Photos Wound Measurements Length: (cm) 0 % R Width: (cm) 0 % R Depth: (cm) 0 Epi Clustered Quantity: 2 Area: (cm) 0 Volume: (cm) 0 eduction in Area: 100% eduction in Volume: 100% thelialization: Small (1-33%) Wound Description Classification: Grade 2 Fo Exudate Amount: Medium Sl Exudate Type: Serous Exudate Color: amber ul Odor After Cleansing: No ough/Fibrino Yes Wound Bed Granulation Amount: Medium (34-66%) Exposed Structure Granulation Quality: Pink Fascia Exposed: No Necrotic Amount: Medium (34-66%) Fat Layer (Subcutaneous Tissue) Exposed: Yes Tendon Exposed: No Muscle Exposed: No Joint Exposed: No Bone Exposed: No Treatment Notes Wound #1 (Calcaneus) Wound Laterality: Right Cleanser Peri-Wound Care Topical NEFI, MUSICH ([id number]) Primary Dressing Secondary Dressing Secured  With Compression Wrap Compression Stockings Add-Ons Electronic Signature(s) Signed: 12/06/2021 4:01:56 PM By: Laurina Bustle Entered By: Laurina Bustle on 12/05/2021 10:43:43 LEONELL, LOBDELL ([id number]) -------------------------------------------------------------------------------- Wound Assessment Details Patient Name: NORIS, KULINSKI. Date of Service: 12/05/2021 9:45 AM Medical Record Number: [id number] Patient Account Number: [id number] Date of Birth/Sex: December 20, 1970 (51 y.o. M) Treating RN: Laurina Bustle Primary Care Jekhi Bolin: Marletta Lor Other Clinician: Referring Rontae Inglett: Marletta Lor Treating Yumalay Circle/Extender: Brosious Derry Weeks  in Treatment: 10 Wound Status Wound Number: 2 Primary Etiology: Diabetic Wound/Ulcer of the Lower Extremity Wound Location: Left Lower Leg Wound Status: Healed - Epithelialized Wounding Event: Gradually Appeared Comorbid History: Hypertension, Type II Diabetes Date Acquired: 06/09/2021 Weeks Of Treatment: 10 Clustered Wound: No Photos Wound Measurements Length: (cm) 0 % Red Width: (cm) 0 % Red Depth: (cm) 0 Epith Area: (cm) 0 Volume: (cm) 0 uction in Area: 100% uction in Volume: 100% elialization: Large (67-100%) Wound Description Classification: Grade 2 Foul Exudate Amount: None Present Slou Odor After Cleansing: No gh/Fibrino Yes Wound Bed Granulation Amount: None Present (0%) Exposed Structure Necrotic Amount: None Present (0%) Fascia Exposed: No Fat Layer (Subcutaneous Tissue) Exposed: No Tendon Exposed: No Muscle Exposed: No Joint Exposed: No Bone Exposed: No Treatment Notes Wound #2 (Lower Leg) Wound Laterality: Left Cleanser Peri-Wound Care Topical Primary Dressing KEBIN, MAYE ([id number]) Secondary Dressing Secured With Compression Wrap Compression Stockings Add-Ons Electronic Signature(s) Signed: 12/06/2021 4:01:56 PM By: Laurina Bustle Entered By: Laurina Bustle on 12/05/2021 10:42:43 Olin Hauser ([id number]) -------------------------------------------------------------------------------- Vitals Details Patient Name: Olin Hauser. Date of Service: 12/05/2021 9:45 AM Medical Record Number: [id number] Patient Account Number: [id number] Date of Birth/Sex: November 09, 1970 (51 y.o. M) Treating RN: Laurina Bustle Primary Care Reed Dady: Marletta Lor Other Clinician: Referring Adasha Boehme: Marletta Lor Treating Merve Hotard/Extender: Rowan Blase in Treatment: 10 Vital Signs Time Taken: 10:19 Temperature (F): 97.8 Height (in): 76 Pulse (bpm): 102 Weight (lbs): 330 Respiratory Rate (breaths/min): 18 Body Mass Index (BMI): 40.2 Blood Pressure (mmHg):  119/78 Reference Range: 80 - 120 mg / dl Electronic Signature(s) Signed: 12/06/2021 4:01:56 PM By: Laurina Bustle Entered By: Laurina Bustle on 12/05/2021 10:19:57

## 2022-01-07 DIAGNOSIS — M5416 Radiculopathy, lumbar region: Secondary | ICD-10-CM | POA: Insufficient documentation

## 2022-01-07 DIAGNOSIS — G894 Chronic pain syndrome: Secondary | ICD-10-CM | POA: Insufficient documentation

## 2022-01-07 DIAGNOSIS — Z981 Arthrodesis status: Secondary | ICD-10-CM | POA: Insufficient documentation

## 2022-01-07 DIAGNOSIS — M961 Postlaminectomy syndrome, not elsewhere classified: Secondary | ICD-10-CM | POA: Insufficient documentation

## 2022-01-07 DIAGNOSIS — M5412 Radiculopathy, cervical region: Secondary | ICD-10-CM | POA: Insufficient documentation

## 2022-01-14 ENCOUNTER — Inpatient Hospital Stay: Admission: RE | Admit: 2022-01-14 | Payer: Medicare Other | Source: Ambulatory Visit

## 2022-01-14 ENCOUNTER — Inpatient Hospital Stay
Admission: RE | Admit: 2022-01-14 | Discharge: 2022-01-14 | Disposition: A | Discharge status: Home / Self Care | Payer: Medicare Other | Source: Ambulatory Visit | Attending: Student | Admitting: Student

## 2022-01-14 NOTE — Discharge Instructions (Signed)

## 2022-03-07 NOTE — Progress Notes (Signed)
Martin HauserLLEN, Huck C. ([id number]) Visit Report for 10/24/2021 Arrival Information Details Patient Name: Martin HauserLLEN, Martin C. Date of Service: 10/24/2021 8:00 AM Medical Record Number: [id number] Patient Account Number: [id number] Date of Birth/Sex: 12/26/1970 (51 y.o. M) Treating RN: Huel CoventryWoody, Kim Primary Care Preslyn Warr: Marletta LorBarr, Julie Other Clinician: Referring Azreal Stthomas: Marletta LorBarr, Julie Treating Katelyn Kohlmeyer/Extender: Rowan BlaseStone, Hoyt Weeks in Treatment: 4 Visit Information History Since Last Visit Added or deleted any medications: No Patient Arrived: Ambulatory Any new allergies or adverse reactions: No Arrival Time: 08:18 Had a fall or experienced change in No Transfer Assistance: None activities of daily living that may affect Patient Requires Transmission-Based No risk of falls: Precautions: Hospitalized since last visit: No Patient Has Alerts: Yes Pain Present Now: Yes Patient Alerts: Patient on Blood Thinner DIABETIC aspirin 81mg  Electronic Signature(s) Signed: 03/07/2022 12:24:53 PM By: Betha LoaVenable, Angie Entered By: Betha LoaVenable, Angie on 10/24/2021 08:26:17 Martin Riddle, Rane C. ([id number]) -------------------------------------------------------------------------------- Clinic Level of Care Assessment Details Patient Name: Martin Riddle, Martin C. Date of Service: 10/24/2021 8:00 AM Medical Record Number: [id number] Patient Account Number: [id number] Date of Birth/Sex: 12/03/1970 ([age] y.o. M) Treating RN: Huel CoventryWoody, Kim Primary Care Rasool Rommel: Marletta LorBarr, Julie Other Clinician: Referring Punam Broussard: Marletta LorBarr, Julie Treating Ashe Graybeal/Extender: Rowan BlaseStone, Hoyt Weeks in Treatment: 4 Clinic Level of Care Assessment Items TOOL 4 Quantity Score []  - Use when only an EandM is performed on FOLLOW-UP visit 0 ASSESSMENTS - Nursing Assessment / Reassessment X - Reassessment of Co-morbidities (includes updates in patient status) 1 10 X- 1 5 Reassessment of Adherence to Treatment Plan ASSESSMENTS - Wound and Skin Assessment / Reassessment []  - Simple  Wound Assessment / Reassessment - one wound 0 X- 3 5 Complex Wound Assessment / Reassessment - multiple wounds []  - 0 Dermatologic / Skin Assessment (not related to wound area) ASSESSMENTS - Focused Assessment X - Circumferential Edema Measurements - multi extremities 1 5 []  - 0 Nutritional Assessment / Counseling / Intervention []  - 0 Lower Extremity Assessment (monofilament, tuning fork, pulses) []  - 0 Peripheral Arterial Disease Assessment (using hand held doppler) ASSESSMENTS - Ostomy and/or Continence Assessment and Care []  - Incontinence Assessment and Management 0 []  - 0 Ostomy Care Assessment and Management (repouching, etc.) PROCESS - Coordination of Care X - Simple Patient / Family Education for ongoing care 1 15 []  - 0 Complex (extensive) Patient / Family Education for ongoing care X- 1 10 Staff obtains Consents, Records, Test Results / Process Orders []  - 0 Staff telephones HHA, Nursing Homes / Clarify orders / etc []  - 0 Routine Transfer to another Facility (non-emergent condition) []  - 0 Routine Hospital Admission (non-emergent condition) []  - 0 New Admissions / Manufacturing engine[employer]surance Authorizations / Ordering NPWT, Apligraf, etc. []  - 0 Emergency Hospital Admission (emergent condition) X- 1 10 Simple Discharge Coordination []  - 0 Complex (extensive) Discharge Coordination PROCESS - Special Needs []  - Pediatric / Minor Patient Management 0 []  - 0 Isolation Patient Management []  - 0 Hearing / Language / Visual special needs []  - 0 Assessment of Community assistance (transportation, D/C planning, etc.) []  - 0 Additional assistance / Altered mentation []  - 0 Support Surface(s) Assessment (bed, cushion, seat, etc.) INTERVENTIONS - Wound Cleansing / Measurement Martin HauserLLEN, Avett C. ([id number]) []  - 0 Simple Wound Cleansing - one wound X- 3 5 Complex Wound Cleansing - multiple wounds X- 1 5 Wound Imaging (photographs - any number of wounds) []  - 0 Wound Tracing  (instead of photographs) []  - 0 Simple Wound Measurement - one wound X- 3 5 Complex Wound Measurement - multiple wounds  INTERVENTIONS - Wound Dressings []  - Small Wound Dressing one or multiple wounds 0 X- 2 15 Medium Wound Dressing one or multiple wounds []  - 0 Large Wound Dressing one or multiple wounds []  - 0 Application of Medications - topical []  - 0 Application of Medications - injection INTERVENTIONS - Miscellaneous []  - External ear exam 0 []  - 0 Specimen Collection (cultures, biopsies, blood, body fluids, etc.) []  - 0 Specimen(s) / Culture(s) sent or taken to Lab for analysis []  - 0 Patient Transfer (multiple staff / Lift / Similar devices) []  - 0 Simple Staple / Suture removal (25 or less) []  - 0 Complex Staple / Suture removal (26 or more) []  - 0 Hypo / Hyperglycemic Management (close monitor of Blood Glucose) []  - 0 Ankle / Brachial Index (ABI) - do not check if billed separately X- 1 5 Vital Signs Has the patient been seen at the hospital within the last three years: Yes Total Score: 140 Level Of Care: New/Established - Level 4 Electronic Signature(s) Signed: 03/07/2022 12:24:53 PM By: Entered By: on 10/24/2021 08:58:59 ( ) -------------------------------------------------------------------------------- Encounter Discharge Information Details Patient Name: Martin Riddle, Martin C. Date of Service: 10/24/2021 8:00 AM Medical Record Number: Patient Account Number: Date of Birth/Sex: May 31, 1971 (51 y.o. M) Treating RN: 03/09/2022 Primary Care Debhora Titus: Betha Loa Other Clinician: Referring Jaylie Neaves: Betha Loa Treating Artavious Trebilcock/Extender: 10/26/2021 in Treatment: 4 Encounter Discharge Information Items Discharge Condition: Stable Ambulatory Status: Ambulatory Discharge Destination: Home Transportation: Private Auto Accompanied By: self Schedule Follow-up Appointment:  Yes Clinical Summary of Care: Electronic Signature(s) Signed: 03/07/2022 12:24:53 PM By: [id number] Entered By: Dorothey Baseman on 10/24/2021 09:15:42 [id number] ([id number]) -------------------------------------------------------------------------------- Lower Extremity Assessment Details Patient Name: Martin Riddle, Martin Riddle. Date of Service: 10/24/2021 8:00 AM Medical Record Number: Huel Coventry Patient Account Number: Marletta Lor Date of Birth/Sex: 01/06/1971 (51 y.o. M) Treating RN: 03/09/2022 Primary Care Ethin Drummond: Betha Loa Other Clinician: Referring Jora Galluzzo: Betha Loa Treating Lorette Peterkin/Extender: 10/26/2021 Weeks in Treatment: 4 Edema Assessment Assessed: Martin Riddle: Yes] [id number]: Yes] Edema: [Left: Yes] [Right: Yes] Calf Left: Right: Point of Measurement: 36 cm From Medial Instep 51 cm 53 cm Ankle Left: Right: Point of Measurement: 14 cm From Medial Instep 29.5 cm 32 cm Vascular Assessment Pulses: Dorsalis Pedis Palpable: [Left:Yes] [Right:Yes] Electronic Signature(s) Signed: 10/24/2021 1:24:30 PM By: 10/26/2021, BSN, RN, CWS, Kim RN, BSN Signed: 03/07/2022 12:24:53 PM By: [id number] Entered By: 01/12/1971 on 10/24/2021 08:41:35 AUDON, HEYMANN (Marletta Lor) -------------------------------------------------------------------------------- Multi Wound Chart Details Patient Name: Martin Riddle, Martin Riddle. Date of Service: 10/24/2021 8:00 AM Medical Record Number: Kyra Searles Patient Account Number: Franne Forts Date of Birth/Sex: 06-Feb-1971 (51 y.o. 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 Extremity Extremity Comorbid History: Hypertension, Type II Diabetes Hypertension, Type II Diabetes Hypertension, Type II Diabetes Date Acquired: 06/09/2021 06/09/2021 06/09/2021 Weeks of Treatment: 4 4 4  Wound Status: Open Open Open Wound Recurrence: No No No Clustered Wound: Yes No No Clustered Quantity: 2 N/A N/A Measurements L x W x D (cm) 3x1.2x0.2  3x6x0.1 0.3x0.6x0.1 Area (cm) : 2.827 14.137 0.141 Volume (cm) : 0.565 1.414 0.014 % Reduction in Area: -79.90% -80.00% 25.00% % Reduction in Volume: -259.90% -80.10% 63.20% Classification: Grade 2 Grade 2 Grade 2 Exudate Amount: Medium Medium Medium Exudate Type: Serous Serosanguineous Serosanguineous Exudate Color: amber red, brown red, brown Granulation Amount: Small (1-33%) Medium (34-66%) None Present (0%) Granulation Quality: Pink Pink N/A Necrotic Amount: Large (67-100%) Medium (34-66%) Large (67-100%) Necrotic Tissue: Adherent [place] Exposed Structures: Fat Layer (Subcutaneous Tissue): Fat Layer (Subcutaneous Tissue): Fat Layer (Subcutaneous Tissue): Yes Yes Yes Fascia: No Fascia: No Fascia: No Tendon: No Tendon: No Tendon: No Muscle: No Muscle: No Muscle: No Joint: No Joint: No Joint: No Bone: No Bone: No Bone: No Epithelialization: None None None Treatment Notes Electronic Signature(s) Signed: 03/07/2022 12:24:53 PM By: Massie Kluver Entered By: Massie Kluver on 10/24/2021 08:48:25 JORGEN, WOLFINGER ([id number], ELENES ([id number]) -------------------------------------------------------------------------------- Multi-Disciplinary Care Plan Details Patient Name: SABASTIEN, TYLER. Date of Service: 10/24/2021 8:00 AM Medical Record Number: [id number] Patient Account Number: [id number] Date of Birth/Sex:  1971/03/27 (51 y.o. M) Treating RN: Cornell Barman Primary Care Kamirah Shugrue: Alvester Chou Other Clinician: Referring Pete Schnitzer: Alvester Chou Treating Deziah Renwick/Extender: Skipper Cliche in Treatment: 4 Active Inactive Nutrition Nursing Diagnoses: Potential for alteratiion in Nutrition/Potential for imbalanced nutrition Goals: Patient/caregiver verbalizes understanding of need to maintain therapeutic glucose control per primary care physician Date Initiated: 09/26/2021 Target Resolution Date: 10/26/2021 Goal Status: Active Interventions: Assess HgA1c results as ordered upon admission and as needed Assess patient nutrition upon admission and as needed per policy Notes: Soft Tissue Infection Nursing Diagnoses: Impaired tissue integrity Knowledge deficit related to disease process and management Knowledge deficit related to home infection control: handwashing, handling of soiled dressings, supply storage Potential for infection: soft tissue Goals: Patient's soft tissue infection will resolve Date Initiated: 10/24/2021 Target Resolution Date: 10/31/2021 Goal Status: Active Interventions: Assess signs and symptoms of infection every visit Provide education on infection Notes: Wound/Skin Impairment Nursing Diagnoses: Knowledge deficit related to ulceration/compromised skin integrity Goals: Patient/caregiver will verbalize understanding of skin care regimen Date Initiated: 09/26/2021 Target Resolution Date: 10/26/2021 Goal Status: Active Ulcer/skin breakdown will have a volume reduction of 30% by week 4 Date Initiated: 09/26/2021 Target Resolution Date: 10/26/2021 Goal Status: Active Ulcer/skin breakdown will have a volume reduction of 50% by week 8 Date Initiated: 09/26/2021 Target Resolution Date: 11/26/2021 Goal Status: Active Ulcer/skin breakdown will have a volume reduction of 80% by week 12 Date Initiated: 09/26/2021 Target Resolution Date: 12/26/2021 Goal Status: Active JOHNATHYN, VISCOMI  ([id number]) Ulcer/skin breakdown will heal within 14 weeks Date Initiated: 09/26/2021 Target Resolution Date: 01/26/2022 Goal Status: Active Interventions: Assess patient/caregiver ability to obtain necessary supplies Assess patient/caregiver ability to perform ulcer/skin care regimen upon admission and as needed Assess ulceration(s) every visit Notes: Electronic Signature(s) Signed: 10/24/2021 1:24:30 PM By: Gretta Cool, BSN, RN, CWS, Kim RN, BSN Signed: 03/07/2022 12:24:53 PM By: Massie Kluver Entered By: Massie Kluver on 10/24/2021 08:48:09 TERREZ, ANDER ([id number]) -------------------------------------------------------------------------------- Pain Assessment Details Patient Name: DEANTRE, BOURDON. Date of Service: 10/24/2021 8:00 AM Medical Record Number: [id number] Patient Account Number: [id number] Date of Birth/Sex: 1970-08-08 (52 y.o. M) Treating RN: Cornell Barman Primary Care Milly Goggins: Alvester Chou Other Clinician: Referring Aundrea Higginbotham: Alvester Chou Treating Ramin Zoll/Extender: Skipper Cliche in Treatment: 4 Active Problems Location of Pain Severity and Description of Pain Patient Has Paino Yes Site Locations Pain Location: Pain in Ulcers Duration of the Pain. Constant / Intermittento Constant Rate the pain.  Current Pain Level: 6 Worst Pain Level: 9 Least Pain Level: 4 Character of Pain Describe the Pain: Other: stinging Pain Management and Medication Current Pain Management: Medication: Yes Electronic Signature(s) Signed: 10/24/2021 1:24:30 PM By: Elliot Gurney, BSN, RN, CWS, Kim RN, BSN Signed: 03/07/2022 12:24:53 PM By: Betha Loa Entered By: Betha Loa on 10/24/2021 08:28:29 Martin Riddle ([id number]) -------------------------------------------------------------------------------- Patient/Caregiver Education Details Patient Name: OSAZE, HUBBERT. Date of Service: 10/24/2021 8:00 AM Medical Record Number: [id number] Patient Account Number: [id number] Date of  Birth/Gender: March 27, 1971 (51 y.o. M) Treating RN: Huel Coventry Primary Care Physician: Marletta Lor Other Clinician: Referring Physician: Marletta Lor Treating Physician/Extender: Rowan Blase in Treatment: 4 Education Assessment Education Provided To: Patient and Caregiver Education Topics Provided Infection: Handouts: Infection Prevention and Management, Other: continue antibiotics Methods: Demonstration, Explain/Verbal Responses: State content correctly Electronic Signature(s) Signed: 03/07/2022 12:24:53 PM By: Betha Loa Entered By: Betha Loa on 10/24/2021 09:14:55 Martin Riddle ([id number]) -------------------------------------------------------------------------------- Wound Assessment Details Patient Name: RODRICKUS, MIN. Date of Service: 10/24/2021 8:00 AM Medical Record Number: [id number] Patient Account Number: [id number] Date of Birth/Sex: 1970/12/06 (51 y.o. M) Treating RN: Huel Coventry Primary Care Sherrick Araki: Marletta Lor Other Clinician: Referring Lisbet Busker: Marletta Lor Treating Jermey Closs/Extender: Muccio Derry Weeks in Treatment: 4 Wound Status Wound Number: 1 Primary Etiology: Diabetic Wound/Ulcer of the Lower Extremity Wound Location: Right Calcaneus Wound Status: Open Wounding Event: Gradually Appeared Comorbid History: Hypertension, Type II Diabetes Date Acquired: 06/09/2021 Weeks Of Treatment: 4 Clustered Wound: Yes Photos Wound Measurements Length: (cm) 3 Width: (cm) 1.2 Depth: (cm) 0.2 Clustered Quantity: 2 Area: (cm) 2.827 Volume: (cm) 0.565 % Reduction in Area: -79.9% % Reduction in Volume: -259.9% Epithelialization: None Wound Description Classification: Grade 2 Exudate Amount: Medium Exudate Type: Serous Exudate Color: amber Foul Odor After Cleansing: No Slough/Fibrino Yes Wound Bed Granulation Amount: Small (1-33%) Exposed Structure Granulation Quality: Pink Fascia Exposed: No Necrotic Amount: Large (67-100%) Fat Layer  (Subcutaneous Tissue) Exposed: Yes Necrotic Quality: Adherent Slough Tendon Exposed: No Muscle Exposed: No Joint Exposed: No Bone Exposed: No Electronic Signature(s) Signed: 10/24/2021 1:24:30 PM By: Elliot Gurney, BSN, RN, CWS, Kim RN, BSN Signed: 03/07/2022 12:24:53 PM By: Betha Loa Entered By: Betha Loa on 10/24/2021 08:35:46 SHELLY, SHOULTZ ([id number]) -------------------------------------------------------------------------------- Wound Assessment Details Patient Name: Martin Riddle, Martin C. Date of Service: 10/24/2021 8:00 AM Medical Record Number: [id number] Patient Account Number: [id number] Date of Birth/Sex: 11-22-1970 (51 y.o. M) Treating RN: Huel Coventry Primary Care Ieshia Hatcher: Marletta Lor Other Clinician: Referring Nayelie Gionfriddo: Marletta Lor Treating Jereline Ticer/Extender: Wanke Derry Weeks in Treatment: 4 Wound Status Wound Number: 2 Primary Etiology: Diabetic Wound/Ulcer of the Lower Extremity Wound Location: Left Lower Leg Wound Status: Open Wounding Event: Gradually Appeared Comorbid History: Hypertension, Type II Diabetes Date Acquired: 06/09/2021 Weeks Of Treatment: 4 Clustered Wound: No Photos Wound Measurements Length: (cm) 3 Width: (cm) 6 Depth: (cm) 0.1 Area: (cm) 14.137 Volume: (cm) 1.414 % Reduction in Area: -80% % Reduction in Volume: -80.1% Epithelialization: None Wound Description Classification: Grade 2 Exudate Amount: Medium Exudate Type: Serosanguineous Exudate Color: red, brown Foul Odor After Cleansing: No Slough/Fibrino Yes Wound Bed Granulation Amount: Medium (34-66%) Exposed Structure Granulation Quality: Pink Fascia Exposed: No Necrotic Amount: Medium (34-66%) Fat Layer (Subcutaneous Tissue) Exposed: Yes Necrotic Quality: Adherent Slough Tendon Exposed: No Muscle Exposed: No Joint Exposed: No Bone Exposed: No Electronic Signature(s) Signed: 10/24/2021 1:24:30 PM By: Elliot Gurney, BSN, RN, CWS, Kim RN, BSN Signed: 03/07/2022 12:24:53 PM By: Betha Loa Entered By: Betha Loa on 10/24/2021 08:38:45 Hansen, Twain C. (  [id number]) -------------------------------------------------------------------------------- Wound Assessment Details Patient Name: Martin Riddle, Martin Riddle. Date of Service: 10/24/2021 8:00 AM Medical Record Number: [id number] Patient Account Number: [id number] Date of Birth/Sex: 1971-03-05 (51 y.o. M) Treating RN: Huel Coventry Primary Care Zebedee Segundo: Marletta Lor Other Clinician: Referring Lynk Marti: Marletta Lor Treating Brittney Caraway/Extender: Yanda Derry Weeks in Treatment: 4 Wound Status Wound Number: 3 Primary Etiology: Diabetic Wound/Ulcer of the Lower Extremity Wound Location: Left Metatarsal head fifth Wound Status: Healed - Epithelialized Wounding Event: Gradually Appeared Comorbid History: Hypertension, Type II Diabetes Date Acquired: 06/09/2021 Weeks Of Treatment: 4 Clustered Wound: No Photos Wound Measurements Length: (cm) 0 % Re Width: (cm) 0 % Re Depth: (cm) 0 Epit Area: (cm) 0 Volume: (cm) 0 duction in Area: 100% duction in Volume: 100% helialization: None Wound Description Classification: Grade 2 Fou Exudate Amount: Medium Slo Exudate Type: Serosanguineous Exudate Color: red, brown l Odor After Cleansing: No ugh/Fibrino Yes Wound Bed Granulation Amount: None Present (0%) Exposed Structure Necrotic Amount: Large (67-100%) Fascia Exposed: No Necrotic Quality: Eschar Fat Layer (Subcutaneous Tissue) Exposed: Yes Tendon Exposed: No Muscle Exposed: No Joint Exposed: No Bone Exposed: No Treatment Notes Wound #3 (Metatarsal head fifth) Wound Laterality: Left Cleanser Peri-Wound Care Topical Primary Dressing JANELLE, CULTON ([id number]) Secondary Dressing Secured With Compression Wrap Compression Stockings Add-Ons Electronic Signature(s) Signed: 10/24/2021 1:24:30 PM By: Elliot Gurney, BSN, RN, CWS, Kim RN, BSN Signed: 03/07/2022 12:24:53 PM By: Betha Loa Entered By: Betha Loa on 10/24/2021  08:51:17 Martin Riddle ([id number]) -------------------------------------------------------------------------------- Vitals Details Patient Name: MICHAEAL, Martin C. Date of Service: 10/24/2021 8:00 AM Medical Record Number: [id number] Patient Account Number: [id number] Date of Birth/Sex: 1971-01-24 (51 y.o. M) Treating RN: Huel Coventry Primary Care Ayesha Markwell: Marletta Lor Other Clinician: Referring Rebecah Dangerfield: Marletta Lor Treating Xzavien Harada/Extender: Rowan Blase in Treatment: 4 Vital Signs Time Taken: 08:26 Temperature (F): 98.2 Height (in): 76 Pulse (bpm): 99 Weight (lbs): 330 Respiratory Rate (breaths/min): 16 Body Mass Index (BMI): 40.2 Blood Pressure (mmHg): 167/80 Reference Range: 80 - 120 mg / dl Electronic Signature(s) Signed: 03/07/2022 12:24:53 PM By: Betha Loa Entered By: Betha Loa on 10/24/2021 08:26:49

## 2022-03-25 ENCOUNTER — Ambulatory Visit: Payer: Medicare Other | Admitting: Internal Medicine

## 2022-07-06 NOTE — Progress Notes (Unsigned)
Patient: Martin Riddle  Service Category: E/M  Provider: Oswaldo Done, MD  DOB: 02/23/1971  DOS: 07/07/2022  Referring Provider: Marletta Lor, NP  MRN: [id number]  Setting: Ambulatory outpatient  PCP: Marletta Lor, NP  Type: New Patient  Specialty: Interventional Pain Management    Location: Office  Delivery: Face-to-face     Primary Reason(s) for Visit: Encounter for initial evaluation of one or more chronic problems (new to examiner) potentially causing chronic pain, and posing a threat to normal musculoskeletal function. (Level of risk: High) CC: No chief complaint on file.  HPI  Mr. Osuna is a 52 y.o. year old, male patient, who comes for the first time to our practice referred by Marletta Lor, NP for our initial evaluation of his chronic pain. He has Congenital flat foot; DDD (degenerative disc disease), lumbar; Diabetes mellitus, type 2 (HCC); Hypercholesteremia; Gastro-esophageal reflux disease without esophagitis; Peripheral neuropathy; Severe recurrent major depression with psychotic features (HCC); Respiratory failure (HCC); Cellulitis; Multiple open wounds of lower extremity, initial encounter; and Chronic venous stasis dermatitis of both lower extremities on their problem list. Today he comes in for evaluation of his No chief complaint on file.  Pain Assessment: Location:     Radiating:   Onset:   Duration:   Quality:   Severity:  /10 (subjective, self-reported pain score)  Effect on ADL:   Timing:   Modifying factors:   BP:    HR:    Onset and Duration: {Hx; Onset and Duration:210120511} Cause of pain: {Hx; Cause:210120521} Severity: {Pain Severity:210120502} Timing: {Symptoms; Timing:210120501} Aggravating Factors: {Causes; Aggravating pain factors:210120507} Alleviating Factors: {Causes; Alleviating Factors:210120500} Associated Problems: {Hx; Associated problems:210120515} Quality of Pain: {Hx; Symptom quality or Descriptor:210120531} Previous Examinations or  Tests: {Hx; Previous examinations or test:210120529} Previous Treatments: {Hx; Previous Treatment:210120503}  Mr. Liptak is being evaluated for possible interventional pain management therapies for the treatment of his chronic pain.   ***  Mr. Kushnir has been informed that this initial visit was an evaluation only.  On the follow up appointment I Martin go over the results, including ordered tests and available interventional therapies. At that time he Martin have the opportunity to decide whether to proceed with offered therapies or not. In the event that Mr. Foiles prefers avoiding interventional options, this Martin conclude our involvement in the case.  Medication management recommendations may be provided upon request.  Historic Controlled Substance Pharmacotherapy Review  PMP and historical list of controlled substances: ***  Most recently prescribed opioid analgesics:   *** MME/day: *** mg/day  Historical Monitoring: The patient  reports no history of drug use. List of prior UDS Testing: Lab Results  Component Value Date   MDMA NONE DETECTED 01/08/2017   COCAINSCRNUR NONE DETECTED 01/08/2017   PCPSCRNUR NONE DETECTED 01/08/2017   THCU NONE DETECTED 01/08/2017   ETH 184 (H) 01/08/2017   Historical Background Evaluation: [place] PMP: PDMP reviewed during this encounter. Review of the past 79-months conducted.             PMP NARX Score Report:  Narcotic: *** Sedative: *** Stimulant: *** [place] Department of public safety, offender search: Engineer, mining Information) Non-contributory Risk Assessment Profile: Aberrant behavior: None observed or detected today Risk factors for fatal opioid overdose: None identified today PMP NARX Overdose Risk Score: *** Fatal overdose hazard ratio (HR): Calculation deferred Non-fatal overdose hazard ratio (HR): Calculation deferred Risk of opioid abuse or dependence: 0.7-3.0% with doses ? 36 MME/day and 6.1-26% with doses ? 120 MME/day. Substance use disorder (SUD)  risk level: See below Personal History of Substance Abuse (SUD-Substance use disorder):  Alcohol:    Illegal Drugs:    Rx Drugs:    ORT Risk Level calculation:    ORT Scoring interpretation table:  Score <3 = Low Risk for SUD  Score between 4-7 = Moderate Risk for SUD  Score >8 = High Risk for Opioid Abuse   PHQ-2 Depression Scale:  Total score:    PHQ-2 Scoring interpretation table: (Score and probability of major depressive disorder)  Score 0 = No depression  Score 1 = 15.4% Probability  Score 2 = 21.1% Probability  Score 3 = 38.4% Probability  Score 4 = 45.5% Probability  Score 5 = 56.4% Probability  Score 6 = 78.6% Probability   PHQ-9 Depression Scale:  Total score:    PHQ-9 Scoring interpretation table:  Score 0-4 = No depression  Score 5-9 = Mild depression  Score 10-14 = Moderate depression  Score 15-19 = Moderately severe depression  Score 20-27 = Severe depression (2.4 times higher risk of SUD and 2.89 times higher risk of overuse)   Pharmacologic Plan: As per protocol, I have not taken over any controlled substance management, pending the results of ordered tests and/or consults.            Initial impression: Pending review of available data and ordered tests.  Meds   Current Outpatient Medications:    acetaminophen (TYLENOL) 500 MG tablet, Take 1,000 mg by mouth every 6 (six) hours as needed for mild pain or moderate pain., Disp: , Rfl:    atorvastatin (LIPITOR) 20 MG tablet, Take 1 tablet (20 mg total) by mouth daily., Disp: 90 tablet, Rfl: 1   busPIRone (BUSPAR) 15 MG tablet, Take 15 mg by mouth 3 (three) times daily., Disp: , Rfl:    cetirizine (ZYRTEC) 10 MG tablet, Take 10 mg by mouth daily., Disp: , Rfl:    cloNIDine (CATAPRES) 0.2 MG tablet, Take 0.2 mg by mouth 3 (three) times daily., Disp: , Rfl:    DULoxetine (CYMBALTA) 60 MG capsule, Take 60 mg by mouth daily., Disp: , Rfl:    gemfibrozil (LOPID) 600 MG tablet, Take 1 tablet (600 mg total) by mouth 2  (two) times daily before a meal., Disp: 60 tablet, Rfl: 0   insulin NPH Human (NOVOLIN N) 100 UNIT/ML injection, Inject 0-50 Units into the skin 2 (two) times daily., Disp: , Rfl:    insulin regular (NOVOLIN R) 100 units/mL injection, Inject into the skin See admin instructions. Inject twice daily according to blood glucose reading, Disp: , Rfl:    losartan-hydrochlorothiazide (HYZAAR) 100-25 MG tablet, Take 1 tablet by mouth daily., Disp: , Rfl:    Multiple Vitamin (MULTIVITAMIN WITH MINERALS) TABS tablet, Take 1 tablet by mouth daily., Disp: 90 tablet, Rfl: 1   naproxen sodium (ALEVE) 220 MG tablet, Take 440 mg by mouth 2 (two) times daily as needed (pain)., Disp: , Rfl:    polyethylene glycol (MIRALAX / GLYCOLAX) packet, Take 17 g by mouth daily., Disp: 30 each, Rfl: 0   pregabalin (LYRICA) 25 MG capsule, Take 1 capsule (25 mg total) by mouth 3 (three) times daily., Disp: 90 capsule, Rfl: 0   torsemide (DEMADEX) 20 MG tablet, Take 1 tablet (20 mg total) by mouth daily., Disp: 30 tablet, Rfl: 1  Imaging Review  Cervical Imaging: Cervical MR wo contrast: No results found for this or any previous visit.  Cervical MR wo contrast: No valid procedures specified. Cervical MR w/wo contrast:  No results found for this or any previous visit.  Cervical MR w contrast: No results found for this or any previous visit.  Cervical CT wo contrast: No results found for this or any previous visit.  Cervical CT w/wo contrast: No results found for this or any previous visit.  Cervical CT w/wo contrast: No results found for this or any previous visit.  Cervical CT w contrast: Results for orders placed during the hospital encounter of 07/07/18  CT CERVICAL SPINE W CONTRAST  Narrative CLINICAL DATA:  Left arm pain, numbness and weakness.  FLUOROSCOPY TIME:  1 minutes 32 seconds. 308.51 micro gray meter squared  PROCEDURE: LUMBAR PUNCTURE FOR CERVICAL MYELOGRAM  After thorough discussion of risks and  benefits of the procedure including bleeding, infection, injury to nerves, blood vessels, adjacent structures as well as headache and CSF leak, written and oral informed consent was obtained. Consent was obtained by Dr. Nelson Chimes. We discussed the high likelihood of obtaining a diagnostic study.  Patient was positioned prone on the fluoroscopy table. Local anesthesia was provided with 1% lidocaine without epinephrine after prepped and draped in the usual sterile fashion. Puncture was performed at L2-3 using a 5 inch 22-gauge spinal needle via right paramedian approach. Using a single pass through the dura, the needle was placed within the thecal sac, with return of clear CSF. 10 mL of Isovue M-300 was injected into the thecal sac, with normal opacification of the nerve roots and cauda equina consistent with free flow within the subarachnoid space. The patient was then moved to the trendelenburg position and contrast flowed into the Cervical spine region.  I personally performed the lumbar puncture and administered the intrathecal contrast. I also personally performed acquisition of the myelogram images.  TECHNIQUE: Contiguous axial images were obtained through the Cervical spine after the intrathecal infusion of infusion. Coronal and sagittal reconstructions were obtained of the axial image sets. An additional set of axial imaging was performed through the mid and lower cervical spine using increased technique, due to the patient's size.  FINDINGS: CERVICAL MYELOGRAM FINDINGS:  Small anterior extradural defects at C4-5, C5-6 and C6-7. No discernible nerve root compression at myelography. See results of CT. No central canal stenosis.  CT CERVICAL MYELOGRAM FINDINGS:  Normal alignment. No focal bone lesion. No abnormality at the foramen magnum, C1-2, C2-3, or C3-4.  C4-5: Small endplate osteophytes and disc bulge. No canal or foraminal stenosis.  C5-6: Small endplate  osteophytes. No apparent compressive canal or foraminal stenosis.  C6-7: Small endplate osteophytes. No apparent compressive canal or foraminal stenosis.  C7-T1: Normal interspace.  IMPRESSION: No spinal cause of the left-sided symptoms is identified. Ordinary mild spondylosis at C4-5, C5-6 and C6-7 but no evidence of compressive canal or foraminal stenosis.   Electronically Signed By: Nelson Chimes M.D. On: 07/07/2018 10:59  Cervical CT outside: No results found for this or any previous visit.  Cervical DG 1 view: No results found for this or any previous visit.  Cervical DG 2-3 views: No results found for this or any previous visit.  Cervical DG F/E views: No results found for this or any previous visit.  Cervical DG 2-3 clearing views: No results found for this or any previous visit.  Cervical DG Bending/F/E views: No results found for this or any previous visit.  Cervical DG complete: No results found for this or any previous visit.  Cervical DG Myelogram views: No results found for this or any previous visit.  Cervical DG Myelogram  views: Results for orders placed during the hospital encounter of 07/07/18  DG MYELOGRAPHY LUMBAR INJ CERVICAL  Narrative CLINICAL DATA:  Left arm pain, numbness and weakness.  FLUOROSCOPY TIME:  1 minutes 32 seconds. 308.51 micro gray meter squared  PROCEDURE: LUMBAR PUNCTURE FOR CERVICAL MYELOGRAM  After thorough discussion of risks and benefits of the procedure including bleeding, infection, injury to nerves, blood vessels, adjacent structures as well as headache and CSF leak, written and oral informed consent was obtained. Consent was obtained by Dr. Paulina FusiMark Shogry. We discussed the high likelihood of obtaining a diagnostic study.  Patient was positioned prone on the fluoroscopy table. Local anesthesia was provided with 1% lidocaine without epinephrine after prepped and draped in the usual sterile fashion. Puncture  was performed at L2-3 using a 5 inch 22-gauge spinal needle via right paramedian approach. Using a single pass through the dura, the needle was placed within the thecal sac, with return of clear CSF. 10 mL of Isovue M-300 was injected into the thecal sac, with normal opacification of the nerve roots and cauda equina consistent with free flow within the subarachnoid space. The patient was then moved to the trendelenburg position and contrast flowed into the Cervical spine region.  I personally performed the lumbar puncture and administered the intrathecal contrast. I also personally performed acquisition of the myelogram images.  TECHNIQUE: Contiguous axial images were obtained through the Cervical spine after the intrathecal infusion of infusion. Coronal and sagittal reconstructions were obtained of the axial image sets. An additional set of axial imaging was performed through the mid and lower cervical spine using increased technique, due to the patient's size.  FINDINGS: CERVICAL MYELOGRAM FINDINGS:  Small anterior extradural defects at C4-5, C5-6 and C6-7. No discernible nerve root compression at myelography. See results of CT. No central canal stenosis.  CT CERVICAL MYELOGRAM FINDINGS:  Normal alignment. No focal bone lesion. No abnormality at the foramen magnum, C1-2, C2-3, or C3-4.  C4-5: Small endplate osteophytes and disc bulge. No canal or foraminal stenosis.  C5-6: Small endplate osteophytes. No apparent compressive canal or foraminal stenosis.  C6-7: Small endplate osteophytes. No apparent compressive canal or foraminal stenosis.  C7-T1: Normal interspace.  IMPRESSION: No spinal cause of the left-sided symptoms is identified. Ordinary mild spondylosis at C4-5, C5-6 and C6-7 but no evidence of compressive canal or foraminal stenosis.   Electronically Signed By: Paulina FusiMark  Shogry M.D. On: 07/07/2018 10:59  Cervical Discogram views: No results found for this  or any previous visit.   Shoulder Imaging: Shoulder-R MR w contrast: No results found for this or any previous visit.  Shoulder-L MR w contrast: No results found for this or any previous visit.  Shoulder-R MR w/wo contrast: No results found for this or any previous visit.  Shoulder-L MR w/wo contrast: No results found for this or any previous visit.  Shoulder-R MR wo contrast: No results found for this or any previous visit.  Shoulder-L MR wo contrast: No results found for this or any previous visit.  Shoulder-R CT w contrast: No results found for this or any previous visit.  Shoulder-L CT w contrast: No results found for this or any previous visit.  Shoulder-R CT w/wo contrast: No results found for this or any previous visit.  Shoulder-L CT w/wo contrast: No results found for this or any previous visit.  Shoulder-R CT wo contrast: No results found for this or any previous visit.  Shoulder-L CT wo contrast: No results found for this or any previous visit.  Shoulder-R DG Arthrogram: No results found for this or any previous visit.  Shoulder-L DG Arthrogram: No results found for this or any previous visit.  Shoulder-R DG 1 view: No results found for this or any previous visit.  Shoulder-L DG 1 view: No results found for this or any previous visit.  Shoulder-R DG: No results found for this or any previous visit.  Shoulder-L DG: No results found for this or any previous visit.   Thoracic Imaging: Thoracic MR wo contrast: No results found for this or any previous visit.  Thoracic MR wo contrast: No valid procedures specified. Thoracic MR w/wo contrast: No results found for this or any previous visit.  Thoracic MR w contrast: No results found for this or any previous visit.  Thoracic CT wo contrast: No results found for this or any previous visit.  Thoracic CT w/wo contrast: No results found for this or any previous visit.  Thoracic CT w/wo contrast: No results found  for this or any previous visit.  Thoracic CT w contrast: No results found for this or any previous visit.  Thoracic DG 2-3 views: No results found for this or any previous visit.  Thoracic DG 4 views: No results found for this or any previous visit.  Thoracic DG: Results for orders placed during the hospital encounter of 10/17/09  DG Thoracolumabar Spine  Narrative Clinical Data: Spinal cord stimulator placement  THORACOLUMBAR SPINE - 2 VIEW  Comparison: None  Findings: Initial film shows a needle transversely overlying the pedicle level of L1.  The second film shows a probe at the inferior lamina of T12.  The third film shows neurostimulator placement overlying the midline from mid T11 to mid T12.  IMPRESSION: Lower thoracic neurostimulator placement.  Provider: Roderick Pee  Thoracic DG w/swimmers view: No results found for this or any previous visit.  Thoracic DG Myelogram views: No results found for this or any previous visit.  Thoracic DG Myelogram views: No results found for this or any previous visit.   Lumbosacral Imaging: Lumbar MR wo contrast: No results found for this or any previous visit.  Lumbar MR wo contrast: No valid procedures specified. Lumbar MR w/wo contrast: Results for orders placed during the hospital encounter of 09/21/08  MR Lumbar Spine W Wo Contrast  Narrative Clinical Data: Back pain.  Right leg pain.  Previous surgery.  MRI LUMBAR SPINE WITHOUT AND WITH CONTRAST  Technique:  Multiplanar and multiecho pulse sequences of the lumbar spine were obtained without and with intravenous contrast.  Contrast: 20 ml Multihance  Comparison: 11/24/2006  Findings: T12-L1:  Bulging of the disc without compressive stenosis.  L1-2:  Normal interspace.  L2-3:  Minimal disc bulge.  Minimal facet prominence.  No significant narrowing of the canal or foramina.  L3, S1:  Previous posterior decompression and fusion.  The spinal canal and  foramina appear widely patent.  No paravertebral pathology evident.  IMPRESSION: Stable examination since 2008.  Good appearance in the fusion segment from L3 to the sacrum.  Mild disc bulge and facet degeneration at L2-3 without apparent stenosis.  No progressive change since the previous study.  Provider: Frederico Hamman  Lumbar MR w/wo contrast: No results found for this or any previous visit.  Lumbar MR w contrast: No results found for this or any previous visit.  Lumbar CT wo contrast: No results found for this or any previous visit.  Lumbar CT w/wo contrast: No results found for this or any previous visit.  Lumbar CT w/wo  contrast: No results found for this or any previous visit.  Lumbar CT w contrast: Results for orders placed during the hospital encounter of 02/14/05  CT Lumbar Spine W Contrast  Narrative Clinical Data:  The patient has back pain in the lumbosacral region that extends into the right buttocks region.  He is referred for diagnostic disc injection. LUMBAR DISKOGRAM: The patient received 1g IV Ancef prior to the procedure.  3cc of Ancef was placed in the contrast vial utilized for the procedure.  Left posterolateral approach was utilized.  22 gauge Chiba needle was placed into the L2-3, L3-4, and L4-5 discs from a left posterolateral approach.  Contrast injected utilizing pressure manometry. L2-3:  Opening pressure 20.  We attain a pressure of 120.  Firm end-point was elicited.  There is no pain response noted. L3-4:  Opening pressure 10.  At a pressure of 15, we provoke intense pain he scores as 8 to 9 out of 10, identical in location to the pain he has on a regular bases.  Posterior annular tear is noted. L4-5:  Opening pressure 5.  At a pressure of 10, we provoke an intense pain response he scores as 10 out of 10.  He states that this is identical in location to that which he has on a regular bases.  Posterior annular tear is noted.  Impression 1.  Provocative  diskography was performed on the L2-3, L3-4, and L4-5 discs.  The L2-3 level was utilized as a control with no pain response elicited and the disc is morphologically normal. 2.  Injection of the L3-4 and L4-5 discs each provoke a concordant pain response.  The discs are chemically sensitive. CT LUMBAR SPINE W/CONTRAST: Axial imaging was performed through the disc level.  This was supplemented with coronal and sagittal reformatted images. L2-3:  Contrast is seen centrally through the nucleus.  Contrast extends posteriorly into the outer annular fibers without a definite annular tear.  There is no disc protrusion noted. L3-4:  Left paracentral disc protrusion with broad annular tear centrally.  No encroachment upon the exiting nerve root. L4-5:  Broad disc protrusion with annular tear at the 6:00 to 5:30 position.  No encroachment upon the dorsal ganglia. L5-S1:  Disc bulge is appreciated, narrowing the inferior foramina without encroachment upon the dorsal root ganglia. IMPRESSION: Disc protrusions L3-4 and L4-5 with annular tears as noted above.  Provider: Sheilah Mins  Lumbar DG 1V: Results for orders placed in visit on 01/20/02  DG Lumbar Spine 1 View  Narrative FINDINGS CLINICAL DATA:  DISCECTOMY FOR LUMBAR DISC HERNIATION @ L4-5. TWO PORTABLE SPINE FILMS  01/20/02 PORTABLE SPINE #1 (2:10 P.M.) NEEDLE HAS BEEN PLACED POSTERIORLY, WHICH LIES BETWEEN THE SPINOUS PROCESSES OF L-4 AND L-5. IMPRESSION POSTERIOR LOCALIZATION AT L4-5. PORTABLE SPINE #2 (2:25 P.M.) CLAMPS HAVE NOW BEEN ADVANCED AND LIE OPPOSITE THE SPINOUS PROCESSES OF BOTH L-4 AND L-5. IMPRESSION POSTERIOR SPINOUS LOCALIZATION AT L-4 AND L-5.  Lumbar DG 1V (Clearing): No results found for this or any previous visit.  Lumbar DG 2-3V (Clearing): No results found for this or any previous visit.  Lumbar DG 2-3 views: Results for orders placed during the hospital encounter of 09/21/08  DG Lumbar Spine 2-3  Views  Narrative Clinical Data: Lumbar spine fusion in 2007, low back and right leg pain now  LUMBAR SPINE - 2-3 VIEW  Comparison: MRI of the lumbar spine of 11/24/2006  Findings: Posterior fusion is again noted at L3-4 , L4-5 and L5 S1 levels.  Ray cages are present for fusion at L3-4, L4-5, and L5 S1 levels.  There is fracture of the right S1 screw noted.  There is no change in the slightly straightened alignment.  The SI joints appear normal.  IMPRESSION: 1.  Fracture of the right S1 fixation screw. 2.  No other change in posterior fusion from L3 S1.  Provider: Romie Minus  Lumbar DG (Complete) 4+V: Results for orders placed in visit on 09/03/02  DG Lumbar Spine Complete  Narrative FINDINGS CLINICAL DATA:  LOW BACK PAIN RADIATION INTO BOTH BUTTOCKS. LUMBAR SPINE (FOUR VIEWS) THERE ARE FIVE LUMBAR TYPE VERTEBRAL BODIES SHOWING A GENERAL CURVATURE CONVEX TO THE LEFT.  THERE IS MILD DISC SPACE NARROWING AT L3-4 AND AT L4-5.  THE PATIENT HAS HAD PARTIAL LAMINECTOMY AT L4-5. THERE ARE MILD FACET DEGENERATIVE CHANGES AT L4-5 AND L5-S1.  NO PARS DEFECT OR SLIPPAGE. IMPRESSION 1.  DISC SPACE NARROWING AT L3-4 AND L4-5 WITH POSTOPERATIVE CHANGES AT L4-5.  MILD LOWER LUMBAR FACET DEGENERATIVE CHANGES.        Lumbar DG F/E views: No results found for this or any previous visit.        Lumbar DG Bending views: No results found for this or any previous visit.        Lumbar DG Myelogram views: Results for orders placed in visit on 12/22/01  DG Myelogram Lumbar  Narrative FINDINGS CLINICAL DATA:  LOW BACK PAIN.  RIGHT LOWER EXTREMITY PAIN. LUMBAR MYELOGRAM CT LUMBAR SPINE WITH INTRATHECAL CONTRAST TECHNIQUE: LUMBAR REGION PREPPED WITH BETADINE, DRAPED IN THE USUAL STERILE FASHION, AND INFILTRATED LOCALLY WITH BUFFERED LIDOCAINE.  CURVED 22 GAUGE SPINAL NEEDLE ADVANCED INTO THE THECAL SAC AT L2-3 USING A RIGHT PARASAGITTAL APPROACH.  ONCE CLEAR COLORLESS CSF RETURNED, 17  ML OF OMNIPAQUE 180 ADMINISTERED INTRATHECALLY FOR LUMBAR MYELOGRAPHY, FOLLOWED BY AXIAL CT SCANNING OF THE LUMBAR SPINE.  NO IMMEDIATE COMPLICATION. FINDINGS: FIVE NON-RIB BEARING LUMBAR SEGMENTS.  NORMAL ALIGNMENT. L1-2:  SMALL CENTRAL ENDPLATE INVAGINATIONS.  NO POSTERIOR DISK PATHOLOGY.  NORMAL CONUS BEHIND THE L1 VERTEBRAL BODY. 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  NO SIGNIFICANT FORAMINAL STENOSIS OR SPINAL STENOSIS. L3-4:  MILD BROAD POSTERIOR DISK BULGE SLIGHTLY GREATER ON THE LEFT THAN RIGHT WHICH MAY MINIMALLY DISPLACE THE L4 NERVE ROOTS JUST ABOVE THE LATERAL RECESSES.  THERE IS MINIMAL ENCROACHMENT UPON THE NEURAL FORAMINA AND NO SIGNIFICANT SPINAL STENOSIS. L4-5:  BROAD DISK BULGE WITH FOCAL LEFT PARACENTRAL PROTRUSION EXTENDING SLIGHTLY BEHIND THE L5 VERTEBRAL BODY.  THIS DOES EXTEND INTO THE NEURAL FORAMEN AS WELL, AND APPEARS TO DISPLACE POSTERIORLY THE LEFT L5 NERVE ROOT.  THERE IS NO SPINAL STENOSIS. L5-S1:  MILD CIRCUMFERENTIAL DISK BULGE.  SCHMORL'S NODE IN THE INFERIOR L5 ENDPLATE.  NO FORAMINAL COMPROMISE OR SPINAL STENOSIS. IMPRESSION 1)   ASYMMETRIC DISK BULGE L3-4. 2)   LEFT PARACENTRAL PROTRUSION L4-5 WHICH MAY AFFECT THE LEFT L5 NERVE ROOT. 3)  MINIMAL DISK BULGE L5-S1.  Lumbar DG Myelogram: Results for orders placed during the hospital encounter of 07/07/18  DG MYELOGRAPHY LUMBAR INJ CERVICAL  Narrative CLINICAL DATA:  Left arm pain, numbness and weakness.  FLUOROSCOPY TIME:  1 minutes 32 seconds. 308.51 micro gray meter squared  PROCEDURE: LUMBAR PUNCTURE FOR CERVICAL MYELOGRAM  After thorough discussion of risks and benefits of the procedure including bleeding, infection, injury to nerves, blood vessels, adjacent structures as well as headache and CSF leak, written and oral informed consent was obtained. Consent was obtained by Dr. Paulina Fusi. We discussed the  high likelihood of obtaining a diagnostic study.  Patient was  positioned prone on the fluoroscopy table. Local anesthesia was provided with 1% lidocaine without epinephrine after prepped and draped in the usual sterile fashion. Puncture was performed at L2-3 using a 5 inch 22-gauge spinal needle via right paramedian approach. Using a single pass through the dura, the needle was placed within the thecal sac, with return of clear CSF. 10 mL of Isovue M-300 was injected into the thecal sac, with normal opacification of the nerve roots and cauda equina consistent with free flow within the subarachnoid space. The patient was then moved to the trendelenburg position and contrast flowed into the Cervical spine region.  I personally performed the lumbar puncture and administered the intrathecal contrast. I also personally performed acquisition of the myelogram images.  TECHNIQUE: Contiguous axial images were obtained through the Cervical spine after the intrathecal infusion of infusion. Coronal and sagittal reconstructions were obtained of the axial image sets. An additional set of axial imaging was performed through the mid and lower cervical spine using increased technique, due to the patient's size.  FINDINGS: CERVICAL MYELOGRAM FINDINGS:  Small anterior extradural defects at C4-5, C5-6 and C6-7. No discernible nerve root compression at myelography. See results of CT. No central canal stenosis.  CT CERVICAL MYELOGRAM FINDINGS:  Normal alignment. No focal bone lesion. No abnormality at the foramen magnum, C1-2, C2-3, or C3-4.  C4-5: Small endplate osteophytes and disc bulge. No canal or foraminal stenosis.  C5-6: Small endplate osteophytes. No apparent compressive canal or foraminal stenosis.  C6-7: Small endplate osteophytes. No apparent compressive canal or foraminal stenosis.  C7-T1: Normal interspace.  IMPRESSION: No spinal cause of the left-sided symptoms is identified. Ordinary mild spondylosis at C4-5, C5-6 and C6-7 but no  evidence of compressive canal or foraminal stenosis.   Electronically Signed By: Paulina Fusi M.D. On: 07/07/2018 10:59  Lumbar DG Myelogram: No results found for this or any previous visit.  Lumbar DG Myelogram: No results found for this or any previous visit.  Lumbar DG Myelogram Lumbosacral: No results found for this or any previous visit.  Lumbar DG Diskogram views: Results for orders placed during the hospital encounter of 02/14/05  DG Diskogram Lumbar  Narrative Clinical Data:  The patient has back pain in the lumbosacral region that extends into the right buttocks region.  He is referred for diagnostic disc injection. LUMBAR DISKOGRAM: The patient received 1g IV Ancef prior to the procedure.  3cc of Ancef was placed in the contrast vial utilized for the procedure.  Left posterolateral approach was utilized.  22 gauge Chiba needle was placed into the L2-3, L3-4, and L4-5 discs from a left posterolateral approach.  Contrast injected utilizing pressure manometry. L2-3:  Opening pressure 20.  We attain a pressure of 120.  Firm end-point was elicited.  There is no pain response noted. L3-4:  Opening pressure 10.  At a pressure of 15, we provoke intense pain he scores as 8 to 9 out of 10, identical in location to the pain he has on a regular bases.  Posterior annular tear is noted. L4-5:  Opening pressure 5.  At a pressure of 10, we provoke an intense pain response he scores as 10 out of 10.  He states that this is identical in location to that which he has on a regular bases.  Posterior annular tear is noted.  Impression 1.  Provocative diskography was performed on the L2-3, L3-4, and L4-5 discs.  The L2-3  level was utilized as a control with no pain response elicited and the disc is morphologically normal. 2.  Injection of the L3-4 and L4-5 discs each provoke a concordant pain response.  The discs are chemically sensitive. CT LUMBAR SPINE W/CONTRAST: Axial imaging was performed  through the disc level.  This was supplemented with coronal and sagittal reformatted images. L2-3:  Contrast is seen centrally through the nucleus.  Contrast extends posteriorly into the outer annular fibers without a definite annular tear.  There is no disc protrusion noted. L3-4:  Left paracentral disc protrusion with broad annular tear centrally.  No encroachment upon the exiting nerve root. L4-5:  Broad disc protrusion with annular tear at the 6:00 to 5:30 position.  No encroachment upon the dorsal ganglia. L5-S1:  Disc bulge is appreciated, narrowing the inferior foramina without encroachment upon the dorsal root ganglia. IMPRESSION: Disc protrusions L3-4 and L4-5 with annular tears as noted above.  Provider: Delma Officerhonda Yancey, Romie MinusKimberly Maynard  Lumbar DG Diskogram views: No results found for this or any previous visit.  Lumbar DG Epidurogram OP: Results for orders placed during the hospital encounter of 07/12/10  DG Epidurography  Narrative *RADIOLOGY REPORT*  CLINICAL DATA:  Lumbosacral spondylosis without myelopathy.  Status post fusion at L3-4, L4-5, and L5-S1.  Displacement of the disc at L2-3.  Right lower extremity radiculitis.  LUMBAR EPIDURAL INJECTION: An interlaminar approach was performed on the right at L2-3.  The overlying skin was cleansed and anesthetized.  A 20 gauge spinal needle was advanced using loss-of-resistance technique.  Injection of 2cc of Omnipaque 180 confirmed epidural placement.  There was no evidence for intravascular or intrathecal spread of contrast.  I then injected 120 mg of Depo-Medrol and 3ml of 1% lidocaine.  The patient tolerated the procedure without evidence for complication. The patient  was observed for 20 minutes prior to discharge in stable neurologic condition.  FLUORO TIME:  17 seconds  IMPRESSIONS:  Technically successful first interlaminar epidural steroid injection on the right at L2-3.  Original Report Authenticated By:  Jamesetta OrleansHRISTOPHER W. MATTERN, M.D.  Lumbar DG Epidurogram IP: No valid procedures specified.  Sacroiliac Joint Imaging: Sacroiliac Joint DG: No results found for this or any previous visit.  Sacroiliac Joint MR w/wo contrast: No results found for this or any previous visit.  Sacroiliac Joint MR wo contrast: No results found for this or any previous visit.   Spine Imaging: Whole Spine DG Myelogram views: No results found for this or any previous visit.  Whole Spine MR Mets screen: No results found for this or any previous visit.  Whole Spine MR Mets screen: No results found for this or any previous visit.  Whole Spine MR w/wo: No results found for this or any previous visit.  MRA Spinal Canal w/ cm: No results found for this or any previous visit.  MRA Spinal Canal wo/ cm: No valid procedures specified. MRA Spinal Canal w/wo cm: No results found for this or any previous visit.  Spine Outside MR Films: No results found for this or any previous visit.  Spine Outside CT Films: No results found for this or any previous visit.  CT-Guided Biopsy: No results found for this or any previous visit.  CT-Guided Needle Placement: No results found for this or any previous visit.  DG Spine outside: No results found for this or any previous visit.  IR Spine outside: No results found for this or any previous visit.  NM Spine outside: No results found for this or  any previous visit.   Hip Imaging: Hip-R MR w contrast: No results found for this or any previous visit.  Hip-L MR w contrast: No results found for this or any previous visit.  Hip-R MR w/wo contrast: No results found for this or any previous visit.  Hip-L MR w/wo contrast: No results found for this or any previous visit.  Hip-R MR wo contrast: No results found for this or any previous visit.  Hip-L MR wo contrast: No results found for this or any previous visit.  Hip-R CT w contrast: No results found for this or any previous  visit.  Hip-L CT w contrast: No results found for this or any previous visit.  Hip-R CT w/wo contrast: No results found for this or any previous visit.  Hip-L CT w/wo contrast: No results found for this or any previous visit.  Hip-R CT wo contrast: No results found for this or any previous visit.  Hip-L CT wo contrast: No results found for this or any previous visit.  Hip-R DG 2-3 views: No results found for this or any previous visit.  Hip-L DG 2-3 views: No results found for this or any previous visit.  Hip-R DG Arthrogram: No results found for this or any previous visit.  Hip-L DG Arthrogram: No results found for this or any previous visit.  Hip-B DG Bilateral: No results found for this or any previous visit.   Knee Imaging: Knee-R MR w contrast: No results found for this or any previous visit.  Knee-L MR w/o contrast: No results found for this or any previous visit.  Knee-R MR w/wo contrast: No results found for this or any previous visit.  Knee-L MR w/wo contrast: No results found for this or any previous visit.  Knee-R MR wo contrast: No results found for this or any previous visit.  Knee-L MR wo contrast: No results found for this or any previous visit.  Knee-R CT w contrast: No results found for this or any previous visit.  Knee-L CT w contrast: No results found for this or any previous visit.  Knee-R CT w/wo contrast: No results found for this or any previous visit.  Knee-L CT w/wo contrast: No results found for this or any previous visit.  Knee-R CT wo contrast: No results found for this or any previous visit.  Knee-L CT wo contrast: No results found for this or any previous visit.  Knee-R DG 1-2 views: No results found for this or any previous visit.  Knee-L DG 1-2 views: No results found for this or any previous visit.  Knee-R DG 3 views: No results found for this or any previous visit.  Knee-L DG 3 views: No results found for this or any previous  visit.  Knee-R DG 4 views: No results found for this or any previous visit.  Knee-L DG 4 views: No results found for this or any previous visit.  Knee-R DG Arthrogram: No results found for this or any previous visit.  Knee-L DG Arthrogram: No results found for this or any previous visit.   Ankle Imaging: Ankle-R DG Complete: No results found for this or any previous visit.  Ankle-L DG Complete: No results found for this or any previous visit.   Foot Imaging: Foot-R DG Complete: Results for orders placed during the hospital encounter of 05/30/21  DG Foot Complete Right  Narrative CLINICAL DATA:  pain, concern for infection  EXAM: RIGHT FOOT COMPLETE - 3+ VIEW  COMPARISON:  None.  FINDINGS: No evidence  of fracture, dislocation, or joint effusion. No cortical erosion or destruction. No evidence of severe arthropathy. No aggressive appearing focal bone abnormality. Diffuse dorsal subcutaneus soft tissue edema. 7mm thin linear density along the plantar aspect of the midfoot may represent a retained radiopaque foreign body.  IMPRESSION: 1. No radiographic finding to suggest osteomyelitis. 2. No acute displaced fracture or dislocation. 3.  Diffuse dorsal subcutaneus soft tissue edema. 4. 7mm thin linear density along the plantar aspect of the midfoot may represent a retained radiopaque foreign body.   Electronically Signed By: Tish FredericksonMorgane  Naveau M.D. On: 05/30/2021 17:05  Foot-L DG Complete: Results for orders placed in visit on 07/27/01  DG Foot Complete Left  Narrative FINDINGS CLINICAL DATA:    ORTHOPEDIC FIXATION OF NONUNION SUBTALAR JOINT. C-ARM THE PORTABLE IMAGE INTENSIFIER WAS UTILIZED FOR ORTHOPEDIC MANIPULATION. LEFT FOOT THREE DIGITAL IMAGES OF THE LEFT FOOT REVEAL TWO SCREWS TO HAVE BEEN INSERTED FROM THE INFERIOR DORSAL ASPECT OF THE CALCANEUS INTO THE TALUS FOR ARTHRODESIS OF THE SUBTALAR JOINT.  THE ALIGNMENT IS FELT TO BE SATISFACTORY WITH APPARENT  GOOD FIXATION. IMPRESSION ARTHRODESIS OF SUBTALAR JOINT WITH DISCUSSION AS ABOVE.   Elbow Imaging: Elbow-R DG Complete: No results found for this or any previous visit.  Elbow-L DG Complete: No results found for this or any previous visit.   Wrist Imaging: Wrist-R DG Complete: No results found for this or any previous visit.  Wrist-L DG Complete: No results found for this or any previous visit.   Hand Imaging: Hand-R DG Complete: No results found for this or any previous visit.  Hand-L DG Complete: No results found for this or any previous visit.   Complexity Note: Imaging results reviewed.                         ROS  Cardiovascular: {Hx; Cardiovascular History:210120525} Pulmonary or Respiratory: {Hx; Pumonary and/or Respiratory History:210120523} Neurological: {Hx; Neurological:210120504} Psychological-Psychiatric: {Hx; Psychological-Psychiatric History:210120512} Gastrointestinal: {Hx; Gastrointestinal:210120527} Genitourinary: {Hx; Genitourinary:210120506} Hematological: {Hx; Hematological:210120510} Endocrine: {Hx; Endocrine history:210120509} Rheumatologic: {Hx; Rheumatological:210120530} Musculoskeletal: {Hx; Musculoskeletal:210120528} Work History: {Hx; Work history:210120514}  Allergies  Mr. Freida Busmanllen is allergic to morphine and related.  Laboratory Chemistry Profile   Renal Lab Results  Component Value Date   BUN 21 (H) 07/07/2021   CREATININE 0.93 07/07/2021   GFRAA >60 02/28/2018   GFRNONAA >60 07/07/2021   PROTEINUR NEGATIVE 05/30/2021     Electrolytes Lab Results  Component Value Date   NA 135 07/07/2021   K 4.2 07/07/2021   CL 95 (L) 07/07/2021   CALCIUM 10.1 07/07/2021   MG 2.2 05/31/2021   PHOS 4.2 02/23/2018     Hepatic Lab Results  Component Value Date   AST 30 07/07/2021   ALT 33 07/07/2021   ALBUMIN 4.3 07/07/2021   ALKPHOS 49 07/07/2021   LIPASE 41 12/09/2015     ID Lab Results  Component Value Date   HIV Non Reactive  05/31/2021   SARSCOV2NAA NEGATIVE 05/30/2021   STAPHAUREUS  10/12/2009    NEGATIVE        The Xpert SA Assay (FDA approved for NASAL specimens only), is one component of a comprehensive surveillance program.  It is not intended to diagnose infection nor to guide or monitor treatment.   MRSAPCR NEGATIVE 02/19/2018     Bone No results found for: "VD25OH", "VD125OH2TOT", "MW4132GM0VD3125OH2", "NU2725DG6VD2125OH2", "25OHVITD1", "25OHVITD2", "25OHVITD3", "TESTOFREE", "TESTOSTERONE"   Endocrine Lab Results  Component Value Date   GLUCOSE 117 (H) 07/07/2021   GLUCOSEU  500 (A) 05/30/2021   HGBA1C 6.3 (H) 05/31/2021   TSH 3.879 01/11/2017     Neuropathy Lab Results  Component Value Date   HGBA1C 6.3 (H) 05/31/2021   HIV Non Reactive 05/31/2021     CNS No results found for: "COLORCSF", "APPEARCSF", "RBCCOUNTCSF", "WBCCSF", "POLYSCSF", "LYMPHSCSF", "EOSCSF", "PROTEINCSF", "GLUCCSF", "JCVIRUS", "CSFOLI", "IGGCSF", "LABACHR", "ACETBL"   Inflammation (CRP: Acute  ESR: Chronic) Lab Results  Component Value Date   CRP 5.3 (H) 05/31/2021   ESRSEDRATE 44 (H) 05/31/2021   LATICACIDVEN 2.3 (HH) 07/07/2021     Rheumatology Lab Results  Component Value Date   ANA Negative 02/19/2018     Coagulation Lab Results  Component Value Date   INR 1.1 07/04/2021   LABPROT 13.9 07/04/2021   APTT 29 10/12/2009   PLT 185 07/07/2021   DDIMER 1.27 (H) 05/31/2021     Cardiovascular Lab Results  Component Value Date   BNP 6.1 05/30/2021   TROPONINI 0.08 (HH) 02/20/2018   HGB 14.2 07/07/2021   HCT 43.1 07/07/2021     Screening Lab Results  Component Value Date   SARSCOV2NAA NEGATIVE 05/30/2021   STAPHAUREUS  10/12/2009    NEGATIVE        The Xpert SA Assay (FDA approved for NASAL specimens only), is one component of a comprehensive surveillance program.  It is not intended to diagnose infection nor to guide or monitor treatment.   MRSAPCR NEGATIVE 02/19/2018   HIV Non Reactive 05/31/2021      Cancer No results found for: "CEA", "CA125", "LABCA2"   Allergens No results found for: "ALMOND", "APPLE", "ASPARAGUS", "AVOCADO", "BANANA", "BARLEY", "BASIL", "BAYLEAF", "GREENBEAN", "LIMABEAN", "WHITEBEAN", "BEEFIGE", "REDBEET", "BLUEBERRY", "BROCCOLI", "CABBAGE", "MELON", "CARROT", "CASEIN", "CASHEWNUT", "CAULIFLOWER", "CELERY"     Note: Lab results reviewed.  PFSH  Drug: Mr. Freida Busmanllen  reports no history of drug use. Alcohol:  reports no history of alcohol use. Tobacco:  reports that he has quit smoking. His smokeless tobacco use includes chew. Medical:  has a past medical history of Diabetes mellitus without complication (HCC), Hypercholesteremia, Hypertension, and Pancreatitis. Family: family history includes Diabetes in his mother and sister; Heart disease in his mother.  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 In no apparent acute distress There were no vitals filed for this visit. BMI Assessment: Estimated body mass index  is 40.25 kg/m as calculated from the following:   Height as of 07/07/21: 6\' 4"  (1.93 m).   Weight as of 07/07/21: 330 lb 11 oz (150 kg).  BMI interpretation table: BMI level Category Range association with higher incidence of  chronic pain  <18 kg/m2 Underweight   18.5-24.9 kg/m2 Ideal body weight   25-29.9 kg/m2 Overweight Increased incidence by 20%  30-34.9 kg/m2 Obese (Class I) Increased incidence by 68%  35-39.9 kg/m2 Severe obesity (Class II) Increased incidence by 136%  >40 kg/m2 Extreme obesity (Class III) Increased incidence by 254%   Patient's current BMI Ideal Body weight  There is no height or weight on file to calculate BMI. Patient weight not recorded   BMI Readings from Last 4 Encounters:  07/07/21 40.25 kg/m  07/04/21 38.79 kg/m  05/30/21 42.60 kg/m  02/28/18 35.68 kg/m   Wt Readings from Last 4 Encounters:  07/07/21 (!) 330 lb 11 oz (150 kg)  07/04/21 (!) 325 lb (147.4 kg)  05/30/21 (!) 350 lb (158.8 kg)  02/28/18 293 lb 1.6 oz (132.9 kg)    Psych/Mental status: Alert, oriented x 3 (person, place, & time)       Eyes: PERLA Respiratory: No evidence of acute respiratory distress  Assessment  Primary Diagnosis & Pertinent Problem List: There were no encounter diagnoses.  Visit Diagnosis (New problems to examiner): No diagnosis found. Plan of Care (Initial workup plan)  Note: Mr. Hallowell was reminded that as per protocol, today's visit has been an evaluation only. We have not taken over the patient's controlled substance management.  Problem-specific plan: No problem-specific Assessment & Plan notes found for this encounter.  Lab Orders  No laboratory test(s) ordered today   Imaging Orders  No imaging studies ordered today   Referral Orders  No referral(s) requested today   Procedure Orders    No procedure(s) ordered today   Pharmacotherapy (current): Medications ordered:  No orders of the defined types were placed in this  encounter.  Medications administered during this visit: Karsen C. Wire had no medications administered during this visit.   Analgesic Pharmacotherapy:  Opioid Analgesics: For patients currently taking or requesting to take opioid analgesics, in accordance with [place], we Martin assess their risks and indications for the use of these substances. After completing our evaluation, we may offer recommendations, but we no longer take patients for medication management. The prescribing physician Martin ultimately decide, based on his/her training and level of comfort whether to adopt any of the recommendations, including whether or not to prescribe such medicines.  Membrane stabilizer: To be determined at a later time  Muscle relaxant: To be determined at a later time  NSAID: To be determined at a later time  Other analgesic(s): To be determined at a later time   Interventional management options: Mr. Dolman was informed that there is no guarantee that he would be a candidate for interventional therapies. The decision Martin be based on the results of diagnostic studies, as well as Mr. Buffin risk profile.  Procedure(s) under consideration:  Pending results of ordered studies      Interventional Therapies  Risk Factors  Considerations:     Planned  Pending:   See above for possible orders   Under consideration:   Pending completion of evaluation   Completed:   None at this time   Completed by other providers:   None at this time   Therapeutic  Palliative (PRN) options:   None established      Provider-requested follow-up: No follow-ups on file.  Future Appointments  Date Time Provider [place]  07/07/2022 11:00 AM Milinda Pointer, MD [facility] None    Duration of encounter: *** minutes.  Total time on encounter, as per [facility]  guidelines included both the face-to-face and non-face-to-face time personally spent by the physician and/or other  qualified health care professional(s) on the day of the encounter (includes time in activities that require the physician or other qualified health care professional and does not include time in activities normally performed by clinical staff). Physician's time may include the following activities when performed: Preparing to see the patient (e.g., pre-charting review of records, searching for previously ordered imaging, lab work, and nerve conduction tests) Review of prior analgesic pharmacotherapies. Reviewing PMP Interpreting ordered tests (e.g., lab work, imaging, nerve conduction tests) Performing post-procedure evaluations, including interpretation of diagnostic procedures Obtaining and/or reviewing separately obtained history Performing a medically appropriate examination and/or evaluation Counseling and educating the patient/family/caregiver Ordering medications, tests, or procedures Referring and communicating with other health care professionals (when not separately reported) Documenting clinical information in the electronic or other health record Independently interpreting results (not separately reported) and communicating results to the patient/ family/caregiver Care coordination (not separately reported)  Note by: Oswaldo Done, MD Date: 07/07/2022; Time: 6:08 PM

## 2022-07-07 ENCOUNTER — Ambulatory Visit (HOSPITAL_BASED_OUTPATIENT_CLINIC_OR_DEPARTMENT_OTHER): Payer: Medicare Other | Admitting: Pain Medicine

## 2022-07-07 DIAGNOSIS — Z91199 Patient's noncompliance with other medical treatment and regimen due to unspecified reason: Secondary | ICD-10-CM | POA: Insufficient documentation

## 2023-06-24 ENCOUNTER — Encounter: Payer: Medicare Other | Attending: Physician Assistant | Admitting: Physician Assistant

## 2023-06-24 DIAGNOSIS — I890 Lymphedema, not elsewhere classified: Secondary | ICD-10-CM | POA: Diagnosis not present

## 2023-06-24 DIAGNOSIS — L97322 Non-pressure chronic ulcer of left ankle with fat layer exposed: Secondary | ICD-10-CM | POA: Insufficient documentation

## 2023-06-24 DIAGNOSIS — I10 Essential (primary) hypertension: Secondary | ICD-10-CM | POA: Insufficient documentation

## 2023-06-24 DIAGNOSIS — L97812 Non-pressure chronic ulcer of other part of right lower leg with fat layer exposed: Secondary | ICD-10-CM | POA: Insufficient documentation

## 2023-06-24 DIAGNOSIS — E11621 Type 2 diabetes mellitus with foot ulcer: Secondary | ICD-10-CM | POA: Diagnosis present

## 2023-06-24 DIAGNOSIS — Z87891 Personal history of nicotine dependence: Secondary | ICD-10-CM | POA: Diagnosis not present

## 2023-06-24 DIAGNOSIS — L97312 Non-pressure chronic ulcer of right ankle with fat layer exposed: Secondary | ICD-10-CM | POA: Insufficient documentation

## 2023-06-24 DIAGNOSIS — L97412 Non-pressure chronic ulcer of right heel and midfoot with fat layer exposed: Secondary | ICD-10-CM | POA: Insufficient documentation

## 2023-06-24 DIAGNOSIS — L97522 Non-pressure chronic ulcer of other part of left foot with fat layer exposed: Secondary | ICD-10-CM | POA: Insufficient documentation

## 2023-06-24 DIAGNOSIS — I872 Venous insufficiency (chronic) (peripheral): Secondary | ICD-10-CM | POA: Insufficient documentation

## 2023-06-26 NOTE — Progress Notes (Signed)
NIM, GELWICKS ([id number]) [id number] Page 1 of 19 Visit Report for 06/24/2023 Allergy List Details Patient Name: Date of Service: Martin Riddle MES C. 06/24/2023 8:45 A M Medical Record Number: [id number] Patient Account Number: [id number] Date of Birth/Sex: Treating RN: 1971-01-17 (53 y.o. Judie Petit) [name] Primary Care Mabelle Mungin: Marletta Lor Other Clinician: Referring Marguriete Wootan: Treating Shoni Quijas/Extender: Flori Derry Self, Referral Weeks in Treatment: 0 Allergies Active Allergies morphine Allergy Notes Electronic Signature(s) Signed: 06/26/2023 8:18:57 AM By: [name] RN Entered By: [name] on 06/24/2023 08:59:32 -------------------------------------------------------------------------------- Arrival Information Details Patient Name: Date of Service: Martin Riddle MES C. 06/24/2023 8:45 A M Medical Record Number: [id number] Patient Account Number: [id number] Date of Birth/Sex: Treating RN: 1970/07/09 (52 y.o. Judie Petit) [name] Primary Care Ethen Bannan: Marletta Lor Other Clinician: Referring Francys Bolin: Treating Isaah Furry/Extender: Vida Derry Self, Referral Weeks in Treatment: 0 Visit Information Patient Arrived: Ambulatory Arrival Time: 08:48 Accompanied By: wife Transfer Assistance: None Patient Identification Verified: Yes Secondary Verification Process Completed: Yes Patient Requires Transmission-Based Precautions: No Patient Has Alerts: No History Since Last Visit Added or deleted any medications: No Any new allergies or adverse reactions: No Had a fall or experienced change in activities of daily living that may affect risk of falls: No Signs or symptoms of abuse/neglect since last visito No Hospitalized since last visit: No Implantable device outside of the clinic excluding cellular tissue based products placed in the center since last visit: No Pain Present Now: No Electronic Signature(s) GENNARO, SHANAFELT ([id number])  [id number] Page 2 of 19 Signed: 06/26/2023 8:18:57 AM By: [name] RN Entered By: [name] on 06/24/2023 08:58:42 -------------------------------------------------------------------------------- Clinic Level of Care Assessment Details Patient Name: Date of Service: Martin Riddle MES C. 06/24/2023 8:45 A M Medical Record Number: [id number] Patient Account Number: [id number] Date of Birth/Sex: Treating RN: 12-26-1970 (52 y.o. Judie Petit) [name] Primary Care Teara Duerksen: Marletta Lor Other Clinician: Referring Dorrance Sellick: Treating Alassane Kalafut/Extender: Hambly Derry Self, Referral Weeks in Treatment: 0 Clinic Level of Care Assessment Items TOOL 2 Quantity Score X- 1 0 Use when only an EandM is performed on the INITIAL visit ASSESSMENTS - Nursing Assessment / Reassessment X- 1 20 General Physical Exam (combine w/ comprehensive assessment (listed just below) when performed on new pt. evals) X- 1 25 Comprehensive Assessment (HX, ROS, Risk Assessments, Wounds Hx, etc.) ASSESSMENTS - Wound and Skin A ssessment / Reassessment []  - 0 Simple Wound Assessment / Reassessment - one wound X- 7 5 Complex Wound Assessment / Reassessment - multiple wounds []  - 0 Dermatologic / Skin Assessment (not related to wound area) ASSESSMENTS - Ostomy and/or Continence Assessment and Care []  - 0 Incontinence Assessment and Management []  - 0 Ostomy Care Assessment and Management (repouching, etc.) PROCESS - Coordination of Care []  - 0 Simple Patient / Family Education for ongoing care X- 1 20 Complex (extensive) Patient / Family Education for ongoing care []  - 0 Staff obtains Chiropractor, Records, T Results / Process Orders est []  - 0 Staff telephones HHA, Nursing Homes / Clarify orders / etc []  - 0 Routine Transfer to another Facility (non-emergent condition) []  - 0 Routine Hospital Admission (non-emergent condition) []  - 0 New Admissions / Manufacturing engineer / Ordering NPWT  Apligraf, etc. , []  - 0 Emergency Hospital Admission (emergent condition) X- 1 10 Simple Discharge Coordination []  - 0 Complex (extensive) Discharge Coordination PROCESS - Special Needs []  - 0 Pediatric / Minor Patient Management []  - 0 Isolation Patient Management []  - 0 Hearing / Language / Visual  special needs []  - 0 Assessment of Community assistance (transportation, D/C planning, etc.) []  - 0 Additional assistance / Altered mentation VAHN, HERNANDEZGARCI ([id number]) [id number] Page 3 of 19 []  - 0 Support Surface(s) Assessment (bed, cushion, seat, etc.) INTERVENTIONS - Wound Cleansing / Measurement X- 1 5 Wound Imaging (photographs - any number of wounds) []  - 0 Wound Tracing (instead of photographs) []  - 0 Simple Wound Measurement - one wound X- 7 5 Complex Wound Measurement - multiple wounds []  - 0 Simple Wound Cleansing - one wound X- 7 5 Complex Wound Cleansing - multiple wounds INTERVENTIONS - Wound Dressings X - Small Wound Dressing one or multiple wounds 7 10 []  - 0 Medium Wound Dressing one or multiple wounds []  - 0 Large Wound Dressing one or multiple wounds []  - 0 Application of Medications - injection INTERVENTIONS - Miscellaneous []  - 0 External ear exam []  - 0 Specimen Collection (cultures, biopsies, blood, body fluids, etc.) []  - 0 Specimen(s) / Culture(s) sent or taken to Lab for analysis []  - 0 Patient Transfer (multiple staff / Nurse, adult / Similar devices) []  - 0 Simple Staple / Suture removal (25 or less) []  - 0 Complex Staple / Suture removal (26 or more) []  - 0 Hypo / Hyperglycemic Management (close monitor of Blood Glucose) []  - 0 Ankle / Brachial Index (ABI) - do not check if billed separately Has the patient been seen at the hospital within the last three years: Yes Total Score: 255 Level Of Care: New/Established - Level 5 Electronic Signature(s) Signed: 06/26/2023 8:18:57 AM By: [name] RN Entered  By: [name] on 06/24/2023 12:35:56 -------------------------------------------------------------------------------- Encounter Discharge Information Details Patient Name: Date of Service: Martin Riddle MES C. 06/24/2023 8:45 A M Medical Record Number: [id number] Patient Account Number: [id number] Date of Birth/Sex: Treating RN: 1971/05/26 (53 y.o. [name] Primary Care Temara Lanum: Marletta Lor Other Clinician: Referring Briahnna Harries: Treating Kinzlie Harney/Extender: Nims Derry Self, Referral Weeks in Treatment: 0 Encounter Discharge Information Items Post Procedure Vitals Discharge Condition: Stable Temperature (F): 98.3 Ambulatory Status: Ambulatory Pulse (bpm): 79 Discharge Destination: Home Respiratory Rate (breaths/min): 18 Transportation: Private Auto Blood Pressure (mmHg): 152/85 Accompanied By: self Schedule Follow-up Appointment: Yes Clinical Summary of Care: MYAIRE, WING ([id number]) [id number] Page 4 of 19 Electronic Signature(s) Signed: 06/24/2023 12:37:19 PM By: [name] RN Entered By: [name] on 06/24/2023 12:37:19 -------------------------------------------------------------------------------- Lower Extremity Assessment Details Patient Name: Date of Service: Martin Riddle MES C. 06/24/2023 8:45 A M Medical Record Number: [id number] Patient Account Number: [id number] Date of Birth/Sex: Treating RN: 02-13-1971 (52 y.o. Judie Petit) [name] Primary Care Kennedi Lizardo: Marletta Lor Other Clinician: Referring Levar Fayson: Treating Radiah Lubinski/Extender: Deuser Derry Self, Referral Weeks in Treatment: 0 Edema Assessment Assessed: [Left: No] [Right: No] Edema: [Left: Yes] [Right: Yes] Calf Left: Right: Point of Measurement: 35 cm From Medial Instep 47 cm 49 cm Ankle Left: Right: Point of Measurement: 12 cm From Medial Instep 26 cm 27 cm Knee To Floor Left: Right: From Medial Instep 47 cm 47 cm Vascular Assessment Pulses: Dorsalis Pedis Palpable:  [Left:Yes] [Right:Yes] Extremity colors, hair growth, and conditions: Extremity Color: [Left:Hyperpigmented] [Right:Hyperpigmented] Hair Growth on Extremity: [Left:No] [Right:No] Temperature of Extremity: [Left:Warm] [Right:Warm] Capillary Refill: [Left:< 3 seconds] [Right:< 3 seconds] Dependent Rubor: [Left:No] [Right:No] Blanched when Elevated: [Left:No Yes] [Right:No Yes] Toe Nail Assessment Left: Right: Thick: Yes Yes Discolored: Yes Yes Deformed: Yes Yes Improper Length and Hygiene: Yes Yes Electronic Signature(s) Signed: 06/24/2023 9:31:25 AM By: [name] RN Entered By:  [name] on 06/24/2023 09:31:24 SHAFTER, KEEFOVER ([id number]) [id number] Page 5 of 19 -------------------------------------------------------------------------------- Multi Wound Chart Details Patient Name: Date of Service: Martin Riddle MES C. 06/24/2023 8:45 A M Medical Record Number: [id number] Patient Account Number: [id number] Date of Birth/Sex: Treating RN: 12/15/1970 (52 y.o. Judie Petit) [name] Primary Care Karianne Nogueira: Marletta Lor Other Clinician: Referring Amdrew Oboyle: Treating Daiton Cowles/Extender: Turney Derry Self, Referral Weeks in Treatment: 0 Vital Signs Height(in): 77 Pulse(bpm): 79 Weight(lbs): 360 Blood Pressure(mmHg): 152/85 Body Mass Index(BMI): 42.7 Temperature(F): 98.3 Respiratory Rate(breaths/min): 18 [10:Photos:] Left, Lateral Ankle Right, Lateral Ankle Right T Great oe Wound Location: Gradually Appeared Gradually Appeared Gradually Appeared Wounding Event: Diabetic Wound/Ulcer of the Lower Diabetic Wound/Ulcer of the Lower Diabetic Wound/Ulcer of the Lower Primary Etiology: Extremity Extremity Extremity Hypertension, Type II Diabetes Hypertension, Type II Diabetes Hypertension, Type II Diabetes Comorbid History: 11/08/2022 01/08/2023 11/08/2022 Date Acquired: 0 0 0 Weeks of Treatment: Open Open Open Wound Status: No No No Wound Recurrence: 1x2.5x0.2 5x8x0.4  1.4x1.5x0.2 Measurements L x W x D (cm) 1.963 31.416 1.649 A (cm) : rea 0.393 12.566 0.33 Volume (cm) : Grade 2 Grade 3 Grade 2 Classification: Large Large Large Exudate A mount: Serosanguineous Serosanguineous Serosanguineous Exudate Type: red, brown red, brown red, brown Exudate Color: Small (1-33%) None Present (0%) Small (1-33%) Granulation A mount: Pink N/A Red Granulation Quality: Large (67-100%) Large (67-100%) Large (67-100%) Necrotic A mount: Fat Layer (Subcutaneous Tissue): Yes Fascia: No Fat Layer (Subcutaneous Tissue): Yes Exposed Structures: Fascia: No Fat Layer (Subcutaneous Tissue): No Fascia: No Tendon: No Tendon: No Tendon: No Muscle: No Muscle: No Muscle: No Joint: No Joint: No Joint: No Bone: No Bone: No Bone: No None None None Epithelialization: Wound Number: 6 7 8  Photos: Right T Second oe Right, Medial Lower Leg Left T Great oe Wound Location: Gradually Appeared Gradually Appeared Gradually Appeared Wounding Event: SHIVIN, MCLARTY ([id number]) [phone].pdf Page 6 of 19 Diabetic Wound/Ulcer of the Lower Diabetic Wound/Ulcer of the Lower Diabetic Wound/Ulcer of the Lower Primary Etiology: Extremity Extremity Extremity Hypertension, Type II Diabetes Hypertension, Type II Diabetes Hypertension, Type II Diabetes Comorbid History: 11/08/2022 11/08/2022 11/08/2022 Date Acquired: 0 0 0 Weeks of Treatment: Open Open Open Wound Status: No No No Wound Recurrence: 0.5x1x0.2 3x3x0.1 2x3x0.2 Measurements L x W x D (cm) 0.393 7.069 4.712 A (cm) : rea 0.079 0.707 0.942 Volume (cm) : Grade 2 Grade 2 Grade 2 Classification: Large Medium Large Exudate A mount: Serosanguineous Serosanguineous Serosanguineous Exudate Type: red, brown red, brown red, brown Exudate Color: Small (1-33%) Large (67-100%) Small (1-33%) Granulation A mount: Red Red Pink Granulation Quality: Large (67-100%) None Present (0%) Large  (67-100%) Necrotic A mount: Fat Layer (Subcutaneous Tissue): Yes Fat Layer (Subcutaneous Tissue): Yes Fat Layer (Subcutaneous Tissue): Yes Exposed Structures: Fascia: No Fascia: No Fascia: No Tendon: No Tendon: No Tendon: No Muscle: No Muscle: No Muscle: No Joint: No Joint: No Joint: No Bone: No Bone: No Bone: No None None None Epithelialization: Wound Number: 9 N/A N/A Photos: N/A N/A Left, Dorsal Foot N/A N/A Wound Location: Gradually Appeared N/A N/A Wounding Event: Diabetic Wound/Ulcer of the Lower N/A N/A Primary Etiology: Extremity Hypertension, Type II Diabetes N/A N/A Comorbid History: 11/08/2022 N/A N/A Date Acquired: 0 N/A N/A Weeks of Treatment: Open N/A N/A Wound Status: No N/A N/A Wound Recurrence: 8x4x0.3 N/A N/A Measurements L x W x D (cm) 25.133 N/A N/A A (cm) : rea 7.54 N/A N/A Volume (cm) : Grade 2 N/A N/A Classification: Large N/A N/A  Exudate A mount: Serosanguineous N/A N/A Exudate Type: red, brown N/A N/A Exudate Color: Small (1-33%) N/A N/A Granulation A mount: Pink N/A N/A Granulation Quality: Large (67-100%) N/A N/A Necrotic A mount: Fat Layer (Subcutaneous Tissue): Yes N/A N/A Exposed Structures: Fascia: No Tendon: No Muscle: No Joint: No Bone: No None N/A N/A Epithelialization: Treatment Notes Electronic Signature(s) Signed: 06/24/2023 9:32:14 AM By: [name] RN Entered By: [name] on 06/24/2023 09:32:14 Olin Hauser ([id number]) [id number] Page 7 of 19 -------------------------------------------------------------------------------- Multi-Disciplinary Care Plan Details Patient Name: Date of Service: Martin Riddle MES C. 06/24/2023 8:45 A M Medical Record Number: [id number] Patient Account Number: [id number] Date of Birth/Sex: Treating RN: 09-Mar-1971 (53 y.o. Judie Petit) [name] Primary Care Arren Laminack: Marletta Lor Other Clinician: Referring Jenya Putz: Treating Tocarra Gassen/Extender: Blethen Derry Self, Referral Weeks in Treatment: 0 Active Inactive Necrotic Tissue Nursing Diagnoses: Knowledge deficit related to management of necrotic/devitalized tissue Goals: Necrotic/devitalized tissue will be minimized in the wound bed Date Initiated: 06/24/2023 Target Resolution Date: 07/25/2023 Goal Status: Active Interventions: Assess patient pain level pre-, during and post procedure and prior to discharge Notes: Wound/Skin Impairment Nursing Diagnoses: Knowledge deficit related to ulceration/compromised skin integrity Goals: Patient/caregiver will verbalize understanding of skin care regimen Date Initiated: 06/24/2023 Target Resolution Date: 07/25/2023 Goal Status: Active Ulcer/skin breakdown will have a volume reduction of 30% by week 4 Date Initiated: 06/24/2023 Target Resolution Date: 07/25/2023 Goal Status: Active Ulcer/skin breakdown will have a volume reduction of 50% by week 8 Date Initiated: 06/24/2023 Target Resolution Date: 08/22/2023 Goal Status: Active Ulcer/skin breakdown will have a volume reduction of 80% by week 12 Date Initiated: 06/24/2023 Target Resolution Date: 09/22/2023 Goal Status: Active Ulcer/skin breakdown will heal within 14 weeks Date Initiated: 06/24/2023 Target Resolution Date: 10/22/2023 Goal Status: Active Interventions: Assess patient/caregiver ability to obtain necessary supplies Assess patient/caregiver ability to perform ulcer/skin care regimen upon admission and as needed Assess ulceration(s) every visit Notes: Electronic Signature(s) Signed: 06/24/2023 9:34:20 AM By: [name] RN Entered By: [name] on 06/24/2023 09:34:20 Olin Hauser ([id number]) [phone] Page 8 of 19 -------------------------------------------------------------------------------- Pain Assessment Details Patient Name: Date of Service: Martin Riddle MES C. 06/24/2023 8:45 A M Medical Record Number: [id number] Patient Account Number:  [id number] Date of Birth/Sex: Treating RN: [birth year] (52 y.o. Judie Petit) [name] Primary Care Lakyn Alsteen: Marletta Lor Other Clinician: Referring Syliva Mee: Treating Aija Scarfo/Extender: Keller Derry Self, Referral Weeks in Treatment: 0 Active Problems Location of Pain Severity and Description of Pain Patient Has Paino No Site Locations Pain Management and Medication Current Pain Management: Electronic Signature(s) Signed: 06/26/2023 8:18:57 AM By: [name] RN Entered By: [name] on 06/24/2023 08:58:52 -------------------------------------------------------------------------------- Patient/Caregiver Education Details Patient Name: Date of Service: Martin Riddle MES C. 1/15/2025andnbsp8:45 A M Medical Record Number: [id number] Patient Account Number: [id number] Date of Birth/Gender: Treating RN: May 26, 1971 (52 y.o. [name] Primary Care Physician: Marletta Lor Other Clinician: Referring Physician: Treating Physician/Extender: Myles Derry Self, Referral Weeks in TreatmentIzora Gala LEAMAN, DIMEO ([id number]) [id number] Page 9 of 19 Education Assessment Education Provided To: Patient Education Topics Provided Wound/Skin Impairment: Handouts: Caring for Your Ulcer Methods: Explain/Verbal Responses: State content correctly Electronic Signature(s) Signed: 06/26/2023 8:18:57 AM By: [name] RN Entered By: [name] on 06/24/2023 09:34:48 -------------------------------------------------------------------------------- Wound Assessment Details Patient Name: Date of Service: Martin Riddle MES C. 06/24/2023 8:45 A M Medical Record Number: [id number] Patient Account Number: [id number] Date of Birth/Sex: Treating RN: 05/31/1971 (53 y.o. [name] Primary Care Treshawn Schryver: Marletta Lor Other Clinician:  Referring Jatinder Mcdonagh: Treating Hallis Meditz/Extender: Vanderhoof Derry Self, Referral Weeks in Treatment: 0 Wound Status Wound Number: 10 Primary Etiology: Diabetic  Wound/Ulcer of the Lower Extremity Wound Location: Left, Lateral Ankle Wound Status: Open Wounding Event: Gradually Appeared Comorbid History: Hypertension, Type II Diabetes Date Acquired: 11/08/2022 Weeks Of Treatment: 0 Clustered Wound: No Photos Wound Measurements Length: (cm) 1 Width: (cm) 2.5 Depth: (cm) 0.2 Area: (cm) 1.963 Volume: (cm) 0.393 % Reduction in Area: % Reduction in Volume: Epithelialization: None Tunneling: No Undermining: No Wound Description Classification: Grade 2 Exudate Amount: Large Exudate Type: Serosanguineous NOLLIE, DIPIRRO C ([id number]) Exudate Color: red, brown Foul Odor After Cleansing: No Slough/Fibrino Yes [phone] Page 10 of 19 Wound Bed Granulation Amount: Small (1-33%) Exposed Structure Granulation Quality: Pink Fascia Exposed: No Necrotic Amount: Large (67-100%) Fat Layer (Subcutaneous Tissue) Exposed: Yes Necrotic Quality: Adherent Slough Tendon Exposed: No Muscle Exposed: No Joint Exposed: No Bone Exposed: No Treatment Notes Wound #10 (Ankle) Wound Laterality: Left, Lateral Cleanser Byram Ancillary Kit - 15 Day Supply Discharge Instruction: Use supplies as instructed; Kit contains: (15) Saline Bullets; (15) 3x3 Gauze; 15 pr Gloves Soap and Water Discharge Instruction: Gently cleanse wound with antibacterial soap, rinse and pat dry prior to dressing wounds Peri-Wound Care Topical Primary Dressing Silvercel 4 1/4x 4 1/4 (in/in) Discharge Instruction: Apply Silvercel 4 1/4x 4 1/4 (in/in) as instructed Secondary Dressing ABD Pad 5x9 (in/in) Discharge Instruction: Cover with ABD pad Secured With Medipore T - 59M Medipore H Soft Cloth Surgical T ape ape, 2x2 (in/yd) Kerlix Roll Sterile or Non-Sterile 6-ply 4.5x4 (yd/yd) Discharge Instruction: Apply Kerlix as directed Compression Wrap Compression Stockings Add-Ons Electronic Signature(s) Signed: 06/24/2023 9:30:23 AM By: [name] RN Entered By:  [name] on 06/24/2023 09:30:23 -------------------------------------------------------------------------------- Wound Assessment Details Patient Name: Date of Service: Martin Riddle MES C. 06/24/2023 8:45 A M Medical Record Number: [id number] Patient Account Number: [id number] Date of Birth/Sex: Treating RN: November 18, 1970 (52 y.o. [name] Primary Care Jakeria Caissie: Marletta Lor Other Clinician: Referring Tatum Corl: Treating Ozro Russett/Extender: Gorum Derry Self, Referral Weeks in Treatment: 0 Wound Status Wound Number: 4 Primary Etiology: Diabetic Wound/Ulcer of the Lower Extremity Wound Location: Right, Lateral Ankle Wound Status: Open Wounding Event: Gradually Appeared Comorbid History: Hypertension, Type II Diabetes Date Acquired: 01/08/2023 DENT, KELNER ([id number]) [id number] Page 11 of 19 Weeks Of Treatment: 0 Clustered Wound: No Photos Wound Measurements Length: (cm) 5 Width: (cm) 8 Depth: (cm) 0.4 Area: (cm) 31.416 Volume: (cm) 12.566 % Reduction in Area: % Reduction in Volume: Epithelialization: None Tunneling: No Undermining: No Wound Description Classification: Grade 3 Exudate Amount: Large Exudate Type: Serosanguineous Exudate Color: red, brown Foul Odor After Cleansing: No Slough/Fibrino Yes Wound Bed Granulation Amount: None Present (0%) Exposed Structure Necrotic Amount: Large (67-100%) Fascia Exposed: No Necrotic Quality: Adherent Slough Fat Layer (Subcutaneous Tissue) Exposed: No Tendon Exposed: No Muscle Exposed: No Joint Exposed: No Bone Exposed: No Treatment Notes Wound #4 (Ankle) Wound Laterality: Right, Lateral Cleanser Byram Ancillary Kit - 15 Day Supply Discharge Instruction: Use supplies as instructed; Kit contains: (15) Saline Bullets; (15) 3x3 Gauze; 15 pr Gloves Soap and Water Discharge Instruction: Gently cleanse wound with antibacterial soap, rinse and pat dry prior to dressing wounds Peri-Wound  Care Topical Primary Dressing Silvercel 4 1/4x 4 1/4 (in/in) Discharge Instruction: Apply Silvercel 4 1/4x 4 1/4 (in/in) as instructed Secondary Dressing ABD Pad 5x9 (in/in) Discharge Instruction: Cover with ABD pad Secured With Medipore T - 59M Medipore H Soft Cloth Surgical T ape ape, 2x2 (in/yd) Kerlix  Roll Sterile or Non-Sterile 6-ply 4.5x4 (yd/yd) Discharge Instruction: Apply Kerlix as directed Compression Wrap Compression Stockings Add-Ons Electronic Signature(s) Signed: 06/24/2023 9:23:34 AM By: [name] RN Freida Busman, Daksh C1/15/2025 9:23:34 AM By: [name] RN Signed: ([id number]) [id number] Page 12 of 19 Entered By: [name] on 06/24/2023 09:23:34 -------------------------------------------------------------------------------- Wound Assessment Details Patient Name: Date of Service: Martin Riddle MES C. 06/24/2023 8:45 A M Medical Record Number: [id number] Patient Account Number: [id number] Date of Birth/Sex: Treating RN: 11-27-1970 (52 y.o. Judie Petit) [name] Primary Care Heavyn Yearsley: Marletta Lor Other Clinician: Referring Aldahir Litaker: Treating Spruha Weight/Extender: Spidle Derry Self, Referral Weeks in Treatment: 0 Wound Status Wound Number: 5 Primary Etiology: Diabetic Wound/Ulcer of the Lower Extremity Wound Location: Right T Great oe Wound Status: Open Wounding Event: Gradually Appeared Comorbid History: Hypertension, Type II Diabetes Date Acquired: 11/08/2022 Weeks Of Treatment: 0 Clustered Wound: No Photos Wound Measurements Length: (cm) 1.4 Width: (cm) 1.5 Depth: (cm) 0.2 Area: (cm) 1.649 Volume: (cm) 0.33 % Reduction in Area: % Reduction in Volume: Epithelialization: None Tunneling: No Undermining: No Wound Description Classification: Grade 2 Exudate Amount: Large Exudate Type: Serosanguineous Exudate Color: red, brown Foul Odor After Cleansing: No Slough/Fibrino Yes Wound Bed Granulation Amount: Small (1-33%) Exposed  Structure Granulation Quality: Red Fascia Exposed: No Necrotic Amount: Large (67-100%) Fat Layer (Subcutaneous Tissue) Exposed: Yes Necrotic Quality: Adherent Slough Tendon Exposed: No Muscle Exposed: No Joint Exposed: No Bone Exposed: No Treatment Notes Wound #5 (Toe Great) Wound Laterality: Right Cleanser SABER, PETRICCA ([id number]) [phone].pdf Page 13 of 19 Byram Ancillary Kit - 15 Day Supply Discharge Instruction: Use supplies as instructed; Kit contains: (15) Saline Bullets; (15) 3x3 Gauze; 15 pr Gloves Soap and Water Discharge Instruction: Gently cleanse wound with antibacterial soap, rinse and pat dry prior to dressing wounds Peri-Wound Care Topical Primary Dressing Silvercel 4 1/4x 4 1/4 (in/in) Discharge Instruction: Apply Silvercel 4 1/4x 4 1/4 (in/in) as instructed Secondary Dressing ABD Pad 5x9 (in/in) Discharge Instruction: Cover with ABD pad Secured With Medipore T - 81M Medipore H Soft Cloth Surgical T ape ape, 2x2 (in/yd) Kerlix Roll Sterile or Non-Sterile 6-ply 4.5x4 (yd/yd) Discharge Instruction: Apply Kerlix as directed Compression Wrap Compression Stockings Add-Ons Electronic Signature(s) Signed: 06/24/2023 9:25:05 AM By: [name] RN Entered By: [name] on 06/24/2023 09:25:04 -------------------------------------------------------------------------------- Wound Assessment Details Patient Name: Date of Service: Martin Riddle MES C. 06/24/2023 8:45 A M Medical Record Number: [id number] Patient Account Number: [id number] Date of Birth/Sex: Treating RN: 1970/12/05 (52 y.o. [name] Primary Care Coryn Mosso: Marletta Lor Other Clinician: Referring Jaquel Coomer: Treating Shakirah Kirkey/Extender: Gillison Derry Self, Referral Weeks in Treatment: 0 Wound Status Wound Number: 6 Primary Etiology: Diabetic Wound/Ulcer of the Lower Extremity Wound Location: Right T Second oe Wound Status: Open Wounding Event: Gradually Appeared Comorbid  History: Hypertension, Type II Diabetes Date Acquired: 11/08/2022 Weeks Of Treatment: 0 Clustered Wound: No Photos MARKEVIS, MENZEL ([id number]) [id number] Page 14 of 19 Wound Measurements Length: (cm) 0.5 Width: (cm) 1 Depth: (cm) 0.2 Area: (cm) 0.393 Volume: (cm) 0.079 % Reduction in Area: % Reduction in Volume: Epithelialization: None Tunneling: No Undermining: No Wound Description Classification: Grade 2 Exudate Amount: Large Exudate Type: Serosanguineous Exudate Color: red, brown Foul Odor After Cleansing: No Slough/Fibrino Yes Wound Bed Granulation Amount: Small (1-33%) Exposed Structure Granulation Quality: Red Fascia Exposed: No Necrotic Amount: Large (67-100%) Fat Layer (Subcutaneous Tissue) Exposed: Yes Necrotic Quality: Adherent Slough Tendon Exposed: No Muscle Exposed: No Joint Exposed: No Bone Exposed: No Treatment Notes Wound #6 (Toe Second)  Wound Laterality: Right Cleanser Byram Ancillary Kit - 15 Day Supply Discharge Instruction: Use supplies as instructed; Kit contains: (15) Saline Bullets; (15) 3x3 Gauze; 15 pr Gloves Soap and Water Discharge Instruction: Gently cleanse wound with antibacterial soap, rinse and pat dry prior to dressing wounds Peri-Wound Care Topical Primary Dressing Silvercel 4 1/4x 4 1/4 (in/in) Discharge Instruction: Apply Silvercel 4 1/4x 4 1/4 (in/in) as instructed Secondary Dressing ABD Pad 5x9 (in/in) Discharge Instruction: Cover with ABD pad Secured With Medipore T - 31M Medipore H Soft Cloth Surgical T ape ape, 2x2 (in/yd) Kerlix Roll Sterile or Non-Sterile 6-ply 4.5x4 (yd/yd) Discharge Instruction: Apply Kerlix as directed Compression Wrap Compression Stockings Add-Ons Electronic Signature(s) Signed: 06/24/2023 9:26:05 AM By: [name] RN Entered By: [name] on 06/24/2023 09:26:05 Wound Assessment  Details -------------------------------------------------------------------------------- Olin Hauser ([id number]) [id number] Page 15 of 19 Patient Name: Date of Service: Martin Riddle MES C. 06/24/2023 8:45 A M Medical Record Number: [id number] Patient Account Number: [id number] Date of Birth/Sex: Treating RN: January 30, 1971 (52 y.o. Judie Petit) [name] Primary Care Jaamal Farooqui: Marletta Lor Other Clinician: Referring Jamilia Jacques: Treating Jhordan Mckibben/Extender: Fuelling Derry Self, Referral Weeks in Treatment: 0 Wound Status Wound Number: 7 Primary Etiology: Diabetic Wound/Ulcer of the Lower Extremity Wound Location: Right, Medial Lower Leg Wound Status: Open Wounding Event: Gradually Appeared Comorbid History: Hypertension, Type II Diabetes Date Acquired: 11/08/2022 Weeks Of Treatment: 0 Clustered Wound: No Photos Wound Measurements Length: (cm) 3 % Reduction in Area: Width: (cm) 3 % Reduction in Volume: Depth: (cm) 0.1 Epithelialization: None Area: (cm) 7.069 Tunneling: No Volume: (cm) 0.707 Undermining: No Wound Description Classification: Grade 2 Foul Odor After Cleansing: No Exudate Amount: Medium Slough/Fibrino No Exudate Type: Serosanguineous Exudate Color: red, brown Wound Bed Granulation Amount: Large (67-100%) Exposed Structure Granulation Quality: Red Fascia Exposed: No Necrotic Amount: None Present (0%) Fat Layer (Subcutaneous Tissue) Exposed: Yes Tendon Exposed: No Muscle Exposed: No Joint Exposed: No Bone Exposed: No Treatment Notes Wound #7 (Lower Leg) Wound Laterality: Right, Medial Cleanser Byram Ancillary Kit - 15 Day Supply Discharge Instruction: Use supplies as instructed; Kit contains: (15) Saline Bullets; (15) 3x3 Gauze; 15 pr Gloves Soap and Water Discharge Instruction: Gently cleanse wound with antibacterial soap, rinse and pat dry prior to dressing wounds Peri-Wound Care Topical Primary Dressing Silvercel 4 1/4x 4 1/4  (in/in) Discharge Instruction: Apply Silvercel 4 1/4x 4 1/4 (in/in) as instructed Secondary Dressing ABD Pad 5x9 (in/in) Discharge Instruction: Cover with ABD pad ZAYVIAN, GEWIRTZ ([id number]) [phone].pdf Page 16 of 19 Secured With Medipore T - 31M Medipore H Soft Cloth Surgical T ape ape, 2x2 (in/yd) Kerlix Roll Sterile or Non-Sterile 6-ply 4.5x4 (yd/yd) Discharge Instruction: Apply Kerlix as directed Compression Wrap Compression Stockings Add-Ons Electronic Signature(s) Signed: 06/24/2023 9:27:02 AM By: [name] RN Entered By: [name] on 06/24/2023 09:27:01 -------------------------------------------------------------------------------- Wound Assessment Details Patient Name: Date of Service: Martin Riddle MES C. 06/24/2023 8:45 A M Medical Record Number: [id number] Patient Account Number: [id number] Date of Birth/Sex: Treating RN: 1971/01/25 (52 y.o. Judie Petit) [name] Primary Care Jayd Cadieux: Marletta Lor Other Clinician: Referring Latajah Thuman: Treating Farmer Mccahill/Extender: Blust Derry Self, Referral Weeks in Treatment: 0 Wound Status Wound Number: 8 Primary Etiology: Diabetic Wound/Ulcer of the Lower Extremity Wound Location: Left T Great oe Wound Status: Open Wounding Event: Gradually Appeared Comorbid History: Hypertension, Type II Diabetes Date Acquired: 11/08/2022 Weeks Of Treatment: 0 Clustered Wound: No Photos Wound Measurements Length: (cm) 2 Width: (cm) 3 Depth: (cm) 0.2 Area: (cm) 4.712 Volume: (cm) 0.942 % Reduction  in Area: % Reduction in Volume: Epithelialization: None Tunneling: No Undermining: No Wound Description Classification: Grade 2 Exudate Amount: Large Exudate Type: Serosanguineous Exudate Color: red, brown Foul Odor After Cleansing: No Slough/Fibrino Yes Wound Bed Granulation Amount: Small (1-33%) Exposed HOPPER, NORMANN ([id number]) [id number] Page 17 of 19 Granulation Quality:  Pink Fascia Exposed: No Necrotic Amount: Large (67-100%) Fat Layer (Subcutaneous Tissue) Exposed: Yes Necrotic Quality: Adherent Slough Tendon Exposed: No Muscle Exposed: No Joint Exposed: No Bone Exposed: No Treatment Notes Wound #8 (Toe Great) Wound Laterality: Left Cleanser Byram Ancillary Kit - 15 Day Supply Discharge Instruction: Use supplies as instructed; Kit contains: (15) Saline Bullets; (15) 3x3 Gauze; 15 pr Gloves Soap and Water Discharge Instruction: Gently cleanse wound with antibacterial soap, rinse and pat dry prior to dressing wounds Peri-Wound Care Topical Primary Dressing Silvercel 4 1/4x 4 1/4 (in/in) Discharge Instruction: Apply Silvercel 4 1/4x 4 1/4 (in/in) as instructed Secondary Dressing ABD Pad 5x9 (in/in) Discharge Instruction: Cover with ABD pad Secured With Medipore T - 34M Medipore H Soft Cloth Surgical T ape ape, 2x2 (in/yd) Kerlix Roll Sterile or Non-Sterile 6-ply 4.5x4 (yd/yd) Discharge Instruction: Apply Kerlix as directed Compression Wrap Compression Stockings Add-Ons Electronic Signature(s) Signed: 06/24/2023 9:28:24 AM By: [name] RN Entered By: [name] on 06/24/2023 09:28:23 -------------------------------------------------------------------------------- Wound Assessment Details Patient Name: Date of Service: Martin Riddle MES C. 06/24/2023 8:45 A M Medical Record Number: [id number] Patient Account Number: [id number] Date of Birth/Sex: Treating RN: May 07, 1971 (53 y.o. [name] Primary Care Lonza Shimabukuro: Marletta Lor Other Clinician: Referring Deaisha Welborn: Treating Katricia Prehn/Extender: Scheibel Derry Self, Referral Weeks in Treatment: 0 Wound Status Wound Number: 9 Primary Etiology: Diabetic Wound/Ulcer of the Lower Extremity Wound Location: Left, Dorsal Foot Wound Status: Open Wounding Event: Gradually Appeared Comorbid History: Hypertension, Type II Diabetes Date Acquired: 11/08/2022 Weeks Of Treatment: 0 Clustered Wound:  No Photos AISEN, CAPATI ([id number]) [id number] Page 18 of 19 Wound Measurements Length: (cm) 8 Width: (cm) 4 Depth: (cm) 0.3 Area: (cm) 25.133 Volume: (cm) 7.54 % Reduction in Area: % Reduction in Volume: Epithelialization: None Tunneling: No Undermining: No Wound Description Classification: Grade 2 Exudate Amount: Large Exudate Type: Serosanguineous Exudate Color: red, brown Foul Odor After Cleansing: No Slough/Fibrino Yes Wound Bed Granulation Amount: Small (1-33%) Exposed Structure Granulation Quality: Pink Fascia Exposed: No Necrotic Amount: Large (67-100%) Fat Layer (Subcutaneous Tissue) Exposed: Yes Necrotic Quality: Adherent Slough Tendon Exposed: No Muscle Exposed: No Joint Exposed: No Bone Exposed: No Treatment Notes Wound #9 (Foot) Wound Laterality: Dorsal, Left Cleanser Byram Ancillary Kit - 15 Day Supply Discharge Instruction: Use supplies as instructed; Kit contains: (15) Saline Bullets; (15) 3x3 Gauze; 15 pr Gloves Soap and Water Discharge Instruction: Gently cleanse wound with antibacterial soap, rinse and pat dry prior to dressing wounds Peri-Wound Care Topical Primary Dressing Silvercel 4 1/4x 4 1/4 (in/in) Discharge Instruction: Apply Silvercel 4 1/4x 4 1/4 (in/in) as instructed Secondary Dressing ABD Pad 5x9 (in/in) Discharge Instruction: Cover with ABD pad Secured With Medipore T - 34M Medipore H Soft Cloth Surgical T ape ape, 2x2 (in/yd) Kerlix Roll Sterile or Non-Sterile 6-ply 4.5x4 (yd/yd) Discharge Instruction: Apply Kerlix as directed Compression Wrap Compression Stockings Add-Ons Electronic Signature(s) Signed: 06/24/2023 9:29:24 AM By: [name] RN Entered By: [name] on 06/24/2023 56:38:75 Olin Hauser ([id number]) [id number] Page 19 of 19 -------------------------------------------------------------------------------- Vitals Details Patient Name: Date of Service: Martin Riddle MES C. 06/24/2023 8:45 A M Medical Record Number: [id number] Patient Account Number: [id number] Date of  Birth/Sex: Treating RN: Sep 09, 1970 (53 y.o. Judie Petit) Epps, Lyla Son Primary Care Jaidyn Kuhl: Marletta Lor Other Clinician: Referring Binh Doten: Treating Dachelle Molzahn/Extender: Holderman Derry Self, Referral Weeks in Treatment: 0 Vital Signs Time Taken: 08:58 Temperature (F): 98.3 Height (in): 77 Pulse (bpm): 79 Source: Stated Respiratory Rate (breaths/min): 18 Weight (lbs): 360 Blood Pressure (mmHg): 152/85 Source: Stated Reference Range: 80 - 120 mg / dl Body Mass Index (BMI): 42.7 Electronic Signature(s) Signed: 06/26/2023 8:18:57 AM By: [name] RN Entered By: [name] on 06/24/2023 08:59:24

## 2023-06-26 NOTE — Progress Notes (Signed)
Martin, Riddle ([id number]) [id number] Page 1 of 5 Visit Report for 06/24/2023 Abuse Risk Screen Details Patient Name: Date of Service: Martin Riddle MES C. 06/24/2023 8:45 A M Medical Record Number: [id number] Patient Account Number: [id number] Date of Birth/Sex: Treating RN: 1970/06/26 (53 y.o. Judie Petit) Martin Riddle Primary Care Ray Glacken: Marletta Lor Other Clinician: Referring Yakir Wenke: Treating Lea Walbert/Extender: Schappell Derry Self, Referral Weeks in Treatment: 0 Abuse Risk Screen Items Answer ABUSE RISK SCREEN: Has anyone close to you tried to hurt or harm you recentlyo No Do you feel uncomfortable with anyone in your familyo No Has anyone forced you do things that you didnt want to doo No Electronic Signature(s) Signed: 06/26/2023 8:18:57 AM By: Martin Pax RN Entered By: Martin Riddle on 06/24/2023 09:01:00 -------------------------------------------------------------------------------- Activities of Daily Living Details Patient Name: Date of Service: Martin Riddle MES C. 06/24/2023 8:45 A M Medical Record Number: [id number] Patient Account Number: [id number] Date of Birth/Sex: Treating RN: 1970/10/20 (52 y.o. Martin Riddle Primary Care Alaura Schippers: Marletta Lor Other Clinician: Referring Demitrus Francisco: Treating Nancie Bocanegra/Extender: Espy Derry Self, Referral Weeks in Treatment: 0 Activities of Daily Living Items Answer Activities of Daily Living (Please select one for each item) Drive Automobile Completely Able T Medications ake Completely Able Use T elephone Completely Able Care for Appearance Completely Able Use T oilet Completely Able Bath / Shower Completely Able Dress Self Completely Able Feed Self Completely Able Walk Completely Able Get In / Out Bed Completely Able Housework Completely TYMARI, BEK ([id number]) [phone] [id number].pdf Page 2 of 5 Prepare Meals Completely Able Handle Money Completely Able Shop for Self  Completely Able Electronic Signature(s) Signed: 06/26/2023 8:18:57 AM By: Martin Pax RN Entered By: Martin Riddle on 06/24/2023 09:01:44 -------------------------------------------------------------------------------- Education Screening Details Patient Name: Date of Service: Martin Riddle MES C. 06/24/2023 8:45 A M Medical Record Number: [id number] Patient Account Number: [id number] Date of Birth/Sex: Treating RN: January 09, 1971 (52 y.o. Martin Riddle Primary Care Airel Magadan: Marletta Lor Other Clinician: Referring Tynleigh Birt: Treating Chanta Bauers/Extender: Sypher Derry Self, Referral Weeks in Treatment: 0 Primary Learner Assessed: Patient Learning Preferences/Education Level/Primary Language Learning Preference: Explanation Highest Education Level: High School Preferred Language: English Cognitive Barrier Language Barrier: No Translator Needed: No Memory Deficit: No Emotional Barrier: No Cultural/Religious Beliefs Affecting Medical Care: No Physical Barrier Impaired Vision: No Impaired Hearing: No Decreased Hand dexterity: No Knowledge/Comprehension Knowledge Level: Medium Comprehension Level: Medium Ability to understand written instructions: Medium Motivation Anxiety Level: Anxious Cooperation: Cooperative Education Importance: Acknowledges Need Interest in Health Problems: Asks Questions Perception: Coherent Willingness to Engage in Self-Management Low Activities: Readiness to Engage in Self-Management Low Activities: Electronic Signature(s) Signed: 06/26/2023 8:18:57 AM By: Martin Pax RN Entered By: Martin Riddle on 06/24/2023 09:02:26 BALDO, Martin ([id number]) [phone] [id number].pdf Page 3 of 5 -------------------------------------------------------------------------------- Fall Risk Assessment Details Patient Name: Date of Service: Martin Riddle MES C. 06/24/2023 8:45 A M Medical Record Number: [id number] Patient Account Number: [id number] Date of  Birth/Sex: Treating RN: 01-18-1971 (52 y.o. Judie Petit) Martin Riddle Primary Care Thao Vanover: Marletta Lor Other Clinician: Referring Thalia Turkington: Treating Aneth Schlagel/Extender: Vultaggio Derry Self, Referral Weeks in Treatment: 0 Fall Risk Assessment Items Have you had 2 or more falls in the last 12 monthso 0 No Have you had any fall that resulted in injury in the last 12 monthso 0 No FALLS RISK SCREEN History of falling - immediate or within 3 months 0 No Secondary diagnosis (Do you have 2 or more medical diagnoseso) 0 No Ambulatory aid None/bed rest/wheelchair/nurse 0 Yes Crutches/cane/walker  0 No Furniture 0 No Intravenous therapy Access/Saline/Heparin Lock 0 No Gait/Transferring Normal/ bed rest/ wheelchair 0 Yes Weak (short steps with or without shuffle, stooped but able to lift head while walking, may seek 0 No support from furniture) Impaired (short steps with shuffle, may have difficulty arising from chair, head down, impaired 0 No balance) Mental Status Oriented to own ability 0 Yes Electronic Signature(s) Signed: 06/26/2023 8:18:57 AM By: Martin Pax RN Entered By: Martin Riddle on 06/24/2023 09:02:38 -------------------------------------------------------------------------------- Foot Assessment Details Patient Name: Date of Service: Martin Riddle MES C. 06/24/2023 8:45 A M Medical Record Number: [id number] Patient Account Number: [id number] Date of Birth/Sex: Treating RN: 1971-01-30 (52 y.o. Martin Riddle Primary Care Azrael Huss: Marletta Lor Other Clinician: Referring Menashe Kafer: Treating Aidon Klemens/Extender: Pech Derry Self, Referral Weeks in Treatment: 0 Foot Assessment Items [address] [place], [place] C ([id number]) [id number] Page 4 of 5 + = Sensation present, - = Sensation absent, C = Callus, U = Ulcer R = Redness, W = Warmth, M = Maceration, PU = Pre-ulcerative lesion F = Fissure, S = Swelling, D = Dryness Assessment Right: Left: Other Deformity: No  No Prior Foot Ulcer: No No Prior Amputation: No No Charcot Joint: No No Ambulatory Status: Ambulatory Without Help Gait: Steady Electronic Signature(s) Signed: 06/26/2023 8:18:57 AM By: Martin Pax RN Entered By: Martin Riddle on 06/24/2023 09:47:04 -------------------------------------------------------------------------------- Nutrition Risk Screening Details Patient Name: Date of Service: Martin Riddle MES C. 06/24/2023 8:45 A M Medical Record Number: [id number] Patient Account Number: [id number] Date of Birth/Sex: Treating RN: [birth year] (52 y.o. Judie Petit) Martin Riddle Primary Care Kleigh Hoelzer: Marletta Lor Other Clinician: Referring Lynnleigh Soden: Treating Lometa Riggin/Extender: Harts Derry Self, Referral Weeks in Treatment: 0 Height (in): 77 Weight (lbs): 360 Body Mass Index (BMI): 42.7 Nutrition Risk Screening Items Score Screening NUTRITION RISK SCREEN: I have an illness or condition that made me change the kind and/or amount of food I eat 2 Yes I eat fewer than two meals per day 0 No I eat few fruits and vegetables, or milk products 0 No I have three or more drinks of beer, liquor or wine almost every day 0 No I have tooth or mouth problems that make it hard for me to eat 0 No I don't always have enough money to buy the food I need 0 No FINNEUS, RASOR ([id number]) [phone] [id number] Page 5 of 5 I eat alone most of the time 0 No I take three or more different prescribed or over-the-counter drugs a day 1 Yes Without wanting to, I have lost or gained 10 pounds in the last six months 0 No I am not always physically able to shop, cook and/or feed myself 0 No Nutrition Protocols Good Risk Protocol Moderate Risk Protocol 0 Provide education on nutrition High Risk Proctocol Risk Level: Moderate Risk Score: 3 Electronic Signature(s) Signed: 06/26/2023 8:18:57 AM By: Martin Pax RN Entered By: Martin Riddle on 06/24/2023 09:02:54

## 2023-06-26 NOTE — Progress Notes (Signed)
IZAHIA, MOLDER ([id number]) [id number] Page 1 of 17 Visit Report for 06/24/2023 Chief Complaint Document Details Patient Name: Date of Service: Jeneen Rinks MES C. 06/24/2023 8:45 A M Medical Record Number: [id number] Patient Account Number: [id number] Date of Birth/Sex: Treating RN: 1971-03-28 (53 y.o. Judie Petit) [name] Primary Care Provider: Marletta Lor Other Clinician: Referring Provider: Treating Provider/Extender: Christley Derry Self, Referral Weeks in Treatment: 0 Information Obtained from: Patient Chief Complaint Bilateral lower extremities and foot ulcers Electronic Signature(s) Signed: 06/24/2023 9:39:42 AM By: Shewell Derry PA-C Entered By: Hopfer Derry on 06/24/2023 09:39:42 -------------------------------------------------------------------------------- Debridement Details Patient Name: Date of Service: Jeneen Rinks MES C. 06/24/2023 8:45 A M Medical Record Number: [id number] Patient Account Number: [id number] Date of Birth/Sex: Treating RN: 1971-05-30 (52 y.o. [name] Primary Care Provider: Marletta Lor Other Clinician: Referring Provider: Treating Provider/Extender: Haupt Derry Self, Referral Weeks in Treatment: 0 Debridement Performed for Assessment: Wound #10 Left,Lateral Ankle Performed By: Physician Kass Derry, PA-C Debridement Type: Chemical/Enzymatic/Mechanical Agent Used: saline gauze Severity of Tissue Pre Debridement: Fat layer exposed Level of Consciousness (Pre-procedure): Awake and Alert Pre-procedure Verification/Time Out No Taken: Start Time: 09:40 Percent of Wound Bed Debrided: Instrument: Other : saline gauze Bleeding: None End Time: 09:56 Procedural Pain: 0 Post Procedural Pain: 0 Response to Treatment: Procedure was tolerated well Level of Consciousness (Post- Awake and Alert procedure): KIKO, HOLSTE ([id number]) [phone].pdf Page 2 of 17 Post Debridement Measurements of Total Wound Length:  (cm) 1 Width: (cm) 2.5 Depth: (cm) 0.2 Volume: (cm) 0.393 Character of Wound/Ulcer Post Debridement: Requires Further Debridement Severity of Tissue Post Debridement: Fat layer exposed Post Procedure Diagnosis Same as Pre-procedure Electronic Signature(s) Signed: 06/25/2023 6:05:15 PM By: Dorow Derry PA-C Signed: 06/26/2023 8:18:57 AM By: [name] RN Entered By: [name] on 06/24/2023 09:52:35 -------------------------------------------------------------------------------- Debridement Details Patient Name: Date of Service: Jeneen Rinks MES C. 06/24/2023 8:45 A M Medical Record Number: [id number] Patient Account Number: [id number] Date of Birth/Sex: Treating RN: [birth year] (53 y.o. [name] Primary Care Provider: Marletta Lor Other Clinician: Referring Provider: Treating Provider/Extender: Westall Derry Self, Referral Weeks in Treatment: 0 Debridement Performed for Assessment: Wound #4 Right,Lateral Ankle Performed By: Physician Flori Derry, PA-C The following information was scribed by: [name] The information was scribed for: Biven Derry Debridement Type: Chemical/Enzymatic/Mechanical Agent Used: saline gauze Severity of Tissue Pre Debridement: Fat layer exposed Level of Consciousness (Pre-procedure): Awake and Alert Pre-procedure Verification/Time Out No Taken: Start Time: 09:40 Percent of Wound Bed Debrided: Instrument: Other : saline gauze Bleeding: None End Time: 09:56 Procedural Pain: 0 Post Procedural Pain: 0 Response to Treatment: Procedure was tolerated well Level of Consciousness (Post- Awake and Alert procedure): Post Debridement Measurements of Total Wound Length: (cm) 5 Width: (cm) 8 Depth: (cm) 0.4 Volume: (cm) 12.566 Character of Wound/Ulcer Post Debridement: Requires Further Debridement Severity of Tissue Post Debridement: Fat layer exposed Post Procedure Diagnosis Same as Pre-procedure Electronic Signature(s) Signed: 06/25/2023 6:05:15  PM By: Vanorder Derry PA-C Signed: 06/26/2023 8:18:57 AM By: [name] RN Freida Busman, [place] ([id number]) [phone].pdf Page 3 of 17 Entered By: [name] on 06/24/2023 09:53:09 -------------------------------------------------------------------------------- Debridement Details Patient Name: Date of Service: Jeneen Rinks MES C. 06/24/2023 8:45 A M Medical Record Number: [id number] Patient Account Number: [id number] Date of Birth/Sex: Treating RN: 08/02/1970 (52 y.o. [name] Primary Care Provider: Marletta Lor Other Clinician: Referring Provider: Treating Provider/Extender: Packham Derry Self, Referral Weeks in Treatment: 0 Debridement Performed for Assessment: Wound #5 Right T Great oe  Performed By: Physician Rosenzweig Derry, PA-C The following information was scribed by: [name] The information was scribed for: Thumma Derry Debridement Type: Chemical/Enzymatic/Mechanical Agent Used: saline gauze Severity of Tissue Pre Debridement: Fat layer exposed Level of Consciousness (Pre-procedure): Awake and Alert Pre-procedure Verification/Time Out No Taken: Start Time: 09:40 Percent of Wound Bed Debrided: Instrument: Other : saline gauze Bleeding: None End Time: 09:56 Procedural Pain: 0 Post Procedural Pain: 0 Response to Treatment: Procedure was tolerated well Level of Consciousness (Post- Awake and Alert procedure): Post Debridement Measurements of Total Wound Length: (cm) 1.4 Width: (cm) 1.5 Depth: (cm) 0.2 Volume: (cm) 0.33 Character of Wound/Ulcer Post Debridement: Requires Further Debridement Severity of Tissue Post Debridement: Fat layer exposed Post Procedure Diagnosis Same as Pre-procedure Electronic Signature(s) Signed: 06/25/2023 6:05:15 PM By: Vanevery Derry PA-C Signed: 06/26/2023 8:18:57 AM By: [name] RN Entered By: [name] on 06/24/2023 09:53:42 LANSING, AGRAWAL ([id number]) [id number] Page 4 of  17 -------------------------------------------------------------------------------- Debridement Details Patient Name: Date of Service: Jeneen Rinks MES C. 06/24/2023 8:45 A M Medical Record Number: [id number] Patient Account Number: [id number] Date of Birth/Sex: Treating RN: [birth year] (52 y.o. Judie Petit) [name] Primary Care Provider: Marletta Lor Other Clinician: Referring Provider: Treating Provider/Extender: Bucker Derry Self, Referral Weeks in Treatment: 0 Debridement Performed for Assessment: Wound #6 Right T Second oe Performed By: Physician Mowatt Derry, PA-C Debridement Type: Chemical/Enzymatic/Mechanical Agent Used: saline gauze Severity of Tissue Pre Debridement: Fat layer exposed Level of Consciousness (Pre-procedure): Awake and Alert Pre-procedure Verification/Time Out No Taken: Start Time: 09:40 Percent of Wound Bed Debrided: Instrument: Other : saline gauze Bleeding: None End Time: 09:56 Procedural Pain: 0 Post Procedural Pain: 0 Response to Treatment: Procedure was tolerated well Level of Consciousness (Post- Awake and Alert procedure): Post Debridement Measurements of Total Wound Length: (cm) 0.5 Width: (cm) 1 Depth: (cm) 0.2 Volume: (cm) 0.079 Character of Wound/Ulcer Post Debridement: Requires Further Debridement Severity of Tissue Post Debridement: Fat layer exposed Post Procedure Diagnosis Same as Pre-procedure Electronic Signature(s) Signed: 06/25/2023 6:05:15 PM By: Sanjose Derry PA-C Signed: 06/26/2023 8:18:57 AM By: [name] RN Entered By: [name] on 06/24/2023 09:54:04 -------------------------------------------------------------------------------- Debridement Details Patient Name: Date of Service: Jeneen Rinks MES C. 06/24/2023 8:45 A M Medical Record Number: [id number] Patient Account Number: [id number] Date of Birth/Sex: Treating RN: [birth year] (53 y.o. [name] Primary Care Provider: Marletta Lor Other Clinician: Referring  Provider: Treating Provider/Extender: Bruington Derry Self, Referral Weeks in Treatment: 0 Debridement Performed for Assessment: Wound #7 Right,Medial Lower Leg Performed By: Physician Huard Derry, PA-C Debridement Type: Chemical/Enzymatic/Mechanical Agent Used: saline gauze Severity of Tissue Pre Debridement: Fat layer exposed Level of Consciousness (Pre-procedure): Awake and Alert Pre-procedure Verification/Time Out No Taken: Start Time: 09:40 Percent of Wound Bed Debrided: ALEKZANDER, KARGES ([id number]) [phone].pdf Page 5 of 17 Instrument: Other : saline gauze Bleeding: None End Time: 09:56 Procedural Pain: 0 Post Procedural Pain: 0 Response to Treatment: Procedure was tolerated well Level of Consciousness (Post- Awake and Alert procedure): Post Debridement Measurements of Total Wound Length: (cm) 3 Width: (cm) 3 Depth: (cm) 0.1 Volume: (cm) 0.707 Character of Wound/Ulcer Post Debridement: Requires Further Debridement Severity of Tissue Post Debridement: Fat layer exposed Post Procedure Diagnosis Same as Pre-procedure Electronic Signature(s) Signed: 06/25/2023 6:05:15 PM By: Fadden Derry PA-C Signed: 06/26/2023 8:18:57 AM By: [name] RN Entered By: [name] on 06/24/2023 09:54:28 -------------------------------------------------------------------------------- Debridement Details Patient Name: Date of Service: Jeneen Rinks MES C. 06/24/2023 8:45 A M Medical Record Number: [id number] Patient  Account Number: [id number] Date of Birth/Sex: Treating RN: December 19, 1970 (52 y.o. Judie Petit) [name] Primary Care Provider: Marletta Lor Other Clinician: Referring Provider: Treating Provider/Extender: Hon Derry Self, Referral Weeks in Treatment: 0 Debridement Performed for Assessment: Wound #8 Left T Great oe Performed By: Physician Ramakrishnan Derry, PA-C Debridement Type: Chemical/Enzymatic/Mechanical Agent Used: saline gauze Severity of Tissue Pre Debridement:  Fat layer exposed Level of Consciousness (Pre-procedure): Awake and Alert Pre-procedure Verification/Time Out No Taken: Start Time: 09:40 Percent of Wound Bed Debrided: Instrument: Other : saline gauze Bleeding: None End Time: 09:56 Procedural Pain: 0 Post Procedural Pain: 0 Response to Treatment: Procedure was tolerated well Level of Consciousness (Post- Awake and Alert procedure): Post Debridement Measurements of Total Wound Length: (cm) 2 Width: (cm) 3 Depth: (cm) 0.2 Volume: (cm) 0.942 Character of Wound/Ulcer Post Debridement: Requires Further Debridement Severity of Tissue Post Debridement: Fat layer exposed Post Procedure Diagnosis JATERRIUS, GERLITZ ([id number]) [phone].pdf Page 6 of 17 Same as Pre-procedure Electronic Signature(s) Signed: 06/25/2023 6:05:15 PM By: Amparan Derry PA-C Signed: 06/26/2023 8:18:57 AM By: [name] RN Entered By: [name] on 06/24/2023 09:54:56 -------------------------------------------------------------------------------- Debridement Details Patient Name: Date of Service: Jeneen Rinks MES C. 06/24/2023 8:45 A M Medical Record Number: [id number] Patient Account Number: [id number] Date of Birth/Sex: Treating RN: [birth year] (53 y.o. Judie Petit) [name] Primary Care Provider: Marletta Lor Other Clinician: Referring Provider: Treating Provider/Extender: Blackerby Derry Self, Referral Weeks in Treatment: 0 Debridement Performed for Assessment: Wound #9 Left,Dorsal Foot Performed By: Physician Cassata Derry, PA-C The following information was scribed by: [name] The information was scribed for: Chicoine Derry Debridement Type: Chemical/Enzymatic/Mechanical Agent Used: saline gauze Severity of Tissue Pre Debridement: Fat layer exposed Level of Consciousness (Pre-procedure): Awake and Alert Pre-procedure Verification/Time Out No Taken: Start Time: 09:40 Percent of Wound Bed Debrided: Instrument: Other : saline  gauze Bleeding: None End Time: 09:56 Procedural Pain: 0 Post Procedural Pain: 0 Response to Treatment: Procedure was tolerated well Level of Consciousness (Post- Awake and Alert procedure): Post Debridement Measurements of Total Wound Length: (cm) 8 Width: (cm) 4 Depth: (cm) 0.3 Volume: (cm) 7.54 Character of Wound/Ulcer Post Debridement: Requires Further Debridement Severity of Tissue Post Debridement: Fat layer exposed Post Procedure Diagnosis Same as Pre-procedure Electronic Signature(s) Signed: 06/25/2023 6:05:15 PM By: Chicoine Derry PA-C Signed: 06/26/2023 8:18:57 AM By: [name] RN Entered By: [name] on 06/24/2023 09:55:20 ILYA, UNANGST ([id number]) [phone].pdf Page 7 of 17 -------------------------------------------------------------------------------- HPI Details Patient Name: Date of Service: Jeneen Rinks MES C. 06/24/2023 8:45 A M Medical Record Number: [id number] Patient Account Number: [id number] Date of Birth/Sex: Treating RN: [birth year] (52 y.o. Judie Petit) [name] Primary Care Provider: Marletta Lor Other Clinician: Referring Provider: Treating Provider/Extender: Vanhise Derry Self, Referral Weeks in Treatment: 0 History of Present Illness Chronic/Inactive Conditions Condition 1: 06-24-23 patient's arterial studies really have never been done in order for [place] to know how his blood flow is. He does have a cool foot to touch on the right compared to the left which has me concerned today and again we were unable to obtain any ABIs in the office. I do believe that he needs to have a formal arterial study with ABI and TBI to further evaluate the situation I fear that he may have critical limb ischemia on the right foot which may be part of the reason why he is having such significant pain. Patient has a history of diabetes mellitus type 2, lymphedema, hypertension, and he does have chronic wounds of lower extremities  which have been present for  several years. HPI Description: 09-26-2021 upon evaluation today patient presents today for evaluation of wounds on the right heel, left lower leg, and left fifth metatarsal head location more plantar. With that being said these wounds have been present for since around June 09, 2021. Currently the patient has had dressings on that were unsure exactly how long they have been in place he has legs that appear to be extremely dry and he had a lot of dead tissue and dry skin buildup. It does make me concerned about the fact that he has not been changing these dressings appropriately nor anywhere close to regularly. Currently with regard to the wounds he does seem to be doing decently well all things considered. That is at least good news. I see no signs of obvious active infection at the moment. The patient's most recent ABI was on 05/31/2021 and was on the right 1.15 and left 1.02 there was no arterial study performed for completeness nor a TBI. This may be something to consider going forward. Patient has otherwise a history of diabetes mellitus type 2, chronic venous insufficiency, lymphedema, and hypertension. Readmission: 06-24-23 patient presents today for reevaluation here in the clinic concerning issues that he has been experiencing with regard to his bilateral lower extremities. This is a gentleman that I previously saw in 2023. It has been almost 2 years since the last time he was here in the clinic. Nonetheless he tells me he is continue to have issues with his legs unfortunately. He tells me that his feet and legs hurt so bad that he really does not let anybody touch them other than his wife and me. With that being said when I saw him in 2023 we were attempting to get him into vascular but he did not end up going to see them he tells me "that was my fault". With that being said he tells me that he needs to do what needs to be done in order to get this better at this point. He does have signs  of significant infection in my opinion of the extremities bilaterally. I believe that he likely has a Pseudomonas infection but it may not be the only thing. We can actually obtain a PCR culture today to send in and evaluate for what is showing up on this cultures I know how to best treat him from a bacteria standpoint. He does not really have any recent ABIs or TBI's unfortunately. That is one of the big things that we are getting need to do. Electronic Signature(s) Signed: 06/24/2023 6:11:44 PM By: Schadt Derry PA-C Entered By: Gaumer Derry on 06/24/2023 18:11:43 -------------------------------------------------------------------------------- Physical Exam Details Patient Name: Date of Service: Jeneen Rinks MES C. 06/24/2023 8:45 A M Medical Record Number: [id number] Patient Account Number: [id number] Date of Birth/Sex: Treating RN: 02/13/1971 (52 y.o. [name] Primary Care Provider: Marletta Lor Other Clinician: Referring Provider: Treating Provider/Extender: Pellegrino Derry Self, Referral Weeks in Treatment: [address] CEYLON, MAXIM ([id number]) [id number] Page 8 of 17 patient is hypertensive.. pulse regular and within target range for patient.Marland Kitchen respirations regular, non-labored and within target range for patient.Marland Kitchen temperature within target range for patient.. Obese and well-hydrated in no acute distress. Eyes conjunctiva clear no eyelid edema noted. pupils equal round and reactive to light and accommodation. Ears, Nose, Mouth, and Throat no gross abnormality of ear auricles or external auditory canals. normal hearing noted during conversation. mucus membranes moist. Respiratory normal breathing without difficulty. Cardiovascular  Absent posterior tibial and dorsalis pedis pulses bilateral lower extremities. 2+ pitting edema of the bilateral lower extremities. Musculoskeletal normal gait and posture. no significant deformity or arthritic changes, no  loss or range of motion, no clubbing. Psychiatric this patient is able to make decisions and demonstrates good insight into disease process. Alert and Oriented x 3. pleasant and cooperative. Notes Upon inspection patient's wound bed actually showed signs of again having poor arterial flow signs in both feet although the right foot was worse than the left feeling very cold to touch and this has me very concerned. I believe that he may be bordering on critical limb ischemia and I discussed that with him today I think he needs to have an arterial study performed ASAP. I believe that his lymphedema is stage III and significant and if we do not get this under control and get him better arterial flow he is at risk for amputation. Electronic Signature(s) Signed: 06/24/2023 6:12:26 PM By: Hamler Derry PA-C Entered By: Kang Derry on 06/24/2023 18:12:26 -------------------------------------------------------------------------------- Physician Orders Details Patient Name: Date of Service: Jeneen Rinks MES C. 06/24/2023 8:45 A M Medical Record Number: [id number] Patient Account Number: [id number] Date of Birth/Sex: Treating RN: 04-23-1971 (52 y.o. Judie Petit) [name] Primary Care Provider: Marletta Lor Other Clinician: Referring Provider: Treating Provider/Extender: Sperling Derry Self, Referral Weeks in Treatment: 0 The following information was scribed by: [name] The information was scribed for: Billing Derry Verbal / Phone Orders: No Diagnosis Coding ICD-10 Coding Code Description E11.621 Type 2 diabetes mellitus with foot ulcer L97.522 Non-pressure chronic ulcer of other part of left foot with fat layer exposed L97.412 Non-pressure chronic ulcer of right heel and midfoot with fat layer exposed I89.0 Lymphedema, not elsewhere classified L97.312 Non-pressure chronic ulcer of right ankle with fat layer exposed L97.322 Non-pressure chronic ulcer of left ankle with fat layer exposed L97.812 Non-pressure  chronic ulcer of other part of right lower leg with fat layer exposed I10 Essential (primary) hypertension Follow-up Appointments Return Appointment in 1 week. [address] ANIV, GELL ([id number]) [id number] Page 9 of 17 May shower; gently cleanse wound with antibacterial soap, rinse and pat dry prior to dressing wounds Anesthetic (Use 'Patient Medications' Section for Anesthetic Order Entry) Lidocaine applied to wound bed Edema Control - Orders / Instructions Elevate, Exercise Daily and A void Standing for Long Periods of Time. Elevate legs to the level of the heart and pump ankles as often as possible Elevate leg(s) parallel to the floor when sitting. Wound Treatment Wound #10 - Ankle Wound Laterality: Left, Lateral Cleanser: Byram Ancillary Kit - 15 Day Supply (DME) (Generic) 3 x Per Week/30 Days Discharge Instructions: Use supplies as instructed; Kit contains: (15) Saline Bullets; (15) 3x3 Gauze; 15 pr Gloves Cleanser: Soap and Water 3 x Per Week/30 Days Discharge Instructions: Gently cleanse wound with antibacterial soap, rinse and pat dry prior to dressing wounds Prim Dressing: Silvercel 4 1/4x 4 1/4 (in/in) (DME) (Generic) 3 x Per Week/30 Days ary Discharge Instructions: Apply Silvercel 4 1/4x 4 1/4 (in/in) as instructed Secondary Dressing: ABD Pad 5x9 (in/in) (DME) (Generic) 3 x Per Week/30 Days Discharge Instructions: Cover with ABD pad Secured With: Medipore T - 11M Medipore H Soft Cloth Surgical T ape ape, 2x2 (in/yd) (DME) (Generic) 3 x Per Week/30 Days Secured With: [employer] Sterile or Non-Sterile 6-ply 4.5x4 (yd/yd) (DME) (Generic) 3 x Per Week/30 Days Discharge Instructions: Apply Kerlix as directed Wound #4 - Ankle Wound Laterality: Right, Lateral Cleanser: Byram  Ancillary Kit - 15 Day Supply (DME) (Generic) 3 x Per Week/30 Days Discharge Instructions: Use supplies as instructed; Kit contains: (15) Saline Bullets; (15) 3x3  Gauze; 15 pr Gloves Cleanser: Soap and Water 3 x Per Week/30 Days Discharge Instructions: Gently cleanse wound with antibacterial soap, rinse and pat dry prior to dressing wounds Prim Dressing: Silvercel 4 1/4x 4 1/4 (in/in) (DME) (Generic) 3 x Per Week/30 Days ary Discharge Instructions: Apply Silvercel 4 1/4x 4 1/4 (in/in) as instructed Secondary Dressing: ABD Pad 5x9 (in/in) (DME) (Generic) 3 x Per Week/30 Days Discharge Instructions: Cover with ABD pad Secured With: Medipore T - 1054M Medipore H Soft Cloth Surgical T ape ape, 2x2 (in/yd) (DME) (Generic) 3 x Per Week/30 Days Secured With: [employer] Sterile or Non-Sterile 6-ply 4.5x4 (yd/yd) (DME) (Generic) 3 x Per Week/30 Days Discharge Instructions: Apply Kerlix as directed Wound #5 - T Great oe Wound Laterality: Right Cleanser: Byram Ancillary Kit - 15 Day Supply (DME) (Generic) 3 x Per Week/30 Days Discharge Instructions: Use supplies as instructed; Kit contains: (15) Saline Bullets; (15) 3x3 Gauze; 15 pr Gloves Cleanser: Soap and Water 3 x Per Week/30 Days Discharge Instructions: Gently cleanse wound with antibacterial soap, rinse and pat dry prior to dressing wounds Prim Dressing: Silvercel 4 1/4x 4 1/4 (in/in) (DME) (Generic) 3 x Per Week/30 Days ary Discharge Instructions: Apply Silvercel 4 1/4x 4 1/4 (in/in) as instructed Secondary Dressing: ABD Pad 5x9 (in/in) (DME) (Generic) 3 x Per Week/30 Days Discharge Instructions: Cover with ABD pad Secured With: Medipore T - 1054M Medipore H Soft Cloth Surgical T ape ape, 2x2 (in/yd) (DME) (Generic) 3 x Per Week/30 Days Secured With: [employer] Sterile or Non-Sterile 6-ply 4.5x4 (yd/yd) (DME) (Generic) 3 x Per Week/30 Days Discharge Instructions: Apply Kerlix as directed Wound #6 - T Second oe Wound Laterality: Right Cleanser: Byram Ancillary Kit - 15 Day Supply (DME) (Generic) 3 x Per Week/30 Days Discharge Instructions: Use supplies as instructed; Kit contains: (15) Saline Bullets;  (15) 3x3 Gauze; 15 pr Gloves Cleanser: Soap and Water 3 x Per Week/30 Days Discharge Instructions: Gently cleanse wound with antibacterial soap, rinse and pat dry prior to dressing wounds Prim Dressing: Silvercel 4 1/4x 4 1/4 (in/in) (DME) (Generic) 3 x Per Week/30 Days ary Discharge Instructions: Apply Silvercel 4 1/4x 4 1/4 (in/in) as instructed Secondary Dressing: ABD Pad 5x9 (in/in) (DME) (Generic) 3 x Per Week/30 Days VED, MACKELLAR ([id number]) [phone].pdf Page 10 of 17 Discharge Instructions: Cover with ABD pad Secured With: Medipore T - 1054M Medipore H Soft Cloth Surgical T ape ape, 2x2 (in/yd) (DME) (Generic) 3 x Per Week/30 Days Secured With: [employer] Sterile or Non-Sterile 6-ply 4.5x4 (yd/yd) (DME) (Generic) 3 x Per Week/30 Days Discharge Instructions: Apply Kerlix as directed Wound #7 - Lower Leg Wound Laterality: Right, Medial Cleanser: Byram Ancillary Kit - 15 Day Supply (DME) (Generic) 3 x Per Week/30 Days Discharge Instructions: Use supplies as instructed; Kit contains: (15) Saline Bullets; (15) 3x3 Gauze; 15 pr Gloves Cleanser: Soap and Water 3 x Per Week/30 Days Discharge Instructions: Gently cleanse wound with antibacterial soap, rinse and pat dry prior to dressing wounds Prim Dressing: Silvercel 4 1/4x 4 1/4 (in/in) (DME) (Generic) 3 x Per Week/30 Days ary Discharge Instructions: Apply Silvercel 4 1/4x 4 1/4 (in/in) as instructed Secondary Dressing: ABD Pad 5x9 (in/in) (DME) (Generic) 3 x Per Week/30 Days Discharge Instructions: Cover with ABD pad Secured With: Medipore T - 1054M Medipore H Soft Cloth Surgical T ape ape,  2x2 (in/yd) (DME) (Generic) 3 x Per Week/30 Days Secured With: Kerlix Roll Sterile or Non-Sterile 6-ply 4.5x4 (yd/yd) (DME) (Generic) 3 x Per Week/30 Days Discharge Instructions: Apply Kerlix as directed Wound #8 - T Great oe Wound Laterality: Left Cleanser: Byram Ancillary Kit - 15 Day Supply (DME) (Generic) 3 x Per Week/30  Days Discharge Instructions: Use supplies as instructed; Kit contains: (15) Saline Bullets; (15) 3x3 Gauze; 15 pr Gloves Cleanser: Soap and Water 3 x Per Week/30 Days Discharge Instructions: Gently cleanse wound with antibacterial soap, rinse and pat dry prior to dressing wounds Prim Dressing: Silvercel 4 1/4x 4 1/4 (in/in) (DME) (Generic) 3 x Per Week/30 Days ary Discharge Instructions: Apply Silvercel 4 1/4x 4 1/4 (in/in) as instructed Secondary Dressing: ABD Pad 5x9 (in/in) (DME) (Generic) 3 x Per Week/30 Days Discharge Instructions: Cover with ABD pad Secured With: Medipore T - 82M Medipore H Soft Cloth Surgical T ape ape, 2x2 (in/yd) (DME) (Generic) 3 x Per Week/30 Days Secured With: [employer] Sterile or Non-Sterile 6-ply 4.5x4 (yd/yd) (DME) (Generic) 3 x Per Week/30 Days Discharge Instructions: Apply Kerlix as directed Wound #9 - Foot Wound Laterality: Dorsal, Left Cleanser: Byram Ancillary Kit - 15 Day Supply (DME) (Generic) 3 x Per Week/30 Days Discharge Instructions: Use supplies as instructed; Kit contains: (15) Saline Bullets; (15) 3x3 Gauze; 15 pr Gloves Cleanser: Soap and Water 3 x Per Week/30 Days Discharge Instructions: Gently cleanse wound with antibacterial soap, rinse and pat dry prior to dressing wounds Prim Dressing: Silvercel 4 1/4x 4 1/4 (in/in) (DME) (Generic) 3 x Per Week/30 Days ary Discharge Instructions: Apply Silvercel 4 1/4x 4 1/4 (in/in) as instructed Secondary Dressing: ABD Pad 5x9 (in/in) (DME) (Generic) 3 x Per Week/30 Days Discharge Instructions: Cover with ABD pad Secured With: Medipore T - 82M Medipore H Soft Cloth Surgical T ape ape, 2x2 (in/yd) (DME) (Generic) 3 x Per Week/30 Days Secured With: [employer] Sterile or Non-Sterile 6-ply 4.5x4 (yd/yd) (DME) (Generic) 3 x Per Week/30 Days Discharge Instructions: Apply Kerlix as directed Consults Vascular - ABI and TBI - (ICD10 E11.621 - Type 2 diabetes mellitus with foot ulcer) Laboratory Bacteria  identified in Wound by Culture (MICRO) - non healing wound right laterial ankle - (ICD10 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 06/24/2023 8:45 A M Medical Record Number: [id number] Patient Account Number: [id number] Date of Birth/Sex: Treating RN: 28-Dec-1970 (53 y.o. 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 06/24/2023 No Yes I89.0 Lymphedema, not elsewhere classified 06/24/2023 No Yes L97.312 Non-pressure chronic ulcer of right ankle with fat layer exposed 06/24/2023 No Yes L97.322 Non-pressure chronic ulcer of left ankle with fat layer exposed 06/24/2023 No Yes L97.812 Non-pressure chronic ulcer of other part of right lower leg with fat layer 06/24/2023  No Yes exposed I10 Essential (primary) hypertension 06/24/2023 No Yes Inactive Problems Resolved Problems Electronic Signature(s) Signed: 06/24/2023 9:39:14 AM By: Sollars Derry PA-C Entered By: Lippman Derry on 06/24/2023 09:39:14 SAFI, RYKER ([id number]) [phone].pdf  Page 12 of 17 -------------------------------------------------------------------------------- Progress Note Details Patient Name: Date of Service: Jeneen Rinks MES C. 06/24/2023 8:45 A M Medical Record Number: [id number] Patient Account Number: [id number] Date of Birth/Sex: Treating RN: Aug 15, 1970 (52 y.o. Judie Petit) [name] Primary Care Provider: Marletta Lor Other Clinician: Referring Provider: Treating Provider/Extender: Rupert Derry Self, Referral Weeks in Treatment: 0 Subjective Chief Complaint Information obtained from Patient Bilateral lower extremities and foot ulcers History of Present Illness (HPI) Chronic/Inactive Condition: 06-24-23 patient's arterial studies really have never been done in order for [place] to know how his blood flow is. He does have a cool foot to touch on the right compared to the left which has me concerned today and again we were unable to obtain any ABIs in the office. I do believe that he needs to have a formal arterial study with ABI and TBI to further evaluate the situation I fear that he may have critical limb ischemia on the right foot which may be part of the reason why he is having such significant pain. Patient has a history of diabetes mellitus type 2, lymphedema, hypertension, and he does have chronic wounds of lower extremities which have been present for several years. 09-26-2021 upon evaluation today patient presents today for evaluation of wounds on the right heel, left lower leg, and left fifth metatarsal head location more plantar. With that being said these wounds have been present for since around June 09, 2021. Currently the patient has had dressings on that were unsure exactly how long they have been in place he has legs that appear to be extremely dry and he had a lot of dead tissue and dry skin buildup. It does make me concerned about the fact that he has not been changing these dressings appropriately nor anywhere close to regularly. Currently with  regard to the wounds he does seem to be doing decently well all things considered. That is at least good news. I see no signs of obvious active infection at the moment. The patient's most recent ABI was on 05/31/2021 and was on the right 1.15 and left 1.02 there was no arterial study performed for completeness nor a TBI. This may be something to consider going forward. Patient has otherwise a history of diabetes mellitus type 2, chronic venous insufficiency, lymphedema, and hypertension. Readmission: 06-24-23 patient presents today for reevaluation here in the clinic concerning issues that he has been experiencing with regard to his bilateral lower extremities. This is a gentleman that I previously saw in 2023. It has been almost 2 years since the last time he was here in the clinic. Nonetheless he tells me he is continue to have issues with his legs unfortunately. He tells me that his feet and legs hurt so bad that he really does not let anybody touch them other than his wife and me. With that being said when I saw him in 2023 we were attempting to get him into vascular but he did not end up going to see them he tells me "that was my fault". With that being said he tells me that he needs to do what needs to be done in order to get this better at this point. He does have signs of significant infection in my opinion of the extremities bilaterally. I  believe that he likely has a Pseudomonas infection but it may not be the only thing. We can actually obtain a PCR culture today to send in and evaluate for what is showing up on this cultures I know how to best treat him from a bacteria standpoint. He does not really have any recent ABIs or TBI's unfortunately. That is one of the big things that we are getting need to do. Patient History Allergies morphine Social History Former smoker, Marital Status - Married, Alcohol Use - Never, Drug Use - No History, Caffeine Use - Daily. Medical  History Cardiovascular Patient has history of Hypertension Endocrine Patient has history of Type II Diabetes [address] DAMARIS, REHL ([id number]) [id number] Page 13 of 17 Constitutional patient is hypertensive.. pulse regular and within target range for patient.Marland Kitchen respirations regular, non-labored and within target range for patient.Marland Kitchen temperature within target range for patient.. Obese and well-hydrated in no acute distress. Vitals Time Taken: 8:58 AM, Height: 77 in, Source: Stated, Weight: 360 lbs, Source: Stated, BMI: 42.7, Temperature: 98.3 F, Pulse: 79 bpm, Respiratory Rate: 18 breaths/min, Blood Pressure: 152/85 mmHg. Eyes conjunctiva clear no eyelid edema noted. pupils equal round and reactive to light and accommodation. Ears, Nose, Mouth, and Throat no gross abnormality of ear auricles or external auditory canals. normal hearing noted during conversation. mucus membranes moist. Respiratory normal breathing without difficulty. Cardiovascular Absent posterior tibial and dorsalis pedis pulses bilateral lower extremities. 2+ pitting edema of the bilateral lower extremities. Musculoskeletal normal gait and posture. no significant deformity or arthritic changes, no loss or range of motion, no clubbing. Psychiatric this patient is able to make decisions and demonstrates good insight into disease process. Alert and Oriented x 3. pleasant and cooperative. General Notes: Upon inspection patient's wound bed actually showed signs of again having poor arterial flow signs in both feet although the right foot was worse than the left feeling very cold to touch and this has me very concerned. I believe that he may be bordering on critical limb ischemia and I discussed that with him today I think he needs to have an arterial study performed ASAP. I believe that his lymphedema is stage III and significant and if we do not get this under control and get him better arterial  flow he is at risk for amputation. Integumentary (Hair, Skin) Wound #10 status is Open. Original cause of wound was Gradually Appeared. The date acquired was: 11/08/2022. The wound is located on the Left,Lateral Ankle. The wound measures 1cm length x 2.5cm width x 0.2cm depth; 1.963cm^2 area and 0.393cm^3 volume. There is Fat Layer (Subcutaneous Tissue) exposed. There is no tunneling or undermining noted. There is a large amount of serosanguineous drainage noted. There is small (1-33%) pink granulation within the wound bed. There is a large (67-100%) amount of necrotic tissue within the wound bed including Adherent Slough. Wound #4 status is Open. Original cause of wound was Gradually Appeared. The date acquired was: 01/08/2023. The wound is located on the Right,Lateral Ankle. The wound measures 5cm length x 8cm width x 0.4cm depth; 31.416cm^2 area and 12.566cm^3 volume. There is no tunneling or undermining noted. There is a large amount of serosanguineous drainage noted. There is no granulation within the wound bed. There is a large (67-100%) amount of necrotic tissue within the wound bed including Adherent Slough. Wound #5 status is Open. Original cause of wound was Gradually Appeared. The date acquired was: 11/08/2022. The wound is located on the Right T Great. The oe wound measures  1.4cm length x 1.5cm width x 0.2cm depth; 1.649cm^2 area and 0.33cm^3 volume. There is Fat Layer (Subcutaneous Tissue) exposed. There is no tunneling or undermining noted. There is a large amount of serosanguineous drainage noted. There is small (1-33%) red granulation within the wound bed. There is a large (67-100%) amount of necrotic tissue within the wound bed including Adherent Slough. Wound #6 status is Open. Original cause of wound was Gradually Appeared. The date acquired was: 11/08/2022. The wound is located on the Right T Second. oe The wound measures 0.5cm length x 1cm width x 0.2cm depth; 0.393cm^2 area and  0.079cm^3 volume. There is Fat Layer (Subcutaneous Tissue) exposed. There is no tunneling or undermining noted. There is a large amount of serosanguineous drainage noted. There is small (1-33%) red granulation within the wound bed. There is a large (67-100%) amount of necrotic tissue within the wound bed including Adherent Slough. Wound #7 status is Open. Original cause of wound was Gradually Appeared. The date acquired was: 11/08/2022. The wound is located on the Right,Medial Lower Leg. The wound measures 3cm length x 3cm width x 0.1cm depth; 7.069cm^2 area and 0.707cm^3 volume. There is Fat Layer (Subcutaneous Tissue) exposed. There is no tunneling or undermining noted. There is a medium amount of serosanguineous drainage noted. There is large (67-100%) red granulation within the wound bed. There is no necrotic tissue within the wound bed. Wound #8 status is Open. Original cause of wound was Gradually Appeared. The date acquired was: 11/08/2022. The wound is located on the Left T Great. The oe wound measures 2cm length x 3cm width x 0.2cm depth; 4.712cm^2 area and 0.942cm^3 volume. There is Fat Layer (Subcutaneous Tissue) exposed. There is no tunneling or undermining noted. There is a large amount of serosanguineous drainage noted. There is small (1-33%) pink granulation within the wound bed. There is a large (67-100%) amount of necrotic tissue within the wound bed including Adherent Slough. Wound #9 status is Open. Original cause of wound was Gradually Appeared. The date acquired was: 11/08/2022. The wound is located on the Left,Dorsal Foot. The wound measures 8cm length x 4cm width x 0.3cm depth; 25.133cm^2 area and 7.54cm^3 volume. There is Fat Layer (Subcutaneous Tissue) exposed. There is no tunneling or undermining noted. There is a large amount of serosanguineous drainage noted. There is small (1-33%) pink granulation within the wound bed. There is a large (67-100%) amount of necrotic tissue within  the wound bed including Adherent Slough. 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 There was a Chemical/Enzymatic/Mechanical debridement performed by Lamarre Derry, PA-C. With the following instrument(s): saline gauze. Other agent used was saline gauze. There was no bleeding. The procedure was tolerated well with a pain level of 0 throughout and a pain level of 0 following the procedure. Post Debridement Measurements: 1cm length x 2.5cm width x 0.2cm depth; 0.393cm^3 volume. Character of  Wound/Ulcer Post Debridement requires further debridement. Severity of Tissue Post Debridement is: Fat layer exposed. Post procedure Diagnosis Wound #10: Same as Pre-Procedure Wound #4 Pre-procedure diagnosis of Wound #4 is a Diabetic Wound/Ulcer of the Lower Extremity located on the Right,Lateral Ankle .Severity of Tissue Pre Debridement is: Fat layer exposed. There was a Chemical/Enzymatic/Mechanical debridement performed by Bakula Derry, PA-C. With the following instrument(s): saline gauze. Other agent used was saline gauze. There was no bleeding. The procedure was tolerated well with a pain level of 0 throughout and a pain level of 0 following the procedure. Post  Debridement Measurements: 5cm length x 8cm width x 0.4cm depth; 12.566cm^3 volume. Character of Wound/Ulcer Post Debridement requires further debridement. Severity of Tissue Post Debridement is: Fat layer exposed. Post procedure Diagnosis Wound #4: Same as Pre-Procedure Wound #5 Pre-procedure diagnosis of Wound #5 is a Diabetic Wound/Ulcer of the Lower Extremity located on the Right T Great .Severity of Tissue Pre Debridement is: oe Fat layer exposed. There was a Chemical/Enzymatic/Mechanical debridement performed by Saur Derry, PA-C. With the following instrument(s): saline gauze. Other agent used was saline gauze. There was no bleeding. The procedure was tolerated well with a pain level of 0 throughout and a pain level of 0 following the procedure. Post Debridement Measurements: 1.4cm length x 1.5cm width x 0.2cm depth; 0.33cm^3 volume. Character of Wound/Ulcer Post Debridement requires further debridement. Severity of Tissue Post Debridement is: Fat layer exposed. Post procedure Diagnosis Wound #5: Same as Pre-Procedure Wound #6 Pre-procedure diagnosis of Wound #6 is a Diabetic Wound/Ulcer of the Lower Extremity located on the Right T Second .Severity of Tissue Pre Debridement oe is: Fat layer exposed. There was a Chemical/Enzymatic/Mechanical debridement performed by Weger Derry, PA-C. With the following instrument(s): saline gauze. Other agent used was saline gauze. There was no bleeding. The procedure was tolerated well with a pain level of 0 throughout and a pain level of 0 following the procedure. Post Debridement Measurements: 0.5cm length x 1cm width x 0.2cm depth; 0.079cm^3 volume. Character of Wound/Ulcer Post Debridement requires further debridement. Severity of Tissue Post Debridement is: Fat layer exposed. Post procedure Diagnosis Wound #6: Same as Pre-Procedure Wound #7 Pre-procedure diagnosis of Wound #7 is a Diabetic Wound/Ulcer of the Lower Extremity located on the  Right,Medial Lower Leg .Severity of Tissue Pre Debridement is: Fat layer exposed. There was a Chemical/Enzymatic/Mechanical debridement performed by Cull Derry, PA-C. With the following instrument(s): saline gauze. Other agent used was saline gauze. There was no bleeding. The procedure was tolerated well with a pain level of 0 throughout and a pain level of 0 following the procedure. Post Debridement Measurements: 3cm length x 3cm width x 0.1cm depth; 0.707cm^3 volume. Character of Wound/Ulcer Post Debridement requires further debridement. Severity of Tissue Post Debridement is: Fat layer exposed. Post procedure Diagnosis Wound #7: Same as Pre-Procedure Wound #8 Pre-procedure diagnosis of Wound #8 is a Diabetic Wound/Ulcer of the Lower Extremity located on the Left T Great .Severity of Tissue Pre Debridement is: oe Fat layer exposed. There was a Chemical/Enzymatic/Mechanical debridement performed by All Derry, PA-C. With the following instrument(s): saline gauze. Other agent used was saline gauze. There was no bleeding. The procedure was tolerated well with a pain level of 0 throughout and a pain level of 0 following the procedure. Post Debridement Measurements: 2cm length x 3cm width x 0.2cm depth; 0.942cm^3 volume. Character of Wound/Ulcer Post Debridement requires further debridement. Severity of Tissue Post Debridement is: Fat layer exposed. Post procedure Diagnosis Wound #8: Same  as Pre-Procedure Wound #9 Pre-procedure diagnosis of Wound #9 is a Diabetic Wound/Ulcer of the Lower Extremity located on the Left,Dorsal Foot .Severity of Tissue Pre Debridement is: Fat layer exposed. There was a Chemical/Enzymatic/Mechanical debridement performed by Roddey Derry, PA-C. With the following instrument(s): saline gauze. Other agent used was saline gauze. There was no bleeding. The procedure was tolerated well with a pain level of 0 throughout and a pain level of 0 following the procedure. Post  Debridement Measurements: 8cm length x 4cm width x 0.3cm depth; 7.54cm^3 volume. Character of Wound/Ulcer Post Debridement requires further debridement. Severity of Tissue Post Debridement is: Fat layer exposed. Post procedure Diagnosis Wound #9: Same as Pre-Procedure Plan Follow-up Appointments: Return Appointment in 1 week. Bathing/ Shower/ Hygiene: May shower; gently cleanse wound with antibacterial soap, rinse and pat dry prior to dressing wounds Anesthetic (Use 'Patient Medications' Section for Anesthetic Order Entry): Lidocaine applied to wound bed Edema Control - Orders / Instructions: Elevate, Exercise Daily and Avoid Standing for Long Periods of Time. Elevate legs to the level of the heart and pump ankles as often as possible Elevate leg(s) parallel to the floor when sitting. Laboratory ordered were: Wound culture routine - non healing wound right laterial ankle Consults ordered were: Vascular - ABI and TBI WOUND #10: - Ankle Wound Laterality: Left, Lateral Cleanser: [employer] Ancillary Kit - 15 Day Supply (DME) (Generic) 3 x Per Week/30 Days Discharge Instructions: Use supplies as instructed; Kit contains: (15) Saline Bullets; (15) 3x3 Gauze; 15 pr Gloves Cleanser: Soap and Water 3 x Per Week/30 Days Discharge Instructions: Gently cleanse wound with antibacterial soap, rinse and pat dry prior to dressing wounds Prim Dressing: Silvercel 4 1/4x 4 1/4 (in/in) (DME) (Generic) 3 x Per Week/30 Days HASSEL, TETTERTON ([id number]) [phone].pdf Page 15 of 17 Discharge Instructions: Apply Silvercel 4 1/4x 4 1/4 (in/in) as instructed Secondary Dressing: ABD Pad 5x9 (in/in) (DME) (Generic) 3 x Per Week/30 Days Discharge Instructions: Cover with ABD pad Secured With: Medipore T - 2M Medipore H Soft Cloth Surgical T ape ape, 2x2 (in/yd) (DME) (Generic) 3 x Per Week/30 Days Secured With: [employer] Sterile or Non-Sterile 6-ply 4.5x4 (yd/yd) (DME) (Generic) 3 x Per  Week/30 Days Discharge Instructions: Apply Kerlix as directed WOUND #4: - Ankle Wound Laterality: Right, Lateral Cleanser: [employer] Ancillary Kit - 15 Day Supply (DME) (Generic) 3 x Per Week/30 Days Discharge Instructions: Use supplies as instructed; Kit contains: (15) Saline Bullets; (15) 3x3 Gauze; 15 pr Gloves Cleanser: Soap and Water 3 x Per Week/30 Days Discharge Instructions: Gently cleanse wound with antibacterial soap, rinse and pat dry prior to dressing wounds Prim Dressing: Silvercel 4 1/4x 4 1/4 (in/in) (DME) (Generic) 3 x Per Week/30 Days ary Discharge Instructions: Apply Silvercel 4 1/4x 4 1/4 (in/in) as instructed Secondary Dressing: ABD Pad 5x9 (in/in) (DME) (Generic) 3 x Per Week/30 Days Discharge Instructions: Cover with ABD pad Secured With: Medipore T - 2M Medipore H Soft Cloth Surgical T ape ape, 2x2 (in/yd) (DME) (Generic) 3 x Per Week/30 Days Secured With: [employer] Sterile or Non-Sterile 6-ply 4.5x4 (yd/yd) (DME) (Generic) 3 x Per Week/30 Days Discharge Instructions: Apply Kerlix as directed WOUND #5: - T Great Wound Laterality: Right oe Cleanser: [employer] Ancillary Kit - 15 Day Supply (DME) (Generic) 3 x Per Week/30 Days Discharge Instructions: Use supplies as instructed; Kit contains: (15) Saline Bullets; (15) 3x3 Gauze; 15 pr Gloves Cleanser: Soap and Water 3 x Per Week/30 Days Discharge Instructions: Gently cleanse wound with antibacterial  soap, rinse and pat dry prior to dressing wounds Prim Dressing: Silvercel 4 1/4x 4 1/4 (in/in) (DME) (Generic) 3 x Per Week/30 Days ary Discharge Instructions: Apply Silvercel 4 1/4x 4 1/4 (in/in) as instructed Secondary Dressing: ABD Pad 5x9 (in/in) (DME) (Generic) 3 x Per Week/30 Days Discharge Instructions: Cover with ABD pad Secured With: Medipore T - 45M Medipore H Soft Cloth Surgical T ape ape, 2x2 (in/yd) (DME) (Generic) 3 x Per Week/30 Days Secured With: [employer] Sterile or Non-Sterile 6-ply 4.5x4 (yd/yd) (DME)  (Generic) 3 x Per Week/30 Days Discharge Instructions: Apply Kerlix as directed WOUND #6: - T Second Wound Laterality: Right oe Cleanser: Byram Ancillary Kit - 15 Day Supply (DME) (Generic) 3 x Per Week/30 Days Discharge Instructions: Use supplies as instructed; Kit contains: (15) Saline Bullets; (15) 3x3 Gauze; 15 pr Gloves Cleanser: Soap and Water 3 x Per Week/30 Days Discharge Instructions: Gently cleanse wound with antibacterial soap, rinse and pat dry prior to dressing wounds Prim Dressing: Silvercel 4 1/4x 4 1/4 (in/in) (DME) (Generic) 3 x Per Week/30 Days ary Discharge Instructions: Apply Silvercel 4 1/4x 4 1/4 (in/in) as instructed Secondary Dressing: ABD Pad 5x9 (in/in) (DME) (Generic) 3 x Per Week/30 Days Discharge Instructions: Cover with ABD pad Secured With: Medipore T - 45M Medipore H Soft Cloth Surgical T ape ape, 2x2 (in/yd) (DME) (Generic) 3 x Per Week/30 Days Secured With: [employer] Sterile or Non-Sterile 6-ply 4.5x4 (yd/yd) (DME) (Generic) 3 x Per Week/30 Days Discharge Instructions: Apply Kerlix as directed WOUND #7: - Lower Leg Wound Laterality: Right, Medial Cleanser: Byram Ancillary Kit - 15 Day Supply (DME) (Generic) 3 x Per Week/30 Days Discharge Instructions: Use supplies as instructed; Kit contains: (15) Saline Bullets; (15) 3x3 Gauze; 15 pr Gloves Cleanser: Soap and Water 3 x Per Week/30 Days Discharge Instructions: Gently cleanse wound with antibacterial soap, rinse and pat dry prior to dressing wounds Prim Dressing: Silvercel 4 1/4x 4 1/4 (in/in) (DME) (Generic) 3 x Per Week/30 Days ary Discharge Instructions: Apply Silvercel 4 1/4x 4 1/4 (in/in) as instructed Secondary Dressing: ABD Pad 5x9 (in/in) (DME) (Generic) 3 x Per Week/30 Days Discharge Instructions: Cover with ABD pad Secured With: Medipore T - 45M Medipore H Soft Cloth Surgical T ape ape, 2x2 (in/yd) (DME) (Generic) 3 x Per Week/30 Days Secured With: [employer] Sterile or Non-Sterile 6-ply 4.5x4  (yd/yd) (DME) (Generic) 3 x Per Week/30 Days Discharge Instructions: Apply Kerlix as directed WOUND #8: - T Great Wound Laterality: Left oe Cleanser: Byram Ancillary Kit - 15 Day Supply (DME) (Generic) 3 x Per Week/30 Days Discharge Instructions: Use supplies as instructed; Kit contains: (15) Saline Bullets; (15) 3x3 Gauze; 15 pr Gloves Cleanser: Soap and Water 3 x Per Week/30 Days Discharge Instructions: Gently cleanse wound with antibacterial soap, rinse and pat dry prior to dressing wounds Prim Dressing: Silvercel 4 1/4x 4 1/4 (in/in) (DME) (Generic) 3 x Per Week/30 Days ary Discharge Instructions: Apply Silvercel 4 1/4x 4 1/4 (in/in) as instructed Secondary Dressing: ABD Pad 5x9 (in/in) (DME) (Generic) 3 x Per Week/30 Days Discharge Instructions: Cover with ABD pad Secured With: Medipore T - 45M Medipore H Soft Cloth Surgical T ape ape, 2x2 (in/yd) (DME) (Generic) 3 x Per Week/30 Days Secured With: [employer] Sterile or Non-Sterile 6-ply 4.5x4 (yd/yd) (DME) (Generic) 3 x Per Week/30 Days Discharge Instructions: Apply Kerlix as directed WOUND #9: - Foot Wound Laterality: Dorsal, Left Cleanser: Byram Ancillary Kit - 15 Day Supply (DME) (Generic) 3 x Per Week/30 Days  Discharge Instructions: Use supplies as instructed; Kit contains: (15) Saline Bullets; (15) 3x3 Gauze; 15 pr Gloves Cleanser: Soap and Water 3 x Per Week/30 Days Discharge Instructions: Gently cleanse wound with antibacterial soap, rinse and pat dry prior to dressing wounds Prim Dressing: Silvercel 4 1/4x 4 1/4 (in/in) (DME) (Generic) 3 x Per Week/30 Days ary Discharge Instructions: Apply Silvercel 4 1/4x 4 1/4 (in/in) as instructed Secondary Dressing: ABD Pad 5x9 (in/in) (DME) (Generic) 3 x Per Week/30 Days Discharge Instructions: Cover with ABD pad Secured With: Medipore T - 90M Medipore H Soft Cloth Surgical T ape ape, 2x2 (in/yd) (DME) (Generic) 3 x Per Week/30 Days Secured With: [employer] Sterile or Non-Sterile 6-ply  4.5x4 (yd/yd) (DME) (Generic) 3 x Per Week/30 Days Discharge Instructions: Apply Kerlix as directed 1. Based on what I am seeing I am going to recommend right now that we go ahead and make a referral soon as possible to vascular for further evaluation of his legs. The patient is in agreement with the plan. 2. I would recommend that we initiate treatment with a silver alginate dressing followed by ABD pads and roll gauze to secure in place with Tubigrip size E single-layer. I do not want anything too strong but I do want help some from a compression standpoint. 3. I am also can recommend the patient should continue with an elevation of his legs much as possible I discussed with him that this is still the best thing he can do it does hurt when he elevates I think this is some symptom and sign of claudication but at the same time I think it is necessary to a degree at least. He voiced understanding. 4. I am good obtain a wound culture and depending on the results of wound culture will make any adjustments in care as necessary going forward. We will see patient back for reevaluation in 1 week here in the clinic. If anything worsens or changes patient will contact our office for additional recommendations. TRASK, WNEK ([id number]) [id number] Page 16 of 17 Electronic Signature(s) Signed: 06/24/2023 6:13:31 PM By: Mayeda Derry PA-C Entered By: Sanborn Derry on 06/24/2023 18:13:31 -------------------------------------------------------------------------------- ROS/PFSH Details Patient Name: Date of Service: Jeneen Rinks MES C. 06/24/2023 8:45 A M Medical Record Number: [id number] Patient Account Number: [id number] Date of Birth/Sex: Treating RN: June 17, 1970 (52 y.o. [name] Primary Care Provider: Marletta Lor Other Clinician: Referring Provider: Treating Provider/Extender: Alemu Derry Self, Referral Weeks in Treatment: 0 Cardiovascular Medical History: Positive for:  Hypertension Endocrine Medical History: Positive for: Type II Diabetes Time with diabetes: 8 years Treated with: Insulin Immunizations Pneumococcal Vaccine: Received Pneumococcal Vaccination: No Implantable Devices Yes Family and Social History Former smoker; Marital Status - Married; Alcohol Use: Never; Drug Use: No History; Caffeine Use: Daily Social Determinants of Health (SDOH) 1. In the past 2 months, did you or others you live with eat smaller meals or skip meals because you didn't have money for foodo : No 2. Are you homeless or worried that you might be in the futureo : No 3. Do you have trouble paying for your utilities (gas, electricity, phone)o : No 4. Do you have trouble finding or paying for a rideo : No 5. Do you need daycare, or better daycare, for your kidso : No 6. Are you unemployed or without regular incomeo : No 7. Do you need help finding a better jobo : No 8. Do you need help getting more educationo : No 9. Are you concerned about  someone in your home using drugs or alcoholo : No 10. Do you feel unsafe in your daily lifeo : No 11. Is anyone in your home threatening or abusing youo : No 12. Do you lack quality relationships that make you feel valued and supportedo : No 13. Do you need help getting cultural information in a language you understando : No 14. Do you need help getting internet accesso : No Advanced Directives and Instructions Spiritual or Cultural beliefs preclude asking about Advance Care Planning: No Advanced Directives: No Patient wants information on Advanced Directives: No Do not resuscitate: No Living Will: No Medical Power of Attorney: No Surrogate Decision Maker: No PIERINO, TOMLINS ([id number]) [id number] Page 17 of 17 Electronic Signature(s) Signed: 06/25/2023 6:05:15 PM By: Sobczyk Derry PA-C Signed: 06/26/2023 8:18:57 AM By: [name] RN Entered By: [name] on 06/24/2023  09:00:41 -------------------------------------------------------------------------------- SuperBill Details Patient Name: Date of Service: Jeneen Rinks MES C. 06/24/2023 Medical Record Number: [id number] Patient Account Number: [id number] Date of Birth/Sex: Treating RN: 10-21-1970 (53 y.o. 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 I89.0 Lymphedema, not elsewhere classified L97.312 Non-pressure chronic ulcer of right ankle with fat layer exposed L97.322 Non-pressure chronic ulcer of left ankle with fat layer exposed L97.812 Non-pressure chronic ulcer of other part of right lower leg with fat layer exposed I10 Essential (primary) hypertension Facility Procedures : CPT4 Code: 78295621 Description: 30865 - WOUND CARE VISIT-LEV 5 EST PT Modifier: Quantity: 1 Physician Procedures : CPT4: Description Modifier Code [id number] - WC PHYS LEVEL 4 - EST PT ICD-10 Diagnosis Description E11.621 Type 2 diabetes mellitus with foot ulcer L97.522 Non-pressure chronic ulcer of other part of left foot with fat layer exposed L97.412  Non-pressure chronic ulcer of right heel and midfoot with fat layer exposed I89.0 Lymphedema, not elsewhere classified Quantity: 1 : CPT4: X5284 Visit complexity inherent to [facility] w/medical care services that serve as the continuing focal point for ongoing care related to a patient's condition ICD-10 Diagnosis Description E11.621 Type 2 diabetes mellitus with foot ulcer L97.522  Non-pressure chronic ulcer of other part of left foot with fat layer exposed L97.412 Non-pressure chronic  ulcer of right heel and midfoot with fat layer exposed I89.0 Lymphedema, not elsewhere classified Quantity: 1 Electronic Signature(s) Signed: 06/24/2023 6:13:57 PM By: Dolberry Derry PA-C Previous Signature: 06/24/2023 12:36:02 PM Version By: [name] RN Entered By: Texidor Derry on 06/24/2023 18:13:57

## 2023-07-01 ENCOUNTER — Ambulatory Visit: Payer: Medicare Other | Admitting: Physician Assistant

## 2023-07-08 ENCOUNTER — Other Ambulatory Visit (INDEPENDENT_AMBULATORY_CARE_PROVIDER_SITE_OTHER): Payer: Self-pay | Admitting: Physician Assistant

## 2023-07-08 ENCOUNTER — Ambulatory Visit: Payer: Medicare Other | Admitting: Physician Assistant

## 2023-07-08 DIAGNOSIS — L97522 Non-pressure chronic ulcer of other part of left foot with fat layer exposed: Secondary | ICD-10-CM

## 2023-07-08 DIAGNOSIS — L97412 Non-pressure chronic ulcer of right heel and midfoot with fat layer exposed: Secondary | ICD-10-CM

## 2023-07-08 DIAGNOSIS — E11621 Type 2 diabetes mellitus with foot ulcer: Secondary | ICD-10-CM

## 2023-07-09 ENCOUNTER — Ambulatory Visit (INDEPENDENT_AMBULATORY_CARE_PROVIDER_SITE_OTHER): Payer: Medicare Other

## 2023-07-09 DIAGNOSIS — L97412 Non-pressure chronic ulcer of right heel and midfoot with fat layer exposed: Secondary | ICD-10-CM

## 2023-07-09 DIAGNOSIS — L97522 Non-pressure chronic ulcer of other part of left foot with fat layer exposed: Secondary | ICD-10-CM

## 2023-07-09 DIAGNOSIS — M869 Osteomyelitis, unspecified: Secondary | ICD-10-CM

## 2023-07-09 DIAGNOSIS — E11621 Type 2 diabetes mellitus with foot ulcer: Secondary | ICD-10-CM | POA: Diagnosis not present

## 2023-07-09 DIAGNOSIS — L97509 Non-pressure chronic ulcer of other part of unspecified foot with unspecified severity: Secondary | ICD-10-CM

## 2023-07-09 DIAGNOSIS — E1169 Type 2 diabetes mellitus with other specified complication: Secondary | ICD-10-CM

## 2023-07-15 ENCOUNTER — Ambulatory Visit: Payer: Medicare Other | Admitting: Physician Assistant

## 2023-07-20 ENCOUNTER — Ambulatory Visit: Payer: Medicare Other | Admitting: Physician Assistant

## 2023-10-20 ENCOUNTER — Encounter (INDEPENDENT_AMBULATORY_CARE_PROVIDER_SITE_OTHER): Payer: Self-pay | Admitting: Vascular Surgery

## 2023-10-20 ENCOUNTER — Ambulatory Visit (INDEPENDENT_AMBULATORY_CARE_PROVIDER_SITE_OTHER): Admitting: Vascular Surgery

## 2023-10-20 VITALS — BP 121/72 | HR 121 | Resp 22 | Ht 77.0 in | Wt >= 6400 oz

## 2023-10-20 DIAGNOSIS — I10 Essential (primary) hypertension: Secondary | ICD-10-CM | POA: Insufficient documentation

## 2023-10-20 DIAGNOSIS — E7800 Pure hypercholesterolemia, unspecified: Secondary | ICD-10-CM

## 2023-10-20 DIAGNOSIS — E11610 Type 2 diabetes mellitus with diabetic neuropathic arthropathy: Secondary | ICD-10-CM

## 2023-10-20 DIAGNOSIS — Z794 Long term (current) use of insulin: Secondary | ICD-10-CM

## 2023-10-20 DIAGNOSIS — I7025 Atherosclerosis of native arteries of other extremities with ulceration: Secondary | ICD-10-CM | POA: Diagnosis not present

## 2023-10-20 NOTE — Assessment & Plan Note (Signed)
 Patient has nonhealing ulcerations.  He had limited noninvasive studies performed a few months ago which had very poor waveforms distally concerning for significant disease.  These were limited due to his swelling and wraps.  Patient has nonhealing ulceration and this is a critical and limb threatening situation.  Discussed with the patient the neck step would generally be angiography with possible revascularization at that time if appropriate anatomy is encountered and significant disease is present that requires revascularization.  Risks and benefits of the procedure were discussed.  Patient voices understanding.

## 2023-10-20 NOTE — Patient Instructions (Signed)
Angiogram  An angiogram is a procedure used to examine the blood vessels. In this procedure, contrast dye is injected through a soft tube (catheter) into an artery. X-rays are then taken, which show if there is a blockage or problem in a blood vessel. The catheter may be inserted in: The groin area. This is the most common. The fold of the arm, near the elbow. The wrist. Tell a health care provider about: Any allergies you have, including allergies to medicines, shellfish, contrast dye, or iodine. All medicines you are taking, including vitamins, herbs, eye drops, creams, and over-the-counter medicines. Any problems you or family members have had with anesthesia. Any bleeding problems you have. Any surgeries you have had. Any medical conditions you have or have had, including any kidney problems or kidney failure. Whether you are pregnant or may be pregnant. Whether you are breastfeeding. Any condition that may increase your stress and prevent you from lying still. This includes anxiety disorders or chronic pain. What are the risks? Your health care provider will talk with you about risks. These may include: Infection. Bleeding and bruising. Allergic reactions to medicines or dyes, including the contrast dye used. Damage to nearby structures or organs, including damage to blood vessels and kidney damage from the contrast dye. Blood clots that can lead to a stroke or heart attack. Death. What happens before the procedure? When to stop eating and drinking Follow instructions from your health care provider about what you may eat and drink before your procedure. These may include: 8 hours before your procedure Stop eating most foods. Do not eat meat, fried foods, or fatty foods. Eat only light foods, such as toast or crackers. All liquids are okay except energy drinks and alcohol. 6 hours before your procedure Stop eating. Drink only clear liquids, such as water, clear fruit juice,  black coffee, plain tea, and sports drinks. Do not drink energy drinks or alcohol. 2 hours before your procedure Stop drinking all liquids. You may be allowed to take medicines with small sips of water. If you do not follow your health care provider's instructions, your procedure may be delayed or canceled. Medicines Ask your health care provider about: Changing or stopping your regular medicines. These include any diabetes medicines or blood thinners you take. Taking medicines such as aspirin and ibuprofen. These medicines can thin your blood. Do not take them unless your health care provider tells you to. Taking over-the-counter medicines, vitamins, herbs, and supplements. Surgery safety Ask your health care provider: How your insertion site will be marked. What steps will be taken to help prevent infection. These may include: Removing hair at the insertion site. Washing skin with a germ-killing soap. General instructions Do not use any products that contain nicotine or tobacco for at least 4 weeks before the procedure. These products include cigarettes, chewing tobacco, and vaping devices, such as e-cigarettes. If you need help quitting, ask your health care provider. You may have blood samples taken. If you will be going home right after the procedure, plan to have a responsible adult: Take you home from the hospital or clinic. You will not be allowed to drive. Care for you for the time you are told. What happens during the procedure? You will lie on your back on an X-ray table. You may be strapped to the table if it is tilted. An IV will be inserted into one of your veins. Electrodes may be placed on your chest to monitor your heart rate during the procedure.  You will be given one or both of the following: A sedative. This helps you relax. Anesthesia. This will numb the area where the catheter will be inserted. A small incision will be made for catheter insertion. The catheter  will be inserted into an artery using a guide wire. An X-ray procedure (fluoroscopy) will be used to help guide the catheter to the blood vessel to be examined. A contrast dye will then be injected into the catheter, and X-rays will be taken. The contrast will help to show where any narrowing or blockages are located in the blood vessels. You may feel flushed as the contrast dye is injected. Tell your health care provider if you develop chest pain or trouble breathing. After the fluoroscopy is complete, the catheter will be removed. Pressure will be applied to stop bleeding. A closure device may also be used. A bandage (dressing) will be placed over the site where the catheter was inserted. The procedure may vary among health care providers and hospitals. What happens after the procedure? Your blood pressure, heart rate, breathing rate, and blood oxygen level will be monitored until you leave the hospital or clinic. You will be kept in bed lying flat for a period of time. If the catheter was inserted through your leg, you will be instructed not to bend or cross your legs. The insertion area and the pulse in your feet or wrist will be checked often. You will be instructed to drink plenty of fluids. This will help wash the contrast dye out of your body. You may have more blood tests and X-rays. You may also have a test that records the electrical activity of your heart (electrocardiogram, or ECG). If you were given a sedative, do not drive or operate machinery until your health care provider says that it is safe. You may have to avoid lifting. Ask your health care provider how much you can safely lift. It is up to you to get your test results. Ask your health care provider, or the department that is doing the test, when your results will be ready. This information is not intended to replace advice given to you by your health care provider. Make sure you discuss any questions you have with your health  care provider. Document Revised: 12/18/2021 Document Reviewed: 12/18/2021 Elsevier Patient Education  2024 [employer].

## 2023-10-20 NOTE — Assessment & Plan Note (Signed)
 lipid control important in reducing the progression of atherosclerotic disease. Continue statin therapy

## 2023-10-20 NOTE — Assessment & Plan Note (Signed)
 blood pressure control important in reducing the progression of atherosclerotic disease. On appropriate oral medications.

## 2023-10-20 NOTE — Assessment & Plan Note (Signed)
 blood glucose control important in reducing the progression of atherosclerotic disease. Also, involved in wound healing. On appropriate medications.

## 2023-10-20 NOTE — Progress Notes (Signed)
 Patient ID: Martin Riddle, male   DOB: 10-15-1970, 53 y.o.   MRN: [id number]  Chief Complaint  Patient presents with   New Patient (Initial Visit)    NP. consult. abnormal abi. wound. stone.    HPI Martin Riddle is a 53 y.o. male.  I am asked to see the patient by Rosebud Confer for evaluation of non-healing ulcerations of both lower extremities.  He was studied with some noninvasive studies in our office a few months ago, but not seen by provider.  These were very limited studies due to his wraps and pain, but he had noncompressible vessels with poor waveforms distally worrisome for some degree of vascular disease.  He has had a negative DVT study but not a functional venous assessment previously as well.  He has been having on and off ulcerations now for years.  He has been referred before but has not yet seen a provider in our office as he had canceled previous appointments.  He is accompanied by several family members today.  He discusses his mother having significant vascular disease and losing the leg before she passed away.  He says no matter what they do to try to help the wounds, they will not heal.  His legs are very painful.  He is being treated with Lyrica  for neuropathy.  His legs have chronic swelling.  The right leg is more severely affected than the left   Past Medical History:  Diagnosis Date   Diabetes mellitus without complication (HCC)    Hypercholesteremia    Hypertension    Pancreatitis     Past Surgical History:  Procedure Laterality Date   ANKLE SURGERY Left 2001,2003   BACK SURGERY  [phone]     Family History  Problem Relation Age of Onset   Heart disease Mother    Diabetes Mother    Diabetes Sister   Mother had PAD and an amputation   Social History   Tobacco Use   Smoking status: Former    Types: Cigarettes   Smokeless tobacco: Current    Types: Designer, multimedia Use   Vaping status: Never Used  Substance Use Topics   Alcohol use: No     Alcohol/week: 0.0 standard drinks of alcohol   Drug use: No     Allergies  Allergen Reactions   Morphine Other (See Comments)    headache     A HEADACHE AND SWEATING AND LETHARGIC headache  headache, ,  A HEADACHE AND SWEATING AND LETHARGIC headache   Morphine And Codeine Other (See Comments)    headache   Other Other (See Comments)    A HEADACHE AND SWEATING AND LETHARGIC    headache  A HEADACHE AND SWEATING AND LETHARGIC, , headache    Current Outpatient Medications  Medication Sig Dispense Refill   acetaminophen  (TYLENOL ) 500 MG tablet Take 1,000 mg by mouth every 6 (six) hours as needed for mild pain or moderate pain.     atorvastatin  (LIPITOR) 20 MG tablet Take 1 tablet (20 mg total) by mouth daily. 90 tablet 1   busPIRone  (BUSPAR ) 15 MG tablet Take 15 mg by mouth 3 (three) times daily.     cetirizine (ZYRTEC) 10 MG tablet Take 10 mg by mouth daily.     cloNIDine  (CATAPRES ) 0.2 MG tablet Take 0.2 mg by mouth 3 (three) times daily.     DULoxetine  (CYMBALTA ) 60 MG capsule Take 60 mg by mouth daily.     insulin  NPH Human (  NOVOLIN N) 100 UNIT/ML injection Inject 0-50 Units into the skin 2 (two) times daily.     insulin  regular (NOVOLIN R) 100 units/mL injection Inject into the skin See admin instructions. Inject twice daily according to blood glucose reading     losartan  (COZAAR ) 100 MG tablet Take 100 mg by mouth.     losartan -hydrochlorothiazide  (HYZAAR) 100-25 MG tablet Take 1 tablet by mouth daily.     nitroGLYCERIN (NITROSTAT) 0.4 MG SL tablet Place under the tongue.     pregabalin  (LYRICA ) 25 MG capsule Take 1 capsule (25 mg total) by mouth 3 (three) times daily. 90 capsule 0   rOPINIRole (REQUIP) 1 MG tablet Take 1 mg by mouth.     gemfibrozil  (LOPID ) 600 MG tablet Take 1 tablet (600 mg total) by mouth 2 (two) times daily before a meal. 60 tablet 0   HUMALOG  100 UNIT/ML injection SMARTSIG:25 Unit(s) SUB-Q     LANTUS  100 UNIT/ML injection SMARTSIG:100 Unit(s) SUB-Q  Twice Daily     Multiple Vitamin (MULTIVITAMIN WITH MINERALS) TABS tablet Take 1 tablet by mouth daily. (Patient not taking: Reported on 10/20/2023) 90 tablet 1   naproxen sodium (ALEVE) 220 MG tablet Take 440 mg by mouth 2 (two) times daily as needed (pain). (Patient not taking: Reported on 10/20/2023)     polyethylene glycol (MIRALAX  / GLYCOLAX ) packet Take 17 g by mouth daily. (Patient not taking: Reported on 10/20/2023) 30 each 0   torsemide  (DEMADEX ) 20 MG tablet Take 1 tablet (20 mg total) by mouth daily. (Patient not taking: Reported on 10/20/2023) 30 tablet 1   No current facility-administered medications for this visit.      REVIEW OF SYSTEMS (Negative unless checked)  Constitutional: [] Weight loss  [] Fever  [] Chills Cardiac: [] Chest pain   [] Chest pressure   [] Palpitations   [] Shortness of breath when laying flat   [] Shortness of breath at rest   [] Shortness of breath with exertion. Vascular:  [x] Pain in legs with walking   [x] Pain in legs at rest   [] Pain in legs when laying flat   [] Claudication   [] Pain in feet when walking  [] Pain in feet at rest  [] Pain in feet when laying flat   [] History of DVT   [] Phlebitis   [x] Swelling in legs   [] Varicose veins   [x] Non-healing ulcers Pulmonary:   [] Uses home oxygen   [] Productive cough   [] Hemoptysis   [] Wheeze  [] COPD   [] Asthma Neurologic:  [] Dizziness  [] Blackouts   [] Seizures   [] History of stroke   [] History of TIA  [] Aphasia   [] Temporary blindness   [] Dysphagia   [] Weakness or numbness in arms   [] Weakness or numbness in legs Musculoskeletal:  [] Arthritis   [] Joint swelling   [x] Joint pain   [] Low back pain Hematologic:  [] Easy bruising  [] Easy bleeding   [] Hypercoagulable state   [] Anemic  [] Hepatitis Gastrointestinal:  [] Blood in stool   [] Vomiting blood  [] Gastroesophageal reflux/heartburn   [] Abdominal pain Genitourinary:  [] Chronic kidney disease   [] Difficult urination  [] Frequent urination  [] Burning with urination    [] Hematuria Skin:  [] Rashes   [x] Ulcers   [x] Wounds Psychological:  [] History of anxiety   []  History of major depression.    Physical Exam BP 121/72   Pulse (!) 121   Resp (!) 22   Ht 6\' 5"  (1.956 m)   Wt (!) 406 lb 9.6 oz (184.4 kg)   BMI 48.22 kg/m  Gen:  WD/WN, NAD. Obese  Head: [place]/AT, No temporalis wasting.  Ear/Nose/Throat: Hearing grossly intact, nares w/o erythema or drainage, oropharynx w/o Erythema/Exudate Eyes: Conjunctiva clear, sclera non-icteric  Neck: trachea midline.  No JVD.  Pulmonary:  Good air movement, respirations not labored, no use of accessory muscles  Cardiac: RRR, no JVD Vascular:  Vessel Right Left  Radial Palpable Palpable                          NP NP NP  PT NP NP   Gastrointestinal:. No masses, surgical incisions, or scars. Musculoskeletal: M/S 5/5 throughout.  Wounds are present on BLE with wraps from the wound clinic in place.  Moderate BLE edema. Neurologic: Sensation grossly intact in extremities.  Symmetrical.  Speech is fluent. Motor exam as listed above. Psychiatric: Judgment intact, Mood & affect appropriate for pt's clinical situation. Dermatologic: BLE wounds currently wrapped     Radiology No results found.  Labs No results found for this or any previous visit (from the past 2160 hours).  Assessment/Plan:  Atherosclerosis of native arteries of the extremities with ulceration (HCC) Patient has nonhealing ulcerations.  He had limited noninvasive studies performed a few months ago which had very poor waveforms distally concerning for significant disease.  These were limited due to his swelling and wraps.  Patient has nonhealing ulceration and this is a critical and limb threatening situation.  Discussed with the patient the neck step would generally be angiography with possible revascularization at that time if appropriate anatomy is encountered and significant disease is present that requires revascularization.  Risks and  benefits of the procedure were discussed.  Patient voices understanding.  Hypertension blood pressure control important in reducing the progression of atherosclerotic disease. On appropriate oral medications.   Diabetes mellitus, type 2 (HCC) blood glucose control important in reducing the progression of atherosclerotic disease. Also, involved in wound healing. On appropriate medications.   Hypercholesteremia lipid control important in reducing the progression of atherosclerotic disease. Continue statin therapy      Mikki Alexander 10/20/2023, 12:54 PM   This note was created with Dragon medical transcription system.  Any errors from dictation are unintentional.

## 2023-10-22 ENCOUNTER — Telehealth (INDEPENDENT_AMBULATORY_CARE_PROVIDER_SITE_OTHER): Payer: Self-pay

## 2023-10-22 NOTE — Telephone Encounter (Signed)
 I attempted to contact the to schedule bilateral leg angio's with Dr. Vonna Guardian. A message was left on the voicemail box of the mobile phone as the home phone number voicemail was full.

## 2023-10-26 ENCOUNTER — Emergency Department

## 2023-10-26 ENCOUNTER — Encounter: Payer: Self-pay | Admitting: Intensive Care

## 2023-10-26 ENCOUNTER — Inpatient Hospital Stay
Admission: EM | Admit: 2023-10-26 | Discharge: 2023-11-09 | DRG: 853 | Disposition: A | Discharge status: Skilled Nursing Facility | Attending: Internal Medicine | Admitting: Internal Medicine

## 2023-10-26 ENCOUNTER — Other Ambulatory Visit: Payer: Self-pay

## 2023-10-26 DIAGNOSIS — L03116 Cellulitis of left lower limb: Secondary | ICD-10-CM | POA: Diagnosis present

## 2023-10-26 DIAGNOSIS — E785 Hyperlipidemia, unspecified: Secondary | ICD-10-CM | POA: Diagnosis not present

## 2023-10-26 DIAGNOSIS — Z789 Other specified health status: Secondary | ICD-10-CM | POA: Diagnosis not present

## 2023-10-26 DIAGNOSIS — I7025 Atherosclerosis of native arteries of other extremities with ulceration: Secondary | ICD-10-CM | POA: Diagnosis present

## 2023-10-26 DIAGNOSIS — Z7401 Bed confinement status: Secondary | ICD-10-CM

## 2023-10-26 DIAGNOSIS — Z7189 Other specified counseling: Secondary | ICD-10-CM | POA: Diagnosis not present

## 2023-10-26 DIAGNOSIS — Z79899 Other long term (current) drug therapy: Secondary | ICD-10-CM

## 2023-10-26 DIAGNOSIS — E66813 Obesity, class 3: Secondary | ICD-10-CM | POA: Diagnosis present

## 2023-10-26 DIAGNOSIS — F4320 Adjustment disorder, unspecified: Secondary | ICD-10-CM | POA: Diagnosis present

## 2023-10-26 DIAGNOSIS — E7800 Pure hypercholesterolemia, unspecified: Secondary | ICD-10-CM | POA: Diagnosis present

## 2023-10-26 DIAGNOSIS — E871 Hypo-osmolality and hyponatremia: Secondary | ICD-10-CM | POA: Diagnosis not present

## 2023-10-26 DIAGNOSIS — E1165 Type 2 diabetes mellitus with hyperglycemia: Secondary | ICD-10-CM | POA: Diagnosis present

## 2023-10-26 DIAGNOSIS — I878 Other specified disorders of veins: Secondary | ICD-10-CM | POA: Diagnosis present

## 2023-10-26 DIAGNOSIS — J9602 Acute respiratory failure with hypercapnia: Secondary | ICD-10-CM | POA: Diagnosis not present

## 2023-10-26 DIAGNOSIS — L03115 Cellulitis of right lower limb: Secondary | ICD-10-CM | POA: Diagnosis present

## 2023-10-26 DIAGNOSIS — R6520 Severe sepsis without septic shock: Secondary | ICD-10-CM | POA: Diagnosis present

## 2023-10-26 DIAGNOSIS — A480 Gas gangrene: Secondary | ICD-10-CM | POA: Diagnosis present

## 2023-10-26 DIAGNOSIS — F410 Panic disorder [episodic paroxysmal anxiety] without agoraphobia: Secondary | ICD-10-CM | POA: Diagnosis present

## 2023-10-26 DIAGNOSIS — F32A Depression, unspecified: Secondary | ICD-10-CM | POA: Diagnosis present

## 2023-10-26 DIAGNOSIS — G9340 Encephalopathy, unspecified: Secondary | ICD-10-CM | POA: Diagnosis not present

## 2023-10-26 DIAGNOSIS — E1152 Type 2 diabetes mellitus with diabetic peripheral angiopathy with gangrene: Secondary | ICD-10-CM | POA: Diagnosis present

## 2023-10-26 DIAGNOSIS — L97929 Non-pressure chronic ulcer of unspecified part of left lower leg with unspecified severity: Secondary | ICD-10-CM | POA: Diagnosis present

## 2023-10-26 DIAGNOSIS — I10 Essential (primary) hypertension: Secondary | ICD-10-CM | POA: Diagnosis present

## 2023-10-26 DIAGNOSIS — L089 Local infection of the skin and subcutaneous tissue, unspecified: Secondary | ICD-10-CM | POA: Diagnosis not present

## 2023-10-26 DIAGNOSIS — G928 Other toxic encephalopathy: Secondary | ICD-10-CM | POA: Diagnosis not present

## 2023-10-26 DIAGNOSIS — Z8249 Family history of ischemic heart disease and other diseases of the circulatory system: Secondary | ICD-10-CM

## 2023-10-26 DIAGNOSIS — N170 Acute kidney failure with tubular necrosis: Secondary | ICD-10-CM | POA: Diagnosis not present

## 2023-10-26 DIAGNOSIS — I70202 Unspecified atherosclerosis of native arteries of extremities, left leg: Secondary | ICD-10-CM | POA: Diagnosis not present

## 2023-10-26 DIAGNOSIS — F17220 Nicotine dependence, chewing tobacco, uncomplicated: Secondary | ICD-10-CM | POA: Diagnosis present

## 2023-10-26 DIAGNOSIS — G546 Phantom limb syndrome with pain: Secondary | ICD-10-CM | POA: Diagnosis present

## 2023-10-26 DIAGNOSIS — R6521 Severe sepsis with septic shock: Secondary | ICD-10-CM | POA: Diagnosis present

## 2023-10-26 DIAGNOSIS — Z6841 Body Mass Index (BMI) 40.0 and over, adult: Secondary | ICD-10-CM | POA: Diagnosis not present

## 2023-10-26 DIAGNOSIS — Z9889 Other specified postprocedural states: Secondary | ICD-10-CM | POA: Diagnosis not present

## 2023-10-26 DIAGNOSIS — E1169 Type 2 diabetes mellitus with other specified complication: Secondary | ICD-10-CM | POA: Diagnosis present

## 2023-10-26 DIAGNOSIS — L97519 Non-pressure chronic ulcer of other part of right foot with unspecified severity: Secondary | ICD-10-CM | POA: Diagnosis present

## 2023-10-26 DIAGNOSIS — F4321 Adjustment disorder with depressed mood: Secondary | ICD-10-CM | POA: Diagnosis not present

## 2023-10-26 DIAGNOSIS — T148XXA Other injury of unspecified body region, initial encounter: Secondary | ICD-10-CM | POA: Diagnosis not present

## 2023-10-26 DIAGNOSIS — E11628 Type 2 diabetes mellitus with other skin complications: Secondary | ICD-10-CM | POA: Diagnosis present

## 2023-10-26 DIAGNOSIS — E162 Hypoglycemia, unspecified: Secondary | ICD-10-CM | POA: Diagnosis present

## 2023-10-26 DIAGNOSIS — Z794 Long term (current) use of insulin: Secondary | ICD-10-CM | POA: Diagnosis not present

## 2023-10-26 DIAGNOSIS — Z515 Encounter for palliative care: Secondary | ICD-10-CM

## 2023-10-26 DIAGNOSIS — J9601 Acute respiratory failure with hypoxia: Secondary | ICD-10-CM | POA: Diagnosis not present

## 2023-10-26 DIAGNOSIS — E1142 Type 2 diabetes mellitus with diabetic polyneuropathy: Secondary | ICD-10-CM | POA: Diagnosis present

## 2023-10-26 DIAGNOSIS — A419 Sepsis, unspecified organism: Principal | ICD-10-CM | POA: Diagnosis present

## 2023-10-26 DIAGNOSIS — I70235 Atherosclerosis of native arteries of right leg with ulceration of other part of foot: Secondary | ICD-10-CM | POA: Diagnosis present

## 2023-10-26 DIAGNOSIS — Z7982 Long term (current) use of aspirin: Secondary | ICD-10-CM

## 2023-10-26 DIAGNOSIS — Z7902 Long term (current) use of antithrombotics/antiplatelets: Secondary | ICD-10-CM

## 2023-10-26 DIAGNOSIS — M869 Osteomyelitis, unspecified: Secondary | ICD-10-CM | POA: Diagnosis present

## 2023-10-26 DIAGNOSIS — D509 Iron deficiency anemia, unspecified: Secondary | ICD-10-CM | POA: Diagnosis present

## 2023-10-26 DIAGNOSIS — E875 Hyperkalemia: Secondary | ICD-10-CM | POA: Diagnosis present

## 2023-10-26 DIAGNOSIS — Z833 Family history of diabetes mellitus: Secondary | ICD-10-CM

## 2023-10-26 DIAGNOSIS — I96 Gangrene, not elsewhere classified: Secondary | ICD-10-CM | POA: Diagnosis not present

## 2023-10-26 DIAGNOSIS — E11621 Type 2 diabetes mellitus with foot ulcer: Secondary | ICD-10-CM | POA: Diagnosis present

## 2023-10-26 DIAGNOSIS — I70249 Atherosclerosis of native arteries of left leg with ulceration of unspecified site: Secondary | ICD-10-CM | POA: Diagnosis not present

## 2023-10-26 LAB — CBG MONITORING, ED
Glucose-Capillary: 22 mg/dL — CL (ref 70–99)
Glucose-Capillary: 25 mg/dL — CL (ref 70–99)
Glucose-Capillary: 43 mg/dL — CL (ref 70–99)

## 2023-10-26 LAB — CBC WITH DIFFERENTIAL/PLATELET
Abs Immature Granulocytes: 0.15 10*3/uL — ABNORMAL HIGH (ref 0.00–0.07)
Basophils Absolute: 0 10*3/uL (ref 0.0–0.1)
Basophils Relative: 0 %
Eosinophils Absolute: 0.1 10*3/uL (ref 0.0–0.5)
Eosinophils Relative: 1 %
HCT: 36.2 % — ABNORMAL LOW (ref 39.0–52.0)
Hemoglobin: 10.9 g/dL — ABNORMAL LOW (ref 13.0–17.0)
Immature Granulocytes: 1 %
Lymphocytes Relative: 12 %
Lymphs Abs: 1.4 10*3/uL (ref 0.7–4.0)
MCH: 29.1 pg (ref 26.0–34.0)
MCHC: 30.1 g/dL (ref 30.0–36.0)
MCV: 96.5 fL (ref 80.0–100.0)
Monocytes Absolute: 0.9 10*3/uL (ref 0.1–1.0)
Monocytes Relative: 8 %
Neutro Abs: 8.7 10*3/uL — ABNORMAL HIGH (ref 1.7–7.7)
Neutrophils Relative %: 78 %
Platelets: 238 10*3/uL (ref 150–400)
RBC: 3.75 MIL/uL — ABNORMAL LOW (ref 4.22–5.81)
RDW: 15.5 % (ref 11.5–15.5)
WBC: 11.3 10*3/uL — ABNORMAL HIGH (ref 4.0–10.5)
nRBC: 0 % (ref 0.0–0.2)

## 2023-10-26 LAB — COMPREHENSIVE METABOLIC PANEL WITH GFR
ALT: 18 U/L (ref 0–44)
AST: 21 U/L (ref 15–41)
Albumin: 3 g/dL — ABNORMAL LOW (ref 3.5–5.0)
Alkaline Phosphatase: 63 U/L (ref 38–126)
Anion gap: 13 (ref 5–15)
BUN: 32 mg/dL — ABNORMAL HIGH (ref 6–20)
CO2: 24 mmol/L (ref 22–32)
Calcium: 8.3 mg/dL — ABNORMAL LOW (ref 8.9–10.3)
Chloride: 94 mmol/L — ABNORMAL LOW (ref 98–111)
Creatinine, Ser: 1.2 mg/dL (ref 0.61–1.24)
GFR, Estimated: >60 mL/min (ref >60)
Glucose, Bld: 159 mg/dL — ABNORMAL HIGH (ref 70–99)
Potassium: 4 mmol/L (ref 3.5–5.1)
Sodium: 131 mmol/L — ABNORMAL LOW (ref 135–145)
Total Bilirubin: 0.7 mg/dL (ref 0.0–1.2)
Total Protein: 7.7 g/dL (ref 6.5–8.1)

## 2023-10-26 LAB — APTT: aPTT: 36 s (ref 24–36)

## 2023-10-26 LAB — LACTIC ACID, PLASMA
Lactic Acid, Venous: 2.3 mmol/L (ref 0.5–1.9)
Lactic Acid, Venous: 1.6 mmol/L (ref 0.5–1.9)

## 2023-10-26 LAB — SEDIMENTATION RATE: Sed Rate: 95 mm/h — ABNORMAL HIGH (ref 0–20)

## 2023-10-26 LAB — PROTIME-INR
INR: 1.2 (ref 0.8–1.2)
Prothrombin Time: 15.1 s (ref 11.4–15.2)

## 2023-10-26 MED ORDER — PIPERACILLIN-TAZOBACTAM 3.375 G IVPB
3.3750 g | Freq: Three times a day (TID) | INTRAVENOUS | Status: DC
  Administered 2023-10-27 – 2023-10-29 (×7): 3.375 g via INTRAVENOUS
  Filled 2023-10-26 (×8): qty 50

## 2023-10-26 MED ORDER — INSULIN ASPART 100 UNIT/ML IJ SOLN: 0.0000 [IU] | Freq: Three times a day (TID) | INTRAMUSCULAR | Status: DC

## 2023-10-26 MED ORDER — INSULIN ASPART 100 UNIT/ML IJ SOLN: 0.0000 [IU] | Freq: Every day | INTRAMUSCULAR | Status: DC

## 2023-10-26 MED ORDER — ONDANSETRON HCL 4 MG/2ML IJ SOLN
4.0000 mg | Freq: Three times a day (TID) | INTRAMUSCULAR | Status: DC | PRN
  Administered 2023-10-26: 4 mg via INTRAVENOUS
  Filled 2023-10-26: qty 2

## 2023-10-26 MED ORDER — LACTATED RINGERS IV SOLN: INTRAVENOUS | Status: DC

## 2023-10-26 MED ORDER — HYDROMORPHONE HCL 1 MG/ML IJ SOLN: 0.5000 mg | Freq: Once | INTRAMUSCULAR | Status: DC

## 2023-10-26 MED ORDER — VANCOMYCIN HCL 1500 MG/300ML IV SOLN
1500.0000 mg | INTRAVENOUS | Status: DC
  Administered 2023-10-26: 1500 mg via INTRAVENOUS
  Filled 2023-10-26 (×2): qty 300

## 2023-10-26 MED ORDER — ASPIRIN 81 MG PO TBEC
81.0000 mg | DELAYED_RELEASE_TABLET | Freq: Every day | ORAL | Status: DC
  Filled 2023-10-26: qty 1

## 2023-10-26 MED ORDER — OXYCODONE-ACETAMINOPHEN 5-325 MG PO TABS
1.0000 | ORAL_TABLET | ORAL | Status: DC | PRN
  Administered 2023-10-26 – 2023-10-27 (×3): 1 via ORAL
  Filled 2023-10-26 (×3): qty 1

## 2023-10-26 MED ORDER — HYDROMORPHONE HCL 1 MG/ML IJ SOLN
1.0000 mg | INTRAMUSCULAR | Status: DC | PRN
  Administered 2023-10-26 – 2023-10-27 (×5): 1 mg via INTRAVENOUS
  Filled 2023-10-26 (×5): qty 1

## 2023-10-26 MED ORDER — ACETAMINOPHEN 325 MG PO TABS
650.0000 mg | ORAL_TABLET | Freq: Four times a day (QID) | ORAL | Status: DC | PRN
  Administered 2023-10-26: 650 mg via ORAL
  Filled 2023-10-26: qty 2

## 2023-10-26 MED ORDER — DEXTROSE 50 % IV SOLN
50.0000 mL | INTRAVENOUS | Status: DC | PRN
  Administered 2023-10-26 – 2023-10-28 (×5): 50 mL via INTRAVENOUS
  Administered 2023-10-28: 25 mL via INTRAVENOUS
  Administered 2023-10-28: 50 mL via INTRAVENOUS
  Filled 2023-10-26 (×5): qty 50

## 2023-10-26 MED ORDER — LACTATED RINGERS IV BOLUS
2000.0000 mL | Freq: Once | INTRAVENOUS | Status: AC
  Administered 2023-10-26: 2000 mL via INTRAVENOUS

## 2023-10-26 MED ORDER — HYDRALAZINE HCL 20 MG/ML IJ SOLN: 5.0000 mg | INTRAMUSCULAR | Status: DC | PRN

## 2023-10-26 MED ORDER — PIPERACILLIN-TAZOBACTAM 3.375 G IVPB 30 MIN
3.3750 g | Freq: Once | INTRAVENOUS | Status: AC
  Administered 2023-10-26: 3.375 g via INTRAVENOUS
  Filled 2023-10-26 (×2): qty 50

## 2023-10-26 MED ORDER — HEPARIN SODIUM (PORCINE) 5000 UNIT/ML IJ SOLN
5000.0000 [IU] | Freq: Three times a day (TID) | INTRAMUSCULAR | Status: DC
  Administered 2023-10-26 – 2023-11-03 (×20): 5000 [IU] via SUBCUTANEOUS
  Filled 2023-10-26 (×21): qty 1

## 2023-10-26 MED ORDER — VANCOMYCIN HCL IN DEXTROSE 1-5 GM/200ML-% IV SOLN
1000.0000 mg | INTRAVENOUS | Status: DC
  Administered 2023-10-26: 1000 mg via INTRAVENOUS
  Filled 2023-10-26: qty 200

## 2023-10-26 MED ORDER — VANCOMYCIN HCL 2000 MG/400ML IV SOLN: 2000.0000 mg | Freq: Once | INTRAVENOUS | Status: DC

## 2023-10-26 MED ORDER — VANCOMYCIN HCL 1750 MG/350ML IV SOLN
1750.0000 mg | Freq: Two times a day (BID) | INTRAVENOUS | Status: DC
  Administered 2023-10-28 – 2023-10-29 (×3): 1750 mg via INTRAVENOUS
  Filled 2023-10-26 (×6): qty 350

## 2023-10-26 NOTE — ED Triage Notes (Signed)
 Patient presents with wound on right foot. Wife reports it started as blister and then popped and has gradually gotten worse.  Patient reports extreme pain   When attempting to unwrap wound, a maggot fell out of wrapping. Wound is extremely foul smelling

## 2023-10-26 NOTE — H&P (Addendum)
 History and Physical    [name] MVH:[id number] DOB: January 13, 1971 DOA: 10/26/2023  Referring MD/NP/PA:   PCP: Thomasene Flemings, NP   Patient coming from:  The patient is coming from home.     Chief Complaint: right foot wound infection  HPI: Martin Riddle is a 53 y.o. male with medical history significant of DM, atherosclerosis of native arteries of the extremities with ulceration, diabetic foot ulcer, HTN, HLD, depression, chronic venous stasis in both legs, pancreatitis, peripheral neuropathy, morbid obesity with BMI of 47, who presents with right foot wound infection.  Pt state that he has right foot wound for more than 3 weeks, which has been progressively worsening, with foul smell. His right foot is painful, which is constant, sharp, moderate to severe, nonradiating.  Denies fever or chills.  Patient had chest discomfort earlier, which has resolved.  Currently no leg chest pain, cough, SOB.  No nausea, vomiting, diarrhea or abdominal pain.  No symptoms of UTI. Of note, patient was seen by vascular surgeon, Dr. Vonna Guardian on 5/13 and had a negative DVT study.  Dr. Vonna Guardian recommended an angiography with possible revascularization.    Data reviewed independently and ED Course: pt was found to have WBC 11.3, lactic acid 2.3, GFR> 60, temperature normal, blood pressure 139/69, heart rate 99 --> 103, RR 25, oxygen saturation 96% on room air.  Patient is admitted to MedSurg bed as inpatient.  Dr. Prescilla Brod for VVS and Dr. Rosemarie Conquest of podiatry are consulted.   X-ray right foot: 1. Large soft tissue ulceration of the distal first toe with marked erosive changes of the first distal phalanx extending to the interphalangeal joint space. 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 2. Soft tissue gas surrounding the first proximal phalanx, concerning for infection with gas-forming organism.     EKG: Not done in ED, will get one.    Review of Systems:   General: no fevers,  chills, no body weight gain, has fatigue HEENT: no blurry vision, hearing changes or sore throat Respiratory: no dyspnea, coughing, wheezing CV: no chest pain, no palpitations GI: no nausea, vomiting, abdominal pain, diarrhea, constipation GU: no dysuria, burning on urination, increased urinary frequency, hematuria  Ext: has leg edema Neuro: no unilateral weakness, numbness, or tingling, no vision change or hearing loss Skin: has foot ulcers MSK: No muscle spasm, no deformity, no limitation of range of movement in spin Heme: No easy bruising.  Travel history: No recent long distant travel.   Allergy:  Allergies  Allergen Reactions   Morphine Other (See Comments)    headache     A HEADACHE AND SWEATING AND LETHARGIC headache  headache, ,  A HEADACHE AND SWEATING AND LETHARGIC headache   Morphine And Codeine Other (See Comments)    headache   Other Other (See Comments)    A HEADACHE AND SWEATING AND LETHARGIC    headache  A HEADACHE AND SWEATING AND LETHARGIC, , headache    Past Medical History:  Diagnosis Date   Diabetes mellitus without complication (HCC)    Hypercholesteremia    Hypertension    Pancreatitis     Past Surgical History:  Procedure Laterality Date   ANKLE SURGERY Left 2001,2003   BACK SURGERY  [phone]    Social History:  reports that he has quit smoking. His smoking use included cigarettes. His smokeless tobacco use includes chew. He reports that he does not drink alcohol and does not use drugs.  Family History:  Family History  Problem Relation Age  of Onset   Heart disease Mother    Diabetes Mother    Diabetes Sister      Prior to Admission medications   Medication Sig Start Date End Date Taking? Authorizing Provider  acetaminophen  (TYLENOL ) 500 MG tablet Take 1,000 mg by mouth every 6 (six) hours as needed for mild pain or moderate pain.    [provider]  atorvastatin  (LIPITOR) 20 MG tablet Take 1 tablet (20 mg total) by mouth  daily. 06/02/21   Amin, Sumayya, MD  busPIRone  (BUSPAR ) 15 MG tablet Take 15 mg by mouth 3 (three) times daily.    [provider]  cetirizine (ZYRTEC) 10 MG tablet Take 10 mg by mouth daily.    [provider]  cloNIDine  (CATAPRES ) 0.2 MG tablet Take 0.2 mg by mouth 3 (three) times daily.    [provider]  DULoxetine  (CYMBALTA ) 60 MG capsule Take 60 mg by mouth daily.    [provider]  gemfibrozil  (LOPID ) 600 MG tablet Take 1 tablet (600 mg total) by mouth 2 (two) times daily before a meal. 01/14/17   Marquita Situ, MD  HUMALOG  100 UNIT/ML injection SMARTSIG:25 Unit(s) SUB-Q    [provider]  insulin  NPH Human (NOVOLIN N) 100 UNIT/ML injection Inject 0-50 Units into the skin 2 (two) times daily.    [provider]  insulin  regular (NOVOLIN R) 100 units/mL injection Inject into the skin See admin instructions. Inject twice daily according to blood glucose reading    [provider]  LANTUS  100 UNIT/ML injection SMARTSIG:100 Unit(s) SUB-Q Twice Daily    [provider]  losartan  (COZAAR ) 100 MG tablet Take 100 mg by mouth. 04/23/22   [provider]  losartan -hydrochlorothiazide  (HYZAAR) 100-25 MG tablet Take 1 tablet by mouth daily.    [provider]  Multiple Vitamin (MULTIVITAMIN WITH MINERALS) TABS tablet Take 1 tablet by mouth daily. Patient not taking: Reported on 10/20/2023 06/02/21   Amin, Sumayya, MD  naproxen sodium (ALEVE) 220 MG tablet Take 440 mg by mouth 2 (two) times daily as needed (pain). Patient not taking: Reported on 10/20/2023    [provider]  nitroGLYCERIN (NITROSTAT) 0.4 MG SL tablet Place under the tongue. 10/02/23   [provider]  polyethylene glycol (MIRALAX  / GLYCOLAX ) packet Take 17 g by mouth daily. Patient not taking: Reported on 10/20/2023 01/15/17   Marquita Situ, MD  pregabalin  (LYRICA ) 25 MG capsule Take 1 capsule (25 mg total)  by mouth 3 (three) times daily. 07/05/21 10/19/24  Heather Litter, MD  rOPINIRole (REQUIP) 1 MG tablet Take 1 mg by mouth. 04/11/22   [provider]  torsemide  (DEMADEX ) 20 MG tablet Take 1 tablet (20 mg total) by mouth daily. Patient not taking: Reported on 10/20/2023 06/01/21   Luna Salinas, MD    Physical Exam: Vitals:   10/26/23 1803 10/26/23 1804 10/26/23 2003 10/26/23 2200  BP: 116/63  139/69   Pulse: 96  99   Resp: (!) 22  (!) 25 (!) 27  Temp: 98.2 F (36.8 C)  98.3 F (36.8 C)   TempSrc: Oral  Oral   SpO2: 91%  96% 93%  Weight:  (!) 181.4 kg    Height:  6\' 5"  (1.956 m)     General: Not in acute distress HEENT:       Eyes: PERRL, EOMI, no jaundice       ENT: No discharge from the ears and nose, no pharynx injection, no tonsillar enlargement.  Neck: No JVD, no bruit, no mass felt. Heme: No neck lymph node enlargement. Cardiac: S1/S2, RRR, No murmurs, No gallops or rubs. Respiratory: No rales, wheezing, rhonchi or rubs. GI: Soft, nondistended, nontender, no rebound pain, no organomegaly, BS present. GU: No hematuria Ext: has wound, erythema, tenderness, discoloration and pus drainage in right foot, with foul-smelling.         Musculoskeletal: No joint deformities, No joint redness or warmth, no limitation of ROM in spin. Skin: No rashes.  Neuro: Alert, oriented X3, cranial nerves II-XII grossly intact, moves all extremities normally. Psych: Patient is not psychotic, no suicidal or hemocidal ideation.  Labs on Admission: I have personally reviewed following labs and imaging studies  CBC: Recent Labs  Lab 10/26/23 1817  WBC 11.3*  NEUTROABS 8.7*  HGB 10.9*  HCT 36.2*  MCV 96.5  PLT 238   Basic Metabolic Panel: Recent Labs  Lab 10/26/23 1817  NA 131*  K 4.0  CL 94*  CO2 24  GLUCOSE 159*  BUN 32*  CREATININE 1.20  CALCIUM  8.3*   GFR: Estimated Creatinine Clearance: 126.9 mL/min (by C-G formula based on SCr of 1.2 mg/dL). Liver  Function Tests: Recent Labs  Lab 10/26/23 1817  AST 21  ALT 18  ALKPHOS 63  BILITOT 0.7  PROT 7.7  ALBUMIN 3.0*   No results for input(s): "LIPASE", "AMYLASE" in the last 168 hours. No results for input(s): "AMMONIA" in the last 168 hours. Coagulation Profile: Recent Labs  Lab 10/26/23 2136  INR 1.2   Cardiac Enzymes: No results for input(s): "CKTOTAL", "CKMB", "CKMBINDEX", "TROPONINI" in the last 168 hours. BNP (last 3 results) No results for input(s): "PROBNP" in the last 8760 hours. HbA1C: No results for input(s): "HGBA1C" in the last 72 hours. CBG: No results for input(s): "GLUCAP" in the last 168 hours. Lipid Profile: No results for input(s): "CHOL", "HDL", "LDLCALC", "TRIG", "CHOLHDL", "LDLDIRECT" in the last 72 hours. Thyroid Function Tests: No results for input(s): "TSH", "T4TOTAL", "FREET4", "T3FREE", "THYROIDAB" in the last 72 hours. Anemia Panel: No results for input(s): "VITAMINB12", "FOLATE", "FERRITIN", "TIBC", "IRON", "RETICCTPCT" in the last 72 hours. Urine analysis:    Component Value Date/Time   COLORURINE YELLOW 05/30/2021 1243   APPEARANCEUR CLEAR 05/30/2021 1243   APPEARANCEUR Clear 03/10/2014 1305   LABSPEC 1.025 05/30/2021 1243   LABSPEC 1.028 03/10/2014 1305   PHURINE 5.5 05/30/2021 1243   GLUCOSEU 500 (A) 05/30/2021 1243   GLUCOSEU >=500 03/10/2014 1305   HGBUR NEGATIVE 05/30/2021 1243   BILIRUBINUR NEGATIVE 05/30/2021 1243   BILIRUBINUR Negative 03/10/2014 1305   KETONESUR NEGATIVE 05/30/2021 1243   PROTEINUR NEGATIVE 05/30/2021 1243   UROBILINOGEN 1.0 10/12/2009 1213   NITRITE NEGATIVE 05/30/2021 1243   LEUKOCYTESUR NEGATIVE 05/30/2021 1243   LEUKOCYTESUR Negative 03/10/2014 1305   Sepsis Labs: @LABRCNTIP (procalcitonin:4,lacticidven:4) )No results found for this or any previous visit (from the past 240 hours).   Radiological Exams on Admission:   Assessment/Plan Principal Problem:   Diabetic foot infection (HCC) Active  Problems:   Osteomyelitis of right foot (HCC)   Atherosclerosis of native arteries of the extremities with ulceration (HCC)   Severe sepsis (HCC)   Type 2 diabetes mellitus with peripheral neuropathy (HCC)   HTN (hypertension)   HLD (hyperlipidemia)   Depression   Obesity, Class III, BMI 40-49.9 (morbid obesity)   Assessment and Plan:   Severe sepsis due to diabetic foot infection with wound in right foot and right foot osteomyelitis: Patient meets criteria for severe sepsis with heart  rate upto 103 and RR 25. Lactic acid 2.3.  Also has leukocytosis with WBC 11.3.  Dr. Prescilla Brod for [facility] and Dr. Rosemarie Conquest of podiatry are consulted.  - will admit to med-bed as inpatient - Empiric antimicrobial treatment with vancomycin  and zosyn  - PRN Zofran  for nausea, and Tylenol , dilaudid  and Percocet for pain - Blood cultures x 2  - ESR and CRP - wound care consult - Trend lactic acid levels per sepsis protocol. - IVF:  2L of NS bolus in ED, followed by 75 cc/h  - NPO after MN  Atherosclerosis of native arteries of the extremities with ulceration (HCC) - Dr. Prescilla Brod for [facility] is consulted - continue lipitor - add ASA 81 mg daily  Type 2 diabetes mellitus with peripheral neuropathy (HCC): Recent A1c 6.3, controlled.  Patient is taking NovoLog  and Lantus  - SSI - -hold long-lasting insulin   HTN (hypertension): -IV hydralazine  as needed - will hold Losartan  due to soft  Bp now  HLD (hyperlipidemia) -Lipitor  Depression -Continue home medications  Obesity, Class III, BMI 40-49.9 (morbid obesity): Body weight 181.4 kg, BMI 47.43 - Encourage to lose weight - Exercise and healthy diet  Addendum: Patient had a blood sugar 159 initially met BMI up, mother later found to have hypoglycemia with CBG 25 --> 22. -will give D50 prn -check CBG q1h for 3 hours - Hold long-lasting insulin    DVT ppx: SQ Heparin    Code Status: Full code  Family Communication:    Yes, patient's wife  at bed side.      Disposition Plan:  Anticipate discharge back to previous environment  Consults called:   Dr. Prescilla Brod for [facility] and Dr. Rosemarie Conquest of podiatry are consulted.  Admission status and Level of care: Med-Surg:  as inpt        Dispo: The patient is from: Home              Anticipated d/c is to: Home              Anticipated d/c date is: 2 days              Patient currently is not medically stable to d/c.    Severity of Illness:  The appropriate patient status for this patient is INPATIENT. Inpatient status is judged to be reasonable and necessary in order to provide the required intensity of service to ensure the patient's safety. The patient's presenting symptoms, physical exam findings, and initial radiographic and laboratory data in the context of their chronic comorbidities is felt to place them at high risk for further clinical deterioration. Furthermore, it is not anticipated that the patient will be medically stable for discharge from the hospital within 2 midnights of admission.   * I certify that at the point of admission it is my clinical judgment that the patient will require inpatient hospital care spanning beyond 2 midnights from the point of admission due to high intensity of service, high risk for further deterioration and high frequency of surveillance required.*       Date of Service 10/26/2023    Fidencio Hue [facility]   If 7PM-7AM, please contact night-coverage [email or web address] 10/26/2023, 10:45 PM]

## 2023-10-26 NOTE — ED Notes (Signed)
 2 unsuccessful attempts for IV placement to RAC.

## 2023-10-26 NOTE — ED Notes (Signed)
 Faith, EDT informed pt and family again that they are only allow 1 visitor in the lobby. Pt stated to Faith, EDT that she was a "hateful bitch"

## 2023-10-26 NOTE — ED Notes (Addendum)
 Pt blood sugar checked and reading is low. Mansy, MD paged. Awaiting callback. Pt provided w/ OJ and wife provided pt w/ sweet tea and meal form [employer]. Pt currently eating.   Mansy, MD called back; protocol followed. No new orders at this time.

## 2023-10-26 NOTE — ED Notes (Signed)
 This RN informed pt and family of visitation policy, pt stated that we were "assholes"

## 2023-10-26 NOTE — ED Provider Triage Note (Signed)
 Emergency Medicine Provider Triage Evaluation Note  Baldwin Racicot , a 53 y.o. male  was evaluated in triage.  Pt complains of wound on right foot x 3-4 months. Was evaluated by wound care 3 months ago but has not been back. I attempted to visualize the wound in triage, while doing so a maggot fell from wound while taking off compression sock. Unable to assess full severity in triage. Hx of diabetes.   Review of Systems  Positive:  Negative:   Physical Exam  BP 116/63 (BP Location: Left Arm)   Pulse 96   Temp 98.2 F (36.8 C) (Oral)   Resp (!) 22   Ht 6\' 5"  (1.956 m)   Wt (!) 181.4 kg   SpO2 91%   BMI 47.43 kg/m  Gen:   Awake, no distress   Resp:  Normal effort  MSK:   Moves extremities without difficulty    Medical Decision Making  Medically screening exam initiated at 6:10 PM.  Appropriate orders placed.  [name] was informed that the remainder of the evaluation will be completed by another provider, this initial triage assessment does not replace that evaluation, and the importance of remaining in the ED until their evaluation is complete.    Phyllis Breeze, Shaquan Puerta A, PA-C 10/26/23 1827

## 2023-10-26 NOTE — Sepsis Progress Note (Signed)
 Elink monitoring for the code sepsis protocol.

## 2023-10-26 NOTE — ED Provider Notes (Signed)
 [facility] Provider Note    Event Date/Time   First MD Initiated Contact with Patient 10/26/23 1930     (approximate)   History   Wound Infection   HPI  Martin Riddle is a 53 y.o. male past medical history significant for diabetes, hypertension, hyperlipidemia presents to the emergency department with wound infection.  States that he has been having an ongoing wound to his right foot.  Recently evaluated by vascular surgery.  Came in today because he had significantly worsening pain that started yesterday.  Subjective chills.  Denies fever.  Denies nausea or vomiting.  On chart review patient had a recent evaluation by Dr. Vonna Guardian with vascular surgery for nonhealing ulcers.  Has a negative DVT study.  Vascular surgery had recently recommended an angiography with possible revascularization.     Physical Exam   Triage Vital Signs: ED Triage Vitals  Encounter Vitals Group     BP 10/26/23 1803 116/63     Systolic BP Percentile --      Diastolic BP Percentile --      Pulse Rate 10/26/23 1803 96     Resp 10/26/23 1803 (!) 22     Temp 10/26/23 1803 98.2 F (36.8 C)     Temp Source 10/26/23 1803 Oral     SpO2 10/26/23 1803 91 %     Weight 10/26/23 1804 (!) 400 lb (181.4 kg)     Height 10/26/23 1804 6\' 5"  (1.956 m)     Head Circumference --      Peak Flow --      Pain Score 10/26/23 1804 8     Pain Loc --      Pain Education --      Exclude from Growth Chart --     Most recent vital signs: Vitals:   10/26/23 1803 10/26/23 2003  BP: 116/63 139/69  Pulse: 96 99  Resp: (!) 22 (!) 25  Temp: 98.2 F (36.8 C) 98.3 F (36.8 C)  SpO2: 91% 96%    Physical Exam Constitutional:      Appearance: He is well-developed.  HENT:     Head: Atraumatic.  Eyes:     Conjunctiva/sclera: Conjunctivae normal.  Cardiovascular:     Rate and Rhythm: Regular rhythm.  Pulmonary:     Effort: No respiratory distress.  Musculoskeletal:     Cervical back: Normal  range of motion.     Comments: Maggots out of the right great toe.  Erythema and warmth to the right lower extremity.  Foul-smelling.  Skin:    General: Skin is warm.  Neurological:     Mental Status: He is alert. Mental status is at baseline.          IMPRESSION / MDM / ASSESSMENT AND PLAN / ED COURSE  I reviewed the triage vital signs and the nursing notes.  Differential diagnosis including osteomyelitis, diabetic foot wound/peripheral arterial disease causing foot wound   No tachycardic or bradycardic dysrhythmias while on cardiac telemetry.  RADIOLOGY X-ray of the right foot ordered  LABS (all labs ordered are listed, but only abnormal results are displayed) Labs interpreted as -    Labs Reviewed  CBC WITH DIFFERENTIAL/PLATELET - Abnormal; Notable for the following components:      Result Value   WBC 11.3 (*)    RBC 3.75 (*)    Hemoglobin 10.9 (*)    HCT 36.2 (*)    Neutro Abs 8.7 (*)    Abs Immature Granulocytes  0.15 (*)    All other components within normal limits  COMPREHENSIVE METABOLIC PANEL WITH GFR - Abnormal; Notable for the following components:   Sodium 131 (*)    Chloride 94 (*)    Glucose, Bld 159 (*)    BUN 32 (*)    Calcium  8.3 (*)    Albumin 3.0 (*)    All other components within normal limits  LACTIC ACID, PLASMA - Abnormal; Notable for the following components:   Lactic Acid, Venous 2.3 (*)    All other components within normal limits  CULTURE, BLOOD (SINGLE)  CULTURE, BLOOD (SINGLE)  LACTIC ACID, PLASMA     MDM  Leukocytosis of 11.  Anemia but hemoglobin has not been checked recently but is stable at 10.9.  Sodium 131.  Creatinine appears to be at baseline.  Initial lactic acid 2.3.  No crepitus have a low suspicion for necrotizing soft tissue infection.  Blood cultures obtained.  Started on antibiotics with vancomycin  and Zosyn .  Consulted hospitalist for admission for foot wound.     PROCEDURES:  Critical Care performed:  yes  .Critical Care  Performed by: Viviano Ground, MD Authorized by: Viviano Ground, MD   Critical care provider statement:    Critical care time (minutes):  30   Critical care time was exclusive of:  Separately billable procedures and treating other patients   Critical care was necessary to treat or prevent imminent or life-threatening deterioration of the following conditions:  Sepsis   Critical care was time spent personally by me on the following activities:  Development of treatment plan with patient or surrogate, discussions with consultants, evaluation of patient's response to treatment, examination of patient, ordering and review of laboratory studies, ordering and review of radiographic studies, ordering and performing treatments and interventions, pulse oximetry, re-evaluation of patient's condition and review of old charts   Care discussed with: admitting provider     Patient's presentation is most consistent with acute presentation with potential threat to life or bodily function.   MEDICATIONS ORDERED IN ED: Medications  lactated ringers  infusion (has no administration in time range)  vancomycin  (VANCOREADY) IVPB 2000 mg/400 mL (has no administration in time range)  piperacillin -tazobactam (ZOSYN ) IVPB 3.375 g (has no administration in time range)  HYDROmorphone  (DILAUDID ) injection 0.5 mg (has no administration in time range)    FINAL CLINICAL IMPRESSION(S) / ED DIAGNOSES   Final diagnoses:  Wound infection     Rx / DC Orders   ED Discharge Orders     None        Note:  This document was prepared using Dragon voice recognition software and may include unintentional dictation errors.   Viviano Ground, MD 10/26/23 2045

## 2023-10-26 NOTE — ED Notes (Addendum)
 Oxygen titrated to 4L at this time. Pt was 88% on 2L [place]  [place], Md at bedside

## 2023-10-26 NOTE — Code Documentation (Signed)
 CODE SEPSIS - PHARMACY COMMUNICATION  **Broad Spectrum Antibiotics should be administered within 1 hour of Sepsis diagnosis**  Time Code Sepsis Called/Page Received: 2041  Antibiotics Ordered: vancomycin  and Zosyn   Time of 1st antibiotic administration: 2124  Additional action taken by pharmacy: none required  If necessary, Name of Provider/Nurse Contacted: N/A    Adalberto Acton ,PharmD Clinical Pharmacist  10/26/2023  8:44 PM

## 2023-10-26 NOTE — ED Notes (Signed)
 Float RN at bedside attempting IV start

## 2023-10-27 ENCOUNTER — Inpatient Hospital Stay

## 2023-10-27 DIAGNOSIS — R6520 Severe sepsis without septic shock: Secondary | ICD-10-CM | POA: Diagnosis not present

## 2023-10-27 DIAGNOSIS — F4321 Adjustment disorder with depressed mood: Secondary | ICD-10-CM | POA: Diagnosis not present

## 2023-10-27 DIAGNOSIS — Z515 Encounter for palliative care: Secondary | ICD-10-CM

## 2023-10-27 DIAGNOSIS — A419 Sepsis, unspecified organism: Secondary | ICD-10-CM | POA: Diagnosis not present

## 2023-10-27 DIAGNOSIS — E11628 Type 2 diabetes mellitus with other skin complications: Secondary | ICD-10-CM | POA: Diagnosis not present

## 2023-10-27 DIAGNOSIS — M869 Osteomyelitis, unspecified: Secondary | ICD-10-CM

## 2023-10-27 DIAGNOSIS — A480 Gas gangrene: Secondary | ICD-10-CM

## 2023-10-27 DIAGNOSIS — Z7189 Other specified counseling: Secondary | ICD-10-CM | POA: Diagnosis not present

## 2023-10-27 DIAGNOSIS — L089 Local infection of the skin and subcutaneous tissue, unspecified: Secondary | ICD-10-CM | POA: Diagnosis not present

## 2023-10-27 DIAGNOSIS — G9340 Encephalopathy, unspecified: Secondary | ICD-10-CM

## 2023-10-27 LAB — BASIC METABOLIC PANEL WITH GFR
Anion gap: 7 (ref 5–15)
BUN: 34 mg/dL — ABNORMAL HIGH (ref 6–20)
CO2: 28 mmol/L (ref 22–32)
Calcium: 8 mg/dL — ABNORMAL LOW (ref 8.9–10.3)
Chloride: 97 mmol/L — ABNORMAL LOW (ref 98–111)
Creatinine, Ser: 1.3 mg/dL — ABNORMAL HIGH (ref 0.61–1.24)
GFR, Estimated: >60 mL/min (ref >60)
Glucose, Bld: 134 mg/dL — ABNORMAL HIGH (ref 70–99)
Potassium: 4.9 mmol/L (ref 3.5–5.1)
Sodium: 132 mmol/L — ABNORMAL LOW (ref 135–145)

## 2023-10-27 LAB — GLUCOSE, CAPILLARY
Glucose-Capillary: 128 mg/dL — ABNORMAL HIGH (ref 70–99)
Glucose-Capillary: 140 mg/dL — ABNORMAL HIGH (ref 70–99)
Glucose-Capillary: 143 mg/dL — ABNORMAL HIGH (ref 70–99)
Glucose-Capillary: 155 mg/dL — ABNORMAL HIGH (ref 70–99)
Glucose-Capillary: 171 mg/dL — ABNORMAL HIGH (ref 70–99)
Glucose-Capillary: 184 mg/dL — ABNORMAL HIGH (ref 70–99)
Glucose-Capillary: 193 mg/dL — ABNORMAL HIGH (ref 70–99)
Glucose-Capillary: 208 mg/dL — ABNORMAL HIGH (ref 70–99)
Glucose-Capillary: 259 mg/dL — ABNORMAL HIGH (ref 70–99)
Glucose-Capillary: 26 mg/dL — CL (ref 70–99)
Glucose-Capillary: 31 mg/dL — CL (ref 70–99)
Glucose-Capillary: 76 mg/dL (ref 70–99)

## 2023-10-27 LAB — CBG MONITORING, ED
Glucose-Capillary: 168 mg/dL — ABNORMAL HIGH (ref 70–99)
Glucose-Capillary: 17 mg/dL — CL (ref 70–99)
Glucose-Capillary: 21 mg/dL — CL (ref 70–99)
Glucose-Capillary: 214 mg/dL — ABNORMAL HIGH (ref 70–99)
Glucose-Capillary: 60 mg/dL — ABNORMAL LOW (ref 70–99)
Glucose-Capillary: <10 mg/dL — CL (ref 70–99)

## 2023-10-27 LAB — CBC
HCT: 37.4 % — ABNORMAL LOW (ref 39.0–52.0)
Hemoglobin: 11.1 g/dL — ABNORMAL LOW (ref 13.0–17.0)
MCH: 29.3 pg (ref 26.0–34.0)
MCHC: 29.7 g/dL — ABNORMAL LOW (ref 30.0–36.0)
MCV: 98.7 fL (ref 80.0–100.0)
Platelets: 298 10*3/uL (ref 150–400)
RBC: 3.79 MIL/uL — ABNORMAL LOW (ref 4.22–5.81)
RDW: 15.9 % — ABNORMAL HIGH (ref 11.5–15.5)
WBC: 15.6 10*3/uL — ABNORMAL HIGH (ref 4.0–10.5)
nRBC: 0.1 % (ref 0.0–0.2)

## 2023-10-27 LAB — PHOSPHORUS: Phosphorus: 7.3 mg/dL — ABNORMAL HIGH (ref 2.5–4.6)

## 2023-10-27 LAB — C-REACTIVE PROTEIN: CRP: 13 mg/dL — ABNORMAL HIGH (ref <1.0)

## 2023-10-27 LAB — BLOOD GAS, ARTERIAL
Acid-Base Excess: 1.6 mmol/L (ref 0.0–2.0)
Bicarbonate: 29.2 mmol/L — ABNORMAL HIGH (ref 20.0–28.0)
FIO2: 21 %
O2 Saturation: 88.7 %
Patient temperature: 37
pCO2 arterial: 58 mmHg — ABNORMAL HIGH (ref 32–48)
pH, Arterial: 7.31 — ABNORMAL LOW (ref 7.35–7.45)
pO2, Arterial: 56 mmHg — ABNORMAL LOW (ref 83–108)

## 2023-10-27 LAB — IRON AND TIBC
Iron: 20 ug/dL — ABNORMAL LOW (ref 45–182)
Saturation Ratios: 6 % — ABNORMAL LOW (ref 17.9–39.5)
TIBC: 316 ug/dL (ref 250–450)
UIBC: 296 ug/dL

## 2023-10-27 LAB — LIPID PANEL
Cholesterol: 129 mg/dL (ref 0–200)
HDL: 21 mg/dL — ABNORMAL LOW (ref >40)
LDL Cholesterol: 84 mg/dL (ref 0–99)
Total CHOL/HDL Ratio: 6.1 ratio
Triglycerides: 122 mg/dL (ref <150)
VLDL: 24 mg/dL (ref 0–40)

## 2023-10-27 LAB — LACTIC ACID, PLASMA
Lactic Acid, Venous: 0.7 mmol/L (ref 0.5–1.9)
Lactic Acid, Venous: 1 mmol/L (ref 0.5–1.9)
Lactic Acid, Venous: 1 mmol/L (ref 0.5–1.9)
Lactic Acid, Venous: 1.8 mmol/L (ref 0.5–1.9)

## 2023-10-27 LAB — D-DIMER, QUANTITATIVE: D-Dimer, Quant: 1.81 ug{FEU}/mL — ABNORMAL HIGH (ref 0.00–0.50)

## 2023-10-27 LAB — HEMOGLOBIN A1C
Hgb A1c MFr Bld: 8.9 % — ABNORMAL HIGH (ref 4.8–5.6)
Mean Plasma Glucose: 208.73 mg/dL

## 2023-10-27 LAB — MRSA NEXT GEN BY PCR, NASAL: MRSA by PCR Next Gen: NOT DETECTED

## 2023-10-27 LAB — VITAMIN B12: Vitamin B-12: 210 pg/mL (ref 180–914)

## 2023-10-27 LAB — BRAIN NATRIURETIC PEPTIDE: B Natriuretic Peptide: 67.3 pg/mL (ref 0.0–100.0)

## 2023-10-27 LAB — OSMOLALITY: Osmolality: 293 mosm/kg (ref 275–295)

## 2023-10-27 LAB — RESP PANEL BY RT-PCR (RSV, FLU A&B, COVID)  RVPGX2
Influenza A by PCR: NEGATIVE
Influenza B by PCR: NEGATIVE
Resp Syncytial Virus by PCR: NEGATIVE
SARS Coronavirus 2 by RT PCR: NEGATIVE

## 2023-10-27 LAB — VITAMIN D 25 HYDROXY (VIT D DEFICIENCY, FRACTURES): Vit D, 25-Hydroxy: 8.89 ng/mL — ABNORMAL LOW (ref 30–100)

## 2023-10-27 LAB — HIV ANTIBODY (ROUTINE TESTING W REFLEX): HIV Screen 4th Generation wRfx: NONREACTIVE

## 2023-10-27 LAB — T4, FREE: Free T4: 0.86 ng/dL (ref 0.61–1.12)

## 2023-10-27 LAB — TSH: TSH: 0.886 u[IU]/mL (ref 0.350–4.500)

## 2023-10-27 LAB — FOLATE: Folate: 12.4 ng/mL (ref >5.9)

## 2023-10-27 LAB — MAGNESIUM: Magnesium: 2.2 mg/dL (ref 1.7–2.4)

## 2023-10-27 MED ORDER — LORAZEPAM 0.5 MG PO TABS
0.5000 mg | ORAL_TABLET | Freq: Three times a day (TID) | ORAL | Status: DC | PRN
  Administered 2023-10-27: 0.5 mg via ORAL
  Filled 2023-10-27: qty 1

## 2023-10-27 MED ORDER — NOREPINEPHRINE 4 MG/250ML-% IV SOLN
0.0000 ug/min | INTRAVENOUS | Status: DC
  Administered 2023-10-28 (×2): 2 ug/min via INTRAVENOUS
  Filled 2023-10-27: qty 250

## 2023-10-27 MED ORDER — DEXTROSE 50 % IV SOLN
12.5000 g | INTRAVENOUS | Status: AC
  Administered 2023-10-27: 12.5 g via INTRAVENOUS

## 2023-10-27 MED ORDER — MIDAZOLAM HCL 2 MG/2ML IJ SOLN
INTRAMUSCULAR | Status: AC
  Administered 2023-10-27: 2 mg
  Filled 2023-10-27: qty 2

## 2023-10-27 MED ORDER — GLUCAGON HCL RDNA (DIAGNOSTIC) 1 MG IJ SOLR
1.0000 mg | Freq: Once | INTRAMUSCULAR | Status: AC
Start: 2023-10-27 — End: 2023-10-27
  Administered 2023-10-27: 1 mg via INTRAVENOUS
  Filled 2023-10-27: qty 1

## 2023-10-27 MED ORDER — HALOPERIDOL LACTATE 5 MG/ML IJ SOLN: 2.0000 mg | Freq: Four times a day (QID) | INTRAMUSCULAR | Status: DC | PRN

## 2023-10-27 MED ORDER — DEXTROSE 10 % IV SOLN: INTRAVENOUS | Status: DC

## 2023-10-27 MED ORDER — FENTANYL CITRATE PF 50 MCG/ML IJ SOSY
25.0000 ug | PREFILLED_SYRINGE | Freq: Once | INTRAMUSCULAR | Status: AC
  Administered 2023-10-27: 25 ug via INTRAVENOUS

## 2023-10-27 MED ORDER — DULOXETINE HCL 30 MG PO CPEP: 60.0000 mg | ORAL_CAPSULE | Freq: Every day | ORAL | Status: DC

## 2023-10-27 MED ORDER — ORAL CARE MOUTH RINSE
15.0000 mL | OROMUCOSAL | Status: DC
  Administered 2023-10-27 – 2023-10-29 (×18): 15 mL via OROMUCOSAL

## 2023-10-27 MED ORDER — ATORVASTATIN CALCIUM 20 MG PO TABS
20.0000 mg | ORAL_TABLET | Freq: Every day | ORAL | Status: DC
Start: 2023-10-27 — End: 2023-10-27
  Filled 2023-10-27: qty 1

## 2023-10-27 MED ORDER — FENTANYL CITRATE (PF) 100 MCG/2ML IJ SOLN
INTRAMUSCULAR | Status: AC
  Filled 2023-10-27: qty 2

## 2023-10-27 MED ORDER — POLYSACCHARIDE IRON COMPLEX 150 MG PO CAPS: 150.0000 mg | ORAL_CAPSULE | Freq: Every day | ORAL | Status: DC

## 2023-10-27 MED ORDER — FENTANYL 2500MCG IN NS 250ML (10MCG/ML) PREMIX INFUSION
0.0000 ug/h | INTRAVENOUS | Status: DC
  Administered 2023-10-27: 25 ug/h via INTRAVENOUS
  Filled 2023-10-27 (×2): qty 250

## 2023-10-27 MED ORDER — CHLORHEXIDINE GLUCONATE CLOTH 2 % EX PADS
6.0000 | MEDICATED_PAD | Freq: Every day | CUTANEOUS | Status: DC
  Administered 2023-10-27 – 2023-10-31 (×5): 6 via TOPICAL

## 2023-10-27 MED ORDER — ATORVASTATIN CALCIUM 20 MG PO TABS
20.0000 mg | ORAL_TABLET | Freq: Every day | ORAL | Status: DC
  Filled 2023-10-27: qty 1

## 2023-10-27 MED ORDER — FENTANYL CITRATE PF 50 MCG/ML IJ SOSY
PREFILLED_SYRINGE | INTRAMUSCULAR | Status: AC
Start: 2023-10-27 — End: 2023-10-27
  Administered 2023-10-27: 50 ug
  Filled 2023-10-27: qty 2

## 2023-10-27 MED ORDER — SODIUM CHLORIDE 0.9 % IV SOLN: INTRAVENOUS | Status: DC

## 2023-10-27 MED ORDER — DOCUSATE SODIUM 50 MG/5ML PO LIQD
100.0000 mg | Freq: Two times a day (BID) | ORAL | Status: DC
  Administered 2023-10-27: 100 mg
  Filled 2023-10-27 (×3): qty 10

## 2023-10-27 MED ORDER — DULOXETINE HCL 20 MG PO CPEP
20.0000 mg | ORAL_CAPSULE | Freq: Every day | ORAL | Status: DC
  Filled 2023-10-27: qty 1

## 2023-10-27 MED ORDER — IPRATROPIUM-ALBUTEROL 0.5-2.5 (3) MG/3ML IN SOLN: 3.0000 mL | Freq: Four times a day (QID) | RESPIRATORY_TRACT | Status: DC | PRN

## 2023-10-27 MED ORDER — POLYSACCHARIDE IRON COMPLEX 150 MG PO CAPS
150.0000 mg | ORAL_CAPSULE | Freq: Every day | ORAL | Status: DC
  Filled 2023-10-27: qty 1

## 2023-10-27 MED ORDER — ASPIRIN 81 MG PO CHEW
81.0000 mg | CHEWABLE_TABLET | Freq: Every day | ORAL | Status: DC
  Filled 2023-10-27: qty 1

## 2023-10-27 MED ORDER — POLYETHYLENE GLYCOL 3350 17 G PO PACK
17.0000 g | PACK | Freq: Every day | ORAL | Status: DC
  Administered 2023-10-27: 17 g
  Filled 2023-10-27: qty 1

## 2023-10-27 MED ORDER — VITAMIN B-12 1000 MCG PO TABS
1000.0000 ug | ORAL_TABLET | Freq: Every day | ORAL | Status: DC
  Administered 2023-11-03 – 2023-11-09 (×7): 1000 ug via ORAL
  Filled 2023-10-27 (×7): qty 1

## 2023-10-27 MED ORDER — METHYLPREDNISOLONE SODIUM SUCC 125 MG IJ SOLR
125.0000 mg | Freq: Once | INTRAMUSCULAR | Status: DC
  Filled 2023-10-27: qty 2

## 2023-10-27 MED ORDER — FENTANYL BOLUS VIA INFUSION
25.0000 ug | INTRAVENOUS | Status: DC | PRN
  Administered 2023-10-28 (×2): 50 ug via INTRAVENOUS

## 2023-10-27 MED ORDER — PREGABALIN 25 MG PO CAPS
25.0000 mg | ORAL_CAPSULE | Freq: Three times a day (TID) | ORAL | Status: DC
  Administered 2023-10-27 (×2): 25 mg via ORAL
  Filled 2023-10-27 (×3): qty 1

## 2023-10-27 MED ORDER — CHLORHEXIDINE GLUCONATE 4 % EX SOLN
60.0000 mL | Freq: Once | CUTANEOUS | Status: AC
  Administered 2023-10-27: 4 via TOPICAL

## 2023-10-27 MED ORDER — ORAL CARE MOUTH RINSE: 15.0000 mL | OROMUCOSAL | Status: DC | PRN

## 2023-10-27 MED ORDER — BUSPIRONE HCL 10 MG PO TABS
15.0000 mg | ORAL_TABLET | Freq: Three times a day (TID) | ORAL | Status: DC
  Administered 2023-10-27 (×2): 15 mg via ORAL
  Filled 2023-10-27: qty 3
  Filled 2023-10-27: qty 2

## 2023-10-27 MED ORDER — CLONIDINE HCL 0.1 MG PO TABS
0.2000 mg | ORAL_TABLET | Freq: Three times a day (TID) | ORAL | Status: DC
  Administered 2023-10-27: 0.2 mg via ORAL
  Filled 2023-10-27: qty 2

## 2023-10-27 MED ORDER — CEFAZOLIN SODIUM-DEXTROSE 2-4 GM/100ML-% IV SOLN
2.0000 g | INTRAVENOUS | Status: DC
  Filled 2023-10-27: qty 100

## 2023-10-27 MED ORDER — SEVOFLURANE IN SOLN
RESPIRATORY_TRACT | Status: AC
  Filled 2023-10-27: qty 250

## 2023-10-27 MED ORDER — VITAMIN C 500 MG PO TABS
500.0000 mg | ORAL_TABLET | Freq: Every day | ORAL | Status: DC
  Administered 2023-10-29: 500 mg via ORAL
  Filled 2023-10-27 (×2): qty 1

## 2023-10-27 MED ORDER — PROPOFOL 1000 MG/100ML IV EMUL
0.0000 ug/kg/min | INTRAVENOUS | Status: DC
  Administered 2023-10-27: 5 ug/kg/min via INTRAVENOUS
  Administered 2023-10-28 (×2): 35 ug/kg/min via INTRAVENOUS
  Administered 2023-10-28: 30 ug/kg/min via INTRAVENOUS
  Administered 2023-10-29: 55 ug/kg/min via INTRAVENOUS
  Filled 2023-10-27 (×10): qty 100

## 2023-10-27 MED ORDER — GLUCAGON HCL (RDNA) 1 MG IJ SOLR: 1.0000 mg | Freq: Once | INTRAMUSCULAR | Status: DC

## 2023-10-27 MED ORDER — CHLORHEXIDINE GLUCONATE 4 % EX SOLN
60.0000 mL | Freq: Once | CUTANEOUS | Status: AC
  Administered 2023-10-28: 4 via TOPICAL

## 2023-10-27 MED ORDER — FAMOTIDINE IN NACL 20-0.9 MG/50ML-% IV SOLN
20.0000 mg | INTRAVENOUS | Status: DC
  Administered 2023-10-27: 20 mg via INTRAVENOUS
  Filled 2023-10-27 (×2): qty 50

## 2023-10-27 MED ORDER — SODIUM CHLORIDE 0.9 % IV SOLN
250.0000 mL | INTRAVENOUS | Status: AC
  Administered 2023-10-27: 250 mL via INTRAVENOUS

## 2023-10-27 MED ORDER — CYANOCOBALAMIN 1000 MCG/ML IJ SOLN
1000.0000 ug | Freq: Every day | INTRAMUSCULAR | Status: AC
  Administered 2023-10-27 – 2023-11-02 (×7): 1000 ug via INTRAMUSCULAR
  Filled 2023-10-27 (×7): qty 1

## 2023-10-27 MED ORDER — IOHEXOL 350 MG/ML SOLN
100.0000 mL | Freq: Once | INTRAVENOUS | Status: AC | PRN
Start: 2023-10-27 — End: 2023-10-27
  Administered 2023-10-27: 100 mL via INTRAVENOUS

## 2023-10-27 MED ORDER — NOREPINEPHRINE 4 MG/250ML-% IV SOLN
INTRAVENOUS | Status: AC
  Filled 2023-10-27: qty 250

## 2023-10-27 MED ORDER — CEFAZOLIN SODIUM-DEXTROSE 2-4 GM/100ML-% IV SOLN
2.0000 g | INTRAVENOUS | Status: AC
  Administered 2023-10-28: 2 g via INTRAVENOUS
  Filled 2023-10-27: qty 100

## 2023-10-27 MED ORDER — NITROGLYCERIN 0.4 MG SL SUBL: 0.4000 mg | SUBLINGUAL_TABLET | SUBLINGUAL | Status: DC | PRN

## 2023-10-27 MED ORDER — BUDESONIDE 0.25 MG/2ML IN SUSP
0.2500 mg | Freq: Two times a day (BID) | RESPIRATORY_TRACT | Status: DC
  Administered 2023-10-27 – 2023-11-08 (×24): 0.25 mg via RESPIRATORY_TRACT
  Filled 2023-10-27 (×24): qty 2

## 2023-10-27 MED ORDER — ARFORMOTEROL TARTRATE 15 MCG/2ML IN NEBU
15.0000 ug | INHALATION_SOLUTION | Freq: Two times a day (BID) | RESPIRATORY_TRACT | Status: DC
  Administered 2023-10-27 – 2023-11-08 (×21): 15 ug via RESPIRATORY_TRACT
  Filled 2023-10-27 (×26): qty 2

## 2023-10-27 MED ORDER — FUROSEMIDE 10 MG/ML IJ SOLN
40.0000 mg | Freq: Two times a day (BID) | INTRAMUSCULAR | Status: DC
  Filled 2023-10-27: qty 4

## 2023-10-27 MED ORDER — ORAL CARE MOUTH RINSE
15.0000 mL | OROMUCOSAL | Status: DC
  Administered 2023-10-27: 15 mL via OROMUCOSAL

## 2023-10-27 MED ORDER — LOSARTAN POTASSIUM-HCTZ 100-25 MG PO TABS: 1.0000 | ORAL_TABLET | Freq: Every day | ORAL | Status: DC

## 2023-10-27 MED ORDER — MIDAZOLAM HCL 2 MG/2ML IJ SOLN
INTRAMUSCULAR | Status: AC
  Filled 2023-10-27: qty 2

## 2023-10-27 MED ORDER — ROCURONIUM BROMIDE 10 MG/ML (PF) SYRINGE
PREFILLED_SYRINGE | INTRAVENOUS | Status: AC
  Administered 2023-10-27: 60 mg
  Filled 2023-10-27: qty 10

## 2023-10-27 NOTE — Progress Notes (Signed)
 1245 - Patient came to floor under IVC per ED RN and Psych MD. Patient to be reevaluated once stabilized.Sitter at bedside.   MD at bedside. Patient reevaluated. To d/c IVC   1630- RN made aware IVC papers discontinued by ED and MD. Papers to be sent up. Will place in chart.

## 2023-10-27 NOTE — Progress Notes (Addendum)
 Patient with eyedrops and nasal saline. Given to wife to take home. Clothing in patient belongings bag.   Patients spouse ask about patients cell phone. ED contacted to look for cell phone. ED to send phone up.   Cell phone delivered to patient, cell phone at bedside now.

## 2023-10-27 NOTE — Consult Note (Addendum)
 [facility] Psychiatric Consult Initial  Patient Name: .Martin Riddle  MRN: [id number]  DOB: October 04, 1970  Consult Order details:  Orders (From admission, onward)     Start     Ordered   10/27/23 1109  IP CONSULT TO PSYCHIATRY       Ordering Provider: Althia Atlas, MD  Provider:  Aurelia Blotter, MD  Question Answer Comment  Location [facility]   Reason for Consult? mental capacity to make healthcare dcisions. Ty      10/27/23 1109             Mode of Visit: In person    Psychiatry Consult Evaluation  Service Date: Oct 27, 2023 LOS:  LOS: 1 day  Chief Complaint Capacity to make medical decision  Primary Psychiatric Diagnoses  Adjustment disorder with depressed mood 2.   3.    Assessment  Martin Riddle is a 53 y.o. male admitted: Medicallyfor 10/26/2023  7:29 PM with medical history significant of DM, atherosclerosis of native arteries of the extremities with ulceration, diabetic foot ulcer, HTN, HLD, depression, chronic venous stasis in both legs, pancreatitis, peripheral neuropathy, morbid obesity with BMI of 47, who presents with right foot wound infection.  Right foot wound is progressively worsening for past 3 weeks and foul-smelling. patient was seen by vascular surgeon, Dr. Vonna Guardian on 5/13 and had a negative DVT study.  Dr. Vonna Guardian recommended an angiography with possible revascularization. In the ED pt was found to have WBC 11.3, lactic acid 2.3, GFR> 60, temperature normal, blood pressure 139/69, heart rate 99 --> 103, RR 25, oxygen saturation 96% on room air.Patient initially wanted to leave the hospital and psychiatry is consulted to evaluate the capacity to make medical decisions. Patient had a rapid response in the Ed and ended up in the ED.  On assessment patient is able to answer many orientation questions regarding the current location and situation.  He is able to answer his medical problems on the current treatment recommended for him.  He  talked about his history of depression and agreed upon getting treated.  He remains future oriented and wants his granddaughter lives with him in the hospital.  He does have capacity to make medical decisions.  We will adjust his Cymbalta  by increasing to 20 mg nightly and 60 mg daily  Diagnoses:  Active Hospital problems: Principal Problem:   Diabetic foot infection (HCC) Active Problems:   Atherosclerosis of native arteries of the extremities with ulceration (HCC)   HTN (hypertension)   HLD (hyperlipidemia)   Type 2 diabetes mellitus with peripheral neuropathy (HCC)   Obesity, Class III, BMI 40-49.9 (morbid obesity)   Severe sepsis (HCC)   Depression   Osteomyelitis of right foot (HCC)   Hypoglycemia   Gas gangrene (HCC)    Plan   ## Psychiatric Medication Recommendations:  Increased Cymbalata to 60 mg daily + 20 mg at bedtime Continue Buspar  Added hydroxyzine 25 mg PRN anxiety Q6 hrs.  ## Medical Decision Making Capacity: Patient has medical decision making capacity  ## Further Work-up:  -- IVC was discontinued on 10/27/23  -- most recent EKG none recent in chart --   ## Disposition:-- There are no psychiatric contraindications to discharge at this time  ## Behavioral / Environmental: -Delirium Precautions: Delirium Interventions for Nursing and Staff: - RN to open blinds every AM. - To Bedside: Glasses, hearing aide, and pt's own shoes. Make available to patients. when possible and encourage use. - Encourage po fluids when  appropriate, keep fluids within reach. - OOB to chair with meals. - Passive ROM exercises to all extremities with AM & PM care. - RN to assess orientation to person, time and place QAM and PRN. - Recommend extended visitation hours with familiar family/friends as feasible. - Staff to minimize disturbances at night. Turn off television when pt asleep or when not in use.    ## Safety and Observation Level:  - Based on my clinical evaluation, I estimate the  patient to be at LOW risk of self harm in the current setting. - At this time, we recommend  routine. This decision is based on my review of the chart including patient's history and current presentation, interview of the patient, mental status examination, and consideration of suicide risk including evaluating suicidal ideation, plan, intent, suicidal or self-harm behaviors, risk factors, and protective factors. This judgment is based on our ability to directly address suicide risk, implement suicide prevention strategies, and develop a safety plan while the patient is in the clinical setting. Please contact our team if there is a concern that risk level has changed.  CSSR Risk Category:C-SSRS RISK CATEGORY: No Risk  Suicide Risk Assessment: Patient has following modifiable risk factors for suicide: lack of access to outpatient mental health resources, which we are addressing by connecting him to outpatient MH services. Patient has following non-modifiable or demographic risk factors for suicide: male gender Patient has the following protective factors against suicide: Supportive family, Cultural, spiritual, or religious beliefs that discourage suicide, and Minor children in the home  Thank you for this consult request. Recommendations have been communicated to the primary team.  We will sign off at this time.   Aurelia Blotter, MD       History of Present Illness  Martin Riddle is a 53 y.o. male admitted: Medicallyfor 10/26/2023  7:29 PM with medical history significant of DM, atherosclerosis of native arteries of the extremities with ulceration, diabetic foot ulcer, HTN, HLD, depression, chronic venous stasis in both legs, pancreatitis, peripheral neuropathy, morbid obesity with BMI of 47, who presents with right foot wound infection.  Right foot wound is progressively worsening for past 3 weeks and foul-smelling. patient was seen by vascular surgeon, Dr. Vonna Guardian on 5/13 and had a negative DVT  study.  Dr. Vonna Guardian recommended an angiography with possible revascularization. In the ED pt was found to have WBC 11.3, lactic acid 2.3, GFR> 60, temperature normal, blood pressure 139/69, heart rate 99 --> 103, RR 25, oxygen saturation 96% on room air.Patient initially wanted to leave the hospital and psychiatry is consulted to evaluate the capacity to make medical decisions. Patient had a rapid response in the Ed and ended up in the ED. On interview patient is noted to be resting in bed.  He was able to identify the location as [facility].  We talked about the wound on his leg that has been getting worse in the last few weeks.  He expressed his understanding of his blood sugars dropping and having difficulty with breathing.  Per primary team patient has agreed to participate in his treatment as of now.  Patient endorses history of depression, denies hopelessness, denies anhedonia, reports poor energy and motivation denies suicidal/homicidal ideation/plan.  He denies auditory/visual hallucinations.  He denies any history of abuse and denies nightmares or flashbacks.  He reports significant amount of anxiety and describes them as panic attacks.  He also acknowledged that he started having shortness of breath more often recently.  He denies  recent or previous episodes of mania/hypomania.  He remains future oriented and states that he does not mind "going to the house of George Kinder" but reports  he wants to continue for his granddaughter.  He talked about his granddaughter being 98 year old and he requested the medical team to allow his granddaughter to come visit him in the hospital.    Able to understand medical problem Patient is able to express his understanding of the wound on his leg, its condition of getting worse and the need for treatment. Able to understand proposed treatment  Patient and the primary team were able to have a good discussion about the proposed treatment, pros and cons of the proposed  treatment  Able to understand alternative to proposed treatment (if any)  Patient expressed his desire to participate in treatment at this time   Able to understand option of refusing proposed treatment (including withholding or withdrawing proposed treatment) Patient is able to express his understanding of refusing proposed treatment including worsening sepsis or circulation problems  Able to appreciate reasonably foreseeable consequences of accepting proposed treatment   He also expressed his understanding of accepting the proposed treatment Able to appreciate reasonable foreseeable consequences of refusing proposed treatment (including withholding or  withdrawing proposed treatment)   Patient is able to appreciate the foreseeable consequences of refusing proposed treatment like aggressive measures like amputation. Collateral information:  Contacted wife who informed that patient has multiple medical problems.  She informed that he used to go to pain clinic and was on aggressive doses of opioids.  When a new provider came they had a conflict and patient was escorted out of the pain clinic and since then he has poor pain management.  Since then patient has been bedbound or sits in a recliner all day unable to mobilize and unable to do any activities.  Wife reports patient having history of depression, poor self-care and patient being currently on Cymbalta .  She reports patient having fair appetite but poor sleep.  ROS   Psychiatric and Social History  Psychiatric History:  Information collected from patient/wife  Prev Dx/Sx: Depression and anxiety Current Psych Provider: None currently Home Meds (current): Cymbalta  Previous Med Trials: Zoloft Therapy: None reported  Prior Psych Hospitalization: August 20 2:18 sisters. Prior Self Harm: denies Prior Violence: denies  Family Psych History: History of depression Family Hx suicide: Denies  Social History:   Educational Hx:  Unknown Occupational Hx: On disability Legal Hx: None reported Living Situation: With wife has 1 son and daughter and 2 grandchildren Spiritual Hx: Unknown Access to weapons/lethal means: pistol t is locked in the safe box  Substance History Alcohol: Denies  Tobacco: Denies Illicit drugs: Denies Prescription drug abuse: Denies Rehab hx: Denies  Exam Findings  Physical Exam: Reviewed the chart and agree with the physical exam findings conducted by the medical provider Vital Signs:  Temp:  [98.2 F (36.8 C)-100.1 F (37.8 C)] 98.3 F (36.8 C) (05/20 1247) Pulse Rate:  [61-154] 81 (05/20 1500) Resp:  [17-48] 21 (05/20 1500) BP: (113-174)/(53-121) 121/65 (05/20 1500) SpO2:  [81 %-100 %] 92 % (05/20 1500) FiO2 (%):  [30 %] 30 % (05/20 1205) Weight:  [150.2 kg-181.4 kg] 150.2 kg (05/20 1247) Blood pressure 121/65, pulse 81, temperature 98.3 F (36.8 C), temperature source Oral, resp. rate (!) 21, height 6\' 5"  (1.956 m), weight (!) 150.2 kg, SpO2 92%. Body mass index is 39.27 kg/m.    Mental Status Exam: General Appearance: Fairly Groomed  Orientation:  Full (Time,  Place, and Person)  Memory:  Immediate;   Fair Recent;   Fair Remote;   Fair  Concentration:  Concentration: Fair and Attention Span: Fair  Recall:  Fair  Attention  Fair  Eye Contact:  Fair  Speech:  Clear and Coherent  Language:  Fair  Volume:  Decreased  Mood: fine  Affect:  Appropriate  Thought Process:  Coherent  Thought Content:  Logical  Suicidal Thoughts:  No  Homicidal Thoughts:  No  Judgement:  Fair  Insight:  Fair  Psychomotor Activity:  Decreased  Akathisia:  No  Fund of Knowledge:  Fair      Assets:  Manufacturing systems engineer Desire for Improvement Housing Resilience Social Support  Cognition:  WNL  ADL's:  Impaired  AIMS (if indicated):        Other History   These have been pulled in through the EMR, reviewed, and updated if appropriate.  Family History:  The patient's family  history includes Diabetes in his mother and sister; Heart disease in his mother.  Medical History: Past Medical History:  Diagnosis Date   Diabetes mellitus without complication (HCC)    Hypercholesteremia    Hypertension    Pancreatitis     Surgical History: Past Surgical History:  Procedure Laterality Date   ANKLE SURGERY Left 2001,2003   BACK SURGERY  [phone]     Medications:   Current Facility-Administered Medications:    acetaminophen  (TYLENOL ) tablet 650 mg, 650 mg, Oral, Q6H PRN, Niu, Xilin, MD, 650 mg at 10/26/23 2205   arformoterol (BROVANA) nebulizer solution 15 mcg, 15 mcg, Nebulization, BID, Althia Atlas, MD   Cecily Cohen ON 10/29/2023] ascorbic acid (VITAMIN C) tablet 500 mg, 500 mg, Oral, Daily, Althia Atlas, MD   aspirin  EC tablet 81 mg, 81 mg, Oral, Daily, Niu, Xilin, MD   atorvastatin  (LIPITOR) tablet 20 mg, 20 mg, Oral, Daily, Niu, Xilin, MD   budesonide  (PULMICORT ) nebulizer solution 0.25 mg, 0.25 mg, Nebulization, BID, Althia Atlas, MD   busPIRone  (BUSPAR ) tablet 15 mg, 15 mg, Oral, TID, Niu, Xilin, MD, 15 mg at 10/27/23 0147   Chlorhexidine  Gluconate Cloth 2 % PADS 6 each, 6 each, Topical, Daily, Althia Atlas, MD, 6 each at 10/27/23 1253   cloNIDine  (CATAPRES ) tablet 0.2 mg, 0.2 mg, Oral, TID, Althia Atlas, MD   cyanocobalamin (VITAMIN B12) injection 1,000 mcg, 1,000 mcg, Intramuscular, Q1200 **FOLLOWED BY** [START ON 11/03/2023] cyanocobalamin (VITAMIN B12) tablet 1,000 mcg, 1,000 mcg, Oral, Daily, Althia Atlas, MD   dextrose  10 % infusion, , Intravenous, Continuous, Althia Atlas, MD, Last Rate: 75 mL/hr at 10/27/23 1256, Rate Change at 10/27/23 1256   dextrose  50 % solution 50 mL, 50 mL, Intravenous, PRN, Niu, Xilin, MD, 50 mL at 10/27/23 1447   DULoxetine  (CYMBALTA ) DR capsule 60 mg, 60 mg, Oral, Daily, Niu, Xilin, MD   furosemide  (LASIX ) injection 40 mg, 40 mg, Intravenous, BID, Althia Atlas, MD   haloperidol  lactate (HALDOL ) injection 2 mg, 2 mg,  Intravenous, Q6H PRN, Althia Atlas, MD   heparin  injection 5,000 Units, 5,000 Units, Subcutaneous, Q8H, Niu, Xilin, MD, 5,000 Units at 10/27/23 1449   hydrALAZINE  (APRESOLINE ) injection 5 mg, 5 mg, Intravenous, Q2H PRN, Niu, Xilin, MD   HYDROmorphone  (DILAUDID ) injection 1 mg, 1 mg, Intravenous, Q3H PRN, Niu, Xilin, MD, 1 mg at 10/27/23 1431   ipratropium-albuterol  (DUONEB) 0.5-2.5 (3) MG/3ML nebulizer solution 3 mL, 3 mL, Nebulization, Q6H PRN, Althia Atlas, MD   Cecily Cohen ON 10/29/2023] iron polysaccharides (NIFEREX) capsule 150 mg, 150 mg, Oral, Daily,  Althia Atlas, MD   LORazepam  (ATIVAN ) tablet 0.5 mg, 0.5 mg, Oral, Q8H PRN, Niu, Xilin, MD, 0.5 mg at 10/27/23 0147   methylPREDNISolone  sodium succinate (SOLU-MEDROL ) 125 mg/2 mL injection 125 mg, 125 mg, Intravenous, Once, Althia Atlas, MD   nitroGLYCERIN (NITROSTAT) SL tablet 0.4 mg, 0.4 mg, Sublingual, Q5 min PRN, Niu, Xilin, MD   ondansetron  (ZOFRAN ) injection 4 mg, 4 mg, Intravenous, Q8H PRN, Niu, Xilin, MD, 4 mg at 10/26/23 2124   Oral care mouth rinse, 15 mL, Mouth Rinse, 4 times per day, Althia Atlas, MD   Oral care mouth rinse, 15 mL, Mouth Rinse, PRN, Althia Atlas, MD   oxyCODONE -acetaminophen  (PERCOCET/ROXICET) 5-325 MG per tablet 1 tablet, 1 tablet, Oral, Q4H PRN, Niu, Xilin, MD, 1 tablet at 10/27/23 0458   piperacillin -tazobactam (ZOSYN ) IVPB 3.375 g, 3.375 g, Intravenous, Q8H, Niu, Xilin, MD, Stopped at 10/27/23 1610   pregabalin  (LYRICA ) capsule 25 mg, 25 mg, Oral, TID, Niu, Xilin, MD, 25 mg at 10/27/23 0146   vancomycin  (VANCOREADY) IVPB 1750 mg/350 mL, 1,750 mg, Intravenous, Q12H, Niu, Xilin, MD  Allergies: Allergies  Allergen Reactions   Morphine Other (See Comments)    headache     A HEADACHE AND SWEATING AND LETHARGIC headache  headache, ,  A HEADACHE AND SWEATING AND LETHARGIC headache   Morphine And Codeine Other (See Comments)    headache   Other Other (See Comments)    A HEADACHE AND SWEATING AND LETHARGIC     headache  A HEADACHE AND SWEATING AND LETHARGIC, , headache    Aurelia Blotter, MD

## 2023-10-27 NOTE — ED Notes (Signed)
 IV team at bedside

## 2023-10-27 NOTE — Progress Notes (Signed)
 Pharmacy Antibiotic Note  Martin Riddle is a 53 y.o. male admitted on 10/26/2023 with wound infection, osteomyelitis.  Pharmacy has been consulted for Vancomycin , Zosyn  dosing.    Plan: Zosyn  3.375g IV q8h (4 hour infusion).  Vancomycin  1 gm IV X 1 given in ED on 5/19 @ 2205 followed by additional Vancomycin  1500 mg to make total loading dose of 2500 mg.   Vancomycin  1750 mg IV Q12H ordered to start on 5/20 @ 1000.  AUC = 489.3 Vanc trough = 14.3   Height: 6\' 5"  (195.6 cm) Weight: (!) 181.4 kg (400 lb) IBW/kg (Calculated) : 89.1  Temp (24hrs), Avg:98.9 F (37.2 C), Min:98.2 F (36.8 C), Max:100.1 F (37.8 C)  Recent Labs  Lab 10/26/23 1817 10/26/23 2040 10/26/23 2331  WBC 11.3*  --   --   CREATININE 1.20  --   --   LATICACIDVEN 2.3* 1.6 1.8    Estimated Creatinine Clearance: 126.9 mL/min (by C-G formula based on SCr of 1.2 mg/dL).    Allergies  Allergen Reactions   Morphine Other (See Comments)    headache     A HEADACHE AND SWEATING AND LETHARGIC headache  headache, ,  A HEADACHE AND SWEATING AND LETHARGIC headache   Morphine And Codeine Other (See Comments)    headache   Other Other (See Comments)    A HEADACHE AND SWEATING AND LETHARGIC    headache  A HEADACHE AND SWEATING AND LETHARGIC, , headache    Antimicrobials this admission:   >>    >>   Dose adjustments this admission:   Microbiology results:  BCx:   UCx:    Sputum:    MRSA PCR:   Thank you for allowing pharmacy to be a part of this patient's care.  Eli Pattillo D 10/27/2023 1:29 AM

## 2023-10-27 NOTE — ED Notes (Signed)
 Pediatrist and Intensivist to bedside. Refusing to move forward with treatment plan.

## 2023-10-27 NOTE — ED Notes (Addendum)
 RN placed blue pad underneath pt to avoid soiling bed sheets further. Pt PIV then further secured with silk tape. Pt asked to remain in bed as to avoid losing further access while receiving IV fluids and antibiotics. Pt RR even and mildly labored from pt exertion. O2 saturation showing 99% on HFNC @10  lpm.

## 2023-10-27 NOTE — Progress Notes (Signed)
 [facility] Progress Note  Patient: Martin Riddle    UJW:[id number]  DOA: 10/26/2023     Date of Service: the patient was seen and examined on 10/27/2023  Chief Complaint  Patient presents with   Wound Infection   Brief hospital course: Martin Riddle is a 53 y.o. male with medical history significant of DM, atherosclerosis of native arteries of the extremities with ulceration, diabetic foot ulcer, HTN, HLD, depression, chronic venous stasis in both legs, pancreatitis, peripheral neuropathy, morbid obesity with BMI of 47, who presents with right foot wound infection.  Right foot wound is progressively worsening for past 3 weeks and foul-smelling. patient was seen by vascular surgeon, Dr. Vonna Guardian on 5/13 and had a negative DVT study.  Dr. Vonna Guardian recommended an angiography with possible revascularization.    Data reviewed independently and ED Course: pt was found to have WBC 11.3, lactic acid 2.3, GFR> 60, temperature normal, blood pressure 139/69, heart rate 99 --> 103, RR 25, oxygen saturation 96% on room air.  Patient is admitted to MedSurg bed as inpatient.  Dr. Prescilla Brod for VVS and Dr. Rosemarie Conquest of podiatry are consulted.      X-ray right foot: 1. Large soft tissue ulceration of the distal first toe with marked erosive changes of the first distal phalanx extending to the interphalangeal joint space. 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 2. Soft tissue gas surrounding the first proximal phalanx, concerning for infection with gas-forming organism.     Assessment and Plan:  # Severe sepsis due to diabetic foot infection with wound in right foot and right foot osteomyelitis: Patient meets criteria for severe sepsis with heart rate upto 103 and RR 25. Lactic acid 2.3.  Also has leukocytosis with WBC 11.3.   Continue vancomycin  and Zosyn  for now, pharmacy consulted. - PRN Zofran  for nausea, and Tylenol , dilaudid  and Percocet for pain - Blood cultures x 2  -  ESR and CRP evelated  - wound care consult - s/p IVF:  2L of NS bolus in ED - NPO after [place] Podiatry consulted, recommended tight his right foot is not salvageable. Vascular surgery consulted, plan is for abdominal amputation tomorrow a.m. ID consulted for antibiotics   # Acute hypoxic respiratory failure possible due to pulmonary edema, OSA, hypoventilation syndrome due to morbid obesity Negative COVID RSV and flu CTA chest negative for PE Continue supplemental O2 admission and gradually wean off Solu-Medrol  125 mg one-time dose ordered Started Brovana and Pulmicort  nebulizer Continue DuoNeb every 6 hourly as needed Started Lasix  40 mg IV twice daily Intensivist consulted   # Atherosclerosis of native arteries of the extremities with ulceration (HCC) - Dr. Prescilla Brod for VVS is consulted - continue lipitor - add ASA 81 mg daily   # Type 2 diabetes mellitus with peripheral neuropathy (HCC): Recent A1c 6.3, controlled.  Patient is taking NovoLog  and Lantus  - SSI - -hold long-lasting insulin  5/20 hypoglycemia, patient was started on D10 IV fluid and hypoglycemia protocol will be followed. Monitor CBG   HTN (hypertension): -IV hydralazine  as needed - will hold Losartan  due to soft  Bp now   HLD (hyperlipidemia) -Lipitor   Depression -Continue home medications Psych consulted to eval for patient's mental capacity to make health decisions.  As patient was refusing treatment and wanted to leave. Patient was IVC temporarily, which was rescinded.  # Vitamin B12 level 210, goal >400, started B12 IM injection during hospital stay followed by oral supplement.  # Iron deficiency, transferrin saturation 6%, started oral  iron supplement with vitamin C.  Follow with PCP repeat iron profile after 3 to 6 months.   BMI class III Body mass index is 39.27 kg/m.  Interventions: Calorie restricted diet and daily exercise advised to lose body weight.  Lifestyle modification  discussed.   Diet: Regular diet, keep n.p.o. after midnight DVT Prophylaxis: Subcutaneous Heparin     Advance goals of care discussion: Full code  Family Communication: family was present at bedside, at the time of interview.  The pt provided permission to discuss medical plan with the family. Opportunity was given to ask question and all questions were answered satisfactorily.   Disposition:  Pt is from Home, admitted with right foot osteomyelitis and gas gangrene, patient is scheduled for AKA tomorrow on 5/21 by vascular surgery, which precludes a safe discharge. Discharge to SNF, when stable and cleared by vascular surgery.  Subjective: No significant events overnight, in the ED patient was hypoglycemic, which was treated as per protocol.  Patient was very confused initially and refused all the treatment and wanted to leave, patient was convinced and I called patient's wife she came to the bedside and now patient is agreeable for the management and also agreed for the surgery.  Physical Exam: General: NAD, lying comfortably Appear in mild to moderate distress, affect depressed Eyes: PERRLA ENT: Oral Mucosa Clear, moist  Neck: no JVD,  Cardiovascular: S1 and S2 Present, no Murmur,  Respiratory: Increased respiratory effort, Bilateral Air entry equal and Decreased, mild Crackles, no wheezes Abdomen: Bowel Sound present, Soft and no tenderness,  Skin: no rashes Extremities: 4+ Pedal edema, right great toe foul-smelling chronic ulcer.  Chronic skin changes bilateral lower extremities due to PAD Neurologic: without any new focal findings Gait not checked due to patient safety concerns  Vitals:   10/27/23 0900 10/27/23 0945 10/27/23 0950 10/27/23 1247  BP:  (!) 174/77  122/66  Pulse: (!) 103 (!) 108 (!) 112 85  Resp:  (!) 30 (!) 48 (!) 39  Temp:  98.3 F (36.8 C)  98.3 F (36.8 C)  TempSrc:  Oral  Oral  SpO2: 91% 96% 95% 100%  Weight:    (!) 150.2 kg  Height:    6\' 5"  (1.956 m)    No intake or output data in the 24 hours ending 10/27/23 1423 Filed Weights   10/26/23 1804 10/27/23 1247  Weight: (!) 181.4 kg (!) 150.2 kg    Data Reviewed: I have personally reviewed and interpreted daily labs, tele strips, imagings as discussed above. I reviewed all nursing notes, pharmacy notes, vitals, pertinent old records I have discussed plan of care as described above with RN and patient/family.  CBC: Recent Labs  Lab 10/26/23 1817 10/27/23 0459  WBC 11.3* 15.6*  NEUTROABS 8.7*  --   HGB 10.9* 11.1*  HCT 36.2* 37.4*  MCV 96.5 98.7  PLT 238 298   Basic Metabolic Panel: Recent Labs  Lab 10/26/23 1817 10/27/23 0459  NA 131* 132*  K 4.0 4.9  CL 94* 97*  CO2 24 28  GLUCOSE 159* 134*  BUN 32* 34*  CREATININE 1.20 1.30*  CALCIUM  8.3* 8.0*  MG  --  2.2  PHOS  --  7.3*    Studies: CT Angio Chest Pulmonary Embolism (PE) W or WO Contrast Result Date: 10/27/2023 CLINICAL DATA:  Diabetic right foot ulceration and hypoxia. EXAM: CT ANGIOGRAPHY CHEST WITH CONTRAST TECHNIQUE: Multidetector CT imaging of the chest was performed using the standard protocol during bolus administration of intravenous contrast. Multiplanar  CT image reconstructions and MIPs were obtained to evaluate the vascular anatomy. RADIATION DOSE REDUCTION: This exam was performed according to the departmental dose-optimization program which includes automated exposure control, adjustment of the mA and/or kV according to patient size and/or use of iterative reconstruction technique. CONTRAST:  OMNIPAQUE  IOHEXOL  350 MG/ML SOLN COMPARISON:  None Available. FINDINGS: Cardiovascular: Pulmonary arterial opacification is not optimal on the study. There is no evidence of significant central or lobar pulmonary embolism. The main pulmonary artery is top-normal in caliber measuring approximately 3 cm. The thoracic aorta is normal in caliber. No significant aortic atherosclerosis. The heart size is normal. No  pericardial fluid identified. Calcified coronary artery plaque present. Mediastinum/Nodes: Mild relative enlargement of the right lobe of the thyroid gland compared to the left with dystrophic calcifications in the right lobe and a small 6 mm low-density nodule in the superior right lobe. No lymphadenopathy identified. Lungs/Pleura: No overt pulmonary edema, pleural fluid or pneumothorax. Bibasilar atelectasis and scarring. No focal masses identified. Upper Abdomen: No acute abnormality. Musculoskeletal: No chest wall abnormality. No acute or significant osseous findings. Review of the MIP images confirms the above findings. IMPRESSION: 1. Suboptimal peripheral pulmonary arterial opacification. No evidence of significant central or lobar pulmonary embolism. 2. Bibasilar atelectasis and scarring. 3. Calcified coronary artery plaque. 4. Mild relative enlargement of the right lobe of the thyroid gland compared to the left with dystrophic calcifications in the right lobe and a small 6 mm low-density nodule in the superior right lobe. Not clinically significant; no follow-up imaging recommended (ref: J Am Coll Radiol. 2015 Feb;12(2): 143-50). Electronically Signed   By: Erica Hau M.D.   On: 10/27/2023 11:08   DG Chest Port 1 View Result Date: 10/27/2023 CLINICAL DATA:  Acute hypoxic respiratory failure. EXAM: PORTABLE CHEST 1 VIEW COMPARISON:  May 30, 2021. FINDINGS: The heart size and mediastinal contours are within normal limits. Stable minimal left basilar scarring. No acute pulmonary disease is noted. The visualized skeletal structures are unremarkable. IMPRESSION: No active disease. Electronically Signed   By: Rosalene Colon M.D.   On: 10/27/2023 10:50   DG Foot 2 Views Right Result Date: 10/26/2023 CLINICAL DATA:  Foot infection EXAM: RIGHT FOOT - 2 VIEW COMPARISON:  Right foot x-ray 05/30/2021. FINDINGS: There is diffuse soft tissue swelling of the foot, particularly the first toe. There is soft  tissue gas surrounding the first proximal phalanx. There is large soft tissue ulceration of the distal first toe. There are marked erosive changes of the mid and distal aspect of the first distal phalanx with fragmentation of the remaining proximal aspect of the first distal phalanx. This extends to the first interphalangeal joint space. The first proximal phalanx appears within normal limits. IMPRESSION: 1. Large soft tissue ulceration of the distal first toe with marked erosive changes of the first distal phalanx extending to the interphalangeal joint space. 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 2. Soft tissue gas surrounding the first proximal phalanx, concerning for infection with gas-forming organism. Electronically Signed   By: Tyron Gallon M.D.   On: 10/26/2023 22:11    Scheduled Meds:  arformoterol  15 mcg Nebulization BID   aspirin  EC  81 mg Oral Daily   atorvastatin   20 mg Oral Daily   budesonide  (PULMICORT ) nebulizer solution  0.25 mg Nebulization BID   busPIRone   15 mg Oral TID   Chlorhexidine  Gluconate Cloth  6 each Topical Daily   cloNIDine   0.2 mg Oral TID   DULoxetine   60 mg Oral Daily   furosemide   40 mg Intravenous BID   heparin   5,000 Units Subcutaneous Q8H   methylPREDNISolone  (SOLU-MEDROL ) injection  125 mg Intravenous Once   mouth rinse  15 mL Mouth Rinse 4 times per day   pregabalin   25 mg Oral TID   Continuous Infusions:  dextrose  75 mL/hr at 10/27/23 1256   lactated ringers  75 mL/hr at 10/27/23 0143   piperacillin -tazobactam (ZOSYN )  IV Stopped (10/27/23 0616)   vancomycin      PRN Meds: acetaminophen , dextrose , haloperidol  lactate, hydrALAZINE , HYDROmorphone  (DILAUDID ) injection, ipratropium-albuterol , LORazepam , nitroGLYCERIN, ondansetron  (ZOFRAN ) IV, mouth rinse, oxyCODONE -acetaminophen   Time spent: 75 minutes  Author: Althia Atlas. MD [facility] 10/27/2023 2:23 PM  To reach On-call, see care teams to locate the attending and  reach out to them via [email or web address]. If 7PM-7AM, please contact night-coverage If you still have difficulty reaching the attending provider, please page the [facility] (Director on Call) for [facility] on amion for assistance.

## 2023-10-27 NOTE — H&P (View-Only) (Signed)
 Hospital Consult    Reason for Consult:  Right foot wound infection  Requesting Physician:  Dr Fidencio Hue MD  MRN #:  [id number]  History of Present Illness: This is a 53 y.o. male with medical history significant of DM, atherosclerosis of native arteries of the extremities with ulceration, diabetic foot ulcer, HTN, HLD, depression, chronic venous stasis in both legs, pancreatitis, peripheral neuropathy, morbid obesity with BMI of 47, who presents with right foot wound infection. Pt state that he has right foot wound for more than 3 weeks, which has been progressively worsening, with foul smell. His right foot is painful, which is constant, sharp, moderate to severe, nonradiating.Patient was seen by vascular surgeon, Dr. Vonna Guardian on 5/13 and had a negative DVT study.  Dr. Vonna Guardian recommended an angiography with possible revascularization.  Vascular Surgery consulted to evaluate.  Past Medical History:  Diagnosis Date   Diabetes mellitus without complication (HCC)    Hypercholesteremia    Hypertension    Pancreatitis     Past Surgical History:  Procedure Laterality Date   ANKLE SURGERY Left 2001,2003   BACK SURGERY  [phone]    Allergies  Allergen Reactions   Morphine Other (See Comments)    headache     A HEADACHE AND SWEATING AND LETHARGIC headache  headache, ,  A HEADACHE AND SWEATING AND LETHARGIC headache   Morphine And Codeine Other (See Comments)    headache   Other Other (See Comments)    A HEADACHE AND SWEATING AND LETHARGIC    headache  A HEADACHE AND SWEATING AND LETHARGIC, , headache    Prior to Admission medications   Medication Sig Start Date End Date Taking? Authorizing Provider  acetaminophen  (TYLENOL ) 500 MG tablet Take 1,000 mg by mouth every 6 (six) hours as needed for mild pain or moderate pain.   Yes [provider]  atorvastatin  (LIPITOR) 20 MG tablet Take 1 tablet (20 mg total) by mouth daily. 06/02/21  Yes Luna Salinas, MD  busPIRone  (BUSPAR ) 15  MG tablet Take 15 mg by mouth 3 (three) times daily.   Yes [provider]  cetirizine (ZYRTEC) 10 MG tablet Take 10 mg by mouth daily.   Yes [provider]  cloNIDine  (CATAPRES ) 0.2 MG tablet Take 0.2 mg by mouth 3 (three) times daily.   Yes [provider]  DULoxetine  (CYMBALTA ) 60 MG capsule Take 60 mg by mouth daily.   Yes [provider]  HUMALOG  100 UNIT/ML injection Inject 25 Units into the skin 2 (two) times daily.   Yes [provider]  LANTUS  100 UNIT/ML injection SMARTSIG:100 Unit(s) SUB-Q Twice Daily   Yes [provider]  losartan  (COZAAR ) 100 MG tablet Take 100 mg by mouth. 04/23/22  Yes [provider]  nitroGLYCERIN (NITROSTAT) 0.4 MG SL tablet Place under the tongue. 10/02/23  Yes [provider]  pregabalin  (LYRICA ) 25 MG capsule Take 1 capsule (25 mg total) by mouth 3 (three) times daily. 07/05/21 10/19/24 Yes Heather Litter, MD  rOPINIRole (REQUIP) 1 MG tablet Take 1 mg by mouth at bedtime. 04/11/22  Yes [provider]  gemfibrozil  (LOPID ) 600 MG tablet Take 1 tablet (600 mg total) by mouth 2 (two) times daily before a meal. Patient not taking: Reported on 10/27/2023 01/14/17   Marquita Situ, MD  insulin  NPH Human (NOVOLIN N) 100 UNIT/ML injection Inject 0-50 Units into the skin 2 (two) times daily. Patient not taking: Reported on 10/27/2023    [provider]  insulin   regular (NOVOLIN R) 100 units/mL injection Inject into the skin See admin instructions. Inject twice daily according to blood glucose reading Patient not taking: Reported on 10/27/2023    [provider]  losartan -hydrochlorothiazide  (HYZAAR) 100-25 MG tablet Take 1 tablet by mouth daily. Patient not taking: Reported on 10/27/2023    [provider]  Multiple Vitamin (MULTIVITAMIN WITH MINERALS) TABS tablet Take 1 tablet by mouth daily. Patient not taking: Reported on 10/20/2023 06/02/21   Amin,  Sumayya, MD  naproxen sodium (ALEVE) 220 MG tablet Take 440 mg by mouth 2 (two) times daily as needed (pain). Patient not taking: Reported on 10/20/2023    [provider]  polyethylene glycol (MIRALAX  / GLYCOLAX ) packet Take 17 g by mouth daily. Patient not taking: Reported on 10/20/2023 01/15/17   Marquita Situ, MD  torsemide  (DEMADEX ) 20 MG tablet Take 1 tablet (20 mg total) by mouth daily. Patient not taking: Reported on 10/20/2023 06/01/21   Luna Salinas, MD    Social History   Socioeconomic History   Marital status: Married    Spouse name: Not on file   Number of children: Not on file   Years of education: Not on file   Highest education level: Not on file  Occupational History   Not on file  Tobacco Use   Smoking status: Former    Types: Cigarettes   Smokeless tobacco: Current    Types: Chew  Vaping Use   Vaping status: Never Used  Substance and Sexual Activity   Alcohol use: No    Alcohol/week: 0.0 standard drinks of alcohol   Drug use: No   Sexual activity: Yes    Partners: Male    Birth control/protection: None  Other Topics Concern   Not on file  Social History Narrative   Not on file   Social Drivers of Health   Financial Resource Strain: Not on file  Food Insecurity: Not on file  Transportation Needs: Not on file  Physical Activity: Not on file  Stress: Not on file  Social Connections: Unknown (12/05/2021)   Received from [facility]   Social Network    Social Network: Not on file  Intimate Partner Violence: Unknown (12/05/2021)   Received from [facility], [facility]   HITS    Physically Hurt: Not on file    Insult or Talk Down To: Not on file    Threaten Physical Harm: Not on file    Scream or Curse: Not on file     Family History  Problem Relation Age of Onset   Heart disease Mother    Diabetes Mother    Diabetes Sister     ROS: Otherwise negative unless mentioned in HPI  Physical  Examination  Vitals:   10/27/23 0318 10/27/23 0545  BP:  (!) 116/59  Pulse:  (!) 105  Resp:  (!) 27  Temp:  99.7 F (37.6 C)  SpO2: (!) 89% 100%   Body mass index is 47.43 kg/m.  General:  WDWN in NAD Gait: Not observed HENT: WNL, normocephalic Pulmonary: normal non-labored breathing, without Rales, rhonchi,  wheezing Cardiac: regular, without  Murmurs, rubs or gallops; without carotid bruits Abdomen: Positive bowel sounds throughout, soft, NT/ND, no masses Skin: without rashes Vascular Exam/Pulses: Right lower extremity with vascular insufficiency and gangrene with open wound of right great toe. Unable to palpate pulses.  Extremities: with ischemic changes, with Gangrene , with cellulitis; with open wounds;  Musculoskeletal: no muscle wasting or atrophy  Neurologic: A&O  X 3;  No focal weakness or paresthesias are detected; speech is fluent/normal Psychiatric:  The pt has Normal affect. Lymph:  Unremarkable  CBC    Component Value Date/Time   WBC 15.6 (H) 10/27/2023 0459   RBC 3.79 (L) 10/27/2023 0459   HGB 11.1 (L) 10/27/2023 0459   HGB 13.4 03/15/2014 0339   HCT 37.4 (L) 10/27/2023 0459   HCT 40.4 03/15/2014 0339   PLT 298 10/27/2023 0459   PLT 173 03/15/2014 0339   MCV 98.7 10/27/2023 0459   MCV 94 03/15/2014 0339   MCH 29.3 10/27/2023 0459   MCHC 29.7 (L) 10/27/2023 0459   RDW 15.9 (H) 10/27/2023 0459   RDW 13.1 03/15/2014 0339   LYMPHSABS 1.4 10/26/2023 1817   LYMPHSABS 2.2 03/12/2014 0600   MONOABS 0.9 10/26/2023 1817   MONOABS 1.3 (H) 03/12/2014 0600   EOSABS 0.1 10/26/2023 1817   EOSABS 0.0 03/12/2014 0600   BASOSABS 0.0 10/26/2023 1817   BASOSABS 0.1 03/12/2014 0600    BMET    Component Value Date/Time   NA 132 (L) 10/27/2023 0459   NA 135 (L) 03/15/2014 0339   K 4.9 10/27/2023 0459   K 3.1 (L) 03/15/2014 0339   CL 97 (L) 10/27/2023 0459   CL 104 03/15/2014 0339   CO2 28 10/27/2023 0459   CO2 24 03/15/2014 0339   GLUCOSE 134 (H) 10/27/2023  0459   GLUCOSE 127 (H) 03/15/2014 0339   BUN 34 (H) 10/27/2023 0459   BUN 9 03/15/2014 0339   CREATININE 1.30 (H) 10/27/2023 0459   CREATININE 0.64 03/15/2014 0339   CALCIUM  8.0 (L) 10/27/2023 0459   CALCIUM  8.4 (L) 03/15/2014 0339   GFRNONAA >60 10/27/2023 0459   GFRNONAA >60 03/15/2014 0339   GFRAA >60 02/28/2018 0429   GFRAA >60 03/15/2014 0339    COAGS: Lab Results  Component Value Date   INR 1.2 10/26/2023   INR 1.1 07/04/2021   INR 1.02 10/12/2009     Non-Invasive Vascular Imaging:   X-ray right foot: 1. Large soft tissue ulceration of the distal first toe with marked erosive changes of the first distal phalanx extending to the interphalangeal joint space. 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 2. Soft tissue gas surrounding the first proximal phalanx, concerning for infection with gas-forming organism.  Statin:  Yes.   Beta Blocker:  No. Aspirin :  No. ACEI:  No. ARB:  Yes.   CCB use:  No Other antiplatelets/anticoagulants:  No.    ASSESSMENT/PLAN: This is a 53 y.o. male who presents to [facility] emergency department with right foot wound infection.  X-ray of his foot shows a large soft tissue ulceration of the distal left first toe with marked erosive changes of the first distal phalanx extending to the interphalangeal joint space.  These findings are consistent with osteomyelitis/septic Ortho.  There is also noted soft tissue gas surrounding the proximal phalanx which is concerning for gas-forming infection.  Therefore vascular surgery recommends patient undergo a right lower extremity above-the-knee amputation.  Vascular surgery plans on taking the patient to the operating room tomorrow 10/28/2023 for right above-the-knee amputation.  I discussed in detail at the bedside separately with the patient the procedure, benefits, risk, and complications.  Patient verbalizes understanding and wishes to proceed.  I answered all the patient's questions.   Patient was made n.p.o. after midnight tonight for surgery tomorrow.   -I discussed the case in detail with Dr. Devon Fogo MD and he agrees with the plan.   Cooper Denver  R [facility] 10/27/2023 7:19 AM

## 2023-10-27 NOTE — Inpatient Diabetes Management (Signed)
 Inpatient Diabetes Program Recommendations  AACE/ADA: New Consensus Statement on Inpatient Glycemic Control  Target Ranges:  Prepandial:   less than 140 mg/dL      Peak postprandial:   less than 180 mg/dL (1-2 hours)      Critically ill patients:  140 - 180 mg/dL    Latest Reference Range & Units 10/26/23 22:57 10/26/23 22:59 10/26/23 23:24 10/27/23 00:20 10/27/23 01:26  Glucose-Capillary 70 - 99 mg/dL 25 (LL) 22 (LL) 43 (LL) 168 (H) 214 (H)    Latest Reference Range & Units 10/26/23 18:17 10/27/23 04:59  Glucose 70 - 99 mg/dL 098 (H) 119 (H)   Review of Glycemic Control  Diabetes history: DM2 Outpatient Diabetes medications: Lantus  100 units BID, Humalog  25 units BID Current orders for Inpatient glycemic control: Novolog  0-9 units TID with meals, Novolog  0-5 units QHS  Inpatient Diabetes Program Recommendations:    Insulin : No insulin  given since arrival. Initial lab glucose 159 mg/dl at 14:78 on 2/95 and CBG 25 mg/dl at 62:13 which was treated per protocol.  If CBGs become consistently over 180 mg/dl with Novolog  correction, may need to add basal insulin .  HbgA1C: Please consider ordering an A1C to evaluate glycemic control over the past 2-3 months.  NOTE: Patient use to see Dr. Lorelei Rogers (Endocrinologist) and was last seen 05/24/2018. Not able to see any PCP notes in Epic or Care Everywhere. Patient came in to ED on 10/26/23 with right foot wound infection and being admitted with sepsis due to foot infection and osteomyelitis.   Thanks, Beacher Limerick, RN, MSN, CDCES Diabetes Coordinator Inpatient Diabetes Program [phone] (Team Pager from 8am to 5pm)

## 2023-10-27 NOTE — ED Notes (Signed)
 Pt pulled out LAC IV, accidentally. IV team consult in.

## 2023-10-27 NOTE — ED Notes (Addendum)
 RN took over care of pt at this time. Pt initially reporting to ED d/t wounds on bilateral lower extremeties, pt was found to have maggots falling from RLE wound when the previous RN unwrapped the bandaging. Pt wound was rewrapped prior to pt transportation to this room. Pt sitting up on side of bed, labored breathing. Pt on [place] @ 5 lpm with fluctuating O2 saturation. RN will monitor for now. Pt awaiting inpatient room and plan for surgery.   Past Medical History:  Diagnosis Date   Diabetes mellitus without complication (HCC)    Hypercholesteremia    Hypertension    Pancreatitis

## 2023-10-27 NOTE — Consult Note (Signed)
 Consultation Note Date: 10/27/2023   Patient Name: Martin Riddle  DOB: 02-Aug-1970  MRN: [id number]  Age / Sex: 53 y.o., male  PCP: Thomasene Flemings, NP Referring Physician: Althia Atlas, MD  Reason for Consultation: Establishing goals of care  HPI/Patient Profile: PER H&P: "Martin Riddle is a 53 y.o. male with medical history significant of DM, atherosclerosis of native arteries of the extremities with ulceration, diabetic foot ulcer, HTN, HLD, depression, chronic venous stasis in both legs, pancreatitis, peripheral neuropathy, morbid obesity with BMI of 47, who presents with right foot wound infection.   Pt state that he has right foot wound for more than 3 weeks, which has been progressively worsening, with foul smell. His right foot is painful, which is constant, sharp, moderate to severe, nonradiating.  Denies fever or chills.  Patient had chest discomfort earlier, which has resolved.  Currently no leg chest pain, cough, SOB.  No nausea, vomiting, diarrhea or abdominal pain.  No symptoms of UTI. Of note, patient was seen by vascular surgeon, Dr. Vonna Guardian on 5/13 and had a negative DVT study.  Dr. Vonna Guardian recommended an angiography with possible revascularization".    Clinical Assessment and Goals of Care: Notes and labs reviewed.  Updates provided by CCM.  In to see patient.  He is currently sitting in bed at this time, no family at bedside.  Patient discusses that he lives at home with his wife.  He states he has a son.   We discussed his diagnoses, prognosis, GOC, EOL wishes disposition and options.  Created space and opportunity for patient  to explore thoughts and feelings regarding current medical information.   A detailed discussion was had today regarding advanced directives.  Concepts specific to code status, artifical feeding and hydration, IV antibiotics and rehospitalization were discussed.  The  difference between an aggressive medical intervention path and a comfort care path was discussed.  Values and goals of care important to patient and family were attempted to be elicited.  Discussed limitations of medical interventions to prolong quality of life in some situations and discussed the concept of human mortality.  He states his mother had vascular issues at baseline and had several amputations.  He stated that his mother finally decided not to continue with amputations and died.  He states had she kept going, she may still be alive.  He states he also understands that she was concerned with quality of life.   He states he is concerned about his future.  States he is a man of deep faith and feels that his time may be limited, but is not settled with the plan of comfort care.  He states he does not believe that he would want to have CPR, and would not want to live in a vegetative state, but may want to treat the treatable including amputation if needed.  He states he did not realize how sick he was.  He states he will need to give this further thought and speak with vascular surgery regarding recommendations.  Patient dozed numerous times during our meeting.  He states he has baseline sleep apnea and states he has not had a good night sleep in a very long time.  He states his wife would be his surrogate decision maker if he is unable to make decisions for himself.  He requests that conversations remain between us  unless he is not able to make decisions for himself.  SUMMARY OF RECOMMENDATIONS   PMT will follow         Primary Diagnoses: Present on Admission:  Diabetic foot infection (HCC)  Atherosclerosis of native arteries of the extremities with ulceration (HCC)  HTN (hypertension)  HLD (hyperlipidemia)  Type 2 diabetes mellitus with peripheral neuropathy (HCC)  Obesity, Class III, BMI 40-49.9 (morbid obesity)  Severe sepsis (HCC)  Depression  Osteomyelitis of right foot  (HCC)  Hypoglycemia   I have reviewed the medical record, interviewed the patient and family, and examined the patient. The following aspects are pertinent.  Past Medical History:  Diagnosis Date   Diabetes mellitus without complication (HCC)    Hypercholesteremia    Hypertension    Pancreatitis    Social History   Socioeconomic History   Marital status: Married    Spouse name: Not on file   Number of children: Not on file   Years of education: Not on file   Highest education level: Not on file  Occupational History   Not on file  Tobacco Use   Smoking status: Former    Types: Cigarettes   Smokeless tobacco: Current    Types: Chew  Vaping Use   Vaping status: Never Used  Substance and Sexual Activity   Alcohol use: No    Alcohol/week: 0.0 standard drinks of alcohol   Drug use: No   Sexual activity: Yes    Partners: Male    Birth control/protection: None  Other Topics Concern   Not on file  Social History Narrative   Not on file   Social Drivers of Health   Financial Resource Strain: Not on file  Food Insecurity: Patient Declined (10/27/2023)   Hunger Vital Sign    Worried About Running Out of Food in the Last Year: Patient declined    Ran Out of Food in the Last Year: Patient declined  Transportation Needs: Patient Declined (10/27/2023)   PRAPARE - Administrator, Civil Service (Medical): Patient declined    Lack of Transportation (Non-Medical): Patient declined  Physical Activity: Not on file  Stress: Not on file  Social Connections: Patient Declined (10/27/2023)   Social Connection and Isolation Panel [NHANES]    Frequency of Communication with Friends and Family: Patient declined    Frequency of Social Gatherings with Friends and Family: Patient declined    Attends Religious Services: Patient declined    Database administrator or Organizations: Patient declined    Attends Engineer, structural: Patient declined    Marital Status:  Patient declined   Family History  Problem Relation Age of Onset   Heart disease Mother    Diabetes Mother    Diabetes Sister    Scheduled Meds:  arformoterol  15 mcg Nebulization BID   [START ON 10/29/2023] ascorbic acid  500 mg Oral Daily   aspirin  EC  81 mg Oral Daily   atorvastatin   20 mg Oral Daily   budesonide  (PULMICORT ) nebulizer solution  0.25 mg Nebulization BID   busPIRone   15 mg Oral TID   Chlorhexidine  Gluconate Cloth  6 each Topical Daily  cloNIDine   0.2 mg Oral TID   cyanocobalamin  1,000 mcg Intramuscular Q1200   Followed by   Cecily Cohen ON 11/03/2023] vitamin B-12  1,000 mcg Oral Daily   DULoxetine   20 mg Oral QHS   DULoxetine   60 mg Oral Daily   furosemide   40 mg Intravenous BID   heparin   5,000 Units Subcutaneous Q8H   [START ON 10/29/2023] iron polysaccharides  150 mg Oral Daily   methylPREDNISolone  (SOLU-MEDROL ) injection  125 mg Intravenous Once   mouth rinse  15 mL Mouth Rinse 4 times per day   pregabalin   25 mg Oral TID   Continuous Infusions:  dextrose  75 mL/hr at 10/27/23 1256   piperacillin -tazobactam (ZOSYN )  IV Stopped (10/27/23 0616)   vancomycin      PRN Meds:.acetaminophen , dextrose , haloperidol  lactate, hydrALAZINE , HYDROmorphone  (DILAUDID ) injection, ipratropium-albuterol , LORazepam , nitroGLYCERIN, ondansetron  (ZOFRAN ) IV, mouth rinse, oxyCODONE -acetaminophen  Medications Prior to Admission:  Prior to Admission medications   Medication Sig Start Date End Date Taking? Authorizing Provider  acetaminophen  (TYLENOL ) 500 MG tablet Take 1,000 mg by mouth every 6 (six) hours as needed for mild pain or moderate pain.   Yes [provider]  atorvastatin  (LIPITOR) 20 MG tablet Take 1 tablet (20 mg total) by mouth daily. 06/02/21  Yes Luna Salinas, MD  busPIRone  (BUSPAR ) 15 MG tablet Take 15 mg by mouth 3 (three) times daily.   Yes [provider]  cetirizine (ZYRTEC) 10 MG tablet Take 10 mg by mouth daily.   Yes [provider]   cloNIDine  (CATAPRES ) 0.2 MG tablet Take 0.2 mg by mouth 3 (three) times daily.   Yes [provider]  DULoxetine  (CYMBALTA ) 60 MG capsule Take 60 mg by mouth daily.   Yes [provider]  HUMALOG  100 UNIT/ML injection Inject 25 Units into the skin 2 (two) times daily.   Yes [provider]  LANTUS  100 UNIT/ML injection SMARTSIG:100 Unit(s) SUB-Q Twice Daily   Yes [provider]  losartan  (COZAAR ) 100 MG tablet Take 100 mg by mouth. 04/23/22  Yes [provider]  nitroGLYCERIN (NITROSTAT) 0.4 MG SL tablet Place under the tongue. 10/02/23  Yes [provider]  pregabalin  (LYRICA ) 25 MG capsule Take 1 capsule (25 mg total) by mouth 3 (three) times daily. 07/05/21 10/19/24 Yes Heather Litter, MD  rOPINIRole (REQUIP) 1 MG tablet Take 1 mg by mouth at bedtime. 04/11/22  Yes [provider]  gemfibrozil  (LOPID ) 600 MG tablet Take 1 tablet (600 mg total) by mouth 2 (two) times daily before a meal. Patient not taking: Reported on 10/27/2023 01/14/17   Marquita Situ, MD  insulin  NPH Human (NOVOLIN N) 100 UNIT/ML injection Inject 0-50 Units into the skin 2 (two) times daily. Patient not taking: Reported on 10/27/2023    [provider]  insulin  regular (NOVOLIN R) 100 units/mL injection Inject into the skin See admin instructions. Inject twice daily according to blood glucose reading Patient not taking: Reported on 10/27/2023    [provider]  losartan -hydrochlorothiazide  (HYZAAR) 100-25 MG tablet Take 1 tablet by mouth daily. Patient not taking: Reported on 10/27/2023    [provider]  Multiple Vitamin (MULTIVITAMIN WITH MINERALS) TABS tablet Take 1 tablet by mouth daily. Patient not taking: Reported on 10/20/2023 06/02/21   Amin, Sumayya, MD  naproxen sodium (ALEVE) 220 MG tablet Take 440 mg by mouth 2 (two) times daily as needed (pain). Patient not taking: Reported on 10/20/2023    [provider]  polyethylene glycol (MIRALAX  /  GLYCOLAX ) packet Take 17 g by mouth daily. Patient not taking: Reported on 10/20/2023 01/15/17   Marquita Situ, MD  torsemide  (DEMADEX ) 20 MG tablet Take 1 tablet (20 mg total) by mouth daily. Patient not taking: Reported on 10/20/2023 06/01/21   Luna Salinas, MD   Allergies  Allergen Reactions   Morphine Other (See Comments)    headache     A HEADACHE AND SWEATING AND LETHARGIC headache  headache, ,  A HEADACHE AND SWEATING AND LETHARGIC headache   Morphine And Codeine Other (See Comments)    headache   Other Other (See Comments)    A HEADACHE AND SWEATING AND LETHARGIC    headache  A HEADACHE AND SWEATING AND LETHARGIC, , headache   Review of Systems  Musculoskeletal:        Right foot and leg pain    Physical Exam Pulmonary:     Effort: Pulmonary effort is normal.  Skin:    General: Skin is warm and dry.  Neurological:     Mental Status: He is alert.     Vital Signs: BP 121/65   Pulse 81   Temp 98.3 F (36.8 C) (Oral)   Resp (!) 21   Ht 6\' 5"  (1.956 m)   Wt (!) 150.2 kg   SpO2 92%   BMI 39.27 kg/m  Pain Scale: 0-10   Pain Score: 8    SpO2: SpO2: 92 % O2 Device:SpO2: 92 % O2 Flow Rate: .O2 Flow Rate (L/Riddle): 6 L/Riddle  IO: Intake/output summary:  Intake/Output Summary (Last 24 hours) at 10/27/2023 1531 Last data filed at 10/27/2023 1330 Gross per 24 hour  Intake 240 ml  Output --  Net 240 ml    LBM: Last BM Date :  (unsure) Baseline Weight: Weight: (!) 181.4 kg Most recent weight: Weight: (!) 150.2 kg        Signed by: Meribeth Standard, NP   Please contact Palliative Medicine Team phone at [phone] for questions and concerns.  For individual provider: See Tilford Foley

## 2023-10-27 NOTE — Consult Note (Signed)
 PODIATRY CONSULTATION  NAME Martin Riddle MRN [id number] DOB 04-Oct-1970 DOA 10/26/2023   Reason for consult:  Gangrene of right foot  Attending/Consulting physician: Judah North MD  History of present illness:  "Martin Riddle is a 53 y.o. male with medical history significant of DM, atherosclerosis of native arteries of the extremities with ulceration, diabetic foot ulcer, HTN, HLD, depression, chronic venous stasis in both legs, pancreatitis, peripheral neuropathy, morbid obesity with BMI of 47, who presents with right foot wound infection.   Pt state that he has right foot wound for more than 3 weeks, which has been progressively worsening, with foul smell. His right foot is painful, which is constant, sharp, moderate to severe, nonradiating.  Denies fever or chills.  Patient had chest discomfort earlier, which has resolved.  Currently no leg chest pain, cough, SOB.  No nausea, vomiting, diarrhea or abdominal pain.  No symptoms of UTI. Of note, patient was seen by vascular surgeon, Dr. Vonna Guardian on 5/13 and had a negative DVT study.  Dr. Vonna Guardian recommended an angiography with possible revascularization."  Little to no history available to be gained from the patient.  He says he has had the wound for a while.  Unclear who was taking care of the wound at home or if nobody was. he is aware he is at risk of losing his leg.  He does not seem to care.  He does report he does not want partial foot amputation or amputation of his leg today.  He states he wants to leave.    Past Medical History:  Diagnosis Date   Diabetes mellitus without complication (HCC)    Hypercholesteremia    Hypertension    Pancreatitis        Latest Ref Rng & Units 10/27/2023    4:59 AM 10/26/2023    6:17 PM 07/07/2021    7:04 PM  CBC  WBC 4.0 - 10.5 K/uL 15.6  11.3  6.9   Hemoglobin 13.0 - 17.0 g/dL 95.6  21.3  08.6   Hematocrit 39.0 - 52.0 % 37.4  36.2  43.1   Platelets 150 - 400 K/uL 298  238  185        Latest  Ref Rng & Units 10/27/2023    4:59 AM 10/26/2023    6:17 PM 07/07/2021    7:04 PM  BMP  Glucose 70 - 99 mg/dL 578  469  629   BUN 6 - 20 mg/dL 34  32  21   Creatinine 0.61 - 1.24 mg/dL 5.28  4.13  2.44   Sodium 135 - 145 mmol/L 132  131  135   Potassium 3.5 - 5.1 mmol/L 4.9  4.0  4.2   Chloride 98 - 111 mmol/L 97  94  95   CO2 22 - 32 mmol/L 28  24  31    Calcium  8.9 - 10.3 mg/dL 8.0  8.3  01.0       Physical Exam: Lower Extremity Exam Right foot with severe necrotic gangrenous changes to the first ray with erythema extending to the mid calf.  There is erosion of the distal aspect of the hallux with near complete splitting of the soft tissues due to bone loss.  There are maggots present in the first ray wound site.  There is severe maceration and severe malodor of the area.  Nonpalpable DP and PT pulses on the right foot.    Negative sensation to light touch.    Infection tracking to at least  the midfoot joints if not proximal proximal at this point         Left foot with necrotic changes on the nailbed of multiple toes Scattered wounds present dorsally on the toes. No significant erythema or draining infection noted Nonpalpable DP and PT pulses   ASSESSMENT/PLAN OF CARE 53 y.o. 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   Tmax 100.1 F, tachypneic and tachycardic WBC 15.6 A1c pending CRP 13 ESR 95  XR foot right 2 views: Erosion of the distal phalanx concerning for acute on chronic osteomyelitis as well as osteomyelitis of the proximal phalanx and soft tissue emphysema about the first MPJ concerning for gas gangrene   -Unfortunately due to the  severity of this patient's infection I recommend proximal limb amputation for source control.  Do not believe he would be a good candidate for transmetatarsal amputation given his overall medical conditions and noncompliance as documented in the ED.  TMA would have a high risk of failure needing proximal limb amputation in the near future therefore I believe it is ultimately futile and for true source control would recommend proximal limb amputation. -If patient refuses amputation for source control including TMA or BKA as he is today and chooses to leave AMA he is at high risk of worsening sepsis and death, this has been explained to him. - Continue IV abx broad spectrum pending further culture data - Anticoagulation: Per primary - Wound care: Betadine wet-to-dry dressings until amputation is completed  - WB status: Nonweightbearing to right foot patient is noncompliant with this however is repeatedly getting up in the room  - Additionally the patient is at risk for worsening wounds/ infection on the left lower extremity, though currently does not appear to be acutely infected.  Unclear what the vascular status is on left lower extremity recommend further evaluation by the vascular team if aggressive care is pursued.   Thank you for the consult.  Please contact me directly with any questions or concerns.           Maridee Shoemaker, DPM [facility]    [address] [place], [place] [address]                Office [phone]  Fax [phone]

## 2023-10-27 NOTE — Progress Notes (Signed)
   10/27/23 1330  Spiritual Encounters  Type of Visit Initial  Care provided to: Patient  Conversation partners present during encounter Nurse  Referral source Physician  Reason for visit Routine spiritual support  OnCall Visit Yes  Spiritual Framework  Presenting Themes Goals in life/care;Other (comment) (Palliative Dr called in to help Pt make a decision about his care (He keeps changing his mind))  Interventions  Spiritual Care Interventions Made Established relationship of care and support;Compassionate presence  Intervention Outcomes  Outcomes Connection to spiritual care

## 2023-10-27 NOTE — ED Notes (Signed)
 Patient placed back on O2 at this time.

## 2023-10-27 NOTE — Progress Notes (Signed)
 ABG results did not transmit to [facility] by previous therapist.   ABG @ 1840  pH 7.22 pCo2 88 pO2 45 HCO3 36.0

## 2023-10-27 NOTE — ED Notes (Signed)
 Patient found standing on side of bed w/o O2 on in soiled clothing demanding to leave. Pt redirected to sit on bed by RN and O2 placed back on/cleaned up.

## 2023-10-27 NOTE — Progress Notes (Signed)
 Pt transported from ER 38 to ICU 9 while on bipap with no complications

## 2023-10-27 NOTE — Progress Notes (Signed)
   10/27/23 1345  Spiritual Encounters  Type of Visit Follow up  Care provided to: Family (Wife in waiting room while palliative NP w/Pt)  Referral source Nurse (RN/NT/LPN)  Reason for visit Grief/loss (Wife stated she is sad because Pt keeps refusing to keep appts and kept refusing to go to wound care appts.)  OnCall Visit Yes  Spiritual Framework  Presenting Themes Goals in life/care;Coping tools;Impactful experiences and emotions;Other (comment) (for Wife)  Interventions  Spiritual Care Interventions Made Established relationship of care and support;Compassionate presence;Reflective listening;Supported grief process;Other (comment) (for Wife)  Intervention Outcomes  Outcomes Connection to spiritual care;Awareness around self/spiritual resourses;Connection to values and goals of care;Other (comment) (for Wife)

## 2023-10-27 NOTE — ED Notes (Signed)
 Patient placed back on cardiac monitoring and O2 at this time.

## 2023-10-27 NOTE — ED Notes (Signed)
 MD informed that patient is demanding to go home. MD coming to bedside.

## 2023-10-27 NOTE — Progress Notes (Signed)
 No avatar upon my first assessment.

## 2023-10-27 NOTE — Consult Note (Signed)
 NAME:  Martin Riddle, MRN:  [id number], DOB:  29-Aug-1970, LOS: 1 ADMISSION DATE:  10/26/2023, CONSULTATION DATE: 10/27/2023 REFERRING MD: Dr. Hubert Madden, CHIEF COMPLAINT: Right Foot Wound   History of Present Illness:  This is a 53 yo male who presented to [facility] ER on 05/19 with a wound to his right foot resulting in worsening pain.  Per ER notes pts wife reported the wound initially started off as a blister that popped and has gradually worsened.  He was evaluated by vascular surgery in the outpatient setting on 05/13 with nonhealing ulcerations.  Vascular Surgery recommended angiography with possible revascularization during that office visit     ED Course Upon arrival to the ER significant lab results were: Na+ 131/chloride 94/glucose 159/BUN 32/albumin 3.0/lactic acid 2.3/wbc 11.3/hgb 10.9.  Right foot x-ray revealed erosion of the distal phalanx concerning for acute on chronic osteomyelitis as well as osteomyelitis of the proximal phalanx and soft tissue emphysema about the first MPJ concerning for gas gangrene.  Vital signs were stable.  Pt started on vancomycin  and zosyn .  Pt subsequently admitted to the medsurg unit per hospitalist team for additional workup and treatment.  However, he remained in the ER pending bed availability.  See detailed hospital course below under significant events.   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  He was  initially IVC'd by psychiatry due to concern of inability to make sound medical decisions for himself.  Pts mentation improved and he was transitioned to nasal canula with improvement in CBG's.  Psych rescinded the IVC and pt deemed competent to make medical decisions.  Unfortunately, later during the shift pt became unresponsive and hypoxic requiring mechanical intubation  Interim History / Subjective:  Pt sedated and mechanically intubated.    Objective    Blood pressure (!) 174/77, pulse (!) 112, temperature 98.3 F (36.8 C), temperature source Oral, resp. rate (!) 48, height 6\' 5"  (1.956 m), weight (!) 181.4 kg, SpO2 95%.       No intake or output data in the 24 hours ending 10/27/23 1024 Filed Weights   10/26/23 1804  Weight: (!) 181.4 kg    Examination: General: Acute on chronically-ill appearing male, NAD mechanically intubated  HENT: Difficult to assess for JVD due to body habitus, orally intubated   Lungs: Diminished throughout, even, non labored  Cardiovascular: Sinus rhythm with depressed T wave, no m/r/g, 2+ radial/1+ distal pulses via doppler, 2+ bilateral lower extremity edema  Abdomen: +BS x4, obese, soft, non distended  Extremities/Skin: see images below  Right foot   Left foot   Left foot   Neuro: Sedated/paralyzed, not following commands, PERRL  GU: Pending indwelling catheter placement   Resolved problem list   Assessment and Plan   #Acute toxic metabolic encephalopathy  #Mechanical intubation pain/discomfort  - Correct metabolic derangements  - Maintain RASS goal 0 to -1 - PAD protocol to maintain RASS goal: propofol  and fentanyl  gtts  - WUA daily  - Avoid sedating  medication as able   #Hypotension: sedation related and septic shock #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  Hx: Hypercholesteremia and HTN  - Continuous telemetry monitoring  - Continue outpatient aspirin  and atorvastatin   - Hold  outpatient antihypertensives and diuretics  - Prn levophed  gtt to maintain map 65 or higher  - Troponin pending   #Acute hypoxic hypercapnic respiratory failure suspect secondary to OHS due to morbid obesity  #Mechanical intubation  - Full vent support for now: vent settings reviewed and established  - Continue lung protective strategies  - Maintain plateau pressures less than 30 cm H2O - VAP bundle implemented  - IV and nebulized steroids  - Prn bronchodilator therapy  - Intermittent CXR and ABG's - SBT once all parameters met   #Acute kidney injury likely secondary to ATN #Hyponatremia  - Trend BMP and lactic acid  - Replace electrolytes as indicated  - Strict I&O's - Avoid nephrotoxic agents as able   #Sepsis secondary to severe right foot infection/osteomyelitis with possible gas gangrene of the first MPJ - Trend WBC and monitor fever curve  - Trend PCT - Follow cultures  - Podiatry consulted and felt due to severity of infection recommended proximal limb amputation for source control Vascular Surgery consulted for RLE amputation  - ID consulted appreciate input: abx per recommendations  #Iron deficiency anemia  - Trend CBC  - Monitor for s/sx of bleeding  - Transfuse for hgb <7 - Continue iron supplementation   #Hypoglycemia~improving  #Type II diabetes mellitus  Hemoglobin A1c 10/27/23: 8.9 - CBG's q1hr for now  - Follow hypo/hyperglycemic protocol  - Diabetes coordinator consulted appreciate input - Hold subcutaneous insulin  for now until CBG's are consistently over 180 mg/dl  - Target CBG range 284 to 180   Palliative care consulted to assist with goals of treatment.  Pt is High Risk For Cardiac Arrest and Sudden Death.  Recommend changing code status to DNR Best Practice (right click and "Reselect all SmartList Selections" daily)   Diet/type: NPO DVT prophylaxis prophylactic heparin   Pressure ulcer(s): N/A GI prophylaxis: H2B Lines: N/A Foley:  Yes, and it  is still needed Code Status:  full code Last date of multidisciplinary goals of care discussion [10/27/2023]  05/20: Attempted to contact pts wife, son, and sister via telephone to inform them pts condition has declined significantly pt currently mechanically intubated.  However, they did not answer the telephone.  Unable to leave a voicemail on pts spouses phone, therefore HIPAA appropriate voicemail left on pts son's phone Burnice Carton) instructing him to return my phone call. Labs   CBC: Recent Labs  Lab 10/26/23 1817 10/27/23 0459  WBC 11.3* 15.6*  NEUTROABS 8.7*  --   HGB 10.9* 11.1*  HCT 36.2* 37.4*  MCV 96.5 98.7  PLT 238 298    Basic Metabolic Panel: Recent Labs  Lab 10/26/23 1817 10/27/23 0459  NA 131* 132*  K 4.0 4.9  CL 94* 97*  CO2 24 28  GLUCOSE 159* 134*  BUN 32* 34*  CREATININE 1.20 1.30*  CALCIUM  8.3* 8.0*   GFR: Estimated Creatinine Clearance: 117.1 mL/min (A) (by C-G formula based on SCr of 1.3 mg/dL (H)). Recent Labs  Lab 10/26/23 1817 10/26/23 2040 10/26/23 2331 10/27/23 0146 10/27/23 0459  WBC 11.3*  --   --   --  15.6*  LATICACIDVEN 2.3* 1.6 1.8 1.0  --     Liver Function Tests: Recent Labs  Lab 10/26/23 1817  AST  21  ALT 18  ALKPHOS 63  BILITOT 0.7  PROT 7.7  ALBUMIN 3.0*   No results for input(s): "LIPASE", "AMYLASE" in the last 168 hours. No results for input(s): "AMMONIA" in the last 168 hours.  ABG    Component Value Date/Time   PHART 7.31 (L) 10/27/2023 0854   PCO2ART 58 (H) 10/27/2023 0854   PO2ART 56 (L) 10/27/2023 0854   HCO3 29.2 (H) 10/27/2023 0854   ACIDBASEDEF 8.7 (H) 02/19/2018 1533   O2SAT 88.7 10/27/2023 0854     Coagulation Profile: Recent Labs  Lab 10/26/23 2136  INR 1.2    Cardiac Enzymes: No results for input(s): "CKTOTAL", "CKMB", "CKMBINDEX", "TROPONINI" in the last 168 hours.  HbA1C: Hemoglobin A1C  Date/Time Value Ref Range Status  03/11/2014 06:20 AM 12.3 (H) 4.2 - 6.3 % Final     Comment:    The American Diabetes Association recommends that a primary goal of therapy should be <7% and that physicians should reevaluate the treatment regimen in patients with HbA1c values consistently >8%.    Hgb A1c MFr Bld  Date/Time Value Ref Range Status  05/31/2021 07:43 AM 6.3 (H) 4.8 - 5.6 % Final    Comment:    (NOTE)         Prediabetes: 5.7 - 6.4         Diabetes: >6.4         Glycemic control for adults with diabetes: <7.0   01/11/2017 06:35 AM 7.4 (H) 4.8 - 5.6 % Final    Comment:    (NOTE)         Pre-diabetes: 5.7 - 6.4         Diabetes: >6.4         Glycemic control for adults with diabetes: <7.0     CBG: Recent Labs  Lab 10/27/23 0020 10/27/23 0126 10/27/23 0936 10/27/23 0947 10/27/23 1015  GLUCAP 168* 214* 17* 21* 60*    Review of Systems:   Unable to assess pt mechanically intubated   Past Medical History:  He,  has a past medical history of Diabetes mellitus without complication (HCC), Hypercholesteremia, Hypertension, and Pancreatitis.   Surgical History:   Past Surgical History:  Procedure Laterality Date   ANKLE SURGERY Left 2001,2003   BACK SURGERY  [phone]     Social History:   reports that he has quit smoking. His smoking use included cigarettes. His smokeless tobacco use includes chew. He reports that he does not drink alcohol and does not use drugs.   Family History:  His family history includes Diabetes in his mother and sister; Heart disease in his mother.   Allergies Allergies  Allergen Reactions   Morphine Other (See Comments)    headache     A HEADACHE AND SWEATING AND LETHARGIC headache  headache, ,  A HEADACHE AND SWEATING AND LETHARGIC headache   Morphine And Codeine Other (See Comments)    headache   Other Other (See Comments)    A HEADACHE AND SWEATING AND LETHARGIC    headache  A HEADACHE AND SWEATING AND LETHARGIC, , headache     Home Medications  Prior to Admission medications   Medication Sig  Start Date End Date Taking? Authorizing Provider  acetaminophen  (TYLENOL ) 500 MG tablet Take 1,000 mg by mouth every 6 (six) hours as needed for mild pain or moderate pain.   Yes [provider]  atorvastatin  (LIPITOR) 20 MG tablet Take 1 tablet (20 mg total) by mouth daily. 06/02/21  Yes  Luna Salinas, MD  busPIRone  (BUSPAR ) 15 MG tablet Take 15 mg by mouth 3 (three) times daily.   Yes [provider]  cetirizine (ZYRTEC) 10 MG tablet Take 10 mg by mouth daily.   Yes [provider]  cloNIDine  (CATAPRES ) 0.2 MG tablet Take 0.2 mg by mouth 3 (three) times daily.   Yes [provider]  DULoxetine  (CYMBALTA ) 60 MG capsule Take 60 mg by mouth daily.   Yes [provider]  HUMALOG  100 UNIT/ML injection Inject 25 Units into the skin 2 (two) times daily.   Yes [provider]  LANTUS  100 UNIT/ML injection SMARTSIG:100 Unit(s) SUB-Q Twice Daily   Yes [provider]  losartan  (COZAAR ) 100 MG tablet Take 100 mg by mouth. 04/23/22  Yes [provider]  nitroGLYCERIN (NITROSTAT) 0.4 MG SL tablet Place under the tongue. 10/02/23  Yes [provider]  pregabalin  (LYRICA ) 25 MG capsule Take 1 capsule (25 mg total) by mouth 3 (three) times daily. 07/05/21 10/19/24 Yes Heather Litter, MD  rOPINIRole (REQUIP) 1 MG tablet Take 1 mg by mouth at bedtime. 04/11/22  Yes [provider]  gemfibrozil  (LOPID ) 600 MG tablet Take 1 tablet (600 mg total) by mouth 2 (two) times daily before a meal. Patient not taking: Reported on 10/27/2023 01/14/17   Marquita Situ, MD  insulin  NPH Human (NOVOLIN N) 100 UNIT/ML injection Inject 0-50 Units into the skin 2 (two) times daily. Patient not taking: Reported on 10/27/2023    [provider]  insulin  regular (NOVOLIN R) 100 units/mL injection Inject into the skin See admin instructions. Inject twice daily according to blood glucose reading Patient not taking: Reported on  10/27/2023    [provider]  losartan -hydrochlorothiazide  (HYZAAR) 100-25 MG tablet Take 1 tablet by mouth daily. Patient not taking: Reported on 10/27/2023    [provider]  Multiple Vitamin (MULTIVITAMIN WITH MINERALS) TABS tablet Take 1 tablet by mouth daily. Patient not taking: Reported on 10/20/2023 06/02/21   Amin, Sumayya, MD  naproxen sodium (ALEVE) 220 MG tablet Take 440 mg by mouth 2 (two) times daily as needed (pain). Patient not taking: Reported on 10/20/2023    [provider]  polyethylene glycol (MIRALAX  / GLYCOLAX ) packet Take 17 g by mouth daily. Patient not taking: Reported on 10/20/2023 01/15/17   Marquita Situ, MD  torsemide  (DEMADEX ) 20 MG tablet Take 1 tablet (20 mg total) by mouth daily. Patient not taking: Reported on 10/20/2023 06/01/21   Luna Salinas, MD     Critical care time: 60 minutes      Janey Meek, [facility]  Pulmonary/Critical Care Pager [phone] (please enter 7 digits) PCCM Consult Pager [phone] (please enter 7 digits)

## 2023-10-27 NOTE — Progress Notes (Signed)
 1300- Patient A/Ox4. Patient stated it is okay to provide care. Per patient "he is ready to go if it is his time". Patient states he understands that he may die if he doesn't get surgery. Patient states he does want CPR / Intubation if neccessary. " Would like for us  to try to save him."  Palliative consult placed.  1400- Patient recalls his mother's demise. Patients mother with similar demise per patient. Patient agrees to surgery.  1600- Per patient and spouse. Patient takes his insulin , without checking sugars. Typically takes 1-2 100 unit syringe full of his reg or mixed insulin . Per patient he took 1 syringe full of his regular insulin  last night prior to coming to hospital. This is the way he manages his sugars.... MD notified. Requested DC consult.

## 2023-10-27 NOTE — ED Notes (Signed)
 Patient refusing Haldol  at this time.

## 2023-10-27 NOTE — Progress Notes (Signed)
   10/27/23 1200  Spiritual Encounters  Type of Visit Initial  Care provided to: Pt not available  Conversation partners present during encounter Nurse  Reason for visit Code  OnCall Visit No   Unit Chaplain repsonded to RRT.  Staff was working to get the patient stable.  Chaplain inquired and no family was present but staff shared patient's wife would likely be coming to the hospital shortly.  Staff shared they'd call the Chaplain phone when wife arrives so Chaplain can check in on the patient and wife at that time and provide spiritual care.    Rev. Rana M. Nolon Baxter, M.Div.  Chaplain Resident [facility]

## 2023-10-27 NOTE — ED Notes (Signed)
 Patient refusing to wear cardiac leads and oxygen at this time.

## 2023-10-27 NOTE — BH Assessment (Signed)
 IVC  RESCINDED  INFORMED  RN  [employer]  PT  NOW  VOLUNTARY

## 2023-10-27 NOTE — ED Notes (Signed)
 Patient desaturation noted on the monitor, RN found pt w/ O2 on, pt removed his IV dressing. Pt educated that he will decompensate if he continues to refuse O2

## 2023-10-27 NOTE — ED Notes (Signed)
 RN went into room after call from central tele that the pts O2 had been disconnected. RN saw that the pts R PIV had been yanked out as the pt was standing up, but did not have enough line to reach. Pt had a small amount of blood on the floor and bleeding was controlled from the IV site.

## 2023-10-27 NOTE — ED Notes (Signed)
 Patient placed back on full monitoring after ripping his leads off.

## 2023-10-27 NOTE — Progress Notes (Signed)
 1645  Patient encouraged to  put bipap back on due to increased drowsy appearance. Sats stable. RR stable. Patient refused bipap at this time. A/Ox4, BG stable   1715 Patient increasing drowsy with desaturation. Placed back on bipap. MD notified. Requested ABG.   1730 Patient decompensated with drop in sats, patient became minimally arousable, and tachypneic. 100 % O2 delivered. Sats improved. MD and RT called to bedside. Patient emergently intubated. Patient tolerated well.

## 2023-10-27 NOTE — Plan of Care (Signed)
  Problem: Education: Goal: Ability to describe self-care measures that may prevent or decrease complications (Diabetes Survival Skills Education) will improve Outcome: Not Progressing   Problem: Coping: Goal: Ability to adjust to condition or change in health will improve Outcome: Not Progressing   Problem: Fluid Volume: Goal: Ability to maintain a balanced intake and output will improve Outcome: Progressing   Problem: Health Behavior/Discharge Planning: Goal: Ability to identify and utilize available resources and services will improve Outcome: Not Progressing   Problem: Metabolic: Goal: Ability to maintain appropriate glucose levels will improve Outcome: Not Progressing

## 2023-10-27 NOTE — ED Notes (Signed)
 Patient stating that he wants to go home. Pt educated of the risks of leaving. MD notified.

## 2023-10-27 NOTE — ED Notes (Signed)
 RT called and made aware of RN placing pt on High Flow Nasal Cannula for low O2 on 6 lpm via [place].

## 2023-10-27 NOTE — ED Notes (Signed)
 MD paged for hypoglycemia.

## 2023-10-27 NOTE — Consult Note (Signed)
 Hospital Consult    Reason for Consult:  Right foot wound infection  Requesting Physician:  Dr Fidencio Hue MD  MRN #:  [id number]  History of Present Illness: This is a 53 y.o. male with medical history significant of DM, atherosclerosis of native arteries of the extremities with ulceration, diabetic foot ulcer, HTN, HLD, depression, chronic venous stasis in both legs, pancreatitis, peripheral neuropathy, morbid obesity with BMI of 47, who presents with right foot wound infection. Pt state that he has right foot wound for more than 3 weeks, which has been progressively worsening, with foul smell. His right foot is painful, which is constant, sharp, moderate to severe, nonradiating.Patient was seen by vascular surgeon, Dr. Vonna Guardian on 5/13 and had a negative DVT study.  Dr. Vonna Guardian recommended an angiography with possible revascularization.  Vascular Surgery consulted to evaluate.  Past Medical History:  Diagnosis Date   Diabetes mellitus without complication (HCC)    Hypercholesteremia    Hypertension    Pancreatitis     Past Surgical History:  Procedure Laterality Date   ANKLE SURGERY Left 2001,2003   BACK SURGERY  [phone]    Allergies  Allergen Reactions   Morphine Other (See Comments)    headache     A HEADACHE AND SWEATING AND LETHARGIC headache  headache, ,  A HEADACHE AND SWEATING AND LETHARGIC headache   Morphine And Codeine Other (See Comments)    headache   Other Other (See Comments)    A HEADACHE AND SWEATING AND LETHARGIC    headache  A HEADACHE AND SWEATING AND LETHARGIC, , headache    Prior to Admission medications   Medication Sig Start Date End Date Taking? Authorizing Provider  acetaminophen  (TYLENOL ) 500 MG tablet Take 1,000 mg by mouth every 6 (six) hours as needed for mild pain or moderate pain.   Yes [provider]  atorvastatin  (LIPITOR) 20 MG tablet Take 1 tablet (20 mg total) by mouth daily. 06/02/21  Yes Luna Salinas, MD  busPIRone  (BUSPAR ) 15  MG tablet Take 15 mg by mouth 3 (three) times daily.   Yes [provider]  cetirizine (ZYRTEC) 10 MG tablet Take 10 mg by mouth daily.   Yes [provider]  cloNIDine  (CATAPRES ) 0.2 MG tablet Take 0.2 mg by mouth 3 (three) times daily.   Yes [provider]  DULoxetine  (CYMBALTA ) 60 MG capsule Take 60 mg by mouth daily.   Yes [provider]  HUMALOG  100 UNIT/ML injection Inject 25 Units into the skin 2 (two) times daily.   Yes [provider]  LANTUS  100 UNIT/ML injection SMARTSIG:100 Unit(s) SUB-Q Twice Daily   Yes [provider]  losartan  (COZAAR ) 100 MG tablet Take 100 mg by mouth. 04/23/22  Yes [provider]  nitroGLYCERIN (NITROSTAT) 0.4 MG SL tablet Place under the tongue. 10/02/23  Yes [provider]  pregabalin  (LYRICA ) 25 MG capsule Take 1 capsule (25 mg total) by mouth 3 (three) times daily. 07/05/21 10/19/24 Yes Heather Litter, MD  rOPINIRole (REQUIP) 1 MG tablet Take 1 mg by mouth at bedtime. 04/11/22  Yes [provider]  gemfibrozil  (LOPID ) 600 MG tablet Take 1 tablet (600 mg total) by mouth 2 (two) times daily before a meal. Patient not taking: Reported on 10/27/2023 01/14/17   Marquita Situ, MD  insulin  NPH Human (NOVOLIN N) 100 UNIT/ML injection Inject 0-50 Units into the skin 2 (two) times daily. Patient not taking: Reported on 10/27/2023    [provider]  insulin   regular (NOVOLIN R) 100 units/mL injection Inject into the skin See admin instructions. Inject twice daily according to blood glucose reading Patient not taking: Reported on 10/27/2023    [provider]  losartan -hydrochlorothiazide  (HYZAAR) 100-25 MG tablet Take 1 tablet by mouth daily. Patient not taking: Reported on 10/27/2023    [provider]  Multiple Vitamin (MULTIVITAMIN WITH MINERALS) TABS tablet Take 1 tablet by mouth daily. Patient not taking: Reported on 10/20/2023 06/02/21   Amin,  Sumayya, MD  naproxen sodium (ALEVE) 220 MG tablet Take 440 mg by mouth 2 (two) times daily as needed (pain). Patient not taking: Reported on 10/20/2023    [provider]  polyethylene glycol (MIRALAX  / GLYCOLAX ) packet Take 17 g by mouth daily. Patient not taking: Reported on 10/20/2023 01/15/17   Marquita Situ, MD  torsemide  (DEMADEX ) 20 MG tablet Take 1 tablet (20 mg total) by mouth daily. Patient not taking: Reported on 10/20/2023 06/01/21   Luna Salinas, MD    Social History   Socioeconomic History   Marital status: Married    Spouse name: Not on file   Number of children: Not on file   Years of education: Not on file   Highest education level: Not on file  Occupational History   Not on file  Tobacco Use   Smoking status: Former    Types: Cigarettes   Smokeless tobacco: Current    Types: Chew  Vaping Use   Vaping status: Never Used  Substance and Sexual Activity   Alcohol use: No    Alcohol/week: 0.0 standard drinks of alcohol   Drug use: No   Sexual activity: Yes    Partners: Male    Birth control/protection: None  Other Topics Concern   Not on file  Social History Narrative   Not on file   Social Drivers of Health   Financial Resource Strain: Not on file  Food Insecurity: Not on file  Transportation Needs: Not on file  Physical Activity: Not on file  Stress: Not on file  Social Connections: Unknown (12/05/2021)   Received from [facility]   Social Network    Social Network: Not on file  Intimate Partner Violence: Unknown (12/05/2021)   Received from [facility], [facility]   HITS    Physically Hurt: Not on file    Insult or Talk Down To: Not on file    Threaten Physical Harm: Not on file    Scream or Curse: Not on file     Family History  Problem Relation Age of Onset   Heart disease Mother    Diabetes Mother    Diabetes Sister     ROS: Otherwise negative unless mentioned in HPI  Physical  Examination  Vitals:   10/27/23 0318 10/27/23 0545  BP:  (!) 116/59  Pulse:  (!) 105  Resp:  (!) 27  Temp:  99.7 F (37.6 C)  SpO2: (!) 89% 100%   Body mass index is 47.43 kg/m.  General:  WDWN in NAD Gait: Not observed HENT: WNL, normocephalic Pulmonary: normal non-labored breathing, without Rales, rhonchi,  wheezing Cardiac: regular, without  Murmurs, rubs or gallops; without carotid bruits Abdomen: Positive bowel sounds throughout, soft, NT/ND, no masses Skin: without rashes Vascular Exam/Pulses: Right lower extremity with vascular insufficiency and gangrene with open wound of right great toe. Unable to palpate pulses.  Extremities: with ischemic changes, with Gangrene , with cellulitis; with open wounds;  Musculoskeletal: no muscle wasting or atrophy  Neurologic: A&O  X 3;  No focal weakness or paresthesias are detected; speech is fluent/normal Psychiatric:  The pt has Normal affect. Lymph:  Unremarkable  CBC    Component Value Date/Time   WBC 15.6 (H) 10/27/2023 0459   RBC 3.79 (L) 10/27/2023 0459   HGB 11.1 (L) 10/27/2023 0459   HGB 13.4 03/15/2014 0339   HCT 37.4 (L) 10/27/2023 0459   HCT 40.4 03/15/2014 0339   PLT 298 10/27/2023 0459   PLT 173 03/15/2014 0339   MCV 98.7 10/27/2023 0459   MCV 94 03/15/2014 0339   MCH 29.3 10/27/2023 0459   MCHC 29.7 (L) 10/27/2023 0459   RDW 15.9 (H) 10/27/2023 0459   RDW 13.1 03/15/2014 0339   LYMPHSABS 1.4 10/26/2023 1817   LYMPHSABS 2.2 03/12/2014 0600   MONOABS 0.9 10/26/2023 1817   MONOABS 1.3 (H) 03/12/2014 0600   EOSABS 0.1 10/26/2023 1817   EOSABS 0.0 03/12/2014 0600   BASOSABS 0.0 10/26/2023 1817   BASOSABS 0.1 03/12/2014 0600    BMET    Component Value Date/Time   NA 132 (L) 10/27/2023 0459   NA 135 (L) 03/15/2014 0339   K 4.9 10/27/2023 0459   K 3.1 (L) 03/15/2014 0339   CL 97 (L) 10/27/2023 0459   CL 104 03/15/2014 0339   CO2 28 10/27/2023 0459   CO2 24 03/15/2014 0339   GLUCOSE 134 (H) 10/27/2023  0459   GLUCOSE 127 (H) 03/15/2014 0339   BUN 34 (H) 10/27/2023 0459   BUN 9 03/15/2014 0339   CREATININE 1.30 (H) 10/27/2023 0459   CREATININE 0.64 03/15/2014 0339   CALCIUM  8.0 (L) 10/27/2023 0459   CALCIUM  8.4 (L) 03/15/2014 0339   GFRNONAA >60 10/27/2023 0459   GFRNONAA >60 03/15/2014 0339   GFRAA >60 02/28/2018 0429   GFRAA >60 03/15/2014 0339    COAGS: Lab Results  Component Value Date   INR 1.2 10/26/2023   INR 1.1 07/04/2021   INR 1.02 10/12/2009     Non-Invasive Vascular Imaging:   X-ray right foot: 1. Large soft tissue ulceration of the distal first toe with marked erosive changes of the first distal phalanx extending to the interphalangeal joint space. 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 2. Soft tissue gas surrounding the first proximal phalanx, concerning for infection with gas-forming organism.  Statin:  Yes.   Beta Blocker:  No. Aspirin :  No. ACEI:  No. ARB:  Yes.   CCB use:  No Other antiplatelets/anticoagulants:  No.    ASSESSMENT/PLAN: This is a 53 y.o. male who presents to [facility] emergency department with right foot wound infection.  X-ray of his foot shows a large soft tissue ulceration of the distal left first toe with marked erosive changes of the first distal phalanx extending to the interphalangeal joint space.  These findings are consistent with osteomyelitis/septic Ortho.  There is also noted soft tissue gas surrounding the proximal phalanx which is concerning for gas-forming infection.  Therefore vascular surgery recommends patient undergo a right lower extremity above-the-knee amputation.  Vascular surgery plans on taking the patient to the operating room tomorrow 10/28/2023 for right above-the-knee amputation.  I discussed in detail at the bedside separately with the patient the procedure, benefits, risk, and complications.  Patient verbalizes understanding and wishes to proceed.  I answered all the patient's questions.   Patient was made n.p.o. after midnight tonight for surgery tomorrow.   -I discussed the case in detail with Dr. Devon Fogo MD and he agrees with the plan.   Cooper [place]  [facility] 10/27/2023 7:19 AM

## 2023-10-27 NOTE — Consult Note (Signed)
 WOC Nurse Consult Note: patient had been followed at wound care center for multiple lower extremity wounds (including R great toe); last seen in that office 06/2023; recently seen by vascular surgeon  Reason for Consult: R foot wound  Wound type: full thickness r/t diabetes/PAD  Pressure Injury POA: NA  Measurement: see nursing flowsheet  Wound VHQ:IONGE yellow necrotic tissue, plantar aspect noted to be macerated; toes with black eschar tips, noted lower leg with dark eschar  Drainage (amount, consistency, odor) foul smelling per MD note  Periwound: erythema, edema  Dressing procedure/placement/frequency:  Cleanse R foot (wounds and intact skin) with Vashe wound cleanser [employer] [phone]) do not rinse and allow to air dry.  Apply Xeroform gauze ([place] [phone])  to dorsal and plantar aspect of foot to include all toes.  Cover with dry gauze and Kerlix roll gauze.    Lower leg noted to have full thickness wound with eschar, can cover with Xeroform gauze as well.   Patient is pending podiatry evaluation of this wound and vascular surgery is involved.  Any wound care orders placed by podiatry supercede wound care orders placed by [facility] team.   POC discussed with bedside nurse. WOC team will not follow. Re-consult if further needs arise.   Thank you,    Ronni Colace MSN, RN-BC, [employer] [phone]

## 2023-10-27 NOTE — ED Notes (Signed)
 Patient was transferred up to the ICU by Rapid Response Team.

## 2023-10-27 NOTE — Consult Note (Signed)
 NAME: Martin Riddle  DOB: [birth year]  MRN: [id number]  Date/Time: 10/27/2023 4:19 PM  REQUESTING PROVIDER: Dr. Hubert Madden Subjective:  REASON FOR CONSULT: Foot infection osteomyelitis No history available from patient.  Spoke to his wife at bedside. Chart reviewed in detail  Martin Riddle is a 53 y.o. male with a history of diabetes mellitus hypertension, hypercholesterolemia, extensive back surgery, chronic venous stasis legs, pancreatitis, peripheral neuropathy, morbid obesity Presented to the ED with right foot infection As per his wife he has had it for at least a few weeks.  Patient admits multiple wound care visits and hence it was difficult for them to get into the clinic.  And while the wife was trying to unwrap the wound she saw maggots falling out of the wrapping and he was reluctantly brought to the ED. Vitals in the ED  10/26/23 18:03  BP 116/63  Temp 98.2 F (36.8 C)  Pulse Rate 96  Resp 22 !  SpO2 91 %     Latest Reference Range & Units 10/26/23 18:17  WBC 4.0 - 10.5 K/uL 11.3 (H)  Hemoglobin 13.0 - 17.0 g/dL 40.9 (L)  HCT 81.1 - 91.4 % 36.2 (L)  Platelets 150 - 400 K/uL 238  Creatinine 0.61 - 1.24 mg/dL 7.82   Blood culture sent Admitted as severe sepsis due to diabetic foot infection and started on broad spectrum antibiotics Podiatrist was consulted who recommended BKA Seen by vascular and they are taking for amputation tomorrow PT had 159 BS on the metabolic panel and layer had a BG of 25>22- In ICU getting D50, glucagon I am asked to see the patient for management of the foot infection As per wife pt has been non compliant with medical visits-  He has had multiple back surgeries, had hardware He does not do much at home- usually sits in a chair Past Medical History:  Diagnosis Date   Diabetes mellitus without complication (HCC)    Hypercholesteremia    Hypertension    Pancreatitis     Past Surgical History:  Procedure Laterality Date   ANKLE SURGERY  Left 2001,2003   BACK SURGERY  2003,2012    Social History   Socioeconomic History   Marital status: Married    Spouse name: Not on file   Number of children: Not on file   Years of education: Not on file   Highest education level: Not on file  Occupational History   Not on file  Tobacco Use   Smoking status: Former    Types: Cigarettes   Smokeless tobacco: Current    Types: Chew  Vaping Use   Vaping status: Never Used  Substance and Sexual Activity   Alcohol use: No    Alcohol/week: 0.0 standard drinks of alcohol   Drug use: No   Sexual activity: Yes    Partners: Male    Birth control/protection: None  Other Topics Concern   Not on file  Social History Narrative   Not on file   Social Drivers of Health   Financial Resource Strain: Not on file  Food Insecurity: Patient Declined (10/27/2023)   Hunger Vital Sign    Worried About Running Out of Food in the Last Year: Patient declined    Ran Out of Food in the Last Year: Patient declined  Transportation Needs: Patient Declined (10/27/2023)   PRAPARE - Administrator, Civil Service (Medical): Patient declined    Lack of Transportation (Non-Medical): Patient declined  Physical Activity: Not on file  Stress: Not on file  Social Connections: Patient Declined (10/27/2023)   Social Connection and Isolation Panel [NHANES]    Frequency of Communication with Friends and Family: Patient declined    Frequency of Social Gatherings with Friends and Family: Patient declined    Attends Religious Services: Patient declined    Database administrator or Organizations: Patient declined    Attends Banker Meetings: Patient declined    Marital Status: Patient declined  Intimate Partner Violence: Patient Declined (10/27/2023)   Humiliation, Afraid, Rape, and Kick questionnaire    Fear of Current or Ex-Partner: Patient declined    Emotionally Abused: Patient declined    Physically Abused: Patient declined     Sexually Abused: Patient declined    Family History  Problem Relation Age of Onset   Heart disease Mother    Diabetes Mother    Diabetes Sister    Allergies  Allergen Reactions   Morphine Other (See Comments)    headache     A HEADACHE AND SWEATING AND LETHARGIC headache  headache, ,  A HEADACHE AND SWEATING AND LETHARGIC headache   Morphine And Codeine Other (See Comments)    headache   Other Other (See Comments)    A HEADACHE AND SWEATING AND LETHARGIC    headache  A HEADACHE AND SWEATING AND LETHARGIC, , headache   I? Current Facility-Administered Medications  Medication Dose Route Frequency Provider Last Rate Last Admin   acetaminophen  (TYLENOL ) tablet 650 mg  650 mg Oral Q6H PRN Niu, Xilin, MD   650 mg at 10/26/23 2205   arformoterol (BROVANA) nebulizer solution 15 mcg  15 mcg Nebulization BID Althia Atlas, MD       Cecily Cohen ON 10/29/2023] ascorbic acid (VITAMIN C) tablet 500 mg  500 mg Oral Daily Althia Atlas, MD       aspirin  EC tablet 81 mg  81 mg Oral Daily Niu, Xilin, MD       atorvastatin  (LIPITOR) tablet 20 mg  20 mg Oral Daily Niu, Xilin, MD       budesonide  (PULMICORT ) nebulizer solution 0.25 mg  0.25 mg Nebulization BID Althia Atlas, MD       busPIRone  (BUSPAR ) tablet 15 mg  15 mg Oral TID Niu, Xilin, MD   15 mg at 10/27/23 1523   Chlorhexidine  Gluconate Cloth 2 % PADS 6 each  6 each Topical Daily Althia Atlas, MD   6 each at 10/27/23 1253   cloNIDine  (CATAPRES ) tablet 0.2 mg  0.2 mg Oral TID Althia Atlas, MD   0.2 mg at 10/27/23 1523   cyanocobalamin (VITAMIN B12) injection 1,000 mcg  1,000 mcg Intramuscular Q1200 Althia Atlas, MD   1,000 mcg at 10/27/23 1526   Followed by   Cecily Cohen ON 11/03/2023] cyanocobalamin (VITAMIN B12) tablet 1,000 mcg  1,000 mcg Oral Daily Althia Atlas, MD       dextrose  10 % infusion   Intravenous Continuous Althia Atlas, MD 75 mL/hr at 10/27/23 1256 Rate Change at 10/27/23 1256   dextrose  50 % solution 50 mL  50 mL Intravenous PRN  Niu, Xilin, MD   50 mL at 10/27/23 1447   DULoxetine  (CYMBALTA ) DR capsule 20 mg  20 mg Oral QHS Jadapalle, Sree, MD       DULoxetine  (CYMBALTA ) DR capsule 60 mg  60 mg Oral Daily Niu, Xilin, MD       furosemide  (LASIX ) injection 40 mg  40 mg Intravenous BID Althia Atlas, MD       haloperidol   lactate (HALDOL ) injection 2 mg  2 mg Intravenous Q6H PRN Althia Atlas, MD       heparin  injection 5,000 Units  5,000 Units Subcutaneous Q8H Niu, Xilin, MD   5,000 Units at 10/27/23 1449   hydrALAZINE  (APRESOLINE ) injection 5 mg  5 mg Intravenous Q2H PRN Niu, Xilin, MD       HYDROmorphone  (DILAUDID ) injection 1 mg  1 mg Intravenous Q3H PRN Niu, Xilin, MD   1 mg at 10/27/23 1523   ipratropium-albuterol  (DUONEB) 0.5-2.5 (3) MG/3ML nebulizer solution 3 mL  3 mL Nebulization Q6H PRN Althia Atlas, MD       Cecily Cohen ON 10/29/2023] iron polysaccharides (NIFEREX) capsule 150 mg  150 mg Oral Daily Althia Atlas, MD       LORazepam  (ATIVAN ) tablet 0.5 mg  0.5 mg Oral Q8H PRN Niu, Xilin, MD   0.5 mg at 10/27/23 0147   methylPREDNISolone  sodium succinate (SOLU-MEDROL ) 125 mg/2 mL injection 125 mg  125 mg Intravenous Once Althia Atlas, MD       nitroGLYCERIN (NITROSTAT) SL tablet 0.4 mg  0.4 mg Sublingual Q5 min PRN Niu, Xilin, MD       ondansetron  (ZOFRAN ) injection 4 mg  4 mg Intravenous Q8H PRN Niu, Xilin, MD   4 mg at 10/26/23 2124   Oral care mouth rinse  15 mL Mouth Rinse 4 times per day Althia Atlas, MD       Oral care mouth rinse  15 mL Mouth Rinse PRN Althia Atlas, MD       oxyCODONE -acetaminophen  (PERCOCET/ROXICET) 5-325 MG per tablet 1 tablet  1 tablet Oral Q4H PRN Niu, Xilin, MD   1 tablet at 10/27/23 0458   piperacillin -tazobactam (ZOSYN ) IVPB 3.375 g  3.375 g Intravenous Q8H Niu, Xilin, MD   Stopped at 10/27/23 9562   pregabalin  (LYRICA ) capsule 25 mg  25 mg Oral TID Niu, Xilin, MD   25 mg at 10/27/23 1539   vancomycin  (VANCOREADY) IVPB 1750 mg/350 mL  1,750 mg Intravenous Q12H Niu, Xilin, MD          Abtx:  Anti-infectives (From admission, onward)    Start     Dose/Rate Route Frequency Ordered Stop   10/27/23 1000  vancomycin  (VANCOREADY) IVPB 1750 mg/350 mL        1,750 mg 175 mL/hr over 120 Minutes Intravenous Every 12 hours 10/26/23 2322     10/27/23 0300  piperacillin -tazobactam (ZOSYN ) IVPB 3.375 g        3.375 g 12.5 mL/hr over 240 Minutes Intravenous Every 8 hours 10/26/23 2305     10/26/23 2230  vancomycin  (VANCOCIN ) IVPB 1000 mg/200 mL premix  Status:  Discontinued       Placed in "And" Linked Group   1,000 mg 200 mL/hr over 60 Minutes Intravenous Every 24 hours 10/26/23 2048 10/27/23 1100   10/26/23 2130  piperacillin -tazobactam (ZOSYN ) IVPB 3.375 g        3.375 g 100 mL/hr over 30 Minutes Intravenous  Once 10/26/23 2041 10/26/23 2157   10/26/23 2100  vancomycin  (VANCOREADY) IVPB 1500 mg/300 mL  Status:  Discontinued       Placed in "And" Linked Group   1,500 mg 150 mL/hr over 120 Minutes Intravenous Every 24 hours 10/26/23 2048 10/27/23 1100   10/26/23 2045  vancomycin  (VANCOREADY) IVPB 2000 mg/400 mL  Status:  Discontinued        2,000 mg 200 mL/hr over 120 Minutes Intravenous  Once 10/26/23 2041 10/26/23 2048  REVIEW OF SYSTEMS:  NA Objective:  VITALS:  BP 121/65   Pulse 81   Temp 98.3 F (36.8 C) (Oral)   Resp (!) 21   Ht 6\' 5"  (1.956 m)   Wt (!) 150.2 kg   SpO2 92%   BMI 39.27 kg/m   PHYSICAL EXAM:  General: pt is awake but dozes off as he is talking, obese Then he was placed on BIPAP  Head: Normocephalic, without obvious abnormality, atraumatic. Eyes: cannot examine Neck, symmetrical, no adenopathy, thyroid: non tender no carotid bruit and no JVD. Lungs: b/l air entry Heart:s1s2 Abdomen: Soft, non-tender,not distended. Bowel sounds normal. No masses Extremities:  Edema both legs Erythema rt foot- necrotic foul smelling ulcerating wound great toe  Left great toe-chronic cabbed wound      Skin: as above Lymph: Cervical,  supraclavicular normal. Neurologic: did not examine in detail Pertinent Labs Lab Results CBC    Component Value Date/Time   WBC 15.6 (H) 10/27/2023 0459   RBC 3.79 (L) 10/27/2023 0459   HGB 11.1 (L) 10/27/2023 0459   HGB 13.4 03/15/2014 0339   HCT 37.4 (L) 10/27/2023 0459   HCT 40.4 03/15/2014 0339   PLT 298 10/27/2023 0459   PLT 173 03/15/2014 0339   MCV 98.7 10/27/2023 0459   MCV 94 03/15/2014 0339   MCH 29.3 10/27/2023 0459   MCHC 29.7 (L) 10/27/2023 0459   RDW 15.9 (H) 10/27/2023 0459   RDW 13.1 03/15/2014 0339   LYMPHSABS 1.4 10/26/2023 1817   LYMPHSABS 2.2 03/12/2014 0600   MONOABS 0.9 10/26/2023 1817   MONOABS 1.3 (H) 03/12/2014 0600   EOSABS 0.1 10/26/2023 1817   EOSABS 0.0 03/12/2014 0600   BASOSABS 0.0 10/26/2023 1817   BASOSABS 0.1 03/12/2014 0600       Latest Ref Rng & Units 10/27/2023    4:59 AM 10/26/2023    6:17 PM 07/07/2021    7:04 PM  CMP  Glucose 70 - 99 mg/dL 295  621  308   BUN 6 - 20 mg/dL 34  32  21   Creatinine 0.61 - 1.24 mg/dL 6.57  8.46  9.62   Sodium 135 - 145 mmol/L 132  131  135   Potassium 3.5 - 5.1 mmol/L 4.9  4.0  4.2   Chloride 98 - 111 mmol/L 97  94  95   CO2 22 - 32 mmol/L 28  24  31    Calcium  8.9 - 10.3 mg/dL 8.0  8.3  95.2   Total Protein 6.5 - 8.1 g/dL  7.7  7.9   Total Bilirubin 0.0 - 1.2 mg/dL  0.7  0.7   Alkaline Phos 38 - 126 U/L  63  49   AST 15 - 41 U/L  21  30   ALT 0 - 44 U/L  18  33       Microbiology: Recent Results (from the past 240 hours)  Blood culture (single)     Status: None (Preliminary result)   Collection Time: 10/26/23  6:17 PM   Specimen: BLOOD RIGHT HAND  Result Value Ref Range Status   Specimen Description BLOOD RIGHT HAND  Final   Special Requests   Final    BOTTLES DRAWN AEROBIC AND ANAEROBIC Blood Culture results may not be optimal due to an inadequate volume of blood received in culture bottles   Culture   Final    NO GROWTH < 12 HOURS Performed at [facility], [address] [place], [place] [address]  Report Status PENDING  Incomplete  Culture, blood (single)     Status: None (Preliminary result)   Collection Time: 10/26/23  8:04 PM   Specimen: BLOOD  Result Value Ref Range Status   Specimen Description BLOOD LA  Final   Special Requests   Final    BOTTLES DRAWN AEROBIC AND ANAEROBIC Blood Culture adequate volume   Culture   Final    NO GROWTH < 12 HOURS Performed at [facility], [address], [place], [place] [address]    Report Status PENDING  Incomplete  Resp panel by RT-PCR (RSV, Flu A&B, Covid) Anterior Nasal Swab     Status: None   Collection Time: 10/27/23  9:53 AM   Specimen: Anterior Nasal Swab  Result Value Ref Range Status   SARS Coronavirus 2 by RT PCR NEGATIVE NEGATIVE Final    Comment: (NOTE) SARS-CoV-2 target nucleic acids are NOT DETECTED.  The SARS-CoV-2 RNA is generally detectable in upper respiratory specimens during the acute phase of infection. The lowest concentration of SARS-CoV-2 viral copies this assay can detect is 138 copies/mL. A negative result does not preclude SARS-Cov-2 infection and should not be used as the sole basis for treatment or other patient management decisions. A negative result may occur with  improper specimen collection/handling, submission of specimen other than nasopharyngeal swab, presence of viral mutation(s) within the areas targeted by this assay, and inadequate number of viral copies(<138 copies/mL). A negative result must be combined with clinical observations, patient history, and epidemiological information. The expected result is Negative.  Fact Sheet for Patients:  BloggerCourse.com  Fact Sheet for Healthcare Providers:  SeriousBroker.it  This test is no t yet approved or cleared by the [place]  FDA and  has been authorized for detection and/or diagnosis of SARS-CoV-2 by FDA under an Emergency Use Authorization (EUA).  This EUA will remain  in effect (meaning this test can be used) for the duration of the COVID-19 declaration under Section 564(b)(1) of the Act, 21 U.S.C.section 360bbb-3(b)(1), unless the authorization is terminated  or revoked sooner.       Influenza A by PCR NEGATIVE NEGATIVE Final   Influenza B by PCR NEGATIVE NEGATIVE Final    Comment: (NOTE) The Xpert Xpress SARS-CoV-2/FLU/RSV plus assay is intended as an aid in the diagnosis of influenza from Nasopharyngeal swab specimens and should not be used as a sole basis for treatment. Nasal washings and aspirates are unacceptable for Xpert Xpress SARS-CoV-2/FLU/RSV testing.  Fact Sheet for Patients: BloggerCourse.com  Fact Sheet for Healthcare Providers: SeriousBroker.it  This test is not yet approved or cleared by the [place]  FDA and has been authorized for detection and/or diagnosis of SARS-CoV-2 by FDA under an Emergency Use Authorization (EUA). This EUA will remain in effect (meaning this test can be used) for the duration of the COVID-19 declaration under Section 564(b)(1) of the Act, 21 U.S.C. section 360bbb-3(b)(1), unless the authorization is terminated or revoked.     Resp Syncytial Virus by PCR NEGATIVE NEGATIVE Final    Comment: (NOTE) Fact Sheet for Patients: BloggerCourse.com  Fact Sheet for Healthcare Providers: SeriousBroker.it  This test is not yet approved or cleared by the [place]  FDA and has been authorized for detection and/or diagnosis of SARS-CoV-2 by FDA under an Emergency Use Authorization (EUA). This EUA will remain in effect (meaning this test can be used) for the duration of the COVID-19 declaration under Section 564(b)(1) of the Act, 21 U.S.C. section 360bbb-3(b)(1), unless the authorization is terminated or  revoked.  Performed at [facility], [address]  [place], [place] [address]   MRSA Next Gen by PCR, Nasal     Status: None   Collection Time: 10/27/23 12:59 PM   Specimen: Nasal Mucosa; Nasal Swab  Result Value Ref Range Status   MRSA by PCR Next Gen NOT DETECTED NOT DETECTED Final    Comment: (NOTE) The GeneXpert MRSA Assay (FDA approved for NASAL specimens only), is one component of a comprehensive MRSA colonization surveillance program. It is not intended to diagnose MRSA infection nor to guide or monitor treatment for MRSA infections. Test performance is not FDA approved in patients less than 6 years old. Performed at [facility], [address] [place], [place] [address]     IMAGING RESULTS: CXR no active disease I have personally reviewed the films ? Impression/Recommendation Diabetic foot infection with rt great toe ulcerating necrotic wound- foul smelling discharge with sepsis Pt is currently on pip tazo/vanco Seen by podiatrist and vascular surgeon for Amputation of the leg   Acute hypoxia and hypercapnic resp failure on BIPAP  Hypoglycemia  Encephalopathy due to above B/l venous edema ? H/o multiple back surgeries  Discussed with wife, his nurse   Note:  This document was prepared using Dragon voice recognition software and may include unintentional dictation errors.

## 2023-10-28 ENCOUNTER — Inpatient Hospital Stay: Admitting: Anesthesiology

## 2023-10-28 ENCOUNTER — Telehealth (INDEPENDENT_AMBULATORY_CARE_PROVIDER_SITE_OTHER): Payer: Self-pay | Admitting: Vascular Surgery

## 2023-10-28 ENCOUNTER — Encounter
Admission: EM | Disposition: A | Discharge status: Skilled Nursing Facility | Payer: Self-pay | Source: Home / Self Care | Attending: Internal Medicine

## 2023-10-28 ENCOUNTER — Other Ambulatory Visit: Payer: Self-pay

## 2023-10-28 DIAGNOSIS — I96 Gangrene, not elsewhere classified: Secondary | ICD-10-CM

## 2023-10-28 DIAGNOSIS — L089 Local infection of the skin and subcutaneous tissue, unspecified: Secondary | ICD-10-CM | POA: Diagnosis not present

## 2023-10-28 DIAGNOSIS — Z7189 Other specified counseling: Secondary | ICD-10-CM | POA: Diagnosis not present

## 2023-10-28 DIAGNOSIS — A419 Sepsis, unspecified organism: Secondary | ICD-10-CM | POA: Diagnosis not present

## 2023-10-28 DIAGNOSIS — R6520 Severe sepsis without septic shock: Secondary | ICD-10-CM | POA: Diagnosis not present

## 2023-10-28 DIAGNOSIS — I7025 Atherosclerosis of native arteries of other extremities with ulceration: Secondary | ICD-10-CM | POA: Diagnosis not present

## 2023-10-28 DIAGNOSIS — E11628 Type 2 diabetes mellitus with other skin complications: Secondary | ICD-10-CM | POA: Diagnosis not present

## 2023-10-28 HISTORY — PX: AMPUTATION: SHX166

## 2023-10-28 LAB — BASIC METABOLIC PANEL WITH GFR
Anion gap: 10 (ref 5–15)
BUN: 41 mg/dL — ABNORMAL HIGH (ref 6–20)
CO2: 26 mmol/L (ref 22–32)
Calcium: 8.1 mg/dL — ABNORMAL LOW (ref 8.9–10.3)
Chloride: 99 mmol/L (ref 98–111)
Creatinine, Ser: 1.21 mg/dL (ref 0.61–1.24)
GFR, Estimated: >60 mL/min (ref >60)
Glucose, Bld: 104 mg/dL — ABNORMAL HIGH (ref 70–99)
Potassium: 4.4 mmol/L (ref 3.5–5.1)
Sodium: 135 mmol/L (ref 135–145)

## 2023-10-28 LAB — GLUCOSE, CAPILLARY
Glucose-Capillary: 135 mg/dL — ABNORMAL HIGH (ref 70–99)
Glucose-Capillary: 79 mg/dL (ref 70–99)
Glucose-Capillary: 55 mg/dL — ABNORMAL LOW (ref 70–99)
Glucose-Capillary: 119 mg/dL — ABNORMAL HIGH (ref 70–99)
Glucose-Capillary: 84 mg/dL (ref 70–99)
Glucose-Capillary: 84 mg/dL (ref 70–99)
Glucose-Capillary: 74 mg/dL (ref 70–99)
Glucose-Capillary: 69 mg/dL — ABNORMAL LOW (ref 70–99)
Glucose-Capillary: 74 mg/dL (ref 70–99)
Glucose-Capillary: 114 mg/dL — ABNORMAL HIGH (ref 70–99)
Glucose-Capillary: 65 mg/dL — ABNORMAL LOW (ref 70–99)
Glucose-Capillary: 222 mg/dL — ABNORMAL HIGH (ref 70–99)
Glucose-Capillary: 85 mg/dL (ref 70–99)
Glucose-Capillary: 77 mg/dL (ref 70–99)
Glucose-Capillary: 30 mg/dL — CL (ref 70–99)

## 2023-10-28 LAB — CBC
HCT: 32.7 % — ABNORMAL LOW (ref 39.0–52.0)
Hemoglobin: 9.7 g/dL — ABNORMAL LOW (ref 13.0–17.0)
MCH: 28.6 pg (ref 26.0–34.0)
MCHC: 29.7 g/dL — ABNORMAL LOW (ref 30.0–36.0)
MCV: 96.5 fL (ref 80.0–100.0)
Platelets: 269 10*3/uL (ref 150–400)
RBC: 3.39 MIL/uL — ABNORMAL LOW (ref 4.22–5.81)
RDW: 16.1 % — ABNORMAL HIGH (ref 11.5–15.5)
WBC: 8.2 10*3/uL (ref 4.0–10.5)
nRBC: 0 % (ref 0.0–0.2)

## 2023-10-28 LAB — BLOOD GAS, ARTERIAL
Acid-Base Excess: 4.7 mmol/L — ABNORMAL HIGH (ref 0.0–2.0)
Bicarbonate: 32 mmol/L — ABNORMAL HIGH (ref 20.0–28.0)
FIO2: 60 %
MECHVT: 500 mL
Mechanical Rate: 24
O2 Saturation: 99.6 %
PEEP: 10 cmH2O
Patient temperature: 37
pCO2 arterial: 58 mmHg — ABNORMAL HIGH (ref 32–48)
pH, Arterial: 7.35 (ref 7.35–7.45)
pO2, Arterial: 168 mmHg — ABNORMAL HIGH (ref 83–108)

## 2023-10-28 LAB — PHOSPHORUS: Phosphorus: 6.4 mg/dL — ABNORMAL HIGH (ref 2.5–4.6)

## 2023-10-28 LAB — TRIGLYCERIDES: Triglycerides: 154 mg/dL — ABNORMAL HIGH (ref <150)

## 2023-10-28 LAB — MAGNESIUM: Magnesium: 2.5 mg/dL — ABNORMAL HIGH (ref 1.7–2.4)

## 2023-10-28 SURGERY — AMPUTATION, ABOVE KNEE
Anesthesia: General | Site: Knee | Laterality: Right

## 2023-10-28 SURGERY — AMPUTATION, FOOT, TRANSMETATARSAL
Anesthesia: Choice | Site: Toe | Laterality: Right

## 2023-10-28 MED ORDER — MIDAZOLAM HCL 2 MG/2ML IJ SOLN
INTRAMUSCULAR | Status: AC
Start: 2023-10-28 — End: ?
  Filled 2023-10-28: qty 2

## 2023-10-28 MED ORDER — PHENYLEPHRINE HCL (PRESSORS) 10 MG/ML IV SOLN
INTRAVENOUS | Status: DC | PRN
  Administered 2023-10-28: 80 ug via INTRAVENOUS

## 2023-10-28 MED ORDER — CEFAZOLIN SODIUM 1 G IJ SOLR
INTRAMUSCULAR | Status: AC
  Filled 2023-10-28: qty 20

## 2023-10-28 MED ORDER — DEXTROSE 10 % IV SOLN: INTRAVENOUS | Status: DC

## 2023-10-28 MED ORDER — SODIUM CHLORIDE 0.9 % IV SOLN: INTRAVENOUS | Status: DC | PRN

## 2023-10-28 MED ORDER — DEXTROSE-SODIUM CHLORIDE 5-0.9 % IV SOLN: INTRAVENOUS | Status: DC

## 2023-10-28 MED ORDER — SODIUM CHLORIDE 0.9% IV SOLUTION: Freq: Once | INTRAVENOUS | Status: DC

## 2023-10-28 MED ORDER — EPHEDRINE 5 MG/ML INJ
INTRAVENOUS | Status: AC
  Filled 2023-10-28: qty 5

## 2023-10-28 MED ORDER — DEXTROSE 50 % IV SOLN
12.5000 g | Freq: Once | INTRAVENOUS | Status: AC
  Administered 2023-10-28: 12.5 g via INTRAVENOUS

## 2023-10-28 MED ORDER — ROCURONIUM BROMIDE 10 MG/ML (PF) SYRINGE
PREFILLED_SYRINGE | INTRAVENOUS | Status: AC
Start: 2023-10-28 — End: 2023-10-28
  Filled 2023-10-28: qty 10

## 2023-10-28 MED ORDER — 0.9 % SODIUM CHLORIDE (POUR BTL) OPTIME
TOPICAL | Status: DC | PRN
  Administered 2023-10-28: 1000 mL

## 2023-10-28 MED ORDER — KETAMINE HCL 50 MG/5ML IJ SOSY
PREFILLED_SYRINGE | INTRAMUSCULAR | Status: AC
  Filled 2023-10-28: qty 5

## 2023-10-28 MED ORDER — ROCURONIUM BROMIDE 100 MG/10ML IV SOLN
INTRAVENOUS | Status: DC | PRN
  Administered 2023-10-28 (×2): 50 mg via INTRAVENOUS

## 2023-10-28 MED ORDER — MIDAZOLAM HCL 2 MG/2ML IJ SOLN
INTRAMUSCULAR | Status: DC | PRN
  Administered 2023-10-28 (×2): 2 mg via INTRAVENOUS

## 2023-10-28 MED ORDER — VASHE WOUND IRRIGATION OPTIME
TOPICAL | Status: DC | PRN
  Administered 2023-10-28: 34 [oz_av]

## 2023-10-28 MED ORDER — KETAMINE HCL 10 MG/ML IJ SOLN
INTRAMUSCULAR | Status: DC | PRN
  Administered 2023-10-28: 50 mg via INTRAVENOUS

## 2023-10-28 MED ORDER — ALBUMIN HUMAN 5 % IV SOLN
INTRAVENOUS | Status: DC | PRN
Start: 2023-10-28 — End: 2023-10-28

## 2023-10-28 MED ORDER — MIDAZOLAM HCL 2 MG/2ML IJ SOLN
INTRAMUSCULAR | Status: AC
  Filled 2023-10-28: qty 2

## 2023-10-28 SURGICAL SUPPLY — 43 items
BLADE SAW SAG 25.4X90 (BLADE) ×1 IMPLANT
BLADE SURG SZ10 CARB STEEL (BLADE) ×1 IMPLANT
BNDG COHESIVE 4X5 TAN STRL LF (GAUZE/BANDAGES/DRESSINGS) ×1 IMPLANT
BNDG ELASTIC 6X5.8 VLCR NS LF (GAUZE/BANDAGES/DRESSINGS) ×1 IMPLANT
BNDG GAUZE DERMACEA FLUFF 4 (GAUZE/BANDAGES/DRESSINGS) ×1 IMPLANT
CANISTER WOUND CARE 500ML ATS (WOUND CARE) IMPLANT
CHLORAPREP W/TINT 26 (MISCELLANEOUS) ×1 IMPLANT
DERMABOND ADVANCED .7 DNX12 (GAUZE/BANDAGES/DRESSINGS) ×1 IMPLANT
DRAPE INCISE IOBAN 66X45 STRL (DRAPES) ×1 IMPLANT
DRSG GAUZE FLUFF 36X18 (GAUZE/BANDAGES/DRESSINGS) ×1 IMPLANT
DRSG VAC GRANUFOAM MED (GAUZE/BANDAGES/DRESSINGS) IMPLANT
ELECT CAUTERY BLADE 6.4 (BLADE) ×1 IMPLANT
ELECTRODE REM PT RTRN 9FT ADLT (ELECTROSURGICAL) ×1 IMPLANT
GLOVE BIO SURGEON STRL SZ7 (GLOVE) ×1 IMPLANT
GLOVE SURG SYN 8.0 (GLOVE) ×1 IMPLANT
GLOVE SURG SYN 8.0 PF PI (GLOVE) ×1 IMPLANT
GOWN STRL REUS W/ TWL LRG LVL3 (GOWN DISPOSABLE) ×2 IMPLANT
GOWN STRL REUS W/ TWL XL LVL3 (GOWN DISPOSABLE) ×1 IMPLANT
HANDLE YANKAUER SUCT BULB TIP (MISCELLANEOUS) ×1 IMPLANT
KIT TURNOVER KIT A (KITS) ×1 IMPLANT
MANIFOLD NEPTUNE II (INSTRUMENTS) ×1 IMPLANT
MAT ABSORB FLUID 56X50 GRAY (MISCELLANEOUS) ×1 IMPLANT
NDL HYPO 22X1.5 SAFETY MO (MISCELLANEOUS) IMPLANT
NEEDLE HYPO 22X1.5 SAFETY MO (MISCELLANEOUS) IMPLANT
NS IRRIG 500ML POUR BTL (IV SOLUTION) ×1 IMPLANT
PACK EXTREMITY ARMC (MISCELLANEOUS) ×1 IMPLANT
PAD PREP OB/GYN DISP 24X41 (PERSONAL CARE ITEMS) ×1 IMPLANT
PENCIL SMOKE EVACUATOR (MISCELLANEOUS) ×1 IMPLANT
SPONGE T-LAP 18X18 ~~LOC~~+RFID (SPONGE) ×2 IMPLANT
STAPLER SKIN PROX 35W (STAPLE) IMPLANT
STOCKINETTE M/LG 89821 (MISCELLANEOUS) ×1 IMPLANT
SUT ETHIBOND 0 36 GRN (SUTURE) IMPLANT
SUT SILK 2-0 18XBRD TIE 12 (SUTURE) IMPLANT
SUT SILK 3-0 18XBRD TIE 12 (SUTURE) ×1 IMPLANT
SUT VIC AB 0 CT1 36 (SUTURE) ×4 IMPLANT
SUT VIC AB 3-0 SH 27X BRD (SUTURE) IMPLANT
SUT VICRYL PLUS ABS 0 54 (SUTURE) ×1 IMPLANT
SUT VICRYL+ 3-0 36IN CT-1 (SUTURE) ×2 IMPLANT
SUTURE MNCRL 4-0 27XMF (SUTURE) ×1 IMPLANT
SYR 20ML LL LF (SYRINGE) IMPLANT
TAPE UMBILICAL 1/8 X36 TWILL (MISCELLANEOUS) ×1 IMPLANT
TRAP FLUID SMOKE EVACUATOR (MISCELLANEOUS) ×1 IMPLANT
WATER STERILE IRR 500ML POUR (IV SOLUTION) ×1 IMPLANT

## 2023-10-28 NOTE — Anesthesia Postprocedure Evaluation (Signed)
 Anesthesia Post Note  Patient: Martin Riddle  Procedure(s) Performed: AMPUTATION, ABOVE KNEE (Right: Knee)  Patient location during evaluation: PACU Anesthesia Type: General Level of consciousness: awake and alert Pain management: pain level controlled Vital Signs Assessment: post-procedure vital signs reviewed and stable Respiratory status: spontaneous breathing, nonlabored ventilation, respiratory function stable and patient connected to nasal cannula oxygen Cardiovascular status: blood pressure returned to baseline and stable Postop Assessment: no apparent nausea or vomiting Anesthetic complications: no   No notable events documented.   Last Vitals:  Vitals:   10/28/23 2000 10/28/23 2050  BP: (!) 114/57   Pulse: 62 65  Resp: (!) 24 13  Temp:    SpO2: 96% 100%    Last Pain:  Vitals:   10/28/23 1930  TempSrc: Axillary  PainSc:                  Zula Hitch

## 2023-10-28 NOTE — Transfer of Care (Signed)
 Immediate Anesthesia Transfer of Care Note  Patient: Martin Riddle  Procedure(s) Performed: AMPUTATION, ABOVE KNEE (Right: Knee)  Patient Location: ICU  Anesthesia Type:General  Level of Consciousness: Patient remains intubated per anesthesia plan  Airway & Oxygen Therapy: Patient remains intubated per anesthesia plan  Post-op Assessment: Report given to RN and Post -op Vital signs reviewed and stable  Post vital signs: Reviewed  Last Vitals:  Vitals Value Taken Time  BP 143/66 10/28/23 2315  Temp 97.61f   Pulse 70 10/28/23 2317  Resp 17 10/28/23 2317  SpO2 98 % 10/28/23 2317  Vitals shown include unfiled device data.  Last Pain:  Vitals:   10/28/23 1930  TempSrc: Axillary  PainSc:          Complications: No notable events documented.

## 2023-10-28 NOTE — Op Note (Signed)
  [facility]   OPERATIVE NOTE   PROCEDURE:  Right above-the-knee amputation  PRE-OPERATIVE DIAGNOSIS: Right foot gangrene  POST-OPERATIVE DIAGNOSIS: same as above  SURGEON:  Devon Fogo, MD  ASSISTANT(S): none  ANESTHESIA: general  ESTIMATED BLOOD LOSS: 800 cc  FINDING(S): Viable muscle bellies  no pus encountered   SPECIMEN(S):  Right above-the-knee amputation  INDICATIONS:   Martin Riddle is a 53 y.o. male who presents with right foot gangrene.  The patient is scheduled for a right above-the-knee amputation.  I discussed in depth with the patient the risks, benefits, and alternatives to this procedure.  The patient is aware that the risk of this operation included but are not limited to:  bleeding, infection, myocardial infarction, stroke, death, failure to heal amputation wound, and possible need for more proximal amputation.  The patient is aware of the risks and agrees proceed forward with the procedure.  DESCRIPTION: After full informed written consent was obtained from the patient, the patient was taken to the operating room, and placed supine upon the operating table.  Prior to induction, the patient received IV antibiotics.  The patient was then prepped and draped in the standard fashion for a right above-the-knee amputation.  After obtaining adequate anesthesia, the patient was prepped and draped in the standard fashion for a above-the-knee amputation.  I marked out the anterior and posterior flaps for a fish-mouth type of amputation.  I made the incisions for these flaps, and then dissected through the subcutaneous tissue, fascia, and muscles circumferentially.  I elevated  the periosteal tissue 4-5 cm more proximal than the anterior skin flap.  I then transected the femur with a power saw at this level.  Then I smoothed out the rough edges of the bone.  At this point, the specimen was passed off the field as the above-the-knee amputation.   At this point, I clamped all visibly bleeding arteries and veins using a combination of suture ligation with silk suture and electrocautery.   Bleeding continued to be controlled with electrocautery and suture ligature.  The stump was washed off with sterile normal saline and no further active bleeding was noted.  I reapproximated the anterior and posterior fascia  with interrupted stitches of 0 Vicryl.  This was completed along the entire length of anterior and posterior fascia until there were no more loose space in the fascial line. The subcutaneous tissue was then approximated with 2-0 vicryl sutures. The skin was then  reapproximated with staples.  The stump was washed off and dried.  The incision was dressed with Xeroform and ABD pads, and  then fluffs were applied.  Kerlix was wrapped around the leg and then gently an ACE wrap was applied.  A large Ioban was then placed over the ACE wrap to secure the dressing. The patient was then awakened and take to the recovery room in stable condition.   COMPLICATIONS: none  CONDITION: stable  Devon Fogo  10/28/2023, 11:18 PM   This note was created with Dragon Medical transcription system. Any errors in dictation are purely unintentional.

## 2023-10-28 NOTE — Anesthesia Preprocedure Evaluation (Signed)
 Anesthesia Evaluation  Patient identified by MRN, date of birth, ID band Patient unresponsive  General Assessment Comment:Intubated and sedated in ICU  Reviewed: Allergy & Precautions, NPO status , Patient's Chart, lab work & pertinent test results  History of Anesthesia Complications Negative for: history of anesthetic complications  Airway Mallampati: Unable to assess       Dental   Pulmonary sleep apnea , former smoker Currently intubated and sedated for hypoxic respiratory failure 2/2 pulmonary edema   Pulmonary exam normal breath sounds clear to auscultation       Cardiovascular hypertension, + Peripheral Vascular Disease   Rhythm:Regular Rate:Tachycardia     Neuro/Psych Chronic pain  Neuromuscular disease (peripheral neuropathy)    GI/Hepatic ,GERD  ,,  Endo/Other  diabetes, Type 2  Class 3 obesity  Renal/GU      Musculoskeletal   Abdominal   Peds  Hematology  (+) Blood dyscrasia, anemia   Anesthesia Other Findings   Reproductive/Obstetrics                              Anesthesia Physical Anesthesia Plan  ASA: 3  Anesthesia Plan: General   Post-op Pain Management:    Induction: Intravenous  PONV Risk Score and Plan: 2 and Ondansetron , Dexamethasone and Treatment may vary due to age or medical condition  Airway Management Planned: Oral ETT  Additional Equipment:   Intra-op Plan:   Post-operative Plan: Post-operative intubation/ventilation  Informed Consent: I have reviewed the patients History and Physical, chart, labs and discussed the procedure including the risks, benefits and alternatives for the proposed anesthesia with the patient or authorized representative who has indicated his/her understanding and acceptance.     Dental advisory given and Consent reviewed with POA  Plan Discussed with: CRNA  Anesthesia Plan Comments: (Patient's wife Esequiel Hector at  bedside for history and consent.  Discussed risks of anesthesia including but not limited to:  - adverse reactions to medications - damage to eyes, teeth, lips or other oral mucosa - nerve damage due to positioning  - sore throat or hoarseness - damage to heart, brain, nerves, lungs, other parts of body or loss of life  Informed patient's wife about role of CRNA in peri- and intra-operative care; she voiced understanding.)         Anesthesia Quick Evaluation

## 2023-10-28 NOTE — Telephone Encounter (Signed)
 I called the patient's spouse Esequiel Hector on her cell phone.  It went to voicemail.  Purpose of the call was to obtain consent for the surgery today  Unfortunately consent for the right above-knee amputation was not obtained prior to the patient's intubation.  I will plan for surgery today if we can obtain consent.

## 2023-10-28 NOTE — Interval H&P Note (Signed)
 History and Physical Interval Note:  10/28/2023 8:01 PM  Martin Riddle  has presented today for surgery, with the diagnosis of SEPSIS Due to Osteomyelitis right lower extremity.  The various methods of treatment have been discussed with the patient and family. After consideration of risks, benefits and other options for treatment, the patient has consented to  Procedure(s): AMPUTATION, ABOVE KNEE (Right) as a surgical intervention.  The patient's history has been reviewed, patient examined, no change in status, stable for surgery.  I have reviewed the patient's chart and labs.  Questions were answered to the patient's satisfaction.     Devon Fogo

## 2023-10-28 NOTE — Progress Notes (Signed)
 Initial Nutrition Assessment  DOCUMENTATION CODES:   Obesity unspecified  INTERVENTION:   -If pt unable to extubate within 48 hours, recommend:  Initiate Pivot 1.5 @ 20 ml/hr and increase by 10 ml every 4 hours to goal rate of 50 ml/hr.   60 ml Prostat TF20 4 times daily.    Tube feeding regimen provides 2120 kcal (100% of needs), 193 grams of protein, and 900 ml of H2O. Total free water : 1080 ml daily   -MVI with minerals daily -500 mg zinc  sulfate daily -220 mg zinc  sulfate daily x 14 days -1 packet Juven BID, each packet provides 95 calories, 2.5 grams of protein (collagen), and 9.8 grams of carbohydrate (3 grams sugar); also contains 7 grams of L-arginine and L-glutamine, 300 mg vitamin C, 15 mg vitamin E, 1.2 mcg vitamin B-12, 9.5 mg zinc , 200 mg calcium , and 1.5 g  Calcium  Beta-hydroxy-Beta-methylbutyrate to support wound healing   NUTRITION DIAGNOSIS:   Inadequate oral intake related to inability to eat as evidenced by NPO status.  GOAL:   Provide needs based on ASPEN/SCCM guidelines  MONITOR:   Vent status  REASON FOR ASSESSMENT:   Ventilator    ASSESSMENT:   Pt with medical history significant of DM, atherosclerosis of native arteries of the extremities with ulceration, diabetic foot ulcer, HTN, HLD, depression, chronic venous stasis in both legs, pancreatitis, peripheral neuropathy, morbid obesity with BMI of 47, who presents with right foot wound infection.  Pt admitted with severe sepsis due to rt foot DM infection with rt foot osteomyelitis.   5/20- IVC d/c, intubated  Patient is currently intubated on ventilator support. OGT currently clamped. Per KUB on 10/27/23, tip of tube confirmed in stomach.  MV: 12 L/min Temp (24hrs), Avg:98.1 F (36.7 C), Min:98 F (36.7 C), Max:98.3 F (36.8 C)  Propofol : 31.5 ml/hr (provides 832 kcals daily)   Reviewed I/O's: +791 ml x 24 hours  UOP: 1.6 L x 24 hours  Per [facility] notes, pt with full thickness round on rt  foot (related to DM and PVD) and full thickness wound on rt lower leg.   Plan for rt AKA per vascular surgery. Consent has been obtained.   Case discussed with RN, MD, and during ICU rounds. Pt's CBGS were dropping this AM; D5 infusion started and CBGS have been trending in the 80's now. Pt is able to follow commands and responds to pain.   Palliative care following for goals of care discussion. Prior to intubation, pt did not desire comfort care. Pt has been refusing to go to appointments PTA and wife was doing pt's wounds dressings (pt has had wound "for a long time" but did not go to the wound center for appointments).   Medications reviewed and include vitamin C, vitamin B-12, colace, solu-medrol , miralax , dextrose  5%-0.9% sodium chloride  infusion @ 100 ml/hr, and levophed .  Lab Results  Component Value Date   HGBA1C 8.9 (H) 10/27/2023   PTA DM medications are Lantus  100 units BID and Humalog  25 units BID.   Labs reviewed: Phos: 6.4, Mg: 2.5, CBGS: 55-119 (inpatient orders for glycemic control are none).    NUTRITION - FOCUSED PHYSICAL EXAM:  Flowsheet Row Most Recent Value  Orbital Region Mild depletion  Upper Arm Region No depletion  Thoracic and Lumbar Region No depletion  Buccal Region No depletion  Temple Region No depletion  Clavicle Bone Region No depletion  Clavicle and Acromion Bone Region No depletion  Scapular Bone Region No depletion  Dorsal Hand No depletion  Patellar Region No depletion  Anterior Thigh Region No depletion  Posterior Calf Region No depletion  Edema (RD Assessment) Moderate  Hair Reviewed  Eyes Reviewed  Mouth Reviewed  Skin Reviewed  Nails Reviewed       Diet Order:   Diet Order             Diet NPO time specified Except for: Sips with Meds  Diet effective midnight                   EDUCATION NEEDS:   Not appropriate for education at this time  Skin:  Skin Assessment: Skin Integrity Issues: Skin Integrity Issues:: Other  (Comment) Other: rt foot gangrene with maggots, full thickness wounds on rt lower leg and rt foot (related to DM and PVD) and  Last BM:  Unknown  Height:   Ht Readings from Last 1 Encounters:  10/27/23 6\' 5"  (1.956 m)    Weight:   Wt Readings from Last 1 Encounters:  10/27/23 (!) 150.2 kg    Ideal Body Weight:  86.9 kg (adjustef for upcoming rt AKA)  BMI:  Body mass index is 39.27 kg/m.  Estimated Nutritional Needs:   Kcal:  7829-5621  Protein:  175-200 grams  Fluid:  2.0-2.2 L    Herschel Lords, RD, LDN, CDCES Registered Dietitian III Certified Diabetes Care and Education Specialist If unable to reach this RD, please use "RD Inpatient" group chat on secure chat between hours of 8am-4 pm daily

## 2023-10-28 NOTE — Progress Notes (Signed)
 NAME:  Martin Riddle, MRN:  [id number], DOB:  12-27-1970, LOS: 2 ADMISSION DATE:  10/26/2023, CONSULTATION DATE: 10/27/2023 REFERRING MD: Dr. Hubert Madden, CHIEF COMPLAINT: Right Foot Wound   History of Present Illness:  This is a 53 yo male who presented to [facility] ER on 05/19 with a wound to his right foot resulting in worsening pain.  Per ER notes pts wife reported the wound initially started off as a blister that popped and has gradually worsened.  He was evaluated by vascular surgery in the outpatient setting on 05/13 with nonhealing ulcerations.  Vascular Surgery recommended angiography with possible revascularization during that office visit     ED Course Upon arrival to the ER significant lab results were: Na+ 131/chloride 94/glucose 159/BUN 32/albumin 3.0/lactic acid 2.3/wbc 11.3/hgb 10.9.  Right foot x-ray revealed erosion of the distal phalanx concerning for acute on chronic osteomyelitis as well as osteomyelitis of the proximal phalanx and soft tissue emphysema about the first MPJ concerning for gas gangrene.  Vital signs were stable.  Pt started on vancomycin  and zosyn .  Pt subsequently admitted to the medsurg unit per hospitalist team for additional workup and treatment.  However, he remained in the ER pending bed availability.  See detailed hospital course below under significant events.   Pertinent  Medical History  Type II Diabetes Mellitus  Hypercholesteremia  HTN  Pancreatitis  Atherosclerosis of Bilateral Lower Extremities with Ulceration  Former Smoker   Significant Hospital Events: Including procedures, antibiotic start and stop dates in addition to other pertinent events   05/19: Pt admitted to the [facility] unit with gangrene of right foot, however he remained in the ER pending bed availability 05/20: Rapid response initiated pt minimally responsive found to be hypoglycemic CBG <10 pt received 1 amp of D50W and placed on Bipap and transported to the stepdown unit.  He was  initially IVC'd by psychiatry due to concern of inability to make sound medical decisions for himself.  Pts mentation improved and he was transitioned to nasal canula with improvement in CBG's.  Psych rescinded the IVC and pt deemed competent to make medical decisions.  Unfortunately, later during the shift pt became unresponsive and hypoxic requiring mechanical intubation 10/28/23- patient has not received surgery yet and is possibly going for OR in AM. Remains on MV critically ill.   Interim History / Subjective:  Pt sedated and mechanically intubated.    Objective    Blood pressure (!) 114/50, pulse 61, temperature 98.3 F (36.8 C), temperature source Axillary, resp. rate (!) 24, height 6\' 5"  (1.956 m), weight (!) 150.2 kg, SpO2 100%.    Vent Mode: PRVC FiO2 (%):  [30 %-60 %] 40 % Set Rate:  [18 bmp-24 bmp] 24 bmp Vt Set:  [500 mL] 500 mL PEEP:  [10 cmH20] 10 cmH20 Pressure Support:  [5 cmH20-6 cmH20] 6 cmH20 Plateau Pressure:  [20 cmH20] 20 cmH20   Intake/Output Summary (Last 24 hours) at 10/28/2023 1031 Last data filed at 10/28/2023 1000 Gross per 24 hour  Intake 2810.1 ml  Output 1925 ml  Net 885.1 ml   Filed Weights   10/26/23 1804 10/27/23 1247  Weight: (!) 181.4 kg (!) 150.2 kg    Examination: General: Acute on chronically-ill appearing male, NAD mechanically intubated  HENT: Difficult to assess for JVD due to body habitus, orally intubated   Lungs: Diminished throughout, even, non labored  Cardiovascular: Sinus rhythm with depressed T wave, no m/r/g, 2+ radial/1+ distal pulses via doppler, 2+ bilateral lower extremity  edema  Abdomen: +BS x4, obese, soft, non distended  Extremities/Skin: see images below  Right foot   Left foot   Left foot   Neuro: Sedated/paralyzed, not following commands, PERRL  GU: Pending indwelling catheter placement   Resolved problem list   Assessment and Plan   #Acute toxic metabolic encephalopathy  #Mechanical intubation  pain/discomfort  - Correct metabolic derangements  - Maintain RASS goal 0 to -1 - PAD protocol to maintain RASS goal: propofol  and fentanyl  gtts  - WUA daily  - Avoid sedating medication as able   #Hypotension: sedation related and septic shock #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  Hx: Hypercholesteremia and HTN  - Continuous telemetry monitoring  - Continue outpatient aspirin  and atorvastatin   - Hold outpatient antihypertensives and diuretics  - Prn levophed  gtt to maintain map 65 or higher  - Troponin pending   #Acute hypoxic hypercapnic respiratory failure suspect secondary to OHS due to morbid obesity  #Mechanical intubation  - Full vent support for now: vent settings reviewed and established  - Continue lung protective strategies  - Maintain plateau pressures less than 30 cm H2O - VAP bundle implemented  - IV and nebulized steroids  - Prn bronchodilator therapy  - Intermittent CXR and ABG's - SBT once all parameters met   #Acute kidney injury likely secondary to ATN #Hyponatremia  - Trend BMP and lactic acid  - Replace electrolytes as indicated  - Strict I&O's - Avoid nephrotoxic agents as able   #Sepsis secondary to severe right foot infection/osteomyelitis with possible gas gangrene of the first MPJ - Trend WBC and monitor fever curve  - Trend PCT - Follow cultures  - Podiatry consulted and felt due to severity of infection recommended proximal limb amputation for source control Vascular Surgery consulted for RLE amputation  - ID consulted appreciate input: abx per recommendations  #Iron deficiency anemia  - Trend CBC  - Monitor for s/sx of bleeding  - Transfuse for hgb <7 - Continue iron supplementation   #Hypoglycemia~improving  #Type II diabetes mellitus  Hemoglobin A1c 10/27/23: 8.9 - CBG's q1hr for now  - Follow hypo/hyperglycemic protocol  - Diabetes coordinator consulted appreciate input - Hold  subcutaneous insulin  for now until CBG's are consistently over 180 mg/dl  - Target CBG range 409 to 180   Palliative care consulted to assist with goals of treatment.  Pt is High Risk For Cardiac Arrest and Sudden Death.  Recommend changing code status to DNR Best Practice (right click and "Reselect all SmartList Selections" daily)   Diet/type: NPO DVT prophylaxis prophylactic heparin   Pressure ulcer(s): N/A GI prophylaxis: H2B Lines: N/A Foley:  Yes, and it is still needed Code Status:  full code Last date of multidisciplinary goals of care discussion [10/27/2023]   Labs   CBC: Recent Labs  Lab 10/26/23 1817 10/27/23 0459 10/28/23 0316  WBC 11.3* 15.6* 8.2  NEUTROABS 8.7*  --   --   HGB 10.9* 11.1* 9.7*  HCT 36.2* 37.4* 32.7*  MCV 96.5 98.7 96.5  PLT 238 298 269    Basic Metabolic Panel: Recent Labs  Lab 10/26/23 1817 10/27/23 0459 10/28/23 0316  NA 131* 132* 135  K 4.0 4.9 4.4  CL 94* 97* 99  CO2 24 28 26   GLUCOSE 159* 134* 104*  BUN 32* 34* 41*  CREATININE 1.20 1.30* 1.21  CALCIUM  8.3* 8.0* 8.1*  MG  --  2.2 2.5*  PHOS  --  7.3* 6.4*  GFR: Estimated Creatinine Clearance: 113.3 mL/min (by C-G formula based on SCr of 1.21 mg/dL). Recent Labs  Lab 10/26/23 1817 10/26/23 2040 10/26/23 2331 10/27/23 0146 10/27/23 0459 10/27/23 1900 10/27/23 2107 10/28/23 0316  WBC 11.3*  --   --   --  15.6*  --   --  8.2  LATICACIDVEN 2.3*   < > 1.8 1.0  --  0.7 1.0  --    < > = values in this interval not displayed.    Liver Function Tests: Recent Labs  Lab 10/26/23 1817  AST 21  ALT 18  ALKPHOS 63  BILITOT 0.7  PROT 7.7  ALBUMIN 3.0*   No results for input(s): "LIPASE", "AMYLASE" in the last 168 hours. No results for input(s): "AMMONIA" in the last 168 hours.  ABG    Component Value Date/Time   PHART 7.35 10/28/2023 0154   PCO2ART 58 (H) 10/28/2023 0154   PO2ART 168 (H) 10/28/2023 0154   HCO3 32.0 (H) 10/28/2023 0154   ACIDBASEDEF 8.7 (H)  02/19/2018 1533   O2SAT 99.6 10/28/2023 0154     Coagulation Profile: Recent Labs  Lab 10/26/23 2136  INR 1.2    Cardiac Enzymes: No results for input(s): "CKTOTAL", "CKMB", "CKMBINDEX", "TROPONINI" in the last 168 hours.  HbA1C: Hemoglobin A1C  Date/Time Value Ref Range Status  03/11/2014 06:20 AM 12.3 (H) 4.2 - 6.3 % Final    Comment:    The American Diabetes Association recommends that a primary goal of therapy should be <7% and that physicians should reevaluate the treatment regimen in patients with HbA1c values consistently >8%.    Hgb A1c MFr Bld  Date/Time Value Ref Range Status  10/27/2023 01:02 PM 8.9 (H) 4.8 - 5.6 % Final    Comment:    (NOTE) Pre diabetes:          5.7%-6.4%  Diabetes:              >6.4%  Glycemic control for   <7.0% adults with diabetes   05/31/2021 07:43 AM 6.3 (H) 4.8 - 5.6 % Final    Comment:    (NOTE)         Prediabetes: 5.7 - 6.4         Diabetes: >6.4         Glycemic control for adults with diabetes: <7.0     CBG: Recent Labs  Lab 10/28/23 0416 10/28/23 0606 10/28/23 0640 10/28/23 0724 10/28/23 0942  GLUCAP 79 55* 119* 84 84    Review of Systems:   Unable to assess pt mechanically intubated   Past Medical History:  He,  has a past medical history of Diabetes mellitus without complication (HCC), Hypercholesteremia, Hypertension, and Pancreatitis.   Surgical History:   Past Surgical History:  Procedure Laterality Date   ANKLE SURGERY Left 2001,2003   BACK SURGERY  [phone]     Social History:   reports that he has quit smoking. His smoking use included cigarettes. His smokeless tobacco use includes chew. He reports that he does not drink alcohol and does not use drugs.   Family History:  His family history includes Diabetes in his mother and sister; Heart disease in his mother.   Allergies Allergies  Allergen Reactions   Morphine Other (See Comments)    headache     A HEADACHE AND SWEATING AND  LETHARGIC headache  headache, ,  A HEADACHE AND SWEATING AND LETHARGIC headache   Morphine And Codeine Other (See Comments)    headache  Other Other (See Comments)    A HEADACHE AND SWEATING AND LETHARGIC    headache  A HEADACHE AND SWEATING AND LETHARGIC, , headache     Home Medications  Prior to Admission medications   Medication Sig Start Date End Date Taking? Authorizing Provider  acetaminophen  (TYLENOL ) 500 MG tablet Take 1,000 mg by mouth every 6 (six) hours as needed for mild pain or moderate pain.   Yes [provider]  atorvastatin  (LIPITOR) 20 MG tablet Take 1 tablet (20 mg total) by mouth daily. 06/02/21  Yes Luna Salinas, MD  busPIRone  (BUSPAR ) 15 MG tablet Take 15 mg by mouth 3 (three) times daily.   Yes [provider]  cetirizine (ZYRTEC) 10 MG tablet Take 10 mg by mouth daily.   Yes [provider]  cloNIDine  (CATAPRES ) 0.2 MG tablet Take 0.2 mg by mouth 3 (three) times daily.   Yes [provider]  DULoxetine  (CYMBALTA ) 60 MG capsule Take 60 mg by mouth daily.   Yes [provider]  HUMALOG  100 UNIT/ML injection Inject 25 Units into the skin 2 (two) times daily.   Yes [provider]  LANTUS  100 UNIT/ML injection SMARTSIG:100 Unit(s) SUB-Q Twice Daily   Yes [provider]  losartan  (COZAAR ) 100 MG tablet Take 100 mg by mouth. 04/23/22  Yes [provider]  nitroGLYCERIN (NITROSTAT) 0.4 MG SL tablet Place under the tongue. 10/02/23  Yes [provider]  pregabalin  (LYRICA ) 25 MG capsule Take 1 capsule (25 mg total) by mouth 3 (three) times daily. 07/05/21 10/19/24 Yes Heather Litter, MD  rOPINIRole (REQUIP) 1 MG tablet Take 1 mg by mouth at bedtime. 04/11/22  Yes [provider]  gemfibrozil  (LOPID ) 600 MG tablet Take 1 tablet (600 mg total) by mouth 2 (two) times daily before a meal. Patient not taking: Reported on 10/27/2023 01/14/17   Marquita Situ, MD  insulin  NPH  Human (NOVOLIN N) 100 UNIT/ML injection Inject 0-50 Units into the skin 2 (two) times daily. Patient not taking: Reported on 10/27/2023    [provider]  insulin  regular (NOVOLIN R) 100 units/mL injection Inject into the skin See admin instructions. Inject twice daily according to blood glucose reading Patient not taking: Reported on 10/27/2023    [provider]  losartan -hydrochlorothiazide  (HYZAAR) 100-25 MG tablet Take 1 tablet by mouth daily. Patient not taking: Reported on 10/27/2023    [provider]  Multiple Vitamin (MULTIVITAMIN WITH MINERALS) TABS tablet Take 1 tablet by mouth daily. Patient not taking: Reported on 10/20/2023 06/02/21   Amin, Sumayya, MD  naproxen sodium (ALEVE) 220 MG tablet Take 440 mg by mouth 2 (two) times daily as needed (pain). Patient not taking: Reported on 10/20/2023    [provider]  polyethylene glycol (MIRALAX  / GLYCOLAX ) packet Take 17 g by mouth daily. Patient not taking: Reported on 10/20/2023 01/15/17   Marquita Situ, MD  torsemide  (DEMADEX ) 20 MG tablet Take 1 tablet (20 mg total) by mouth daily. Patient not taking: Reported on 10/20/2023 06/01/21   Luna Salinas, MD     Critical care provider statement:   Total critical care time: 33 minutes   Performed by: Jaclynn Mast MD   Critical care time was exclusive of separately billable procedures and treating other patients.   Critical care was necessary to treat or prevent imminent or life-threatening deterioration.   Critical care was time spent personally by me on the following activities: development of treatment plan with patient and/or surrogate as well as  nursing, discussions with consultants, evaluation of patient's response to treatment, examination of patient, obtaining history from patient or surrogate, ordering and performing treatments and interventions, ordering and review of laboratory studies, ordering and review of radiographic studies, pulse  oximetry and re-evaluation of patient's condition.    Jodelle Fausto, M.D.  Pulmonary & Critical Care Medicine

## 2023-10-28 NOTE — Progress Notes (Signed)
 Date of Admission:  10/26/2023      ID: Martin Riddle is a 53 y.o. male  Principal Problem:   Diabetic foot infection (HCC) Active Problems:   Atherosclerosis of native arteries of the extremities with ulceration (HCC)   HTN (hypertension)   HLD (hyperlipidemia)   Type 2 diabetes mellitus with peripheral neuropathy (HCC)   Obesity, Class III, BMI 40-49.9 (morbid obesity)   Severe sepsis (HCC)   Depression   Osteomyelitis of right foot (HCC)   Hypoglycemia   Gas gangrene (HCC)    Subjective: Pt is intubated  Medications:   sodium chloride    Intravenous Once   arformoterol  15 mcg Nebulization BID   [START ON 10/29/2023] ascorbic acid  500 mg Oral Daily   aspirin   81 mg Per Tube Daily   atorvastatin   20 mg Per Tube Daily   budesonide  (PULMICORT ) nebulizer solution  0.25 mg Nebulization BID   Chlorhexidine  Gluconate Cloth  6 each Topical Daily   cyanocobalamin  1,000 mcg Intramuscular Q1200   Followed by   Cecily Cohen ON 11/03/2023] vitamin B-12  1,000 mcg Oral Daily   docusate  100 mg Per Tube BID   heparin   5,000 Units Subcutaneous Q8H   [START ON 10/29/2023] iron polysaccharides  150 mg Per Tube Daily   methylPREDNISolone  (SOLU-MEDROL ) injection  125 mg Intravenous Once   mouth rinse  15 mL Mouth Rinse Q2H   polyethylene glycol  17 g Per Tube Daily    Objective: Vital signs in last 24 hours: Patient Vitals for the past 24 hrs:  BP Temp Temp src Pulse Resp SpO2  10/28/23 1500 (!) 112/57 -- -- (!) 59 (!) 24 94 %  10/28/23 1445 (!) 110/56 -- -- (!) 58 (!) 24 96 %  10/28/23 1430 (!) 109/56 -- -- (!) 58 (!) 24 97 %  10/28/23 1415 (!) 101/52 -- -- (!) 56 (!) 24 97 %  10/28/23 1400 (!) 104/54 -- -- (!) 58 (!) 24 98 %  10/28/23 1345 -- -- -- (!) 59 (!) 24 100 %  10/28/23 1344 (!) 104/51 -- -- (!) 58 (!) 24 97 %  10/28/23 1330 107/60 -- -- 60 (!) 24 97 %  10/28/23 1315 -- -- -- (!) 56 (!) 24 98 %  10/28/23 1300 -- -- -- (!) 56 (!) 24 97 %  10/28/23 1246 (!) 103/53 -- --  (!) 56 (!) 24 94 %  10/28/23 1245 -- -- -- (!) 57 (!) 24 97 %  10/28/23 1230 (!) 99/50 -- -- (!) 56 (!) 24 99 %  10/28/23 1222 (!) 105/48 -- -- (!) 55 (!) 24 99 %  10/28/23 1215 (!) 91/43 -- -- (!) 56 (!) 21 99 %  10/28/23 1208 (!) 87/44 -- -- 60 (!) 24 98 %  10/28/23 1200 (!) 91/45 98 F (36.7 C) Oral (!) 58 (!) 24 98 %  10/28/23 1145 (!) 108/49 -- -- 61 (!) 24 100 %  10/28/23 1130 -- -- -- 60 (!) 24 98 %  10/28/23 1115 -- -- -- 60 (!) 24 96 %  10/28/23 1100 (!) 107/53 -- -- 60 (!) 24 95 %  10/28/23 1048 -- -- -- -- -- 97 %  10/28/23 1045 (!) 110/55 -- -- 60 (!) 24 96 %  10/28/23 1030 (!) 106/49 -- -- (!) 59 (!) 24 98 %  10/28/23 1015 (!) 110/50 -- -- 61 (!) 24 97 %  10/28/23 1000 (!) 116/51 -- -- 63 (!) 24 97 %  10/28/23 0945 129/63 -- -- 74 (!) 26 99 %  10/28/23 0930 (!) 117/50 -- -- 62 (!) 24 98 %  10/28/23 0915 (!) 114/50 -- -- 61 (!) 24 100 %  10/28/23 0900 (!) 113/56 -- -- 64 (!) 24 98 %  10/28/23 0845 (!) 114/53 -- -- 65 (!) 24 96 %  10/28/23 0830 (!) 130/55 -- -- 64 (!) 24 94 %  10/28/23 0815 (!) 101/46 -- -- 62 (!) 24 94 %  10/28/23 0803 -- -- -- -- -- 96 %  10/28/23 0800 (!) 97/46 -- -- (!) 59 (!) 24 96 %  10/28/23 0600 (!) 108/59 -- -- (!) 58 (!) 24 98 %  10/28/23 0500 109/63 -- -- 62 (!) 24 99 %  10/28/23 0453 -- -- -- 62 (!) 24 99 %  10/28/23 0400 (!) 102/55 98.3 F (36.8 C) Axillary 61 (!) 24 99 %  10/28/23 0300 (!) 99/53 -- -- 62 (!) 24 99 %  10/28/23 0200 (!) 96/53 -- -- 60 (!) 24 99 %  10/28/23 0100 -- -- -- 62 (!) 24 100 %  10/28/23 0030 (!) 99/50 98.1 F (36.7 C) Axillary 63 (!) 24 100 %  10/28/23 0005 -- -- -- 63 (!) 24 99 %  10/28/23 0000 (!) 93/51 -- -- 64 (!) 24 98 %  10/27/23 2300 (!) 102/57 -- -- 61 (!) 24 100 %  10/27/23 2200 (!) 109/58 -- -- 61 (!) 24 97 %  10/27/23 2100 (!) 99/58 -- -- 62 (!) 24 98 %  10/27/23 2047 106/60 -- -- 62 (!) 24 99 %  10/27/23 2000 (!) 95/52 -- -- 61 (!) 24 100 %  10/27/23 1930 (!) 99/53 98.1 F (36.7 C) Axillary 65  (!) 24 98 %  10/27/23 1915 (!) 122/56 -- -- 70 (!) 24 98 %  10/27/23 1900 (!) 111/59 -- -- 66 19 94 %  10/27/23 1845 (!) 109/54 -- -- 60 18 96 %  10/27/23 1830 (!) 114/50 -- -- 79 18 95 %  10/27/23 1815 (!) 127/57 -- -- 86 18 96 %  10/27/23 1800 (!) 105/37 -- -- 83 18 96 %  10/27/23 1745 126/63 -- -- 76 (!) 39 98 %  10/27/23 1730 134/64 -- -- 87 (!) 37 97 %  10/27/23 1715 (!) 115/54 -- -- 78 (!) 28 90 %       PHYSICAL EXAM:  General: Intubated, sedated and on pressors.  Lungs: b/l air entry Heart: s1s2. Abdomen: Soft, non-tender,not distended. Bowel sounds normal. No masses Extremities: rt foot- great toe necrtoizing infection with ulceration- edema leg, erythema Left leg edema and erythema  Lab Results    Latest Ref Rng & Units 10/28/2023    3:16 AM 10/27/2023    4:59 AM 10/26/2023    6:17 PM  CBC  WBC 4.0 - 10.5 K/uL 8.2  15.6  11.3   Hemoglobin 13.0 - 17.0 g/dL 9.7  16.1  09.6   Hematocrit 39.0 - 52.0 % 32.7  37.4  36.2   Platelets 150 - 400 K/uL 269  298  238        Latest Ref Rng & Units 10/28/2023    3:16 AM 10/27/2023    4:59 AM 10/26/2023    6:17 PM  CMP  Glucose 70 - 99 mg/dL 045  409  811   BUN 6 - 20 mg/dL 41  34  32   Creatinine 0.61 - 1.24 mg/dL 9.14  7.82  9.56   Sodium  135 - 145 mmol/L 135  132  131   Potassium 3.5 - 5.1 mmol/L 4.4  4.9  4.0   Chloride 98 - 111 mmol/L 99  97  94   CO2 22 - 32 mmol/L 26  28  24    Calcium  8.9 - 10.3 mg/dL 8.1  8.0  8.3   Total Protein 6.5 - 8.1 g/dL   7.7   Total Bilirubin 0.0 - 1.2 mg/dL   0.7   Alkaline Phos 38 - 126 U/L   63   AST 15 - 41 U/L   21   ALT 0 - 44 U/L   18       Microbiology:  Studies/Results: DG Abd 1 View Result Date: 10/27/2023 CLINICAL DATA:  OG tube placement EXAM: ABDOMEN - 1 VIEW COMPARISON:  02/19/2018 FINDINGS: Enteric tube tip slightly low lying in the left mid to lower quadrant presumably within body of stomach. Visualized gas pattern is unremarkable IMPRESSION: Enteric tube tip slightly  low lying in the left mid to lower quadrant presumably within body of stomach. Electronically Signed   By: Esmeralda Hedge M.D.   On: 10/27/2023 19:31   DG Chest Port 1 View Result Date: 10/27/2023 CLINICAL DATA:  ET and OG tube placement EXAM: PORTABLE CHEST 1 VIEW COMPARISON:  10/27/2023 FINDINGS: Interval intubation, tip of the endotracheal tube is about 4.6 cm superior to carina. Enteric tube tip below the diaphragm but incompletely assessed. Enlarged cardiomediastinal silhouette with vascular congestion and worsening airspace disease at the left base. Possible small left effusion. No pneumothorax IMPRESSION: 1. Interval intubation, tip of the endotracheal tube about 4.6 cm superior to carina. Enteric tube tip below the diaphragm but incompletely assessed. 2. Enlarged cardiomediastinal silhouette with vascular congestion and worsening airspace disease and/or effusion at the left base. Electronically Signed   By: Esmeralda Hedge M.D.   On: 10/27/2023 19:30   CT Angio Chest Pulmonary Embolism (PE) W or WO Contrast Result Date: 10/27/2023 CLINICAL DATA:  Diabetic right foot ulceration and hypoxia. EXAM: CT ANGIOGRAPHY CHEST WITH CONTRAST TECHNIQUE: Multidetector CT imaging of the chest was performed using the standard protocol during bolus administration of intravenous contrast. Multiplanar CT image reconstructions and MIPs were obtained to evaluate the vascular anatomy. RADIATION DOSE REDUCTION: This exam was performed according to the departmental dose-optimization program which includes automated exposure control, adjustment of the mA and/or kV according to patient size and/or use of iterative reconstruction technique. CONTRAST:  OMNIPAQUE  IOHEXOL  350 MG/ML SOLN COMPARISON:  None Available. FINDINGS: Cardiovascular: Pulmonary arterial opacification is not optimal on the study. There is no evidence of significant central or lobar pulmonary embolism. The main pulmonary artery is top-normal in caliber  measuring approximately 3 cm. The thoracic aorta is normal in caliber. No significant aortic atherosclerosis. The heart size is normal. No pericardial fluid identified. Calcified coronary artery plaque present. Mediastinum/Nodes: Mild relative enlargement of the right lobe of the thyroid gland compared to the left with dystrophic calcifications in the right lobe and a small 6 mm low-density nodule in the superior right lobe. No lymphadenopathy identified. Lungs/Pleura: No overt pulmonary edema, pleural fluid or pneumothorax. Bibasilar atelectasis and scarring. No focal masses identified. Upper Abdomen: No acute abnormality. Musculoskeletal: No chest wall abnormality. No acute or significant osseous findings. Review of the MIP images confirms the above findings. IMPRESSION: 1. Suboptimal peripheral pulmonary arterial opacification. No evidence of significant central or lobar pulmonary embolism. 2. Bibasilar atelectasis and scarring. 3. Calcified coronary artery plaque. 4. Mild relative  enlargement of the right lobe of the thyroid gland compared to the left with dystrophic calcifications in the right lobe and a small 6 mm low-density nodule in the superior right lobe. Not clinically significant; no follow-up imaging recommended (ref: J Am Coll Radiol. 2015 Feb;12(2): 143-50). Electronically Signed   By: Erica Hau M.D.   On: 10/27/2023 11:08   DG Chest Port 1 View Result Date: 10/27/2023 CLINICAL DATA:  Acute hypoxic respiratory failure. EXAM: PORTABLE CHEST 1 VIEW COMPARISON:  May 30, 2021. FINDINGS: The heart size and mediastinal contours are within normal limits. Stable minimal left basilar scarring. No acute pulmonary disease is noted. The visualized skeletal structures are unremarkable. IMPRESSION: No active disease. Electronically Signed   By: Rosalene Colon M.D.   On: 10/27/2023 10:50   DG Foot 2 Views Right Result Date: 10/26/2023 CLINICAL DATA:  Foot infection EXAM: RIGHT FOOT - 2 VIEW  COMPARISON:  Right foot x-ray 05/30/2021. FINDINGS: There is diffuse soft tissue swelling of the foot, particularly the first toe. There is soft tissue gas surrounding the first proximal phalanx. There is large soft tissue ulceration of the distal first toe. There are marked erosive changes of the mid and distal aspect of the first distal phalanx with fragmentation of the remaining proximal aspect of the first distal phalanx. This extends to the first interphalangeal joint space. The first proximal phalanx appears within normal limits. IMPRESSION: 1. Large soft tissue ulceration of the distal first toe with marked erosive changes of the first distal phalanx extending to the interphalangeal joint space. 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 2. Soft tissue gas surrounding the first proximal phalanx, concerning for infection with gas-forming organism. Electronically Signed   By: Tyron Gallon M.D.   On: 10/26/2023 22:11     Assessment/Plan: Diabetic foot infection with rt great toe ulcerating necrotic wound- foul smelling discharge with sepsis Pt is currently on pip tazo/vanco Will change vanco to dapto Seen by podiatrist and vascular surgeon for Amputation of the leg     Acute hypoxia and hypercapnic resp failure intubated   Hypoglycemia  on D50   ? H/o multiple back surgeries- hardware present   Discussed with his nurse

## 2023-10-28 NOTE — Procedures (Signed)
 Endotracheal Intubation: Patient required placement of an artificial airway secondary to Respiratory Failure  Consent: Emergent.   Hand washing performed prior to starting the procedure.   Medications administered for sedation prior to procedure:  Midazolam  2 mg IV,  rocuronium 60 mg IV, Fentanyl  150 mcg IV.    A time out procedure was called and correct patient, name, & ID confirmed. Needed supplies and equipment were assembled and checked to include ETT, 10 ml syringe, Glidescope, Mac and Miller blades, suction, oxygen and bag mask valve, end tidal CO2 monitor.   Patient was positioned to align the mouth and pharynx to facilitate visualization of the glottis.   Heart rate, SpO2 and blood pressure was continuously monitored during the procedure. Pre-oxygenation was conducted prior to intubation and endotracheal tube was placed through the vocal cords into the trachea.     The artificial airway was placed under direct visualization via glidescope route using a 8.0 ETT on the first attempt.  ETT was secured at 25 cm mark.  Placement was confirmed by auscuitation of lungs with good breath sounds bilaterally and no stomach sounds.  Condensation was noted on endotracheal tube.   Pulse ox 98%.  CO2 detector in place with appropriate color change.   Complications: None .    Chest radiograph ordered and pending.   Comments: OGT placed via glidescope.  Skiler Olden, M.D.  Pulmonary & Critical Care Medicine  [facility]

## 2023-10-28 NOTE — Progress Notes (Signed)
 Patient arrived from PACU from Surgery , this RN doing assessment, upon checking patients glucose it was 30. D10 hanging but off when arrived. Notified Bridget, NP orders to give 1 amp of Dextrose , and Increase D10 to 100 from 50.  Rosslyn Coons 10/28/2023

## 2023-10-28 NOTE — Progress Notes (Addendum)
 Daily Progress Note   Patient Name: Martin Riddle       Date: 10/28/2023 DOB: 09-20-1970  Age: 53 y.o. MRN#: [id number] Attending Physician: Erskin Hearing, MD Primary Care Physician: Thomasene Flemings, NP Admit Date: 10/26/2023  Reason for Consultation/Follow-up: Establishing goals of care  Subjective: Notes and labs reviewed.  Patient is currently resting in bed on ventilator support.  His daughter is at bedside.  She states patient has 2 children, her and her brother.  She states patient has been married to his current wife for around 6 to 7 years.  She discusses dynamics between the family members.  She discusses that her father mother and his wife have a "hoarder problem".  She states they have a number of dogs in their home.  She discusses that when she takes her children to see him, he has to come outside of the house to visit with them due to concerns for cleanliness of the house.  She states she sees/speaks to her father every few weeks to every few months.  She discusses that she is a very angry with her father for not desiring the care needed in order to keep him alive up to this point.  She discusses that she is upset with her stepmother for allowing his condition to deteriorate as it has.  She states that to her knowledge there is no healthcare power of attorney or other advanced directives.  She states she is aware that current plans are to proceed with surgery.  She states she is hopeful that her father's status will improve.  Called to speak with patient's wife.  She advises that she is headed to a an appointment herself and will intend to come to the hospital this afternoon to visit.  Discussed following up further at that time.  ADDENDUM: Returned to bedside to speak with wife.   Wife has not returned to bedside at time of follow-up visit.  Will reattempt tomorrow.  Length of Stay: 2  Current Medications: Scheduled Meds:   sodium chloride    Intravenous Once   arformoterol  15 mcg Nebulization BID   [START ON 10/29/2023] ascorbic acid  500 mg Oral Daily   aspirin   81 mg Per Tube Daily   atorvastatin   20 mg Per Tube Daily   budesonide  (PULMICORT ) nebulizer solution  0.25  mg Nebulization BID   Chlorhexidine  Gluconate Cloth  6 each Topical Daily   cyanocobalamin  1,000 mcg Intramuscular Q1200   Followed by   Cecily Cohen ON 11/03/2023] vitamin B-12  1,000 mcg Oral Daily   docusate  100 mg Per Tube BID   heparin   5,000 Units Subcutaneous Q8H   [START ON 10/29/2023] iron polysaccharides  150 mg Per Tube Daily   methylPREDNISolone  (SOLU-MEDROL ) injection  125 mg Intravenous Once   mouth rinse  15 mL Mouth Rinse Q2H   polyethylene glycol  17 g Per Tube Daily    Continuous Infusions:  sodium chloride  Stopped (10/27/23 1901)    ceFAZolin (ANCEF) IV     dextrose  5 % and 0.9 % NaCl 100 mL/hr at 10/28/23 1230   famotidine  (PEPCID ) IV 100 mL/hr at 10/28/23 1000   fentaNYL  infusion INTRAVENOUS 50 mcg/hr (10/28/23 1000)   norepinephrine  (LEVOPHED ) Adult infusion 2 mcg/min (10/28/23 1216)   piperacillin -tazobactam (ZOSYN )  IV 3.375 g (10/28/23 1059)   propofol  (DIPRIVAN ) infusion 35 mcg/kg/min (10/28/23 1053)   vancomycin  1,750 mg (10/28/23 1149)    PRN Meds: acetaminophen , dextrose , fentaNYL , ipratropium-albuterol , ondansetron  (ZOFRAN ) IV, mouth rinse, mouth rinse  Physical Exam Constitutional:      Comments: Eyes closed.  Pulmonary:     Comments: On ventilator support. Skin:    General: Skin is warm and dry.             Vital Signs: BP (!) 103/53   Pulse (!) 56   Temp 98 F (36.7 C) (Oral)   Resp (!) 24   Ht 6\' 5"  (1.956 m)   Wt (!) 150.2 kg   SpO2 94%   BMI 39.27 kg/m  SpO2: SpO2: 94 % O2 Device: O2 Device: Ventilator O2 Flow Rate: O2 Flow Rate (L/min):  6 L/min  Intake/output summary:  Intake/Output Summary (Last 24 hours) at 10/28/2023 1318 Last data filed at 10/28/2023 1000 Gross per 24 hour  Intake 2810.1 ml  Output 1925 ml  Net 885.1 ml   LBM: Last BM Date :  (Unknown.) Baseline Weight: Weight: (!) 181.4 kg Most recent weight: Weight: (!) 150.2 kg    Patient Active Problem List   Diagnosis Date Noted   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   Diabetic foot infection (HCC) 10/26/2023   HTN (hypertension) 10/26/2023   HLD (hyperlipidemia) 10/26/2023   Type 2 diabetes mellitus with peripheral neuropathy (HCC) 10/26/2023   Obesity, Class III, BMI 40-49.9 (morbid obesity) 10/26/2023   Severe sepsis (HCC) 10/26/2023   Depression 10/26/2023   Osteomyelitis of right foot (HCC) 10/26/2023   Hypoglycemia 10/26/2023   Atherosclerosis of native arteries of the extremities with ulceration (HCC) 10/20/2023   Hypertension    Failure to attend appointment 07/07/2022   Cervical radiculopathy 01/07/2022   Lumbar radiculopathy 01/07/2022   Pain syndrome, chronic 01/07/2022   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   History of lumbar spinal fusion 01/07/2022   Chronic venous stasis dermatitis of both lower extremities    Cellulitis 05/30/2021   Multiple open wounds of lower extremity, initial encounter 05/30/2021   Respiratory failure (HCC) 02/19/2018   Severe recurrent major depression with psychotic features (HCC) 01/09/2017   Congenital flat foot 01/16/2015   DDD (degenerative disc disease), lumbar 01/16/2015   Diabetes mellitus, type 2 (HCC) 01/16/2015   Hypercholesteremia 01/16/2015   Gastro-esophageal reflux disease without esophagitis 01/16/2015   Peripheral neuropathy 01/16/2015    Palliative Care Assessment & Plan     Recommendations/Plan: PMT will follow.  Code Status:  Code Status Orders  (From admission, onward)           Start     Ordered   10/26/23 2115  Full code  Continuous       Question:  By:  Answer:   Consent: discussion documented in EHR   10/26/23 2115           Code Status History     Date Active Date Inactive Code Status Order ID Comments User Context   05/30/2021 2008 06/01/2021 2059 Full Code [id number]  [place], Eric J, DO ED   01/10/2017 1131 01/15/2017 1433 Full Code [id number]  Evone Hoh, MD Inpatient   12/09/2015 1550 12/11/2015 1535 Full Code [id number]  [place] Carrel, MD Inpatient   01/27/2015 2007 01/30/2015 1345 Full Code [id number]  [place] Carrel, MD Inpatient    Thank you for allowing the Palliative Medicine Team to assist in the care of this patient.    Meribeth Standard, NP  Please contact Palliative Medicine Team phone at [phone] for questions and concerns.

## 2023-10-28 NOTE — Consult Note (Signed)
 NAME:  Martin Riddle, MRN:  [id number], DOB:  [birth year], LOS: 2 ADMISSION DATE:  10/26/2023, CONSULTATION DATE: 10/27/2023 REFERRING MD: Dr. Hubert Madden, CHIEF COMPLAINT: Right Foot Wound   History of Present Illness:  This is a 53 yo male who presented to [facility] ER on 05/19 with a wound to his right foot resulting in worsening pain.  Per ER notes pts wife reported the wound initially started off as a blister that popped and has gradually worsened.  He was evaluated by vascular surgery in the outpatient setting on 05/13 with nonhealing ulcerations.  Vascular Surgery recommended angiography with possible revascularization during that office visit     ED Course Upon arrival to the ER significant lab results were: Na+ 131/chloride 94/glucose 159/BUN 32/albumin 3.0/lactic acid 2.3/wbc 11.3/hgb 10.9.  Right foot x-ray revealed erosion of the distal phalanx concerning for acute on chronic osteomyelitis as well as osteomyelitis of the proximal phalanx and soft tissue emphysema about the first MPJ concerning for gas gangrene.  Vital signs were stable.  Pt started on vancomycin  and zosyn .  Pt subsequently admitted to the medsurg unit per hospitalist team for additional workup and treatment.  However, he remained in the ER pending bed availability.  See detailed hospital course below under significant events.   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  He was  initially IVC'd by psychiatry due to concern of inability to make sound medical decisions for himself.  Pts mentation improved and he was transitioned to nasal canula with improvement in CBG's.  Psych rescinded the IVC and pt deemed competent to make medical decisions.  Unfortunately, later during the shift pt became unresponsive and hypoxic requiring mechanical intubation 10/28/23- patient scheduled for amputation today due to critical limb ischemia with gangrene of bilateral lower extermities. He remains on MV with sedation.   Interim History / Subjective:  Pt sedated and mechanically intubated.    Objective    Blood pressure (!) 112/57, pulse (!) 59, temperature 98 F (36.7 C), temperature source Oral, resp. rate (!) 24, height 6\' 5"  (1.956 m), weight (!) 150.2 kg, SpO2 94%.    Vent Mode: PRVC FiO2 (%):  [30 %-60 %] 30 % Set Rate:  [18 bmp-24 bmp] 24 bmp Vt Set:  [500 mL] 500 mL PEEP:  [10 cmH20] 10 cmH20 Plateau Pressure:  [20 cmH20-23 cmH20] 23 cmH20   Intake/Output Summary (Last 24 hours) at 10/28/2023 1648 Last data filed at 10/28/2023 1000 Gross per 24 hour  Intake 2090.1 ml  Output 1925 ml  Net 165.1 ml   Filed Weights   10/26/23 1804 10/27/23 1247  Weight: (!) 181.4 kg (!) 150.2 kg    Examination: General: Acute on chronically-ill appearing male, NAD mechanically intubated  HENT: Difficult to assess for JVD due to body habitus, orally intubated   Lungs: Diminished throughout, even, non labored  Cardiovascular: Sinus rhythm with depressed T wave, no m/r/g, 2+ radial/1+ distal pulses via doppler, 2+ bilateral lower extremity edema  Abdomen: +  BS x4, obese, soft, non distended  Extremities/Skin: see images below  Right foot   Left foot   Left foot   Neuro: Sedated/paralyzed, not following commands, PERRL  GU: Pending indwelling catheter placement   Resolved problem list   Assessment and Plan   #Acute toxic metabolic encephalopathy  #Mechanical intubation  pain/discomfort  - Correct metabolic derangements  - Maintain RASS goal 0 to -1 - PAD protocol to maintain RASS goal: propofol  and fentanyl  gtts  - WUA daily  - Avoid sedating medication as able   #Hypotension: sedation related and septic shock #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  Hx: Hypercholesteremia and HTN  - Continuous telemetry monitoring  - Continue outpatient aspirin  and atorvastatin   - Hold outpatient antihypertensives and diuretics  - Prn levophed  gtt to maintain map 65 or higher  - Troponin pending   #Acute hypoxic hypercapnic respiratory failure suspect secondary to OHS due to morbid obesity  #Mechanical intubation  - Full vent support for now: vent settings reviewed and established  - Continue lung protective strategies  - Maintain plateau pressures less than 30 cm H2O - VAP bundle implemented  - IV and nebulized steroids  - Prn bronchodilator therapy  - Intermittent CXR and ABG's - SBT once all parameters met   #Acute kidney injury likely secondary to ATN #Hyponatremia  - Trend BMP and lactic acid  - Replace electrolytes as indicated  - Strict I&O's - Avoid nephrotoxic agents as able   #Sepsis secondary to severe right foot infection/osteomyelitis with possible gas gangrene of the first MPJ - Trend WBC and monitor fever curve  - Trend PCT - Follow cultures  - Podiatry consulted and felt due to severity of infection recommended proximal limb amputation for source control Vascular Surgery consulted for RLE amputation  - ID consulted appreciate input: abx per recommendations  #Iron deficiency anemia  - Trend CBC  - Monitor for s/sx of bleeding  - Transfuse for hgb <7 - Continue iron supplementation   #Hypoglycemia~improving  #Type II diabetes mellitus  Hemoglobin A1c 10/27/23: 8.9 - CBG's q1hr for now  - Follow hypo/hyperglycemic protocol  - Diabetes coordinator consulted appreciate input - Hold  subcutaneous insulin  for now until CBG's are consistently over 180 mg/dl  - Target CBG range 782 to 180   Palliative care consulted to assist with goals of treatment.  Pt is High Risk For Cardiac Arrest and Sudden Death.  Recommend changing code status to DNR Best Practice (right click and "Reselect all SmartList Selections" daily)   Diet/type: NPO DVT prophylaxis prophylactic heparin   Pressure ulcer(s): N/A GI prophylaxis: H2B Lines: N/A Foley:  Yes, and it is still needed Code Status:  full code Last date of multidisciplinary goals of care discussion [10/27/2023]   Labs   CBC: Recent Labs  Lab 10/26/23 1817 10/27/23 0459 10/28/23 0316  WBC 11.3* 15.6* 8.2  NEUTROABS 8.7*  --   --   HGB 10.9* 11.1* 9.7*  HCT 36.2* 37.4* 32.7*  MCV 96.5 98.7 96.5  PLT 238 298 269    Basic Metabolic Panel: Recent Labs  Lab 10/26/23 1817 10/27/23 0459 10/28/23 0316  NA 131* 132* 135  K 4.0 4.9 4.4  CL 94* 97* 99  CO2 24 28 26   GLUCOSE 159* 134* 104*  BUN 32* 34* 41*  CREATININE 1.20 1.30* 1.21  CALCIUM  8.3* 8.0* 8.1*  MG  --  2.2 2.5*  PHOS  --  7.3* 6.4*   GFR: Estimated Creatinine  Clearance: 113.3 mL/min (by C-G formula based on SCr of 1.21 mg/dL). Recent Labs  Lab 10/26/23 1817 10/26/23 2040 10/26/23 2331 10/27/23 0146 10/27/23 0459 10/27/23 1900 10/27/23 2107 10/28/23 0316  WBC 11.3*  --   --   --  15.6*  --   --  8.2  LATICACIDVEN 2.3*   < > 1.8 1.0  --  0.7 1.0  --    < > = values in this interval not displayed.    Liver Function Tests: Recent Labs  Lab 10/26/23 1817  AST 21  ALT 18  ALKPHOS 63  BILITOT 0.7  PROT 7.7  ALBUMIN 3.0*   No results for input(s): "LIPASE", "AMYLASE" in the last 168 hours. No results for input(s): "AMMONIA" in the last 168 hours.  ABG    Component Value Date/Time   PHART 7.35 10/28/2023 0154   PCO2ART 58 (H) 10/28/2023 0154   PO2ART 168 (H) 10/28/2023 0154   HCO3 32.0 (H) 10/28/2023 0154   ACIDBASEDEF 8.7 (H)  02/19/2018 1533   O2SAT 99.6 10/28/2023 0154     Coagulation Profile: Recent Labs  Lab 10/26/23 2136  INR 1.2    Cardiac Enzymes: No results for input(s): "CKTOTAL", "CKMB", "CKMBINDEX", "TROPONINI" in the last 168 hours.  HbA1C: Hemoglobin A1C  Date/Time Value Ref Range Status  03/11/2014 06:20 AM 12.3 (H) 4.2 - 6.3 % Final    Comment:    The American Diabetes Association recommends that a primary goal of therapy should be <7% and that physicians should reevaluate the treatment regimen in patients with HbA1c values consistently >8%.    Hgb A1c MFr Bld  Date/Time Value Ref Range Status  10/27/2023 01:02 PM 8.9 (H) 4.8 - 5.6 % Final    Comment:    (NOTE) Pre diabetes:          5.7%-6.4%  Diabetes:              >6.4%  Glycemic control for   <7.0% adults with diabetes   05/31/2021 07:43 AM 6.3 (H) 4.8 - 5.6 % Final    Comment:    (NOTE)         Prediabetes: 5.7 - 6.4         Diabetes: >6.4         Glycemic control for adults with diabetes: <7.0     CBG: Recent Labs  Lab 10/28/23 1223 10/28/23 1342 10/28/23 1452 10/28/23 1526 10/28/23 1630  GLUCAP 69* 74 65* 114* 222*    Review of Systems:   Unable to assess pt mechanically intubated   Past Medical History:  He,  has a past medical history of Diabetes mellitus without complication (HCC), Hypercholesteremia, Hypertension, and Pancreatitis.   Surgical History:   Past Surgical History:  Procedure Laterality Date   ANKLE SURGERY Left 2001,2003   BACK SURGERY  [phone]     Social History:   reports that he has quit smoking. His smoking use included cigarettes. His smokeless tobacco use includes chew. He reports that he does not drink alcohol and does not use drugs.   Family History:  His family history includes Diabetes in his mother and sister; Heart disease in his mother.   Allergies Allergies  Allergen Reactions   Morphine Other (See Comments)    headache     A HEADACHE AND SWEATING AND  LETHARGIC headache  headache, ,  A HEADACHE AND SWEATING AND LETHARGIC headache   Morphine And Codeine Other (See Comments)    headache   Other  Other (See Comments)    A HEADACHE AND SWEATING AND LETHARGIC    headache  A HEADACHE AND SWEATING AND LETHARGIC, , headache     Home Medications  Prior to Admission medications   Medication Sig Start Date End Date Taking? Authorizing Provider  acetaminophen  (TYLENOL ) 500 MG tablet Take 1,000 mg by mouth every 6 (six) hours as needed for mild pain or moderate pain.   Yes [provider]  atorvastatin  (LIPITOR) 20 MG tablet Take 1 tablet (20 mg total) by mouth daily. 06/02/21  Yes Luna Salinas, MD  busPIRone  (BUSPAR ) 15 MG tablet Take 15 mg by mouth 3 (three) times daily.   Yes [provider]  cetirizine (ZYRTEC) 10 MG tablet Take 10 mg by mouth daily.   Yes [provider]  cloNIDine  (CATAPRES ) 0.2 MG tablet Take 0.2 mg by mouth 3 (three) times daily.   Yes [provider]  DULoxetine  (CYMBALTA ) 60 MG capsule Take 60 mg by mouth daily.   Yes [provider]  HUMALOG  100 UNIT/ML injection Inject 25 Units into the skin 2 (two) times daily.   Yes [provider]  LANTUS  100 UNIT/ML injection SMARTSIG:100 Unit(s) SUB-Q Twice Daily   Yes [provider]  losartan  (COZAAR ) 100 MG tablet Take 100 mg by mouth. 04/23/22  Yes [provider]  nitroGLYCERIN (NITROSTAT) 0.4 MG SL tablet Place under the tongue. 10/02/23  Yes [provider]  pregabalin  (LYRICA ) 25 MG capsule Take 1 capsule (25 mg total) by mouth 3 (three) times daily. 07/05/21 10/19/24 Yes Heather Litter, MD  rOPINIRole (REQUIP) 1 MG tablet Take 1 mg by mouth at bedtime. 04/11/22  Yes [provider]  gemfibrozil  (LOPID ) 600 MG tablet Take 1 tablet (600 mg total) by mouth 2 (two) times daily before a meal. Patient not taking: Reported on 10/27/2023 01/14/17   Marquita Situ, MD  insulin  NPH  Human (NOVOLIN N) 100 UNIT/ML injection Inject 0-50 Units into the skin 2 (two) times daily. Patient not taking: Reported on 10/27/2023    [provider]  insulin  regular (NOVOLIN R) 100 units/mL injection Inject into the skin See admin instructions. Inject twice daily according to blood glucose reading Patient not taking: Reported on 10/27/2023    [provider]  losartan -hydrochlorothiazide  (HYZAAR) 100-25 MG tablet Take 1 tablet by mouth daily. Patient not taking: Reported on 10/27/2023    [provider]  Multiple Vitamin (MULTIVITAMIN WITH MINERALS) TABS tablet Take 1 tablet by mouth daily. Patient not taking: Reported on 10/20/2023 06/02/21   Amin, Sumayya, MD  naproxen sodium (ALEVE) 220 MG tablet Take 440 mg by mouth 2 (two) times daily as needed (pain). Patient not taking: Reported on 10/20/2023    [provider]  polyethylene glycol (MIRALAX  / GLYCOLAX ) packet Take 17 g by mouth daily. Patient not taking: Reported on 10/20/2023 01/15/17   Marquita Situ, MD  torsemide  (DEMADEX ) 20 MG tablet Take 1 tablet (20 mg total) by mouth daily. Patient not taking: Reported on 10/20/2023 06/01/21   Luna Salinas, MD    Critical care provider statement:   Total critical care time: 109  minutes   Performed by: Jaclynn Mast MD   Critical care time was exclusive of separately billable procedures and treating other patients.   Critical care was necessary to treat or prevent imminent or life-threatening deterioration.   Critical care was time spent personally by me on the following activities: development of treatment plan with patient and/or surrogate as well as nursing,  discussions with consultants, evaluation of patient's response to treatment, examination of patient, obtaining history from patient or surrogate, ordering and performing treatments and interventions, ordering and review of laboratory studies, ordering and review of radiographic studies, pulse  oximetry and re-evaluation of patient's condition.    Leonila Speranza, M.D.  Pulmonary & Critical Care Medicine

## 2023-10-29 ENCOUNTER — Encounter: Payer: Self-pay | Admitting: Vascular Surgery

## 2023-10-29 DIAGNOSIS — L089 Local infection of the skin and subcutaneous tissue, unspecified: Secondary | ICD-10-CM | POA: Diagnosis not present

## 2023-10-29 DIAGNOSIS — E11628 Type 2 diabetes mellitus with other skin complications: Secondary | ICD-10-CM | POA: Diagnosis not present

## 2023-10-29 DIAGNOSIS — Z7189 Other specified counseling: Secondary | ICD-10-CM | POA: Diagnosis not present

## 2023-10-29 DIAGNOSIS — Z9889 Other specified postprocedural states: Secondary | ICD-10-CM

## 2023-10-29 DIAGNOSIS — A419 Sepsis, unspecified organism: Secondary | ICD-10-CM | POA: Diagnosis not present

## 2023-10-29 DIAGNOSIS — R6520 Severe sepsis without septic shock: Secondary | ICD-10-CM | POA: Diagnosis not present

## 2023-10-29 DIAGNOSIS — Z89611 Acquired absence of right leg above knee: Secondary | ICD-10-CM

## 2023-10-29 LAB — CBC
HCT: 28.7 % — ABNORMAL LOW (ref 39.0–52.0)
Hemoglobin: 8.7 g/dL — ABNORMAL LOW (ref 13.0–17.0)
MCH: 28.9 pg (ref 26.0–34.0)
MCHC: 30.3 g/dL (ref 30.0–36.0)
MCV: 95.3 fL (ref 80.0–100.0)
Platelets: 278 10*3/uL (ref 150–400)
RBC: 3.01 MIL/uL — ABNORMAL LOW (ref 4.22–5.81)
RDW: 15.8 % — ABNORMAL HIGH (ref 11.5–15.5)
WBC: 8.6 10*3/uL (ref 4.0–10.5)
nRBC: 0 % (ref 0.0–0.2)

## 2023-10-29 LAB — BASIC METABOLIC PANEL WITH GFR
Anion gap: 7 (ref 5–15)
BUN: 32 mg/dL — ABNORMAL HIGH (ref 6–20)
CO2: 26 mmol/L (ref 22–32)
Calcium: 8 mg/dL — ABNORMAL LOW (ref 8.9–10.3)
Chloride: 101 mmol/L (ref 98–111)
Creatinine, Ser: 0.81 mg/dL (ref 0.61–1.24)
GFR, Estimated: >60 mL/min (ref >60)
Glucose, Bld: 99 mg/dL (ref 70–99)
Potassium: 4.9 mmol/L (ref 3.5–5.1)
Sodium: 134 mmol/L — ABNORMAL LOW (ref 135–145)

## 2023-10-29 LAB — GLUCOSE, CAPILLARY
Glucose-Capillary: 155 mg/dL — ABNORMAL HIGH (ref 70–99)
Glucose-Capillary: 159 mg/dL — ABNORMAL HIGH (ref 70–99)
Glucose-Capillary: 80 mg/dL (ref 70–99)
Glucose-Capillary: 90 mg/dL (ref 70–99)
Glucose-Capillary: 91 mg/dL (ref 70–99)
Glucose-Capillary: 95 mg/dL (ref 70–99)
Glucose-Capillary: 97 mg/dL (ref 70–99)
Glucose-Capillary: 98 mg/dL (ref 70–99)

## 2023-10-29 LAB — MAGNESIUM: Magnesium: 2.4 mg/dL (ref 1.7–2.4)

## 2023-10-29 LAB — T3, FREE: T3, Free: 2.8 pg/mL (ref 2.0–4.4)

## 2023-10-29 LAB — PHOSPHORUS: Phosphorus: 4.1 mg/dL (ref 2.5–4.6)

## 2023-10-29 MED ORDER — FAMOTIDINE 20 MG PO TABS
20.0000 mg | ORAL_TABLET | Freq: Every day | ORAL | Status: DC
  Administered 2023-10-29 – 2023-11-09 (×12): 20 mg via ORAL
  Filled 2023-10-29 (×12): qty 1

## 2023-10-29 MED ORDER — OXYCODONE HCL 5 MG PO TABS
5.0000 mg | ORAL_TABLET | ORAL | Status: DC | PRN
  Administered 2023-10-30: 5 mg via ORAL
  Filled 2023-10-29: qty 1

## 2023-10-29 MED ORDER — PREGABALIN 75 MG PO CAPS
150.0000 mg | ORAL_CAPSULE | Freq: Two times a day (BID) | ORAL | Status: DC
  Administered 2023-10-29 – 2023-11-04 (×13): 150 mg via ORAL
  Filled 2023-10-29 (×13): qty 2

## 2023-10-29 MED ORDER — HYDROMORPHONE HCL 1 MG/ML IJ SOLN
1.0000 mg | INTRAMUSCULAR | Status: DC | PRN
  Administered 2023-10-29 – 2023-10-30 (×9): 1 mg via INTRAVENOUS
  Filled 2023-10-29 (×9): qty 1

## 2023-10-29 MED ORDER — OXYCODONE HCL 5 MG PO TABS
10.0000 mg | ORAL_TABLET | ORAL | Status: DC | PRN
  Administered 2023-10-29 – 2023-10-30 (×6): 10 mg via ORAL
  Filled 2023-10-29 (×6): qty 2

## 2023-10-29 MED ORDER — ORAL CARE MOUTH RINSE: 15.0000 mL | OROMUCOSAL | Status: DC | PRN

## 2023-10-29 MED ORDER — ACETAMINOPHEN 500 MG PO TABS
500.0000 mg | ORAL_TABLET | Freq: Four times a day (QID) | ORAL | Status: DC
  Administered 2023-10-29 – 2023-11-09 (×43): 500 mg via ORAL
  Filled 2023-10-29 (×43): qty 1

## 2023-10-29 MED ORDER — ATORVASTATIN CALCIUM 20 MG PO TABS
20.0000 mg | ORAL_TABLET | Freq: Every day | ORAL | Status: DC
  Administered 2023-10-29 – 2023-11-09 (×12): 20 mg via ORAL
  Filled 2023-10-29 (×12): qty 1

## 2023-10-29 MED ORDER — DOCUSATE SODIUM 100 MG PO CAPS
100.0000 mg | ORAL_CAPSULE | Freq: Two times a day (BID) | ORAL | Status: DC
  Administered 2023-10-29 – 2023-11-08 (×18): 100 mg via ORAL
  Filled 2023-10-29 (×18): qty 1

## 2023-10-29 MED ORDER — HYDROMORPHONE HCL 1 MG/ML IJ SOLN
0.5000 mg | INTRAMUSCULAR | Status: DC | PRN
  Administered 2023-10-29: 1 mg via INTRAVENOUS
  Filled 2023-10-29: qty 1

## 2023-10-29 MED ORDER — ACETAMINOPHEN 10 MG/ML IV SOLN
1000.0000 mg | Freq: Once | INTRAVENOUS | Status: AC
  Administered 2023-10-29: 1000 mg via INTRAVENOUS
  Filled 2023-10-29: qty 100

## 2023-10-29 MED ORDER — POLYETHYLENE GLYCOL 3350 17 G PO PACK
17.0000 g | PACK | Freq: Every day | ORAL | Status: DC
  Administered 2023-10-29 – 2023-11-08 (×9): 17 g via ORAL
  Filled 2023-10-29 (×10): qty 1

## 2023-10-29 MED ORDER — JUVEN PO PACK
1.0000 | PACK | Freq: Two times a day (BID) | ORAL | Status: DC
  Administered 2023-10-29 – 2023-11-08 (×13): 1 via ORAL

## 2023-10-29 MED ORDER — VANCOMYCIN HCL 1500 MG/300ML IV SOLN
1500.0000 mg | Freq: Two times a day (BID) | INTRAVENOUS | Status: AC
  Administered 2023-10-29 – 2023-11-01 (×7): 1500 mg via INTRAVENOUS
  Filled 2023-10-29 (×7): qty 300

## 2023-10-29 MED ORDER — ENSURE MAX PROTEIN PO LIQD
11.0000 [oz_av] | Freq: Every day | ORAL | Status: DC
  Administered 2023-10-29 – 2023-11-03 (×4): 11 [oz_av] via ORAL
  Filled 2023-10-29: qty 330

## 2023-10-29 MED ORDER — POLYSACCHARIDE IRON COMPLEX 150 MG PO CAPS
150.0000 mg | ORAL_CAPSULE | Freq: Every day | ORAL | Status: DC
  Administered 2023-10-29 – 2023-11-09 (×12): 150 mg via ORAL
  Filled 2023-10-29 (×12): qty 1

## 2023-10-29 MED ORDER — HYDROMORPHONE HCL 1 MG/ML IJ SOLN
1.0000 mg | Freq: Once | INTRAMUSCULAR | Status: AC
  Administered 2023-10-29: 1 mg via INTRAVENOUS

## 2023-10-29 MED ORDER — HYDROMORPHONE HCL 1 MG/ML IJ SOLN
0.5000 mg | INTRAMUSCULAR | Status: DC | PRN
  Filled 2023-10-29: qty 1

## 2023-10-29 MED ORDER — ZINC SULFATE 220 (50 ZN) MG PO CAPS
220.0000 mg | ORAL_CAPSULE | Freq: Every day | ORAL | Status: DC
  Administered 2023-10-29 – 2023-11-09 (×12): 220 mg via ORAL
  Filled 2023-10-29 (×12): qty 1

## 2023-10-29 MED ORDER — KETOROLAC TROMETHAMINE 30 MG/ML IJ SOLN
30.0000 mg | Freq: Once | INTRAMUSCULAR | Status: DC
  Filled 2023-10-29: qty 1

## 2023-10-29 MED ORDER — ASPIRIN 81 MG PO CHEW
81.0000 mg | CHEWABLE_TABLET | Freq: Every day | ORAL | Status: DC
  Administered 2023-10-29 – 2023-11-09 (×12): 81 mg via ORAL
  Filled 2023-10-29 (×12): qty 1

## 2023-10-29 MED ORDER — SODIUM CHLORIDE 0.9 % IV SOLN
3.0000 g | Freq: Four times a day (QID) | INTRAVENOUS | Status: AC
  Administered 2023-10-29 – 2023-11-01 (×13): 3 g via INTRAVENOUS
  Filled 2023-10-29 (×15): qty 8

## 2023-10-29 MED ORDER — METHOCARBAMOL 1000 MG/10ML IJ SOLN
500.0000 mg | Freq: Four times a day (QID) | INTRAMUSCULAR | Status: DC
  Administered 2023-10-29 – 2023-11-03 (×19): 500 mg via INTRAVENOUS
  Filled 2023-10-29: qty 5
  Filled 2023-10-29: qty 10
  Filled 2023-10-29 (×3): qty 5
  Filled 2023-10-29: qty 10
  Filled 2023-10-29 (×6): qty 5
  Filled 2023-10-29: qty 10
  Filled 2023-10-29 (×7): qty 5
  Filled 2023-10-29: qty 10

## 2023-10-29 MED ORDER — ADULT MULTIVITAMIN W/MINERALS CH
1.0000 | ORAL_TABLET | Freq: Every day | ORAL | Status: DC
  Administered 2023-10-29 – 2023-11-09 (×12): 1 via ORAL
  Filled 2023-10-29 (×12): qty 1

## 2023-10-29 MED ORDER — VITAMIN C 500 MG PO TABS
500.0000 mg | ORAL_TABLET | Freq: Two times a day (BID) | ORAL | Status: DC
  Administered 2023-10-29 – 2023-11-09 (×21): 500 mg via ORAL
  Filled 2023-10-29 (×22): qty 1

## 2023-10-29 NOTE — Plan of Care (Signed)
  Problem: Education: Goal: Ability to describe self-care measures that may prevent or decrease complications (Diabetes Survival Skills Education) will improve Outcome: Progressing Goal: Individualized Educational Video(s) Outcome: Progressing   Problem: Coping: Goal: Ability to adjust to condition or change in health will improve Outcome: Progressing   Problem: Fluid Volume: Goal: Ability to maintain a balanced intake and output will improve Outcome: Progressing   Problem: Health Behavior/Discharge Planning: Goal: Ability to identify and utilize available resources and services will improve Outcome: Progressing Goal: Ability to manage health-related needs will improve Outcome: Progressing   Problem: Metabolic: Goal: Ability to maintain appropriate glucose levels will improve Outcome: Progressing   Problem: Nutritional: Goal: Maintenance of adequate nutrition will improve Outcome: Progressing Goal: Progress toward achieving an optimal weight will improve Outcome: Progressing   Problem: Skin Integrity: Goal: Risk for impaired skin integrity will decrease Outcome: Progressing   Problem: Tissue Perfusion: Goal: Adequacy of tissue perfusion will improve Outcome: Progressing   Problem: Clinical Measurements: Goal: Ability to avoid or minimize complications of infection will improve Outcome: Progressing   Problem: Skin Integrity: Goal: Skin integrity will improve Outcome: Progressing   Problem: Education: Goal: Knowledge of General Education information will improve Description: Including pain rating scale, medication(s)/side effects and non-pharmacologic comfort measures Outcome: Progressing   Problem: Health Behavior/Discharge Planning: Goal: Ability to manage health-related needs will improve Outcome: Progressing   Problem: Clinical Measurements: Goal: Ability to maintain clinical measurements within normal limits will improve Outcome: Progressing Goal: Will  remain free from infection Outcome: Progressing Goal: Diagnostic test results will improve Outcome: Progressing Goal: Respiratory complications will improve Outcome: Progressing Goal: Cardiovascular complication will be avoided Outcome: Progressing   Problem: Activity: Goal: Risk for activity intolerance will decrease Outcome: Progressing   Problem: Nutrition: Goal: Adequate nutrition will be maintained Outcome: Progressing   Problem: Coping: Goal: Level of anxiety will decrease Outcome: Progressing   Problem: Elimination: Goal: Will not experience complications related to bowel motility Outcome: Progressing Goal: Will not experience complications related to urinary retention Outcome: Progressing   Problem: Pain Managment: Goal: General experience of comfort will improve and/or be controlled Outcome: Progressing   Problem: Safety: Goal: Ability to remain free from injury will improve Outcome: Progressing   Problem: Skin Integrity: Goal: Risk for impaired skin integrity will decrease Outcome: Progressing

## 2023-10-29 NOTE — Progress Notes (Signed)
 Pharmacy Antibiotic Note  Martin Riddle is a 53 y.o. male admitted on 10/26/2023 with wound infection, osteomyelitis.  Pharmacy has been consulted for Vancomycin  dosing.  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  ID is following  Today, 10/29/2023 Day #3 antibiotics- Vancomycin  and piperacillin /tazobactam  Renal: AKI at admit resolved, SCr WNL currently WBC WNL Afebrile 5/19 Blood cx NGTD 5/21 R AKA performed  Plan: Based on limb loss secondary to AKA (decreased volume), adjust vancomycin  to 1500mg  IV q12h Ask for weight to confirm the estimated new weight used to calculate above dose Goal AUC 400-600 Calculated AUC 500, Cmin 13 using Vd 0.5 L/kg Monitor renal function,  If vancomycin  to continue >5-7 days will check trough (do not currently anticipate a prolonged course to need peak and trough)  Change piperacillin /tazobactam to ampicillin /sulbactam 3gm IV q6h Per ID completing antibiotics for possible LEFT foot infection F/u LOT as per ID plan  Height: 6\' 5"  (195.6 cm) Weight: (!) 150.2 kg (331 lb 2.1 oz) IBW/kg (Calculated) : 89.1  Temp (24hrs), Avg:98.4 F (36.9 C), Min:98.2 F (36.8 C), Max:98.8 F (37.1 C)  Recent Labs  Lab 10/26/23 1817 10/26/23 2040 10/26/23 2331 10/27/23 0146 10/27/23 0459 10/27/23 1900 10/27/23 2107 10/28/23 0316 10/29/23 0331  WBC 11.3*  --   --   --  15.6*  --   --  8.2 8.6  CREATININE 1.20  --   --   --  1.30*  --   --  1.21 0.81  LATICACIDVEN 2.3* 1.6 1.8 1.0  --  0.7 1.0  --   --     Estimated Creatinine Clearance: 169.3 mL/min (by C-G formula based on SCr of 0.81 mg/dL).    Allergies  Allergen Reactions   Morphine Other (See Comments)    headache     A HEADACHE AND SWEATING AND LETHARGIC headache  headache, ,  A HEADACHE AND SWEATING AND LETHARGIC headache   Morphine And Codeine Other (See Comments)    headache   Other Other (See Comments)    A HEADACHE AND SWEATING AND LETHARGIC    headache  A HEADACHE AND  SWEATING AND LETHARGIC, , headache    Thank you for allowing pharmacy to be a part of this patient's care.  Armella Stogner, PharmD, BCPS, BCIDP Work Cell: [phone] 10/29/2023 4:45 PM

## 2023-10-29 NOTE — Progress Notes (Addendum)
 Daily Progress Note   Patient Name: Martin Riddle       Date: 10/29/2023 DOB: December 06, 1970  Age: 53 y.o. MRN#: [id number] Attending Physician: Erskin Hearing, MD Primary Care Physician: Thomasene Flemings, NP Admit Date: 10/26/2023  Reason for Consultation/Follow-up: Establishing goals of care  Subjective: Notes and lab was reviewed.  In to see patient.  He is currently resting in bed and has been extubated.  POD # 1 from AKA.  He complains of pain in the stump, and appears uncomfortable.    He discusses frustrations regarding his family's situation.  VS in to speak with patient to assess and discuss care moving forward.  PMT will continue to follow.  Given patient's pain, orders were placed to assist with patient's pain management.  Case discussed with CCM NP.  Length of Stay: 3  Current Medications: Scheduled Meds:   sodium chloride    Intravenous Once   acetaminophen   500 mg Oral Q6H   arformoterol  15 mcg Nebulization BID   ascorbic acid  500 mg Oral BID   aspirin   81 mg Oral Daily   atorvastatin   20 mg Oral Daily   budesonide  (PULMICORT ) nebulizer solution  0.25 mg Nebulization BID   Chlorhexidine  Gluconate Cloth  6 each Topical Daily   cyanocobalamin  1,000 mcg Intramuscular Q1200   Followed by   Cecily Cohen ON 11/03/2023] vitamin B-12  1,000 mcg Oral Daily   docusate sodium   100 mg Oral BID   famotidine   20 mg Oral Daily   heparin   5,000 Units Subcutaneous Q8H   iron polysaccharides  150 mg Oral Daily   methocarbamol  (ROBAXIN ) injection  500 mg Intravenous Q6H   methylPREDNISolone  (SOLU-MEDROL ) injection  125 mg Intravenous Once   multivitamin with minerals  1 tablet Oral Daily   nutrition supplement (JUVEN)  1 packet Oral BID BM   mouth rinse  15 mL Mouth Rinse Q2H    polyethylene glycol  17 g Oral Daily   pregabalin   150 mg Oral BID   Ensure Max Protein  11 oz Oral QHS   zinc  sulfate (50mg  elemental zinc )  220 mg Oral Daily    Continuous Infusions:  dextrose  50 mL/hr at 10/28/23 1900   piperacillin -tazobactam (ZOSYN )  IV 3.375 g (10/29/23 1132)   vancomycin  1,750 mg (10/29/23 1141)    PRN  Meds: dextrose , HYDROmorphone  (DILAUDID ) injection, ipratropium-albuterol , ondansetron  (ZOFRAN ) IV, mouth rinse, mouth rinse, oxyCODONE , oxyCODONE   Physical Exam Pulmonary:     Effort: Pulmonary effort is normal.  Skin:    General: Skin is warm and dry.  Neurological:     Mental Status: He is alert.             Vital Signs: BP (!) 122/55   Pulse 65   Temp 98.8 F (37.1 C) (Axillary)   Resp (!) 24   Ht 6\' 5"  (1.956 m)   Wt (!) 150.2 kg   SpO2 99%   BMI 39.27 kg/m  SpO2: SpO2: 99 % O2 Device: O2 Device: Ventilator O2 Flow Rate: O2 Flow Rate (L/min): 6 L/min  Intake/output summary:  Intake/Output Summary (Last 24 hours) at 10/29/2023 1243 Last data filed at 10/29/2023 0901 Gross per 24 hour  Intake 2729.69 ml  Output 4150 ml  Net -1420.31 ml   LBM: Last BM Date :  (Unknown.) Baseline Weight: Weight: (!) 181.4 kg Most recent weight: Weight: (!) 150.2 kg   Patient Active Problem List   Diagnosis Date Noted   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   Diabetic foot infection (HCC) 10/26/2023   HTN (hypertension) 10/26/2023   HLD (hyperlipidemia) 10/26/2023   Type 2 diabetes mellitus with peripheral neuropathy (HCC) 10/26/2023   Obesity, Class III, BMI 40-49.9 (morbid obesity) 10/26/2023   Severe sepsis (HCC) 10/26/2023   Depression 10/26/2023   Osteomyelitis of right foot (HCC) 10/26/2023   Hypoglycemia 10/26/2023   Atherosclerosis of native arteries of the extremities with ulceration (HCC) 10/20/2023   Hypertension    Failure to attend appointment 07/07/2022   Cervical radiculopathy 01/07/2022   Lumbar radiculopathy 01/07/2022   Pain syndrome,  chronic 01/07/2022   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   History of lumbar spinal fusion 01/07/2022   Chronic venous stasis dermatitis of both lower extremities    Cellulitis 05/30/2021   Multiple open wounds of lower extremity, initial encounter 05/30/2021   Respiratory failure (HCC) 02/19/2018   Severe recurrent major depression with psychotic features (HCC) 01/09/2017   Congenital flat foot 01/16/2015   DDD (degenerative disc disease), lumbar 01/16/2015   Diabetes mellitus, type 2 (HCC) 01/16/2015   Hypercholesteremia 01/16/2015   Gastro-esophageal reflux disease without esophagitis 01/16/2015   Peripheral neuropathy 01/16/2015    Palliative Care Assessment & Plan     Recommendations/Plan: Oxycodone  in place for moderate to severe pain, Dilaudid  placed for breakthrough pain or if patient is unable to take oral medication. Robaxin  placed for muscle spasms. Lyrica  has patient is on this medication for baseline neuropathic pain.  Continue current care.  Code Status:    Code Status Orders  (From admission, onward)           Start     Ordered   10/26/23 2115  Full code  Continuous       Question:  By:  Answer:  Consent: discussion documented in EHR   10/26/23 2115           Code Status History     Date Active Date Inactive Code Status Order ID Comments User Context   05/30/2021 2008 06/01/2021 2059 Full Code [id number]  [place], Eric J, DO ED   01/10/2017 1131 01/15/2017 1433 Full Code [id number]  Evone Hoh, MD Inpatient   12/09/2015 1550 12/11/2015 1535 Full Code [id number]  [place] Carrel, MD Inpatient   01/27/2015 2007 01/30/2015 1345 Full Code [id number]  [place] Carrel, MD Inpatient  Care plan was discussed with CCM NP  Thank you for allowing the Palliative Medicine Team to assist in the care of this patient.   Meribeth Standard, NP  Please contact Palliative Medicine Team phone at [phone] for questions and concerns.

## 2023-10-29 NOTE — Progress Notes (Signed)
 NAME:  Charles Andringa, MRN:  [id number], DOB:  10-12-1970, LOS: 3 ADMISSION DATE:  10/26/2023, CONSULTATION DATE: 10/27/2023 REFERRING MD: Dr. Hubert Madden, CHIEF COMPLAINT: Right Foot Wound   History of Present Illness:  This is a 53 yo male who presented to [facility] ER on 05/19 with a wound to his right foot resulting in worsening pain.  Per ER notes pts wife reported the wound initially started off as a blister that popped and has gradually worsened.  He was evaluated by vascular surgery in the outpatient setting on 05/13 with nonhealing ulcerations.  Vascular Surgery recommended angiography with possible revascularization during that office visit     ED Course Upon arrival to the ER significant lab results were: Na+ 131/chloride 94/glucose 159/BUN 32/albumin 3.0/lactic acid 2.3/wbc 11.3/hgb 10.9.  Right foot x-ray revealed erosion of the distal phalanx concerning for acute on chronic osteomyelitis as well as osteomyelitis of the proximal phalanx and soft tissue emphysema about the first MPJ concerning for gas gangrene.  Vital signs were stable.  Pt started on vancomycin  and zosyn .  Pt subsequently admitted to the medsurg unit per hospitalist team for additional workup and treatment.  However, he remained in the ER pending bed availability.  See detailed hospital course below under significant events.   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  He was  initially IVC'd by psychiatry due to concern of inability to make sound medical decisions for himself.  Pts mentation improved and he was transitioned to nasal canula with improvement in CBG's.  Psych rescinded the IVC and pt deemed competent to make medical decisions.  Unfortunately, later during the shift pt became unresponsive and hypoxic requiring mechanical intubation 10/28/23- patient scheduled for amputation today due to critical limb ischemia with gangrene of bilateral lower extermities. He remains on MV with sedation.  10/29/23- s/p right amputation.  Blood glucose stable. For bedside SLP today which he passed and diet has been initiated. He is liberated off ventilator.    Objective    Blood pressure (!) 122/55, pulse 65, temperature 98.3 F (36.8 C), temperature source Axillary, resp. rate (!) 24, height 6\' 5"  (1.956 m), weight (!) 150.2 kg, SpO2 96%.    Vent Mode: PRVC FiO2 (%):  [30 %] 30 % Set Rate:  [24 bmp] 24 bmp Vt Set:  [500 mL] 500 mL PEEP:  [8 cmH20-10 cmH20] 8 cmH20 Plateau Pressure:  [23 cmH20] 23 cmH20   Intake/Output Summary (Last 24 hours) at 10/29/2023 0827 Last data filed at 10/29/2023 0600 Gross per 24 hour  Intake 2859.21 ml  Output 3725 ml  Net -865.79 ml   Filed Weights   10/26/23 1804 10/27/23 1247  Weight: (!) 181.4 kg (!) 150.2 kg    Examination: General: Acute on chronically-ill appearing male, NAD HENT: Difficult to assess for JVD due to body habitus Lungs: Diminished throughout, even, non labored  Cardiovascular: Sinus rhythm with depressed T wave, no m/r/g, 2+ radial/1+ distal pulses via doppler, 2+ bilateral lower  extremity edema  Abdomen: +BS x4, obese, soft, non distended  Extremities/Skin: see images below  Right foot   Left foot   Left foot   Neuro: Sedated/paralyzed, not following commands, PERRL  GU: Pending indwelling catheter placement   Resolved problem list   Assessment and Plan   #Acute toxic metabolic encephalopathy   #Mechanical intubation pain/discomfort  - - RESOLVED  #Hypotension: sedation related and septic shock- RESOLVED #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  Hx: Hypercholesteremia and HTN  - Continuous telemetry monitoring  - Continue outpatient aspirin  and atorvastatin   - Hold outpatient antihypertensives and diuretics  - Prn levophed  gtt to maintain map 65 or higher  - Troponin pending   #Acute hypoxic hypercapnic respiratory failure suspect secondary to OHS due to morbid obesity - RESOLVED  #Mechanical intubation  - Full vent support for now: vent settings reviewed and established  - Continue lung protective strategies  - Maintain plateau pressures less than 30 cm H2O - VAP bundle implemented  - IV and nebulized steroids  - Prn bronchodilator therapy  - Intermittent CXR and ABG's - SBT once all parameters met   #Acute kidney injury likely secondary to ATN- RESOLVED  #Hyponatremia  - Trend BMP and lactic acid  - Replace electrolytes as indicated  - Strict I&O's - Avoid nephrotoxic agents as able   #Sepsis secondary to severe right foot infection/osteomyelitis with possible gas gangrene of the first MPJ - IMPROVED POST AMPUTATION - ID on case plan for IV abx for 24h  #Iron deficiency anemia  - Trend CBC  - Monitor for s/sx of bleeding  - Transfuse for hgb <7 - Continue iron supplementation   #Hypoglycemia~improving  #Type II diabetes mellitus  Hemoglobin A1c 10/27/23: 8.9 - CBG's q1hr for now  - Follow hypo/hyperglycemic protocol  - Diabetes coordinator consulted appreciate input - Hold subcutaneous insulin  for now until CBG's are consistently over 180 mg/dl  - Target CBG range 540 to 180   Best Practice (right click and "Reselect all SmartList Selections" daily)   Diet/type: NPO DVT prophylaxis prophylactic heparin   Pressure ulcer(s): N/A GI prophylaxis: H2B Lines: N/A Foley:  Yes, and it is still needed Code  Status:  full code Last date of multidisciplinary goals of care discussion [10/27/2023]   Labs   CBC: Recent Labs  Lab 10/26/23 1817 10/27/23 0459 10/28/23 0316 10/29/23 0331  WBC 11.3* 15.6* 8.2 8.6  NEUTROABS 8.7*  --   --   --   HGB 10.9* 11.1* 9.7* 8.7*  HCT 36.2* 37.4* 32.7* 28.7*  MCV 96.5 98.7 96.5 95.3  PLT 238 298 269 278    Basic Metabolic Panel: Recent Labs  Lab 10/26/23 1817 10/27/23 0459 10/28/23 0316 10/29/23 0331  NA 131* 132* 135 134*  K 4.0 4.9 4.4 4.9  CL 94* 97* 99 101  CO2 24 28 26 26   GLUCOSE 159* 134* 104* 99  BUN 32* 34* 41* 32*  CREATININE 1.20 1.30* 1.21 0.81  CALCIUM  8.3* 8.0* 8.1* 8.0*  MG  --  2.2 2.5* 2.4  PHOS  --  7.3* 6.4* 4.1   GFR: Estimated Creatinine Clearance: 169.3 mL/min (by C-G formula based on SCr of 0.81 mg/dL). Recent Labs  Lab 10/26/23 1817 10/26/23 2040 10/26/23 2331 10/27/23 0146 10/27/23 0459 10/27/23 1900 10/27/23 2107 10/28/23 0316 10/29/23 0331  WBC 11.3*  --   --   --  15.6*  --   --  8.2 8.6  LATICACIDVEN 2.3*   < >  1.8 1.0  --  0.7 1.0  --   --    < > = values in this interval not displayed.    Liver Function Tests: Recent Labs  Lab 10/26/23 1817  AST 21  ALT 18  ALKPHOS 63  BILITOT 0.7  PROT 7.7  ALBUMIN 3.0*   No results for input(s): "LIPASE", "AMYLASE" in the last 168 hours. No results for input(s): "AMMONIA" in the last 168 hours.  ABG    Component Value Date/Time   PHART 7.35 10/28/2023 0154   PCO2ART 58 (H) 10/28/2023 0154   PO2ART 168 (H) 10/28/2023 0154   HCO3 32.0 (H) 10/28/2023 0154   ACIDBASEDEF 8.7 (H) 02/19/2018 1533   O2SAT 99.6 10/28/2023 0154     Coagulation Profile: Recent Labs  Lab 10/26/23 2136  INR 1.2    Cardiac Enzymes: No results for input(s): "CKTOTAL", "CKMB", "CKMBINDEX", "TROPONINI" in the last 168 hours.  HbA1C: Hemoglobin A1C  Date/Time Value Ref Range Status  03/11/2014 06:20 AM 12.3 (H) 4.2 - 6.3 % Final    Comment:    The American  Diabetes Association recommends that a primary goal of therapy should be <7% and that physicians should reevaluate the treatment regimen in patients with HbA1c values consistently >8%.    Hgb A1c MFr Bld  Date/Time Value Ref Range Status  10/27/2023 01:02 PM 8.9 (H) 4.8 - 5.6 % Final    Comment:    (NOTE) Pre diabetes:          5.7%-6.4%  Diabetes:              >6.4%  Glycemic control for   <7.0% adults with diabetes   05/31/2021 07:43 AM 6.3 (H) 4.8 - 5.6 % Final    Comment:    (NOTE)         Prediabetes: 5.7 - 6.4         Diabetes: >6.4         Glycemic control for adults with diabetes: <7.0     CBG: Recent Labs  Lab 10/28/23 2324 10/29/23 0022 10/29/23 0202 10/29/23 0304 10/29/23 0720  GLUCAP 30* 97 90 95 98    Review of Systems:   Unable to assess pt mechanically intubated   Past Medical History:  He,  has a past medical history of Diabetes mellitus without complication (HCC), Hypercholesteremia, Hypertension, and Pancreatitis.   Surgical History:   Past Surgical History:  Procedure Laterality Date   ANKLE SURGERY Left 2001,2003   BACK SURGERY  [phone]     Social History:   reports that he has quit smoking. His smoking use included cigarettes. His smokeless tobacco use includes chew. He reports that he does not drink alcohol and does not use drugs.   Family History:  His family history includes Diabetes in his mother and sister; Heart disease in his mother.   Allergies Allergies  Allergen Reactions   Morphine Other (See Comments)    headache     A HEADACHE AND SWEATING AND LETHARGIC headache  headache, ,  A HEADACHE AND SWEATING AND LETHARGIC headache   Morphine And Codeine Other (See Comments)    headache   Other Other (See Comments)    A HEADACHE AND SWEATING AND LETHARGIC    headache  A HEADACHE AND SWEATING AND LETHARGIC, , headache     Home Medications  Prior to Admission medications   Medication Sig Start Date End Date Taking?  Authorizing Provider  acetaminophen  (TYLENOL ) 500 MG tablet Take 1,000 mg  by mouth every 6 (six) hours as needed for mild pain or moderate pain.   Yes [provider]  atorvastatin  (LIPITOR) 20 MG tablet Take 1 tablet (20 mg total) by mouth daily. 06/02/21  Yes Luna Salinas, MD  busPIRone  (BUSPAR ) 15 MG tablet Take 15 mg by mouth 3 (three) times daily.   Yes [provider]  cetirizine (ZYRTEC) 10 MG tablet Take 10 mg by mouth daily.   Yes [provider]  cloNIDine  (CATAPRES ) 0.2 MG tablet Take 0.2 mg by mouth 3 (three) times daily.   Yes [provider]  DULoxetine  (CYMBALTA ) 60 MG capsule Take 60 mg by mouth daily.   Yes [provider]  HUMALOG  100 UNIT/ML injection Inject 25 Units into the skin 2 (two) times daily.   Yes [provider]  LANTUS  100 UNIT/ML injection SMARTSIG:100 Unit(s) SUB-Q Twice Daily   Yes [provider]  losartan  (COZAAR ) 100 MG tablet Take 100 mg by mouth. 04/23/22  Yes [provider]  nitroGLYCERIN (NITROSTAT) 0.4 MG SL tablet Place under the tongue. 10/02/23  Yes [provider]  pregabalin  (LYRICA ) 25 MG capsule Take 1 capsule (25 mg total) by mouth 3 (three) times daily. 07/05/21 10/19/24 Yes Heather Litter, MD  rOPINIRole (REQUIP) 1 MG tablet Take 1 mg by mouth at bedtime. 04/11/22  Yes [provider]  gemfibrozil  (LOPID ) 600 MG tablet Take 1 tablet (600 mg total) by mouth 2 (two) times daily before a meal. Patient not taking: Reported on 10/27/2023 01/14/17   Marquita Situ, MD  insulin  NPH Human (NOVOLIN N) 100 UNIT/ML injection Inject 0-50 Units into the skin 2 (two) times daily. Patient not taking: Reported on 10/27/2023    [provider]  insulin  regular (NOVOLIN R) 100 units/mL injection Inject into the skin See admin instructions. Inject twice daily according to blood glucose reading Patient not taking: Reported on 10/27/2023    [provider]  losartan -hydrochlorothiazide  (HYZAAR) 100-25 MG tablet Take 1 tablet by mouth daily. Patient not taking: Reported on 10/27/2023    [provider]  Multiple Vitamin (MULTIVITAMIN WITH MINERALS) TABS tablet Take 1 tablet by mouth daily. Patient not taking: Reported on 10/20/2023 06/02/21   Amin, Sumayya, MD  naproxen sodium (ALEVE) 220 MG tablet Take 440 mg by mouth 2 (two) times daily as needed (pain). Patient not taking: Reported on 10/20/2023    [provider]  polyethylene glycol (MIRALAX  / GLYCOLAX ) packet Take 17 g by mouth daily. Patient not taking: Reported on 10/20/2023 01/15/17   Marquita Situ, MD  torsemide  (DEMADEX ) 20 MG tablet Take 1 tablet (20 mg total) by mouth daily. Patient not taking: Reported on 10/20/2023 06/01/21   Luna Salinas, MD    Critical care provider statement:   Total critical care time: 34  minutes   Performed by: Jaclynn Mast MD   Critical care time was exclusive of separately billable procedures and treating other patients.   Critical care was necessary to treat or prevent imminent or life-threatening deterioration.   Critical care was time spent personally by me on the following activities: development of treatment plan with patient and/or surrogate as well as nursing, discussions with consultants, evaluation of patient's response to treatment, examination of patient, obtaining history from patient or surrogate, ordering and performing treatments and interventions, ordering and review of laboratory studies, ordering and review of radiographic studies, pulse oximetry and re-evaluation of patient's condition.    Kammie Scioli, M.D.  Pulmonary & Critical Care Medicine

## 2023-10-29 NOTE — Progress Notes (Signed)
 Date of Admission:  10/26/2023      ID: Martin Riddle is a 53 y.o. male  Principal Problem:   Diabetic foot infection (HCC) Active Problems:   Atherosclerosis of native arteries of the extremities with ulceration (HCC)   HTN (hypertension)   HLD (hyperlipidemia)   Type 2 diabetes mellitus with peripheral neuropathy (HCC)   Obesity, Class III, BMI 40-49.9 (morbid obesity)   Severe sepsis (HCC)   Depression   Osteomyelitis of right foot (HCC)   Hypoglycemia   Gas gangrene (HCC)    Subjective: Had AKA yesterday Extubated today Awake and alert Says he has had better days  Medications:   sodium chloride    Intravenous Once   acetaminophen   500 mg Oral Q6H   arformoterol  15 mcg Nebulization BID   ascorbic acid  500 mg Oral BID   aspirin   81 mg Oral Daily   atorvastatin   20 mg Oral Daily   budesonide  (PULMICORT ) nebulizer solution  0.25 mg Nebulization BID   Chlorhexidine  Gluconate Cloth  6 each Topical Daily   cyanocobalamin  1,000 mcg Intramuscular Q1200   Followed by   Cecily Cohen ON 11/03/2023] vitamin B-12  1,000 mcg Oral Daily   docusate sodium   100 mg Oral BID   famotidine   20 mg Oral Daily   heparin   5,000 Units Subcutaneous Q8H   iron polysaccharides  150 mg Oral Daily   methocarbamol  (ROBAXIN ) injection  500 mg Intravenous Q6H   methylPREDNISolone  (SOLU-MEDROL ) injection  125 mg Intravenous Once   multivitamin with minerals  1 tablet Oral Daily   nutrition supplement (JUVEN)  1 packet Oral BID BM   polyethylene glycol  17 g Oral Daily   pregabalin   150 mg Oral BID   Ensure Max Protein  11 oz Oral QHS   zinc  sulfate (50mg  elemental zinc )  220 mg Oral Daily    Objective: Vital signs in last 24 hours: Patient Vitals for the past 24 hrs:  BP Temp Temp src Pulse Resp SpO2  10/29/23 1500 122/61 -- -- 97 20 94 %  10/29/23 1400 (!) 100/33 -- -- 97 (!) 22 94 %  10/29/23 1300 (!) 119/49 98.2 F (36.8 C) Oral 80 (!) 26 98 %  10/29/23 1200 (!) 74/62 -- -- 87 20 95  %  10/29/23 1100 (!) 121/51 -- -- 78 20 95 %  10/29/23 1000 (!) 121/47 -- -- 75 19 94 %  10/29/23 0905 -- -- -- -- -- 99 %  10/29/23 0900 (!) 155/69 -- -- 87 20 99 %  10/29/23 0825 -- -- -- -- -- 100 %  10/29/23 0800 (!) 118/45 98.8 F (37.1 C) Axillary 63 (!) 24 96 %  10/29/23 0743 -- -- -- -- -- 96 %  10/29/23 0700 (!) 122/55 -- -- 65 (!) 24 98 %  10/29/23 0628 (!) 124/48 -- -- 63 (!) 24 97 %  10/29/23 0614 (!) 126/47 -- -- 67 (!) 24 98 %  10/29/23 0600 137/82 -- -- 73 (!) 26 100 %  10/29/23 0500 (!) 144/68 -- -- 71 (!) 24 99 %  10/29/23 0400 (!) 134/52 -- -- 65 (!) 24 97 %  10/29/23 0300 (!) 132/49 -- -- 67 (!) 24 96 %  10/29/23 0200 (!) 134/51 -- -- 67 (!) 24 95 %  10/29/23 0100 -- -- -- 72 (!) 24 95 %  10/29/23 0025 -- -- -- 82 (!) 28 94 %  10/29/23 0000 (!) 160/80 -- -- 78 (!) 25  93 %  10/28/23 2050 -- -- -- 65 13 100 %  10/28/23 2000 (!) 114/57 -- -- 62 (!) 24 96 %  10/28/23 1930 (!) 112/55 98.3 F (36.8 C) Axillary (!) 58 (!) 24 97 %  10/28/23 1900 (!) 113/58 -- -- (!) 58 (!) 24 96 %  10/28/23 1845 (!) 116/57 -- -- (!) 59 (!) 24 96 %  10/28/23 1830 (!) 113/57 -- -- (!) 58 (!) 24 96 %  10/28/23 1815 (!) 109/54 -- -- (!) 58 (!) 24 96 %  10/28/23 1800 (!) 112/55 -- -- (!) 56 (!) 24 96 %  10/28/23 1745 (!) 110/51 -- -- (!) 57 (!) 24 96 %  10/28/23 1730 (!) 110/53 -- -- (!) 57 (!) 24 96 %  10/28/23 1715 (!) 107/52 -- -- (!) 59 (!) 24 96 %  10/28/23 1700 (!) 117/55 -- -- 61 (!) 22 97 %  10/28/23 1645 (!) 106/58 -- -- (!) 57 (!) 24 96 %  10/28/23 1630 (!) 103/49 -- -- (!) 133 20 97 %  10/28/23 1615 (!) 131/90 -- -- 71 20 100 %  10/28/23 1610 -- -- -- -- -- 96 %  10/28/23 1600 113/63 -- -- 60 (!) 24 100 %  10/28/23 1545 119/63 -- -- (!) 56 (!) 24 97 %       PHYSICAL EXAM:  General: awake and alert  Lungs: b/l air entry Heart: s1s2. Abdomen: Soft, non-tender,not distended. Bowel sounds normal. No masses Extremities: rt AKA Left leg edema and erythema and scabs  Lab  Results    Latest Ref Rng & Units 10/29/2023    3:31 AM 10/28/2023    3:16 AM 10/27/2023    4:59 AM  CBC  WBC 4.0 - 10.5 K/uL 8.6  8.2  15.6   Hemoglobin 13.0 - 17.0 g/dL 8.7  9.7  16.1   Hematocrit 39.0 - 52.0 % 28.7  32.7  37.4   Platelets 150 - 400 K/uL 278  269  298        Latest Ref Rng & Units 10/29/2023    3:31 AM 10/28/2023    3:16 AM 10/27/2023    4:59 AM  CMP  Glucose 70 - 99 mg/dL 99  096  045   BUN 6 - 20 mg/dL 32  41  34   Creatinine 0.61 - 1.24 mg/dL 4.09  8.11  9.14   Sodium 135 - 145 mmol/L 134  135  132   Potassium 3.5 - 5.1 mmol/L 4.9  4.4  4.9   Chloride 98 - 111 mmol/L 101  99  97   CO2 22 - 32 mmol/L 26  26  28    Calcium  8.9 - 10.3 mg/dL 8.0  8.1  8.0       Microbiology:  Studies/Results: DG Abd 1 View Result Date: 10/27/2023 CLINICAL DATA:  OG tube placement EXAM: ABDOMEN - 1 VIEW COMPARISON:  02/19/2018 FINDINGS: Enteric tube tip slightly low lying in the left mid to lower quadrant presumably within body of stomach. Visualized gas pattern is unremarkable IMPRESSION: Enteric tube tip slightly low lying in the left mid to lower quadrant presumably within body of stomach. Electronically Signed   By: Esmeralda Hedge M.D.   On: 10/27/2023 19:31   DG Chest Port 1 View Result Date: 10/27/2023 CLINICAL DATA:  ET and OG tube placement EXAM: PORTABLE CHEST 1 VIEW COMPARISON:  10/27/2023 FINDINGS: Interval intubation, tip of the endotracheal tube is about 4.6 cm superior to carina. Enteric tube  tip below the diaphragm but incompletely assessed. Enlarged cardiomediastinal silhouette with vascular congestion and worsening airspace disease at the left base. Possible small left effusion. No pneumothorax IMPRESSION: 1. Interval intubation, tip of the endotracheal tube about 4.6 cm superior to carina. Enteric tube tip below the diaphragm but incompletely assessed. 2. Enlarged cardiomediastinal silhouette with vascular congestion and worsening airspace disease and/or effusion at the  left base. Electronically Signed   By: Esmeralda Hedge M.D.   On: 10/27/2023 19:30     Assessment/Plan: Diabetic foot infection with rt great toe ulcerating necrotic wound- foul smelling discharge with sepsis underwent AKA yesterday Pt currently on pitazo and vanco-DC piptazo and give unasyn Will do both antibiotics for 3 more days to treat the left leg celluliits/wound Will change vanco to dapto Seen by podiatrist and vascular surgeon for Amputation of the leg     Acute hypoxia and hypercapnic resp failure intubated, now extubated   DM- had hypoglycemia after admission dn was on D50    ? H/o multiple back surgeries- hardware present Increase in BMI  Discussed with patient and his nurse ID will sign off- call if needed

## 2023-10-29 NOTE — Progress Notes (Signed)
  Progress Note    10/29/2023 9:54 AM 1 Day Post-Op  Subjective:  Martin Riddle is a 53 yo male now POD #1 from right above the knee amputation. Patient remains intubated and sedated this morning on exam. Nursing reports no difficulties overnight and vitals all remain stable. Plan to extubate today if possible.    Vitals:   10/29/23 0825 10/29/23 0905  BP:    Pulse:    Resp:    Temp:    SpO2: 100% 99%   Physical Exam: Cardiac:  RRR, Normal S1, S2. No  rubs, clicks or gallup's. No murmurs. Lungs: Ventilated on pressure support.  Lungs were rhonchorous throughout. Incisions: Right lower extremity AKA.  Clean dry and intact. Extremities: Right AKA dressing clean dry and intact.  No complications.  Left lower extremity with cellulitis +2 swelling.  Unable to palpate pulses.  Foot is warm to the touch. Abdomen: Positive bowel sounds throughout, soft, tender and distended. Neurologic: Intubated and sedated.  CBC    Component Value Date/Time   WBC 8.6 10/29/2023 0331   RBC 3.01 (L) 10/29/2023 0331   HGB 8.7 (L) 10/29/2023 0331   HGB 13.4 03/15/2014 0339   HCT 28.7 (L) 10/29/2023 0331   HCT 40.4 03/15/2014 0339   PLT 278 10/29/2023 0331   PLT 173 03/15/2014 0339   MCV 95.3 10/29/2023 0331   MCV 94 03/15/2014 0339   MCH 28.9 10/29/2023 0331   MCHC 30.3 10/29/2023 0331   RDW 15.8 (H) 10/29/2023 0331   RDW 13.1 03/15/2014 0339   LYMPHSABS 1.4 10/26/2023 1817   LYMPHSABS 2.2 03/12/2014 0600   MONOABS 0.9 10/26/2023 1817   MONOABS 1.3 (H) 03/12/2014 0600   EOSABS 0.1 10/26/2023 1817   EOSABS 0.0 03/12/2014 0600   BASOSABS 0.0 10/26/2023 1817   BASOSABS 0.1 03/12/2014 0600    BMET    Component Value Date/Time   NA 134 (L) 10/29/2023 0331   NA 135 (L) 03/15/2014 0339   K 4.9 10/29/2023 0331   K 3.1 (L) 03/15/2014 0339   CL 101 10/29/2023 0331   CL 104 03/15/2014 0339   CO2 26 10/29/2023 0331   CO2 24 03/15/2014 0339   GLUCOSE 99 10/29/2023 0331   GLUCOSE 127 (H)  03/15/2014 0339   BUN 32 (H) 10/29/2023 0331   BUN 9 03/15/2014 0339   CREATININE 0.81 10/29/2023 0331   CREATININE 0.64 03/15/2014 0339   CALCIUM  8.0 (L) 10/29/2023 0331   CALCIUM  8.4 (L) 03/15/2014 0339   GFRNONAA >60 10/29/2023 0331   GFRNONAA >60 03/15/2014 0339   GFRAA >60 02/28/2018 0429   GFRAA >60 03/15/2014 0339    INR    Component Value Date/Time   INR 1.2 10/26/2023 2136     Intake/Output Summary (Last 24 hours) at 10/29/2023 0954 Last data filed at 10/29/2023 0901 Gross per 24 hour  Intake 3112.51 ml  Output 4150 ml  Net -1037.49 ml     Assessment/Plan:  53 y.o. male is s/p right above-the-knee amputation.  1 Day Post-Op   PLAN Patient remains intubated and sedated.  ICU team plans to possibly extubate later today. First dressing change by vascular surgery to be done postop day 3.  DVT prophylaxis: Heparin  5000 units subcu every 8 hours   Annamaria Barrette [facility] 10/29/2023 9:54 AM

## 2023-10-29 NOTE — Progress Notes (Signed)
   10/29/23 1315  Spiritual Encounters  Type of Visit Follow up  Care provided to: Pt and family (Son is at bedside)  Referral source Chaplain assessment  Reason for visit Routine spiritual support  OnCall Visit Yes  Spiritual Framework  Presenting Themes Goals in life/care;Other (comment) (Pt said someone had given him a small figurine of Jesus right before surgery and now he can't find it. He said it was in his pocket and we wondered if it ended up in the laundry.)  Interventions  Spiritual Care Interventions Made Established relationship of care and support;Compassionate presence;Reflective listening;Meaning making  Intervention Outcomes  Outcomes Connection to spiritual care;Awareness of support

## 2023-10-29 NOTE — Procedures (Signed)
 Extubation Procedure Note  Patient Details:   Name: [name] DOB: 03/26/1971 MRN: [id number]   Airway Documentation:    Vent end date: 10/29/23 Vent end time: 0905   Evaluation  O2 sats: stable throughout Complications: No apparent complications Patient did tolerate procedure well. Bilateral Breath Sounds: Diminished   Yes Patient able to cough and speak  [name] 10/29/2023, 9:10 AM

## 2023-10-29 NOTE — Progress Notes (Signed)
 Patient c/o right leg pain r/t recent BKA and requesting PRN oxycodone .  BP currently 134/43 with MAP 64.  Jeremiah NP and Dr Jaclynn Mast notified of BP and per secure chat, ok to give oxycodone  at this time.  Also, Lina Render NP states that it is alright to give PRN pain medication as long as SBP>100.

## 2023-10-29 NOTE — Progress Notes (Signed)
 Nutrition Follow-up  DOCUMENTATION CODES:   Obesity unspecified  INTERVENTION:   -Liberalize diet to carb modified for widest variety of meal selections -MVI with minerals daily -500 mg vitamin C BID -220 mg zinc  sulfate daily x 145 days -1 packet Juven BID, each packet provides 95 calories, 2.5 grams of protein (collagen), and 9.8 grams of carbohydrate (3 grams sugar); also contains 7 grams of L-arginine and L-glutamine, 300 mg vitamin C, 15 mg vitamin E, 1.2 mcg vitamin B-12, 9.5 mg zinc , 200 mg calcium , and 1.5 g  Calcium  Beta-hydroxy-Beta-methylbutyrate to support wound healing  -Ensure Max po daily, each supplement provides 150 kcal and 30 grams of protein.   -Double protein portions with meals   NUTRITION DIAGNOSIS:   Inadequate oral intake related to inability to eat as evidenced by NPO status.  Progressing; advanced to PO diet on 10/29/23  GOAL:   Provide needs based on ASPEN/SCCM guidelines  Progressing   MONITOR:   Vent status  REASON FOR ASSESSMENT:   Ventilator    ASSESSMENT:   Pt with medical history significant of DM, atherosclerosis of native arteries of the extremities with ulceration, diabetic foot ulcer, HTN, HLD, depression, chronic venous stasis in both legs, pancreatitis, peripheral neuropathy, morbid obesity with BMI of 47, who presents with right foot wound infection.  5/20- IVC d/c, intubated  5/21- s/p rt AKA 5/22- extubated, advanced to Heart Healthy diet  Reviewed I/O's: -803 ml x 24 hours and -12.5 ml since admission  UOP: 3.3 L x 24 hours   RD visited pt awhile still intubated. Per discussion with RN, pt is doing well and trying to communicate. Likely plan for extubation today. No family present.   Case discussed with RN, MD, and during ICU rounds. Pt just extubated this morning. Pt is in a lot of pain, but otherwise doing well. Plan to obtain swallow screen so can advance to PO diet. CBGS have been stable. Per pharmacy, plan to continue  IV antibiotics today and switch to oral tomorrow. Plan for PT/OT consults soon.   Medications reviewed and include pulmicort , vitamin B-12, pepcid , colace, nifrex, solu-medrol ,and miralax .   Labs reviewed: CBGS: 30-95 (inpatient orders for glycemic control are none).    Diet Order:   Diet Order             Diet NPO time specified Except for: Sips with Meds  Diet effective midnight                   EDUCATION NEEDS:   Not appropriate for education at this time  Skin:  Skin Assessment: Skin Integrity Issues: Skin Integrity Issues:: Other (Comment) Other: rt foot gangrene with maggots, full thickness wounds on rt lower leg and rt foot (related to DM and PVD) and  Last BM:  Unknown  Height:   Ht Readings from Last 1 Encounters:  10/27/23 6\' 5"  (1.956 m)    Weight:   Wt Readings from Last 1 Encounters:  10/27/23 (!) 150.2 kg    Ideal Body Weight:  86.9 kg (adjustef for upcoming rt AKA)  BMI:  Body mass index is 39.27 kg/m.  Estimated Nutritional Needs:   Kcal:  1610-9604  Protein:  175-200 grams  Fluid:  2.0-2.2 L    Herschel Lords, RD, LDN, CDCES Registered Dietitian III Certified Diabetes Care and Education Specialist If unable to reach this RD, please use "RD Inpatient" group chat on secure chat between hours of 8am-4 pm daily

## 2023-10-30 DIAGNOSIS — E11628 Type 2 diabetes mellitus with other skin complications: Secondary | ICD-10-CM | POA: Diagnosis not present

## 2023-10-30 DIAGNOSIS — L089 Local infection of the skin and subcutaneous tissue, unspecified: Secondary | ICD-10-CM | POA: Diagnosis not present

## 2023-10-30 DIAGNOSIS — Z7189 Other specified counseling: Secondary | ICD-10-CM | POA: Diagnosis not present

## 2023-10-30 LAB — PREPARE RBC (CROSSMATCH)

## 2023-10-30 LAB — BASIC METABOLIC PANEL WITH GFR
Anion gap: 7 (ref 5–15)
BUN: 19 mg/dL (ref 6–20)
CO2: 28 mmol/L (ref 22–32)
Calcium: 8.3 mg/dL — ABNORMAL LOW (ref 8.9–10.3)
Chloride: 99 mmol/L (ref 98–111)
Creatinine, Ser: 0.82 mg/dL (ref 0.61–1.24)
GFR, Estimated: >60 mL/min (ref >60)
Glucose, Bld: 250 mg/dL — ABNORMAL HIGH (ref 70–99)
Potassium: 4.5 mmol/L (ref 3.5–5.1)
Sodium: 134 mmol/L — ABNORMAL LOW (ref 135–145)

## 2023-10-30 LAB — CBC
HCT: 28.7 % — ABNORMAL LOW (ref 39.0–52.0)
Hemoglobin: 9 g/dL — ABNORMAL LOW (ref 13.0–17.0)
MCH: 29.6 pg (ref 26.0–34.0)
MCHC: 31.4 g/dL (ref 30.0–36.0)
MCV: 94.4 fL (ref 80.0–100.0)
Platelets: 282 10*3/uL (ref 150–400)
RBC: 3.04 MIL/uL — ABNORMAL LOW (ref 4.22–5.81)
RDW: 15.3 % (ref 11.5–15.5)
WBC: 9 10*3/uL (ref 4.0–10.5)
nRBC: 0 % (ref 0.0–0.2)

## 2023-10-30 LAB — TYPE AND SCREEN
ABO/RH(D): A POS
Antibody Screen: NEGATIVE
Unit division: 0
Unit division: 0

## 2023-10-30 LAB — BPAM RBC
Blood Product Expiration Date: 202506252359
Blood Product Expiration Date: 202506252359
Unit Type and Rh: 6200
Unit Type and Rh: 6200

## 2023-10-30 LAB — PHOSPHORUS: Phosphorus: 2.2 mg/dL — ABNORMAL LOW (ref 2.5–4.6)

## 2023-10-30 LAB — GLUCOSE, CAPILLARY
Glucose-Capillary: 229 mg/dL — ABNORMAL HIGH (ref 70–99)
Glucose-Capillary: 233 mg/dL — ABNORMAL HIGH (ref 70–99)
Glucose-Capillary: 233 mg/dL — ABNORMAL HIGH (ref 70–99)
Glucose-Capillary: 246 mg/dL — ABNORMAL HIGH (ref 70–99)
Glucose-Capillary: 267 mg/dL — ABNORMAL HIGH (ref 70–99)
Glucose-Capillary: 282 mg/dL — ABNORMAL HIGH (ref 70–99)

## 2023-10-30 LAB — MAGNESIUM: Magnesium: 2.1 mg/dL (ref 1.7–2.4)

## 2023-10-30 MED ORDER — HYDROMORPHONE HCL 1 MG/ML IJ SOLN
1.5000 mg | INTRAMUSCULAR | Status: DC | PRN
  Administered 2023-10-30 – 2023-11-03 (×9): 1.5 mg via INTRAVENOUS
  Filled 2023-10-30: qty 1.5
  Filled 2023-10-30 (×2): qty 2
  Filled 2023-10-30 (×2): qty 1.5
  Filled 2023-10-30 (×3): qty 2
  Filled 2023-10-30: qty 1.5

## 2023-10-30 MED ORDER — SODIUM CHLORIDE 0.9 % IV SOLN: INTRAVENOUS | Status: AC | PRN

## 2023-10-30 MED ORDER — KETOROLAC TROMETHAMINE 30 MG/ML IJ SOLN
30.0000 mg | Freq: Four times a day (QID) | INTRAMUSCULAR | Status: AC
  Administered 2023-10-30 – 2023-11-02 (×12): 30 mg via INTRAVENOUS
  Filled 2023-10-30 (×12): qty 1

## 2023-10-30 MED ORDER — INSULIN GLARGINE-YFGN 100 UNIT/ML ~~LOC~~ SOLN
15.0000 [IU] | Freq: Every day | SUBCUTANEOUS | Status: DC
  Administered 2023-10-30: 15 [IU] via SUBCUTANEOUS
  Filled 2023-10-30 (×2): qty 0.15

## 2023-10-30 MED ORDER — HYDROMORPHONE HCL 1 MG/ML IJ SOLN
1.0000 mg | INTRAMUSCULAR | Status: AC
  Administered 2023-10-30: 1 mg via INTRAVENOUS
  Filled 2023-10-30: qty 1

## 2023-10-30 MED ORDER — OXYCODONE HCL 5 MG PO TABS
10.0000 mg | ORAL_TABLET | ORAL | Status: DC | PRN
  Filled 2023-10-30 (×2): qty 2

## 2023-10-30 MED ORDER — INSULIN ASPART 100 UNIT/ML IJ SOLN: 0.0000 [IU] | Freq: Every day | INTRAMUSCULAR | Status: DC

## 2023-10-30 MED ORDER — INSULIN ASPART 100 UNIT/ML IJ SOLN
0.0000 [IU] | Freq: Three times a day (TID) | INTRAMUSCULAR | Status: DC
  Administered 2023-10-30: 3 [IU] via SUBCUTANEOUS
  Administered 2023-10-30: 5 [IU] via SUBCUTANEOUS
  Administered 2023-10-31: 7 [IU] via SUBCUTANEOUS
  Filled 2023-10-30 (×2): qty 1

## 2023-10-30 MED ORDER — OXYCODONE HCL 5 MG PO TABS
15.0000 mg | ORAL_TABLET | ORAL | Status: DC | PRN
  Administered 2023-10-30 – 2023-11-09 (×40): 15 mg via ORAL
  Filled 2023-10-30 (×39): qty 3

## 2023-10-30 NOTE — TOC Initial Note (Signed)
 Transition of Care [facility]) - Initial/Assessment Note    Patient Details  Name: Martin Riddle MRN: [id number] Date of Birth: May 22, 1971  Transition of [facility]) CM/SW Contact:    Sadarius Norman A Jewelia Bocchino, RN Phone Number: 10/30/2023, 11:54 AM  Clinical Narrative:        Chart reviewed.  Noted that patient was admitted with Diabetic foot infection.  Patient is day one post-op for a right above the knee amputation.    I have meet with patient at bedside today.  He informs me that prior to admission he lived at home with his wife.  He reports that prior to admission he was able to bath and dress himself. He informed me that he was able to get around with a walker at  home.  He reported that his wife took him to medical appointments.  Mrs. Jurgens informed me that medications were affordable and that he used [facility] for medications.    I have spoken to him about levels of rehab.  Noted that PT/OT evaluations pending.  Noted that RT AKA is new on this admission.      TOC will continue to follow for discharge planning.              Expected Discharge Plan:  (TBD) Barriers to Discharge: No Barriers Identified   Patient Goals and CMS Choice            Expected Discharge Plan and Services   Discharge Planning Services: CM Consult Post Acute Care Choice:  (Pending PT/OT evaluations.) Living arrangements for the past 2 months: Single Family Home                                      Prior Living Arrangements/Services Living arrangements for the past 2 months: Single Family Home Lives with:: Spouse Patient language and need for interpreter reviewed:: Yes Do you feel safe going back to the place where you live?: Yes        Care giver support system in place?: Yes (comment) (Patient has supportive spouse) Current home services:  (Patient reports that he has a walker with a seat, and crutches at home.)    Activities of Daily Living   ADL Screening (condition at time of  admission) Independently performs ADLs?: No Does the patient have a NEW difficulty with bathing/dressing/toileting/self-feeding that is expected to last >3 days?: Yes (Initiates electronic notice to provider for possible OT consult) Does the patient have a NEW difficulty with getting in/out of bed, walking, or climbing stairs that is expected to last >3 days?: No Does the patient have a NEW difficulty with communication that is expected to last >3 days?: No Is the patient deaf or have difficulty hearing?: No Does the patient have difficulty seeing, even when wearing glasses/contacts?: No Does the patient have difficulty concentrating, remembering, or making decisions?: Yes  Permission Sought/Granted                  Emotional Assessment Appearance:: Appears stated age Attitude/Demeanor/Rapport: Engaged Affect (typically observed): Appropriate Orientation: : Oriented to Self, Oriented to Place, Oriented to  Time, Oriented to Situation      Admission diagnosis:  Wound infection [T14.8XXA, L08.9] Diabetic foot infection (HCC) [W29.562, L08.9] Patient Active Problem List   Diagnosis Date Noted   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   Diabetic foot infection (HCC) 10/26/2023   HTN (hypertension) 10/26/2023   HLD (hyperlipidemia)  10/26/2023   Type 2 diabetes mellitus with peripheral neuropathy (HCC) 10/26/2023   Obesity, Class III, BMI 40-49.9 (morbid obesity) 10/26/2023   Severe sepsis (HCC) 10/26/2023   Depression 10/26/2023   Osteomyelitis of right foot (HCC) 10/26/2023   Hypoglycemia 10/26/2023   Atherosclerosis of native arteries of the extremities with ulceration (HCC) 10/20/2023   Hypertension    Failure to attend appointment 07/07/2022   Cervical radiculopathy 01/07/2022   Lumbar radiculopathy 01/07/2022   Pain syndrome, chronic 01/07/2022   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   History of lumbar spinal fusion 01/07/2022   Chronic venous stasis dermatitis of both lower  extremities    Cellulitis 05/30/2021   Multiple open wounds of lower extremity, initial encounter 05/30/2021   Respiratory failure (HCC) 02/19/2018   Severe recurrent major depression with psychotic features (HCC) 01/09/2017   Congenital flat foot 01/16/2015   DDD (degenerative disc disease), lumbar 01/16/2015   Diabetes mellitus, type 2 (HCC) 01/16/2015   Hypercholesteremia 01/16/2015   Gastro-esophageal reflux disease without esophagitis 01/16/2015   Peripheral neuropathy 01/16/2015   PCP:  Thomasene Flemings, NP Pharmacy:   [id number] [place],  - [address] AT [facility] [address] [place] [address] [place] [address] Phone: [phone] Fax: [phone]     Social Drivers of Health (SDOH) Social History: SDOH Screenings   Food Insecurity: Patient Declined (10/27/2023)  Housing: Patient Declined (10/27/2023)  Transportation Needs: Patient Declined (10/27/2023)  Utilities: Patient Declined (10/27/2023)  Alcohol Screen: Low Risk  (05/08/2017)  Social Connections: Patient Declined (10/27/2023)  Tobacco Use: High Risk (10/26/2023)   SDOH Interventions:     Readmission Risk Interventions     No data to display

## 2023-10-30 NOTE — Progress Notes (Signed)
 NAME:  Martin Riddle, MRN:  [id number], DOB:  [birth year], LOS: 4 ADMISSION DATE:  10/26/2023, CONSULTATION DATE: 10/27/2023 REFERRING MD: Dr. Hubert Madden, CHIEF COMPLAINT: Right Foot Wound   History of Present Illness:  This is a 53 yo male who presented to [facility] ER on 05/19 with a wound to his right foot resulting in worsening pain.  Per ER notes pts wife reported the wound initially started off as a blister that popped and has gradually worsened.  He was evaluated by vascular surgery in the outpatient setting on 05/13 with nonhealing ulcerations.  Vascular Surgery recommended angiography with possible revascularization during that office visit     ED Course Upon arrival to the ER significant lab results were: Na+ 131/chloride 94/glucose 159/BUN 32/albumin 3.0/lactic acid 2.3/wbc 11.3/hgb 10.9.  Right foot x-ray revealed erosion of the distal phalanx concerning for acute on chronic osteomyelitis as well as osteomyelitis of the proximal phalanx and soft tissue emphysema about the first MPJ concerning for gas gangrene.  Vital signs were stable.  Pt started on vancomycin  and zosyn .  Pt subsequently admitted to the medsurg unit per hospitalist team for additional workup and treatment.  However, he remained in the ER pending bed availability.  See detailed hospital course below under significant events.   Pertinent  Medical History  Type II Diabetes Mellitus  Hypercholesteremia  HTN  Pancreatitis  Atherosclerosis of Bilateral Lower Extremities with Ulceration  Former Smoker   Significant Hospital Events: Including procedures, antibiotic start and stop dates in addition to other pertinent events   05/19: Pt admitted to the [facility] unit with gangrene of right foot, however he remained in the ER pending bed availability 05/20: Rapid response initiated pt minimally responsive found to be hypoglycemic CBG <10 pt received 1 amp of D50W and placed on Bipap and transported to the stepdown unit.  He was  initially IVC'd by psychiatry due to concern of inability to make sound medical decisions for himself.  Pts mentation improved and he was transitioned to nasal canula with improvement in CBG's.  Psych rescinded the IVC and pt deemed competent to make medical decisions.  Unfortunately, later during the shift pt became unresponsive and hypoxic requiring mechanical intubation 10/28/23- patient scheduled for amputation today due to critical limb ischemia with gangrene of bilateral lower extermities. He remains on MV with sedation.  10/29/23- s/p right amputation.  Blood glucose stable. For bedside SLP today which he passed and diet has been initiated. He is liberated off ventilator.  10/30/23- patient improved and optimized for TRH transfer. No overnight events.  Dr Leory Rands is taking over her care and PCCM will sign off at this time.    Objective    Blood pressure (!) 162/90, pulse 88, temperature 98.5 F (36.9 C), temperature source Oral, resp. rate 16, height 6\' 5"  (1.956 m), weight (!) 150.2 kg, SpO2 97%.    Vent Mode: PSV;CPAP FiO2 (%):  [30 %-36 %] 36 % PEEP:  [8 cmH20] 8 cmH20 Pressure Support:  [5 cmH20] 5 cmH20   Intake/Output Summary (Last 24 hours) at 10/30/2023 0820 Last data filed at 10/30/2023 6578 Gross per 24 hour  Intake 1169.24 ml  Output 7200 ml  Net -6030.76 ml   Filed Weights   10/26/23 1804 10/27/23 1247  Weight: (!) 181.4 kg (!) 150.2 kg    Examination: General: Acute on chronically-ill appearing male, NAD HENT: Difficult to assess for JVD due to body habitus Lungs: Diminished throughout, even, non labored  Cardiovascular: Sinus rhythm with depressed  T wave, no m/r/g, 2+ radial/1+ distal pulses via doppler, 2+ bilateral lower extremity edema  Abdomen: +BS x4, obese, soft, non distended  Extremities/Skin: see images below  Right foot   Left foot   Left foot   Neuro: Sedated/paralyzed, not following commands, PERRL  GU: Pending indwelling catheter placement    Resolved problem list   Assessment and Plan   #Acute toxic metabolic encephalopathy -RESOLVED #Mechanical intubation pain/discomfort  - RESOLVED  #Hypotension: sedation related and septic shock- RESOLVED #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  Hx: Hypercholesteremia and HTN  - Continuous telemetry monitoring  - Continue outpatient aspirin  and atorvastatin   - Hold outpatient antihypertensives and diuretics  - Prn levophed  gtt to maintain map 65 or higher  - Troponin pending   #Acute hypoxic hypercapnic respiratory failure suspect secondary to OHS due to morbid obesity - RESOLVED    #Acute kidney injury likely secondary to ATN- RESOLVED  #Hyponatremia  - Trend BMP and lactic acid  - Replace electrolytes as indicated  - Strict I&O's - Avoid nephrotoxic agents as able   #Sepsis secondary to severe right foot infection/osteomyelitis with possible gas gangrene of the first MPJ - IMPROVED POST AMPUTATION - ID on case plan for IV abx for 24h  #Iron deficiency anemia  - Trend CBC  - Monitor for s/sx of bleeding  - Transfuse for hgb <7 - Continue iron supplementation   #Hypoglycemia~RESOLVED #Type II diabetes mellitus  Hemoglobin A1c 10/27/23: 8.9 - CBG's q1hr for now  - Follow hypo/hyperglycemic protocol  - Diabetes coordinator consulted appreciate input - Hold subcutaneous insulin  for now until CBG's are consistently over 180 mg/dl  - Target CBG range 604 to 180   Best Practice (right click and "Reselect all SmartList Selections" daily)   Diet/type: NPO DVT prophylaxis prophylactic heparin   Pressure ulcer(s): N/A GI prophylaxis: H2B Lines: N/A Foley:  Yes, and it is still needed Code Status:  full code Last date of multidisciplinary goals of care discussion [10/27/2023]   Labs   CBC: Recent Labs  Lab 10/26/23 1817 10/27/23 0459 10/28/23 0316 10/29/23 0331 10/30/23 0518  WBC 11.3* 15.6* 8.2 8.6 9.0  NEUTROABS  8.7*  --   --   --   --   HGB 10.9* 11.1* 9.7* 8.7* 9.0*  HCT 36.2* 37.4* 32.7* 28.7* 28.7*  MCV 96.5 98.7 96.5 95.3 94.4  PLT 238 298 269 278 282    Basic Metabolic Panel: Recent Labs  Lab 10/26/23 1817 10/27/23 0459 10/28/23 0316 10/29/23 0331 10/30/23 0518  NA 131* 132* 135 134* 134*  K 4.0 4.9 4.4 4.9 4.5  CL 94* 97* 99 101 99  CO2 24 28 26 26 28   GLUCOSE 159* 134* 104* 99 250*  BUN 32* 34* 41* 32* 19  CREATININE 1.20 1.30* 1.21 0.81 0.82  CALCIUM  8.3* 8.0* 8.1* 8.0* 8.3*  MG  --  2.2 2.5* 2.4 2.1  PHOS  --  7.3* 6.4* 4.1 2.2*   GFR: Estimated Creatinine Clearance: 167.3 mL/min (by C-G formula based on SCr of 0.82 mg/dL). Recent Labs  Lab 10/26/23 2331 10/27/23 0146 10/27/23 0459 10/27/23 1900 10/27/23 2107 10/28/23 0316 10/29/23 0331 10/30/23 0518  WBC  --   --  15.6*  --   --  8.2 8.6 9.0  LATICACIDVEN 1.8 1.0  --  0.7 1.0  --   --   --     Liver Function Tests: Recent Labs  Lab 10/26/23 1817  AST 21  ALT  18  ALKPHOS 63  BILITOT 0.7  PROT 7.7  ALBUMIN 3.0*   No results for input(s): "LIPASE", "AMYLASE" in the last 168 hours. No results for input(s): "AMMONIA" in the last 168 hours.  ABG    Component Value Date/Time   PHART 7.35 10/28/2023 0154   PCO2ART 58 (H) 10/28/2023 0154   PO2ART 168 (H) 10/28/2023 0154   HCO3 32.0 (H) 10/28/2023 0154   ACIDBASEDEF 8.7 (H) 02/19/2018 1533   O2SAT 99.6 10/28/2023 0154     Coagulation Profile: Recent Labs  Lab 10/26/23 2136  INR 1.2    Cardiac Enzymes: No results for input(s): "CKTOTAL", "CKMB", "CKMBINDEX", "TROPONINI" in the last 168 hours.  HbA1C: Hemoglobin A1C  Date/Time Value Ref Range Status  03/11/2014 06:20 AM 12.3 (H) 4.2 - 6.3 % Final    Comment:    The American Diabetes Association recommends that a primary goal of therapy should be <7% and that physicians should reevaluate the treatment regimen in patients with HbA1c values consistently >8%.    Hgb A1c MFr Bld  Date/Time  Value Ref Range Status  10/27/2023 01:02 PM 8.9 (H) 4.8 - 5.6 % Final    Comment:    (NOTE) Pre diabetes:          5.7%-6.4%  Diabetes:              >6.4%  Glycemic control for   <7.0% adults with diabetes   05/31/2021 07:43 AM 6.3 (H) 4.8 - 5.6 % Final    Comment:    (NOTE)         Prediabetes: 5.7 - 6.4         Diabetes: >6.4         Glycemic control for adults with diabetes: <7.0     CBG: Recent Labs  Lab 10/29/23 1356 10/29/23 1543 10/30/23 0012 10/30/23 0407 10/30/23 0738  GLUCAP 155* 159* 267* 233* 233*    Review of Systems:   Unable to assess pt mechanically intubated   Past Medical History:  He,  has a past medical history of Diabetes mellitus without complication (HCC), Hypercholesteremia, Hypertension, and Pancreatitis.   Surgical History:   Past Surgical History:  Procedure Laterality Date   AMPUTATION Right 10/28/2023   Procedure: AMPUTATION, ABOVE KNEE;  Surgeon: Jackquelyn Mass, MD;  Location: [facility] ORS;  Service: Vascular;  Laterality: Right;   ANKLE SURGERY Left 2001,2003   BACK SURGERY  2003,2012     Social History:   reports that he has quit smoking. His smoking use included cigarettes. His smokeless tobacco use includes chew. He reports that he does not drink alcohol and does not use drugs.   Family History:  His family history includes Diabetes in his mother and sister; Heart disease in his mother.   Allergies Allergies  Allergen Reactions   Morphine Other (See Comments)    headache     A HEADACHE AND SWEATING AND LETHARGIC headache  headache, ,  A HEADACHE AND SWEATING AND LETHARGIC headache   Morphine And Codeine Other (See Comments)    headache   Other Other (See Comments)    A HEADACHE AND SWEATING AND LETHARGIC    headache  A HEADACHE AND SWEATING AND LETHARGIC, , headache     Home Medications  Prior to Admission medications   Medication Sig Start Date End Date Taking? Authorizing Provider  acetaminophen  (TYLENOL )  500 MG tablet Take 1,000 mg by mouth every 6 (six) hours as needed for mild pain or moderate pain.  Yes [provider]  atorvastatin  (LIPITOR) 20 MG tablet Take 1 tablet (20 mg total) by mouth daily. 06/02/21  Yes Luna Salinas, MD  busPIRone  (BUSPAR ) 15 MG tablet Take 15 mg by mouth 3 (three) times daily.   Yes [provider]  cetirizine (ZYRTEC) 10 MG tablet Take 10 mg by mouth daily.   Yes [provider]  cloNIDine  (CATAPRES ) 0.2 MG tablet Take 0.2 mg by mouth 3 (three) times daily.   Yes [provider]  DULoxetine  (CYMBALTA ) 60 MG capsule Take 60 mg by mouth daily.   Yes [provider]  HUMALOG  100 UNIT/ML injection Inject 25 Units into the skin 2 (two) times daily.   Yes [provider]  LANTUS  100 UNIT/ML injection SMARTSIG:100 Unit(s) SUB-Q Twice Daily   Yes [provider]  losartan  (COZAAR ) 100 MG tablet Take 100 mg by mouth. 04/23/22  Yes [provider]  nitroGLYCERIN (NITROSTAT) 0.4 MG SL tablet Place under the tongue. 10/02/23  Yes [provider]  pregabalin  (LYRICA ) 25 MG capsule Take 1 capsule (25 mg total) by mouth 3 (three) times daily. 07/05/21 10/19/24 Yes Heather Litter, MD  rOPINIRole (REQUIP) 1 MG tablet Take 1 mg by mouth at bedtime. 04/11/22  Yes [provider]  gemfibrozil  (LOPID ) 600 MG tablet Take 1 tablet (600 mg total) by mouth 2 (two) times daily before a meal. Patient not taking: Reported on 10/27/2023 01/14/17   Marquita Situ, MD  insulin  NPH Human (NOVOLIN N) 100 UNIT/ML injection Inject 0-50 Units into the skin 2 (two) times daily. Patient not taking: Reported on 10/27/2023    [provider]  insulin  regular (NOVOLIN R) 100 units/mL injection Inject into the skin See admin instructions. Inject twice daily according to blood glucose reading Patient not taking: Reported on 10/27/2023    [provider]  losartan -hydrochlorothiazide  (HYZAAR)  100-25 MG tablet Take 1 tablet by mouth daily. Patient not taking: Reported on 10/27/2023    [provider]  Multiple Vitamin (MULTIVITAMIN WITH MINERALS) TABS tablet Take 1 tablet by mouth daily. Patient not taking: Reported on 10/20/2023 06/02/21   Amin, Sumayya, MD  naproxen sodium (ALEVE) 220 MG tablet Take 440 mg by mouth 2 (two) times daily as needed (pain). Patient not taking: Reported on 10/20/2023    [provider]  polyethylene glycol (MIRALAX  / GLYCOLAX ) packet Take 17 g by mouth daily. Patient not taking: Reported on 10/20/2023 01/15/17   Marquita Situ, MD  torsemide  (DEMADEX ) 20 MG tablet Take 1 tablet (20 mg total) by mouth daily. Patient not taking: Reported on 10/20/2023 06/01/21   Amin, Sumayya, MD      Daylee Delahoz, M.D.  Pulmonary & Critical Care Medicine

## 2023-10-30 NOTE — Plan of Care (Signed)
  Problem: Education: Goal: Ability to describe self-care measures that may prevent or decrease complications (Diabetes Survival Skills Education) will improve Outcome: Progressing Goal: Individualized Educational Video(s) Outcome: Progressing   Problem: Coping: Goal: Ability to adjust to condition or change in health will improve Outcome: Progressing   Problem: Fluid Volume: Goal: Ability to maintain a balanced intake and output will improve Outcome: Progressing   Problem: Health Behavior/Discharge Planning: Goal: Ability to identify and utilize available resources and services will improve Outcome: Progressing Goal: Ability to manage health-related needs will improve Outcome: Progressing   Problem: Metabolic: Goal: Ability to maintain appropriate glucose levels will improve Outcome: Progressing   Problem: Nutritional: Goal: Maintenance of adequate nutrition will improve Outcome: Progressing Goal: Progress toward achieving an optimal weight will improve Outcome: Progressing   Problem: Skin Integrity: Goal: Risk for impaired skin integrity will decrease Outcome: Progressing   Problem: Tissue Perfusion: Goal: Adequacy of tissue perfusion will improve Outcome: Progressing   Problem: Clinical Measurements: Goal: Ability to avoid or minimize complications of infection will improve Outcome: Progressing   Problem: Skin Integrity: Goal: Skin integrity will improve Outcome: Progressing   Problem: Education: Goal: Knowledge of General Education information will improve Description: Including pain rating scale, medication(s)/side effects and non-pharmacologic comfort measures Outcome: Progressing   Problem: Health Behavior/Discharge Planning: Goal: Ability to manage health-related needs will improve Outcome: Progressing   Problem: Clinical Measurements: Goal: Ability to maintain clinical measurements within normal limits will improve Outcome: Progressing Goal: Will  remain free from infection Outcome: Progressing Goal: Diagnostic test results will improve Outcome: Progressing Goal: Respiratory complications will improve Outcome: Progressing Goal: Cardiovascular complication will be avoided Outcome: Progressing   Problem: Activity: Goal: Risk for activity intolerance will decrease Outcome: Progressing   Problem: Nutrition: Goal: Adequate nutrition will be maintained Outcome: Progressing   Problem: Coping: Goal: Level of anxiety will decrease Outcome: Progressing   Problem: Elimination: Goal: Will not experience complications related to bowel motility Outcome: Progressing Goal: Will not experience complications related to urinary retention Outcome: Progressing   Problem: Pain Managment: Goal: General experience of comfort will improve and/or be controlled Outcome: Progressing   Problem: Safety: Goal: Ability to remain free from injury will improve Outcome: Progressing   Problem: Skin Integrity: Goal: Risk for impaired skin integrity will decrease Outcome: Progressing

## 2023-10-30 NOTE — Inpatient Diabetes Management (Signed)
 Inpatient Diabetes Program Recommendations  AACE/ADA: New Consensus Statement on Inpatient Glycemic Control (2015)  Target Ranges:  Prepandial:   less than 140 mg/dL      Peak postprandial:   less than 180 mg/dL (1-2 hours)      Critically ill patients:  140 - 180 mg/dL   Lab Results  Component Value Date   GLUCAP 233 (H) 10/30/2023   HGBA1C 8.9 (H) 10/27/2023    Review of Glycemic Control  Latest Reference Range & Units 10/29/23 11:23 10/29/23 13:56 10/29/23 15:43 10/30/23 00:12 10/30/23 04:07  Glucose-Capillary 70 - 99 mg/dL 80 604 (H) 540 (H) 981 (H) 233 (H)  Diabetes history: DM2 Outpatient Diabetes medications: Lantus  100 units BID, Humalog  25 units BID Current orders for Inpatient glycemic control:  None  Inpatient Diabetes Program Recommendations:    Note insulin  held due to hypoglycemia. CBG's >180 mg/dL this AM.  Consider restarting Novolog  sensitive tid with meals and HS and possibly low dose basal (Semglee  15 units daily).   Thanks,  Josefa Ni, RN, BC-ADM Inpatient Diabetes Coordinator Pager [phone]  (8a-5p)

## 2023-10-30 NOTE — Progress Notes (Addendum)
 Progress Note    Martin Riddle  ZOX:[id number] DOB: 10-31-1970  DOA: 10/26/2023 PCP: Thomasene Flemings, NP      Brief Narrative:    Medical records reviewed and are as summarized below:  Martin Riddle is a 53 y.o. male with medical history significant for DM, atherosclerosis of native arteries of the extremities with ulceration, diabetic foot ulcer, HTN, HLD, depression, chronic venous stasis in both legs, pancreatitis, peripheral neuropathy, morbid obesity, who presented to the hospital with right foot wound infection.  He complained of pain in the right foot and also noticed a foul smell.  He had had the wound for more than 3 weeks.  He had been previously evaluated by Dr. Vonna Guardian, vascular surgeon, on 10/20/2023, and angiography and possible revascularization had been recommended on that visit.  He was admitted to the hospital for severe sepsis secondary to right foot infection/osteomyelitis.  He was started on empiric IV Zosyn  and vancomycin .   Significant Hospital Events: Including procedures, antibiotic start and stop dates in addition to other pertinent events   05/19: Pt admitted to the [facility] unit with gangrene of right foot, however he remained in the ER pending bed availability 05/20: Rapid response initiated pt minimally responsive found to be hypoglycemic CBG <10 pt received 1 amp of D50W and placed on Bipap and transported to the stepdown unit.  He was initially IVC'd by psychiatry due to concern of inability to make sound medical decisions for himself.  Pts mentation improved and he was transitioned to nasal canula with improvement in CBG's.  Psych rescinded the IVC and pt deemed competent to make medical decisions.  Unfortunately, later during the shift pt became unresponsive and hypoxic requiring mechanical intubation 10/28/23- patient scheduled for amputation today due to critical limb ischemia with gangrene of bilateral lower extermities. He remains on MV with sedation.   10/29/23- s/p right amputation.  Blood glucose stable. For bedside SLP today which he passed and diet has been initiated. He is liberated off ventilator.          Assessment/Plan:   Principal Problem:   Diabetic foot infection (HCC) Active Problems:   Osteomyelitis of right foot (HCC)   Atherosclerosis of native arteries of the extremities with ulceration (HCC)   Severe sepsis (HCC)   Type 2 diabetes mellitus with peripheral neuropathy (HCC)   HTN (hypertension)   HLD (hyperlipidemia)   Depression   Obesity, Class III, BMI 40-49.9 (morbid obesity)   Hypoglycemia   Gas gangrene (HCC)   Nutrition Problem: Inadequate oral intake Etiology: inability to eat  Signs/Symptoms: NPO status   Body mass index is 39.27 kg/m.     Acute hypoxic and hypercapnic respiratory failure: Improved.  S/p extubation on 10/29/2023.  He is on 2 L/min oxygen via [place].   Septic shock secondary to right foot osteomyelitis with gangrene of first metatarsophalangeal joint:  Shock physiology has resolved.  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 Follow-up with ID and vascular surgeon.   AKI: Resolved Hyponatremia: Improved   Type II DM with hyperglycemia, s/p hypoglycemia: Hypoglycemia has resolved Restart Lantus .  Started at 15 units daily. Use NovoLog  as needed for hyperglycemia. Hemoglobin A1c 8.9.   Acute toxic metabolic encephalopathy: Improved (He became unresponsive and was intubated on 10/27/2023).   Comorbidities include hypertension, hyperlipidemia, depression, OHS   Patient was transferred from ICU service to [facility] team on 10/30/2023   Diet Order  Diet Carb Modified Fluid consistency: Thin  Diet effective now                            Consultants: Vascular surgeon Podiatrist ID specialist Intensivist  Procedures: Intubation and mechanical ventilation Right above-knee amputation on  10/28/2023    Medications:    acetaminophen   500 mg Oral Q6H   arformoterol  15 mcg Nebulization BID   ascorbic acid  500 mg Oral BID   aspirin   81 mg Oral Daily   atorvastatin   20 mg Oral Daily   budesonide  (PULMICORT ) nebulizer solution  0.25 mg Nebulization BID   Chlorhexidine  Gluconate Cloth  6 each Topical Daily   cyanocobalamin  1,000 mcg Intramuscular Q1200   Followed by   Cecily Cohen ON 11/03/2023] vitamin B-12  1,000 mcg Oral Daily   docusate sodium   100 mg Oral BID   famotidine   20 mg Oral Daily   heparin   5,000 Units Subcutaneous Q8H   insulin  aspart  0-5 Units Subcutaneous QHS   insulin  aspart  0-9 Units Subcutaneous TID WC   insulin  glargine-yfgn  15 Units Subcutaneous Daily   iron polysaccharides  150 mg Oral Daily   methocarbamol  (ROBAXIN ) injection  500 mg Intravenous Q6H   methylPREDNISolone  (SOLU-MEDROL ) injection  125 mg Intravenous Once   multivitamin with minerals  1 tablet Oral Daily   nutrition supplement (JUVEN)  1 packet Oral BID BM   polyethylene glycol  17 g Oral Daily   pregabalin   150 mg Oral BID   Ensure Max Protein  11 oz Oral QHS   zinc  sulfate (50mg  elemental zinc )  220 mg Oral Daily   Continuous Infusions:  sodium chloride      ampicillin -sulbactam (UNASYN) IV Stopped (10/30/23 0730)   dextrose  50 mL/hr at 10/30/23 0900   vancomycin  Stopped (10/30/23 0039)     Anti-infectives (From admission, onward)    Start     Dose/Rate Route Frequency Ordered Stop   10/29/23 2200  vancomycin  (VANCOREADY) IVPB 1500 mg/300 mL        1,500 mg 150 mL/hr over 120 Minutes Intravenous Every 12 hours 10/29/23 1639     10/29/23 1800  Ampicillin -Sulbactam (UNASYN) 3 g in sodium chloride  0.9 % 100 mL IVPB        3 g 200 mL/hr over 30 Minutes Intravenous Every 6 hours 10/29/23 1637     10/28/23 1100  ceFAZolin (ANCEF) IVPB 2g/100 mL premix        2 g 200 mL/hr over 30 Minutes Intravenous 30 min pre-op 10/27/23 2242 10/28/23 2130   10/27/23 2239  ceFAZolin (ANCEF)  IVPB 2g/100 mL premix  Status:  Discontinued        2 g 200 mL/hr over 30 Minutes Intravenous 30 min pre-op 10/27/23 2239 10/27/23 2242   10/27/23 1000  vancomycin  (VANCOREADY) IVPB 1750 mg/350 mL  Status:  Discontinued        1,750 mg 175 mL/hr over 120 Minutes Intravenous Every 12 hours 10/26/23 2322 10/29/23 1639   10/27/23 0300  piperacillin -tazobactam (ZOSYN ) IVPB 3.375 g  Status:  Discontinued        3.375 g 12.5 mL/hr over 240 Minutes Intravenous Every 8 hours 10/26/23 2305 10/29/23 1637   10/26/23 2230  vancomycin  (VANCOCIN ) IVPB 1000 mg/200 mL premix  Status:  Discontinued       Placed in "And" Linked Group   1,000 mg 200 mL/hr over 60 Minutes Intravenous Every 24 hours 10/26/23 2048 10/27/23  1100   10/26/23 2130  piperacillin -tazobactam (ZOSYN ) IVPB 3.375 g        3.375 g 100 mL/hr over 30 Minutes Intravenous  Once 10/26/23 2041 10/26/23 2157   10/26/23 2100  vancomycin  (VANCOREADY) IVPB 1500 mg/300 mL  Status:  Discontinued       Placed in "And" Linked Group   1,500 mg 150 mL/hr over 120 Minutes Intravenous Every 24 hours 10/26/23 2048 10/27/23 1100   10/26/23 2045  vancomycin  (VANCOREADY) IVPB 2000 mg/400 mL  Status:  Discontinued        2,000 mg 200 mL/hr over 120 Minutes Intravenous  Once 10/26/23 2041 10/26/23 2048              Family Communication/Anticipated D/C date and plan/Code Status   DVT prophylaxis: heparin  injection 5,000 Units Start: 10/26/23 2200     Code Status: Full Code  Family Communication: None Disposition Plan: Plan to discharge to SNF   Status is: Inpatient Remains inpatient appropriate because: Right foot infection       Subjective:   Interval events noted.  He complains of pain in his right lower extremity at the stump site.  Objective:    Vitals:   10/30/23 0800 10/30/23 0805 10/30/23 0843 10/30/23 0900  BP: 124/60   (!) 126/54  Pulse: 72   74  Resp: (!) 24   16  Temp:   98.1 F (36.7 C)   TempSrc:   Oral    SpO2: 95% 97%  93%  Weight:      Height:       No data found.   Intake/Output Summary (Last 24 hours) at 10/30/2023 0935 Last data filed at 10/30/2023 0900 Gross per 24 hour  Intake 1170.86 ml  Output 7200 ml  Net -6029.14 ml   Filed Weights   10/26/23 1804 10/27/23 1247  Weight: (!) 181.4 kg (!) 150.2 kg    Exam:  GEN: NAD SKIN: Warm and dry. EYES: No pallor or icterus ENT: MMM CV: RRR PULM: CTA B ABD: soft, obese, NT, +BS CNS: AAO x 3, non focal EXT: Edema of bilateral upper extremities and left lower extremity.  Right AKA.       Data Reviewed:   I have personally reviewed following labs and imaging studies:  Labs: Labs show the following:   Basic Metabolic Panel: Recent Labs  Lab 10/26/23 1817 10/27/23 0459 10/28/23 0316 10/29/23 0331 10/30/23 0518  NA 131* 132* 135 134* 134*  K 4.0 4.9 4.4 4.9 4.5  CL 94* 97* 99 101 99  CO2 24 28 26 26 28   GLUCOSE 159* 134* 104* 99 250*  BUN 32* 34* 41* 32* 19  CREATININE 1.20 1.30* 1.21 0.81 0.82  CALCIUM  8.3* 8.0* 8.1* 8.0* 8.3*  MG  --  2.2 2.5* 2.4 2.1  PHOS  --  7.3* 6.4* 4.1 2.2*   GFR Estimated Creatinine Clearance: 167.3 mL/min (by C-G formula based on SCr of 0.82 mg/dL). Liver Function Tests: Recent Labs  Lab 10/26/23 1817  AST 21  ALT 18  ALKPHOS 63  BILITOT 0.7  PROT 7.7  ALBUMIN 3.0*   No results for input(s): "LIPASE", "AMYLASE" in the last 168 hours. No results for input(s): "AMMONIA" in the last 168 hours. Coagulation profile Recent Labs  Lab 10/26/23 2136  INR 1.2    CBC: Recent Labs  Lab 10/26/23 1817 10/27/23 0459 10/28/23 0316 10/29/23 0331 10/30/23 0518  WBC 11.3* 15.6* 8.2 8.6 9.0  NEUTROABS 8.7*  --   --   --   --  HGB 10.9* 11.1* 9.7* 8.7* 9.0*  HCT 36.2* 37.4* 32.7* 28.7* 28.7*  MCV 96.5 98.7 96.5 95.3 94.4  PLT 238 298 269 278 282   Cardiac Enzymes: No results for input(s): "CKTOTAL", "CKMB", "CKMBINDEX", "TROPONINI" in the last 168 hours. BNP (last 3  results) No results for input(s): "PROBNP" in the last 8760 hours. CBG: Recent Labs  Lab 10/29/23 1356 10/29/23 1543 10/30/23 0012 10/30/23 0407 10/30/23 0738  GLUCAP 155* 159* 267* 233* 233*   D-Dimer: Recent Labs    10/27/23 1302  DDIMER 1.81*   Hgb A1c: Recent Labs    10/27/23 1302  HGBA1C 8.9*   Lipid Profile: Recent Labs    10/27/23 1302 10/28/23 0316  CHOL 129  --   HDL 21*  --   LDLCALC 84  --   TRIG 122 154*  CHOLHDL 6.1  --    Thyroid function studies: Recent Labs    10/27/23 1900  TSH 0.886  T3FREE 2.8   Anemia work up: No results for input(s): "VITAMINB12", "FOLATE", "FERRITIN", "TIBC", "IRON", "RETICCTPCT" in the last 72 hours. Sepsis Labs: Recent Labs  Lab 10/26/23 2331 10/27/23 0146 10/27/23 0459 10/27/23 1900 10/27/23 2107 10/28/23 0316 10/29/23 0331 10/30/23 0518  WBC  --   --  15.6*  --   --  8.2 8.6 9.0  LATICACIDVEN 1.8 1.0  --  0.7 1.0  --   --   --     Microbiology Recent Results (from the past 240 hours)  Blood culture (single)     Status: None (Preliminary result)   Collection Time: 10/26/23  6:17 PM   Specimen: BLOOD RIGHT HAND  Result Value Ref Range Status   Specimen Description BLOOD RIGHT HAND  Final   Special Requests   Final    BOTTLES DRAWN AEROBIC AND ANAEROBIC Blood Culture results may not be optimal due to an inadequate volume of blood received in culture bottles   Culture   Final    NO GROWTH 4 DAYS Performed at [facility], [address] [place], [place] [address]    Report Status PENDING  Incomplete  Culture, blood (single)     Status: None (Preliminary result)   Collection Time: 10/26/23  8:04 PM   Specimen: BLOOD  Result Value Ref Range Status   Specimen Description BLOOD LA  Final   Special Requests   Final    BOTTLES DRAWN AEROBIC AND ANAEROBIC Blood Culture adequate volume   Culture   Final    NO GROWTH 4 DAYS Performed at [facility], [address], [place],  [place] [address]    Report Status PENDING  Incomplete  Resp panel by RT-PCR (RSV, Flu A&B, Covid) Anterior Nasal Swab     Status: None   Collection Time: 10/27/23  9:53 AM   Specimen: Anterior Nasal Swab  Result Value Ref Range Status   SARS Coronavirus 2 by RT PCR NEGATIVE NEGATIVE Final    Comment: (NOTE) SARS-CoV-2 target nucleic acids are NOT DETECTED.  The SARS-CoV-2 RNA is generally detectable in upper respiratory specimens during the acute phase of infection. The lowest concentration of SARS-CoV-2 viral copies this assay can detect is 138 copies/mL. A negative result does not preclude SARS-Cov-2 infection and should not be used as the sole basis for treatment or other patient management decisions. A negative result may occur with  improper specimen collection/handling, submission of specimen other than nasopharyngeal swab, presence of viral mutation(s) within the areas targeted by this assay, and inadequate  number of viral copies(<138 copies/mL). A negative result must be combined with clinical observations, patient history, and epidemiological information. The expected result is Negative.  Fact Sheet for Patients:  BloggerCourse.com  Fact Sheet for Healthcare Providers:  SeriousBroker.it  This test is no t yet approved or cleared by the [place]  FDA and  has been authorized for detection and/or diagnosis of SARS-CoV-2 by FDA under an Emergency Use Authorization (EUA). This EUA will remain  in effect (meaning this test can be used) for the duration of the COVID-19 declaration under Section 564(b)(1) of the Act, 21 U.S.C.section 360bbb-3(b)(1), unless the authorization is terminated  or revoked sooner.       Influenza A by PCR NEGATIVE NEGATIVE Final   Influenza B by PCR NEGATIVE NEGATIVE Final    Comment: (NOTE) The Xpert Xpress SARS-CoV-2/FLU/RSV plus assay is intended as an aid in the diagnosis of influenza from  Nasopharyngeal swab specimens and should not be used as a sole basis for treatment. Nasal washings and aspirates are unacceptable for Xpert Xpress SARS-CoV-2/FLU/RSV testing.  Fact Sheet for Patients: BloggerCourse.com  Fact Sheet for Healthcare Providers: SeriousBroker.it  This test is not yet approved or cleared by the [place]  FDA and has been authorized for detection and/or diagnosis of SARS-CoV-2 by FDA under an Emergency Use Authorization (EUA). This EUA will remain in effect (meaning this test can be used) for the duration of the COVID-19 declaration under Section 564(b)(1) of the Act, 21 U.S.C. section 360bbb-3(b)(1), unless the authorization is terminated or revoked.     Resp Syncytial Virus by PCR NEGATIVE NEGATIVE Final    Comment: (NOTE) Fact Sheet for Patients: BloggerCourse.com  Fact Sheet for Healthcare Providers: SeriousBroker.it  This test is not yet approved or cleared by the [place]  FDA and has been authorized for detection and/or diagnosis of SARS-CoV-2 by FDA under an Emergency Use Authorization (EUA). This EUA will remain in effect (meaning this test can be used) for the duration of the COVID-19 declaration under Section 564(b)(1) of the Act, 21 U.S.C. section 360bbb-3(b)(1), unless the authorization is terminated or revoked.  Performed at [facility], [address] [place], [place] [address]   MRSA Next Gen by PCR, Nasal     Status: None   Collection Time: 10/27/23 12:59 PM   Specimen: Nasal Mucosa; Nasal Swab  Result Value Ref Range Status   MRSA by PCR Next Gen NOT DETECTED NOT DETECTED Final    Comment: (NOTE) The GeneXpert MRSA Assay (FDA approved for NASAL specimens only), is one component of a comprehensive MRSA colonization surveillance program. It is not intended to diagnose MRSA infection nor to guide or monitor  treatment for MRSA infections. Test performance is not FDA approved in patients less than 33 years old. Performed at [facility], [address] [place], [place] [address]     Procedures and diagnostic studies:  No results found.             LOS: 4 days   Royston Bekele  [facility]   Pager on [email or web address]. If 7PM-7AM, please contact night-coverage at [email or web address]     10/30/2023, 9:35 AM

## 2023-10-30 NOTE — Progress Notes (Signed)
 Progress Note    10/30/2023 7:34 AM 2 Days Post-Op  Subjective:   Martin Riddle is a 53 yo male now POD #2 from right above the knee amputation. Patient is no longer intubated or sedated.  Patient is verbalizing with me this morning.  Patient endorses he is having phantom pains and some pains to the incision site.  This is normal at this point.  Patient endorses he just hurts all over.  No other complaints.  Vitals are remained stable.   Vitals:   10/29/23 1900 10/29/23 2002  BP: (!) 153/74 (!) 162/90  Pulse: (!) 101 88  Resp: 16 16  Temp:  98.5 F (36.9 C)  SpO2: 95% 95%   Physical Exam: Cardiac:  RRR, Normal S1, S2. No  rubs, clicks or gallup's. No murmurs. Lungs: Ventilated on pressure support.  Lungs were rhonchorous throughout. Incisions: Right lower extremity AKA.  Clean dry and intact. Extremities: Right AKA dressing clean dry and intact.  No complications.  Left lower extremity with cellulitis +2 swelling.  Unable to palpate pulses.  Foot is warm to the touch. Abdomen: Positive bowel sounds throughout, soft, tender and distended. Neurologic: Intubated and sedated.  CBC    Component Value Date/Time   WBC 9.0 10/30/2023 0518   RBC 3.04 (L) 10/30/2023 0518   HGB 9.0 (L) 10/30/2023 0518   HGB 13.4 03/15/2014 0339   HCT 28.7 (L) 10/30/2023 0518   HCT 40.4 03/15/2014 0339   PLT 282 10/30/2023 0518   PLT 173 03/15/2014 0339   MCV 94.4 10/30/2023 0518   MCV 94 03/15/2014 0339   MCH 29.6 10/30/2023 0518   MCHC 31.4 10/30/2023 0518   RDW 15.3 10/30/2023 0518   RDW 13.1 03/15/2014 0339   LYMPHSABS 1.4 10/26/2023 1817   LYMPHSABS 2.2 03/12/2014 0600   MONOABS 0.9 10/26/2023 1817   MONOABS 1.3 (H) 03/12/2014 0600   EOSABS 0.1 10/26/2023 1817   EOSABS 0.0 03/12/2014 0600   BASOSABS 0.0 10/26/2023 1817   BASOSABS 0.1 03/12/2014 0600    BMET    Component Value Date/Time   NA 134 (L) 10/30/2023 0518   NA 135 (L) 03/15/2014 0339   K 4.5 10/30/2023 0518   K 3.1 (L)  03/15/2014 0339   CL 99 10/30/2023 0518   CL 104 03/15/2014 0339   CO2 28 10/30/2023 0518   CO2 24 03/15/2014 0339   GLUCOSE 250 (H) 10/30/2023 0518   GLUCOSE 127 (H) 03/15/2014 0339   BUN 19 10/30/2023 0518   BUN 9 03/15/2014 0339   CREATININE 0.82 10/30/2023 0518   CREATININE 0.64 03/15/2014 0339   CALCIUM  8.3 (L) 10/30/2023 0518   CALCIUM  8.4 (L) 03/15/2014 0339   GFRNONAA >60 10/30/2023 0518   GFRNONAA >60 03/15/2014 0339   GFRAA >60 02/28/2018 0429   GFRAA >60 03/15/2014 0339    INR    Component Value Date/Time   INR 1.2 10/26/2023 2136     Intake/Output Summary (Last 24 hours) at 10/30/2023 0734 Last data filed at 10/30/2023 4098 Gross per 24 hour  Intake 1240.48 ml  Output 5500 ml  Net -4259.52 ml     Assessment/Plan:  53 y.o. male is s/p right above the knee amputation.  2 Days Post-Op   PLAN First dressing change has been completed by vascular surgery today to right above-the-knee amputation stump.  Staples are intact no signs or symptoms of infection hematoma seroma or dehiscence noted. Patient to have daily dressing changes by nursing. Continue to work with PT/OT.  Patient needs to be out of the bed 4 hours a day.  DVT prophylaxis: ASA 81 mg daily Lipitor 20 mg daily and heparin  5000 units subcu every 8 hours   Annamaria [facility]rette [facility] 10/30/2023 7:34 AM

## 2023-10-30 NOTE — Progress Notes (Addendum)
 Daily Progress Note   Patient Name: Martin Riddle       Date: 10/30/2023 DOB: July 16, 1970  Age: 53 y.o. MRN#: [id number] Attending Physician: Sheril Dines, MD Primary Care Physician: Thomasene Flemings, NP Admit Date: 10/26/2023  Reason for Consultation/Follow-up: Establishing goals of care  Subjective: Notes and labs reviewed. In to see patient.  Patient is currently resting in bed right now, no family at bedside.  He discusses that his family did come to visit him yesterday.  Began discussions on updates regarding his care and patient quickly states he would like something for pain.  Patient quickly falls asleep during our conversation.  Patient awoke when his phone rang and answered it, but did not say anything to me further.  PMT will continue to follow for GOC.  Symptom management as per primary team.  Length of Stay: 4  Current Medications: Scheduled Meds:   acetaminophen   500 mg Oral Q6H   arformoterol  15 mcg Nebulization BID   ascorbic acid  500 mg Oral BID   aspirin   81 mg Oral Daily   atorvastatin   20 mg Oral Daily   budesonide  (PULMICORT ) nebulizer solution  0.25 mg Nebulization BID   Chlorhexidine  Gluconate Cloth  6 each Topical Daily   cyanocobalamin  1,000 mcg Intramuscular Q1200   Followed by   Cecily Cohen ON 11/03/2023] vitamin B-12  1,000 mcg Oral Daily   docusate sodium   100 mg Oral BID   famotidine   20 mg Oral Daily   heparin   5,000 Units Subcutaneous Q8H   insulin  aspart  0-5 Units Subcutaneous QHS   insulin  aspart  0-9 Units Subcutaneous TID WC   insulin  glargine-yfgn  15 Units Subcutaneous Daily   iron polysaccharides  150 mg Oral Daily   methocarbamol  (ROBAXIN ) injection  500 mg Intravenous Q6H   multivitamin with minerals  1 tablet Oral Daily   nutrition  supplement (JUVEN)  1 packet Oral BID BM   polyethylene glycol  17 g Oral Daily   pregabalin   150 mg Oral BID   Ensure Max Protein  11 oz Oral QHS   zinc  sulfate (50mg  elemental zinc )  220 mg Oral Daily    Continuous Infusions:  sodium chloride      ampicillin -sulbactam (UNASYN) IV Stopped (10/30/23 0730)   vancomycin  1,500 mg (10/30/23 1152)  PRN Meds: sodium chloride , dextrose , HYDROmorphone  (DILAUDID ) injection, ipratropium-albuterol , ondansetron  (ZOFRAN ) IV, mouth rinse, mouth rinse, mouth rinse, oxyCODONE , oxyCODONE   Physical Exam Constitutional:      Comments: No distress noted at this time.  Pulmonary:     Effort: Pulmonary effort is normal.  Skin:    General: Skin is warm and dry.  Neurological:     Mental Status: He is alert.             Vital Signs: BP (!) 126/54   Pulse 74   Temp 98.1 F (36.7 C) (Oral)   Resp 16   Ht 6\' 5"  (1.956 m)   Wt (!) 150.2 kg   SpO2 93%   BMI 39.27 kg/m  SpO2: SpO2: 93 % O2 Device: O2 Device: Nasal Cannula O2 Flow Rate: O2 Flow Rate (L/min): 2 L/min  Intake/output summary:  Intake/Output Summary (Last 24 hours) at 10/30/2023 1414 Last data filed at 10/30/2023 0900 Gross per 24 hour  Intake 740.1 ml  Output 6150 ml  Net -5409.9 ml   LBM: Last BM Date :  (Unknown.) Baseline Weight: Weight: (!) 181.4 kg Most recent weight: Weight: (!) 150.2 kg        Patient Active Problem List   Diagnosis Date Noted   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   Diabetic foot infection (HCC) 10/26/2023   HTN (hypertension) 10/26/2023   HLD (hyperlipidemia) 10/26/2023   Type 2 diabetes mellitus with peripheral neuropathy (HCC) 10/26/2023   Obesity, Class III, BMI 40-49.9 (morbid obesity) 10/26/2023   Severe sepsis (HCC) 10/26/2023   Depression 10/26/2023   Osteomyelitis of right foot (HCC) 10/26/2023   Hypoglycemia 10/26/2023   Atherosclerosis of native arteries of the extremities with ulceration (HCC) 10/20/2023   Hypertension    Failure to  attend appointment 07/07/2022   Cervical radiculopathy 01/07/2022   Lumbar radiculopathy 01/07/2022   Pain syndrome, chronic 01/07/2022   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   History of lumbar spinal fusion 01/07/2022   Chronic venous stasis dermatitis of both lower extremities    Cellulitis 05/30/2021   Multiple open wounds of lower extremity, initial encounter 05/30/2021   Respiratory failure (HCC) 02/19/2018   Severe recurrent major depression with psychotic features (HCC) 01/09/2017   Congenital flat foot 01/16/2015   DDD (degenerative disc disease), lumbar 01/16/2015   Diabetes mellitus, type 2 (HCC) 01/16/2015   Hypercholesteremia 01/16/2015   Gastro-esophageal reflux disease without esophagitis 01/16/2015   Peripheral neuropathy 01/16/2015    Palliative Care Assessment & Plan    Recommendations/Plan: Patient requesting pain medication and not in the place of discussing care and management.  PMT will attempt to follow-up at another time to talk further.  Symptom management as per primary team.    Code Status:    Code Status Orders  (From admission, onward)           Start     Ordered   10/26/23 2115  Full code  Continuous       Question:  By:  Answer:  Consent: discussion documented in EHR   10/26/23 2115           Code Status History     Date Active Date Inactive Code Status Order ID Comments User Context   05/30/2021 2008 06/01/2021 2059 Full Code [id number]  [place], Eric J, DO ED   01/10/2017 1131 01/15/2017 1433 Full Code [id number]  Evone Hoh, MD Inpatient   12/09/2015 1550 12/11/2015 1535 Full Code [id number]  Burma Carrel, MD Inpatient  01/27/2015 2007 01/30/2015 1345 Full Code [id number]  [place] Carrel, MD Inpatient       Thank you for allowing the Palliative Medicine Team to assist in the care of this patient.   Meribeth Standard, NP  Please contact Palliative Medicine Team phone at [phone] for questions and concerns.

## 2023-10-31 DIAGNOSIS — Z7189 Other specified counseling: Secondary | ICD-10-CM

## 2023-10-31 DIAGNOSIS — Z789 Other specified health status: Secondary | ICD-10-CM

## 2023-10-31 DIAGNOSIS — E162 Hypoglycemia, unspecified: Secondary | ICD-10-CM

## 2023-10-31 DIAGNOSIS — E11628 Type 2 diabetes mellitus with other skin complications: Secondary | ICD-10-CM | POA: Diagnosis not present

## 2023-10-31 DIAGNOSIS — Z515 Encounter for palliative care: Secondary | ICD-10-CM | POA: Diagnosis not present

## 2023-10-31 DIAGNOSIS — L089 Local infection of the skin and subcutaneous tissue, unspecified: Secondary | ICD-10-CM | POA: Diagnosis not present

## 2023-10-31 LAB — CBC
HCT: 27.7 % — ABNORMAL LOW (ref 39.0–52.0)
Hemoglobin: 8.6 g/dL — ABNORMAL LOW (ref 13.0–17.0)
MCH: 29.1 pg (ref 26.0–34.0)
MCHC: 31 g/dL (ref 30.0–36.0)
MCV: 93.6 fL (ref 80.0–100.0)
Platelets: 292 10*3/uL (ref 150–400)
RBC: 2.96 MIL/uL — ABNORMAL LOW (ref 4.22–5.81)
RDW: 15 % (ref 11.5–15.5)
WBC: 9.5 10*3/uL (ref 4.0–10.5)
nRBC: 0 % (ref 0.0–0.2)

## 2023-10-31 LAB — BASIC METABOLIC PANEL WITH GFR
Anion gap: 11 (ref 5–15)
BUN: 24 mg/dL — ABNORMAL HIGH (ref 6–20)
CO2: 26 mmol/L (ref 22–32)
Calcium: 8.6 mg/dL — ABNORMAL LOW (ref 8.9–10.3)
Chloride: 95 mmol/L — ABNORMAL LOW (ref 98–111)
Creatinine, Ser: 0.75 mg/dL (ref 0.61–1.24)
GFR, Estimated: >60 mL/min (ref >60)
Glucose, Bld: 295 mg/dL — ABNORMAL HIGH (ref 70–99)
Potassium: 4.7 mmol/L (ref 3.5–5.1)
Sodium: 132 mmol/L — ABNORMAL LOW (ref 135–145)

## 2023-10-31 LAB — CULTURE, BLOOD (SINGLE)
Culture: NO GROWTH
Culture: NO GROWTH
Special Requests: ADEQUATE

## 2023-10-31 LAB — GLUCOSE, CAPILLARY
Glucose-Capillary: 321 mg/dL — ABNORMAL HIGH (ref 70–99)
Glucose-Capillary: 222 mg/dL — ABNORMAL HIGH (ref 70–99)
Glucose-Capillary: 267 mg/dL — ABNORMAL HIGH (ref 70–99)
Glucose-Capillary: 329 mg/dL — ABNORMAL HIGH (ref 70–99)
Glucose-Capillary: 328 mg/dL — ABNORMAL HIGH (ref 70–99)
Glucose-Capillary: 307 mg/dL — ABNORMAL HIGH (ref 70–99)

## 2023-10-31 LAB — TRIGLYCERIDES: Triglycerides: 220 mg/dL — ABNORMAL HIGH (ref <150)

## 2023-10-31 MED ORDER — INSULIN ASPART 100 UNIT/ML IJ SOLN
0.0000 [IU] | Freq: Three times a day (TID) | INTRAMUSCULAR | Status: DC
  Administered 2023-10-31: 8 [IU] via SUBCUTANEOUS
  Administered 2023-10-31: 11 [IU] via SUBCUTANEOUS
  Administered 2023-11-01: 8 [IU] via SUBCUTANEOUS
  Administered 2023-11-01: 15 [IU] via SUBCUTANEOUS
  Administered 2023-11-01 – 2023-11-03 (×6): 11 [IU] via SUBCUTANEOUS
  Administered 2023-11-03 – 2023-11-04 (×3): 15 [IU] via SUBCUTANEOUS
  Administered 2023-11-04: 11 [IU] via SUBCUTANEOUS
  Administered 2023-11-05: 15 [IU] via SUBCUTANEOUS
  Filled 2023-10-31 (×16): qty 1

## 2023-10-31 MED ORDER — BISACODYL 10 MG RE SUPP: 10.0000 mg | Freq: Every day | RECTAL | Status: DC | PRN

## 2023-10-31 MED ORDER — INSULIN GLARGINE-YFGN 100 UNIT/ML ~~LOC~~ SOLN
30.0000 [IU] | Freq: Every day | SUBCUTANEOUS | Status: DC
  Administered 2023-10-31: 30 [IU] via SUBCUTANEOUS
  Filled 2023-10-31 (×2): qty 0.3

## 2023-10-31 MED ORDER — INSULIN ASPART 100 UNIT/ML IJ SOLN
0.0000 [IU] | Freq: Every day | INTRAMUSCULAR | Status: DC
  Administered 2023-10-31: 4 [IU] via SUBCUTANEOUS
  Administered 2023-11-01 – 2023-11-02 (×2): 5 [IU] via SUBCUTANEOUS
  Administered 2023-11-03 – 2023-11-04 (×2): 3 [IU] via SUBCUTANEOUS
  Filled 2023-10-31 (×5): qty 1

## 2023-10-31 NOTE — Progress Notes (Signed)
 Report called to Ramone, care nurse for room 231

## 2023-10-31 NOTE — Progress Notes (Signed)
 Palliative Care Progress Note, Assessment & Plan   Patient Name: Martin Riddle       Date: 10/31/2023 DOB: 07-Aug-1970  Age: 53 y.o. MRN#: [id number] Attending Physician: Sheril Dines, MD Primary Care Physician: Thomasene Flemings, NP Admit Date: 10/26/2023  Subjective: Patient complains of 8/10 R AKA site pain relieved with pain medication. He is eating most of his meals. Slept off/on last night. He has not had a BM since his admit. Patient inquires when he will be leaving ICU for regular bed.   HPI: "Martin Riddle is a 53 y.o. male with medical history significant for DM, atherosclerosis of native arteries of the extremities with ulceration, diabetic foot ulcer, HTN, HLD, depression, chronic venous stasis in both legs, pancreatitis, peripheral neuropathy, morbid obesity, who presented to the hospital with right foot wound infection.  He complained of pain in the right foot and also noticed a foul smell.  He had had the wound for more than 3 weeks.  He had been previously evaluated by Dr. Vonna Guardian, vascular surgeon, on 10/20/2023, and angiography and possible revascularization had been recommended on that visit."   He was admitted to the hospital for severe sepsis secondary to right foot infection/osteomyelitis.  He was started on empiric IV Zosyn  and vancomycin .  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   Summary of counseling/coordination of care: Extensive chart review completed prior to meeting patient including labs, vital signs, imaging, progress notes, orders, and available advanced directive documents from current and previous encounters.    After reviewing the patient's chart and assessing the patient at bedside, I spoke with patient in regards to symptom management and goals of care.   53 yo obese male, sitting upright in bed watching a movie on his iPad. He is A&O, calm and pleasant. Even, unlabored respirations. He is in no distress. Son-in-law and granddaughter at bedside.   Patient shares that he did not realize that he would still have pain after his amputation. Educated patient and son-in-law at bedside the complexity of phantom limb pain. Mr. Mciver reports that he wants to be able to walk again. Discussed the importance of rehab and working to regain strength to be able to tolerate prosthetic.    Patient shares that he wants to do what he needs to do to get back home. Educated on the importance of nutrition as well as monitoring and maintaining acceptable blood sugar levels to promote healing after his R AKA. Patient verbalizes understanding.   Patient endorses that he wants to continue FULL CODE status, wanting CPR/intubation if needed.   Therapeutic silence and active listening provided for patient to share his thoughts and emotions regarding current medical situation.  Emotional support provided.  Physical Exam Vitals reviewed.  Constitutional:      General: He is not in acute distress.    Appearance: He is obese. He is not ill-appearing.  HENT:     Head: Normocephalic and atraumatic.     Nose:     Comments: O2 via [place]    Mouth/Throat:  Mouth: Mucous membranes are moist.  Pulmonary:     Effort: Pulmonary effort is normal.  Musculoskeletal:     Comments: R AKA wrapped with ACE bandage. No bleeding/oozing noted to wrap.    Skin:    General: Skin is warm and dry.  Neurological:     Mental Status: He is alert and oriented to person, place, and time.  Psychiatric:        Mood and Affect: Mood normal.        Behavior: Behavior normal.        Thought Content: Thought content normal.        Judgment:  Judgment normal.    Recommendations/Plan: FULL CODE status as previously documented    Continue current supportive interventions D/c disposition TBD         Total Time 50 minutes   Time spent includes: Detailed review of medical records (labs, imaging, vital signs), medically appropriate exam (mental status, respiratory, cardiac, skin), discussed with treatment team, counseling and educating patient, family and staff, documenting clinical information, medication management and coordination of care.     Ina Manas, Joyice Nodal[facility] Palliative Medicine Team  10/31/2023 10:03 AM  Office [phone]  Pager [phone]

## 2023-10-31 NOTE — Progress Notes (Addendum)
 Progress Note    Martin Riddle  ZOX:[id number] DOB: [birth year]  DOA: 10/26/2023 PCP: Thomasene Flemings, NP      Brief Narrative:    Medical records reviewed and are as summarized below:  Martin Riddle is a 53 y.o. male with medical history significant for DM, atherosclerosis of native arteries of the extremities with ulceration, diabetic foot ulcer, HTN, HLD, depression, chronic venous stasis in both legs, pancreatitis, peripheral neuropathy, morbid obesity, who presented to the hospital with right foot wound infection.  He complained of pain in the right foot and also noticed a foul smell.  He had had the wound for more than 3 weeks.  He had been previously evaluated by Dr. Vonna Guardian, vascular surgeon, on 10/20/2023, and angiography and possible revascularization had been recommended on that visit.  He was admitted to the hospital for severe sepsis secondary to right foot infection/osteomyelitis.  He was started on empiric IV Zosyn  and vancomycin .   Significant Hospital Events: Including procedures, antibiotic start and stop dates in addition to other pertinent events   05/19: Pt admitted to the [facility] unit with gangrene of right foot, however he remained in the ER pending bed availability 05/20: Rapid response initiated pt minimally responsive found to be hypoglycemic CBG <10 pt received 1 amp of D50W and placed on Bipap and transported to the stepdown unit.  He was initially IVC'd by psychiatry due to concern of inability to make sound medical decisions for himself.  Pts mentation improved and he was transitioned to nasal canula with improvement in CBG's.  Psych rescinded the IVC and pt deemed competent to make medical decisions.  Unfortunately, later during the shift pt became unresponsive and hypoxic requiring mechanical intubation 10/28/23- patient scheduled for amputation today due to critical limb ischemia with gangrene of bilateral lower extermities. He remains on MV with sedation.   10/29/23- s/p right amputation.  Blood glucose stable. For bedside SLP today which he passed and diet has been initiated. He is liberated off ventilator.          Assessment/Plan:   Principal Problem:   Diabetic foot infection (HCC) Active Problems:   Osteomyelitis of right foot (HCC)   Atherosclerosis of native arteries of the extremities with ulceration (HCC)   Severe sepsis (HCC)   Type 2 diabetes mellitus with peripheral neuropathy (HCC)   HTN (hypertension)   HLD (hyperlipidemia)   Depression   Obesity, Class III, BMI 40-49.9 (morbid obesity)   Hypoglycemia   Gas gangrene (HCC)   Nutrition Problem: Inadequate oral intake Etiology: inability to eat  Signs/Symptoms: NPO status   Body mass index is 39.27 kg/m.     Acute hypoxic and hypercapnic respiratory failure: Improved.  S/p extubation on 10/29/2023.  He is on 2 L/min oxygen via [place].   Septic shock secondary to right foot osteomyelitis with gangrene of first metatarsophalangeal joint:  Shock physiology has resolved.  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 Follow-up with vascular surgeon.   AKI: Resolved Hyponatremia: Improved   Type II DM with hyperglycemia, s/p hypoglycemia: Hypoglycemia has resolved Increase Lantus  from 15 units to 30 units daily. Increase NovoLog  sliding scale from sensitive to moderate scale. Hemoglobin A1c 8.9.  Home regimen His wife said he takes Lantus  100 units but he is not sure if it is once a day or twice a day.  He also takes Humalog  25 units with meals.  She said even with this regimen, his blood  sugar runs in the 300s to 500s.  She used to be under the care of Dr. Lorelei Rogers, endocrinologist, but he was discharged from her clinic a few years ago because of medical nonadherence.   Acute toxic metabolic encephalopathy: Improved (He became unresponsive and was intubated on 10/27/2023).   General weakness: PT and OT  consulted Remove Foley catheter    Comorbidities include hypertension, hyperlipidemia, depression, OHS.  His wife said he has never been tested for sleep apnea but she suspects that patient may have sleep apnea and she had advised him to get tested but he never did.   Orders placed for transfer from ICU to progressive care unit.   Diet Order             Diet Carb Modified Fluid consistency: Thin  Diet effective now                            Consultants: Vascular surgeon Podiatrist ID specialist Intensivist  Procedures: Intubation and mechanical ventilation Right above-knee amputation on 10/28/2023    Medications:    acetaminophen   500 mg Oral Q6H   arformoterol  15 mcg Nebulization BID   ascorbic acid  500 mg Oral BID   aspirin   81 mg Oral Daily   atorvastatin   20 mg Oral Daily   budesonide  (PULMICORT ) nebulizer solution  0.25 mg Nebulization BID   Chlorhexidine  Gluconate Cloth  6 each Topical Daily   cyanocobalamin  1,000 mcg Intramuscular Q1200   Followed by   Cecily Cohen ON 11/03/2023] vitamin B-12  1,000 mcg Oral Daily   docusate sodium   100 mg Oral BID   famotidine   20 mg Oral Daily   heparin   5,000 Units Subcutaneous Q8H   insulin  aspart  0-15 Units Subcutaneous TID WC   insulin  aspart  0-5 Units Subcutaneous QHS   insulin  glargine-yfgn  30 Units Subcutaneous Daily   iron polysaccharides  150 mg Oral Daily   ketorolac   30 mg Intravenous Q6H   methocarbamol  (ROBAXIN ) injection  500 mg Intravenous Q6H   multivitamin with minerals  1 tablet Oral Daily   nutrition supplement (JUVEN)  1 packet Oral BID BM   polyethylene glycol  17 g Oral Daily   pregabalin   150 mg Oral BID   Ensure Max Protein  11 oz Oral QHS   zinc  sulfate (50mg  elemental zinc )  220 mg Oral Daily   Continuous Infusions:  ampicillin -sulbactam (UNASYN) IV 3 g (10/31/23 0233)   vancomycin  1,500 mg (10/30/23 2210)     Anti-infectives (From admission, onward)    Start      Dose/Rate Route Frequency Ordered Stop   10/29/23 2200  vancomycin  (VANCOREADY) IVPB 1500 mg/300 mL        1,500 mg 150 mL/hr over 120 Minutes Intravenous Every 12 hours 10/29/23 1639 11/01/23 2359   10/29/23 1800  Ampicillin -Sulbactam (UNASYN) 3 g in sodium chloride  0.9 % 100 mL IVPB        3 g 200 mL/hr over 30 Minutes Intravenous Every 6 hours 10/29/23 1637 11/02/23 0159   10/28/23 1100  ceFAZolin (ANCEF) IVPB 2g/100 mL premix        2 g 200 mL/hr over 30 Minutes Intravenous 30 min pre-op 10/27/23 2242 10/28/23 2130   10/27/23 2239  ceFAZolin (ANCEF) IVPB 2g/100 mL premix  Status:  Discontinued        2 g 200 mL/hr over 30 Minutes Intravenous 30 min pre-op 10/27/23 2239 10/27/23 2242  10/27/23 1000  vancomycin  (VANCOREADY) IVPB 1750 mg/350 mL  Status:  Discontinued        1,750 mg 175 mL/hr over 120 Minutes Intravenous Every 12 hours 10/26/23 2322 10/29/23 1639   10/27/23 0300  piperacillin -tazobactam (ZOSYN ) IVPB 3.375 g  Status:  Discontinued        3.375 g 12.5 mL/hr over 240 Minutes Intravenous Every 8 hours 10/26/23 2305 10/29/23 1637   10/26/23 2230  vancomycin  (VANCOCIN ) IVPB 1000 mg/200 mL premix  Status:  Discontinued       Placed in "And" Linked Group   1,000 mg 200 mL/hr over 60 Minutes Intravenous Every 24 hours 10/26/23 2048 10/27/23 1100   10/26/23 2130  piperacillin -tazobactam (ZOSYN ) IVPB 3.375 g        3.375 g 100 mL/hr over 30 Minutes Intravenous  Once 10/26/23 2041 10/26/23 2157   10/26/23 2100  vancomycin  (VANCOREADY) IVPB 1500 mg/300 mL  Status:  Discontinued       Placed in "And" Linked Group   1,500 mg 150 mL/hr over 120 Minutes Intravenous Every 24 hours 10/26/23 2048 10/27/23 1100   10/26/23 2045  vancomycin  (VANCOREADY) IVPB 2000 mg/400 mL  Status:  Discontinued        2,000 mg 200 mL/hr over 120 Minutes Intravenous  Once 10/26/23 2041 10/26/23 2048              Family Communication/Anticipated D/C date and plan/Code Status   DVT  prophylaxis: heparin  injection 5,000 Units Start: 10/26/23 2200     Code Status: Full Code  Family Communication: Plan discussed with his wife over the phone Disposition Plan: Plan to discharge to SNF   Status is: Inpatient Remains inpatient appropriate because: Right foot infection       Subjective:   Interval events noted.  He has no complaints.  He feels better.  Objective:    Vitals:   10/30/23 2000 10/30/23 2100 10/30/23 2200 10/31/23 0000  BP: 133/63 (!) 114/59 130/66 (!) 102/47  Pulse: 70 69 70 82  Resp: 19 18  16   Temp: 98.9 F (37.2 C)   98.7 F (37.1 C)  TempSrc: Axillary   Axillary  SpO2: 96% 97% 96% 95%  Weight:      Height:       No data found.   Intake/Output Summary (Last 24 hours) at 10/31/2023 0904 Last data filed at 10/30/2023 1839 Gross per 24 hour  Intake 883.35 ml  Output 2400 ml  Net -1516.65 ml   Filed Weights   10/26/23 1804 10/27/23 1247  Weight: (!) 181.4 kg (!) 150.2 kg    Exam:  GEN: NAD SKIN: Warm and dry EYES: No pallor or icterus ENT: MMM CV: RRR PULM: CTA B ABD: soft, obese, NT, +BS CNS: AAO x 3, non focal EXT: Mild edema of bilateral upper extremities and left lower extremity.  Right AKA GU: Foley catheter draining amber urine      Data Reviewed:   I have personally reviewed following labs and imaging studies:  Labs: Labs show the following:   Basic Metabolic Panel: Recent Labs  Lab 10/27/23 0459 10/28/23 0316 10/29/23 0331 10/30/23 0518 10/31/23 0322  NA 132* 135 134* 134* 132*  K 4.9 4.4 4.9 4.5 4.7  CL 97* 99 101 99 95*  CO2 28 26 26 28 26   GLUCOSE 134* 104* 99 250* 295*  BUN 34* 41* 32* 19 24*  CREATININE 1.30* 1.21 0.81 0.82 0.75  CALCIUM  8.0* 8.1* 8.0* 8.3* 8.6*  MG 2.2 2.5*  2.4 2.1  --   PHOS 7.3* 6.4* 4.1 2.2*  --    GFR Estimated Creatinine Clearance: 171.4 mL/min (by C-G formula based on SCr of 0.75 mg/dL). Liver Function Tests: Recent Labs  Lab 10/26/23 1817  AST 21  ALT 18   ALKPHOS 63  BILITOT 0.7  PROT 7.7  ALBUMIN 3.0*   No results for input(s): "LIPASE", "AMYLASE" in the last 168 hours. No results for input(s): "AMMONIA" in the last 168 hours. Coagulation profile Recent Labs  Lab 10/26/23 2136  INR 1.2    CBC: Recent Labs  Lab 10/26/23 1817 10/27/23 0459 10/28/23 0316 10/29/23 0331 10/30/23 0518 10/31/23 0322  WBC 11.3* 15.6* 8.2 8.6 9.0 9.5  NEUTROABS 8.7*  --   --   --   --   --   HGB 10.9* 11.1* 9.7* 8.7* 9.0* 8.6*  HCT 36.2* 37.4* 32.7* 28.7* 28.7* 27.7*  MCV 96.5 98.7 96.5 95.3 94.4 93.6  PLT 238 298 269 278 282 292   Cardiac Enzymes: No results for input(s): "CKTOTAL", "CKMB", "CKMBINDEX", "TROPONINI" in the last 168 hours. BNP (last 3 results) No results for input(s): "PROBNP" in the last 8760 hours. CBG: Recent Labs  Lab 10/30/23 0738 10/30/23 1103 10/30/23 1342 10/30/23 1709 10/31/23 0745  GLUCAP 233* 229* 246* 282* 321*   D-Dimer: No results for input(s): "DDIMER" in the last 72 hours.  Hgb A1c: No results for input(s): "HGBA1C" in the last 72 hours.  Lipid Profile: Recent Labs    10/31/23 0322  TRIG 220*   Thyroid function studies: No results for input(s): "TSH", "T4TOTAL", "T3FREE", "THYROIDAB" in the last 72 hours.  Invalid input(s): "FREET3"  Anemia work up: No results for input(s): "VITAMINB12", "FOLATE", "FERRITIN", "TIBC", "IRON", "RETICCTPCT" in the last 72 hours. Sepsis Labs: Recent Labs  Lab 10/26/23 2331 10/27/23 0146 10/27/23 0459 10/27/23 1900 10/27/23 2107 10/28/23 0316 10/29/23 0331 10/30/23 0518 10/31/23 0322  WBC  --   --    < >  --   --  8.2 8.6 9.0 9.5  LATICACIDVEN 1.8 1.0  --  0.7 1.0  --   --   --   --    < > = values in this interval not displayed.    Microbiology Recent Results (from the past 240 hours)  Blood culture (single)     Status: None   Collection Time: 10/26/23  6:17 PM   Specimen: BLOOD RIGHT HAND  Result Value Ref Range Status   Specimen Description  BLOOD RIGHT HAND  Final   Special Requests   Final    BOTTLES DRAWN AEROBIC AND ANAEROBIC Blood Culture results may not be optimal due to an inadequate volume of blood received in culture bottles   Culture   Final    NO GROWTH 5 DAYS Performed at [facility], [address], [place], [place] [address]    Report Status 10/31/2023 FINAL  Final  Culture, blood (single)     Status: None   Collection Time: 10/26/23  8:04 PM   Specimen: BLOOD  Result Value Ref Range Status   Specimen Description BLOOD LA  Final   Special Requests   Final    BOTTLES DRAWN AEROBIC AND ANAEROBIC Blood Culture adequate volume   Culture   Final    NO GROWTH 5 DAYS Performed at [facility], [address] [place], [place] [address]    Report Status 10/31/2023 FINAL  Final  Resp panel by RT-PCR (RSV, Flu A&B, Covid) Anterior Nasal  Swab     Status: None   Collection Time: 10/27/23  9:53 AM   Specimen: Anterior Nasal Swab  Result Value Ref Range Status   SARS Coronavirus 2 by RT PCR NEGATIVE NEGATIVE Final    Comment: (NOTE) SARS-CoV-2 target nucleic acids are NOT DETECTED.  The SARS-CoV-2 RNA is generally detectable in upper respiratory specimens during the acute phase of infection. The lowest concentration of SARS-CoV-2 viral copies this assay can detect is 138 copies/mL. A negative result does not preclude SARS-Cov-2 infection and should not be used as the sole basis for treatment or other patient management decisions. A negative result may occur with  improper specimen collection/handling, submission of specimen other than nasopharyngeal swab, presence of viral mutation(s) within the areas targeted by this assay, and inadequate number of viral copies(<138 copies/mL). A negative result must be combined with clinical observations, patient history, and epidemiological information. The expected result is Negative.  Fact Sheet for Patients:   BloggerCourse.com  Fact Sheet for Healthcare Providers:  SeriousBroker.it  This test is no t yet approved or cleared by the [place]  FDA and  has been authorized for detection and/or diagnosis of SARS-CoV-2 by FDA under an Emergency Use Authorization (EUA). This EUA will remain  in effect (meaning this test can be used) for the duration of the COVID-19 declaration under Section 564(b)(1) of the Act, 21 U.S.C.section 360bbb-3(b)(1), unless the authorization is terminated  or revoked sooner.       Influenza A by PCR NEGATIVE NEGATIVE Final   Influenza B by PCR NEGATIVE NEGATIVE Final    Comment: (NOTE) The Xpert Xpress SARS-CoV-2/FLU/RSV plus assay is intended as an aid in the diagnosis of influenza from Nasopharyngeal swab specimens and should not be used as a sole basis for treatment. Nasal washings and aspirates are unacceptable for Xpert Xpress SARS-CoV-2/FLU/RSV testing.  Fact Sheet for Patients: BloggerCourse.com  Fact Sheet for Healthcare Providers: SeriousBroker.it  This test is not yet approved or cleared by the [place]  FDA and has been authorized for detection and/or diagnosis of SARS-CoV-2 by FDA under an Emergency Use Authorization (EUA). This EUA will remain in effect (meaning this test can be used) for the duration of the COVID-19 declaration under Section 564(b)(1) of the Act, 21 U.S.C. section 360bbb-3(b)(1), unless the authorization is terminated or revoked.     Resp Syncytial Virus by PCR NEGATIVE NEGATIVE Final    Comment: (NOTE) Fact Sheet for Patients: BloggerCourse.com  Fact Sheet for Healthcare Providers: SeriousBroker.it  This test is not yet approved or cleared by the [place]  FDA and has been authorized for detection and/or diagnosis of SARS-CoV-2 by FDA under an Emergency Use  Authorization (EUA). This EUA will remain in effect (meaning this test can be used) for the duration of the COVID-19 declaration under Section 564(b)(1) of the Act, 21 U.S.C. section 360bbb-3(b)(1), unless the authorization is terminated or revoked.  Performed at [facility], [address] [place], [place] [address]   MRSA Next Gen by PCR, Nasal     Status: None   Collection Time: 10/27/23 12:59 PM   Specimen: Nasal Mucosa; Nasal Swab  Result Value Ref Range Status   MRSA by PCR Next Gen NOT DETECTED NOT DETECTED Final    Comment: (NOTE) The GeneXpert MRSA Assay (FDA approved for NASAL specimens only), is one component of a comprehensive MRSA colonization surveillance program. It is not intended to diagnose MRSA infection nor to guide or monitor treatment for MRSA infections. Test performance is not  FDA approved in patients less than 75 years old. Performed at [facility], [address] [place], [place] [address]     Procedures and diagnostic studies:  No results found.             LOS: 5 days   Ashelynn Marks  [facility]   Pager on [email or web address]. If 7PM-7AM, please contact night-coverage at [email or web address]     10/31/2023, 9:04 AM

## 2023-10-31 NOTE — Progress Notes (Signed)
 Pt transported to room 231 via bed accompanied by this RN and Larinda Plover, NT. Pt on Telemetry and O2 at 2LPM. Hand-off completed at bedside with Ramone, RN.

## 2023-10-31 NOTE — Progress Notes (Signed)
 [facility] V[facility]  Daily Progress Note   Subjective  -   Has pain (current 8/10 with best 6/10) in end of right AKA.  Pain meds OK but no great.  Better compared to yesterday.  Objective Vitals:   10/30/23 2200 10/31/23 0000 10/31/23 1000 10/31/23 1006  BP: 130/66 (!) 102/47 (!) 160/74   Pulse: 70 82  73  Resp:  16  14  Temp:  98.7 F (37.1 C) 97.9 F (36.6 C)   TempSrc:  Axillary Oral   SpO2: 96% 95% 96% 96%  Weight:      Height:        Intake/Output Summary (Last 24 hours) at 10/31/2023 1216 Last data filed at 10/31/2023 1000 Gross per 24 hour  Intake 1490.53 ml  Output 4625 ml  Net -3134.47 ml    PULM  CTAB CV  RRR VASC  Right AKA dressing clean and dry.  Left leg Asx.  Laboratory CBC    Component Value Date/Time   WBC 9.5 10/31/2023 0322   HGB 8.6 (L) 10/31/2023 0322   HGB 13.4 03/15/2014 0339   HCT 27.7 (L) 10/31/2023 0322   HCT 40.4 03/15/2014 0339   PLT 292 10/31/2023 0322   PLT 173 03/15/2014 0339    BMET    Component Value Date/Time   NA 132 (L) 10/31/2023 0322   NA 135 (L) 03/15/2014 0339   K 4.7 10/31/2023 0322   K 3.1 (L) 03/15/2014 0339   CL 95 (L) 10/31/2023 0322   CL 104 03/15/2014 0339   CO2 26 10/31/2023 0322   CO2 24 03/15/2014 0339   GLUCOSE 295 (H) 10/31/2023 0322   GLUCOSE 127 (H) 03/15/2014 0339   BUN 24 (H) 10/31/2023 0322   BUN 9 03/15/2014 0339   CREATININE 0.75 10/31/2023 0322   CREATININE 0.64 03/15/2014 0339   CALCIUM  8.6 (L) 10/31/2023 0322   CALCIUM  8.4 (L) 03/15/2014 0339   GFRNONAA >60 10/31/2023 0322   GFRNONAA >60 03/15/2014 0339   GFRAA >60 02/28/2018 0429   GFRAA >60 03/15/2014 0339    Assessment/Planning: POD #3 s/p R AKA  Appropriate level of pain.  Getting better.  Continue present management.  Needs to have a BM.   Martin Riddle  10/31/2023, 12:16 PM

## 2023-11-01 DIAGNOSIS — T148XXA Other injury of unspecified body region, initial encounter: Secondary | ICD-10-CM

## 2023-11-01 DIAGNOSIS — E11628 Type 2 diabetes mellitus with other skin complications: Secondary | ICD-10-CM | POA: Diagnosis not present

## 2023-11-01 DIAGNOSIS — L089 Local infection of the skin and subcutaneous tissue, unspecified: Secondary | ICD-10-CM | POA: Diagnosis not present

## 2023-11-01 DIAGNOSIS — Z515 Encounter for palliative care: Secondary | ICD-10-CM | POA: Diagnosis not present

## 2023-11-01 DIAGNOSIS — Z7189 Other specified counseling: Secondary | ICD-10-CM | POA: Diagnosis not present

## 2023-11-01 LAB — BASIC METABOLIC PANEL WITH GFR
Anion gap: 10 (ref 5–15)
Anion gap: 10 (ref 5–15)
BUN: 22 mg/dL — ABNORMAL HIGH (ref 6–20)
BUN: 29 mg/dL — ABNORMAL HIGH (ref 6–20)
CO2: 28 mmol/L (ref 22–32)
CO2: 26 mmol/L (ref 22–32)
Calcium: 8.7 mg/dL — ABNORMAL LOW (ref 8.9–10.3)
Calcium: 7.8 mg/dL — ABNORMAL LOW (ref 8.9–10.3)
Chloride: 95 mmol/L — ABNORMAL LOW (ref 98–111)
Chloride: 93 mmol/L — ABNORMAL LOW (ref 98–111)
Creatinine, Ser: 0.76 mg/dL (ref 0.61–1.24)
Creatinine, Ser: 1 mg/dL (ref 0.61–1.24)
GFR, Estimated: >60 mL/min (ref >60)
GFR, Estimated: >60 mL/min (ref >60)
Glucose, Bld: 323 mg/dL — ABNORMAL HIGH (ref 70–99)
Glucose, Bld: 445 mg/dL — ABNORMAL HIGH (ref 70–99)
Potassium: 4.7 mmol/L (ref 3.5–5.1)
Potassium: 4.8 mmol/L (ref 3.5–5.1)
Sodium: 133 mmol/L — ABNORMAL LOW (ref 135–145)
Sodium: 129 mmol/L — ABNORMAL LOW (ref 135–145)

## 2023-11-01 LAB — CBC
HCT: 27.6 % — ABNORMAL LOW (ref 39.0–52.0)
Hemoglobin: 8.5 g/dL — ABNORMAL LOW (ref 13.0–17.0)
MCH: 28.6 pg (ref 26.0–34.0)
MCHC: 30.8 g/dL (ref 30.0–36.0)
MCV: 92.9 fL (ref 80.0–100.0)
Platelets: 329 10*3/uL (ref 150–400)
RBC: 2.97 MIL/uL — ABNORMAL LOW (ref 4.22–5.81)
RDW: 14.7 % (ref 11.5–15.5)
WBC: 8.9 10*3/uL (ref 4.0–10.5)
nRBC: 0 % (ref 0.0–0.2)

## 2023-11-01 LAB — GLUCOSE, CAPILLARY
Glucose-Capillary: 313 mg/dL — ABNORMAL HIGH (ref 70–99)
Glucose-Capillary: 312 mg/dL — ABNORMAL HIGH (ref 70–99)
Glucose-Capillary: 364 mg/dL — ABNORMAL HIGH (ref 70–99)
Glucose-Capillary: 277 mg/dL — ABNORMAL HIGH (ref 70–99)
Glucose-Capillary: 436 mg/dL — ABNORMAL HIGH (ref 70–99)
Glucose-Capillary: 374 mg/dL — ABNORMAL HIGH (ref 70–99)

## 2023-11-01 MED ORDER — INSULIN GLARGINE-YFGN 100 UNIT/ML ~~LOC~~ SOLN
50.0000 [IU] | Freq: Every day | SUBCUTANEOUS | Status: DC
  Administered 2023-11-01: 50 [IU] via SUBCUTANEOUS
  Filled 2023-11-01 (×2): qty 0.5

## 2023-11-01 MED ORDER — INSULIN ASPART 100 UNIT/ML IJ SOLN
10.0000 [IU] | Freq: Three times a day (TID) | INTRAMUSCULAR | Status: DC
  Administered 2023-11-01 (×3): 10 [IU] via SUBCUTANEOUS
  Filled 2023-11-01 (×3): qty 1

## 2023-11-01 MED ORDER — INSULIN ASPART 100 UNIT/ML IJ SOLN
10.0000 [IU] | Freq: Once | INTRAMUSCULAR | Status: AC
  Administered 2023-11-01: 10 [IU] via INTRAVENOUS

## 2023-11-01 NOTE — Progress Notes (Addendum)
 Progress Note    Martin Riddle  UJW:[id number] DOB: [birth year]  DOA: 10/26/2023 PCP: Thomasene Flemings, NP      Brief Narrative:    Medical records reviewed and are as summarized below:  Martin Riddle is a 53 y.o. male with medical history significant for DM, atherosclerosis of native arteries of the extremities with ulceration, diabetic foot ulcer, HTN, HLD, depression, chronic venous stasis in both legs, pancreatitis, peripheral neuropathy, morbid obesity, who presented to the hospital with right foot wound infection.  He complained of pain in the right foot and also noticed a foul smell.  He had had the wound for more than 3 weeks.  He had been previously evaluated by Dr. Vonna Guardian, vascular surgeon, on 10/20/2023, and angiography and possible revascularization had been recommended on that visit.  He was admitted to the hospital for severe sepsis secondary to right foot infection/osteomyelitis.  He was started on empiric IV Zosyn  and vancomycin .   Significant Hospital Events: Including procedures, antibiotic start and stop dates in addition to other pertinent events   05/19: Pt admitted to the [facility] unit with gangrene of right foot, however he remained in the ER pending bed availability 05/20: Rapid response initiated pt minimally responsive found to be hypoglycemic CBG <10 pt received 1 amp of D50W and placed on Bipap and transported to the stepdown unit.  He was initially IVC'd by psychiatry due to concern of inability to make sound medical decisions for himself.  Pts mentation improved and he was transitioned to nasal canula with improvement in CBG's.  Psych rescinded the IVC and pt deemed competent to make medical decisions.  Unfortunately, later during the shift pt became unresponsive and hypoxic requiring mechanical intubation 10/28/23- patient scheduled for amputation today due to critical limb ischemia with gangrene of bilateral lower extermities. He remains on MV with sedation.   10/29/23- s/p right amputation.  Blood glucose stable. For bedside SLP today which he passed and diet has been initiated. He is liberated off ventilator.          Assessment/Plan:   Principal Problem:   Diabetic foot infection (HCC) Active Problems:   Osteomyelitis of right foot (HCC)   Atherosclerosis of native arteries of the extremities with ulceration (HCC)   Severe sepsis (HCC)   Type 2 diabetes mellitus with peripheral neuropathy (HCC)   HTN (hypertension)   HLD (hyperlipidemia)   Depression   Obesity, Class III, BMI 40-49.9 (morbid obesity)   Hypoglycemia   Gas gangrene (HCC)   Nutrition Problem: Inadequate oral intake Etiology: inability to eat  Signs/Symptoms: NPO status   Body mass index is 39.27 kg/m.     Acute hypoxic and hypercapnic respiratory failure: Improved.  S/p extubation on 10/29/2023.  He is on 2 L/min oxygen via [place].  Wean off oxygen as able.  Check over night pulse oximetry There are concerns that patient has OSA.  Patient said he has never been officially diagnosed but there is some suspicion he may have it.  Recommended outpatient follow-up for sleep study.   Septic shock secondary to right foot osteomyelitis with gangrene of first metatarsophalangeal joint:  Shock physiology has resolved.  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 Plan to complete IV Unasyn and vancomycin  today.   Follow-up with vascular surgeon.   AKI: Resolved Hyponatremia: Improved   Type II DM with hyperglycemia, s/p hypoglycemia: Hypoglycemia has resolved Increase Lantus  from 30 units to 50 units daily and add NovoLog  10  units 3 times daily with meals because of hyperglycemia (glucose in the 300s). Continue NovoLog  sliding scale. Hemoglobin A1c 8.9.  Home regimen His wife said he takes Lantus  100 units but he is not sure if it is once a day or twice a day.  He also takes Humalog  25 units with meals.  She said even with this  regimen, his blood sugar runs in the 300s to 500s.  She used to be under the care of Dr. Lorelei Rogers, endocrinologist, but he was discharged from her clinic a few years ago because of medical nonadherence.   Acute toxic metabolic encephalopathy: Improved (He became unresponsive and was intubated on 10/27/2023).   General weakness: PT and OT recommended inpatient rehab but it appears patient may not have a payer source for inpatient rehab.  He may have to go to SNF.  Follow-up with TOC to assist with disposition.    Comorbidities include hypertension, hyperlipidemia, depression, OHS.  His wife said he has never been tested for sleep apnea but she suspects that patient may have sleep apnea and she had advised him to get tested but he never did.      Diet Order             Diet Carb Modified Fluid consistency: Thin  Diet effective now                            Consultants: Vascular surgeon Podiatrist ID specialist Intensivist  Procedures: Intubation and mechanical ventilation Right above-knee amputation on 10/28/2023    Medications:    acetaminophen   500 mg Oral Q6H   arformoterol  15 mcg Nebulization BID   ascorbic acid  500 mg Oral BID   aspirin   81 mg Oral Daily   atorvastatin   20 mg Oral Daily   budesonide  (PULMICORT ) nebulizer solution  0.25 mg Nebulization BID   cyanocobalamin  1,000 mcg Intramuscular Q1200   Followed by   Cecily Cohen ON 11/03/2023] vitamin B-12  1,000 mcg Oral Daily   docusate sodium   100 mg Oral BID   famotidine   20 mg Oral Daily   heparin   5,000 Units Subcutaneous Q8H   insulin  aspart  0-15 Units Subcutaneous TID WC   insulin  aspart  0-5 Units Subcutaneous QHS   insulin  aspart  10 Units Subcutaneous TID WC   insulin  glargine-yfgn  50 Units Subcutaneous Daily   iron polysaccharides  150 mg Oral Daily   ketorolac   30 mg Intravenous Q6H   methocarbamol  (ROBAXIN ) injection  500 mg Intravenous Q6H   multivitamin with minerals  1 tablet Oral  Daily   nutrition supplement (JUVEN)  1 packet Oral BID BM   polyethylene glycol  17 g Oral Daily   pregabalin   150 mg Oral BID   Ensure Max Protein  11 oz Oral QHS   zinc  sulfate (50mg  elemental zinc )  220 mg Oral Daily   Continuous Infusions:  ampicillin -sulbactam (UNASYN) IV 3 g (11/01/23 0806)   vancomycin  1,500 mg (11/01/23 0947)     Anti-infectives (From admission, onward)    Start     Dose/Rate Route Frequency Ordered Stop   10/29/23 2200  vancomycin  (VANCOREADY) IVPB 1500 mg/300 mL        1,500 mg 150 mL/hr over 120 Minutes Intravenous Every 12 hours 10/29/23 1639 11/01/23 2359   10/29/23 1800  Ampicillin -Sulbactam (UNASYN) 3 g in sodium chloride  0.9 % 100 mL IVPB        3 g  200 mL/hr over 30 Minutes Intravenous Every 6 hours 10/29/23 1637 11/02/23 0159   10/28/23 1100  ceFAZolin (ANCEF) IVPB 2g/100 mL premix        2 g 200 mL/hr over 30 Minutes Intravenous 30 min pre-op 10/27/23 2242 10/28/23 2130   10/27/23 2239  ceFAZolin (ANCEF) IVPB 2g/100 mL premix  Status:  Discontinued        2 g 200 mL/hr over 30 Minutes Intravenous 30 min pre-op 10/27/23 2239 10/27/23 2242   10/27/23 1000  vancomycin  (VANCOREADY) IVPB 1750 mg/350 mL  Status:  Discontinued        1,750 mg 175 mL/hr over 120 Minutes Intravenous Every 12 hours 10/26/23 2322 10/29/23 1639   10/27/23 0300  piperacillin -tazobactam (ZOSYN ) IVPB 3.375 g  Status:  Discontinued        3.375 g 12.5 mL/hr over 240 Minutes Intravenous Every 8 hours 10/26/23 2305 10/29/23 1637   10/26/23 2230  vancomycin  (VANCOCIN ) IVPB 1000 mg/200 mL premix  Status:  Discontinued       Placed in "And" Linked Group   1,000 mg 200 mL/hr over 60 Minutes Intravenous Every 24 hours 10/26/23 2048 10/27/23 1100   10/26/23 2130  piperacillin -tazobactam (ZOSYN ) IVPB 3.375 g        3.375 g 100 mL/hr over 30 Minutes Intravenous  Once 10/26/23 2041 10/26/23 2157   10/26/23 2100  vancomycin  (VANCOREADY) IVPB 1500 mg/300 mL  Status:  Discontinued        Placed in "And" Linked Group   1,500 mg 150 mL/hr over 120 Minutes Intravenous Every 24 hours 10/26/23 2048 10/27/23 1100   10/26/23 2045  vancomycin  (VANCOREADY) IVPB 2000 mg/400 mL  Status:  Discontinued        2,000 mg 200 mL/hr over 120 Minutes Intravenous  Once 10/26/23 2041 10/26/23 2048              Family Communication/Anticipated D/C date and plan/Code Status   DVT prophylaxis: heparin  injection 5,000 Units Start: 10/26/23 2200     Code Status: Full Code  Family Communication: None Disposition Plan: Plan to discharge to SNF   Status is: Inpatient Remains inpatient appropriate because: Right foot infection       Subjective:   Interval events noted.  He complains of pain at right AKA stump.  No other complaints.  Objective:    Vitals:   10/31/23 2327 11/01/23 0343 11/01/23 0822 11/01/23 1202  BP: (!) 148/72 (!) 151/72 (!) 145/72 130/70  Pulse: 72 74 75 77  Resp: 20 20 18 18   Temp: 98.1 F (36.7 C) 98.5 F (36.9 C) 97.9 F (36.6 C) 97.8 F (36.6 C)  TempSrc: Oral  Oral   SpO2: 95% 94% 97% 96%  Weight:      Height:       No data found.   Intake/Output Summary (Last 24 hours) at 11/01/2023 1212 Last data filed at 11/01/2023 0900 Gross per 24 hour  Intake 1397.09 ml  Output 2700 ml  Net -1302.91 ml   Filed Weights   10/26/23 1804 10/27/23 1247  Weight: (!) 181.4 kg (!) 150.2 kg    Exam:  GEN: NAD SKIN: Warm and dry EYES: No pallor or icterus ENT: MMM CV: RRR PULM: CTA B ABD: soft, ND, NT, +BS CNS: AAO x 3, non focal EXT: Right AKA       Data Reviewed:   I have personally reviewed following labs and imaging studies:  Labs: Labs show the following:   Basic Metabolic Panel: Recent  Labs  Lab 10/27/23 0459 10/28/23 0316 10/29/23 0331 10/30/23 0518 10/31/23 0322 11/01/23 0359  NA 132* 135 134* 134* 132* 133*  K 4.9 4.4 4.9 4.5 4.7 4.7  CL 97* 99 101 99 95* 95*  CO2 28 26 26 28 26 28   GLUCOSE 134* 104* 99 250*  295* 323*  BUN 34* 41* 32* 19 24* 22*  CREATININE 1.30* 1.21 0.81 0.82 0.75 0.76  CALCIUM  8.0* 8.1* 8.0* 8.3* 8.6* 8.7*  MG 2.2 2.5* 2.4 2.1  --   --   PHOS 7.3* 6.4* 4.1 2.2*  --   --    GFR Estimated Creatinine Clearance: 171.4 mL/min (by C-G formula based on SCr of 0.76 mg/dL). Liver Function Tests: Recent Labs  Lab 10/26/23 1817  AST 21  ALT 18  ALKPHOS 63  BILITOT 0.7  PROT 7.7  ALBUMIN 3.0*   No results for input(s): "LIPASE", "AMYLASE" in the last 168 hours. No results for input(s): "AMMONIA" in the last 168 hours. Coagulation profile Recent Labs  Lab 10/26/23 2136  INR 1.2    CBC: Recent Labs  Lab 10/26/23 1817 10/27/23 0459 10/28/23 0316 10/29/23 0331 10/30/23 0518 10/31/23 0322 11/01/23 0359  WBC 11.3*   < > 8.2 8.6 9.0 9.5 8.9  NEUTROABS 8.7*  --   --   --   --   --   --   HGB 10.9*   < > 9.7* 8.7* 9.0* 8.6* 8.5*  HCT 36.2*   < > 32.7* 28.7* 28.7* 27.7* 27.6*  MCV 96.5   < > 96.5 95.3 94.4 93.6 92.9  PLT 238   < > 269 278 282 292 329   < > = values in this interval not displayed.   Cardiac Enzymes: No results for input(s): "CKTOTAL", "CKMB", "CKMBINDEX", "TROPONINI" in the last 168 hours. BNP (last 3 results) No results for input(s): "PROBNP" in the last 8760 hours. CBG: Recent Labs  Lab 10/31/23 1957 10/31/23 2327 11/01/23 0343 11/01/23 0809 11/01/23 1134  GLUCAP 328* 307* 313* 312* 364*   D-Dimer: No results for input(s): "DDIMER" in the last 72 hours.  Hgb A1c: No results for input(s): "HGBA1C" in the last 72 hours.  Lipid Profile: Recent Labs    10/31/23 0322  TRIG 220*   Thyroid function studies: No results for input(s): "TSH", "T4TOTAL", "T3FREE", "THYROIDAB" in the last 72 hours.  Invalid input(s): "FREET3"  Anemia work up: No results for input(s): "VITAMINB12", "FOLATE", "FERRITIN", "TIBC", "IRON", "RETICCTPCT" in the last 72 hours. Sepsis Labs: Recent Labs  Lab 10/26/23 2331 10/27/23 0146 10/27/23 0459  10/27/23 1900 10/27/23 2107 10/28/23 0316 10/29/23 0331 10/30/23 0518 10/31/23 0322 11/01/23 0359  WBC  --   --    < >  --   --    < > 8.6 9.0 9.5 8.9  LATICACIDVEN 1.8 1.0  --  0.7 1.0  --   --   --   --   --    < > = values in this interval not displayed.    Microbiology Recent Results (from the past 240 hours)  Blood culture (single)     Status: None   Collection Time: 10/26/23  6:17 PM   Specimen: BLOOD RIGHT HAND  Result Value Ref Range Status   Specimen Description BLOOD RIGHT HAND  Final   Special Requests   Final    BOTTLES DRAWN AEROBIC AND ANAEROBIC Blood Culture results may not be optimal due to an inadequate volume of blood received in culture bottles  Culture   Final    NO GROWTH 5 DAYS Performed at [facility], [address] [place], [place] [address]    Report Status 10/31/2023 FINAL  Final  Culture, blood (single)     Status: None   Collection Time: 10/26/23  8:04 PM   Specimen: BLOOD  Result Value Ref Range Status   Specimen Description BLOOD LA  Final   Special Requests   Final    BOTTLES DRAWN AEROBIC AND ANAEROBIC Blood Culture adequate volume   Culture   Final    NO GROWTH 5 DAYS Performed at [facility], [address] [place], [place] [address]    Report Status 10/31/2023 FINAL  Final  Resp panel by RT-PCR (RSV, Flu A&B, Covid) Anterior Nasal Swab     Status: None   Collection Time: 10/27/23  9:53 AM   Specimen: Anterior Nasal Swab  Result Value Ref Range Status   SARS Coronavirus 2 by RT PCR NEGATIVE NEGATIVE Final    Comment: (NOTE) SARS-CoV-2 target nucleic acids are NOT DETECTED.  The SARS-CoV-2 RNA is generally detectable in upper respiratory specimens during the acute phase of infection. The lowest concentration of SARS-CoV-2 viral copies this assay can detect is 138 copies/mL. A negative result does not preclude SARS-Cov-2 infection and should not be used as the sole basis for treatment or other patient  management decisions. A negative result may occur with  improper specimen collection/handling, submission of specimen other than nasopharyngeal swab, presence of viral mutation(s) within the areas targeted by this assay, and inadequate number of viral copies(<138 copies/mL). A negative result must be combined with clinical observations, patient history, and epidemiological information. The expected result is Negative.  Fact Sheet for Patients:  BloggerCourse.com  Fact Sheet for Healthcare Providers:  SeriousBroker.it  This test is no t yet approved or cleared by the [place]  FDA and  has been authorized for detection and/or diagnosis of SARS-CoV-2 by FDA under an Emergency Use Authorization (EUA). This EUA will remain  in effect (meaning this test can be used) for the duration of the COVID-19 declaration under Section 564(b)(1) of the Act, 21 U.S.C.section 360bbb-3(b)(1), unless the authorization is terminated  or revoked sooner.       Influenza A by PCR NEGATIVE NEGATIVE Final   Influenza B by PCR NEGATIVE NEGATIVE Final    Comment: (NOTE) The Xpert Xpress SARS-CoV-2/FLU/RSV plus assay is intended as an aid in the diagnosis of influenza from Nasopharyngeal swab specimens and should not be used as a sole basis for treatment. Nasal washings and aspirates are unacceptable for Xpert Xpress SARS-CoV-2/FLU/RSV testing.  Fact Sheet for Patients: BloggerCourse.com  Fact Sheet for Healthcare Providers: SeriousBroker.it  This test is not yet approved or cleared by the [place]  FDA and has been authorized for detection and/or diagnosis of SARS-CoV-2 by FDA under an Emergency Use Authorization (EUA). This EUA will remain in effect (meaning this test can be used) for the duration of the COVID-19 declaration under Section 564(b)(1) of the Act, 21 U.S.C. section 360bbb-3(b)(1),  unless the authorization is terminated or revoked.     Resp Syncytial Virus by PCR NEGATIVE NEGATIVE Final    Comment: (NOTE) Fact Sheet for Patients: BloggerCourse.com  Fact Sheet for Healthcare Providers: SeriousBroker.it  This test is not yet approved or cleared by the [place]  FDA and has been authorized for detection and/or diagnosis of SARS-CoV-2 by FDA under an Emergency Use Authorization (EUA). This EUA will remain in effect (meaning this test  can be used) for the duration of the COVID-19 declaration under Section 564(b)(1) of the Act, 21 U.S.C. section 360bbb-3(b)(1), unless the authorization is terminated or revoked.  Performed at [facility], [address] [place], [place] [address]   MRSA Next Gen by PCR, Nasal     Status: None   Collection Time: 10/27/23 12:59 PM   Specimen: Nasal Mucosa; Nasal Swab  Result Value Ref Range Status   MRSA by PCR Next Gen NOT DETECTED NOT DETECTED Final    Comment: (NOTE) The GeneXpert MRSA Assay (FDA approved for NASAL specimens only), is one component of a comprehensive MRSA colonization surveillance program. It is not intended to diagnose MRSA infection nor to guide or monitor treatment for MRSA infections. Test performance is not FDA approved in patients less than 33 years old. Performed at [facility], [address] [place], [place] [address]     Procedures and diagnostic studies:  No results found.             LOS: 6 days   Lilliann Rossetti  [facility]   Pager on [email or web address]. If 7PM-7AM, please contact night-coverage at [email or web address]     11/01/2023, 12:12 PM

## 2023-11-01 NOTE — Evaluation (Signed)
 Occupational Therapy Evaluation Patient Details Name: Martin Riddle MRN: [id number] DOB: 1970/09/23 Today's Date: 11/01/2023   History of Present Illness   Pt is a 53 y.o. 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 RR called 5/20 with pt unresponsive requiring intubation, off vent 5/22. PMH significant for DM, atherosclerosis of native arteries of the extremities with ulceration, diabetic foot ulcer, HTN, HLD, depression, chronic venous stasis in both legs, pancreatitis, peripheral neuropathy, & morbid obesity.     Clinical Impressions Prior to hospital admission, pt used a walking stick for mobility and required occasional assist for LB dressing. Pt lives with spouse. Pt currently requires supervision and increased time for bed mobility, sits EOB with close supervision and endorses 7/10 pain in R residual limb. MAX A to don socks seated. Performs 4x lateral scoots supervision level - expresses need for BM, pt placed on bedpan. Will need drop arm bariatric BSC for future toileting needs, anticipate +2 for functional transfers either with bari RW or sliding board.   Pt would benefit from skilled OT services to address noted impairments and functional limitations (see below for any additional details) in order to maximize safety and independence while minimizing falls risk and caregiver burden. Anticipate the need for follow up OT services upon acute hospital DC. Patient will benefit from continued inpatient follow up therapy, <3 hours/day.      If plan is discharge home, recommend the following:   Two people to help with walking and/or transfers;Two people to help with bathing/dressing/bathroom;Assist for transportation;Help with stairs or ramp for entrance     Functional Status Assessment   Patient has had a recent decline in their functional status and demonstrates the ability to make significant improvements in function in a  reasonable and predictable amount of time.     Equipment Recommendations   None recommended by OT (defer to next LOC)      Precautions/Restrictions   Precautions Precautions: Fall Recall of Precautions/Restrictions: Impaired Restrictions Weight Bearing Restrictions Per Provider Order: Yes RLE Weight Bearing Per Provider Order: Non weight bearing Other Position/Activity Restrictions: NWB'ing R residual limb     Mobility Bed Mobility Overal bed mobility: Needs Assistance Bed Mobility: Rolling, Sit to Sidelying, Supine to Sit Rolling: Supervision, Used rails   Supine to sit: Supervision, HOB elevated, Used rails   Sit to sidelying: Supervision, Used rails General bed mobility comments: pt requiring increased time and effort    Transfers Overall transfer level: Needs assistance Equipment used: None Transfers: Sit to/from Stand, Bed to chair/wheelchair/BSC Sit to Stand: From elevated surface          Lateral/Scoot Transfers: Supervision General transfer comment: unable to stand from elevated bed height with +2 (will require bari RW and potential sliding board for lateral scoot transfers)      Balance Overall balance assessment: Needs assistance Sitting-balance support: Feet supported, No upper extremity supported Sitting balance-Leahy Scale: Fair Sitting balance - Comments: sat EOB with supervision, good tolerance to sitting                                   ADL either performed or assessed with clinical judgement   ADL Overall ADL's : Needs assistance/impaired Eating/Feeding: Sitting;Independent                   Lower Body Dressing: Maximal assistance;Sitting/lateral leans   Toilet  Transfer: Requires wide/bariatric;Requires drop arm;BSC/3in1 Toilet Transfer Details (indicate cue type and reason): unable to formally assess this date secondary to pain and need for bari equipment. bed pan for toileting needs at this time Toileting-  Architect and Hygiene: Total assistance;Bed level       Functional mobility during ADLs: +2 for safety/equipment;+2 for physical assistance (will need bari RW) General ADL Comments: max-total A for toileting bed level, max A lateral leans for LB dressing and up to minA for UB based on body habitus     Vision Baseline Vision/History: 1 Wears glasses Ability to See in Adequate Light: 0 Adequate Patient Visual Report: No change from baseline Vision Assessment?: Wears glasses for reading            Pertinent Vitals/Pain Pain Assessment Pain Assessment: 0-10 Pain Score: 7  Pain Location: R residual limb Pain Descriptors / Indicators: Sore Pain Intervention(s): Limited activity within patient's tolerance, Monitored during session, Premedicated before session     Extremity/Trunk Assessment Upper Extremity Assessment Upper Extremity Assessment: Right hand dominant;Overall [facility] for tasks assessed (good BUE strength, 4/5 MMT)   Lower Extremity Assessment Lower Extremity Assessment: Defer to PT evaluation RLE Deficits / Details: R residual limb with ACE wrap dressing, clean and intact. NWB'ing RLE: Unable to fully assess due to pain RLE Sensation: decreased light touch;decreased proprioception RLE Coordination: decreased gross motor   Cervical / Trunk Assessment Cervical / Trunk Assessment: Other exceptions Cervical / Trunk Exceptions: body habitus   Communication Communication Communication: No apparent difficulties   Cognition Arousal: Lethargic Behavior During Therapy: WFL for tasks assessed/performed                                 Following commands: Impaired Following commands impaired: Follows one step commands with increased time, Follows multi-step commands inconsistently     Cueing  General Comments   Cueing Techniques: Verbal cues;Tactile cues  RLE wrapped in ace bandage, O2 on 2L 93% end of session           Home Living  Family/patient expects to be discharged to:: Private residence Living Arrangements: Spouse/significant other Available Help at Discharge: Family Type of Home: House Home Access: Stairs to enter Secretary/administrator of Steps: 6 Entrance Stairs-Rails: Right;Left;Can reach both Home Layout: One level     Bathroom Shower/Tub: Tub/shower unit;Walk-in Human resources officer:  (low toilets) Bathroom Accessibility: Yes   Home Equipment: Crutches          Prior Functioning/Environment Prior Level of Function : Independent/Modified Independent             Mobility Comments: ambulated with use of a "walking stick" PRN ADLs Comments: pt reporting he was previously able to complete ADLs with min A from wife for LB dressing    OT Problem List: Decreased strength;Decreased range of motion;Decreased activity tolerance;Impaired balance (sitting and/or standing);Pain;Obesity;Increased edema;Impaired sensation;Decreased knowledge of precautions;Decreased knowledge of use of DME or AE   OT Treatment/Interventions: Self-care/ADL training;Therapeutic exercise;Energy conservation;DME and/or AE instruction;Therapeutic activities;Patient/family education;Balance training      OT Goals(Current goals can be found in the care plan section)   Acute Rehab OT Goals OT Goal Formulation: With patient Time For Goal Achievement: 11/15/23 Potential to Achieve Goals: Fair   OT Frequency:  Min 3X/week    Co-evaluation PT/OT/SLP Co-Evaluation/Treatment: Yes Reason for Co-Treatment: For patient/therapist safety;To address functional/ADL transfers;Complexity of the patient's impairments (multi-system involvement) PT goals addressed during  session: Mobility/safety with mobility;Balance;Proper use of DME;Strengthening/ROM        AM-PAC OT "6 Clicks" Daily Activity     Outcome Measure Help from another person eating meals?: None Help from another person taking care of personal grooming?: None Help  from another person toileting, which includes using toliet, bedpan, or urinal?: Total Help from another person bathing (including washing, rinsing, drying)?: Total Help from another person to put on and taking off regular upper body clothing?: A Lot Help from another person to put on and taking off regular lower body clothing?: Total 6 Click Score: 13   End of Session Equipment Utilized During Treatment: Gait belt;Rolling walker (2 wheels);Oxygen Nurse Communication: Mobility status;Other (comment) (need for bari equip in room, pt on bed pan)  Activity Tolerance: Patient tolerated treatment well Patient left: in bed;Other (comment);with call bell/phone within reach;with bed alarm set;with family/visitor present (on bed pan, RN notified)  OT Visit Diagnosis: Pain;Other abnormalities of gait and mobility (R26.89);Unsteadiness on feet (R26.81);Muscle weakness (generalized) (M62.81) Pain - Right/Left: Right Pain - part of body: Leg                Time: 2595-6387 OT Time Calculation (min): 33 min Charges:  OT General Charges $OT Visit: 1 Visit OT Evaluation $OT Eval Moderate Complexity: 1 Mod  Matthews Franks L. Deanthony Maull, OTR/L  11/01/23, 12:51 PM

## 2023-11-01 NOTE — Progress Notes (Signed)
 [name]  Daily Progress Note   Subjective  -   No complaints.  Pain in stump is improved.  No BM.  Eating well.  Objective Vitals:   10/31/23 2327 11/01/23 0343 11/01/23 0822 11/01/23 1202  BP: (!) 148/72 (!) 151/72 (!) 145/72 130/70  Pulse: 72 74 75 77  Resp: 20 20 18 18   Temp: 98.1 F (36.7 C) 98.5 F (36.9 C) 97.9 F (36.6 C) 97.8 F (36.6 C)  TempSrc: Oral  Oral   SpO2: 95% 94% 97% 96%  Weight:      Height:        Intake/Output Summary (Last 24 hours) at 11/01/2023 1425 Last data filed at 11/01/2023 0900 Gross per 24 hour  Intake 1397.09 ml  Output 2350 ml  Net -952.91 ml    PULM  CTAB CV  RRR VASC  Dressing falling off stump.  Staple line is intact and dry with no redness.  Laboratory CBC    Component Value Date/Time   WBC 8.9 11/01/2023 0359   HGB 8.5 (L) 11/01/2023 0359   HGB 13.4 03/15/2014 0339   HCT 27.6 (L) 11/01/2023 0359   HCT 40.4 03/15/2014 0339   PLT 329 11/01/2023 0359   PLT 173 03/15/2014 0339    BMET    Component Value Date/Time   NA 133 (L) 11/01/2023 0359   NA 135 (L) 03/15/2014 0339   K 4.7 11/01/2023 0359   K 3.1 (L) 03/15/2014 0339   CL 95 (L) 11/01/2023 0359   CL 104 03/15/2014 0339   CO2 28 11/01/2023 0359   CO2 24 03/15/2014 0339   GLUCOSE 323 (H) 11/01/2023 0359   GLUCOSE 127 (H) 03/15/2014 0339   BUN 22 (H) 11/01/2023 0359   BUN 9 03/15/2014 0339   CREATININE 0.76 11/01/2023 0359   CREATININE 0.64 03/15/2014 0339   CALCIUM  8.7 (L) 11/01/2023 0359   CALCIUM  8.4 (L) 03/15/2014 0339   GFRNONAA >60 11/01/2023 0359   GFRNONAA >60 03/15/2014 0339   GFRAA >60 02/28/2018 0429   GFRAA >60 03/15/2014 0339    Assessment/Planning: POD #4 s/p R AKA  Will get adhesive drape to secure dressing on AKA stump.   Martin Riddle  11/01/2023, 2:25 PM

## 2023-11-01 NOTE — Plan of Care (Signed)
  Problem: Education: Goal: Ability to describe self-care measures that may prevent or decrease complications (Diabetes Survival Skills Education) will improve Outcome: Progressing Goal: Individualized Educational Video(s) Outcome: Progressing   Problem: Coping: Goal: Ability to adjust to condition or change in health will improve Outcome: Progressing   Problem: Fluid Volume: Goal: Ability to maintain a balanced intake and output will improve Outcome: Progressing   Problem: Health Behavior/Discharge Planning: Goal: Ability to identify and utilize available resources and services will improve Outcome: Progressing Goal: Ability to manage health-related needs will improve Outcome: Progressing   Problem: Metabolic: Goal: Ability to maintain appropriate glucose levels will improve Outcome: Progressing   Problem: Nutritional: Goal: Maintenance of adequate nutrition will improve Outcome: Progressing Goal: Progress toward achieving an optimal weight will improve Outcome: Progressing   Problem: Skin Integrity: Goal: Risk for impaired skin integrity will decrease Outcome: Progressing   Problem: Tissue Perfusion: Goal: Adequacy of tissue perfusion will improve Outcome: Progressing   Problem: Clinical Measurements: Goal: Ability to avoid or minimize complications of infection will improve Outcome: Progressing   Problem: Skin Integrity: Goal: Skin integrity will improve Outcome: Progressing   Problem: Education: Goal: Knowledge of General Education information will improve Description: Including pain rating scale, medication(s)/side effects and non-pharmacologic comfort measures Outcome: Progressing   Problem: Health Behavior/Discharge Planning: Goal: Ability to manage health-related needs will improve Outcome: Progressing   Problem: Clinical Measurements: Goal: Ability to maintain clinical measurements within normal limits will improve Outcome: Progressing Goal: Will  remain free from infection Outcome: Progressing Goal: Diagnostic test results will improve Outcome: Progressing Goal: Respiratory complications will improve Outcome: Progressing Goal: Cardiovascular complication will be avoided Outcome: Progressing   Problem: Activity: Goal: Risk for activity intolerance will decrease Outcome: Progressing   Problem: Nutrition: Goal: Adequate nutrition will be maintained Outcome: Progressing   Problem: Coping: Goal: Level of anxiety will decrease Outcome: Progressing   Problem: Elimination: Goal: Will not experience complications related to bowel motility Outcome: Progressing Goal: Will not experience complications related to urinary retention Outcome: Progressing   Problem: Pain Managment: Goal: General experience of comfort will improve and/or be controlled Outcome: Progressing   Problem: Safety: Goal: Ability to remain free from injury will improve Outcome: Progressing   Problem: Skin Integrity: Goal: Risk for impaired skin integrity will decrease Outcome: Progressing

## 2023-11-01 NOTE — Progress Notes (Addendum)
 Physical Therapy Evaluation Patient Details Name: Martin Riddle MRN: [id number] DOB: [birth year] Today's Date: 11/01/2023  History of Present Illness  Pt is a 53 y.o. male with medical history significant for DM, atherosclerosis of native arteries of the extremities with ulceration, diabetic foot ulcer, HTN, HLD, depression, chronic venous stasis in both legs, pancreatitis, peripheral neuropathy, morbid obesity, who presented to the hospital with right foot wound infection.  He complained of pain in the right foot and also noticed a foul smell.  He had had the wound for more than 3 weeks.  He had been previously evaluated by Dr. Vonna Guardian, vascular surgeon, on 10/20/2023, and angiography and possible revascularization had been recommended on that visit."     He was admitted to the hospital for severe sepsis secondary to right foot infection/osteomyelitis.  He was started on empiric IV Zosyn  and vancomycin . 10/27/23 Became unresponsive/hypoxic requiring intubation. 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 10/29/23 Liberated from MV.   Clinical Impression  Pt presented supine in bed with HOB elevated, awake and willing to participate in therapy session. Pt's wife was present throughout. Pt reported that prior to admission he was independent with functional mobility and ADLs with the exception of min A needed from wife for LB dressing occasionally. Pt lives with his wife in a single level home with 6 steps to enter (with rails). At the time of evaluation, pt limited in functional mobility secondary to post-op pain and weakness despite being pre-medicated. He was able to complete bed mobility with supervision and lateral scoot transfers with supervision and cueing. Pt was on 4L of supplemental O2 with VSS throughout. Based on pt's current functional mobility status, PT recommending further intensive therapy services (>3 hrs/day) to maximize his safety and independence with functional mobility  prior to returning home with family assistance when medically ready to d/c. Pt would continue to benefit from skilled physical therapy services at this time while admitted and after d/c to address the below listed limitations in order to improve overall safety and independence with functional mobility.         If plan is discharge home, recommend the following: Two people to help with walking and/or transfers;Two people to help with bathing/dressing/bathroom;Direct supervision/assist for medications management;Assist for transportation;Help with stairs or ramp for entrance;Supervision due to cognitive status   Can travel by private vehicle   No    Equipment Recommendations Other (comment) (defer to next venue of care)  Recommendations for Other Services       Functional Status Assessment Patient has had a recent decline in their functional status and demonstrates the ability to make significant improvements in function in a reasonable and predictable amount of time.     Precautions / Restrictions Precautions Precautions: Fall Recall of Precautions/Restrictions: Impaired Restrictions Weight Bearing Restrictions Per Provider Order: Yes RLE Weight Bearing Per Provider Order: Non weight bearing Other Position/Activity Restrictions: NWB'ing R residual limb      Mobility  Bed Mobility Overal bed mobility: Needs Assistance Bed Mobility: Rolling, Sit to Sidelying, Supine to Sit Rolling: Supervision, Used rails   Supine to sit: Supervision, HOB elevated, Used rails   Sit to sidelying: Supervision, Used rails General bed mobility comments: pt requiring increased time and effort    Transfers Overall transfer level: Needs assistance                 General transfer comment: initially pt attempting a sit<>stand transfer from EOB with RW in place and PT/OT  positioned on either side; however, unable to successfully clear buttocks from bed and uanble to safely utilize the standard  RW present in room due to the width and the risk of his R residual limb bumping into the frame of the RW. Pt was then able to complete lateral scoots (4-5) towards his R side towards the [facility] with supervision and cueing for safety.    Ambulation/Gait               General Gait Details: unable to assess on eval  Stairs            Wheelchair Mobility     Tilt Bed    Modified Rankin (Stroke Patients Only)       Balance Overall balance assessment: Needs assistance Sitting-balance support: Feet supported, No upper extremity supported Sitting balance-Leahy Scale: Fair                                       Pertinent Vitals/Pain Pain Assessment Pain Assessment: 0-10 Pain Score: 7  Pain Location: R residual limb Pain Descriptors / Indicators: Sore Pain Intervention(s): Monitored during session, Premedicated before session, Repositioned    Home Living Family/patient expects to be discharged to:: Private residence Living Arrangements: Spouse/significant other Available Help at Discharge: Family Type of Home: House Home Access: Stairs to enter Entrance Stairs-Rails: Right;Left;Can reach both Secretary/administrator of Steps: 6   Home Layout: One level Home Equipment: Crutches ("walking stick")      Prior Function Prior Level of Function : Independent/Modified Independent             Mobility Comments: ambulated with use of a "walking stick" PRN ADLs Comments: pt reporting he was previously able to complete ADLs with min A from wife for LB dressing     Extremity/Trunk Assessment   Upper Extremity Assessment Upper Extremity Assessment: Defer to OT evaluation;Overall WFL for tasks assessed    Lower Extremity Assessment Lower Extremity Assessment: RLE deficits/detail RLE Deficits / Details: R residual limb with ACE wrap dressing, clean and intact. NWB'ing RLE: Unable to fully assess due to pain RLE Sensation: decreased light touch;decreased  proprioception RLE Coordination: decreased gross motor    Cervical / Trunk Assessment Cervical / Trunk Assessment: Other exceptions Cervical / Trunk Exceptions: body habitus  Communication   Communication Communication: No apparent difficulties    Cognition Arousal: Lethargic Behavior During Therapy: WFL for tasks assessed/performed   PT - Cognitive impairments: Awareness, Memory, Attention, Initiation, Sequencing, Problem solving, Safety/Judgement                       PT - Cognition Comments: Pt intermittently lethargic and slow to respond at times, requiring verbal and tactile cueing for motor planning and sequencing Following commands: Impaired Following commands impaired: Follows one step commands with increased time, Follows multi-step commands inconsistently     Cueing Cueing Techniques: Verbal cues, Tactile cues     General Comments      Exercises Other Exercises Other Exercises: PT demonstrated and instructed pt in gentle tactile desensitization exercises for pain/phantom limb pain management   Assessment/Plan    PT Assessment Patient needs continued PT services  PT Problem List Decreased strength;Decreased range of motion;Decreased balance;Decreased activity tolerance;Decreased mobility;Decreased coordination;Decreased knowledge of use of DME;Decreased safety awareness;Decreased cognition;Decreased knowledge of precautions;Pain       PT Treatment Interventions DME instruction;Gait training;Functional mobility training;Therapeutic activities;Therapeutic exercise;Balance  training;Neuromuscular re-education;Cognitive remediation;Patient/family education;Wheelchair mobility training    PT Goals (Current goals can be found in the Care Plan section)  Acute Rehab PT Goals Patient Stated Goal: decrease pain, stand and walk again PT Goal Formulation: With patient/family Time For Goal Achievement: 11/15/23 Potential to Achieve Goals: Fair    Frequency Min  5X/week     Co-evaluation PT/OT/SLP Co-Evaluation/Treatment: Yes Reason for Co-Treatment: For patient/therapist safety;To address functional/ADL transfers;Complexity of the patient's impairments (multi-system involvement) PT goals addressed during session: Mobility/safety with mobility;Balance;Proper use of DME;Strengthening/ROM         AM-PAC PT "6 Clicks" Mobility  Outcome Measure Help needed turning from your back to your side while in a flat bed without using bedrails?: None Help needed moving from lying on your back to sitting on the side of a flat bed without using bedrails?: None Help needed moving to and from a bed to a chair (including a wheelchair)?: Total Help needed standing up from a chair using your arms (e.g., wheelchair or bedside chair)?: Total Help needed to walk in hospital room?: Total Help needed climbing 3-5 steps with a railing? : Total 6 Click Score: 12    End of Session Equipment Utilized During Treatment: Gait belt;Oxygen Activity Tolerance: Patient limited by fatigue;Patient limited by pain Patient left: in bed;with call bell/phone within reach;with bed alarm set;with family/visitor present Nurse Communication: Mobility status;Need for lift equipment;Other (comment) (need for bari equipment (bari 3-in-1 with drop arm, bari recliner chair with drop arm)) PT Visit Diagnosis: Other abnormalities of gait and mobility (R26.89);Pain Pain - Right/Left: Right Pain - part of body: Leg    Time: 0852-0925 PT Time Calculation (min) (ACUTE ONLY): 33 min   Charges:   PT Evaluation $PT Eval Moderate Complexity: 1 Mod   PT General Charges $$ ACUTE PT VISIT: 1 Visit         Classie Cruise, DPT  Acute Rehabilitation Services Office [phone]   Leory Rands Darrin Koman 11/01/2023, 12:09 PM

## 2023-11-01 NOTE — Progress Notes (Signed)
 Palliative Care Progress Note, Assessment & Plan   Patient Name: Martin Riddle       Date: 11/01/2023 DOB: [birth year]  Age: 53 y.o. MRN#: [id number] Attending Physician: Sheril Dines, MD Primary Care Physician: Thomasene Flemings, NP Admit Date: 10/26/2023  Subjective: 7/10 pain at R AKA site. Did not sleep well last night. Denies CP/SOB. Appetite comes and goes. No BM since admit, but shares that he just drank Miralax . Reports surgical dressing was changed yesterday, tolerated well.   HPI: "Martin Riddle is a 53 y.o. male with medical history significant for DM, atherosclerosis of native arteries of the extremities with ulceration, diabetic foot ulcer, HTN, HLD, depression, chronic venous stasis in both legs, pancreatitis, peripheral neuropathy, morbid obesity, who presented to the hospital with right foot wound infection.  He complained of pain in the right foot and also noticed a foul smell.  He had had the wound for more than 3 weeks.  He had been previously evaluated by Dr. Vonna Guardian, vascular surgeon, on 10/20/2023, and angiography and possible revascularization had been recommended on that visit."   He was admitted to the hospital for severe sepsis secondary to right foot infection/osteomyelitis.  He was started on empiric IV Zosyn  and vancomycin .   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   Summary of counseling/coordination of care: Extensive chart review completed prior to meeting patient including labs, vital signs, imaging, progress notes, orders, and available advanced directive documents from current and previous encounters.   After  reviewing the patient's chart and assessing the patient at bedside, I spoke with patient in regards to symptom management and goals of care.   53 yo obese male, sitting upright in bed with family member sleeping at bedside. Even, unlabored respirations. He is A&O, calm and pleasant. He is in no distress. Pt nodded off numerous times during visit, but easily awakens to verbal stimuli.   Therapeutic silence and active listening provided for patient to share his thoughts and emotions regarding current medical situation.  Emotional support provided.  Physical Exam Constitutional:      General: He is not in acute distress.    Appearance: He is obese. He is not ill-appearing.  HENT:     Head: Normocephalic and atraumatic.     Mouth/Throat:     Mouth: Mucous membranes are moist.  Pulmonary:     Effort: Pulmonary effort is normal. No respiratory distress.  Abdominal:     General: There is distension.     Tenderness: There is no abdominal tenderness. There is no guarding.  Musculoskeletal:     Comments: R AKA with dressing to surgical site  Skin:    General: Skin is warm and dry.  Neurological:     Mental Status: He is alert and oriented to person, place, and time.  Psychiatric:        Mood and Affect: Mood normal.        Behavior: Behavior normal.            Recommendations/Plan: FULL CODE status as previously documented  Continue current supportive interventions D/c disposition TBD         Total Time 35 minutes   Time spent includes: Detailed review of medical records (labs, imaging, vital signs), medically appropriate exam (mental status, respiratory, cardiac, skin), discussed with treatment team, counseling and educating patient, family and staff, documenting clinical information, medication management and coordination of care.     Ina Manas, Joyice Nodal- [facility] Palliative Medicine Team  11/01/2023 8:18 AM  Office [phone]  Pager [phone]

## 2023-11-01 NOTE — Care Management Important Message (Signed)
 Important Message  Patient Details  Name: Martin Riddle MRN: [id number] Date of Birth: [birth year]   Important Message Given:  Yes - Medicare IM     Anise Kerns 11/01/2023, 12:56 PM

## 2023-11-01 NOTE — Progress Notes (Signed)
 Inpatient Rehab Admissions Coordinator:   Per therapy recommendations, patient was screened for CIR candidacy by Wandalee Gust, MS, CCC-SLP. At this time, Pt. does not appear to demonstrate a medical diagnosis that I believe payor will approve for CIR admit. I will not pursue a rehab consult for this Pt.   Recommend other rehab venues to be pursued.  Please contact me with any questions.  Wandalee Gust, MS, CCC-SLP Rehab Admissions Coordinator  [phone] (celll) [phone] (office)

## 2023-11-02 DIAGNOSIS — E11628 Type 2 diabetes mellitus with other skin complications: Secondary | ICD-10-CM | POA: Diagnosis not present

## 2023-11-02 DIAGNOSIS — L089 Local infection of the skin and subcutaneous tissue, unspecified: Secondary | ICD-10-CM | POA: Diagnosis not present

## 2023-11-02 DIAGNOSIS — T148XXA Other injury of unspecified body region, initial encounter: Secondary | ICD-10-CM | POA: Diagnosis not present

## 2023-11-02 DIAGNOSIS — Z515 Encounter for palliative care: Secondary | ICD-10-CM | POA: Diagnosis not present

## 2023-11-02 DIAGNOSIS — Z7189 Other specified counseling: Secondary | ICD-10-CM | POA: Diagnosis not present

## 2023-11-02 LAB — BLOOD GAS, ARTERIAL
Acid-Base Excess: 7.5 mmol/L — ABNORMAL HIGH (ref 0.0–2.0)
Bicarbonate: 32.6 mmol/L — ABNORMAL HIGH (ref 20.0–28.0)
FIO2: 0.21 %
O2 Saturation: 95.1 %
Patient temperature: 37
pCO2 arterial: 48 mmHg (ref 32–48)
pH, Arterial: 7.44 (ref 7.35–7.45)
pO2, Arterial: 68 mmHg — ABNORMAL LOW (ref 83–108)

## 2023-11-02 LAB — GLUCOSE, CAPILLARY
Glucose-Capillary: 301 mg/dL — ABNORMAL HIGH (ref 70–99)
Glucose-Capillary: 350 mg/dL — ABNORMAL HIGH (ref 70–99)
Glucose-Capillary: 379 mg/dL — ABNORMAL HIGH (ref 70–99)
Glucose-Capillary: 330 mg/dL — ABNORMAL HIGH (ref 70–99)
Glucose-Capillary: 304 mg/dL — ABNORMAL HIGH (ref 70–99)
Glucose-Capillary: 393 mg/dL — ABNORMAL HIGH (ref 70–99)

## 2023-11-02 MED ORDER — INSULIN ASPART 100 UNIT/ML IJ SOLN
15.0000 [IU] | Freq: Three times a day (TID) | INTRAMUSCULAR | Status: DC
  Administered 2023-11-02 (×3): 15 [IU] via SUBCUTANEOUS
  Filled 2023-11-02 (×3): qty 1

## 2023-11-02 MED ORDER — INSULIN GLARGINE-YFGN 100 UNIT/ML ~~LOC~~ SOLN
60.0000 [IU] | Freq: Every day | SUBCUTANEOUS | Status: DC
  Administered 2023-11-02 – 2023-11-03 (×2): 60 [IU] via SUBCUTANEOUS
  Filled 2023-11-02 (×2): qty 0.6

## 2023-11-02 NOTE — Progress Notes (Signed)
 [facility]  Daily Progress Note   Subjective  -   Stump painful but again improved from yesterday.  Objective Vitals:   11/02/23 0058 11/02/23 0556 11/02/23 0733 11/02/23 0809  BP: (!) 146/65 (!) 158/77 (!) 164/90 (!) 149/73  Pulse: 82 84 87 83  Resp: 20 19 18 17   Temp: 99 F (37.2 C) 97.9 F (36.6 C) 97.8 F (36.6 C) 98.5 F (36.9 C)  TempSrc: Oral Oral Oral Oral  SpO2: 92% 98% 94% 96%  Weight:      Height:        Intake/Output Summary (Last 24 hours) at 11/02/2023 1235 Last data filed at 11/02/2023 1047 Gross per 24 hour  Intake 1180 ml  Output 3525 ml  Net -2345 ml    PULM  CTAB CV  RRR VASC  Dressing intact and perfectly clean.  Laboratory CBC    Component Value Date/Time   WBC 8.9 11/01/2023 0359   HGB 8.5 (L) 11/01/2023 0359   HGB 13.4 03/15/2014 0339   HCT 27.6 (L) 11/01/2023 0359   HCT 40.4 03/15/2014 0339   PLT 329 11/01/2023 0359   PLT 173 03/15/2014 0339    BMET    Component Value Date/Time   NA 129 (L) 11/01/2023 2139   NA 135 (L) 03/15/2014 0339   K 4.8 11/01/2023 2139   K 3.1 (L) 03/15/2014 0339   CL 93 (L) 11/01/2023 2139   CL 104 03/15/2014 0339   CO2 26 11/01/2023 2139   CO2 24 03/15/2014 0339   GLUCOSE 445 (H) 11/01/2023 2139   GLUCOSE 127 (H) 03/15/2014 0339   BUN 29 (H) 11/01/2023 2139   BUN 9 03/15/2014 0339   CREATININE 1.00 11/01/2023 2139   CREATININE 0.64 03/15/2014 0339   CALCIUM  7.8 (L) 11/01/2023 2139   CALCIUM  8.4 (L) 03/15/2014 0339   GFRNONAA >60 11/01/2023 2139   GFRNONAA >60 03/15/2014 0339   GFRAA >60 02/28/2018 0429   GFRAA >60 03/15/2014 0339    Assessment/Planning: POD #5 s/p R AKA  Improving pain.  Dressing looks perfect and needs to stay on for days as it is effort intensive to replace.  Incision looked fine at yesterday change.   [facility]  11/02/2023, 12:35 PM

## 2023-11-02 NOTE — Progress Notes (Signed)
 Notified by CN to call report to 1a. The RN is unavailable at this time. Provided phone number for call back. Notified CN Amy of above.

## 2023-11-02 NOTE — TOC Progression Note (Signed)
 Transition of Care [facility]) - Progression Note    Patient Details  Name: Naasir Carreira MRN: [id number] Date of Birth: [birth year]  Transition [facility] Care [facility]) CM/SW Contact  Crayton Docker, RN 11/02/2023, 12:22 PM  Clinical Narrative:     CM to patient's room regarding discharge care planning. Hospital day 7 with diagnosis of Diabetic foot infection.  CM and patient discussed discharge care planning. Per patient is unsure regarding acute rehab and requests time to discuss possible SNF vs home health option with his wife when she arrives for a visit.   Expected Discharge Plan:  (TBD) Barriers to Discharge: No Barriers Identified  Expected Discharge Plan and Services   Discharge Planning Services: CM Consult Post Acute Care Choice:  (Pending PT/OT evaluations.) Living arrangements for the past 2 months: Single Family Home                   Social Determinants of Health (SDOH) Interventions SDOH Screenings   Food Insecurity: Patient Declined (10/27/2023)  Housing: Patient Declined (10/27/2023)  Transportation Needs: Patient Declined (10/27/2023)  Utilities: Patient Declined (10/27/2023)  Alcohol Screen: Low Risk  (05/08/2017)  Social Connections: Patient Declined (10/27/2023)  Tobacco Use: High Risk (10/26/2023)    Readmission Risk Interventions     No data to display

## 2023-11-02 NOTE — Progress Notes (Addendum)
 Progress Note    Martin Riddle  HYQ:[id number] DOB: 14-Jul-1970  DOA: 10/26/2023 PCP: Thomasene Flemings, NP      Brief Narrative:    Medical records reviewed and are as summarized below:  Martin Riddle is a 53 y.o. male with medical history significant for DM, atherosclerosis of native arteries of the extremities with ulceration, diabetic foot ulcer, HTN, HLD, depression, chronic venous stasis in both legs, pancreatitis, peripheral neuropathy, morbid obesity, who presented to the hospital with right foot wound infection.  He complained of pain in the right foot and also noticed a foul smell.  He had had the wound for more than 3 weeks.  He had been previously evaluated by Dr. Vonna Guardian, vascular surgeon, on 10/20/2023, and angiography and possible revascularization had been recommended on that visit.  He was admitted to the hospital for severe sepsis secondary to right foot infection/osteomyelitis.  He was started on empiric IV Zosyn  and vancomycin .   Significant Hospital Events: Including procedures, antibiotic start and stop dates in addition to other pertinent events   05/19: Pt admitted to the [facility] unit with gangrene of right foot, however he remained in the ER pending bed availability 05/20: Rapid response initiated pt minimally responsive found to be hypoglycemic CBG <10 pt received 1 amp of D50W and placed on Bipap and transported to the stepdown unit.  He was initially IVC'd by psychiatry due to concern of inability to make sound medical decisions for himself.  Pts mentation improved and he was transitioned to nasal canula with improvement in CBG's.  Psych rescinded the IVC and pt deemed competent to make medical decisions.  Unfortunately, later during the shift pt became unresponsive and hypoxic requiring mechanical intubation 10/28/23- patient scheduled for amputation today due to critical limb ischemia with gangrene of bilateral lower extermities. He remains on MV with sedation.   10/29/23- s/p right amputation.  Blood glucose stable. For bedside SLP today which he passed and diet has been initiated. He is liberated off ventilator.          Assessment/Plan:   Principal Problem:   Diabetic foot infection (HCC) Active Problems:   Osteomyelitis of right foot (HCC)   Atherosclerosis of native arteries of the extremities with ulceration (HCC)   Severe sepsis (HCC)   Type 2 diabetes mellitus with peripheral neuropathy (HCC)   HTN (hypertension)   HLD (hyperlipidemia)   Depression   Obesity, Class III, BMI 40-49.9 (morbid obesity)   Hypoglycemia   Gas gangrene (HCC)   Nutrition Problem: Inadequate oral intake Etiology: inability to eat  Signs/Symptoms: NPO status   Body mass index is 39.27 kg/m.     Acute hypoxic and hypercapnic respiratory failure: Improved.  S/p extubation on 10/29/2023.  He is on 2 L/min oxygen via [place].  Wean off oxygen as able.  Check over night pulse oximetry There are concerns that patient has OSA.  Patient said he has never been officially diagnosed but there is some suspicion he may have it.   Overnight oximetry showed that oxygen saturation was 82 to 89% for the duration of 2 hours and 15 minutes, 67 to 79% for duration of 12 minutes, 60 to 69% for the duration of 11 minutes. ABG on room air showed pH 7.44, pCO2 48, pO2 68. Recommend nighttime oxygen.  He does not qualify for BiPAP or trilogy machine based on ABG results. Recommended outpatient follow-up for sleep study.   Septic shock secondary to right foot osteomyelitis with gangrene of  first metatarsophalangeal joint:  Shock physiology has resolved.  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 Completed IV Unasyn and vancomycin  on 11/02/2023. Follow-up with vascular surgeon.   AKI: Resolved Hyponatremia: Fluctuating sodium levels.  Some of this may be from hyperglycemia.   Type II DM with hyperglycemia, s/p hypoglycemia:  Hypoglycemia has resolved Increase Lantus  from 50 units to 60 units daily. Increase NovoLog  from 10 units to 15 units 3 times daily with meals. glucose is still in the 300s. Continue NovoLog  sliding scale. Hemoglobin A1c 8.9.  Home regimen His wife said he takes Lantus  100 units but he is not sure if it is once a day or twice a day.  He also takes Humalog  25 units with meals.  She said even with this regimen, his blood sugar runs in the 300s to 500s.  She used to be under the care of Dr. Lorelei Rogers, endocrinologist, but he was discharged from her clinic a few years ago because of medical nonadherence.   Acute toxic metabolic encephalopathy: Improved (He became unresponsive and was intubated on 10/27/2023).   General weakness: PT and OT recommended inpatient rehab but it appears patient may not have a payer source for inpatient rehab.  He may have to go to SNF.  Follow-up with TOC to assist with disposition.    Comorbidities include hypertension, hyperlipidemia, depression, OHS.  His wife said he has never been tested for sleep apnea but she suspects that patient may have sleep apnea and she had advised him to get tested but he never did.      Diet Order             Diet Carb Modified Fluid consistency: Thin  Diet effective now                            Consultants: Vascular surgeon Podiatrist ID specialist Intensivist  Procedures: Intubation and mechanical ventilation Right above-knee amputation on 10/28/2023    Medications:    acetaminophen   500 mg Oral Q6H   arformoterol  15 mcg Nebulization BID   ascorbic acid  500 mg Oral BID   aspirin   81 mg Oral Daily   atorvastatin   20 mg Oral Daily   budesonide  (PULMICORT ) nebulizer solution  0.25 mg Nebulization BID   [START ON 11/03/2023] vitamin B-12  1,000 mcg Oral Daily   docusate sodium   100 mg Oral BID   famotidine   20 mg Oral Daily   heparin   5,000 Units Subcutaneous Q8H   insulin  aspart  0-15 Units  Subcutaneous TID WC   insulin  aspart  0-5 Units Subcutaneous QHS   insulin  aspart  15 Units Subcutaneous TID WC   insulin  glargine-yfgn  60 Units Subcutaneous Daily   iron polysaccharides  150 mg Oral Daily   methocarbamol  (ROBAXIN ) injection  500 mg Intravenous Q6H   multivitamin with minerals  1 tablet Oral Daily   nutrition supplement (JUVEN)  1 packet Oral BID BM   polyethylene glycol  17 g Oral Daily   pregabalin   150 mg Oral BID   Ensure Max Protein  11 oz Oral QHS   zinc  sulfate (50mg  elemental zinc )  220 mg Oral Daily   Continuous Infusions:     Anti-infectives (From admission, onward)    Start     Dose/Rate Route Frequency Ordered Stop   10/29/23 2200  vancomycin  (VANCOREADY) IVPB 1500 mg/300 mL        1,500 mg  150 mL/hr over 120 Minutes Intravenous Every 12 hours 10/29/23 1639 11/02/23 0735   10/29/23 1800  Ampicillin -Sulbactam (UNASYN ) 3 g in sodium chloride  0.9 % 100 mL IVPB        3 g 200 mL/hr over 30 Minutes Intravenous Every 6 hours 10/29/23 1637 11/02/23 0736   10/28/23 1100  ceFAZolin  (ANCEF ) IVPB 2g/100 mL premix        2 g 200 mL/hr over 30 Minutes Intravenous 30 min pre-op 10/27/23 2242 10/28/23 2130   10/27/23 2239  ceFAZolin  (ANCEF ) IVPB 2g/100 mL premix  Status:  Discontinued        2 g 200 mL/hr over 30 Minutes Intravenous 30 min pre-op 10/27/23 2239 10/27/23 2242   10/27/23 1000  vancomycin  (VANCOREADY) IVPB 1750 mg/350 mL  Status:  Discontinued        1,750 mg 175 mL/hr over 120 Minutes Intravenous Every 12 hours 10/26/23 2322 10/29/23 1639   10/27/23 0300  piperacillin -tazobactam (ZOSYN ) IVPB 3.375 g  Status:  Discontinued        3.375 g 12.5 mL/hr over 240 Minutes Intravenous Every 8 hours 10/26/23 2305 10/29/23 1637   10/26/23 2230  vancomycin  (VANCOCIN ) IVPB 1000 mg/200 mL premix  Status:  Discontinued       Placed in "And" Linked Group   1,000 mg 200 mL/hr over 60 Minutes Intravenous Every 24 hours 10/26/23 2048 10/27/23 1100   10/26/23 2130   piperacillin -tazobactam (ZOSYN ) IVPB 3.375 g        3.375 g 100 mL/hr over 30 Minutes Intravenous  Once 10/26/23 2041 10/26/23 2157   10/26/23 2100  vancomycin  (VANCOREADY) IVPB 1500 mg/300 mL  Status:  Discontinued       Placed in "And" Linked Group   1,500 mg 150 mL/hr over 120 Minutes Intravenous Every 24 hours 10/26/23 2048 10/27/23 1100   10/26/23 2045  vancomycin  (VANCOREADY) IVPB 2000 mg/400 mL  Status:  Discontinued        2,000 mg 200 mL/hr over 120 Minutes Intravenous  Once 10/26/23 2041 10/26/23 2048              Family Communication/Anticipated D/C date and plan/Code Status   DVT prophylaxis: heparin  injection 5,000 Units Start: 10/26/23 2200     Code Status: Full Code  Family Communication: None Disposition Plan: Plan to discharge to SNF   Status is: Inpatient Remains inpatient appropriate because: Right foot infection       Subjective:   Interval events noted.  He has no complaints.  He is tolerating room air and is not short of breath after oxygen was taken off.  Objective:    Vitals:   11/02/23 0556 11/02/23 0733 11/02/23 0809 11/02/23 1503  BP: (!) 158/77 (!) 164/90 (!) 149/73 (!) 157/84  Pulse: 84 87 83 83  Resp: 19 18 17 16   Temp: 97.9 F (36.6 C) 97.8 F (36.6 C) 98.5 F (36.9 C) 97.9 F (36.6 C)  TempSrc: Oral Oral Oral   SpO2: 98% 94% 96% 93%  Weight:      Height:       No data found.   Intake/Output Summary (Last 24 hours) at 11/02/2023 1627 Last data filed at 11/02/2023 1047 Gross per 24 hour  Intake 1180 ml  Output 3525 ml  Net -2345 ml   Filed Weights   10/26/23 1804 10/27/23 1247  Weight: (!) 181.4 kg (!) 150.2 kg    Exam:  GEN: NAD SKIN: Warm and dry EYES: No pallor or icterus ENT: MMM CV:  RRR PULM: CTA B ABD: soft, obese, NT, +BS CNS: AAO x 3, non focal EXT: Right AKA        Data Reviewed:   I have personally reviewed following labs and imaging studies:  Labs: Labs show the following:    Basic Metabolic Panel: Recent Labs  Lab 10/27/23 0459 10/28/23 0316 10/29/23 0331 10/30/23 0518 10/31/23 0322 11/01/23 0359 11/01/23 2139  NA 132* 135 134* 134* 132* 133* 129*  K 4.9 4.4 4.9 4.5 4.7 4.7 4.8  CL 97* 99 101 99 95* 95* 93*  CO2 28 26 26 28 26 28 26   GLUCOSE 134* 104* 99 250* 295* 323* 445*  BUN 34* 41* 32* 19 24* 22* 29*  CREATININE 1.30* 1.21 0.81 0.82 0.75 0.76 1.00  CALCIUM  8.0* 8.1* 8.0* 8.3* 8.6* 8.7* 7.8*  MG 2.2 2.5* 2.4 2.1  --   --   --   PHOS 7.3* 6.4* 4.1 2.2*  --   --   --    GFR Estimated Creatinine Clearance: 137.1 mL/min (by C-G formula based on SCr of 1 mg/dL). Liver Function Tests: Recent Labs  Lab 10/26/23 1817  AST 21  ALT 18  ALKPHOS 63  BILITOT 0.7  PROT 7.7  ALBUMIN 3.0*   No results for input(s): "LIPASE", "AMYLASE" in the last 168 hours. No results for input(s): "AMMONIA" in the last 168 hours. Coagulation profile Recent Labs  Lab 10/26/23 2136  INR 1.2    CBC: Recent Labs  Lab 10/26/23 1817 10/27/23 0459 10/28/23 0316 10/29/23 0331 10/30/23 0518 10/31/23 0322 11/01/23 0359  WBC 11.3*   < > 8.2 8.6 9.0 9.5 8.9  NEUTROABS 8.7*  --   --   --   --   --   --   HGB 10.9*   < > 9.7* 8.7* 9.0* 8.6* 8.5*  HCT 36.2*   < > 32.7* 28.7* 28.7* 27.7* 27.6*  MCV 96.5   < > 96.5 95.3 94.4 93.6 92.9  PLT 238   < > 269 278 282 292 329   < > = values in this interval not displayed.   Cardiac Enzymes: No results for input(s): "CKTOTAL", "CKMB", "CKMBINDEX", "TROPONINI" in the last 168 hours. BNP (last 3 results) No results for input(s): "PROBNP" in the last 8760 hours. CBG: Recent Labs  Lab 11/01/23 2255 11/02/23 0417 11/02/23 0755 11/02/23 0825 11/02/23 1145  GLUCAP 374* 301* 379* 350* 330*   D-Dimer: No results for input(s): "DDIMER" in the last 72 hours.  Hgb A1c: No results for input(s): "HGBA1C" in the last 72 hours.  Lipid Profile: Recent Labs    10/31/23 0322  TRIG 220*   Thyroid function studies: No  results for input(s): "TSH", "T4TOTAL", "T3FREE", "THYROIDAB" in the last 72 hours.  Invalid input(s): "FREET3"  Anemia work up: No results for input(s): "VITAMINB12", "FOLATE", "FERRITIN", "TIBC", "IRON", "RETICCTPCT" in the last 72 hours. Sepsis Labs: Recent Labs  Lab 10/26/23 2331 10/27/23 0146 10/27/23 0459 10/27/23 1900 10/27/23 2107 10/28/23 0316 10/29/23 0331 10/30/23 0518 10/31/23 0322 11/01/23 0359  WBC  --   --    < >  --   --    < > 8.6 9.0 9.5 8.9  LATICACIDVEN 1.8 1.0  --  0.7 1.0  --   --   --   --   --    < > = values in this interval not displayed.    Microbiology Recent Results (from the past 240 hours)  Blood culture (single)  Status: None   Collection Time: 10/26/23  6:17 PM   Specimen: BLOOD RIGHT HAND  Result Value Ref Range Status   Specimen Description BLOOD RIGHT HAND  Final   Special Requests   Final    BOTTLES DRAWN AEROBIC AND ANAEROBIC Blood Culture results may not be optimal due to an inadequate volume of blood received in culture bottles   Culture   Final    NO GROWTH 5 DAYS Performed at [facility], [address], [place], [place] [address]    Report Status 10/31/2023 FINAL  Final  Culture, blood (single)     Status: None   Collection Time: 10/26/23  8:04 PM   Specimen: BLOOD  Result Value Ref Range Status   Specimen Description BLOOD LA  Final   Special Requests   Final    BOTTLES DRAWN AEROBIC AND ANAEROBIC Blood Culture adequate volume   Culture   Final    NO GROWTH 5 DAYS Performed at [facility], [address] [place], [place] [address]    Report Status 10/31/2023 FINAL  Final  Resp panel by RT-PCR (RSV, Flu A&B, Covid) Anterior Nasal Swab     Status: None   Collection Time: 10/27/23  9:53 AM   Specimen: Anterior Nasal Swab  Result Value Ref Range Status   SARS Coronavirus 2 by RT PCR NEGATIVE NEGATIVE Final    Comment: (NOTE) SARS-CoV-2 target nucleic acids are NOT DETECTED.  The SARS-CoV-2 RNA  is generally detectable in upper respiratory specimens during the acute phase of infection. The lowest concentration of SARS-CoV-2 viral copies this assay can detect is 138 copies/mL. A negative result does not preclude SARS-Cov-2 infection and should not be used as the sole basis for treatment or other patient management decisions. A negative result may occur with  improper specimen collection/handling, submission of specimen other than nasopharyngeal swab, presence of viral mutation(s) within the areas targeted by this assay, and inadequate number of viral copies(<138 copies/mL). A negative result must be combined with clinical observations, patient history, and epidemiological information. The expected result is Negative.  Fact Sheet for Patients:  BloggerCourse.com  Fact Sheet for Healthcare Providers:  SeriousBroker.it  This test is no t yet approved or cleared by the [place]  FDA and  has been authorized for detection and/or diagnosis of SARS-CoV-2 by FDA under an Emergency Use Authorization (EUA). This EUA will remain  in effect (meaning this test can be used) for the duration of the COVID-19 declaration under Section 564(b)(1) of the Act, 21 U.S.C.section 360bbb-3(b)(1), unless the authorization is terminated  or revoked sooner.       Influenza A by PCR NEGATIVE NEGATIVE Final   Influenza B by PCR NEGATIVE NEGATIVE Final    Comment: (NOTE) The Xpert Xpress SARS-CoV-2/FLU/RSV plus assay is intended as an aid in the diagnosis of influenza from Nasopharyngeal swab specimens and should not be used as a sole basis for treatment. Nasal washings and aspirates are unacceptable for Xpert Xpress SARS-CoV-2/FLU/RSV testing.  Fact Sheet for Patients: BloggerCourse.com  Fact Sheet for Healthcare Providers: SeriousBroker.it  This test is not yet approved or cleared by the  [place]  FDA and has been authorized for detection and/or diagnosis of SARS-CoV-2 by FDA under an Emergency Use Authorization (EUA). This EUA will remain in effect (meaning this test can be used) for the duration of the COVID-19 declaration under Section 564(b)(1) of the Act, 21 U.S.C. section 360bbb-3(b)(1), unless the authorization is terminated or revoked.  Resp Syncytial Virus by PCR NEGATIVE NEGATIVE Final    Comment: (NOTE) Fact Sheet for Patients: BloggerCourse.com  Fact Sheet for Healthcare Providers: SeriousBroker.it  This test is not yet approved or cleared by the [place]  FDA and has been authorized for detection and/or diagnosis of SARS-CoV-2 by FDA under an Emergency Use Authorization (EUA). This EUA will remain in effect (meaning this test can be used) for the duration of the COVID-19 declaration under Section 564(b)(1) of the Act, 21 U.S.C. section 360bbb-3(b)(1), unless the authorization is terminated or revoked.  Performed at [facility], [address] [place], [place] [address]   MRSA Next Gen by PCR, Nasal     Status: None   Collection Time: 10/27/23 12:59 PM   Specimen: Nasal Mucosa; Nasal Swab  Result Value Ref Range Status   MRSA by PCR Next Gen NOT DETECTED NOT DETECTED Final    Comment: (NOTE) The GeneXpert MRSA Assay (FDA approved for NASAL specimens only), is one component of a comprehensive MRSA colonization surveillance program. It is not intended to diagnose MRSA infection nor to guide or monitor treatment for MRSA infections. Test performance is not FDA approved in patients less than 25 years old. Performed at [facility], [address] [place], [place] [address]     Procedures and diagnostic studies:  No results found.             LOS: 7 days   Kenyada Hy  [facility]   Pager on [email or web address]. If 7PM-7AM, please contact  night-coverage at [email or web address]     11/02/2023, 4:27 PM

## 2023-11-02 NOTE — Progress Notes (Signed)
 Report called to Narciso Backers for room 134. Questions asked and answered. Pt notified of transport. Receiving RN asked for bariatric BSC to be brought down as well. Requested transport for above.

## 2023-11-02 NOTE — Progress Notes (Signed)
 Palliative Care Progress Note, Assessment & Plan   Patient Name: Martin Riddle       Date: 11/02/2023 DOB: 12-Jul-1970  Age: 53 y.o. MRN#: [id number] Attending Physician: Sheril Dines, MD Primary Care Physician: Thomasene Flemings, NP Admit Date: 10/26/2023  Subjective: Reports 7/10 pain at surgical site today. Ate breakfast. Did not sleep well. Has not had BM since admit, but was given Miralax . No other complaints.   HPI: "Martin Riddle is a 53 y.o. male with medical history significant for DM, atherosclerosis of native arteries of the extremities with ulceration, diabetic foot ulcer, HTN, HLD, depression, chronic venous stasis in both legs, pancreatitis, peripheral neuropathy, morbid obesity, who presented to the hospital with right foot wound infection.  He complained of pain in the right foot and also noticed a foul smell.  He had had the wound for more than 3 weeks.  He had been previously evaluated by Dr. Vonna Guardian, vascular surgeon, on 10/20/2023, and angiography and possible revascularization had been recommended on that visit."   He was admitted to the hospital for severe sepsis secondary to right foot infection/osteomyelitis.  He was started on empiric IV Zosyn  and vancomycin .   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   Summary of counseling/coordination of care: Extensive chart review completed prior to meeting patient including labs, vital signs, imaging, progress notes, orders, and available advanced directive documents from current and previous encounters.   After reviewing the patient's chart and assessing the patient at bedside, I  spoke with patient in regards to symptom management and goals of care.   53 yo obese male sitting up on EOB watching movie on his tablet. He is A&O, calm and pleasant. Even, unlabored respirations. He is in no distress.   Therapeutic silence and active listening provided for patient to share his thoughts and emotions regarding current medical situation.  Emotional support provided.  Physical Exam Vitals reviewed.  Constitutional:      General: He is not in acute distress.    Appearance: He is not ill-appearing.  HENT:     Head: Normocephalic and atraumatic.     Mouth/Throat:     Mouth: Mucous membranes are moist.  Pulmonary:     Effort: Pulmonary effort is normal. No respiratory distress.  Musculoskeletal:     Comments: Dressing to R AKA site. No oozing, bleeding noted  Skin:    General: Skin is warm and dry.  Neurological:     Mental Status: He is alert and oriented to person, place, and time.  Psychiatric:        Mood and Affect: Mood normal.        Behavior: Behavior normal.        Thought Content: Thought content normal.        Judgment: Judgment normal.    Recommendations/Plan: FULL CODE status as previously documented    Continue current supportive interventions D/c disposition TBD           Total Time 50 minutes   Time spent includes: Detailed review of medical  records (labs, imaging, vital signs), medically appropriate exam (mental status, respiratory, cardiac, skin), discussed with treatment team, counseling and educating patient, family and staff, documenting clinical information, medication management and coordination of care.     Ina Manas, [facility] Palliative Medicine Team  11/02/2023 8:48 AM  Office [phone]  Pager [phone]

## 2023-11-02 NOTE — Progress Notes (Signed)
 Physical Therapy Treatment Patient Details Name: Martin Riddle MRN: [id number] DOB: 07-04-1970 Today's Date: 11/02/2023   History of Present Illness Pt is a 53 y.o. male s/p R AKA on 10/28/23 after presenting to [facility] with severe sepsis due to R foot wound infection. RR called 5/20 with pt unresponsive requiring intubation, off vent 5/22. PMH significant for DM, atherosclerosis of native arteries of the extremities with ulceration, diabetic foot ulcer, HTN, HLD, depression, chronic venous stasis in both legs, pancreatitis, peripheral neuropathy, & morbid obesity.    PT Comments  Pt was sitting in recliner with supportive family member at bedside. He is A and O x 4 and premedicated for pain prior. Session was not limited by pain but was limited by strength. Pt attempted standing 3 x from recliner. Easily able to clear hips/buttocks but unable to fully achieve upright erect posture. " I'm just so frustrated I can't even stand up all the way." Lengthy discussion about staying positive and focusing on the what he needs to do to improve his abilities. Author educated pt on phantom limb pain and importance of there ex to promote strengthening. Issued and perform HEP, along with theraband to focus on triceps strengthening/ BLE strengthening. Overall pt tolerated session well and remains extremely motivated. DC recs remain appropriate to maximize independence and safety with all ADLs.  Reach out to [facility] member to discuss re-evaluating into possible admission.     If plan is discharge home, recommend the following: Two people to help with walking and/or transfers;Two people to help with bathing/dressing/bathroom;Direct supervision/assist for medications management;Assist for transportation;Help with stairs or ramp for entrance;Supervision due to cognitive status     Equipment Recommendations  Other (comment) (defer to next level of care)       Precautions / Restrictions Precautions Precautions:  Fall Recall of Precautions/Restrictions: Impaired Restrictions Weight Bearing Restrictions Per Provider Order: Yes RLE Weight Bearing Per Provider Order: Non weight bearing Other Position/Activity Restrictions: NWB'ing R residual limb     Mobility  Bed Mobility  General bed mobility comments: Pt was in recliner and plans to stay in recliner throughout remainder of admission.    Transfers Overall transfer level: Needs assistance Equipment used: Rolling walker (2 wheels) (bariatric) Transfers: Sit to/from Stand Sit to Stand: Max assist  General transfer comment: +2 assistance not available of time of session. pt did stand 3 x with clearing butt however unable to achieve full upright posture. pt voices frustration in lack of abilities. Chartered loss adjuster educated and issued HEP exercises to strengthening both BUEs/LLE/ R stump    Ambulation/Gait  General Gait Details: currently unable    Balance Overall balance assessment: Needs assistance Sitting-balance support: Feet supported, No upper extremity supported Sitting balance-Leahy Scale: Good     Standing balance support: Bilateral upper extremity supported, During functional activity, Reliant on assistive device for balance Standing balance-Leahy Scale: Poor       Communication Communication Communication: No apparent difficulties  Cognition Arousal: Alert Behavior During Therapy: WFL for tasks assessed/performed   PT - Cognitive impairments: No apparent impairments    PT - Cognition Comments: Pt is A and O x 4 this date. Throughout session ask appropriate questions and is extremely motivated for returning to PLOF Following commands: Intact      Cueing Cueing Techniques: Verbal cues, Tactile cues     General Comments General comments (skin integrity, edema, etc.): Author issued pt theraband for UE strengthening + HEP AKA handout. He perform all exercises withotu physical assistance." Know that  I know what to do, Ill start doing them  alot. Pt was also educated on post acute care needs nd PT going forward. discussed phantom limb sensations and importances of movement/desensitization      Pertinent Vitals/Pain Pain Assessment Pain Assessment: 0-10 Pain Score: 6      PT Goals (current goals can now be found in the care plan section) Acute Rehab PT Goals Patient Stated Goal: decrease pain, stand and walk again Progress towards PT goals: Progressing toward goals    Frequency    Min 5X/week       AM-PAC PT "6 Clicks" Mobility   Outcome Measure  Help needed turning from your back to your side while in a flat bed without using bedrails?: None Help needed moving from lying on your back to sitting on the side of a flat bed without using bedrails?: None Help needed moving to and from a bed to a chair (including a wheelchair)?: A Lot Help needed standing up from a chair using your arms (e.g., wheelchair or bedside chair)?: Total Help needed to walk in hospital room?: Total Help needed climbing 3-5 steps with a railing? : Total 6 Click Score: 13    End of Session   Activity Tolerance: Patient tolerated treatment well;Patient limited by fatigue Patient left: in chair;with call bell/phone within reach;with family/visitor present Nurse Communication: Mobility status PT Visit Diagnosis: Other abnormalities of gait and mobility (R26.89);Pain Pain - Right/Left: Right Pain - part of body: Leg     Time: 1610-9604 PT Time Calculation (min) (ACUTE ONLY): 24 min  Charges:    $Therapeutic Exercise: 8-22 mins $Therapeutic Activity: 8-22 mins PT General Charges $$ ACUTE PT VISIT: 1 Visit                     Chester Costa PTA 11/02/23, 4:16 PM

## 2023-11-03 DIAGNOSIS — Z7189 Other specified counseling: Secondary | ICD-10-CM | POA: Diagnosis not present

## 2023-11-03 DIAGNOSIS — L089 Local infection of the skin and subcutaneous tissue, unspecified: Secondary | ICD-10-CM | POA: Diagnosis not present

## 2023-11-03 DIAGNOSIS — E11628 Type 2 diabetes mellitus with other skin complications: Secondary | ICD-10-CM | POA: Diagnosis not present

## 2023-11-03 LAB — GLUCOSE, CAPILLARY
Glucose-Capillary: 408 mg/dL — ABNORMAL HIGH (ref 70–99)
Glucose-Capillary: 342 mg/dL — ABNORMAL HIGH (ref 70–99)
Glucose-Capillary: 304 mg/dL — ABNORMAL HIGH (ref 70–99)
Glucose-Capillary: 276 mg/dL — ABNORMAL HIGH (ref 70–99)

## 2023-11-03 LAB — TRIGLYCERIDES: Triglycerides: 702 mg/dL — ABNORMAL HIGH (ref <150)

## 2023-11-03 LAB — SURGICAL PATHOLOGY

## 2023-11-03 MED ORDER — ENOXAPARIN SODIUM 80 MG/0.8ML IJ SOSY
0.5000 mg/kg | PREFILLED_SYRINGE | INTRAMUSCULAR | Status: DC
  Administered 2023-11-03 – 2023-11-08 (×6): 75 mg via SUBCUTANEOUS
  Filled 2023-11-03 (×6): qty 0.75

## 2023-11-03 MED ORDER — INSULIN GLARGINE-YFGN 100 UNIT/ML ~~LOC~~ SOLN
40.0000 [IU] | Freq: Two times a day (BID) | SUBCUTANEOUS | Status: DC
  Administered 2023-11-03 – 2023-11-04 (×3): 40 [IU] via SUBCUTANEOUS
  Filled 2023-11-03 (×4): qty 0.4

## 2023-11-03 MED ORDER — INSULIN ASPART 100 UNIT/ML IJ SOLN
20.0000 [IU] | Freq: Three times a day (TID) | INTRAMUSCULAR | Status: DC
  Administered 2023-11-03 – 2023-11-04 (×6): 20 [IU] via SUBCUTANEOUS
  Filled 2023-11-03 (×6): qty 1

## 2023-11-03 MED ORDER — METHOCARBAMOL 500 MG PO TABS
500.0000 mg | ORAL_TABLET | Freq: Three times a day (TID) | ORAL | Status: DC | PRN
  Administered 2023-11-03 – 2023-11-08 (×10): 500 mg via ORAL
  Filled 2023-11-03 (×10): qty 1

## 2023-11-03 MED ORDER — LOSARTAN POTASSIUM 50 MG PO TABS
100.0000 mg | ORAL_TABLET | Freq: Every day | ORAL | Status: DC
  Administered 2023-11-03 – 2023-11-09 (×7): 100 mg via ORAL
  Filled 2023-11-03 (×7): qty 2

## 2023-11-03 NOTE — Progress Notes (Signed)
 Occupational Therapy Treatment Patient Details Name: Martin Riddle MRN: [id number] DOB: [birth year] Today's Date: 11/03/2023   History of present illness Pt is a 53 y.o. male s/p R AKA on 10/28/23 after presenting to [facility] with severe sepsis due to R foot wound infection. RR called 5/20 with pt unresponsive requiring intubation, off vent 5/22. PMH significant for DM, atherosclerosis of native arteries of the extremities with ulceration, diabetic foot ulcer, HTN, HLD, depression, chronic venous stasis in both legs, pancreatitis, peripheral neuropathy, & morbid obesity.   OT comments  Martin Riddle was seen for OT treatment on this date. Upon arrival to room pt seated in chair with spouse present, agreeable to tx. Pt requires MOD A x2 + BRW sit<>stand x3 trials, with cues for technique achieves fully upright posture on 2 trials. Phantom limb pain limiting standing tolerance ~30-45 seconds each trial. Extensive education on HEP, pain mgmt, and falls prevention. Pt reports having x2 falls overnight related to poor safety awareness and balance deficits.   Pt reports staying in recliner overnight and fearful of returning to bed despite education on importance of sidelying/supine to prevent hip contracture. Chair reclined to minimize hip flexion. Pt making good progress toward goals, will continue to follow POC. Discharge recommendation updated to reflect pt progress.       If plan is discharge home, recommend the following:  Two people to help with walking and/or transfers;Two people to help with bathing/dressing/bathroom;Assist for transportation;Help with stairs or ramp for entrance   Equipment Recommendations  Other (comment) (defer to next venue of care)    Recommendations for Other Services      Precautions / Restrictions Precautions Precautions: Fall Recall of Precautions/Restrictions: Impaired Restrictions Weight Bearing Restrictions Per Provider Order: Yes RLE Weight Bearing Per Provider  Order: Non weight bearing       Mobility Bed Mobility               General bed mobility comments: not tested    Transfers Overall transfer level: Needs assistance Equipment used: Rolling walker (2 wheels) Transfers: Sit to/from Stand Sit to Stand: Mod assist, +2 physical assistance                 Balance Overall balance assessment: Needs assistance Sitting-balance support: Feet supported, No upper extremity supported Sitting balance-Leahy Scale: Good     Standing balance support: Bilateral upper extremity supported, During functional activity, Reliant on assistive device for balance Standing balance-Leahy Scale: Poor                             ADL either performed or assessed with clinical judgement   ADL Overall ADL's : Needs assistance/impaired                                       General ADL Comments: SETUP seated UB dressing    Extremity/Trunk Assessment              Vision       Perception     Praxis     Communication Communication Communication: No apparent difficulties   Cognition Arousal: Alert Behavior During Therapy: WFL for tasks assessed/performed, Anxious Cognition: No apparent impairments                               Following  commands: Intact Following commands impaired: Follows one step commands with increased time, Follows multi-step commands inconsistently      Cueing   Cueing Techniques: Verbal cues, Tactile cues  Exercises Exercises: Other exercises, Amputee Amputee Exercises Quad Sets: AROM, Strengthening, Right, 5 reps, Seated Gluteal Sets: AROM, Strengthening, Right, 5 reps, Seated Hip ABduction/ADduction: AROM, Strengthening, Right, 5 reps, Seated Chair Push Up: AROM, Strengthening, Right, 5 reps, Seated    Shoulder Instructions       General Comments no ace bandage present, significant swelling noted to R residual limb    Pertinent Vitals/ Pain       Pain  Assessment Pain Assessment: 0-10 Pain Score: 10-Worst pain ever Pain Location: R residual limb Pain Descriptors / Indicators: Throbbing, Operative site guarding Pain Intervention(s): Limited activity within patient's tolerance, Premedicated before session  Home Living                                          Prior Functioning/Environment              Frequency  Min 3X/week        Progress Toward Goals  OT Goals(current goals can now be found in the care plan section)  Progress towards OT goals: Progressing toward goals  Acute Rehab OT Goals OT Goal Formulation: With patient Time For Goal Achievement: 11/15/23 Potential to Achieve Goals: Fair ADL Goals Pt Will Perform Lower Body Bathing: sitting/lateral leans;sit to/from stand;with adaptive equipment;with caregiver independent in assisting;with contact guard assist Pt Will Perform Lower Body Dressing: with caregiver independent in assisting;sit to/from stand;sitting/lateral leans;with adaptive equipment;with min assist Pt Will Transfer to Toilet: with transfer board;with min assist;stand pivot transfer;bedside commode;grab bars Pt Will Perform Toileting - Clothing Manipulation and hygiene: with mod assist;sitting/lateral leans;sit to/from stand;with adaptive equipment  Plan      Co-evaluation                 AM-PAC OT "6 Clicks" Daily Activity     Outcome Measure   Help from another person eating meals?: None Help from another person taking care of personal grooming?: A Little Help from another person toileting, which includes using toliet, bedpan, or urinal?: A Lot Help from another person bathing (including washing, rinsing, drying)?: A Lot Help from another person to put on and taking off regular upper body clothing?: A Little Help from another person to put on and taking off regular lower body clothing?: A Lot 6 Click Score: 16    End of Session Equipment Utilized During Treatment:  Gait belt;Rolling walker (2 wheels)  OT Visit Diagnosis: Pain;Other abnormalities of gait and mobility (R26.89);Unsteadiness on feet (R26.81);Muscle weakness (generalized) (M62.81) Pain - Right/Left: Right Pain - part of body: Leg   Activity Tolerance Patient tolerated treatment well   Patient Left in chair;with call bell/phone within reach;with family/visitor present   Nurse Communication Mobility status        Time: 8841-6606 OT Time Calculation (min): 33 min  Charges: OT General Charges $OT Visit: 1 Visit OT Treatments $Self Care/Home Management : 8-22 mins $Therapeutic Exercise: 8-22 mins  Gordan Latina, M.S. OTR/L  11/03/23, 4:26 PM  ascom [phone]

## 2023-11-03 NOTE — Progress Notes (Addendum)
 Daily Progress Note   Patient Name: Martin Riddle       Date: 11/03/2023 DOB: [birth year]  Age: 53 y.o. MRN#: [id number] Attending Physician: Sheril Dines, MD Primary Care Physician: Thomasene Flemings, NP Admit Date: 10/26/2023  Reason for Consultation/Follow-up: Establishing goals of care  Subjective: Notes and labs reviewed.  Into stay.  Patient is currently sitting in bedside chair with wife at bedside.  He states he fell last night and while using the bedside commode, discussed the situation.  We discussed his hope for going to CIR and getting a prosthetic.  He discusses wanting to get back to mowing the yard and being functionally independent as he has been.  We talked a bit about the typical time taken for the various steps excluding complications or setbacks.  We broached considering what boundaries to care plans would be depending on outcomes moving forward.  He states he is eager to continue to try to progress.  Patient began falling asleep during our discussion.   Length of Stay: 8  Current Medications: Scheduled Meds:   acetaminophen   500 mg Oral Q6H   arformoterol  15 mcg Nebulization BID   ascorbic acid  500 mg Oral BID   aspirin   81 mg Oral Daily   atorvastatin   20 mg Oral Daily   budesonide  (PULMICORT ) nebulizer solution  0.25 mg Nebulization BID   vitamin B-12  1,000 mcg Oral Daily   docusate sodium   100 mg Oral BID   enoxaparin  (LOVENOX ) injection  0.5 mg/kg Subcutaneous Q24H   famotidine   20 mg Oral Daily   insulin  aspart  0-15 Units Subcutaneous TID WC   insulin  aspart  0-5 Units Subcutaneous QHS   insulin  aspart  20 Units Subcutaneous TID WC   insulin  glargine-yfgn  60 Units Subcutaneous Daily   iron polysaccharides  150 mg Oral Daily   losartan   100 mg Oral Daily    multivitamin with minerals  1 tablet Oral Daily   nutrition supplement (JUVEN)  1 packet Oral BID BM   polyethylene glycol  17 g Oral Daily   pregabalin   150 mg Oral BID   Ensure Max Protein  11 oz Oral QHS   zinc  sulfate (50mg  elemental zinc )  220 mg Oral Daily    Continuous Infusions:   PRN Meds: bisacodyl , dextrose , ipratropium-albuterol , methocarbamol ,  ondansetron  (ZOFRAN ) IV, mouth rinse, oxyCODONE , oxyCODONE   Physical Exam Pulmonary:     Effort: Pulmonary effort is normal.  Musculoskeletal:     Comments: Right AKA with dressing in place that is clean, dry and intact.  Neurological:     Mental Status: He is alert.             Vital Signs: BP 131/86 (BP Location: Right Arm)   Pulse 87   Temp 99.3 F (37.4 C)   Resp 17   Ht 6\' 5"  (1.956 m)   Wt (!) 150.2 kg   SpO2 97%   BMI 39.27 kg/m  SpO2: SpO2: 97 % O2 Device: O2 Device: Room Air O2 Flow Rate: O2 Flow Rate (L/min): 2 L/min  Intake/output summary:  Intake/Output Summary (Last 24 hours) at 11/03/2023 1552 Last data filed at 11/03/2023 1109 Gross per 24 hour  Intake --  Output 3600 ml  Net -3600 ml   LBM: Last BM Date : 11/02/23 Baseline Weight: Weight: (!) 181.4 kg Most recent weight: Weight: (!) 150.2 kg    Patient Active Problem List   Diagnosis Date Noted   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   Diabetic foot infection (HCC) 10/26/2023   HTN (hypertension) 10/26/2023   HLD (hyperlipidemia) 10/26/2023   Type 2 diabetes mellitus with peripheral neuropathy (HCC) 10/26/2023   Obesity, Class III, BMI 40-49.9 (morbid obesity) 10/26/2023   Severe sepsis (HCC) 10/26/2023   Depression 10/26/2023   Osteomyelitis of right foot (HCC) 10/26/2023   Hypoglycemia 10/26/2023   Atherosclerosis of native arteries of the extremities with ulceration (HCC) 10/20/2023   Hypertension    Failure to attend appointment 07/07/2022   Cervical radiculopathy 01/07/2022   Lumbar radiculopathy 01/07/2022   Pain syndrome, chronic  01/07/2022   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   History of lumbar spinal fusion 01/07/2022   Chronic venous stasis dermatitis of both lower extremities    Cellulitis 05/30/2021   Multiple open wounds of lower extremity, initial encounter 05/30/2021   Respiratory failure (HCC) 02/19/2018   Severe recurrent major depression with psychotic features (HCC) 01/09/2017   Congenital flat foot 01/16/2015   DDD (degenerative disc disease), lumbar 01/16/2015   Diabetes mellitus, type 2 (HCC) 01/16/2015   Hypercholesteremia 01/16/2015   Gastro-esophageal reflux disease without esophagitis 01/16/2015   Peripheral neuropathy 01/16/2015    Palliative Care Assessment & Plan    Recommendations/Plan: Continue current care.  Code Status:    Code Status Orders  (From admission, onward)           Start     Ordered   10/26/23 2115  Full code  Continuous       Question:  By:  Answer:  Consent: discussion documented in EHR   10/26/23 2115           Code Status History     Date Active Date Inactive Code Status Order ID Comments User Context   05/30/2021 2008 06/01/2021 2059 Full Code [id number]  [place], Eric J, DO ED   01/10/2017 1131 01/15/2017 1433 Full Code [id number]  Evone Hoh, MD Inpatient   12/09/2015 1550 12/11/2015 1535 Full Code [id number]  [place] Carrel, MD Inpatient   01/27/2015 2007 01/30/2015 1345 Full Code [id number]  [place] Carrel, MD Inpatient     Thank you for allowing the Palliative Medicine Team to assist in the care of this patient.    Meribeth Standard, NP  Please contact Palliative Medicine Team phone at [phone] for questions and concerns.

## 2023-11-03 NOTE — Progress Notes (Signed)
   11/02/23 2214  What Happened  Was fall witnessed? No  Was patient injured? No  Patient found on floor  Found by Staff-comment (This RN and CNA found pt. on the floor.)  Stated prior activity other (comment) (From [facility] to chair.)  Provider Notification  Provider Name/Title Vallarie Gauze, MD  Date Provider Notified 11/03/23  Time Provider Notified [phone]  Method of Notification Page (secured chat)  Notification Reason Fall  Provider response Other (Comment) (monitor pt.)  Date of Provider Response 11/03/23  Time of Provider Response 0131  Adult Fall Risk Assessment  Risk Factor Category (scoring not indicated) Fall has occurred during this admission (document High fall risk)  Patient Fall Risk Level High fall risk  Adult Fall Risk Interventions  Required Bundle Interventions *See Row Information* High fall risk - low, moderate, and high requirements implemented  Additional Interventions PT/OT need assessed if change in mobility from baseline;Use of appropriate toileting equipment (bedpan, BSC, etc.)  Screening for Fall Injury Risk (To be completed on HIGH fall risk patients) - Assessing Need for Floor Mats  Risk For Fall Injury- Criteria for Floor Mats Previous fall this admission  Will Implement Floor Mats  (pt. refused.)  Vitals  Temp 98.5 F (36.9 C)  Temp Source Oral  BP (!) 157/90  MAP (mmHg) 103  BP Location Right Arm  BP Method Automatic  Patient Position (if appropriate) Sitting  Pulse Rate (!) 105  Pulse Rate Source Monitor  Resp 20  Oxygen Therapy  SpO2 92 %  O2 Device Room Air  Pain Assessment  Pain Scale 0-10  Pain Score 8  Pain Type Chronic pain  Pain Location Back  Pain Intervention(s) Repositioned;Emotional support  Neurological  Neuro (WDL) WDL  Level of Consciousness Alert  Orientation Level Oriented X4  Cognition Poor safety awareness;Poor judgement  Glasgow Coma Scale  Eye Opening 4  Best Verbal Response (NON-intubated) 5  Best Motor Response 6   Glasgow Coma Scale Score 15  Musculoskeletal  Musculoskeletal (WDL) X  Assistive Device MaxiMove  Generalized Weakness Yes  Weight Bearing Restrictions Per Provider Order Yes  RLE Weight Bearing Per Provider Order NWB  Integumentary  Integumentary (WDL) X  Skin Color Pale  Skin Condition Diaphoretic  Skin Integrity Erythema/redness  Ecchymosis Location Arm;Leg;Abdomen  Ecchymosis Location Orientation Bilateral  Erythema/Redness Location Arm;Abdomen  Erythema/Redness Location Orientation Bilateral  Rash Location Foot  Rash Location Orientation Left  Skin Turgor Non-tenting   Patient was getting up off the BSC back to the chair. Patient was assisted to the [facility] and educated to call out when finished. Patient called out and insisted he could clean himself up "per CNA". Patient then fell against the bed and slid on the floor while trying to clean himself. Patient had called out and this RN found the pt. on the floor. Patient stated he was fine and did not hit anything. Patient appeared to be embarrassed and upset that he could not clean himself up. Educated patient on the importance of calling out for assistance.

## 2023-11-03 NOTE — Progress Notes (Signed)
 Progress Note    Martin Riddle  UEA:[id number] DOB: 1971/03/30  DOA: 10/26/2023 PCP: Thomasene Flemings, NP      Brief Narrative:    Medical records reviewed and are as summarized below:  Martin Riddle is a 53 y.o. male with medical history significant for DM, atherosclerosis of native arteries of the extremities with ulceration, diabetic foot ulcer, HTN, HLD, depression, chronic venous stasis in both legs, pancreatitis, peripheral neuropathy, morbid obesity, who presented to the hospital with right foot wound infection.  He complained of pain in the right foot and also noticed a foul smell.  He had had the wound for more than 3 weeks.  He had been previously evaluated by Dr. Vonna Guardian, vascular surgeon, on 10/20/2023, and angiography and possible revascularization had been recommended on that visit.  He was admitted to the hospital for severe sepsis secondary to right foot infection/osteomyelitis.  He was started on empiric IV Zosyn  and vancomycin .   Significant Hospital Events: Including procedures, antibiotic start and stop dates in addition to other pertinent events   05/19: Pt admitted to the [facility] unit with gangrene of right foot, however he remained in the ER pending bed availability 05/20: Rapid response initiated pt minimally responsive found to be hypoglycemic CBG <10 pt received 1 amp of D50W and placed on Bipap and transported to the stepdown unit.  He was initially IVC'd by psychiatry due to concern of inability to make sound medical decisions for himself.  Pts mentation improved and he was transitioned to nasal canula with improvement in CBG's.  Psych rescinded the IVC and pt deemed competent to make medical decisions.  Unfortunately, later during the shift pt became unresponsive and hypoxic requiring mechanical intubation 10/28/23- patient scheduled for amputation today due to critical limb ischemia with gangrene of bilateral lower extermities. He remains on MV with sedation.   10/29/23- s/p right amputation.  Blood glucose stable. For bedside SLP today which he passed and diet has been initiated. He is liberated off ventilator.          Assessment/Plan:   Principal Problem:   Diabetic foot infection (HCC) Active Problems:   Osteomyelitis of right foot (HCC)   Atherosclerosis of native arteries of the extremities with ulceration (HCC)   Severe sepsis (HCC)   Type 2 diabetes mellitus with peripheral neuropathy (HCC)   HTN (hypertension)   HLD (hyperlipidemia)   Depression   Obesity, Class III, BMI 40-49.9 (morbid obesity)   Hypoglycemia   Gas gangrene (HCC)   Nutrition Problem: Inadequate oral intake Etiology: inability to eat  Signs/Symptoms: NPO status   Body mass index is 39.27 kg/m.     Acute hypoxic and hypercapnic respiratory failure: Improved.  S/p extubation on 10/29/2023.  He is tolerating room air.   There are concerns that patient has OSA.  Patient said he has never been officially diagnosed but there is some suspicion he may have it.   Overnight oximetry showed that oxygen saturation was 82 to 89% for the duration of 2 hours and 15 minutes, 67 to 79% for duration of 12 minutes, 60 to 69% for the duration of 11 minutes. ABG on room air showed pH 7.44, pCO2 48, pO2 68. Recommend nighttime oxygen.  He does not qualify for BiPAP or trilogy machine based on ABG results. Recommended outpatient follow-up for sleep study.   Septic shock secondary to right foot osteomyelitis with gangrene of first metatarsophalangeal joint:  Shock physiology has resolved.  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 Completed IV Unasyn and vancomycin  on 11/02/2023. Follow-up with vascular surgeon.   AKI: Resolved Hyponatremia: Fluctuating sodium levels.  Some of this may be from hyperglycemia.   Type II DM with hyperglycemia, s/p hypoglycemia: Hypoglycemia has resolved Glucose level still in the 300s. Increase  Lantus  from 60 units to 40 units twice daily. Increase NovoLog  from 15 units to 20 units 3 times daily with meals. Continue NovoLog  sliding scale. Hemoglobin A1c 8.9.  Home regimen His wife said he takes Lantus  100 units but he is not sure if it is once a day or twice a day.  He also takes Humalog  25 units with meals.  She said even with this regimen, his blood sugar runs in the 300s to 500s.  She used to be under the care of Dr. Lorelei Rogers, endocrinologist, but he was discharged from her clinic a few years ago because of medical nonadherence.   Acute toxic metabolic encephalopathy: Improved (He became unresponsive and was intubated on 10/27/2023).   General weakness: PT and OT recommended discharge to SNF.  Follow-up with TOC to assist with disposition. S/p fall: Fall precautions.  Patient has been advised to call for assistance whenever he wants to use the bathroom or needs assistance with transfers.   Comorbidities include hypertension, hyperlipidemia, depression, OHS.  His wife said he has never been tested for sleep apnea but she suspects that patient may have sleep apnea and she had advised him to get tested but he never did.      Diet Order             Diet Carb Modified Fluid consistency: Thin  Diet effective now                            Consultants: Vascular surgeon Podiatrist ID specialist Intensivist  Procedures: Intubation and mechanical ventilation Right above-knee amputation on 10/28/2023    Medications:    acetaminophen   500 mg Oral Q6H   arformoterol  15 mcg Nebulization BID   ascorbic acid  500 mg Oral BID   aspirin   81 mg Oral Daily   atorvastatin   20 mg Oral Daily   budesonide  (PULMICORT ) nebulizer solution  0.25 mg Nebulization BID   vitamin B-12  1,000 mcg Oral Daily   docusate sodium   100 mg Oral BID   enoxaparin  (LOVENOX ) injection  0.5 mg/kg Subcutaneous Q24H   famotidine   20 mg Oral Daily   insulin  aspart  0-15 Units Subcutaneous  TID WC   insulin  aspart  0-5 Units Subcutaneous QHS   insulin  aspart  20 Units Subcutaneous TID WC   insulin  glargine-yfgn  60 Units Subcutaneous Daily   iron polysaccharides  150 mg Oral Daily   losartan   100 mg Oral Daily   multivitamin with minerals  1 tablet Oral Daily   nutrition supplement (JUVEN)  1 packet Oral BID BM   polyethylene glycol  17 g Oral Daily   pregabalin   150 mg Oral BID   Ensure Max Protein  11 oz Oral QHS   zinc  sulfate (50mg  elemental zinc )  220 mg Oral Daily   Continuous Infusions:     Anti-infectives (From admission, onward)    Start     Dose/Rate Route Frequency Ordered Stop   10/29/23 2200  vancomycin  (VANCOREADY) IVPB 1500 mg/300 mL        1,500 mg 150 mL/hr over 120 Minutes Intravenous Every 12 hours 10/29/23 1639  11/02/23 0735   10/29/23 1800  Ampicillin -Sulbactam (UNASYN) 3 g in sodium chloride  0.9 % 100 mL IVPB        3 g 200 mL/hr over 30 Minutes Intravenous Every 6 hours 10/29/23 1637 11/02/23 0736   10/28/23 1100  ceFAZolin (ANCEF) IVPB 2g/100 mL premix        2 g 200 mL/hr over 30 Minutes Intravenous 30 min pre-op 10/27/23 2242 10/28/23 2130   10/27/23 2239  ceFAZolin (ANCEF) IVPB 2g/100 mL premix  Status:  Discontinued        2 g 200 mL/hr over 30 Minutes Intravenous 30 min pre-op 10/27/23 2239 10/27/23 2242   10/27/23 1000  vancomycin  (VANCOREADY) IVPB 1750 mg/350 mL  Status:  Discontinued        1,750 mg 175 mL/hr over 120 Minutes Intravenous Every 12 hours 10/26/23 2322 10/29/23 1639   10/27/23 0300  piperacillin -tazobactam (ZOSYN ) IVPB 3.375 g  Status:  Discontinued        3.375 g 12.5 mL/hr over 240 Minutes Intravenous Every 8 hours 10/26/23 2305 10/29/23 1637   10/26/23 2230  vancomycin  (VANCOCIN ) IVPB 1000 mg/200 mL premix  Status:  Discontinued       Placed in "And" Linked Group   1,000 mg 200 mL/hr over 60 Minutes Intravenous Every 24 hours 10/26/23 2048 10/27/23 1100   10/26/23 2130  piperacillin -tazobactam (ZOSYN ) IVPB 3.375  g        3.375 g 100 mL/hr over 30 Minutes Intravenous  Once 10/26/23 2041 10/26/23 2157   10/26/23 2100  vancomycin  (VANCOREADY) IVPB 1500 mg/300 mL  Status:  Discontinued       Placed in "And" Linked Group   1,500 mg 150 mL/hr over 120 Minutes Intravenous Every 24 hours 10/26/23 2048 10/27/23 1100   10/26/23 2045  vancomycin  (VANCOREADY) IVPB 2000 mg/400 mL  Status:  Discontinued        2,000 mg 200 mL/hr over 120 Minutes Intravenous  Once 10/26/23 2041 10/26/23 2048              Family Communication/Anticipated D/C date and plan/Code Status   DVT prophylaxis:      Code Status: Full Code  Family Communication: None Disposition Plan: Plan to discharge to SNF   Status is: Inpatient Remains inpatient appropriate because: Right foot infection       Subjective:   Interval events noted.  Chart reviewed review shows that he fell in the wee hours of the morning.  He fell again later in the morning.  He said he was trying to get up from the bed to the commode but he could not wait for the nursing staff to come to his aid.  He said "I should have waited.  It was my fault".  He said some things are private especially when he has to use the bathroom.  Objective:    Vitals:   11/02/23 2214 11/03/23 0416 11/03/23 0900 11/03/23 1550  BP: (!) 157/90 (!) 169/80 (!) 146/70 131/86  Pulse: (!) 105 95 (!) 103 87  Resp: 20 18  17   Temp: 98.5 F (36.9 C) 98 F (36.7 C) 97.7 F (36.5 C) 99.3 F (37.4 C)  TempSrc: Oral Oral Axillary   SpO2: 92% 96% 99% 97%  Weight:      Height:       No data found.   Intake/Output Summary (Last 24 hours) at 11/03/2023 1606 Last data filed at 11/03/2023 1109 Gross per 24 hour  Intake --  Output 3600 ml  Net -3600 ml   Filed Weights   10/26/23 1804 10/27/23 1247  Weight: (!) 181.4 kg (!) 150.2 kg    Exam:  GEN: NAD SKIN: Warm and dry EYES: No pallor or icterus ENT: MMM CV: RRR PULM: CTA B ABD: soft, ND, NT, +BS CNS: AAO x 3,  non focal EXT: Right AKA.  No edema of left lower extremity and bilateral upper extremities.         Data Reviewed:   I have personally reviewed following labs and imaging studies:  Labs: Labs show the following:   Basic Metabolic Panel: Recent Labs  Lab 10/28/23 0316 10/29/23 0331 10/30/23 0518 10/31/23 0322 11/01/23 0359 11/01/23 2139  NA 135 134* 134* 132* 133* 129*  K 4.4 4.9 4.5 4.7 4.7 4.8  CL 99 101 99 95* 95* 93*  CO2 26 26 28 26 28 26   GLUCOSE 104* 99 250* 295* 323* 445*  BUN 41* 32* 19 24* 22* 29*  CREATININE 1.21 0.81 0.82 0.75 0.76 1.00  CALCIUM  8.1* 8.0* 8.3* 8.6* 8.7* 7.8*  MG 2.5* 2.4 2.1  --   --   --   PHOS 6.4* 4.1 2.2*  --   --   --    GFR Estimated Creatinine Clearance: 137.1 mL/min (by C-G formula based on SCr of 1 mg/dL). Liver Function Tests: No results for input(s): "AST", "ALT", "ALKPHOS", "BILITOT", "PROT", "ALBUMIN" in the last 168 hours.  No results for input(s): "LIPASE", "AMYLASE" in the last 168 hours. No results for input(s): "AMMONIA" in the last 168 hours. Coagulation profile No results for input(s): "INR", "PROTIME" in the last 168 hours.   CBC: Recent Labs  Lab 10/28/23 0316 10/29/23 0331 10/30/23 0518 10/31/23 0322 11/01/23 0359  WBC 8.2 8.6 9.0 9.5 8.9  HGB 9.7* 8.7* 9.0* 8.6* 8.5*  HCT 32.7* 28.7* 28.7* 27.7* 27.6*  MCV 96.5 95.3 94.4 93.6 92.9  PLT 269 278 282 292 329   Cardiac Enzymes: No results for input(s): "CKTOTAL", "CKMB", "CKMBINDEX", "TROPONINI" in the last 168 hours. BNP (last 3 results) No results for input(s): "PROBNP" in the last 8760 hours. CBG: Recent Labs  Lab 11/02/23 1659 11/02/23 2136 11/03/23 0747 11/03/23 1156 11/03/23 1552  GLUCAP 304* 393* 408* 342* 304*   D-Dimer: No results for input(s): "DDIMER" in the last 72 hours.  Hgb A1c: No results for input(s): "HGBA1C" in the last 72 hours.  Lipid Profile: Recent Labs    11/03/23 0840  TRIG 702*   Thyroid function  studies: No results for input(s): "TSH", "T4TOTAL", "T3FREE", "THYROIDAB" in the last 72 hours.  Invalid input(s): "FREET3"  Anemia work up: No results for input(s): "VITAMINB12", "FOLATE", "FERRITIN", "TIBC", "IRON", "RETICCTPCT" in the last 72 hours. Sepsis Labs: Recent Labs  Lab 10/27/23 1900 10/27/23 2107 10/28/23 0316 10/29/23 0331 10/30/23 0518 10/31/23 0322 11/01/23 0359  WBC  --   --    < > 8.6 9.0 9.5 8.9  LATICACIDVEN 0.7 1.0  --   --   --   --   --    < > = values in this interval not displayed.    Microbiology Recent Results (from the past 240 hours)  Blood culture (single)     Status: None   Collection Time: 10/26/23  6:17 PM   Specimen: BLOOD RIGHT HAND  Result Value Ref Range Status   Specimen Description BLOOD RIGHT HAND  Final   Special Requests   Final    BOTTLES DRAWN AEROBIC AND ANAEROBIC Blood Culture results  may not be optimal due to an inadequate volume of blood received in culture bottles   Culture   Final    NO GROWTH 5 DAYS Performed at [facility], [address] [place], [place] [address]    Report Status 10/31/2023 FINAL  Final  Culture, blood (single)     Status: None   Collection Time: 10/26/23  8:04 PM   Specimen: BLOOD  Result Value Ref Range Status   Specimen Description BLOOD LA  Final   Special Requests   Final    BOTTLES DRAWN AEROBIC AND ANAEROBIC Blood Culture adequate volume   Culture   Final    NO GROWTH 5 DAYS Performed at [facility], [address] [place], [place] [address]    Report Status 10/31/2023 FINAL  Final  Resp panel by RT-PCR (RSV, Flu A&B, Covid) Anterior Nasal Swab     Status: None   Collection Time: 10/27/23  9:53 AM   Specimen: Anterior Nasal Swab  Result Value Ref Range Status   SARS Coronavirus 2 by RT PCR NEGATIVE NEGATIVE Final    Comment: (NOTE) SARS-CoV-2 target nucleic acids are NOT DETECTED.  The SARS-CoV-2 RNA is generally detectable in upper respiratory specimens  during the acute phase of infection. The lowest concentration of SARS-CoV-2 viral copies this assay can detect is 138 copies/mL. A negative result does not preclude SARS-Cov-2 infection and should not be used as the sole basis for treatment or other patient management decisions. A negative result may occur with  improper specimen collection/handling, submission of specimen other than nasopharyngeal swab, presence of viral mutation(s) within the areas targeted by this assay, and inadequate number of viral copies(<138 copies/mL). A negative result must be combined with clinical observations, patient history, and epidemiological information. The expected result is Negative.  Fact Sheet for Patients:  BloggerCourse.com  Fact Sheet for Healthcare Providers:  SeriousBroker.it  This test is no t yet approved or cleared by the [place]  FDA and  has been authorized for detection and/or diagnosis of SARS-CoV-2 by FDA under an Emergency Use Authorization (EUA). This EUA will remain  in effect (meaning this test can be used) for the duration of the COVID-19 declaration under Section 564(b)(1) of the Act, 21 U.S.C.section 360bbb-3(b)(1), unless the authorization is terminated  or revoked sooner.       Influenza A by PCR NEGATIVE NEGATIVE Final   Influenza B by PCR NEGATIVE NEGATIVE Final    Comment: (NOTE) The Xpert Xpress SARS-CoV-2/FLU/RSV plus assay is intended as an aid in the diagnosis of influenza from Nasopharyngeal swab specimens and should not be used as a sole basis for treatment. Nasal washings and aspirates are unacceptable for Xpert Xpress SARS-CoV-2/FLU/RSV testing.  Fact Sheet for Patients: BloggerCourse.com  Fact Sheet for Healthcare Providers: SeriousBroker.it  This test is not yet approved or cleared by the [place]  FDA and has been authorized for detection  and/or diagnosis of SARS-CoV-2 by FDA under an Emergency Use Authorization (EUA). This EUA will remain in effect (meaning this test can be used) for the duration of the COVID-19 declaration under Section 564(b)(1) of the Act, 21 U.S.C. section 360bbb-3(b)(1), unless the authorization is terminated or revoked.     Resp Syncytial Virus by PCR NEGATIVE NEGATIVE Final    Comment: (NOTE) Fact Sheet for Patients: BloggerCourse.com  Fact Sheet for Healthcare Providers: SeriousBroker.it  This test is not yet approved or cleared by the [place]  FDA and has been authorized for detection and/or diagnosis of SARS-CoV-2  by FDA under an Emergency Use Authorization (EUA). This EUA will remain in effect (meaning this test can be used) for the duration of the COVID-19 declaration under Section 564(b)(1) of the Act, 21 U.S.C. section 360bbb-3(b)(1), unless the authorization is terminated or revoked.  Performed at [facility], [address] [place], [place] [address]   MRSA Next Gen by PCR, Nasal     Status: None   Collection Time: 10/27/23 12:59 PM   Specimen: Nasal Mucosa; Nasal Swab  Result Value Ref Range Status   MRSA by PCR Next Gen NOT DETECTED NOT DETECTED Final    Comment: (NOTE) The GeneXpert MRSA Assay (FDA approved for NASAL specimens only), is one component of a comprehensive MRSA colonization surveillance program. It is not intended to diagnose MRSA infection nor to guide or monitor treatment for MRSA infections. Test performance is not FDA approved in patients less than 41 years old. Performed at [facility], [address] [place], [place] [address]     Procedures and diagnostic studies:  No results found.             LOS: 8 days   Rosario Kushner  [facility]   Pager on [email or web address]. If 7PM-7AM, please contact night-coverage at [email or web address]     11/03/2023, 4:06 PM

## 2023-11-03 NOTE — Plan of Care (Signed)
   Problem: Coping: Goal: Ability to adjust to condition or change in health will improve Outcome: Progressing   Problem: Fluid Volume: Goal: Ability to maintain a balanced intake and output will improve Outcome: Progressing   Problem: Health Behavior/Discharge Planning: Goal: Ability to identify and utilize available resources and services will improve Outcome: Progressing

## 2023-11-03 NOTE — Progress Notes (Signed)
 Nutrition Follow-up  DOCUMENTATION CODES:   Obesity unspecified  INTERVENTION:   -Continue carb modified diet -Continue MVI with minerals daily -Continue 500 mg vitamin C BID -Continue 220 mg zinc  sulfate daily x 145 days -Continue 1 packet Juven BID, each packet provides 95 calories, 2.5 grams of protein (collagen), and 9.8 grams of carbohydrate (3 grams sugar); also contains 7 grams of L-arginine and L-glutamine, 300 mg vitamin C, 15 mg vitamin E, 1.2 mcg vitamin B-12, 9.5 mg zinc , 200 mg calcium , and 1.5 g  Calcium  Beta-hydroxy-Beta-methylbutyrate to support wound healing  -Continue Ensure Max po daily, each supplement provides 150 kcal and 30 grams of protein.   -Continue double protein portions with meals   NUTRITION DIAGNOSIS:   Inadequate oral intake related to inability to eat as evidenced by NPO status.  Progressing; advanced to PO diet on 10/29/23   GOAL:   Patient will meet greater than or equal to 90% of their needs  Progressing   MONITOR:   PO intake, Supplement acceptance  REASON FOR ASSESSMENT:   Ventilator    ASSESSMENT:   Pt with medical history significant of DM, atherosclerosis of native arteries of the extremities with ulceration, diabetic foot ulcer, HTN, HLD, depression, chronic venous stasis in both legs, pancreatitis, peripheral neuropathy, morbid obesity with BMI of 47, who presents with right foot wound infection.  5/20- IVC d/c, intubated  5/21- s/p rt AKA 5/22- extubated, advanced to Heart Healthy diet  Reviewed I/O's: -2.2 L x 24 hours and -14.8 L since admission  UOP: 2.4 L x 24 hours  Pt unavailable at time of visit.   Pt continues to have a good appetite. Noted meal completions 100%.   No new wt since last visit.   Medications reviewed and include vitamin C, lipitor, vitamin C, miralax , and zinc  sulfate.  Palliative care follow for goals of care discussions; pt desires full code.   Per TOC notes, plan for possible SNF at d/c.    Labs reviewed: Na: 129, CBGS: 301-408 (inpatient orders for glycemic control are 0-15 units insulin  aspart TID, 0-5 units insulin  aspart daily at bedtime, 20 units insulin  aspart TID with meals, and 60 units insulin  glargine-yfgn daily). Noted DM coordinator recommendations from today.   Diet Order:   Diet Order             Diet Carb Modified Fluid consistency: Thin  Diet effective now                   EDUCATION NEEDS:   Not appropriate for education at this time  Skin:  Skin Assessment: Skin Integrity Issues: Skin Integrity Issues:: Incisions Incisions: s/p rt AKA Other: n/a  Last BM:  11/03/23 (type 7)  Height:   Ht Readings from Last 1 Encounters:  10/27/23 6\' 5"  (1.956 m)    Weight:   Wt Readings from Last 1 Encounters:  10/27/23 (!) 150.2 kg    Ideal Body Weight:  86.9 kg (adjustef for upcoming rt AKA)  BMI:  Body mass index is 39.27 kg/m.  Estimated Nutritional Needs:   Kcal:  2400-2600  Protein:  150-170 grams  Fluid:  2.0-2.2 L    Herschel Lords, RD, LDN, CDCES Registered Dietitian III Certified Diabetes Care and Education Specialist If unable to reach this RD, please use "RD Inpatient" group chat on secure chat between hours of 8am-4 pm daily

## 2023-11-03 NOTE — Inpatient Diabetes Management (Signed)
 Inpatient Diabetes Program Recommendations  AACE/ADA: New Consensus Statement on Inpatient Glycemic Control (2015)  Target Ranges:  Prepandial:   less than 140 mg/dL      Peak postprandial:   less than 180 mg/dL (1-2 hours)      Critically ill patients:  140 - 180 mg/dL    Latest Reference Range & Units 11/02/23 07:55 11/02/23 08:25 11/02/23 11:45 11/02/23 16:59 11/02/23 21:36  Glucose-Capillary 70 - 99 mg/dL 147 (H) 829 (H)  26 units Novolog   330 (H)  26 units Novolog   60 units Semglee  @1012   304 (H)  26 units Novolog   393 (H)  5 units Novolog    (H): Data is abnormally high  Latest Reference Range & Units 11/03/23 07:47  Glucose-Capillary 70 - 99 mg/dL 562 (H)  (H): Data is abnormally high    Home DM Meds: Lantus  100 units BID     Humalog  25 units BID    Current Orders: Semglee  60 units daily      Novolog  15 units TID with meals      Novolog  Moderate Correction Scale/ SSI (0-15 units) TID AC + HS    MD- Note CBG 408 this AM  Please consider:  1. Increase the Semglee  to 40 units BID  2. Increase the Novolog  Meal Coverage to 20 units TID with meals  3. May also consider increasing the frequency of the Novolog  SSI coverage to Q4 hours while CBGs remain so severely elevated    --Will follow patient during hospitalization--  Langston Pippins RN, MSN, CDCES Diabetes Coordinator Inpatient Glycemic Control Team Team Pager: [phone] (8a-5p)

## 2023-11-04 DIAGNOSIS — Z7189 Other specified counseling: Secondary | ICD-10-CM

## 2023-11-04 DIAGNOSIS — L089 Local infection of the skin and subcutaneous tissue, unspecified: Secondary | ICD-10-CM | POA: Diagnosis not present

## 2023-11-04 DIAGNOSIS — E11628 Type 2 diabetes mellitus with other skin complications: Secondary | ICD-10-CM | POA: Diagnosis not present

## 2023-11-04 LAB — GLUCOSE, CAPILLARY
Glucose-Capillary: 364 mg/dL — ABNORMAL HIGH (ref 70–99)
Glucose-Capillary: 362 mg/dL — ABNORMAL HIGH (ref 70–99)
Glucose-Capillary: 310 mg/dL — ABNORMAL HIGH (ref 70–99)
Glucose-Capillary: 292 mg/dL — ABNORMAL HIGH (ref 70–99)

## 2023-11-04 MED ORDER — BUSPIRONE HCL 10 MG PO TABS
30.0000 mg | ORAL_TABLET | Freq: Three times a day (TID) | ORAL | Status: DC
  Administered 2023-11-04 – 2023-11-09 (×16): 30 mg via ORAL
  Filled 2023-11-04 (×16): qty 3

## 2023-11-04 MED ORDER — PREGABALIN 75 MG PO CAPS
200.0000 mg | ORAL_CAPSULE | Freq: Two times a day (BID) | ORAL | Status: DC
  Administered 2023-11-04 – 2023-11-09 (×10): 200 mg via ORAL
  Filled 2023-11-04 (×10): qty 1

## 2023-11-04 MED ORDER — BISACODYL 5 MG PO TBEC
5.0000 mg | DELAYED_RELEASE_TABLET | Freq: Every day | ORAL | Status: DC
  Administered 2023-11-04 – 2023-11-08 (×4): 5 mg via ORAL
  Filled 2023-11-04 (×4): qty 1

## 2023-11-04 MED ORDER — ENSURE MAX PROTEIN PO LIQD
11.0000 [oz_av] | Freq: Two times a day (BID) | ORAL | Status: DC
  Administered 2023-11-04 – 2023-11-08 (×9): 11 [oz_av] via ORAL
  Filled 2023-11-04: qty 330

## 2023-11-04 NOTE — Progress Notes (Signed)
 Progress Note    11/04/2023 12:54 PM 7 Days Post-Op  Subjective:  Khameron Gruenwald is a 53 yo male who is now post op day #7 from  right above the knee amputation. Patient endorses he is having phantom pains and some pains to the incision site.  This is normal at this point.  Patient endorses he feels better all over.  No other complaints.  Vitals are remained stable.    Vitals:   11/04/23 0454 11/04/23 0943  BP: (!) 144/81 127/67  Pulse: 90 96  Resp: 18 16  Temp: 97.8 F (36.6 C) 98.1 F (36.7 C)  SpO2: 100% 92%   Physical Exam: Cardiac:  RRR, Normal S1, S2. No  rubs, clicks or gallup's. No murmurs. Lungs: Ventilated on pressure support.  Lungs were rhonchorous throughout. Incisions: Right lower extremity AKA.  Clean dry and intact. Extremities: Right AKA dressing clean dry and intact.  No complications.  Left lower extremity with cellulitis +2 swelling.  Unable to palpate pulses.  Foot is warm to the touch. Abdomen: Positive bowel sounds throughout, soft, tender and distended. Neurologic: Intubated and sedated.  CBC    Component Value Date/Time   WBC 8.9 11/01/2023 0359   RBC 2.97 (L) 11/01/2023 0359   HGB 8.5 (L) 11/01/2023 0359   HGB 13.4 03/15/2014 0339   HCT 27.6 (L) 11/01/2023 0359   HCT 40.4 03/15/2014 0339   PLT 329 11/01/2023 0359   PLT 173 03/15/2014 0339   MCV 92.9 11/01/2023 0359   MCV 94 03/15/2014 0339   MCH 28.6 11/01/2023 0359   MCHC 30.8 11/01/2023 0359   RDW 14.7 11/01/2023 0359   RDW 13.1 03/15/2014 0339   LYMPHSABS 1.4 10/26/2023 1817   LYMPHSABS 2.2 03/12/2014 0600   MONOABS 0.9 10/26/2023 1817   MONOABS 1.3 (H) 03/12/2014 0600   EOSABS 0.1 10/26/2023 1817   EOSABS 0.0 03/12/2014 0600   BASOSABS 0.0 10/26/2023 1817   BASOSABS 0.1 03/12/2014 0600    BMET    Component Value Date/Time   NA 129 (L) 11/01/2023 2139   NA 135 (L) 03/15/2014 0339   K 4.8 11/01/2023 2139   K 3.1 (L) 03/15/2014 0339   CL 93 (L) 11/01/2023 2139   CL 104  03/15/2014 0339   CO2 26 11/01/2023 2139   CO2 24 03/15/2014 0339   GLUCOSE 445 (H) 11/01/2023 2139   GLUCOSE 127 (H) 03/15/2014 0339   BUN 29 (H) 11/01/2023 2139   BUN 9 03/15/2014 0339   CREATININE 1.00 11/01/2023 2139   CREATININE 0.64 03/15/2014 0339   CALCIUM  7.8 (L) 11/01/2023 2139   CALCIUM  8.4 (L) 03/15/2014 0339   GFRNONAA >60 11/01/2023 2139   GFRNONAA >60 03/15/2014 0339   GFRAA >60 02/28/2018 0429   GFRAA >60 03/15/2014 0339    INR    Component Value Date/Time   INR 1.2 10/26/2023 2136     Intake/Output Summary (Last 24 hours) at 11/04/2023 1254 Last data filed at 11/04/2023 1610 Gross per 24 hour  Intake 240 ml  Output 2850 ml  Net -2610 ml     Assessment/Plan:  53 y.o. male is s/p right above the knee amputation.  7 Days Post-Op   PLAN  Patient is resting comfortably in bed this morning.  Right AKA healing as expected.  Dressing remains intact.  Vascular surgery plans on taking the patient to the vascular lab tomorrow on 11/05/2023 for left lower extremity angiogram with possible intervention.  I discussed again today in detail with the  patient the procedure, benefits, risk, complications.  Patient verbalizes understanding wishes to proceed.  Answered all his questions this afternoon.  Patient will be made n.p.o. after midnight tonight for procedure tomorrow.  I discussed the case in detail with Dr. Mikki Alexander MD and he agrees with the plan.   DVT prophylaxis:  ASA 81 mg daily Lipitor 20 mg daily and heparin  5000 units subcu every 8 hours    Annamaria [facility] 11/04/2023 12:54 PM

## 2023-11-04 NOTE — Progress Notes (Signed)
 Daily Progress Note   Patient Name: Martin Riddle       Date: 11/04/2023 DOB: 04-12-1971  Age: 53 y.o. MRN#: [id number] Attending Physician: Alphonsus Jeans, MD Primary Care Physician: Thomasene Flemings, NP Admit Date: 10/26/2023  Reason for Consultation/Follow-up: Establishing goals of care  Subjective: Notes and labs reviewed.  Spoke with attending for updates.  In to see patient.  No family at bedside.  He is currently sitting in bedside chair.  He states laying in the bed makes him feel "like I am smothering".  At bedside he complains of muscle spasms.  Had a long conversation with patient.  Discussed his emotions regarding having this amputation, and regarding his care moving forward.  He discusses his frustrations and concerns with staff, with his health, and with himself.  He discusses his faith in God and being unsure of how to move forward as there are unknowns that make it very challenging to make decisions.  Long discussion today on his faith and God's sovereignty.  He discusses a preference for detailed conversations to be had privately with and without other family members present.  Length of Stay: 9  Current Medications: Scheduled Meds:   acetaminophen   500 mg Oral Q6H   arformoterol  15 mcg Nebulization BID   ascorbic acid  500 mg Oral BID   aspirin   81 mg Oral Daily   atorvastatin   20 mg Oral Daily   bisacodyl   5 mg Oral Daily   budesonide  (PULMICORT ) nebulizer solution  0.25 mg Nebulization BID   busPIRone   30 mg Oral TID   vitamin B-12  1,000 mcg Oral Daily   docusate sodium   100 mg Oral BID   enoxaparin  (LOVENOX ) injection  0.5 mg/kg Subcutaneous Q24H   famotidine   20 mg Oral Daily   insulin  aspart  0-15 Units Subcutaneous TID WC   insulin  aspart  0-5 Units  Subcutaneous QHS   insulin  aspart  20 Units Subcutaneous TID WC   insulin  glargine-yfgn  40 Units Subcutaneous BID   iron polysaccharides  150 mg Oral Daily   losartan   100 mg Oral Daily   multivitamin with minerals  1 tablet Oral Daily   nutrition supplement (JUVEN)  1 packet Oral BID BM   polyethylene glycol  17 g Oral Daily   pregabalin   150 mg  Oral BID   Ensure Max Protein  11 oz Oral QHS   zinc  sulfate (50mg  elemental zinc )  220 mg Oral Daily    Continuous Infusions:   PRN Meds: bisacodyl , dextrose , ipratropium-albuterol , methocarbamol , ondansetron  (ZOFRAN ) IV, mouth rinse, oxyCODONE , oxyCODONE   Physical Exam Pulmonary:     Effort: Pulmonary effort is normal.  Skin:    General: Skin is warm and dry.  Neurological:     Mental Status: He is alert.             Vital Signs: BP 127/67 (BP Location: Right Arm)   Pulse 96   Temp 98.1 F (36.7 C)   Resp 16   Ht 6\' 5"  (1.956 m)   Wt (!) 150.2 kg   SpO2 92%   BMI 39.27 kg/m  SpO2: SpO2: 92 % O2 Device: O2 Device: Room Air O2 Flow Rate: O2 Flow Rate (L/min): 2 L/min  Intake/output summary:  Intake/Output Summary (Last 24 hours) at 11/04/2023 1308 Last data filed at 11/04/2023 1610 Gross per 24 hour  Intake 240 ml  Output 2850 ml  Net -2610 ml   LBM: Last BM Date : 11/03/23 Baseline Weight: Weight: (!) 181.4 kg Most recent weight: Weight: (!) 150.2 kg   Patient Active Problem List   Diagnosis Date Noted   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   Diabetic foot infection (HCC) 10/26/2023   HTN (hypertension) 10/26/2023   HLD (hyperlipidemia) 10/26/2023   Type 2 diabetes mellitus with peripheral neuropathy (HCC) 10/26/2023   Obesity, Class III, BMI 40-49.9 (morbid obesity) 10/26/2023   Severe sepsis (HCC) 10/26/2023   Depression 10/26/2023   Osteomyelitis of right foot (HCC) 10/26/2023   Hypoglycemia 10/26/2023   Atherosclerosis of native arteries of the extremities with ulceration (HCC) 10/20/2023   Hypertension     Failure to attend appointment 07/07/2022   Cervical radiculopathy 01/07/2022   Lumbar radiculopathy 01/07/2022   Pain syndrome, chronic 01/07/2022   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   History of lumbar spinal fusion 01/07/2022   Chronic venous stasis dermatitis of both lower extremities    Cellulitis 05/30/2021   Multiple open wounds of lower extremity, initial encounter 05/30/2021   Respiratory failure (HCC) 02/19/2018   Severe recurrent major depression with psychotic features (HCC) 01/09/2017   Congenital flat foot 01/16/2015   DDD (degenerative disc disease), lumbar 01/16/2015   Diabetes mellitus, type 2 (HCC) 01/16/2015   Hypercholesteremia 01/16/2015   Gastro-esophageal reflux disease without esophagitis 01/16/2015   Peripheral neuropathy 01/16/2015    Palliative Care Assessment & Plan    Recommendations/Plan: Would recommend increasing Lyrica  dosing for neuropathic pain. Would recommend changing Robaxin  500 mg every 8 hours as needed for muscle spasms to 750 mg every 6 hours scheduled.   Would recommend a bariatric bed as patient voices discomfort with his current bed.  Would rec mended overhead trapeze.   Discussed the importance of adequate protein intake. Code Status:    Code Status Orders  (From admission, onward)           Start     Ordered   10/26/23 2115  Full code  Continuous       Question:  By:  Answer:  Consent: discussion documented in EHR   10/26/23 2115           Code Status History     Date Active Date Inactive Code Status Order ID Comments User Context   05/30/2021 2008 06/01/2021 2059 Full Code [id number]  [place], Eric J, DO ED  01/10/2017 1131 01/15/2017 1433 Full Code [id number]  Evone Hoh, MD Inpatient   12/09/2015 1550 12/11/2015 1535 Full Code [id number]  [place] Carrel, MD Inpatient   01/27/2015 2007 01/30/2015 1345 Full Code [id number]  [place] Carrel, MD Inpatient       Thank you for allowing the Palliative Medicine Team to  assist in the care of this patient.   Meribeth Standard, NP  Please contact Palliative Medicine Team phone at [phone] for questions and concerns.

## 2023-11-04 NOTE — Progress Notes (Signed)
 6213 10mg  PO oxycodone  given at 809am, pain number 9 stated by pt, given additional 5mg  PO to equal 15mg  of oxycodone . Informed primary nurse.

## 2023-11-04 NOTE — Progress Notes (Signed)
 Physical Therapy Treatment Patient Details Name: Martin Riddle MRN: [id number] DOB: 1971-02-14 Today's Date: 11/04/2023   History of Present Illness Pt is a 53 y.o. male s/p R AKA on 10/28/23 after presenting to [facility] with severe sepsis due to R foot wound infection. RR called 5/20 with pt unresponsive requiring intubation, off vent 5/22. PMH significant for DM, atherosclerosis of native arteries of the extremities with ulceration, diabetic foot ulcer, HTN, HLD, depression, chronic venous stasis in both legs, pancreatitis, peripheral neuropathy, & morbid obesity.    PT Comments  Pt was sitting in recliner (bariatric) upon arrival. He is much calmer and does apologize for earlier behaviors/frustrations. Pt is agreeable to session and was pre-medicated prior. Pt performed STS 3 x from recliner to static standing holding wall rail. Needs vcs for encouragement and technique however once in standing was able to maintain standing for ~ 30 sec-1 min. Unsafe to attempt hopping/ gait progression at this time. After transfer training, author issued more resistive theraband to promote UE strengthening  and reviewed importance of hip extension/strengthening to prevent hip flexion contractors. Overall, pt tolerated session well and continue to progress per current POC. Dc recs remain appropriate to maximize independence and safety with all ADLs.     If plan is discharge home, recommend the following: A lot of help with walking and/or transfers;Two people to help with bathing/dressing/bathroom;Assistance with cooking/housework;Direct supervision/assist for medications management;Direct supervision/assist for financial management;Assist for transportation;Help with stairs or ramp for entrance;Supervision due to cognitive status     Equipment Recommendations  Other (comment) (Defer to next level of care)       Precautions / Restrictions Precautions Precautions: Fall Recall of Precautions/Restrictions:  Impaired Restrictions Weight Bearing Restrictions Per Provider Order: Yes RLE Weight Bearing Per Provider Order: Non weight bearing Other Position/Activity Restrictions: NWB'ing R residual limb     Mobility  Bed Mobility  General bed mobility comments: Pt was in recliner pre/post session    Transfers Overall transfer level: Needs assistance Equipment used: None (used railing in room gfor BUE support) Transfers: Sit to/from Stand Sit to Stand: Mod assist, Max assist  General transfer comment: pt stood 3 x from recliner to standing holding onto wall railing in room. Max assist on first STS but progressed to mod assist on 2nd/3rd attempt.    Ambulation/Gait  General Gait Details: Currently unable    Balance Overall balance assessment: Needs assistance Sitting-balance support: Feet supported, No upper extremity supported Sitting balance-Leahy Scale: Good     Standing balance support: Bilateral upper extremity supported, During functional activity, Reliant on assistive device for balance Standing balance-Leahy Scale: Poor Standing balance comment: Reliant on BUE support     Communication Communication Communication: No apparent difficulties  Cognition Arousal: Alert Behavior During Therapy: WFL for tasks assessed/performed   PT - Cognitive impairments: No apparent impairments    Following commands: Intact Following commands impaired: Follows one step commands with increased time, Follows multi-step commands inconsistently    Cueing Cueing Techniques: Verbal cues, Tactile cues     General Comments General comments (skin integrity, edema, etc.): Issued more resistive (black and blue) theraband and had pt perform BUE ther ex while reviewed AKA handout for R stump exercises      Pertinent Vitals/Pain Pain Assessment Pain Assessment: 0-10 Pain Score: 7  Pain Location: R residual limb Pain Descriptors / Indicators: Throbbing, Operative site guarding Pain Intervention(s):  Limited activity within patient's tolerance, Monitored during session, Premedicated before session     PT Goals (current  goals can now be found in the care plan section) Acute Rehab PT Goals Patient Stated Goal: " be able to walk again." Progress towards PT goals: Progressing toward goals    Frequency    Min 5X/week           Co-evaluation     PT goals addressed during session: Mobility/safety with mobility;Balance;Strengthening/ROM        AM-PAC PT "6 Clicks" Mobility   Outcome Measure  Help needed turning from your back to your side while in a flat bed without using bedrails?: None Help needed moving from lying on your back to sitting on the side of a flat bed without using bedrails?: A Lot Help needed moving to and from a bed to a chair (including a wheelchair)?: A Lot Help needed standing up from a chair using your arms (e.g., wheelchair or bedside chair)?: A Lot Help needed to walk in hospital room?: Total Help needed climbing 3-5 steps with a railing? : Total 6 Click Score: 12    End of Session Equipment Utilized During Treatment: Gait belt Activity Tolerance: Patient tolerated treatment well;Patient limited by pain;Patient limited by fatigue Patient left: in chair;with call bell/phone within reach;with family/visitor present;with chair alarm set Nurse Communication: Mobility status PT Visit Diagnosis: Other abnormalities of gait and mobility (R26.89);Pain Pain - Right/Left: Right Pain - part of body: Leg     Time: 1610-9604 PT Time Calculation (min) (ACUTE ONLY): 24 min  Charges:    $Therapeutic Exercise: 8-22 mins $Therapeutic Activity: 8-22 mins PT General Charges $$ ACUTE PT VISIT: 1 Visit                    Chester Costa PTA 11/04/23, 12:04 PM

## 2023-11-04 NOTE — Plan of Care (Signed)
  Problem: Coping: Goal: Ability to adjust to condition or change in health will improve Outcome: Progressing   Problem: Fluid Volume: Goal: Ability to maintain a balanced intake and output will improve Outcome: Progressing   Problem: Health Behavior/Discharge Planning: Goal: Ability to identify and utilize available resources and services will improve Outcome: Progressing Goal: Ability to manage health-related needs will improve Outcome: Progressing   Problem: Metabolic: Goal: Ability to maintain appropriate glucose levels will improve Outcome: Progressing   Problem: Nutritional: Goal: Maintenance of adequate nutrition will improve Outcome: Progressing Goal: Progress toward achieving an optimal weight will improve Outcome: Progressing   Problem: Skin Integrity: Goal: Risk for impaired skin integrity will decrease Outcome: Progressing   Problem: Tissue Perfusion: Goal: Adequacy of tissue perfusion will improve Outcome: Progressing   Problem: Clinical Measurements: Goal: Ability to avoid or minimize complications of infection will improve Outcome: Progressing   Problem: Skin Integrity: Goal: Skin integrity will improve Outcome: Progressing   Problem: Health Behavior/Discharge Planning: Goal: Ability to manage health-related needs will improve Outcome: Progressing   Problem: Clinical Measurements: Goal: Ability to maintain clinical measurements within normal limits will improve Outcome: Progressing   Problem: Activity: Goal: Risk for activity intolerance will decrease Outcome: Progressing   Problem: Nutrition: Goal: Adequate nutrition will be maintained Outcome: Progressing   Problem: Coping: Goal: Level of anxiety will decrease Outcome: Progressing   Problem: Elimination: Goal: Will not experience complications related to bowel motility Outcome: Progressing Goal: Will not experience complications related to urinary retention Outcome: Progressing    Problem: Pain Managment: Goal: General experience of comfort will improve and/or be controlled Outcome: Progressing   Problem: Safety: Goal: Ability to remain free from injury will improve Outcome: Progressing   Problem: Skin Integrity: Goal: Risk for impaired skin integrity will decrease Outcome: Progressing

## 2023-11-04 NOTE — Progress Notes (Signed)
 PT Cancellation Note  Patient Details Name: Martin Riddle MRN: [id number] DOB: 09-Aug-1970   Cancelled Treatment:     PT attempt. Pt currently very distraught. Active listening provided. RN made aware of request for pain meds. Pt threatening to leave AMA. Educated on concerns with doing this. Author will return later this date and continue to follow per current POC. MD, RN and unit manager aware all aware.    Koleen Perna 11/04/2023, 7:57 AM

## 2023-11-04 NOTE — Progress Notes (Signed)
 Occupational Therapy Treatment Patient Details Name: Martin Riddle MRN: [id number] DOB: 19-Jan-1971 Today's Date: 11/04/2023   History of present illness Pt is a 53 y.o. male s/p R AKA on 10/28/23 after presenting to [facility] with severe sepsis due to R foot wound infection. RR called 5/20 with pt unresponsive requiring intubation, off vent 5/22. PMH significant for DM, atherosclerosis of native arteries of the extremities with ulceration, diabetic foot ulcer, HTN, HLD, depression, chronic venous stasis in both legs, pancreatitis, peripheral neuropathy, & morbid obesity.   OT comments  Martin Riddle was seen for OT treatment on this date. Upon arrival to room pt in chair requesting to return to bed, agreeable to tx. Pt requires MIN A chair>bed t/f. Pt reports anxiousness with transfers 2/2 feeling like his R foot is still there. Reviewed amputee exercises, desnsititzation technqiues, and pain mgmt strategies, mild STM deficits noted as pt unable to recall being premedicated for session. Pt making progress toward goals, will continue to follow POC. Discharge recommendation remains appropriate.        If plan is discharge home, recommend the following:  Two people to help with walking and/or transfers;Two people to help with bathing/dressing/bathroom;Assist for transportation;Help with stairs or ramp for entrance   Equipment Recommendations  Other (comment) (defer)    Recommendations for Other Services      Precautions / Restrictions Precautions Precautions: Fall Recall of Precautions/Restrictions: Impaired Restrictions Weight Bearing Restrictions Per Provider Order: Yes RLE Weight Bearing Per Provider Order: Non weight bearing       Mobility Bed Mobility Overal bed mobility: Needs Assistance Bed Mobility: Sit to Supine       Sit to supine: Supervision        Transfers Overall transfer level: Needs assistance Equipment used: None Transfers: Bed to chair/wheelchair/BSC             Lateral/Scoot Transfers: Min assist       Balance Overall balance assessment: Needs assistance Sitting-balance support: Feet supported, No upper extremity supported Sitting balance-Leahy Scale: Good                                     ADL either performed or assessed with clinical judgement   ADL Overall ADL's : Needs assistance/impaired                                       General ADL Comments: MIN A for simualted BSC t/f    Extremity/Trunk Assessment              Vision       Perception     Praxis     Communication Communication Communication: No apparent difficulties   Cognition Arousal: Alert Behavior During Therapy: WFL for tasks assessed/performed Cognition: Cognition impaired             OT - Cognition Comments: mild STM deficits - does not recall when pain medication provided despite it being given 2 min prior to start of session.                 Following commands: Intact        Cueing      Exercises Other Exercises Other Exercises: reviewed amputee exercises, desnsititzation technqiues, and pain mgmt    Shoulder Instructions  Pertinent Vitals/ Pain       Pain Assessment Pain Assessment: 0-10 Pain Score: 8  Pain Location: R residual limb Pain Descriptors / Indicators: Throbbing, Operative site guarding Pain Intervention(s): Limited activity within patient's tolerance, Repositioned, Premedicated before session  Home Living                                          Prior Functioning/Environment              Frequency  Min 3X/week        Progress Toward Goals  OT Goals(current goals can now be found in the care plan section)  Progress towards OT goals: Progressing toward goals  Acute Rehab OT Goals OT Goal Formulation: With patient Time For Goal Achievement: 11/15/23 Potential to Achieve Goals: Fair ADL Goals Pt Will Perform Lower Body Bathing:  sitting/lateral leans;sit to/from stand;with adaptive equipment;with caregiver independent in assisting;with contact guard assist Pt Will Perform Lower Body Dressing: with caregiver independent in assisting;sit to/from stand;sitting/lateral leans;with adaptive equipment;with min assist Pt Will Transfer to Toilet: with transfer board;with min assist;stand pivot transfer;bedside commode;grab bars Pt Will Perform Toileting - Clothing Manipulation and hygiene: with mod assist;sitting/lateral leans;sit to/from stand;with adaptive equipment  Plan      Co-evaluation        PT goals addressed during session: Mobility/safety with mobility;Balance;Strengthening/ROM        AM-PAC OT "6 Clicks" Daily Activity     Outcome Measure   Help from another person eating meals?: None Help from another person taking care of personal grooming?: A Little Help from another person toileting, which includes using toliet, bedpan, or urinal?: A Lot Help from another person bathing (including washing, rinsing, drying)?: A Lot Help from another person to put on and taking off regular upper body clothing?: A Little Help from another person to put on and taking off regular lower body clothing?: A Lot 6 Click Score: 16    End of Session Equipment Utilized During Treatment: Gait belt  OT Visit Diagnosis: Pain;Other abnormalities of gait and mobility (R26.89);Unsteadiness on feet (R26.81);Muscle weakness (generalized) (M62.81) Pain - Right/Left: Right Pain - part of body: Leg   Activity Tolerance Patient tolerated treatment well   Patient Left in bed;with call bell/phone within reach;with bed alarm set;with family/visitor present   Nurse Communication Mobility status        Time: 1610-9604 OT Time Calculation (min): 27 min  Charges: OT General Charges $OT Visit: 1 Visit OT Treatments $Self Care/Home Management : 23-37 mins  Martin Riddle, M.S. OTR/L  11/04/23, 3:10 PM  ascom [phone]

## 2023-11-04 NOTE — Progress Notes (Signed)
 CNA notified this RN that patient is refusing all cares this morning (vital signs and blood sugar) until he speaks to Production designer, theatre/television/film and doctor. Unit manager notified in person and attending MD notified via secure chat.

## 2023-11-04 NOTE — H&P (View-Only) (Signed)
 Progress Note    11/04/2023 12:54 PM 7 Days Post-Op  Subjective:  Martin Riddle is a 53 yo male who is now post op day #7 from  right above the knee amputation. Patient endorses he is having phantom pains and some pains to the incision site.  This is normal at this point.  Patient endorses he feels better all over.  No other complaints.  Vitals are remained stable.    Vitals:   11/04/23 0454 11/04/23 0943  BP: (!) 144/81 127/67  Pulse: 90 96  Resp: 18 16  Temp: 97.8 F (36.6 C) 98.1 F (36.7 C)  SpO2: 100% 92%   Physical Exam: Cardiac:  RRR, Normal S1, S2. No  rubs, clicks or gallup's. No murmurs. Lungs: Ventilated on pressure support.  Lungs were rhonchorous throughout. Incisions: Right lower extremity AKA.  Clean dry and intact. Extremities: Right AKA dressing clean dry and intact.  No complications.  Left lower extremity with cellulitis +2 swelling.  Unable to palpate pulses.  Foot is warm to the touch. Abdomen: Positive bowel sounds throughout, soft, tender and distended. Neurologic: Intubated and sedated.  CBC    Component Value Date/Time   WBC 8.9 11/01/2023 0359   RBC 2.97 (L) 11/01/2023 0359   HGB 8.5 (L) 11/01/2023 0359   HGB 13.4 03/15/2014 0339   HCT 27.6 (L) 11/01/2023 0359   HCT 40.4 03/15/2014 0339   PLT 329 11/01/2023 0359   PLT 173 03/15/2014 0339   MCV 92.9 11/01/2023 0359   MCV 94 03/15/2014 0339   MCH 28.6 11/01/2023 0359   MCHC 30.8 11/01/2023 0359   RDW 14.7 11/01/2023 0359   RDW 13.1 03/15/2014 0339   LYMPHSABS 1.4 10/26/2023 1817   LYMPHSABS 2.2 03/12/2014 0600   MONOABS 0.9 10/26/2023 1817   MONOABS 1.3 (H) 03/12/2014 0600   EOSABS 0.1 10/26/2023 1817   EOSABS 0.0 03/12/2014 0600   BASOSABS 0.0 10/26/2023 1817   BASOSABS 0.1 03/12/2014 0600    BMET    Component Value Date/Time   NA 129 (L) 11/01/2023 2139   NA 135 (L) 03/15/2014 0339   K 4.8 11/01/2023 2139   K 3.1 (L) 03/15/2014 0339   CL 93 (L) 11/01/2023 2139   CL 104  03/15/2014 0339   CO2 26 11/01/2023 2139   CO2 24 03/15/2014 0339   GLUCOSE 445 (H) 11/01/2023 2139   GLUCOSE 127 (H) 03/15/2014 0339   BUN 29 (H) 11/01/2023 2139   BUN 9 03/15/2014 0339   CREATININE 1.00 11/01/2023 2139   CREATININE 0.64 03/15/2014 0339   CALCIUM  7.8 (L) 11/01/2023 2139   CALCIUM  8.4 (L) 03/15/2014 0339   GFRNONAA >60 11/01/2023 2139   GFRNONAA >60 03/15/2014 0339   GFRAA >60 02/28/2018 0429   GFRAA >60 03/15/2014 0339    INR    Component Value Date/Time   INR 1.2 10/26/2023 2136     Intake/Output Summary (Last 24 hours) at 11/04/2023 1254 Last data filed at 11/04/2023 1610 Gross per 24 hour  Intake 240 ml  Output 2850 ml  Net -2610 ml     Assessment/Plan:  53 y.o. male is s/p right above the knee amputation.  7 Days Post-Op   PLAN  Patient is resting comfortably in bed this morning.  Right AKA healing as expected.  Dressing remains intact.  Vascular surgery plans on taking the patient to the vascular lab tomorrow on 11/05/2023 for left lower extremity angiogram with possible intervention.  I discussed again today in detail with the  patient the procedure, benefits, risk, complications.  Patient verbalizes understanding wishes to proceed.  Answered all his questions this afternoon.  Patient will be made n.p.o. after midnight tonight for procedure tomorrow.  I discussed the case in detail with Dr. Mikki Alexander MD and he agrees with the plan.   DVT prophylaxis:  ASA 81 mg daily Lipitor 20 mg daily and heparin  5000 units subcu every 8 hours    Annamaria [facility] 11/04/2023 12:54 PM

## 2023-11-04 NOTE — Progress Notes (Signed)
 PROGRESS NOTE    Martin Riddle  WUJ:[id number] DOB: September 17, 1970 DOA: 10/26/2023 PCP: Thomasene Flemings, NP   Assessment & Plan:   Principal Problem:   Diabetic foot infection (HCC) Active Problems:   Osteomyelitis of right foot (HCC)   Atherosclerosis of native arteries of the extremities with ulceration (HCC)   Severe sepsis (HCC)   Type 2 diabetes mellitus with peripheral neuropathy (HCC)   HTN (hypertension)   HLD (hyperlipidemia)   Depression   Obesity, Class III, BMI 40-49.9 (morbid obesity)   Hypoglycemia   Gas gangrene (HCC)  Assessment and Plan:  Acute hypoxic and hypercapnic respiratory failure: Improved.  S/p extubation on 10/29/2023. Weaned off of supplemental oxygen.   Possible OSA: will need outpatient sleep study to confirm dx    Septic shock: secondary to right foot osteomyelitis with gangrene of first metatarsophalangeal joint. See Dr. Wardell Guys note on how pt met septic shock criteria. Resolved   Osteomyelitis: w/ gangrene. 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 Completed abx course w/ unasyn, vanco until 11/02/23. Increase home dose of pregabalin  for increased right AKA stump.   AKI: Resolved  Hyponatremia: labile. Will continue to monitor    DM2: poorly controlled, HbA1c 8.9. Hypoglycemia episodes have resolved. Continue on glargine, aspart, SSI w/ accuchecks. Pt was d/c from Dr. Lorelei Rogers, endo, b/c of medical nonadherence    Acute toxic metabolic encephalopathy: Improved  Depression: severity unknown. Restart home dose of buspar     General weakness: PT/OT recs CIR  Morbid obesity: Complicates overall care & prognosis      DVT prophylaxis:  lovenox  Code Status: full  Family Communication:  Disposition Plan: possibly d/c to CIR   Level of care: Med-Surg Consultants:  ICU ID  Procedures:   Antimicrobials:    Subjective: Pt c/o stump pain   Objective: Vitals:   11/03/23 0900 11/03/23 1550 11/03/23 2015 11/04/23 0454  BP: (!) 146/70  131/86  (!) 144/81  Pulse: (!) 103 87  90  Resp:  17  18  Temp: 97.7 F (36.5 C) 99.3 F (37.4 C)  97.8 F (36.6 C)  TempSrc: Axillary     SpO2: 99% 97% 95% 100%  Weight:      Height:        Intake/Output Summary (Last 24 hours) at 11/04/2023 0810 Last data filed at 11/04/2023 0700 Gross per 24 hour  Intake --  Output 3950 ml  Net -3950 ml   Filed Weights   10/26/23 1804 10/27/23 1247  Weight: (!) 181.4 kg (!) 150.2 kg    Examination:  General exam: Appears uncomfortable. Morbidly obese Respiratory system: decreased breath sounds b/l  Cardiovascular system: S1 & S2+. No rubs, gallops or clicks.  Gastrointestinal system: Abdomen is nondistended, soft and nontender.  Normal bowel sounds heard. Central nervous system: Alert and awake.  Moves all extremities  Psychiatry: Judgement and insight appears improved. Flat mood and affect    Data Reviewed: I have personally reviewed following labs and imaging studies  CBC: Recent Labs  Lab 10/29/23 0331 10/30/23 0518 10/31/23 0322 11/01/23 0359  WBC 8.6 9.0 9.5 8.9  HGB 8.7* 9.0* 8.6* 8.5*  HCT 28.7* 28.7* 27.7* 27.6*  MCV 95.3 94.4 93.6 92.9  PLT 278 282 292 329   Basic Metabolic Panel: Recent Labs  Lab 10/29/23 0331 10/30/23 0518 10/31/23 0322 11/01/23 0359 11/01/23 2139  NA 134* 134* 132* 133* 129*  K 4.9 4.5 4.7 4.7 4.8  CL 101 99 95* 95* 93*  CO2 26 28  26 28 26   GLUCOSE 99 250* 295* 323* 445*  BUN 32* 19 24* 22* 29*  CREATININE 0.81 0.82 0.75 0.76 1.00  CALCIUM  8.0* 8.3* 8.6* 8.7* 7.8*  MG 2.4 2.1  --   --   --   PHOS 4.1 2.2*  --   --   --    GFR: Estimated Creatinine Clearance: 137.1 mL/min (by C-G formula based on SCr of 1 mg/dL). Liver Function Tests: No results for input(s): "AST", "ALT", "ALKPHOS", "BILITOT", "PROT", "ALBUMIN" in the last 168 hours. No results for input(s): "LIPASE", "AMYLASE" in the last 168 hours. No results for input(s): "AMMONIA" in the last 168 hours. Coagulation  Profile: No results for input(s): "INR", "PROTIME" in the last 168 hours. Cardiac Enzymes: No results for input(s): "CKTOTAL", "CKMB", "CKMBINDEX", "TROPONINI" in the last 168 hours. BNP (last 3 results) No results for input(s): "PROBNP" in the last 8760 hours. HbA1C: No results for input(s): "HGBA1C" in the last 72 hours. CBG: Recent Labs  Lab 11/02/23 2136 11/03/23 0747 11/03/23 1156 11/03/23 1552 11/03/23 2051  GLUCAP 393* 408* 342* 304* 276*   Lipid Profile: Recent Labs    11/03/23 0840  TRIG 702*   Thyroid Function Tests: No results for input(s): "TSH", "T4TOTAL", "FREET4", "T3FREE", "THYROIDAB" in the last 72 hours. Anemia Panel: No results for input(s): "VITAMINB12", "FOLATE", "FERRITIN", "TIBC", "IRON", "RETICCTPCT" in the last 72 hours. Sepsis Labs: No results for input(s): "PROCALCITON", "LATICACIDVEN" in the last 168 hours.  Recent Results (from the past 240 hours)  Blood culture (single)     Status: None   Collection Time: 10/26/23  6:17 PM   Specimen: BLOOD RIGHT HAND  Result Value Ref Range Status   Specimen Description BLOOD RIGHT HAND  Final   Special Requests   Final    BOTTLES DRAWN AEROBIC AND ANAEROBIC Blood Culture results may not be optimal due to an inadequate volume of blood received in culture bottles   Culture   Final    NO GROWTH 5 DAYS Performed at [facility], [address] [place], [place] [address]    Report Status 10/31/2023 FINAL  Final  Culture, blood (single)     Status: None   Collection Time: 10/26/23  8:04 PM   Specimen: BLOOD  Result Value Ref Range Status   Specimen Description BLOOD LA  Final   Special Requests   Final    BOTTLES DRAWN AEROBIC AND ANAEROBIC Blood Culture adequate volume   Culture   Final    NO GROWTH 5 DAYS Performed at [facility], [address] [place], [place] [address]    Report Status 10/31/2023 FINAL  Final  Resp panel by RT-PCR (RSV, Flu A&B, Covid) Anterior Nasal Swab      Status: None   Collection Time: 10/27/23  9:53 AM   Specimen: Anterior Nasal Swab  Result Value Ref Range Status   SARS Coronavirus 2 by RT PCR NEGATIVE NEGATIVE Final    Comment: (NOTE) SARS-CoV-2 target nucleic acids are NOT DETECTED.  The SARS-CoV-2 RNA is generally detectable in upper respiratory specimens during the acute phase of infection. The lowest concentration of SARS-CoV-2 viral copies this assay can detect is 138 copies/mL. A negative result does not preclude SARS-Cov-2 infection and should not be used as the sole basis for treatment or other patient management decisions. A negative result may occur with  improper specimen collection/handling, submission of specimen other than nasopharyngeal swab, presence of viral mutation(s) within the areas targeted by this assay,  and inadequate number of viral copies(<138 copies/mL). A negative result must be combined with clinical observations, patient history, and epidemiological information. The expected result is Negative.  Fact Sheet for Patients:  BloggerCourse.com  Fact Sheet for Healthcare Providers:  SeriousBroker.it  This test is no t yet approved or cleared by the [place]  FDA and  has been authorized for detection and/or diagnosis of SARS-CoV-2 by FDA under an Emergency Use Authorization (EUA). This EUA will remain  in effect (meaning this test can be used) for the duration of the COVID-19 declaration under Section 564(b)(1) of the Act, 21 U.S.C.section 360bbb-3(b)(1), unless the authorization is terminated  or revoked sooner.       Influenza A by PCR NEGATIVE NEGATIVE Final   Influenza B by PCR NEGATIVE NEGATIVE Final    Comment: (NOTE) The Xpert Xpress SARS-CoV-2/FLU/RSV plus assay is intended as an aid in the diagnosis of influenza from Nasopharyngeal swab specimens and should not be used as a sole basis for treatment. Nasal washings and aspirates are  unacceptable for Xpert Xpress SARS-CoV-2/FLU/RSV testing.  Fact Sheet for Patients: BloggerCourse.com  Fact Sheet for Healthcare Providers: SeriousBroker.it  This test is not yet approved or cleared by the [place]  FDA and has been authorized for detection and/or diagnosis of SARS-CoV-2 by FDA under an Emergency Use Authorization (EUA). This EUA will remain in effect (meaning this test can be used) for the duration of the COVID-19 declaration under Section 564(b)(1) of the Act, 21 U.S.C. section 360bbb-3(b)(1), unless the authorization is terminated or revoked.     Resp Syncytial Virus by PCR NEGATIVE NEGATIVE Final    Comment: (NOTE) Fact Sheet for Patients: BloggerCourse.com  Fact Sheet for Healthcare Providers: SeriousBroker.it  This test is not yet approved or cleared by the [place]  FDA and has been authorized for detection and/or diagnosis of SARS-CoV-2 by FDA under an Emergency Use Authorization (EUA). This EUA will remain in effect (meaning this test can be used) for the duration of the COVID-19 declaration under Section 564(b)(1) of the Act, 21 U.S.C. section 360bbb-3(b)(1), unless the authorization is terminated or revoked.  Performed at [facility], [address] [place], [place] [address]   MRSA Next Gen by PCR, Nasal     Status: None   Collection Time: 10/27/23 12:59 PM   Specimen: Nasal Mucosa; Nasal Swab  Result Value Ref Range Status   MRSA by PCR Next Gen NOT DETECTED NOT DETECTED Final    Comment: (NOTE) The GeneXpert MRSA Assay (FDA approved for NASAL specimens only), is one component of a comprehensive MRSA colonization surveillance program. It is not intended to diagnose MRSA infection nor to guide or monitor treatment for MRSA infections. Test performance is not FDA approved in patients less than 60 years old. Performed at  [facility], [address], [place], [place] [address]          Radiology Studies: No results found.      Scheduled Meds:  acetaminophen   500 mg Oral Q6H   arformoterol  15 mcg Nebulization BID   ascorbic acid  500 mg Oral BID   aspirin   81 mg Oral Daily   atorvastatin   20 mg Oral Daily   budesonide  (PULMICORT ) nebulizer solution  0.25 mg Nebulization BID   vitamin B-12  1,000 mcg Oral Daily   docusate sodium   100 mg Oral BID   enoxaparin  (LOVENOX ) injection  0.5 mg/kg Subcutaneous Q24H   famotidine   20 mg Oral Daily   insulin  aspart  0-15 Units Subcutaneous TID WC   insulin  aspart  0-5 Units Subcutaneous QHS   insulin  aspart  20 Units Subcutaneous TID WC   insulin  glargine-yfgn  40 Units Subcutaneous BID   iron polysaccharides  150 mg Oral Daily   losartan   100 mg Oral Daily   multivitamin with minerals  1 tablet Oral Daily   nutrition supplement (JUVEN)  1 packet Oral BID BM   polyethylene glycol  17 g Oral Daily   pregabalin   150 mg Oral BID   Ensure Max Protein  11 oz Oral QHS   zinc  sulfate (50mg  elemental zinc )  220 mg Oral Daily   Continuous Infusions:   LOS: 9 days       Alphonsus Jeans, MD [facility] Pager [phone]  If 7PM-7AM, please contact night-coverage [email or web address] Password [email or web address] 11/04/2023, 8:10 AM

## 2023-11-05 ENCOUNTER — Encounter
Admission: EM | Disposition: A | Discharge status: Skilled Nursing Facility | Payer: Self-pay | Source: Home / Self Care | Attending: Internal Medicine

## 2023-11-05 DIAGNOSIS — L089 Local infection of the skin and subcutaneous tissue, unspecified: Secondary | ICD-10-CM | POA: Diagnosis not present

## 2023-11-05 DIAGNOSIS — L97929 Non-pressure chronic ulcer of unspecified part of left lower leg with unspecified severity: Secondary | ICD-10-CM

## 2023-11-05 DIAGNOSIS — E11628 Type 2 diabetes mellitus with other skin complications: Secondary | ICD-10-CM | POA: Diagnosis not present

## 2023-11-05 DIAGNOSIS — I70249 Atherosclerosis of native arteries of left leg with ulceration of unspecified site: Secondary | ICD-10-CM

## 2023-11-05 HISTORY — PX: LOWER EXTREMITY ANGIOGRAPHY: CATH118251

## 2023-11-05 LAB — BASIC METABOLIC PANEL WITH GFR
Anion gap: 6 (ref 5–15)
BUN: 40 mg/dL — ABNORMAL HIGH (ref 6–20)
CO2: 29 mmol/L (ref 22–32)
Calcium: 9.2 mg/dL (ref 8.9–10.3)
Chloride: 99 mmol/L (ref 98–111)
Creatinine, Ser: 1.12 mg/dL (ref 0.61–1.24)
GFR, Estimated: >60 mL/min (ref >60)
Glucose, Bld: 433 mg/dL — ABNORMAL HIGH (ref 70–99)
Potassium: 5.6 mmol/L — ABNORMAL HIGH (ref 3.5–5.1)
Sodium: 134 mmol/L — ABNORMAL LOW (ref 135–145)

## 2023-11-05 LAB — GLUCOSE, CAPILLARY
Glucose-Capillary: 400 mg/dL — ABNORMAL HIGH (ref 70–99)
Glucose-Capillary: 427 mg/dL — ABNORMAL HIGH (ref 70–99)
Glucose-Capillary: 366 mg/dL — ABNORMAL HIGH (ref 70–99)
Glucose-Capillary: 307 mg/dL — ABNORMAL HIGH (ref 70–99)
Glucose-Capillary: 348 mg/dL — ABNORMAL HIGH (ref 70–99)

## 2023-11-05 LAB — CBC
HCT: 43.3 % (ref 39.0–52.0)
Hemoglobin: 13.4 g/dL (ref 13.0–17.0)
MCH: 28.7 pg (ref 26.0–34.0)
MCHC: 30.9 g/dL (ref 30.0–36.0)
MCV: 92.7 fL (ref 80.0–100.0)
Platelets: 252 10*3/uL (ref 150–400)
RBC: 4.67 MIL/uL (ref 4.22–5.81)
RDW: 16.9 % — ABNORMAL HIGH (ref 11.5–15.5)
WBC: 6.8 10*3/uL (ref 4.0–10.5)
nRBC: 0 % (ref 0.0–0.2)

## 2023-11-05 LAB — POTASSIUM: Potassium: 4.5 mmol/L (ref 3.5–5.1)

## 2023-11-05 SURGERY — LOWER EXTREMITY ANGIOGRAPHY
Anesthesia: Moderate Sedation | Laterality: Left

## 2023-11-05 MED ORDER — INSULIN ASPART 100 UNIT/ML IJ SOLN
0.0000 [IU] | Freq: Three times a day (TID) | INTRAMUSCULAR | Status: DC
  Administered 2023-11-05 (×2): 11 [IU] via SUBCUTANEOUS
  Administered 2023-11-06: 15 [IU] via SUBCUTANEOUS
  Administered 2023-11-06: 11 [IU] via SUBCUTANEOUS
  Administered 2023-11-06 – 2023-11-08 (×7): 15 [IU] via SUBCUTANEOUS
  Filled 2023-11-05 (×11): qty 1

## 2023-11-05 MED ORDER — HEPARIN SODIUM (PORCINE) 1000 UNIT/ML IJ SOLN
INTRAMUSCULAR | Status: AC
  Filled 2023-11-05: qty 10

## 2023-11-05 MED ORDER — HEPARIN (PORCINE) IN NACL 2000-0.9 UNIT/L-% IV SOLN
INTRAVENOUS | Status: DC | PRN
Start: 2023-11-05 — End: 2023-11-05
  Administered 2023-11-05: 1000 mL

## 2023-11-05 MED ORDER — MIDAZOLAM HCL 2 MG/ML PO SYRP: 8.0000 mg | ORAL_SOLUTION | Freq: Once | ORAL | Status: DC | PRN

## 2023-11-05 MED ORDER — CLOPIDOGREL BISULFATE 75 MG PO TABS
75.0000 mg | ORAL_TABLET | Freq: Every day | ORAL | Status: DC
  Administered 2023-11-05 – 2023-11-09 (×5): 75 mg via ORAL
  Filled 2023-11-05 (×5): qty 1

## 2023-11-05 MED ORDER — INSULIN GLARGINE-YFGN 100 UNIT/ML ~~LOC~~ SOLN
50.0000 [IU] | Freq: Two times a day (BID) | SUBCUTANEOUS | Status: DC
  Administered 2023-11-05 (×2): 50 [IU] via SUBCUTANEOUS
  Filled 2023-11-05 (×3): qty 0.5

## 2023-11-05 MED ORDER — HEPARIN SODIUM (PORCINE) 1000 UNIT/ML IJ SOLN
INTRAMUSCULAR | Status: DC | PRN
Start: 2023-11-05 — End: 2023-11-05
  Administered 2023-11-05: 5000 [IU] via INTRAVENOUS

## 2023-11-05 MED ORDER — SODIUM CHLORIDE 0.9 % IV SOLN: INTRAVENOUS | Status: DC

## 2023-11-05 MED ORDER — SODIUM ZIRCONIUM CYCLOSILICATE 10 G PO PACK
10.0000 g | PACK | Freq: Once | ORAL | Status: AC
  Administered 2023-11-05: 10 g via ORAL
  Filled 2023-11-05: qty 1

## 2023-11-05 MED ORDER — FENTANYL CITRATE (PF) 100 MCG/2ML IJ SOLN
INTRAMUSCULAR | Status: DC | PRN
  Administered 2023-11-05: 50 ug via INTRAVENOUS
  Administered 2023-11-05 (×2): 25 ug via INTRAVENOUS

## 2023-11-05 MED ORDER — FENTANYL CITRATE (PF) 100 MCG/2ML IJ SOLN
INTRAMUSCULAR | Status: AC
  Filled 2023-11-05: qty 2

## 2023-11-05 MED ORDER — CEFAZOLIN SODIUM-DEXTROSE 2-4 GM/100ML-% IV SOLN
2.0000 g | INTRAVENOUS | Status: DC
  Filled 2023-11-05: qty 100

## 2023-11-05 MED ORDER — FENTANYL CITRATE PF 50 MCG/ML IJ SOSY
PREFILLED_SYRINGE | INTRAMUSCULAR | Status: AC
  Filled 2023-11-05: qty 1

## 2023-11-05 MED ORDER — INSULIN ASPART 100 UNIT/ML IJ SOLN
25.0000 [IU] | Freq: Three times a day (TID) | INTRAMUSCULAR | Status: DC
  Administered 2023-11-05 – 2023-11-06 (×2): 25 [IU] via SUBCUTANEOUS
  Filled 2023-11-05 (×2): qty 1

## 2023-11-05 MED ORDER — MIDAZOLAM HCL 2 MG/2ML IJ SOLN
INTRAMUSCULAR | Status: DC | PRN
Start: 2023-11-05 — End: 2023-11-05
  Administered 2023-11-05 (×2): .5 mg via INTRAVENOUS
  Administered 2023-11-05: 2 mg via INTRAVENOUS

## 2023-11-05 MED ORDER — DIPHENHYDRAMINE HCL 50 MG/ML IJ SOLN: 50.0000 mg | Freq: Once | INTRAMUSCULAR | Status: DC | PRN

## 2023-11-05 MED ORDER — METHYLPREDNISOLONE SODIUM SUCC 125 MG IJ SOLR: 125.0000 mg | Freq: Once | INTRAMUSCULAR | Status: DC | PRN

## 2023-11-05 MED ORDER — INSULIN ASPART 100 UNIT/ML IJ SOLN
0.0000 [IU] | INTRAMUSCULAR | Status: DC
  Administered 2023-11-05: 15 [IU] via SUBCUTANEOUS

## 2023-11-05 MED ORDER — CEFAZOLIN SODIUM-DEXTROSE 2-4 GM/100ML-% IV SOLN
INTRAVENOUS | Status: AC
  Filled 2023-11-05: qty 100

## 2023-11-05 MED ORDER — LIDOCAINE-EPINEPHRINE (PF) 1 %-1:200000 IJ SOLN
INTRAMUSCULAR | Status: DC | PRN
Start: 2023-11-05 — End: 2023-11-05
  Administered 2023-11-05: 10 mL

## 2023-11-05 MED ORDER — MIDAZOLAM HCL 5 MG/5ML IJ SOLN
INTRAMUSCULAR | Status: AC
  Filled 2023-11-05: qty 5

## 2023-11-05 MED ORDER — FAMOTIDINE 20 MG PO TABS: 40.0000 mg | ORAL_TABLET | Freq: Once | ORAL | Status: DC | PRN

## 2023-11-05 MED ORDER — IODIXANOL 320 MG/ML IV SOLN
INTRAVENOUS | Status: DC | PRN
Start: 2023-11-05 — End: 2023-11-05
  Administered 2023-11-05: 35 mL via INTRA_ARTERIAL

## 2023-11-05 SURGICAL SUPPLY — 15 items
BALLOON LUTONIX DCB 6X60X130 (BALLOONS) IMPLANT
BALLOON LUTONIX DCB 6X80X130 (BALLOONS) IMPLANT
CATH ANGIO 5F PIGTAIL 65CM (CATHETERS) IMPLANT
COVER PROBE ULTRASOUND 5X96 (MISCELLANEOUS) IMPLANT
DEVICE PRESTO INFLATION (MISCELLANEOUS) IMPLANT
DEVICE STARCLOSE SE CLOSURE (Vascular Products) IMPLANT
GLIDEWIRE ADV .035X260CM (WIRE) IMPLANT
PACK ANGIOGRAPHY (CUSTOM PROCEDURE TRAY) ×1 IMPLANT
PANNUS RETENTION SYSTEM 2 PAD (MISCELLANEOUS) IMPLANT
SHEATH BRITE TIP 5FRX11 (SHEATH) IMPLANT
SHEATH RAABE 6FRX70 (SHEATH) IMPLANT
STENT LIFESTENT 5F 7X60X135 (Permanent Stent) IMPLANT
SYR MEDRAD MARK 7 150ML (SYRINGE) IMPLANT
TUBING CONTRAST HIGH PRESS 72 (TUBING) IMPLANT
WIRE J 3MM .035X145CM (WIRE) IMPLANT

## 2023-11-05 NOTE — Progress Notes (Signed)
 OT Cancellation Note  Patient Details Name: Martin Riddle MRN: [id number] DOB: Jan 12, 1971   Cancelled Treatment:    Reason Eval/Treat Not Completed: Patient at procedure or test/ unavailable. Pt off the floor, per chart scheduled for angiogram. Will re-attempt OT tx at later date/time as medically appropriate.   [name], MPH, MS, OTR/L ascom [phone] 11/05/23, 1:44 PM

## 2023-11-05 NOTE — Progress Notes (Signed)
 Daily Progress Note   Patient Name: Martin Riddle       Date: 11/05/2023 DOB: Mar 19, 1971  Age: 53 y.o. MRN#: [id number] Attending Physician: Alphonsus Jeans, MD Primary Care Physician: Thomasene Flemings, NP Admit Date: 10/26/2023  Reason for Consultation/Follow-up: Establishing goals of care  Subjective: Notes and labs reviewed.  Case discussed with physical therapy who states patient is making progress with mobility.    Into see patient.  Patient is currently sitting on the side of the bed, no family at bedside.  He is currently talking on the phone and discussing frustrations regarding his hospitalization and about staff interactions.  Lower extremity angiography pending.   PMT will shadow for needs.  Length of Stay: 10  Current Medications: Scheduled Meds:   acetaminophen   500 mg Oral Q6H   arformoterol  15 mcg Nebulization BID   ascorbic acid  500 mg Oral BID   aspirin   81 mg Oral Daily   atorvastatin   20 mg Oral Daily   bisacodyl   5 mg Oral Daily   budesonide  (PULMICORT ) nebulizer solution  0.25 mg Nebulization BID   busPIRone   30 mg Oral TID   vitamin B-12  1,000 mcg Oral Daily   docusate sodium   100 mg Oral BID   enoxaparin  (LOVENOX ) injection  0.5 mg/kg Subcutaneous Q24H   famotidine   20 mg Oral Daily   insulin  aspart  0-15 Units Subcutaneous Q4H   insulin  aspart  25 Units Subcutaneous TID WC   insulin  glargine-yfgn  50 Units Subcutaneous BID   iron polysaccharides  150 mg Oral Daily   losartan   100 mg Oral Daily   multivitamin with minerals  1 tablet Oral Daily   nutrition supplement (JUVEN)  1 packet Oral BID BM   polyethylene glycol  17 g Oral Daily   pregabalin   200 mg Oral BID   Ensure Max Protein  11 oz Oral BID   zinc  sulfate (50mg  elemental zinc )  220 mg  Oral Daily    Continuous Infusions:   PRN Meds: bisacodyl , dextrose , ipratropium-albuterol , methocarbamol , ondansetron  (ZOFRAN ) IV, mouth rinse, oxyCODONE , oxyCODONE   Physical Exam Pulmonary:     Effort: Pulmonary effort is normal.  Neurological:     Mental Status: He is alert.             Vital Signs: BP Aaron Aas)  134/90 (BP Location: Right Arm)   Pulse 96   Temp 98.1 F (36.7 C)   Resp 16   Ht 6\' 5"  (1.956 m)   Wt (!) 150.2 kg   SpO2 93%   BMI 39.27 kg/m  SpO2: SpO2: 93 % O2 Device: O2 Device: Room Air O2 Flow Rate: O2 Flow Rate (L/min): 2 L/min  Intake/output summary:  Intake/Output Summary (Last 24 hours) at 11/05/2023 1253 Last data filed at 11/05/2023 1000 Gross per 24 hour  Intake 237 ml  Output 3650 ml  Net -3413 ml   LBM: Last BM Date : 11/04/23 Baseline Weight: Weight: (!) 181.4 kg Most recent weight: Weight: (!) 150.2 kg    Patient Active Problem List   Diagnosis Date Noted   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   Diabetic foot infection (HCC) 10/26/2023   HTN (hypertension) 10/26/2023   HLD (hyperlipidemia) 10/26/2023   Type 2 diabetes mellitus with peripheral neuropathy (HCC) 10/26/2023   Obesity, Class III, BMI 40-49.9 (morbid obesity) 10/26/2023   Severe sepsis (HCC) 10/26/2023   Depression 10/26/2023   Osteomyelitis of right foot (HCC) 10/26/2023   Hypoglycemia 10/26/2023   Atherosclerosis of native arteries of the extremities with ulceration (HCC) 10/20/2023   Hypertension    Failure to attend appointment 07/07/2022   Cervical radiculopathy 01/07/2022   Lumbar radiculopathy 01/07/2022   Pain syndrome, chronic 01/07/2022   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   History of lumbar spinal fusion 01/07/2022   Chronic venous stasis dermatitis of both lower extremities    Cellulitis 05/30/2021   Multiple open wounds of lower extremity, initial encounter 05/30/2021   Respiratory failure (HCC) 02/19/2018   Severe recurrent major depression with  psychotic features (HCC) 01/09/2017   Congenital flat foot 01/16/2015   DDD (degenerative disc disease), lumbar 01/16/2015   Diabetes mellitus, type 2 (HCC) 01/16/2015   Hypercholesteremia 01/16/2015   Gastro-esophageal reflux disease without esophagitis 01/16/2015   Peripheral neuropathy 01/16/2015    Palliative Care Assessment & Plan   Recommendations/Plan: PMT will shadow for needs.  Code Status:    Code Status Orders  (From admission, onward)           Start     Ordered   10/26/23 2115  Full code  Continuous       Question:  By:  Answer:  Consent: discussion documented in EHR   10/26/23 2115           Code Status History     Date Active Date Inactive Code Status Order ID Comments User Context   05/30/2021 2008 06/01/2021 2059 Full Code [id number]  [place], Eric J, DO ED   01/10/2017 1131 01/15/2017 1433 Full Code [id number]  Evone Hoh, MD Inpatient   12/09/2015 1550 12/11/2015 1535 Full Code [id number]  [place] Carrel, MD Inpatient   01/27/2015 2007 01/30/2015 1345 Full Code [id number]  [place] Carrel, MD Inpatient     Thank you for allowing the Palliative Medicine Team to assist in the care of this patient.    Meribeth Standard, NP  Please contact Palliative Medicine Team phone at [phone] for questions and concerns.

## 2023-11-05 NOTE — [place] FL2 (Signed)
 [place]  MEDICAID FL2 LEVEL OF CARE FORM     IDENTIFICATION  Patient Name: Martin Riddle Birthdate: 11-Oct-1970 Sex: male Admission Date (Current Location): 10/26/2023  [facility] [place] and [place] Number:  Chiropodist and Address:  [facility], [address], [place], [place] [address]      Provider Number: [id number]  Attending Physician Name and Address:  Alphonsus Jeans, MD  Relative Name and Phone Number:  Spouse: Esequiel Hector, [phone]    Current Level of Care: Hospital Recommended Level of Care: Skilled Nursing Facility Prior Approval Number:    Date Approved/Denied:   PASRR Number:    Discharge Plan: SNF    Current Diagnoses: Patient Active Problem List   Diagnosis Date Noted   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   Diabetic foot infection (HCC) 10/26/2023   HTN (hypertension) 10/26/2023   HLD (hyperlipidemia) 10/26/2023   Type 2 diabetes mellitus with peripheral neuropathy (HCC) 10/26/2023   Obesity, Class III, BMI 40-49.9 (morbid obesity) 10/26/2023   Severe sepsis (HCC) 10/26/2023   Depression 10/26/2023   Osteomyelitis of right foot (HCC) 10/26/2023   Hypoglycemia 10/26/2023   Atherosclerosis of native arteries of the extremities with ulceration (HCC) 10/20/2023   Hypertension    Failure to attend appointment 07/07/2022   Cervical radiculopathy 01/07/2022   Lumbar radiculopathy 01/07/2022   Pain syndrome, chronic 01/07/2022   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   History of lumbar spinal fusion 01/07/2022   Chronic venous stasis dermatitis of both lower extremities    Cellulitis 05/30/2021   Multiple open wounds of lower extremity, initial encounter 05/30/2021   Respiratory failure (HCC) 02/19/2018   Severe recurrent major depression with psychotic features (HCC) 01/09/2017   Congenital flat foot 01/16/2015   DDD (degenerative disc disease), lumbar 01/16/2015   Diabetes mellitus, type 2 (HCC) 01/16/2015    Hypercholesteremia 01/16/2015   Gastro-esophageal reflux disease without esophagitis 01/16/2015   Peripheral neuropathy 01/16/2015    Orientation RESPIRATION BLADDER Height & Weight     Self, Time, Situation, Place  Normal Continent Weight: (!) 150.2 kg Height:  6\' 5"  (195.6 cm)  BEHAVIORAL SYMPTOMS/MOOD NEUROLOGICAL BOWEL NUTRITION STATUS      Continent Diet (Carb-Modified)  AMBULATORY STATUS COMMUNICATION OF NEEDS Skin   Extensive Assist Verbally PU Stage and Appropriate Care       PU Stage 4 Dressing: Daily (Cleanse R foot with Vashe wound cleanser Timm Foot [phone]) do not rinse and allow to air dry.  Apply Xeroform gauze ([place]son [phone])  to dorsal and plantar aspect of foot to include all toes.  Cover with dry gauze and Kerlix roll gauze.)               Personal Care Assistance Level of Assistance  Bathing, Feeding, Dressing Bathing Assistance: Limited assistance Feeding assistance: Independent Dressing Assistance: Limited assistance     Functional Limitations Info             SPECIAL CARE FACTORS FREQUENCY  PT (By licensed PT), OT (By licensed OT)     PT Frequency: 5 times per week OT Frequency: 5 times per week            Contractures Contractures Info: Not present    Additional Factors Info  Code Status, Allergies Code Status Info: Full Code Allergies Info: Morphine, Codeine           Current Medications (11/05/2023):  This is the current hospital active medication list Current Facility-Administered Medications  Medication Dose Route Frequency Provider Last  Rate Last Admin   acetaminophen  (TYLENOL ) tablet 500 mg  500 mg Oral Q6H Sheril Dines, MD   500 mg at 11/05/23 4098   arformoterol (BROVANA) nebulizer solution 15 mcg  15 mcg Nebulization BID Sheril Dines, MD   15 mcg at 11/04/23 2003   ascorbic acid (VITAMIN C) tablet 500 mg  500 mg Oral BID Sheril Dines, MD   500 mg at 11/04/23 2220   aspirin  chewable tablet 81 mg  81 mg Oral Daily Sheril Dines, MD   81 mg at 11/04/23 1191   atorvastatin  (LIPITOR) tablet 20 mg  20 mg Oral Daily Ayiku, Bernard, MD   20 mg at 11/04/23 4782   bisacodyl  (DULCOLAX) EC tablet 5 mg  5 mg Oral Daily Alphonsus Jeans, MD   5 mg at 11/04/23 1325   bisacodyl  (DULCOLAX) suppository 10 mg  10 mg Rectal Daily PRN Sheril Dines, MD       budesonide  (PULMICORT ) nebulizer solution 0.25 mg  0.25 mg Nebulization BID Sheril Dines, MD   0.25 mg at 11/05/23 9562   busPIRone  (BUSPAR ) tablet 30 mg  30 mg Oral TID Alphonsus Jeans, MD   30 mg at 11/04/23 2221   cyanocobalamin (VITAMIN B12) tablet 1,000 mcg  1,000 mcg Oral Daily Sheril Dines, MD   1,000 mcg at 11/04/23 0936   dextrose  50 % solution 50 mL  50 mL Intravenous PRN Sheril Dines, MD   50 mL at 10/28/23 2336   docusate sodium  (COLACE) capsule 100 mg  100 mg Oral BID Sheril Dines, MD   100 mg at 11/04/23 1308   enoxaparin  (LOVENOX ) injection 75 mg  0.5 mg/kg Subcutaneous Q24H Sheril Dines, MD   75 mg at 11/04/23 2222   famotidine  (PEPCID ) tablet 20 mg  20 mg Oral Daily Ayiku, Bernard, MD   20 mg at 11/04/23 6578   insulin  aspart (novoLOG ) injection 0-15 Units  0-15 Units Subcutaneous TID WC Sheril Dines, MD   15 Units at 11/05/23 4696   insulin  aspart (novoLOG ) injection 0-5 Units  0-5 Units Subcutaneous QHS Sheril Dines, MD   3 Units at 11/04/23 2222   insulin  aspart (novoLOG ) injection 20 Units  20 Units Subcutaneous TID WC Sheril Dines, MD   20 Units at 11/04/23 1809   insulin  glargine-yfgn (SEMGLEE ) injection 40 Units  40 Units Subcutaneous BID Sheril Dines, MD   40 Units at 11/04/23 2222   ipratropium-albuterol  (DUONEB) 0.5-2.5 (3) MG/3ML nebulizer solution 3 mL  3 mL Nebulization Q6H PRN Sheril Dines, MD       iron polysaccharides (NIFEREX) capsule 150 mg  150 mg Oral Daily Ayiku, Bernard, MD   150 mg at 11/04/23 0941   losartan  (COZAAR ) tablet 100 mg  100 mg Oral Daily Ayiku, Bernard, MD   100 mg at 11/04/23 2952   methocarbamol   (ROBAXIN ) tablet 500 mg  500 mg Oral Q8H PRN Sheril Dines, MD   500 mg at 11/05/23 0039   multivitamin with minerals tablet 1 tablet  1 tablet Oral Daily Sheril Dines, MD   1 tablet at 11/04/23 8413   nutrition supplement (JUVEN) (JUVEN) powder packet 1 packet  1 packet Oral BID BM Sheril Dines, MD   1 packet at 11/04/23 1654   ondansetron  (ZOFRAN ) injection 4 mg  4 mg Intravenous Q8H PRN Sheril Dines, MD   4 mg at 10/26/23 2124   Oral care mouth rinse  15 mL Mouth Rinse PRN Sheril Dines, MD  oxyCODONE  (Oxy IR/ROXICODONE ) immediate release tablet 10 mg  10 mg Oral Q4H PRN Sheril Dines, MD       oxyCODONE  (Oxy IR/ROXICODONE ) immediate release tablet 15 mg  15 mg Oral Q4H PRN Sheril Dines, MD   15 mg at 11/05/23 1027   polyethylene glycol (MIRALAX  / GLYCOLAX ) packet 17 g  17 g Oral Daily Sheril Dines, MD   17 g at 11/04/23 2536   pregabalin  (LYRICA ) capsule 200 mg  200 mg Oral BID Williams, Jamiese M, MD   200 mg at 11/04/23 2220   protein supplement (ENSURE MAX) liquid  11 oz Oral BID Meribeth Standard, NP   11 oz at 11/04/23 2223   zinc  sulfate (50mg  elemental zinc ) capsule 220 mg  220 mg Oral Daily Sheril Dines, MD   220 mg at 11/04/23 6440     Discharge Medications: Please see discharge summary for a list of discharge medications.  Relevant Imaging Results:  Relevant Lab Results:   Additional Information SSN: [id number]  Alexandra Ice, RN

## 2023-11-05 NOTE — Plan of Care (Signed)
  Problem: Fluid Volume: Goal: Ability to maintain a balanced intake and output will improve Outcome: Progressing   Problem: Skin Integrity: Goal: Risk for impaired skin integrity will decrease Outcome: Progressing   Problem: Tissue Perfusion: Goal: Adequacy of tissue perfusion will improve Outcome: Progressing   Problem: Clinical Measurements: Goal: Ability to avoid or minimize complications of infection will improve Outcome: Progressing   Problem: Skin Integrity: Goal: Skin integrity will improve Outcome: Progressing   Problem: Education: Goal: Knowledge of General Education information will improve Description: Including pain rating scale, medication(s)/side effects and non-pharmacologic comfort measures Outcome: Progressing   Problem: Health Behavior/Discharge Planning: Goal: Ability to manage health-related needs will improve Outcome: Progressing   Problem: Clinical Measurements: Goal: Ability to maintain clinical measurements within normal limits will improve Outcome: Progressing Goal: Will remain free from infection Outcome: Progressing Goal: Diagnostic test results will improve Outcome: Progressing Goal: Respiratory complications will improve Outcome: Progressing Goal: Cardiovascular complication will be avoided Outcome: Progressing   Problem: Activity: Goal: Risk for activity intolerance will decrease Outcome: Progressing   Problem: Nutrition: Goal: Adequate nutrition will be maintained Outcome: Progressing   Problem: Elimination: Goal: Will not experience complications related to bowel motility Outcome: Progressing Goal: Will not experience complications related to urinary retention Outcome: Progressing   Problem: Pain Managment: Goal: General experience of comfort will improve and/or be controlled Outcome: Progressing   Problem: Safety: Goal: Ability to remain free from injury will improve Outcome: Progressing   Problem: Skin Integrity: Goal:  Risk for impaired skin integrity will decrease Outcome: Progressing   Problem: Coping: Goal: Ability to adjust to condition or change in health will improve Outcome: Not Progressing   Problem: Coping: Goal: Level of anxiety will decrease Outcome: Not Progressing

## 2023-11-05 NOTE — Progress Notes (Signed)
 The above named patient is recommended to go to Short Term Rehab for strengthening and gait training for balance.  It is expected that the Short Term Rehab stay will be less than 30 days.  The patient is expected to return home after Rehab.

## 2023-11-05 NOTE — Inpatient Diabetes Management (Signed)
 Inpatient Diabetes Program Recommendations  AACE/ADA: New Consensus Statement on Inpatient Glycemic Control (2015)  Target Ranges:  Prepandial:   less than 140 mg/dL      Peak postprandial:   less than 180 mg/dL (1-2 hours)      Critically ill patients:  140 - 180 mg/dL   Lab Results  Component Value Date   GLUCAP 400 (H) 11/05/2023   HGBA1C 8.9 (H) 10/27/2023    Latest Reference Range & Units 11/03/23 07:47 11/03/23 11:56 11/03/23 15:52 11/03/23 20:51 11/04/23 09:01 11/04/23 11:34 11/04/23 17:33 11/04/23 21:49 11/05/23 07:30  Glucose-Capillary 70 - 99 mg/dL 981 (H) 191 (H) 478 (H) 276 (H) 364 (H) 362 (H) 310 (H) 292 (H) 400 (H)  (H): Data is abnormally high  Home DM Meds: Lantus  100 units BID     Humalog  25 units BID      Current Orders: Semglee  40 units bid                            Novolog  20 units TID with meals                            Novolog  Moderate Correction Scale/ SSI (0-15 units) TID AC + HS  Inpatient Diabetes Program Recommendations:   Please consider: -Increase Semglee  to 50 units bid -Increase Novolog  meal coverage to 25 units tid with meals  Thank you, Marily Shows E. Miguel Christiana, RN, MSN, CDCES  Diabetes Coordinator Inpatient Glycemic Control Team Team Pager [phone] (8am-5pm) 11/05/2023 9:16 AM

## 2023-11-05 NOTE — Interval H&P Note (Signed)
 History and Physical Interval Note:  11/05/2023 1:27 PM  Martin Riddle  has presented today for surgery, with the diagnosis of PAD.  The various methods of treatment have been discussed with the patient and family. After consideration of risks, benefits and other options for treatment, the patient has consented to  Procedure(s): Lower Extremity Angiography (Left) as a surgical intervention.  The patient's history has been reviewed, patient examined, no change in status, stable for surgery.  I have reviewed the patient's chart and labs.  Questions were answered to the patient's satisfaction.     Captain Blucher

## 2023-11-05 NOTE — Progress Notes (Signed)
 Pt stated uncomfortable on the chair, encouraged by RN to try the new bed, pt accepted to be transfer to bed; feeling more comfortable on bed. Pt c/o his phones and tablet internet not working and decided to pack his bags to leave the hospital; RN educated the pt on getting care/pain control. Pt irritable and angry, meds given. Pt calm, will continue to monitor.

## 2023-11-05 NOTE — Op Note (Signed)
 [facility]  Percutaneous Study/Intervention Procedural Note   Date of Surgery: 11/05/2023  Surgeon(s):Pellegrino Kennard    Assistants:none  Pre-operative Diagnosis: PAD with ulceration left lower extremity  Post-operative diagnosis:  Same  Procedure(s) Performed:             1.  Ultrasound guidance for vascular access right femoral artery             2.  Catheter placement into left SFA from right femoral approach             3.  Aortogram and selective left lower extremity angiogram             4.  Percutaneous transluminal angioplasty of left popliteal artery with 6 mm diameter by 8 cm length Lutonix drug-coated angioplasty balloon             5.  Stent placement to the left popliteal artery with 7 mm diameter by 6 cm length life stent  6.  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 The patient has noninvasive study showing some limitation in blood flow although these were very limited studies from several months ago. The patient is brought in for angiography for further evaluation and potential treatment.  Due to the limb threatening nature of the situation, angiogram was performed for attempted limb salvage. The patient is aware that if the procedure fails, amputation would be expected.  The patient also understands that even with successful revascularization, amputation may still be required due to the severity of the situation.  Risks and benefits are discussed and informed consent is obtained.   Procedure:  The patient was identified and appropriate procedural time out was performed.  The patient was then placed supine on the table and prepped and draped in the usual  sterile fashion. Moderate conscious sedation was administered during a face to face encounter with the patient throughout the procedure with my supervision of the RN administering medicines and monitoring the patient's vital signs, pulse oximetry, telemetry and mental status throughout from the start of the procedure until the patient was taken to the recovery room. Ultrasound was used to evaluate the right common femoral artery.  It was patent .  A digital ultrasound image was acquired.  A Seldinger needle was used to access the right common femoral artery under direct ultrasound guidance and a permanent image was performed.  A 0.035 J wire was advanced without resistance and a 5Fr sheath was placed.  Pigtail catheter was placed into the aorta and an AP aortogram was performed. This demonstrated normal renal arteries and normal aorta and iliac segments without significant stenosis, although imaging was limited due to his spinal hardware. I then crossed the aortic bifurcation and advanced to the left femoral head and then into the left SFA to help opacify distally. Selective left lower extremity angiogram was then performed. This demonstrated fairly normal common femoral artery, profunda femoris artery, and superficial femoral artery.  There was a fairly focal stenosis in the mid popliteal artery in the 55 to 60% range.  There was then a normal tibial trifurcation in both the anterior tibial  and posterior tibial arteries were large and continuous into the foot without focal stenosis. It was felt that it was in the patient's best interest to proceed with intervention after these images to avoid a second procedure and a larger amount of contrast and fluoroscopy based off of the findings from the initial angiogram. The patient was systemically heparinized and a 6 [place] Ansell 70 cm sheath was then placed over the Terumo Advantage wire. I then used a Kumpe catheter and the advantage wire to navigate through the  popliteal stenosis without difficulty.  I then treated the popliteal artery with a 6 mm diameter by 8 cm length Lutonix drug-coated angioplasty balloon inflated to 10 atm for 1 minute.  Completion imaging plasty with significant wall irregularity and greater than 50% stenosis.  I elected to stent this lesion.  A 7 mm diameter by 6 cm length life stent was then selected and deployed in the left popliteal artery and postdilated with a 6 mm diameter Lutonix drug-coated balloon with excellent angiographic completion result and only about a 5 to 10% residual stenosis. I elected to terminate the procedure. The sheath was removed and StarClose closure device was deployed in the right femoral artery with excellent hemostatic result. The patient was taken to the recovery room in stable condition having tolerated the procedure well.  Findings:               Aortogram:  This demonstrated normal renal arteries and normal aorta and iliac segments without significant stenosis, although imaging was limited due to his spinal hardware.             Left Lower Extremity:  This demonstrated fairly normal common femoral artery, profunda femoris artery, and superficial femoral artery.  There was a fairly focal stenosis in the mid popliteal artery in the 55 to 60% range.  There was then a normal tibial trifurcation in both the anterior tibial and posterior tibial arteries were large and continuous into the foot without focal stenosis.   Disposition: Patient was taken to the recovery room in stable condition having tolerated the procedure well.  Complications: None  Mikki Alexander 11/05/2023 2:49 PM   This note was created with Dragon Medical transcription system. Any errors in dictation are purely unintentional.

## 2023-11-05 NOTE — Progress Notes (Signed)
 PROGRESS NOTE    Martin Riddle  YNW:[id number] DOB: Feb 18, 1971 DOA: 10/26/2023 PCP: Thomasene Flemings, NP   Assessment & Plan:   Principal Problem:   Diabetic foot infection (HCC) Active Problems:   Osteomyelitis of right foot (HCC)   Atherosclerosis of native arteries of the extremities with ulceration (HCC)   Severe sepsis (HCC)   Type 2 diabetes mellitus with peripheral neuropathy (HCC)   HTN (hypertension)   HLD (hyperlipidemia)   Depression   Obesity, Class III, BMI 40-49.9 (morbid obesity)   Hypoglycemia   Gas gangrene (HCC)  Assessment and Plan:  Acute hypoxic and hypercapnic respiratory failure: Improved.  S/p extubation on 10/29/2023. Weaned off of supplemental oxygen. Resolved   Possible OSA: will need outpatient sleep study to confirm dx    Septic shock: secondary to right foot osteomyelitis with gangrene of first metatarsophalangeal joint. See Dr. Wardell Guys note on how pt met septic shock criteria. Resolved   Osteomyelitis: w/ gangrene. 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 Completed abx course w/ unasyn, vanco until 11/02/23. Increase home dose of pregabalin  for increased right AKA stump.   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 Will go for angio today as per vasc surg. Vasc surg following and recs apprec   Hyperkalemia: lokelma x 1. Repeat potassium ordered  AKI: Resolved  Hyponatremia: labile. Will continue to monitor    DM2: poorly controlled, HbA1c 8.9. Hypoglycemia episodes have resolved. Continue on glargine, aspart, SSI w/ accuchecks. Pt was d/c from Dr. Lorelei Rogers, endo, b/c of medical nonadherence several years ago    Acute toxic metabolic encephalopathy: Improved  Depression: severity unknown. Continue on home dose of buspar     General weakness: PT/OT recs CIR  Morbid obesity: BMI 42.4. Complicates overall care & prognosis      DVT prophylaxis:  lovenox  Code Status: full  Family Communication:  Disposition Plan: possibly d/c to CIR   Level of  care: Med-Surg Consultants:  ICU ID  Procedures:   Antimicrobials:    Subjective: Pt c/o malaise.  Objective: Vitals:   11/04/23 2003 11/05/23 0448 11/05/23 0731 11/05/23 0809  BP:  (!) 146/77 (!) 134/90   Pulse:  100 96   Resp:  18 16   Temp:  98.2 F (36.8 C) 98.1 F (36.7 C)   TempSrc:      SpO2: 91% 91% 90% 93%  Weight:      Height:        Intake/Output Summary (Last 24 hours) at 11/05/2023 0829 Last data filed at 11/05/2023 0800 Gross per 24 hour  Intake 477 ml  Output 3750 ml  Net -3273 ml   Filed Weights   10/26/23 1804 10/27/23 1247  Weight: (!) 181.4 kg (!) 150.2 kg    Examination:  General exam: Appears uncomfortable. Morbid obesity  Respiratory system: diminished breath sounds b/l  Cardiovascular system: S1/S2+. No rubs or clicks   Gastrointestinal system: abd is soft, NT, obese & hypoactive bowel sounds  Central nervous system: alert & awake. Moves all extremities  Psychiatry: Judgement and insight appears at baseline. Flat mood and affect     Data Reviewed: I have personally reviewed following labs and imaging studies  CBC: Recent Labs  Lab 10/30/23 0518 10/31/23 0322 11/01/23 0359  WBC 9.0 9.5 8.9  HGB 9.0* 8.6* 8.5*  HCT 28.7* 27.7* 27.6*  MCV 94.4 93.6 92.9  PLT 282 292 329   Basic Metabolic Panel: Recent Labs  Lab 10/30/23 0518 10/31/23 0322 11/01/23 0359 11/01/23 2139  NA  134* 132* 133* 129*  K 4.5 4.7 4.7 4.8  CL 99 95* 95* 93*  CO2 28 26 28 26   GLUCOSE 250* 295* 323* 445*  BUN 19 24* 22* 29*  CREATININE 0.82 0.75 0.76 1.00  CALCIUM  8.3* 8.6* 8.7* 7.8*  MG 2.1  --   --   --   PHOS 2.2*  --   --   --    GFR: Estimated Creatinine Clearance: 137.1 mL/min (by C-G formula based on SCr of 1 mg/dL). Liver Function Tests: No results for input(s): "AST", "ALT", "ALKPHOS", "BILITOT", "PROT", "ALBUMIN" in the last 168 hours. No results for input(s): "LIPASE", "AMYLASE" in the last 168 hours. No results for input(s):  "AMMONIA" in the last 168 hours. Coagulation Profile: No results for input(s): "INR", "PROTIME" in the last 168 hours. Cardiac Enzymes: No results for input(s): "CKTOTAL", "CKMB", "CKMBINDEX", "TROPONINI" in the last 168 hours. BNP (last 3 results) No results for input(s): "PROBNP" in the last 8760 hours. HbA1C: No results for input(s): "HGBA1C" in the last 72 hours. CBG: Recent Labs  Lab 11/04/23 0901 11/04/23 1134 11/04/23 1733 11/04/23 2149 11/05/23 0730  GLUCAP 364* 362* 310* 292* 400*   Lipid Profile: Recent Labs    11/03/23 0840  TRIG 702*   Thyroid Function Tests: No results for input(s): "TSH", "T4TOTAL", "FREET4", "T3FREE", "THYROIDAB" in the last 72 hours. Anemia Panel: No results for input(s): "VITAMINB12", "FOLATE", "FERRITIN", "TIBC", "IRON", "RETICCTPCT" in the last 72 hours. Sepsis Labs: No results for input(s): "PROCALCITON", "LATICACIDVEN" in the last 168 hours.  Recent Results (from the past 240 hours)  Blood culture (single)     Status: None   Collection Time: 10/26/23  6:17 PM   Specimen: BLOOD RIGHT HAND  Result Value Ref Range Status   Specimen Description BLOOD RIGHT HAND  Final   Special Requests   Final    BOTTLES DRAWN AEROBIC AND ANAEROBIC Blood Culture results may not be optimal due to an inadequate volume of blood received in culture bottles   Culture   Final    NO GROWTH 5 DAYS Performed at [facility], [address] [place], [place] [address]    Report Status 10/31/2023 FINAL  Final  Culture, blood (single)     Status: None   Collection Time: 10/26/23  8:04 PM   Specimen: BLOOD  Result Value Ref Range Status   Specimen Description BLOOD LA  Final   Special Requests   Final    BOTTLES DRAWN AEROBIC AND ANAEROBIC Blood Culture adequate volume   Culture   Final    NO GROWTH 5 DAYS Performed at [facility], [address] [place], [place] [address]    Report Status 10/31/2023 FINAL  Final  Resp panel by  RT-PCR (RSV, Flu A&B, Covid) Anterior Nasal Swab     Status: None   Collection Time: 10/27/23  9:53 AM   Specimen: Anterior Nasal Swab  Result Value Ref Range Status   SARS Coronavirus 2 by RT PCR NEGATIVE NEGATIVE Final    Comment: (NOTE) SARS-CoV-2 target nucleic acids are NOT DETECTED.  The SARS-CoV-2 RNA is generally detectable in upper respiratory specimens during the acute phase of infection. The lowest concentration of SARS-CoV-2 viral copies this assay can detect is 138 copies/mL. A negative result does not preclude SARS-Cov-2 infection and should not be used as the sole basis for treatment or other patient management decisions. A negative result may occur with  improper specimen collection/handling, submission of specimen other than  nasopharyngeal swab, presence of viral mutation(s) within the areas targeted by this assay, and inadequate number of viral copies(<138 copies/mL). A negative result must be combined with clinical observations, patient history, and epidemiological information. The expected result is Negative.  Fact Sheet for Patients:  BloggerCourse.com  Fact Sheet for Healthcare Providers:  SeriousBroker.it  This test is no t yet approved or cleared by the [place]  FDA and  has been authorized for detection and/or diagnosis of SARS-CoV-2 by FDA under an Emergency Use Authorization (EUA). This EUA will remain  in effect (meaning this test can be used) for the duration of the COVID-19 declaration under Section 564(b)(1) of the Act, 21 U.S.C.section 360bbb-3(b)(1), unless the authorization is terminated  or revoked sooner.       Influenza A by PCR NEGATIVE NEGATIVE Final   Influenza B by PCR NEGATIVE NEGATIVE Final    Comment: (NOTE) The Xpert Xpress SARS-CoV-2/FLU/RSV plus assay is intended as an aid in the diagnosis of influenza from Nasopharyngeal swab specimens and should not be used as a sole basis  for treatment. Nasal washings and aspirates are unacceptable for Xpert Xpress SARS-CoV-2/FLU/RSV testing.  Fact Sheet for Patients: BloggerCourse.com  Fact Sheet for Healthcare Providers: SeriousBroker.it  This test is not yet approved or cleared by the [place]  FDA and has been authorized for detection and/or diagnosis of SARS-CoV-2 by FDA under an Emergency Use Authorization (EUA). This EUA will remain in effect (meaning this test can be used) for the duration of the COVID-19 declaration under Section 564(b)(1) of the Act, 21 U.S.C. section 360bbb-3(b)(1), unless the authorization is terminated or revoked.     Resp Syncytial Virus by PCR NEGATIVE NEGATIVE Final    Comment: (NOTE) Fact Sheet for Patients: BloggerCourse.com  Fact Sheet for Healthcare Providers: SeriousBroker.it  This test is not yet approved or cleared by the [place]  FDA and has been authorized for detection and/or diagnosis of SARS-CoV-2 by FDA under an Emergency Use Authorization (EUA). This EUA will remain in effect (meaning this test can be used) for the duration of the COVID-19 declaration under Section 564(b)(1) of the Act, 21 U.S.C. section 360bbb-3(b)(1), unless the authorization is terminated or revoked.  Performed at [facility], [address] [place], [place] [address]   MRSA Next Gen by PCR, Nasal     Status: None   Collection Time: 10/27/23 12:59 PM   Specimen: Nasal Mucosa; Nasal Swab  Result Value Ref Range Status   MRSA by PCR Next Gen NOT DETECTED NOT DETECTED Final    Comment: (NOTE) The GeneXpert MRSA Assay (FDA approved for NASAL specimens only), is one component of a comprehensive MRSA colonization surveillance program. It is not intended to diagnose MRSA infection nor to guide or monitor treatment for MRSA infections. Test performance is not FDA approved in  patients less than 73 years old. Performed at [facility], [address], [place], [place] [address]          Radiology Studies: No results found.      Scheduled Meds:  acetaminophen   500 mg Oral Q6H   arformoterol   15 mcg Nebulization BID   ascorbic acid   500 mg Oral BID   aspirin   81 mg Oral Daily   atorvastatin   20 mg Oral Daily   bisacodyl   5 mg Oral Daily   budesonide  (PULMICORT ) nebulizer solution  0.25 mg Nebulization BID   busPIRone   30 mg Oral TID   vitamin B-12  1,000 mcg Oral Daily  docusate sodium   100 mg Oral BID   enoxaparin  (LOVENOX ) injection  0.5 mg/kg Subcutaneous Q24H   famotidine   20 mg Oral Daily   insulin  aspart  0-15 Units Subcutaneous TID WC   insulin  aspart  0-5 Units Subcutaneous QHS   insulin  aspart  20 Units Subcutaneous TID WC   insulin  glargine-yfgn  40 Units Subcutaneous BID   iron  polysaccharides  150 mg Oral Daily   losartan   100 mg Oral Daily   multivitamin with minerals  1 tablet Oral Daily   nutrition supplement (JUVEN)  1 packet Oral BID BM   polyethylene glycol  17 g Oral Daily   pregabalin   200 mg Oral BID   Ensure Max Protein  11 oz Oral BID   zinc  sulfate (50mg  elemental zinc )  220 mg Oral Daily   Continuous Infusions:   LOS: 10 days       Alphonsus Jeans, MD [facility] Pager [phone]  If 7PM-7AM, please contact night-coverage [email or web address] Password [email or web address] 11/05/2023, 8:29 AM

## 2023-11-05 NOTE — TOC Progression Note (Signed)
 Transition of Care [facility]) - Progression Note    Patient Details  Name: Martin Riddle MRN: [id number] Date of Birth: Jun 12, 1970  [facility] CM/SW Contact  Alexandra Ice, RN Phone Number: 11/05/2023, 9:28 AM  Clinical Narrative:    Sherel Dikes submitted, [facility] requesting additional clinical information for review. Clinical was submitted via [facility] portal. Patient awaiting PASRR number.  TOC sent SNF referrals,   Expected Discharge Plan:  (TBD) Barriers to Discharge: No Barriers Identified  Expected Discharge Plan and Services   Discharge Planning Services: CM Consult Post Acute Care Choice:  (Pending PT/OT evaluations.) Living arrangements for the past 2 months: Single Family Home                                       Social Determinants of Health (SDOH) Interventions SDOH Screenings   Food Insecurity: Patient Declined (10/27/2023)  Housing: Patient Declined (10/27/2023)  Transportation Needs: Patient Declined (10/27/2023)  Utilities: Patient Declined (10/27/2023)  Alcohol Screen: Low Risk  (05/08/2017)  Social Connections: Patient Declined (10/27/2023)  Tobacco Use: High Risk (10/26/2023)    Readmission Risk Interventions     No data to display

## 2023-11-05 NOTE — Progress Notes (Incomplete)
 Pt asking for glass of drink this morning, told pt he's NPO and pt stated he was not going for angio today, doe not was to to be NPO, notified charge RN and MD.

## 2023-11-05 NOTE — Progress Notes (Signed)
 Physical Therapy Treatment Patient Details Name: Martin Riddle MRN: [id number] DOB: 23-Jun-1970 Today's Date: 11/05/2023   History of Present Illness Pt is a 53 y.o. male s/p R AKA on 10/28/23 after presenting to [facility] with severe sepsis due to R foot wound infection. RR called 5/20 with pt unresponsive requiring intubation, off vent 5/22. PMH significant for DM, atherosclerosis of native arteries of the extremities with ulceration, diabetic foot ulcer, HTN, HLD, depression, chronic venous stasis in both legs, pancreatitis, peripheral neuropathy, & morbid obesity.    PT Comments  Pt was sitting in recliner upon arrival. He is alert and oriented but does have poor safety awareness + poor overall awareness of limitations. Discussed/ educated at length expectation and need for continued PT to maximize his independence and safety. Pt is irritable but did fully participate and puts forth good effort once full motivated. He was able to stand 5 x from recliner to wall railing with CGA. Vcs for better eccentric control with stand to sit. MD arrived during session. He is agreeable to STR at DC. Acute PT will continue to follow per current POC.    If plan is discharge home, recommend the following: A lot of help with walking and/or transfers;Two people to help with bathing/dressing/bathroom;Assistance with cooking/housework;Direct supervision/assist for medications management;Direct supervision/assist for financial management;Assist for transportation;Help with stairs or ramp for entrance;Supervision due to cognitive status     Equipment Recommendations  Other (comment) (Defer to next level of care)       Precautions / Restrictions Precautions Precautions: Fall Recall of Precautions/Restrictions: Impaired Restrictions Weight Bearing Restrictions Per Provider Order: Yes RLE Weight Bearing Per Provider Order: Non weight bearing Other Position/Activity Restrictions: NWB'ing R residual limb      Mobility  Bed Mobility  General bed mobility comments: In recliner pre/post session    Transfers Overall transfer level: Needs assistance Equipment used: None (used wall railing in room to stand 5 x) Transfers: Sit to/from Stand Sit to Stand: Contact guard assist  General transfer comment: Pt was unwilling to attempt standing from recliner to BRW. He did however stand  5 x form bariatric recliner to static standing holding wall rail. tolerated standing for ~ 20-30 sec    Ambulation/Gait  General Gait Details: unable   Balance Overall balance assessment: Needs assistance Sitting-balance support: Feet supported, No upper extremity supported Sitting balance-Leahy Scale: Good     Standing balance support: Bilateral upper extremity supported, During functional activity Standing balance-Leahy Scale: Fair      Hotel manager: No apparent difficulties  Cognition Arousal: Alert Behavior During Therapy: WFL for tasks assessed/performed   PT - Cognitive impairments: Safety/Judgement, Problem solving, Sequencing, Awareness   PT - Cognition Comments: Pt is A and O x 3 but does lack overall awareness + demonstrates poor safety/judgement Following commands: Intact      Cueing Cueing Techniques: Verbal cues, Tactile cues     General Comments General comments (skin integrity, edema, etc.): Discussed at length with palliative MD (prior to session) + with hospitalist during session and care team about needs and pt's concerns. pt was educated on expectation and continues to be encouraged to perform HEP and go to STR at DC to maximize his safe functional mobility.      Pertinent Vitals/Pain Pain Assessment Pain Assessment: 0-10 Pain Score: 7  Pain Location: R residual limb/ back pain Pain Descriptors / Indicators: Throbbing, Operative site guarding Pain Intervention(s): Limited activity within patient's tolerance, Monitored during session, Premedicated before  session, Repositioned,  Other (comment) (pt requesting ms relaxer at conclusion of session)     PT Goals (current goals can now be found in the care plan section) Acute Rehab PT Goals Patient Stated Goal: Be able to eat and drink (pt curently NPO for procedure) Progress towards PT goals: Progressing toward goals    Frequency    Min 5X/week       AM-PAC PT "6 Clicks" Mobility   Outcome Measure  Help needed turning from your back to your side while in a flat bed without using bedrails?: None Help needed moving from lying on your back to sitting on the side of a flat bed without using bedrails?: A Lot Help needed moving to and from a bed to a chair (including a wheelchair)?: A Lot Help needed standing up from a chair using your arms (e.g., wheelchair or bedside chair)?: A Little Help needed to walk in hospital room?: Total Help needed climbing 3-5 steps with a railing? : Total 6 Click Score: 13    End of Session   Activity Tolerance: Patient tolerated treatment well;Patient limited by pain Patient left: in chair;with call bell/phone within reach;with family/visitor present;with chair alarm set Nurse Communication: Mobility status PT Visit Diagnosis: Other abnormalities of gait and mobility (R26.89);Pain Pain - Right/Left: Right Pain - part of body: Leg     Time: 6213-0865 PT Time Calculation (min) (ACUTE ONLY): 41 min  Charges:    $Therapeutic Exercise: 8-22 mins $Therapeutic Activity: 23-37 mins PT General Charges $$ ACUTE PT VISIT: 1 Visit                    Chester Costa PTA 11/05/23, 11:42 AM

## 2023-11-06 ENCOUNTER — Encounter: Payer: Self-pay | Admitting: Vascular Surgery

## 2023-11-06 DIAGNOSIS — L089 Local infection of the skin and subcutaneous tissue, unspecified: Secondary | ICD-10-CM | POA: Diagnosis not present

## 2023-11-06 DIAGNOSIS — I70202 Unspecified atherosclerosis of native arteries of extremities, left leg: Secondary | ICD-10-CM

## 2023-11-06 DIAGNOSIS — E11628 Type 2 diabetes mellitus with other skin complications: Secondary | ICD-10-CM | POA: Diagnosis not present

## 2023-11-06 DIAGNOSIS — E785 Hyperlipidemia, unspecified: Secondary | ICD-10-CM | POA: Diagnosis not present

## 2023-11-06 LAB — BASIC METABOLIC PANEL WITH GFR
Anion gap: 7 (ref 5–15)
BUN: 27 mg/dL — ABNORMAL HIGH (ref 6–20)
CO2: 29 mmol/L (ref 22–32)
Calcium: 9.1 mg/dL (ref 8.9–10.3)
Chloride: 96 mmol/L — ABNORMAL LOW (ref 98–111)
Creatinine, Ser: 1.11 mg/dL (ref 0.61–1.24)
GFR, Estimated: >60 mL/min (ref >60)
Glucose, Bld: 355 mg/dL — ABNORMAL HIGH (ref 70–99)
Potassium: 4.8 mmol/L (ref 3.5–5.1)
Sodium: 132 mmol/L — ABNORMAL LOW (ref 135–145)

## 2023-11-06 LAB — GLUCOSE, CAPILLARY
Glucose-Capillary: 386 mg/dL — ABNORMAL HIGH (ref 70–99)
Glucose-Capillary: 426 mg/dL — ABNORMAL HIGH (ref 70–99)
Glucose-Capillary: 301 mg/dL — ABNORMAL HIGH (ref 70–99)
Glucose-Capillary: 395 mg/dL — ABNORMAL HIGH (ref 70–99)

## 2023-11-06 LAB — CBC
HCT: 26.9 % — ABNORMAL LOW (ref 39.0–52.0)
Hemoglobin: 8.5 g/dL — ABNORMAL LOW (ref 13.0–17.0)
MCH: 29.4 pg (ref 26.0–34.0)
MCHC: 31.6 g/dL (ref 30.0–36.0)
MCV: 93.1 fL (ref 80.0–100.0)
Platelets: 295 10*3/uL (ref 150–400)
RBC: 2.89 MIL/uL — ABNORMAL LOW (ref 4.22–5.81)
RDW: 16.4 % — ABNORMAL HIGH (ref 11.5–15.5)
WBC: 7 10*3/uL (ref 4.0–10.5)
nRBC: 0 % (ref 0.0–0.2)

## 2023-11-06 LAB — TRIGLYCERIDES: Triglycerides: 572 mg/dL — ABNORMAL HIGH (ref <150)

## 2023-11-06 MED ORDER — INSULIN ASPART 100 UNIT/ML IJ SOLN
30.0000 [IU] | Freq: Three times a day (TID) | INTRAMUSCULAR | Status: DC
  Administered 2023-11-06 – 2023-11-09 (×10): 30 [IU] via SUBCUTANEOUS
  Filled 2023-11-06 (×10): qty 1

## 2023-11-06 MED ORDER — INSULIN GLARGINE-YFGN 100 UNIT/ML ~~LOC~~ SOLN
75.0000 [IU] | Freq: Two times a day (BID) | SUBCUTANEOUS | Status: DC
  Administered 2023-11-06 – 2023-11-08 (×6): 75 [IU] via SUBCUTANEOUS
  Filled 2023-11-06 (×7): qty 0.75

## 2023-11-06 NOTE — Plan of Care (Signed)
  Problem: Education: Goal: Ability to describe self-care measures that may prevent or decrease complications (Diabetes Survival Skills Education) will improve Outcome: Progressing   Problem: Coping: Goal: Ability to adjust to condition or change in health will improve Outcome: Progressing   Problem: Nutritional: Goal: Maintenance of adequate nutrition will improve Outcome: Progressing

## 2023-11-06 NOTE — Progress Notes (Signed)
 Daily Progress Note   Patient Name: Martin Riddle       Date: 11/06/2023 DOB: 1970-09-25  Age: 53 y.o. MRN#: [id number] Attending Physician: Alphonsus Jeans, MD Primary Care Physician: Thomasene Flemings, NP Admit Date: 10/26/2023  Reason for Consultation/Follow-up: Establishing goals of care  Subjective: Notes reviewed.  Labs and diagnostic results reviewed independently.  Angioplasty and stent placed by VS during angiography yesterday.  In to see patient.  He is currently sitting in bedside chair at this time.  He appears to be somewhat more rested and states he was able to sleep half the night and his hospital bed.  He discusses that his pain is improving.  Questions answered regarding phantom limb pain.  He discusses that he is eager to be able to leave the hospital and go to rehab.  He shares and we discussed his thoughts and fears regarding rehab.  We discussed options.  Questions answered as able.  He discusses a desire to be independent and do things for himself, as he does not want to rely on others to do things for him.  Discussed learning new ways of doing some things now that his leg has been amputated.  As per discussion, continue full code and full scope status.  Epic chat with attending and TOC.  If patient is unable to go to [facility], he would be interested in looking beyond the [place] area.    Length of Stay: 11  Current Medications: Scheduled Meds:   acetaminophen   500 mg Oral Q6H   arformoterol  15 mcg Nebulization BID   ascorbic acid  500 mg Oral BID   aspirin   81 mg Oral Daily   atorvastatin   20 mg Oral Daily   bisacodyl   5 mg Oral Daily   budesonide  (PULMICORT ) nebulizer solution  0.25 mg Nebulization BID   busPIRone   30 mg Oral TID   clopidogrel   75 mg Oral Daily   vitamin B-12  1,000 mcg Oral Daily   docusate sodium   100 mg Oral BID   enoxaparin  (LOVENOX ) injection  0.5 mg/kg Subcutaneous Q24H   famotidine   20 mg Oral Daily   insulin  aspart  0-15 Units Subcutaneous TID AC & HS   insulin  aspart  30 Units Subcutaneous TID WC   insulin  glargine-yfgn  75 Units Subcutaneous  BID   iron polysaccharides  150 mg Oral Daily   losartan   100 mg Oral Daily   multivitamin with minerals  1 tablet Oral Daily   nutrition supplement (JUVEN)  1 packet Oral BID BM   polyethylene glycol  17 g Oral Daily   pregabalin   200 mg Oral BID   Ensure Max Protein  11 oz Oral BID   zinc  sulfate (50mg  elemental zinc )  220 mg Oral Daily    Continuous Infusions:   PRN Meds: bisacodyl , dextrose , ipratropium-albuterol , methocarbamol , ondansetron  (ZOFRAN ) IV, mouth rinse, oxyCODONE , oxyCODONE   Physical Exam Pulmonary:     Effort: Pulmonary effort is normal.  Skin:    General: Skin is warm and dry.     Comments: Dressing to right stump clean, dry, intact  Neurological:     Mental Status: He is alert.             Vital Signs: BP 134/71 (BP Location: Right Arm)   Pulse 93   Temp 97.8 F (36.6 C) (Oral)   Resp 18   Ht 6\' 5"  (1.956 m)   Wt (!) 162.5 kg   SpO2 97%   BMI 42.48 kg/m  SpO2: SpO2: 97 % O2 Device: O2 Device: Room Air O2 Flow Rate: O2 Flow Rate (L/min): 1 L/min  Intake/output summary:  Intake/Output Summary (Last 24 hours) at 11/06/2023 1358 Last data filed at 11/06/2023 1044 Gross per 24 hour  Intake 840 ml  Output 2625 ml  Net -1785 ml   LBM: Last BM Date : 11/05/23 Baseline Weight: Weight: (!) 181.4 kg Most recent weight: Weight: (!) 162.5 kg   Patient Active Problem List   Diagnosis Date Noted   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) 11/06/2023   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   Diabetic foot infection (HCC) 10/26/2023   HTN (hypertension) 10/26/2023   HLD (hyperlipidemia) 10/26/2023   Type 2 diabetes mellitus  with peripheral neuropathy (HCC) 10/26/2023   Obesity, Class III, BMI 40-49.9 (morbid obesity) 10/26/2023   Severe sepsis (HCC) 10/26/2023   Depression 10/26/2023   Osteomyelitis of right foot (HCC) 10/26/2023   Hypoglycemia 10/26/2023   Atherosclerosis of native arteries of the extremities with ulceration (HCC) 10/20/2023   Hypertension    Failure to attend appointment 07/07/2022   Cervical radiculopathy 01/07/2022   Lumbar radiculopathy 01/07/2022   Pain syndrome, chronic 01/07/2022   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   History of lumbar spinal fusion 01/07/2022   Chronic venous stasis dermatitis of both lower extremities    Cellulitis 05/30/2021   Multiple open wounds of lower extremity, initial encounter 05/30/2021   Respiratory failure (HCC) 02/19/2018   Severe recurrent major depression with psychotic features (HCC) 01/09/2017   Congenital flat foot 01/16/2015   DDD (degenerative disc disease), lumbar 01/16/2015   Diabetes mellitus, type 2 (HCC) 01/16/2015   Hypercholesteremia 01/16/2015   Gastro-esophageal reflux disease without esophagitis 01/16/2015   Peripheral neuropathy 01/16/2015    Palliative Care Assessment & Plan    Recommendations/Plan: Continue full code and full scope treatment Patient working towards rehab placement. PMT will shadow for needs next week on my return to service.  Please reach out to weekend coverage for immediate needs.  Code Status:    Code Status Orders  (From admission, onward)           Start     Ordered   10/26/23 2115  Full code  Continuous       Question:  By:  Answer:  Consent: discussion documented in EHR   10/26/23 2115           Code Status History     Date Active Date Inactive Code Status Order ID Comments User Context   05/30/2021 2008 06/01/2021 2059 Full Code [id number]  [place], Eric J, DO ED   01/10/2017 1131 01/15/2017 1433 Full Code [id number]  Evone Hoh, MD Inpatient   12/09/2015 1550 12/11/2015 1535  Full Code [id number]  [place] Carrel, MD Inpatient   01/27/2015 2007 01/30/2015 1345 Full Code [id number]  [place] Carrel, MD Inpatient      Thank you for allowing the Palliative Medicine Team to assist in the care of this patient.   Meribeth Standard, NP  Please contact Palliative Medicine Team phone at [phone] for questions and concerns.

## 2023-11-06 NOTE — Progress Notes (Signed)
 Occupational Therapy Treatment Patient Details Name: Martin Riddle MRN: [id number] DOB: 1970-06-27 Today's Date: 11/06/2023   History of present illness Pt is a 53 y.o. 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 RR called 5/20 with pt unresponsive requiring intubation, off vent 5/22. PMH significant for DM, atherosclerosis of native arteries of the extremities with ulceration, diabetic foot ulcer, HTN, HLD, depression, chronic venous stasis in both legs, pancreatitis, peripheral neuropathy, & morbid obesity.   OT comments  Martin Riddle was seen for OT treatment on this date. Upon arrival to room pt seated on BSC, agreeable to tx. Pt requires MAX A pericare standing. MIN A don underwear, assist to pull up over rear in standing. MIN A + RW sit<>stand x3 from [facility], assist to stedy RW. MIN A + RW for BSC > chair t/f with hopping steps. Pt making good progress toward goals, will continue to follow POC. Discharge recommendation remains appropriate.        If plan is discharge home, recommend the following:  Two people to help with walking and/or transfers;Two people to help with bathing/dressing/bathroom;Assist for transportation;Help with stairs or ramp for entrance   Equipment Recommendations  Other (comment) (defer)    Recommendations for Other Services      Precautions / Restrictions Precautions Precautions: Fall Recall of Precautions/Restrictions: Impaired Restrictions Weight Bearing Restrictions Per Provider Order: Yes RLE Weight Bearing Per Provider Order: Non weight bearing       Mobility Bed Mobility               General bed mobility comments: not tested    Transfers Overall transfer level: Needs assistance Equipment used: Rolling walker (2 wheels) Transfers: Sit to/from Stand, Bed to chair/wheelchair/BSC Sit to Stand: Min assist     Step pivot transfers: Min assist     General transfer comment: assist to  stedy RW     Balance Overall balance assessment: Needs assistance Sitting-balance support: No upper extremity supported, Feet supported Sitting balance-Leahy Scale: Good     Standing balance support: Bilateral upper extremity supported, During functional activity Standing balance-Leahy Scale: Poor                             ADL either performed or assessed with clinical judgement   ADL Overall ADL's : Needs assistance/impaired                                       General ADL Comments: MIN A + RW for BSC t/f with hopping steps. MAX A pericare standing. MIN A don underwear, assist to pull up over rear in standing    Extremity/Trunk Assessment              Vision       Perception     Praxis     Communication Communication Communication: No apparent difficulties   Cognition Arousal: Alert Behavior During Therapy: WFL for tasks assessed/performed Cognition: No apparent impairments                               Following commands: Intact Following commands impaired: Follows one step commands with increased time      Cueing   Cueing Techniques: Verbal cues, Tactile cues  Exercises  Shoulder Instructions       General Comments      Pertinent Vitals/ Pain       Pain Assessment Pain Assessment: 0-10 Pain Score: 5  Pain Location: RLE phantom limb pain Pain Descriptors / Indicators: Pounding, Throbbing Pain Intervention(s): Limited activity within patient's tolerance, Repositioned  Home Living                                          Prior Functioning/Environment              Frequency  Min 3X/week        Progress Toward Goals  OT Goals(current goals can now be found in the care plan section)  Progress towards OT goals: Progressing toward goals  Acute Rehab OT Goals OT Goal Formulation: With patient Time For Goal Achievement: 11/15/23 Potential to Achieve Goals: Fair ADL  Goals Pt Will Perform Lower Body Bathing: sitting/lateral leans;sit to/from stand;with adaptive equipment;with caregiver independent in assisting;with contact guard assist Pt Will Perform Lower Body Dressing: with caregiver independent in assisting;sit to/from stand;sitting/lateral leans;with adaptive equipment;with min assist Pt Will Transfer to Toilet: with transfer board;with min assist;stand pivot transfer;bedside commode;grab bars Pt Will Perform Toileting - Clothing Manipulation and hygiene: with mod assist;sitting/lateral leans;sit to/from stand;with adaptive equipment  Plan      Co-evaluation                 AM-PAC OT "6 Clicks" Daily Activity     Outcome Measure   Help from another person eating meals?: None Help from another person taking care of personal grooming?: A Little Help from another person toileting, which includes using toliet, bedpan, or urinal?: A Lot Help from another person bathing (including washing, rinsing, drying)?: A Lot Help from another person to put on and taking off regular upper body clothing?: A Little Help from another person to put on and taking off regular lower body clothing?: A Lot 6 Click Score: 16    End of Session Equipment Utilized During Treatment: Rolling walker (2 wheels)  OT Visit Diagnosis: Pain;Other abnormalities of gait and mobility (R26.89);Unsteadiness on feet (R26.81);Muscle weakness (generalized) (M62.81) Pain - Right/Left: Right Pain - part of body: Leg   Activity Tolerance Patient tolerated treatment well   Patient Left in chair;with call bell/phone within reach;with chair alarm set   Nurse Communication          Time: 1610-9604 OT Time Calculation (min): 19 min  Charges: OT General Charges $OT Visit: 1 Visit OT Treatments $Self Care/Home Management : 8-22 mins  Gordan Latina, M.S. OTR/L  11/06/23, 11:46 AM  ascom [phone]

## 2023-11-06 NOTE — Plan of Care (Signed)
   Problem: Education: Goal: Knowledge of General Education information will improve Description Including pain rating scale, medication(s)/side effects and non-pharmacologic comfort measures Outcome: Progressing   Problem: Activity: Goal: Risk for activity intolerance will decrease Outcome: Progressing   Problem: Safety: Goal: Ability to remain free from injury will improve Outcome: Progressing

## 2023-11-06 NOTE — Inpatient Diabetes Management (Addendum)
 Inpatient Diabetes Program Recommendations  AACE/ADA: New Consensus Statement on Inpatient Glycemic Control (2015)  Target Ranges:  Prepandial:   less than 140 mg/dL      Peak postprandial:   less than 180 mg/dL (1-2 hours)      Critically ill patients:  140 - 180 mg/dL   Lab Results  Component Value Date   GLUCAP 386 (H) 11/06/2023   HGBA1C 8.9 (H) 10/27/2023    Latest Reference Range & Units 11/05/23 07:30 11/05/23 11:35 11/05/23 13:45 11/05/23 17:15 11/05/23 21:52 11/06/23 07:38  Glucose-Capillary 70 - 99 mg/dL 540 (H) 981 (H) 191 (H) 307 (H) 348 (H) 386 (H)  (H): Data is abnormally high  Home DM Meds: Lantus  100 units BID     Humalog  25 units BID      Current Orders: Semglee  500 units bid                            Novolog  25 units TID with meals                            Novolog  Moderate Correction Scale/ SSI (0-15 units) TID AC + HS  Inpatient Diabetes Program Recommendations:   Please consider: -Increase Semglee  to 75 units bid -Increase Novolog  meal coverage to 30 units tid with meals -Change Novolog  correction to 0-15 q 4 hrs. While elevated.  Thank you, Shriley Joffe E. Tashona Calk, RN, MSN, CDCES  Diabetes Coordinator Inpatient Glycemic Control Team Team Pager [phone] (8am-5pm) 11/06/2023 8:50 AM

## 2023-11-06 NOTE — TOC Progression Note (Signed)
 Transition of Care [facility]) - Progression Note    Patient Details  Name: Martin Riddle MRN: [id number] Date of Birth: 12/13/1970  Transition of [facility]) CM/SW Contact  Alexandra Ice, RN Phone Number: 11/06/2023, 11:05 AM  Clinical Narrative:    Met with patient at bedside, discussed bed offers. Currently patient has one bed offer, [facility]. He was hoping to go to where is wife works, [employer]. TOC will review to see if they were able to accept patient, and will follow up with patient with update.   Expected Discharge Plan:  (TBD) Barriers to Discharge: No Barriers Identified  Expected Discharge Plan and Services   Discharge Planning Services: CM Consult Post Acute Care Choice:  (Pending PT/OT evaluations.) Living arrangements for the past 2 months: Single Family Home                                       Social Determinants of Health (SDOH) Interventions SDOH Screenings   Food Insecurity: Patient Declined (10/27/2023)  Housing: Patient Declined (10/27/2023)  Transportation Needs: Patient Declined (10/27/2023)  Utilities: Patient Declined (10/27/2023)  Alcohol Screen: Low Risk  (05/08/2017)  Social Connections: Patient Declined (10/27/2023)  Tobacco Use: High Risk (10/26/2023)    Readmission Risk Interventions     No data to display

## 2023-11-06 NOTE — Progress Notes (Addendum)
 Progress Note    11/06/2023 7:12 AM 1 Day Post-Op  Subjective:  Shreyan Hinz is a 53 yo male who is post op Day #1 from   Procedure(s) Performed:             1.  Ultrasound guidance for vascular access right femoral artery             2.  Catheter placement into left SFA from right femoral approach             3.  Aortogram and selective left lower extremity angiogram             4.  Percutaneous transluminal angioplasty of left popliteal artery with 6 mm diameter by 8 cm length Lutonix drug-coated angioplasty balloon             5.  Stent placement to the left popliteal artery with 7 mm diameter by 6 cm length life stent             6.  StarClose closure device right femoral artery  Post Op Day # 9 from Right AKA   Patient is resting comfortably sitting up in bedside chair this morning breakfast.  Patient endorses intermittent thumping pain depending upon how hard this morning with physical therapy.  Left lower extremity is warm to touch this morning with good pulses.  No other complaints overnight vitals are remained stable.   Vitals:   11/05/23 1948 11/06/23 0625  BP: (!) 142/73 (!) 142/63  Pulse: 89 96  Resp: 17 19  Temp: 98 F (36.7 C) 98.1 F (36.7 C)  SpO2: 99% 90%   Physical Exam: Cardiac:  RRR, Normal S1, S2. No  rubs, clicks or gallup's. No murmurs. Lungs: Ventilated on pressure support.  Lungs were rhonchorous throughout. Incisions: Right lower extremity AKA.  Clean dry and intact. Extremities: Right AKA dressing clean dry and intact.  No complications.  Left lower extremity with cellulitis +2 swelling.  Unable to palpate pulses.  Foot is warm to the touch. Abdomen: Positive bowel sounds throughout, soft, tender and distended. Neurologic: AAOX3, answers questions and follows commands appropriately.   CBC    Component Value Date/Time   WBC 6.8 11/05/2023 1023   RBC 4.67 11/05/2023 1023   HGB 13.4 11/05/2023 1023   HGB 13.4 03/15/2014 0339   HCT 43.3  11/05/2023 1023   HCT 40.4 03/15/2014 0339   PLT 252 11/05/2023 1023   PLT 173 03/15/2014 0339   MCV 92.7 11/05/2023 1023   MCV 94 03/15/2014 0339   MCH 28.7 11/05/2023 1023   MCHC 30.9 11/05/2023 1023   RDW 16.9 (H) 11/05/2023 1023   RDW 13.1 03/15/2014 0339   LYMPHSABS 1.4 10/26/2023 1817   LYMPHSABS 2.2 03/12/2014 0600   MONOABS 0.9 10/26/2023 1817   MONOABS 1.3 (H) 03/12/2014 0600   EOSABS 0.1 10/26/2023 1817   EOSABS 0.0 03/12/2014 0600   BASOSABS 0.0 10/26/2023 1817   BASOSABS 0.1 03/12/2014 0600    BMET    Component Value Date/Time   NA 134 (L) 11/05/2023 1023   NA 135 (L) 03/15/2014 0339   K 4.5 11/05/2023 2124   K 3.1 (L) 03/15/2014 0339   CL 99 11/05/2023 1023   CL 104 03/15/2014 0339   CO2 29 11/05/2023 1023   CO2 24 03/15/2014 0339   GLUCOSE 433 (H) 11/05/2023 1023   GLUCOSE 127 (H) 03/15/2014 0339   BUN 40 (H) 11/05/2023 1023   BUN 9 03/15/2014 0339   CREATININE  1.12 11/05/2023 1023   CREATININE 0.64 03/15/2014 0339   CALCIUM  9.2 11/05/2023 1023   CALCIUM  8.4 (L) 03/15/2014 0339   GFRNONAA >60 11/05/2023 1023   GFRNONAA >60 03/15/2014 0339   GFRAA >60 02/28/2018 0429   GFRAA >60 03/15/2014 0339    INR    Component Value Date/Time   INR 1.2 10/26/2023 2136     Intake/Output Summary (Last 24 hours) at 11/06/2023 2841 Last data filed at 11/06/2023 0210 Gross per 24 hour  Intake 480 ml  Output 3950 ml  Net -3470 ml     Assessment/Plan:  53 y.o. male is s/p left lower extremity angiogram with stent placement and right lower extremity AKA.  1 Day Post-Op   Plan Okay per vascular surgery for patient to be discharged to rehab today.  Patient will need to be on ASA 81 mg daily, Plavix 75 mg daily and Lipitor 20 mg daily.  Patient will follow-up in the vascular clinic as scheduled in 3 weeks to get staples removed from right AKA.  Patient was counseled on his hyperlipidemia today.  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  Patient verbalizes understanding.  DVT prophylaxis: ASA 81 mg daily and Plavix 75 mg daily.   Annamaria Barrette [facility] 11/06/2023 7:12 AM

## 2023-11-06 NOTE — TOC Progression Note (Signed)
 Transition of Care [facility]) - Progression Note    Patient Details  Name: Martin Riddle MRN: [id number] Date of Birth: Dec 29, 1970  Transition of Care [facility]) CM/SW Contact  Alexandra Ice, RN Phone Number: 11/06/2023, 3:03 PM  Clinical Narrative:     Patient has a few more bed offers, provided patient SNF list printed off medicare.gov website. Will follow up with patient this weekend if time permits.  Expected Discharge Plan:  (TBD) Barriers to Discharge: No Barriers Identified  Expected Discharge Plan and Services   Discharge Planning Services: CM Consult Post Acute Care Choice:  (Pending PT/OT evaluations.) Living arrangements for the past 2 months: Single Family Home                                       Social Determinants of Health (SDOH) Interventions SDOH Screenings   Food Insecurity: Patient Declined (10/27/2023)  Housing: Patient Declined (10/27/2023)  Transportation Needs: Patient Declined (10/27/2023)  Utilities: Patient Declined (10/27/2023)  Alcohol Screen: Low Risk  (05/08/2017)  Social Connections: Patient Declined (10/27/2023)  Tobacco Use: High Risk (10/26/2023)    Readmission Risk Interventions     No data to display

## 2023-11-06 NOTE — Progress Notes (Signed)
 PROGRESS NOTE    Martin Riddle  EAV:[id number] DOB: Apr 29, 1971 DOA: 10/26/2023 PCP: Thomasene Flemings, NP   Assessment & Plan:   Principal Problem:   Diabetic foot infection (HCC) Active Problems:   Osteomyelitis of right foot (HCC)   Atherosclerosis of native arteries of the extremities with ulceration (HCC)   Severe sepsis (HCC)   Type 2 diabetes mellitus with peripheral neuropathy (HCC)   HTN (hypertension)   HLD (hyperlipidemia)   Depression   Obesity, Class III, BMI 40-49.9 (morbid obesity)   Hypoglycemia   Gas gangrene (HCC)  Assessment and Plan:  Acute hypoxic and hypercapnic respiratory failure: Improved.  S/p extubation on 10/29/2023. Weaned off of supplemental oxygen. Resolved   Possible OSA: will need outpatient sleep study to confirm dx    Septic shock: secondary to right foot osteomyelitis with gangrene of first metatarsophalangeal joint. See Dr. Wardell Guys note on how pt met septic shock criteria. Resolved   Osteomyelitis: w/ gangrene. 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 Completed abx course w/ unasyn, vanco until 11/02/23. Oxy prn for pain. Continue on increased dose of pregabalin    PAD: s/p right AKA on 10/28/23. S/p 1 stent placed in left popliteal artery during angiogram 11/05/23 as per vasc surg. Will need f/u outpatient w/ vasc surg in 3 weeks. Vasc surg recs apprec    Hyperkalemia: resolved   AKI: Resolved  Hyponatremia: labile. Will continue to monitor    DM2: poorly controlled, HbA1c 8.9. Hypoglycemia episodes have resolved. Continue on glargine, aspart, SSI w/ accuchecks. Pt was d/c from Dr. Lorelei Rogers, endo, b/c of medical nonadherence several years ago    Acute toxic metabolic encephalopathy: resolved   Depression: severity unknown. Continue on home dose of buspar     General weakness: PT/OT recs CIR  Morbid obesity: BMI 42.4. Complicates overall care & prognosis      DVT prophylaxis:  lovenox  Code Status: full  Family Communication:   Disposition Plan: possibly d/c to CIR   Level of care: Med-Surg Consultants:  ICU ID  Procedures:   Antimicrobials:    Subjective: Pt c/o fatigue   Objective: Vitals:   11/05/23 1925 11/05/23 1948 11/06/23 0625 11/06/23 0736  BP:  (!) 142/73 (!) 142/63 134/71  Pulse:  89 96 93  Resp: 20 17 19 18   Temp:  98 F (36.7 C) 98.1 F (36.7 C) 97.8 F (36.6 C)  TempSrc:  Oral  Oral  SpO2:  99% 90% 97%  Weight:      Height:        Intake/Output Summary (Last 24 hours) at 11/06/2023 0828 Last data filed at 11/06/2023 0210 Gross per 24 hour  Intake 480 ml  Output 3250 ml  Net -2770 ml   Filed Weights   10/26/23 1804 10/27/23 1247 11/05/23 1300  Weight: (!) 181.4 kg (!) 150.2 kg (!) 162.5 kg    Examination:  General exam: appears uncomfortable. Morbidly obese Respiratory system: decreased breath sounds b/l  Cardiovascular system: S1 & S2+. No rubs or clicks   Gastrointestinal system: abd is soft, NT, obese & hypoactive bowel sounds  Central nervous system: alert & awake. Moves all extremities  Psychiatry: judgement and insight appears at baseline. Frustrated mood and affect     Data Reviewed: I have personally reviewed following labs and imaging studies  CBC: Recent Labs  Lab 10/31/23 0322 11/01/23 0359 11/05/23 1023  WBC 9.5 8.9 6.8  HGB 8.6* 8.5* 13.4  HCT 27.7* 27.6* 43.3  MCV 93.6 92.9 92.7  PLT  292 329 252   Basic Metabolic Panel: Recent Labs  Lab 10/31/23 0322 11/01/23 0359 11/01/23 2139 11/05/23 1023 11/05/23 2124  NA 132* 133* 129* 134*  --   K 4.7 4.7 4.8 5.6* 4.5  CL 95* 95* 93* 99  --   CO2 26 28 26 29   --   GLUCOSE 295* 323* 445* 433*  --   BUN 24* 22* 29* 40*  --   CREATININE 0.75 0.76 1.00 1.12  --   CALCIUM  8.6* 8.7* 7.8* 9.2  --    GFR: Estimated Creatinine Clearance: 127.8 mL/min (by C-G formula based on SCr of 1.12 mg/dL). Liver Function Tests: No results for input(s): "AST", "ALT", "ALKPHOS", "BILITOT", "PROT", "ALBUMIN" in  the last 168 hours. No results for input(s): "LIPASE", "AMYLASE" in the last 168 hours. No results for input(s): "AMMONIA" in the last 168 hours. Coagulation Profile: No results for input(s): "INR", "PROTIME" in the last 168 hours. Cardiac Enzymes: No results for input(s): "CKTOTAL", "CKMB", "CKMBINDEX", "TROPONINI" in the last 168 hours. BNP (last 3 results) No results for input(s): "PROBNP" in the last 8760 hours. HbA1C: No results for input(s): "HGBA1C" in the last 72 hours. CBG: Recent Labs  Lab 11/05/23 1135 11/05/23 1345 11/05/23 1715 11/05/23 2152 11/06/23 0738  GLUCAP 427* 366* 307* 348* 386*   Lipid Profile: Recent Labs    11/03/23 0840 11/06/23 0504  TRIG 702* 572*   Thyroid Function Tests: No results for input(s): "TSH", "T4TOTAL", "FREET4", "T3FREE", "THYROIDAB" in the last 72 hours. Anemia Panel: No results for input(s): "VITAMINB12", "FOLATE", "FERRITIN", "TIBC", "IRON", "RETICCTPCT" in the last 72 hours. Sepsis Labs: No results for input(s): "PROCALCITON", "LATICACIDVEN" in the last 168 hours.  Recent Results (from the past 240 hours)  Resp panel by RT-PCR (RSV, Flu A&B, Covid) Anterior Nasal Swab     Status: None   Collection Time: 10/27/23  9:53 AM   Specimen: Anterior Nasal Swab  Result Value Ref Range Status   SARS Coronavirus 2 by RT PCR NEGATIVE NEGATIVE Final    Comment: (NOTE) SARS-CoV-2 target nucleic acids are NOT DETECTED.  The SARS-CoV-2 RNA is generally detectable in upper respiratory specimens during the acute phase of infection. The lowest concentration of SARS-CoV-2 viral copies this assay can detect is 138 copies/mL. A negative result does not preclude SARS-Cov-2 infection and should not be used as the sole basis for treatment or other patient management decisions. A negative result may occur with  improper specimen collection/handling, submission of specimen other than nasopharyngeal swab, presence of viral mutation(s) within  the areas targeted by this assay, and inadequate number of viral copies(<138 copies/mL). A negative result must be combined with clinical observations, patient history, and epidemiological information. The expected result is Negative.  Fact Sheet for Patients:  BloggerCourse.com  Fact Sheet for Healthcare Providers:  SeriousBroker.it  This test is no t yet approved or cleared by the [place]  FDA and  has been authorized for detection and/or diagnosis of SARS-CoV-2 by FDA under an Emergency Use Authorization (EUA). This EUA will remain  in effect (meaning this test can be used) for the duration of the COVID-19 declaration under Section 564(b)(1) of the Act, 21 U.S.C.section 360bbb-3(b)(1), unless the authorization is terminated  or revoked sooner.       Influenza A by PCR NEGATIVE NEGATIVE Final   Influenza B by PCR NEGATIVE NEGATIVE Final    Comment: (NOTE) The Xpert Xpress SARS-CoV-2/FLU/RSV plus assay is intended as an aid in the diagnosis of influenza from  Nasopharyngeal swab specimens and should not be used as a sole basis for treatment. Nasal washings and aspirates are unacceptable for Xpert Xpress SARS-CoV-2/FLU/RSV testing.  Fact Sheet for Patients: BloggerCourse.com  Fact Sheet for Healthcare Providers: SeriousBroker.it  This test is not yet approved or cleared by the [place]  FDA and has been authorized for detection and/or diagnosis of SARS-CoV-2 by FDA under an Emergency Use Authorization (EUA). This EUA will remain in effect (meaning this test can be used) for the duration of the COVID-19 declaration under Section 564(b)(1) of the Act, 21 U.S.C. section 360bbb-3(b)(1), unless the authorization is terminated or revoked.     Resp Syncytial Virus by PCR NEGATIVE NEGATIVE Final    Comment: (NOTE) Fact Sheet for  Patients: BloggerCourse.com  Fact Sheet for Healthcare Providers: SeriousBroker.it  This test is not yet approved or cleared by the [place]  FDA and has been authorized for detection and/or diagnosis of SARS-CoV-2 by FDA under an Emergency Use Authorization (EUA). This EUA will remain in effect (meaning this test can be used) for the duration of the COVID-19 declaration under Section 564(b)(1) of the Act, 21 U.S.C. section 360bbb-3(b)(1), unless the authorization is terminated or revoked.  Performed at [facility], [address] [place], [place] [address]   MRSA Next Gen by PCR, Nasal     Status: None   Collection Time: 10/27/23 12:59 PM   Specimen: Nasal Mucosa; Nasal Swab  Result Value Ref Range Status   MRSA by PCR Next Gen NOT DETECTED NOT DETECTED Final    Comment: (NOTE) The GeneXpert MRSA Assay (FDA approved for NASAL specimens only), is one component of a comprehensive MRSA colonization surveillance program. It is not intended to diagnose MRSA infection nor to guide or monitor treatment for MRSA infections. Test performance is not FDA approved in patients less than 92 years old. Performed at [facility], [address], [place], [place] [address]          Radiology Studies: PERIPHERAL VASCULAR CATHETERIZATION Result Date: 11/05/2023 See surgical note for result.       Scheduled Meds:  acetaminophen   500 mg Oral Q6H   arformoterol   15 mcg Nebulization BID   ascorbic acid   500 mg Oral BID   aspirin   81 mg Oral Daily   atorvastatin   20 mg Oral Daily   bisacodyl   5 mg Oral Daily   budesonide  (PULMICORT ) nebulizer solution  0.25 mg Nebulization BID   busPIRone   30 mg Oral TID   clopidogrel   75 mg Oral Daily   vitamin B-12  1,000 mcg Oral Daily   docusate sodium   100 mg Oral BID   enoxaparin  (LOVENOX ) injection  0.5 mg/kg Subcutaneous Q24H   famotidine   20 mg Oral Daily    insulin  aspart  0-15 Units Subcutaneous TID AC & HS   insulin  aspart  25 Units Subcutaneous TID WC   insulin  glargine-yfgn  50 Units Subcutaneous BID   iron  polysaccharides  150 mg Oral Daily   losartan   100 mg Oral Daily   multivitamin with minerals  1 tablet Oral Daily   nutrition supplement (JUVEN)  1 packet Oral BID BM   polyethylene glycol  17 g Oral Daily   pregabalin   200 mg Oral BID   Ensure Max Protein  11 oz Oral BID   zinc  sulfate (50mg  elemental zinc )  220 mg Oral Daily   Continuous Infusions:   LOS: 11 days       Alphonsus Jeans, MD [facility] Pager [phone]  If 7PM-7AM, please contact night-coverage [email or web address] Password [email or web address] 11/06/2023, 8:28 AM

## 2023-11-06 NOTE — Progress Notes (Signed)
 Physical Therapy Treatment Patient Details Name: Martin Riddle MRN: [id number] DOB: 02/14/1971 Today's Date: 11/06/2023   History of Present Illness Pt is a 53 y.o. 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 RR called 5/20 with pt unresponsive requiring intubation, off vent 5/22. PMH significant for DM, atherosclerosis of native arteries of the extremities with ulceration, diabetic foot ulcer, HTN, HLD, depression, chronic venous stasis in both legs, pancreatitis, peripheral neuropathy, & morbid obesity.    PT Comments  Pt was sitting in recliner upon arrival.  He is A and O and much more motivated to participate today. He was able to safely stand from recliner to bariatric RW 5 x. Each time in standing, pt hopped/ cleared LLE. Pt does fatigue extremely quickly and needs prolonged sated rest between standing trials. Overall, pt is progressing well towards rehab goals. DC recs remain appropriate to maximize his independence and safety with all ADLs     If plan is discharge home, recommend the following: A lot of help with walking and/or transfers;Two people to help with bathing/dressing/bathroom;Assistance with cooking/housework;Direct supervision/assist for medications management;Direct supervision/assist for financial management;Assist for transportation;Help with stairs or ramp for entrance;Supervision due to cognitive status   Can travel by private vehicle     No  Equipment Recommendations  Other (comment)       Precautions / Restrictions Precautions Precautions: Fall Recall of Precautions/Restrictions: Impaired Restrictions Weight Bearing Restrictions Per Provider Order: Yes RLE Weight Bearing Per Provider Order: Non weight bearing     Mobility  Bed Mobility  General bed mobility comments: In recliner pre/post session    Transfers Overall transfer level: Needs assistance Equipment used: Rolling walker (2  wheels) Transfers: Sit to/from Stand Sit to Stand: Min assist  General transfer comment: Author has to hold RW from moving however no physical lifting assistance on pt. Vcs for fwd wt shift and placement.    Ambulation/Gait    General Gait Details: Pt was able to "hop" on LLE 5 x 5. Pt elected not to try to ambulate/hop fwd due to fear but easily could if willing.    Balance Overall balance assessment: Needs assistance Sitting-balance support: No upper extremity supported, Feet supported Sitting balance-Leahy Scale: Good     Standing balance support: Bilateral upper extremity supported, During functional activity, Reliant on assistive device for balance Standing balance-Leahy Scale: Poor Standing balance comment: pt is at high risk of falls       Communication Communication Communication: No apparent difficulties  Cognition Arousal: Alert Behavior During Therapy: WFL for tasks assessed/performed   PT - Cognitive impairments: Safety/Judgement, Problem solving, Sequencing, Awareness    PT - Cognition Comments: Pt was much more willing/actually even motivated to participate today. Still has some cognition concerns with poor recall of previous education Following commands: Intact Following commands impaired: Follows one step commands with increased time    Cueing Cueing Techniques: Verbal cues, Tactile cues         Pertinent Vitals/Pain Pain Assessment Pain Assessment: 0-10 Pain Score: 3  Pain Location: RLE phantom limb pain Pain Descriptors / Indicators: Throbbing Pain Intervention(s): Limited activity within patient's tolerance, Monitored during session, Premedicated before session, Repositioned     PT Goals (current goals can now be found in the care plan section) Acute Rehab PT Goals Patient Stated Goal: Walk normal again Progress towards PT goals: Progressing toward goals     Co-evaluation     PT goals addressed  during session: Mobility/safety with  mobility;Balance;Proper use of DME;Strengthening/ROM        AM-PAC PT "6 Clicks" Mobility   Outcome Measure  Help needed turning from your back to your side while in a flat bed without using bedrails?: None Help needed moving from lying on your back to sitting on the side of a flat bed without using bedrails?: A Little Help needed moving to and from a bed to a chair (including a wheelchair)?: A Little Help needed standing up from a chair using your arms (e.g., wheelchair or bedside chair)?: A Lot Help needed to walk in hospital room?: A Lot Help needed climbing 3-5 steps with a railing? : Total 6 Click Score: 15    End of Session   Activity Tolerance: Patient tolerated treatment well;Patient limited by pain Patient left: in chair;with call bell/phone within reach;with family/visitor present;with chair alarm set Nurse Communication: Mobility status PT Visit Diagnosis: Other abnormalities of gait and mobility (R26.89);Pain Pain - Right/Left: Right Pain - part of body: Leg     Time: 1440-1458 PT Time Calculation (min) (ACUTE ONLY): 18 min  Charges:    $Therapeutic Activity: 8-22 mins PT General Charges $$ ACUTE PT VISIT: 1 Visit                     Chester Costa PTA 11/06/23, 3:42 PM

## 2023-11-07 DIAGNOSIS — L089 Local infection of the skin and subcutaneous tissue, unspecified: Secondary | ICD-10-CM | POA: Diagnosis not present

## 2023-11-07 DIAGNOSIS — E11628 Type 2 diabetes mellitus with other skin complications: Secondary | ICD-10-CM | POA: Diagnosis not present

## 2023-11-07 LAB — BASIC METABOLIC PANEL WITH GFR
Anion gap: 10 (ref 5–15)
BUN: 34 mg/dL — ABNORMAL HIGH (ref 6–20)
CO2: 27 mmol/L (ref 22–32)
Calcium: 9 mg/dL (ref 8.9–10.3)
Chloride: 96 mmol/L — ABNORMAL LOW (ref 98–111)
Creatinine, Ser: 1 mg/dL (ref 0.61–1.24)
GFR, Estimated: >60 mL/min (ref >60)
Glucose, Bld: 383 mg/dL — ABNORMAL HIGH (ref 70–99)
Potassium: 4.7 mmol/L (ref 3.5–5.1)
Sodium: 133 mmol/L — ABNORMAL LOW (ref 135–145)

## 2023-11-07 LAB — CBC
HCT: 28.9 % — ABNORMAL LOW (ref 39.0–52.0)
Hemoglobin: 9 g/dL — ABNORMAL LOW (ref 13.0–17.0)
MCH: 29 pg (ref 26.0–34.0)
MCHC: 31.1 g/dL (ref 30.0–36.0)
MCV: 93.2 fL (ref 80.0–100.0)
Platelets: 288 10*3/uL (ref 150–400)
RBC: 3.1 MIL/uL — ABNORMAL LOW (ref 4.22–5.81)
RDW: 16.9 % — ABNORMAL HIGH (ref 11.5–15.5)
WBC: 6.7 10*3/uL (ref 4.0–10.5)
nRBC: 0.4 % — ABNORMAL HIGH (ref 0.0–0.2)

## 2023-11-07 LAB — GLUCOSE, CAPILLARY
Glucose-Capillary: 406 mg/dL — ABNORMAL HIGH (ref 70–99)
Glucose-Capillary: 446 mg/dL — ABNORMAL HIGH (ref 70–99)
Glucose-Capillary: 448 mg/dL — ABNORMAL HIGH (ref 70–99)
Glucose-Capillary: 406 mg/dL — ABNORMAL HIGH (ref 70–99)

## 2023-11-07 MED ORDER — ASPIRIN 81 MG PO CHEW: 81.0000 mg | CHEWABLE_TABLET | Freq: Every day | ORAL | 0 refills | Status: DC

## 2023-11-07 MED ORDER — CYANOCOBALAMIN 1000 MCG PO TABS: 1000.0000 ug | ORAL_TABLET | Freq: Every day | ORAL | 0 refills | Status: DC

## 2023-11-07 MED ORDER — TRAZODONE HCL 50 MG PO TABS
25.0000 mg | ORAL_TABLET | Freq: Once | ORAL | Status: AC
  Administered 2023-11-07: 25 mg via ORAL
  Filled 2023-11-07: qty 1

## 2023-11-07 MED ORDER — POLYSACCHARIDE IRON COMPLEX 150 MG PO CAPS: 150.0000 mg | ORAL_CAPSULE | Freq: Every day | ORAL | 0 refills | Status: DC

## 2023-11-07 MED ORDER — OXYCODONE HCL 10 MG PO TABS: 10.0000 mg | ORAL_TABLET | ORAL | 0 refills | Status: DC | PRN

## 2023-11-07 MED ORDER — PREGABALIN 200 MG PO CAPS: 200.0000 mg | ORAL_CAPSULE | Freq: Two times a day (BID) | ORAL | 0 refills | Status: DC

## 2023-11-07 MED ORDER — INSULIN ASPART 100 UNIT/ML IJ SOLN: 30.0000 [IU] | Freq: Three times a day (TID) | INTRAMUSCULAR | Status: AC

## 2023-11-07 MED ORDER — INSULIN GLARGINE-YFGN 100 UNIT/ML ~~LOC~~ SOLN: 75.0000 [IU] | Freq: Two times a day (BID) | SUBCUTANEOUS | Status: DC

## 2023-11-07 MED ORDER — INSULIN ASPART 100 UNIT/ML IJ SOLN: 0.0000 [IU] | Freq: Three times a day (TID) | INTRAMUSCULAR | Status: DC

## 2023-11-07 MED ORDER — CLOPIDOGREL BISULFATE 75 MG PO TABS: 75.0000 mg | ORAL_TABLET | Freq: Every day | ORAL | 0 refills | Status: DC

## 2023-11-07 NOTE — Progress Notes (Addendum)
 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  S/p extubation on 10/29/2023. Weaned off of supplemental oxygen. Resolved   Possible OSA: will need outpatient sleep study to confirm dx   Septic shock: secondary to right foot osteomyelitis with gangrene of first metatarsophalangeal joint. See Dr. Wardell Guys note on how pt met septic shock criteria. Resolved   Osteomyelitis: w/ gangrene. 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 Completed abx course w/ unasyn , vanco until 11/02/23. Continue on pregabalin , oxy for pain   PAD: s/p right AKA on 10/28/23. S/p 1 stent placed in left popliteal artery during angiogram 11/05/23 as per vasc surg. Will need f/u outpatient w/ vasc surg in 3 weeks. Vasc surg recs apprec    Hyperkalemia: resolved   AKI: resolved  Hyponatremia: labile. Will continue to monitor    DM2: poorly controlled, HbA1c 8.9. Hypoglycemia episodes have resolved. Continue on glargine, aspart, SSI w/ accuchecks. Pt was d/c from Dr. Lorelei Rogers, endo, b/c of medical nonadherence several years ago    Acute toxic metabolic encephalopathy: resolved   Depression: severity unknown. Continue on home dose of buspar     General weakness: PT/OT recs CIR  Morbid obesity: BMI 42.4. Complicates overall care & prognosis      DVT prophylaxis:  lovenox  Code Status: full   Family Communication:  Disposition Plan: possibly d/c to CIR vs SNF  Level of care: Med-Surg  Status is: Inpatient Remains inpatient appropriate because: medically stable. Waiting on CIR vs SNF placement     Consultants:  ICU ID  Procedures:   Antimicrobials:    Subjective: Pt c/o right stump pain   Objective: Vitals:   11/06/23 2016 11/06/23 2028 11/07/23 0720 11/07/23 0800  BP: (!) 141/63  135/67   Pulse: 93  93   Resp: 19  17   Temp: 98 F (36.7 C)  98.3 F (36.8 C)   TempSrc:      SpO2: 95% 94% 91% 91%  Weight:      Height:        Intake/Output Summary (Last 24 hours) at 11/07/2023 0832 Last data filed at 11/07/2023 0617 Gross per 24 hour  Intake 360 ml  Output 2100 ml  Net -1740 ml   Filed Weights   10/26/23 1804 10/27/23 1247 11/05/23 1300  Weight: (!) 181.4 kg (!) 150.2 kg (!) 162.5 kg    Examination:  General exam: appears comfortable. Morbidly obese Respiratory system: diminished breath sounds b/l  Cardiovascular system: S1/S2+. No rubs or clicks  Gastrointestinal system: abd is soft, NT, obese & hypoactive bowel sounds  Central nervous system: alert & oriented.  Psychiatry: judgement and insight appears at baseline. Flat mood and affect    Data Reviewed: I have personally reviewed following labs and imaging studies  CBC: Recent Labs  Lab 11/01/23 0359 11/05/23 1023 11/06/23 0504 11/07/23 0515  WBC 8.9  6.8 7.0 6.7  HGB 8.5* 13.4 8.5* 9.0*  HCT 27.6* 43.3 26.9* 28.9*  MCV 92.9 92.7 93.1 93.2  PLT 329 252 295 288   Basic Metabolic Panel: Recent Labs  Lab 11/01/23 0359 11/01/23 2139 11/05/23 1023 11/05/23 2124 11/06/23 0504 11/07/23 0515  NA 133* 129* 134*  --  132* 133*  K 4.7 4.8 5.6* 4.5 4.8 4.7  CL 95* 93* 99  --  96* 96*  CO2 28 26 29   --  29 27  GLUCOSE 323* 445* 433*  --  355* 383*  BUN 22* 29* 40*  --  27* 34*  CREATININE 0.76 1.00 1.12  --  1.11 1.00  CALCIUM  8.7* 7.8* 9.2  --  9.1 9.0   GFR: Estimated  Creatinine Clearance: 143.2 mL/min (by C-G formula based on SCr of 1 mg/dL). Liver Function Tests: No results for input(s): "AST", "ALT", "ALKPHOS", "BILITOT", "PROT", "ALBUMIN " in the last 168 hours. No results for input(s): "LIPASE", "AMYLASE" in the last 168 hours. No results for input(s): "AMMONIA" in the last 168 hours. Coagulation Profile: No results for input(s): "INR", "PROTIME" in the last 168 hours. Cardiac Enzymes: No results for input(s): "CKTOTAL", "CKMB", "CKMBINDEX", "TROPONINI" in the last 168 hours. BNP (last 3 results) No results for input(s): "PROBNP" in the last 8760 hours. HbA1C: No results for input(s): "HGBA1C" in the last 72 hours. CBG: Recent Labs  Lab 11/06/23 0738 11/06/23 1146 11/06/23 1645 11/06/23 2110 11/07/23 0745  GLUCAP 386* 426* 301* 395* 406*   Lipid Profile: Recent Labs    11/06/23 0504  TRIG 572*   Thyroid Function Tests: No results for input(s): "TSH", "T4TOTAL", "FREET4", "T3FREE", "THYROIDAB" in the last 72 hours. Anemia Panel: No results for input(s): "VITAMINB12", "FOLATE", "FERRITIN", "TIBC", "IRON ", "RETICCTPCT" in the last 72 hours. Sepsis Labs: No results for input(s): "PROCALCITON", "LATICACIDVEN" in the last 168 hours.  No results found for this or any previous visit (from the past 240 hours).        Radiology Studies: PERIPHERAL VASCULAR CATHETERIZATION Result Date: 11/05/2023 See surgical note for result.       Scheduled Meds:  acetaminophen   500 mg Oral Q6H   arformoterol   15 mcg Nebulization BID   ascorbic acid   500 mg Oral BID   aspirin   81 mg Oral Daily   atorvastatin   20 mg Oral Daily   bisacodyl   5 mg Oral Daily   budesonide  (PULMICORT ) nebulizer solution  0.25 mg Nebulization BID   busPIRone   30 mg Oral TID   clopidogrel   75 mg Oral Daily   vitamin B-12  1,000 mcg Oral Daily   docusate sodium   100 mg Oral BID   enoxaparin  (LOVENOX ) injection  0.5 mg/kg Subcutaneous Q24H   famotidine   20 mg Oral  Daily   insulin  aspart  0-15 Units Subcutaneous TID AC & HS   insulin  aspart  30 Units Subcutaneous TID WC   insulin  glargine-yfgn  75 Units Subcutaneous BID   iron  polysaccharides  150 mg Oral Daily   losartan   100 mg Oral Daily   multivitamin with minerals  1 tablet Oral Daily   nutrition supplement (JUVEN)  1 packet Oral BID BM   polyethylene glycol  17 g Oral Daily   pregabalin   200 mg Oral BID   Ensure Max Protein  11 oz Oral BID   zinc  sulfate (50mg  elemental zinc )  220 mg Oral Daily   Continuous Infusions:   LOS: 12 days       [employer]  Leonel Ram, MD [facility] Pager [phone]  If 7PM-7AM, please contact night-coverage [email or web address] Password [facility] 11/07/2023, 8:32 AM

## 2023-11-07 NOTE — Inpatient Diabetes Management (Signed)
 Inpatient Diabetes Program Recommendations  AACE/ADA: New Consensus Statement on Inpatient Glycemic Control (2015)  Target Ranges:  Prepandial:   less than 140 mg/dL      Peak postprandial:   less than 180 mg/dL (1-2 hours)      Critically ill patients:  140 - 180 mg/dL   Lab Results  Component Value Date   GLUCAP 406 (H) 11/07/2023   HGBA1C 8.9 (H) 10/27/2023    Review of Glycemic Control  Diabetes history: DM2 Outpatient Diabetes medications: Lantus  100 BID, Humalog  25 BID Current orders for Inpatient glycemic control: Semglee  75 BID, Novolog  0-15 TID with meals and 0-5 HS + 30 units TID  383, 406 this am Extremely resistant  Inpatient Diabetes Program Recommendations:    Consider:  Increasing Semglee  to 85 units BID  Increasing Novolog  to 34 units TID  ? Whether pt needs nutritional supplements if eating 100%  Follow trends.  Thank you. Joni Net, RD, LDN, CDCES Inpatient Diabetes Coordinator [phone]

## 2023-11-07 NOTE — TOC Progression Note (Signed)
 Transition of Care [facility]) - Progression Note    Patient Details  Name: Othman Masur MRN: [id number] Date of Birth: [birth year]  [facility] CM/SW Contact  Alexandra Ice, RN Phone Number: 11/07/2023, 4:07 PM  Clinical Narrative:     Met with patient at bedside to discuss bed offers. He states his wife is coming around 3:30p and would like to discuss with her. TOC asked that he contact CM when decision is made. TOC explained he is medically ready to discharge from hospital and will need to make a decision. He verbalized understanding.   Expected Discharge Plan:  (TBD) Barriers to Discharge: No Barriers Identified  Expected Discharge Plan and Services   Discharge Planning Services: CM Consult Post Acute Care Choice:  (Pending PT/OT evaluations.) Living arrangements for the past 2 months: Single Family Home Expected Discharge Date: 11/07/23                                     Social Determinants of Health (SDOH) Interventions SDOH Screenings   Food Insecurity: Patient Declined (10/27/2023)  Housing: Patient Declined (10/27/2023)  Transportation Needs: Patient Declined (10/27/2023)  Utilities: Patient Declined (10/27/2023)  Alcohol Screen: Low Risk  (05/08/2017)  Social Connections: Patient Declined (10/27/2023)  Tobacco Use: High Risk (10/26/2023)    Readmission Risk Interventions     No data to display

## 2023-11-07 NOTE — Plan of Care (Signed)
  Problem: Education: Goal: Ability to describe self-care measures that may prevent or decrease complications (Diabetes Survival Skills Education) will improve Outcome: Progressing   Problem: Coping: Goal: Ability to adjust to condition or change in health will improve Outcome: Progressing   Problem: Activity: Goal: Risk for activity intolerance will decrease Outcome: Progressing   

## 2023-11-07 NOTE — Progress Notes (Signed)
 Physical Therapy Treatment Patient Details Name: Martin Riddle MRN: [id number] DOB: 1971-04-29 Today's Date: 11/07/2023   History of Present Illness Pt is a 53 y.o. 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 RR called 5/20 with pt unresponsive requiring intubation, off vent 5/22. PMH significant for DM, atherosclerosis of native arteries of the extremities with ulceration, diabetic foot ulcer, HTN, HLD, depression, chronic venous stasis in both legs, pancreatitis, peripheral neuropathy, & morbid obesity.    PT Comments  Pt was sitting in recliner upon arrival. He continues to be more motivated and cooperative than previously observed in admission. Pt eager to get out of room for change in location. He tolerated standing to bariatric RW and hopping to manual bariatric w/c.  Educated on how to self propel and navigate hallways with w/c. He was able to self propel 3 x ~ 120 ft but does fatigue quickly overall. Pt's son and father arrived during session. Reviewed post acute needs and expectations. Acute PT will continue to follow and progress per current POC.    If plan is discharge home, recommend the following: A little help with walking and/or transfers;A lot of help with bathing/dressing/bathroom;Assistance with cooking/housework;Direct supervision/assist for medications management;Direct supervision/assist for financial management;Help with stairs or ramp for entrance;Assist for transportation;Supervision due to cognitive status     Equipment Recommendations  Other (comment) (Bariatric w/c, bariatric RW, and bariatric BSC. Encouraged pt to have someone build ramp for home entry)       Precautions / Restrictions Precautions Precautions: Fall Recall of Precautions/Restrictions: Impaired Restrictions Weight Bearing Restrictions Per Provider Order: Yes RLE Weight Bearing Per Provider Order: Non weight bearing     Mobility  Bed  Mobility  General bed mobility comments: in recliner pre/post session    Transfers Overall transfer level: Needs assistance Equipment used: Rolling walker (2 wheels) (Bariatric) Transfers: Sit to/from Stand Sit to Stand: Min assist Step pivot transfers: Min assist  General transfer comment: Pt was able to stand form recliner and bariatric w/c with min assist. Chartered loss adjuster has to hold RW during transfers    Ambulation/Gait Ambulation/Gait assistance: Min Chemical engineer (Feet): 3 Feet Assistive device: Rolling walker (2 wheels) (Bariatric) Gait Pattern/deviations: Step-to pattern Gait velocity: decreased  General Gait Details: Pt was able to hop on LLE to/from w/c>< recliner. much improved abilities and safety during standing activity   Merchant navy officer mobility: Yes Wheelchair propulsion: Both upper extremities Wheelchair parts: Supervision/cueing Distance: 120 Wheelchair Assistance Details (indicate cue type and reason): pt was able to self prtopel manual w/c 3 x 120 ft. prior to pt self propelling, Chartered loss adjuster educated pt on parts of w/c and how to manage them. pt does well with self propelling howver fatigues quickly      Balance Overall balance assessment: Needs assistance Sitting-balance support: No upper extremity supported, Feet supported Sitting balance-Leahy Scale: Good     Standing balance support: Bilateral upper extremity supported, During functional activity, Reliant on assistive device for balance Standing balance-Leahy Scale: Fair Standing balance comment: pt is at high risk of falls nhowever continues to demonstrate improving balance and safety in standing. reliant on RW with all standing activity however no LOB observed with BUE support       Communication Communication Communication: No apparent difficulties  Cognition Arousal: Alert Behavior During Therapy: WFL for tasks assessed/performed   PT - Cognitive impairments:  Safety/Judgement, Problem solving, Sequencing, Awareness    PT -  Cognition Comments: Pt was much more willing/actually even motivated to participate today. Still has some cognition concerns with poor recall of previous education Following commands: Intact Following commands impaired: Follows multi-step commands with increased time    Cueing Cueing Techniques: Verbal cues, Tactile cues         Pertinent Vitals/Pain Pain Assessment Pain Assessment: 0-10 Pain Score: 8  Pain Location: RLE phantom limb pain Pain Descriptors / Indicators: Throbbing Pain Intervention(s): Limited activity within patient's tolerance, Monitored during session, Premedicated before session, Repositioned     PT Goals (current goals can now be found in the care plan section) Acute Rehab PT Goals Patient Stated Goal: Walk normal again Progress towards PT goals: Progressing toward goals    Frequency    Min 5X/week       Co-evaluation     PT goals addressed during session: Mobility/safety with mobility;Balance;Proper use of DME;Strengthening/ROM        AM-PAC PT "6 Clicks" Mobility   Outcome Measure  Help needed turning from your back to your side while in a flat bed without using bedrails?: None Help needed moving from lying on your back to sitting on the side of a flat bed without using bedrails?: A Little Help needed moving to and from a bed to a chair (including a wheelchair)?: A Little Help needed standing up from a chair using your arms (e.g., wheelchair or bedside chair)?: A Lot Help needed to walk in hospital room?: A Little Help needed climbing 3-5 steps with a railing? : Total 6 Click Score: 16    End of Session   Activity Tolerance: Patient tolerated treatment well;Patient limited by fatigue;Patient limited by pain Patient left: in chair;with call bell/phone within reach;with family/visitor present;with chair alarm set (son and father visiting) Nurse Communication: Mobility status PT  Visit Diagnosis: Other abnormalities of gait and mobility (R26.89);Pain Pain - Right/Left: Right Pain - part of body: Leg     Time: 2952-8413 PT Time Calculation (min) (ACUTE ONLY): 27 min  Charges:    $Therapeutic Activity: 8-22 mins $Wheel Chair Management: 8-22 mins PT General Charges $$ ACUTE PT VISIT: 1 Visit                    Chester Costa PTA 11/07/23, 9:50 AM

## 2023-11-07 NOTE — Plan of Care (Signed)
   Problem: Education: Goal: Knowledge of General Education information will improve Description Including pain rating scale, medication(s)/side effects and non-pharmacologic comfort measures Outcome: Progressing   Problem: Activity: Goal: Risk for activity intolerance will decrease Outcome: Progressing   Problem: Safety: Goal: Ability to remain free from injury will improve Outcome: Progressing

## 2023-11-08 DIAGNOSIS — E11628 Type 2 diabetes mellitus with other skin complications: Secondary | ICD-10-CM | POA: Diagnosis not present

## 2023-11-08 DIAGNOSIS — L089 Local infection of the skin and subcutaneous tissue, unspecified: Secondary | ICD-10-CM | POA: Diagnosis not present

## 2023-11-08 LAB — BASIC METABOLIC PANEL WITH GFR
Anion gap: 9 (ref 5–15)
BUN: 32 mg/dL — ABNORMAL HIGH (ref 6–20)
CO2: 28 mmol/L (ref 22–32)
Calcium: 9 mg/dL (ref 8.9–10.3)
Chloride: 95 mmol/L — ABNORMAL LOW (ref 98–111)
Creatinine, Ser: 0.79 mg/dL (ref 0.61–1.24)
GFR, Estimated: >60 mL/min (ref >60)
Glucose, Bld: 368 mg/dL — ABNORMAL HIGH (ref 70–99)
Potassium: 4.8 mmol/L (ref 3.5–5.1)
Sodium: 132 mmol/L — ABNORMAL LOW (ref 135–145)

## 2023-11-08 LAB — CBC
HCT: 27.2 % — ABNORMAL LOW (ref 39.0–52.0)
Hemoglobin: 8.6 g/dL — ABNORMAL LOW (ref 13.0–17.0)
MCH: 29.9 pg (ref 26.0–34.0)
MCHC: 31.6 g/dL (ref 30.0–36.0)
MCV: 94.4 fL (ref 80.0–100.0)
Platelets: 263 10*3/uL (ref 150–400)
RBC: 2.88 MIL/uL — ABNORMAL LOW (ref 4.22–5.81)
RDW: 17.4 % — ABNORMAL HIGH (ref 11.5–15.5)
WBC: 6.7 10*3/uL (ref 4.0–10.5)
nRBC: 0.6 % — ABNORMAL HIGH (ref 0.0–0.2)

## 2023-11-08 LAB — GLUCOSE, CAPILLARY
Glucose-Capillary: 377 mg/dL — ABNORMAL HIGH (ref 70–99)
Glucose-Capillary: 420 mg/dL — ABNORMAL HIGH (ref 70–99)
Glucose-Capillary: 455 mg/dL — ABNORMAL HIGH (ref 70–99)
Glucose-Capillary: 390 mg/dL — ABNORMAL HIGH (ref 70–99)

## 2023-11-08 MED ORDER — INSULIN ASPART 100 UNIT/ML IJ SOLN
0.0000 [IU] | Freq: Three times a day (TID) | INTRAMUSCULAR | Status: DC
  Administered 2023-11-08 – 2023-11-09 (×2): 20 [IU] via SUBCUTANEOUS
  Filled 2023-11-08 (×2): qty 1

## 2023-11-08 MED ORDER — INSULIN ASPART 100 UNIT/ML IJ SOLN
20.0000 [IU] | Freq: Once | INTRAMUSCULAR | Status: AC
  Administered 2023-11-08: 20 [IU] via SUBCUTANEOUS
  Filled 2023-11-08: qty 1

## 2023-11-08 MED ORDER — ALPRAZOLAM 0.5 MG PO TABS
0.5000 mg | ORAL_TABLET | Freq: Two times a day (BID) | ORAL | Status: DC | PRN
  Administered 2023-11-08 – 2023-11-09 (×2): 0.5 mg via ORAL
  Filled 2023-11-08 (×2): qty 1

## 2023-11-08 NOTE — NC FL2 (Signed)
 [place]  MEDICAID FL2 LEVEL OF CARE FORM     IDENTIFICATION  Patient Name: Martin Riddle Birthdate: 04/29/1971 Sex: male Admission Date (Current Location): 10/26/2023  [facility] and [place] Number:  Chiropodist and Address:  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) 11/06/2023   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   Diabetic foot infection (HCC) 10/26/2023   HTN (hypertension) 10/26/2023   HLD (hyperlipidemia) 10/26/2023   Type 2 diabetes mellitus with peripheral neuropathy (HCC) 10/26/2023   Obesity, Class III, BMI 40-49.9 (morbid obesity) 10/26/2023   Severe sepsis (HCC) 10/26/2023   Depression 10/26/2023   Osteomyelitis of right foot (HCC) 10/26/2023   Hypoglycemia 10/26/2023   Atherosclerosis of native arteries of the extremities with ulceration (HCC) 10/20/2023   Hypertension    Failure to attend appointment 07/07/2022   Cervical radiculopathy 01/07/2022   Lumbar radiculopathy 01/07/2022   Pain syndrome, chronic 01/07/2022   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   History of lumbar spinal fusion 01/07/2022   Chronic venous stasis dermatitis of both lower extremities    Cellulitis 05/30/2021   Multiple open wounds of lower extremity, initial encounter 05/30/2021   Respiratory failure (HCC) 02/19/2018   Severe recurrent major depression with psychotic features (HCC) 01/09/2017   Congenital flat foot 01/16/2015   DDD (degenerative disc  disease), lumbar 01/16/2015   Diabetes mellitus, type 2 (HCC) 01/16/2015   Hypercholesteremia 01/16/2015   Gastro-esophageal reflux disease without esophagitis 01/16/2015   Peripheral neuropathy 01/16/2015    Orientation RESPIRATION BLADDER Height & Weight     Self, Time, Situation, Place  Normal Continent Weight: (!) 162.5 kg Height:  6\' 5"  (195.6 cm)  BEHAVIORAL SYMPTOMS/MOOD NEUROLOGICAL BOWEL NUTRITION STATUS      Continent Diet (Carb-Modified)  AMBULATORY STATUS COMMUNICATION OF NEEDS Skin   Extensive Assist Verbally Surgical wounds       PU Stage 4 Dressing: Daily (Cleanse R foot with Vashe wound cleanser [employer] [phone]) do not rinse and allow to air dry.  Apply Xeroform gauze (Lawson [id number])  to dorsal and plantar aspect of foot to include all toes.  Cover with dry gauze and Kerlix roll gauze.)               Personal Care Assistance Level of Assistance  Bathing, Feeding, Dressing Bathing Assistance: Limited assistance Feeding assistance: Independent Dressing Assistance: Limited assistance     Functional Limitations Info             SPECIAL CARE FACTORS FREQUENCY  PT (By licensed PT), OT (By licensed OT)     PT Frequency: 5 times per week OT Frequency: 5 times per week            Contractures Contractures Info: Not present    Additional Factors Info  Code Status, Allergies Code Status Info: Full Code Allergies Info: Morphine, Codeine           Current Medications (11/08/2023):  This is the current hospital active medication list Current Facility-Administered Medications  Medication Dose Route Frequency Provider Last Rate Last Admin   acetaminophen  (TYLENOL ) tablet 500 mg  500 mg Oral Q6H Dew, Jason S, MD   500 mg at 11/08/23 4098   ALPRAZolam  (XANAX ) tablet 0.5 mg  0.5 mg Oral BID PRN Alphonsus Jeans, MD   0.5 mg at 11/08/23 1191   arformoterol  (BROVANA ) nebulizer solution 15 mcg  15 mcg Nebulization BID Dew, Jason S, MD   15 mcg at 11/08/23 4782    ascorbic acid  (VITAMIN C ) tablet 500 mg  500 mg Oral BID Dew, Jason S, MD   500 mg at 11/07/23 2151   aspirin  chewable tablet 81 mg  81 mg Oral Daily Dew, Jason S, MD   81 mg at 11/07/23 0843   atorvastatin  (LIPITOR) tablet 20 mg  20 mg Oral Daily Dew, Jason S, MD   20 mg at 11/07/23 9562   bisacodyl  (DULCOLAX) EC tablet 5 mg  5 mg Oral Daily Dew, Jason S, MD   5 mg at 11/07/23 1308   bisacodyl  (DULCOLAX) suppository 10 mg  10 mg Rectal Daily PRN Dew, Jason S, MD       budesonide  (PULMICORT ) nebulizer solution 0.25 mg  0.25 mg Nebulization BID Dew, Jason S, MD   0.25 mg at 11/08/23 6578   busPIRone  (BUSPAR ) tablet 30 mg  30 mg Oral TID Dew, Jason S, MD   30 mg at 11/07/23 2151   clopidogrel  (PLAVIX ) tablet 75 mg  75 mg Oral Daily Dew, Jason S, MD   75 mg at 11/07/23 4696   cyanocobalamin  (VITAMIN B12) tablet 1,000 mcg  1,000 mcg Oral Daily Dew, Jason S, MD   1,000 mcg at 11/07/23 0842   dextrose  50 % solution 50 mL  50 mL Intravenous PRN [employer], MD   50 mL at 10/28/23 2336   docusate sodium  (COLACE) capsule 100 mg  100 mg Oral BID Dew, Jason S, MD   100 mg at 11/07/23 2151   enoxaparin  (LOVENOX ) injection 75 mg  0.5 mg/kg Subcutaneous Q24H Dew, Jason S, MD   75 mg at 11/07/23 2151   famotidine  (PEPCID ) tablet 20 mg  20 mg Oral Daily Dew, Jason S, MD   20 mg at 11/07/23 0843   insulin  aspart (novoLOG ) injection 0-15 Units  0-15 Units Subcutaneous TID [facility] & [facility] Alphonsus Jeans, MD   15 Units at 11/08/23 0846   insulin  aspart (novoLOG ) injection 30 Units  30 Units Subcutaneous TID WC Alphonsus Jeans, MD   30 Units at 11/08/23 0846   insulin  glargine-yfgn (SEMGLEE ) injection 75 Units  75 Units Subcutaneous BID Alphonsus Jeans, MD   75 Units at 11/07/23 2156   ipratropium-albuterol  (DUONEB) 0.5-2.5 (3) MG/3ML nebulizer solution 3 mL  3 mL Nebulization Q6H PRN [employer], MD       iron  polysaccharides (NIFEREX) capsule 150 mg  150 mg Oral Daily Dew, Jason S, MD   150 mg at 11/07/23  0847   losartan  (COZAAR ) tablet 100 mg  100 mg Oral Daily Dew, Jason S, MD   100 mg at 11/07/23 2952   methocarbamol  (ROBAXIN ) tablet 500 mg  500 mg Oral Q8H PRN Dew, Jason S, MD   500 mg at 11/08/23 8413   multivitamin with minerals tablet 1 tablet  1 tablet Oral Daily [employer], MD   1 tablet at 11/07/23 2440   nutrition supplement (JUVEN) (JUVEN) powder packet 1 packet  1 packet Oral BID BM Mikki Alexander  S, MD   1 packet at 11/07/23 1514   ondansetron  (ZOFRAN ) injection 4 mg  4 mg Intravenous Q8H PRN Dew, Jason S, MD   4 mg at 10/26/23 2124   Oral care mouth rinse  15 mL Mouth Rinse PRN [employer], MD       oxyCODONE  (Oxy IR/ROXICODONE ) immediate release tablet 10 mg  10 mg Oral Q4H PRN Dew, Jason S, MD       oxyCODONE  (Oxy IR/ROXICODONE ) immediate release tablet 15 mg  15 mg Oral Q4H PRN Dew, Jason S, MD   15 mg at 11/08/23 0847   polyethylene glycol (MIRALAX  / GLYCOLAX ) packet 17 g  17 g Oral Daily Dew, Jason S, MD   17 g at 11/06/23 1001   pregabalin  (LYRICA ) capsule 200 mg  200 mg Oral BID Dew, Jason S, MD   200 mg at 11/07/23 2151   protein supplement (ENSURE MAX) liquid  11 oz Oral BID Dew, Jason S, MD   11 oz at 11/07/23 2202   zinc  sulfate (50mg  elemental zinc ) capsule 220 mg  220 mg Oral Daily Dew, Jason S, MD   220 mg at 11/07/23 4098     Discharge Medications: Please see discharge summary for a list of discharge medications.  Relevant Imaging Results:  Relevant Lab Results:   Additional Information SSN: [id number]  Alexandra Ice, RN

## 2023-11-08 NOTE — Plan of Care (Signed)
  Problem: Education: Goal: Ability to describe self-care measures that may prevent or decrease complications (Diabetes Survival Skills Education) will improve Outcome: Progressing Goal: Individualized Educational Video(s) Outcome: Progressing   Problem: Coping: Goal: Ability to adjust to condition or change in health will improve Outcome: Progressing   Problem: Fluid Volume: Goal: Ability to maintain a balanced intake and output will improve Outcome: Progressing   Problem: Health Behavior/Discharge Planning: Goal: Ability to identify and utilize available resources and services will improve Outcome: Progressing Goal: Ability to manage health-related needs will improve Outcome: Progressing   Problem: Metabolic: Goal: Ability to maintain appropriate glucose levels will improve Outcome: Progressing   Problem: Nutritional: Goal: Maintenance of adequate nutrition will improve Outcome: Progressing Goal: Progress toward achieving an optimal weight will improve Outcome: Progressing   Problem: Skin Integrity: Goal: Risk for impaired skin integrity will decrease Outcome: Progressing   Problem: Tissue Perfusion: Goal: Adequacy of tissue perfusion will improve Outcome: Progressing   Problem: Clinical Measurements: Goal: Ability to avoid or minimize complications of infection will improve Outcome: Progressing   Problem: Skin Integrity: Goal: Skin integrity will improve Outcome: Progressing   Problem: Education: Goal: Knowledge of General Education information will improve Description: Including pain rating scale, medication(s)/side effects and non-pharmacologic comfort measures Outcome: Progressing   Problem: Health Behavior/Discharge Planning: Goal: Ability to manage health-related needs will improve Outcome: Progressing   Problem: Clinical Measurements: Goal: Ability to maintain clinical measurements within normal limits will improve Outcome: Progressing Goal: Will  remain free from infection Outcome: Progressing Goal: Diagnostic test results will improve Outcome: Progressing Goal: Respiratory complications will improve Outcome: Progressing Goal: Cardiovascular complication will be avoided Outcome: Progressing   Problem: Activity: Goal: Risk for activity intolerance will decrease Outcome: Progressing   Problem: Nutrition: Goal: Adequate nutrition will be maintained Outcome: Progressing   Problem: Coping: Goal: Level of anxiety will decrease Outcome: Progressing   Problem: Elimination: Goal: Will not experience complications related to bowel motility Outcome: Progressing Goal: Will not experience complications related to urinary retention Outcome: Progressing   Problem: Pain Managment: Goal: General experience of comfort will improve and/or be controlled Outcome: Progressing   Problem: Safety: Goal: Ability to remain free from injury will improve Outcome: Progressing   Problem: Skin Integrity: Goal: Risk for impaired skin integrity will decrease Outcome: Progressing

## 2023-11-08 NOTE — TOC Progression Note (Addendum)
 Transition of Care [facility]) - Progression Note    Patient Details  Name: Martin Riddle MRN: [id number] Date of Birth: May 29, 1971  [facility] of [facility]) CM/SW Contact  Alexandra Ice, RN Phone Number: 11/08/2023, 9:26 AM  Clinical Narrative:    Met with patient at bedside to discuss bed choice. He chose [facility]. Selected facility via HUB. Will initiate auth.   Expected Discharge Plan:  (TBD) Barriers to Discharge: No Barriers Identified  Expected Discharge Plan and Services   Discharge Planning Services: CM Consult Post Acute Care Choice:  (Pending PT/OT evaluations.) Living arrangements for the past 2 months: Single Family Home Expected Discharge Date: 11/07/23                                     Social Determinants of Health (SDOH) Interventions SDOH Screenings   Food Insecurity: Patient Declined (10/27/2023)  Housing: Patient Declined (10/27/2023)  Transportation Needs: Patient Declined (10/27/2023)  Utilities: Patient Declined (10/27/2023)  Alcohol Screen: Low Risk  (05/08/2017)  Social Connections: Patient Declined (10/27/2023)  Tobacco Use: High Risk (10/26/2023)    Readmission Risk Interventions     No data to display

## 2023-11-08 NOTE — TOC Progression Note (Signed)
 Transition of Care [facility]) - Progression Note    Patient Details  Name: Martin Riddle MRN: [id number] Date of Birth: 14-Jun-1970  Transition of Care [facility]) CM/SW Contact  Alexandra Ice, RN Phone Number: 11/08/2023, 11:01 AM  Clinical Narrative:     Patient received [employer] approval for [facility], SNF approved 11/08/23-11/11/23 #[id number]. Contacted facility and spoke with Laverne, she stated admissions coordinator is Kia ext. [phone], works M-F. They do not take new admissions on the weekend. TOC will follow up on Monday.   Expected Discharge Plan:  (TBD) Barriers to Discharge: No Barriers Identified  Expected Discharge Plan and Services   Discharge Planning Services: CM Consult Post Acute Care Choice:  (Pending PT/OT evaluations.) Living arrangements for the past 2 months: Single Family Home Expected Discharge Date: 11/07/23                                     Social Determinants of Health (SDOH) Interventions SDOH Screenings   Food Insecurity: Patient Declined (10/27/2023)  Housing: Patient Declined (10/27/2023)  Transportation Needs: Patient Declined (10/27/2023)  Utilities: Patient Declined (10/27/2023)  Alcohol Screen: Low Risk  (05/08/2017)  Social Connections: Patient Declined (10/27/2023)  Tobacco Use: High Risk (10/26/2023)    Readmission Risk Interventions     No data to display

## 2023-11-08 NOTE — Plan of Care (Signed)

## 2023-11-08 NOTE — Progress Notes (Signed)
 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  S/p extubation on 10/29/2023. Weaned off of supplemental oxygen. Resolved   Possible OSA: will need outpatient sleep study to confirm dx    Septic shock: secondary to right foot osteomyelitis with gangrene of first metatarsophalangeal joint. See Dr. Wardell Guys note on how pt met septic shock criteria. Resolved   Osteomyelitis: w/ gangrene. 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 Completed abx course w/ unasyn , vanco until 11/02/23. Continue on pregabalin , oxy   PAD: s/p right AKA on 10/28/23. S/p 1 stent placed in left popliteal artery during angiogram 11/05/23 as per vasc surg.Will need to f/u w/ vasc surg in 3 weeks. Vasc surg recs apprec    Hyperkalemia: resolved   AKI: resolved  Hyponatremia: labile. Will continue to monitor    DM2: poorly controlled, HbA1c 8.9. Hypoglycemia episodes have resolved. Continue on glargine, aspart, SSI w/ accuchecks. Pt was d/c from Dr. Arnette Bias practice, endo, b/c of medical nonadherence several years ago    Acute toxic metabolic encephalopathy: resolved   Depression: severity unknown. Continue on home dose of buspar     General weakness: PT/OT recs CIR. Pt will be d/c to SNF   Morbid obesity: BMI 42.4. Complicates overall care and prognosis      DVT prophylaxis:   lovenox  Code Status: full  Family Communication:  Disposition Plan: possibly d/c to CIR vs SNF  Level of care: Med-Surg  Status is: Inpatient Remains inpatient appropriate because: medically stable. Will likely be d/c to SNF tomorrow as per CM      Consultants:  ICU ID  Procedures:   Antimicrobials:    Subjective: Pt c/o right stump pain.   Objective: Vitals:   11/07/23 2032 11/08/23 0331 11/08/23 0728 11/08/23 0803  BP: 132/81 132/76  (!) 146/73  Pulse: 88 86  89  Resp: 18 20  18   Temp: (!) 97.5 F (36.4 C) 97.9 F (36.6 C)  98.1 F (36.7 C)  TempSrc:  Oral  Oral  SpO2: 96% 97% 97% (!) 89%  Weight:      Height:        Intake/Output Summary (Last 24 hours) at 11/08/2023 8119 Last data filed at 11/08/2023 0520 Gross per 24 hour  Intake --  Output 4800 ml  Net -4800 ml   Filed Weights   10/26/23 1804 10/27/23 1247 11/05/23 1300  Weight: (!) 181.4 kg (!) 150.2 kg (!) 162.5 kg    Examination:  General exam: appears lethargic. Morbidly obese  Respiratory system: decreased breath sounds b/l  Cardiovascular system: S1 & S2+. No rubs or gallops   Gastrointestinal system: abd is soft, NT, obese & hypoactive bowel sounds Central nervous system: lethargic  Psychiatry: judgement and insight appears at baseline. Flat mood and affect    Data Reviewed: I have personally reviewed following labs and imaging studies  CBC: Recent Labs  Lab 11/05/23 1023 11/06/23 0504 11/07/23 0515 11/08/23 0515  WBC 6.8 7.0 6.7 6.7  HGB 13.4 8.5* 9.0* 8.6*  HCT 43.3 26.9* 28.9* 27.2*  MCV 92.7 93.1 93.2 94.4  PLT 252 295 288 263   Basic Metabolic Panel: Recent Labs  Lab 11/01/23 2139 11/05/23 1023 11/05/23 2124 11/06/23 0504 11/07/23 0515 11/08/23 0515  NA 129* 134*  --  132* 133* 132*  K 4.8 5.6* 4.5 4.8 4.7 4.8  CL 93* 99  --  96* 96* 95*  CO2 26 29  --  29 27 28   GLUCOSE 445* 433*  --  355* 383* 368*  BUN 29* 40*  --  27* 34* 32*  CREATININE 1.00 1.12  --  1.11  1.00 0.79  CALCIUM  7.8* 9.2  --  9.1 9.0 9.0   GFR: Estimated Creatinine Clearance: 179 mL/min (by C-G formula based on SCr of 0.79 mg/dL). Liver Function Tests: No results for input(s): "AST", "ALT", "ALKPHOS", "BILITOT", "PROT", "ALBUMIN " in the last 168 hours. No results for input(s): "LIPASE", "AMYLASE" in the last 168 hours. No results for input(s): "AMMONIA" in the last 168 hours. Coagulation Profile: No results for input(s): "INR", "PROTIME" in the last 168 hours. Cardiac Enzymes: No results for input(s): "CKTOTAL", "CKMB", "CKMBINDEX", "TROPONINI" in the last 168 hours. BNP (last 3 results) No results for input(s): "PROBNP" in the last 8760 hours. HbA1C: No results for input(s): "HGBA1C" in the last 72 hours. CBG: Recent Labs  Lab 11/07/23 0745 11/07/23 1148 11/07/23 1654 11/07/23 2131 11/08/23 0747  GLUCAP 406* 446* 448* 406* 377*   Lipid Profile: Recent Labs    11/06/23 0504  TRIG 572*   Thyroid Function Tests: No results for input(s): "TSH", "T4TOTAL", "FREET4", "T3FREE", "THYROIDAB" in the last 72 hours. Anemia Panel: No results for input(s): "VITAMINB12", "FOLATE", "FERRITIN", "TIBC", "IRON ", "RETICCTPCT" in the last 72 hours. Sepsis Labs: No results for input(s): "PROCALCITON", "LATICACIDVEN" in the last 168 hours.  No results found for this or any previous visit (from the past 240 hours).        Radiology Studies: No results found.       Scheduled Meds:  acetaminophen   500 mg Oral Q6H   arformoterol   15 mcg Nebulization BID   ascorbic acid   500 mg Oral BID   aspirin   81 mg Oral Daily   atorvastatin   20 mg Oral Daily   bisacodyl   5 mg Oral Daily   budesonide  (PULMICORT ) nebulizer solution  0.25 mg Nebulization BID   busPIRone   30 mg Oral TID   clopidogrel   75 mg Oral Daily   vitamin B-12  1,000 mcg Oral Daily   docusate sodium   100 mg Oral BID   enoxaparin  (LOVENOX ) injection  0.5 mg/kg Subcutaneous Q24H   famotidine   20 mg Oral Daily    insulin  aspart  0-15 Units Subcutaneous TID AC & HS   insulin  aspart  30 Units Subcutaneous TID WC   insulin  glargine-yfgn  75 Units Subcutaneous BID   iron  polysaccharides  150 mg Oral Daily   losartan   100 mg Oral Daily   multivitamin with minerals  1 tablet Oral Daily   nutrition supplement (JUVEN)  1 packet Oral BID BM   polyethylene glycol  17 g Oral Daily   pregabalin   200 mg Oral BID   Ensure Max Protein  11 oz Oral BID   zinc  sulfate (50mg  elemental zinc )  220 mg Oral Daily   Continuous Infusions:   LOS: 13  days       Alphonsus Jeans, MD [facility] Pager [phone]  If 7PM-7AM, please contact night-coverage [email or web address] 11/08/2023, 8:12 AM

## 2023-11-09 LAB — BASIC METABOLIC PANEL WITH GFR
Anion gap: 11 (ref 5–15)
BUN: 33 mg/dL — ABNORMAL HIGH (ref 6–20)
CO2: 27 mmol/L (ref 22–32)
Calcium: 9.3 mg/dL (ref 8.9–10.3)
Chloride: 92 mmol/L — ABNORMAL LOW (ref 98–111)
Creatinine, Ser: 1 mg/dL (ref 0.61–1.24)
GFR, Estimated: >60 mL/min (ref >60)
Glucose, Bld: 360 mg/dL — ABNORMAL HIGH (ref 70–99)
Potassium: 4.5 mmol/L (ref 3.5–5.1)
Sodium: 130 mmol/L — ABNORMAL LOW (ref 135–145)

## 2023-11-09 LAB — GLUCOSE, CAPILLARY
Glucose-Capillary: 409 mg/dL — ABNORMAL HIGH (ref 70–99)
Glucose-Capillary: 428 mg/dL — ABNORMAL HIGH (ref 70–99)
Glucose-Capillary: 412 mg/dL — ABNORMAL HIGH (ref 70–99)

## 2023-11-09 LAB — TRIGLYCERIDES: Triglycerides: 1016 mg/dL — ABNORMAL HIGH (ref <150)

## 2023-11-09 LAB — CBC
HCT: 32.9 % — ABNORMAL LOW (ref 39.0–52.0)
Hemoglobin: 10.1 g/dL — ABNORMAL LOW (ref 13.0–17.0)
MCH: 29.4 pg (ref 26.0–34.0)
MCHC: 30.7 g/dL (ref 30.0–36.0)
MCV: 95.9 fL (ref 80.0–100.0)
Platelets: 291 10*3/uL (ref 150–400)
RBC: 3.43 MIL/uL — ABNORMAL LOW (ref 4.22–5.81)
RDW: 17.8 % — ABNORMAL HIGH (ref 11.5–15.5)
WBC: 6.7 10*3/uL (ref 4.0–10.5)
nRBC: 0.6 % — ABNORMAL HIGH (ref 0.0–0.2)

## 2023-11-09 MED ORDER — INSULIN ASPART 100 UNIT/ML IJ SOLN: 0.0000 [IU] | Freq: Three times a day (TID) | INTRAMUSCULAR | Status: AC

## 2023-11-09 MED ORDER — INSULIN GLARGINE-YFGN 100 UNIT/ML ~~LOC~~ SOLN
90.0000 [IU] | Freq: Two times a day (BID) | SUBCUTANEOUS | Status: DC
  Administered 2023-11-09: 90 [IU] via SUBCUTANEOUS
  Filled 2023-11-09 (×2): qty 0.9

## 2023-11-09 MED ORDER — INSULIN GLARGINE-YFGN 100 UNIT/ML ~~LOC~~ SOLN
90.0000 [IU] | Freq: Two times a day (BID) | SUBCUTANEOUS | Status: AC
Start: 2023-11-09 — End: ?

## 2023-11-09 MED ORDER — INSULIN ASPART 100 UNIT/ML IJ SOLN
0.0000 [IU] | Freq: Three times a day (TID) | INTRAMUSCULAR | Status: DC
  Administered 2023-11-09: 20 [IU] via SUBCUTANEOUS
  Filled 2023-11-09: qty 1

## 2023-11-09 MED ORDER — CYANOCOBALAMIN 1000 MCG PO TABS: 1000.0000 ug | ORAL_TABLET | Freq: Every day | ORAL | 0 refills | Status: AC

## 2023-11-09 MED ORDER — CLOPIDOGREL BISULFATE 75 MG PO TABS: 75.0000 mg | ORAL_TABLET | Freq: Every day | ORAL | 0 refills | Status: AC

## 2023-11-09 MED ORDER — PREGABALIN 200 MG PO CAPS: 200.0000 mg | ORAL_CAPSULE | Freq: Two times a day (BID) | ORAL | 0 refills | Status: AC

## 2023-11-09 MED ORDER — OXYCODONE HCL 10 MG PO TABS
10.0000 mg | ORAL_TABLET | ORAL | 0 refills | Status: AC | PRN
Start: 2023-11-09 — End: 2023-11-11

## 2023-11-09 MED ORDER — ASPIRIN 81 MG PO CHEW
81.0000 mg | CHEWABLE_TABLET | Freq: Every day | ORAL | 0 refills | Status: AC
Start: 2023-11-09 — End: 2023-12-09

## 2023-11-09 MED ORDER — POLYSACCHARIDE IRON COMPLEX 150 MG PO CAPS: 150.0000 mg | ORAL_CAPSULE | Freq: Every day | ORAL | 0 refills | Status: DC

## 2023-11-09 NOTE — Discharge Summary (Signed)
 Physician Discharge Summary  Martin Riddle ZOX:[id number] DOB: May 27, 1971 DOA: 10/26/2023  PCP: Thomasene Flemings, NP  Admit date: 10/26/2023 Discharge date: 11/09/2023  Admitted From: home  Disposition:  SNF  Recommendations for Outpatient Follow-up:  Follow up with PCP in 1-2 weeks F/u w/ vasc surg, Dr. Prescilla Brod, in 3 weeks  Home Health: no  Equipment/Devices:  Discharge Condition: stable  CODE STATUS: full  Diet recommendation:  Carb Modified  Brief/Interim Summary: HPI was taken from Dr. Rosalea Collin:  Martin Riddle is a 53 y.o. male with medical history significant of DM, atherosclerosis of native arteries of the extremities with ulceration, diabetic foot ulcer, HTN, HLD, depression, chronic venous stasis in both legs, pancreatitis, peripheral neuropathy, morbid obesity with BMI of 47, who presents with right foot wound infection.   Pt state that he has right foot wound for more than 3 weeks, which has been progressively worsening, with foul smell. His right foot is painful, which is constant, sharp, moderate to severe, nonradiating.  Denies fever or chills.  Patient had chest discomfort earlier, which has resolved.  Currently no leg chest pain, cough, SOB.  No nausea, vomiting, diarrhea or abdominal pain.  No symptoms of UTI. Of note, patient was seen by vascular surgeon, Dr. Vonna Guardian on 5/13 and had a negative DVT study.  Dr. Vonna Guardian recommended an angiography with possible revascularization.     Data reviewed independently and ED Course: pt was found to have WBC 11.3, lactic acid 2.3, GFR> 60, temperature normal, blood pressure 139/69, heart rate 99 --> 103, RR 25, oxygen saturation 96% on room air.  Patient is admitted to MedSurg bed as inpatient.  Dr. Prescilla Brod for VVS and Dr. Rosemarie Conquest of podiatry are consulted.     X-ray right foot: 1. Large soft tissue ulceration of the distal first toe with marked erosive changes of the first distal phalanx extending to the interphalangeal joint space.  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 2. Soft tissue gas surrounding the first proximal phalanx, concerning for infection with gas-forming organism.   As per Dr. Leory Rands: Martin Riddle is a 53 y.o. male with medical history significant for DM, atherosclerosis of native arteries of the extremities with ulceration, diabetic foot ulcer, HTN, HLD, depression, chronic venous stasis in both legs, pancreatitis, peripheral neuropathy, morbid obesity, who presented to the hospital with right foot wound infection.  He complained of pain in the right foot and also noticed a foul smell.  He had had the wound for more than 3 weeks.  He had been previously evaluated by Dr. Vonna Guardian, vascular surgeon, on 10/20/2023, and angiography and possible revascularization had been recommended on that visit.   He was admitted to the hospital for severe sepsis secondary to right foot infection/osteomyelitis.  He was started on empiric IV Zosyn  and vancomycin .     Significant Hospital Events: Including procedures, antibiotic start and stop dates in addition to other pertinent events   05/19: Pt admitted to the [facility] unit with gangrene of right foot, however he remained in the ER pending bed availability 05/20: Rapid response initiated pt minimally responsive found to be hypoglycemic CBG <10 pt received 1 amp of D50W and placed on Bipap and transported to the stepdown unit.  He was initially IVC'd by psychiatry due to concern of inability to make sound medical decisions for himself.  Pts mentation improved and he was transitioned to nasal canula with improvement in CBG's.  Psych rescinded the IVC and pt deemed competent to make medical decisions.  Unfortunately, later during the shift pt became unresponsive and hypoxic requiring mechanical intubation 10/28/23- patient scheduled for amputation today due to critical limb ischemia with gangrene of bilateral lower extermities. He remains on MV with sedation.   10/29/23- s/p right amputation.  Blood glucose stable. For bedside SLP today which he passed and diet has been initiated. He is liberated off ventilator.     As per Dr. Broadus Canes 5/28-11/09/23: Pt has remained medically stable. Pt had left leg angiogram on 11/05/23 & 1 stent placed in left popliteal artery as per vasc surg. Pt's home dose of buspar  was restart for depression. Also, pt's home dose of pregabalin  was increased to help w/ nerve pain from right AKA. PT/OT evaluated the pt and recommend CIR. Pt was d/c to SNF.   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  S/p extubation on 10/29/2023. Weaned off of supplemental oxygen. Resolved   Possible OSA: will need outpatient sleep study to confirm dx    Septic shock: secondary to right foot osteomyelitis with gangrene of first metatarsophalangeal joint. See Dr. Wardell Guys note on how pt met septic shock criteria. Resolved   Osteomyelitis: w/ gangrene. 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 Completed abx course w/ unasyn , vanco until 11/02/23. Continue on pregabalin , oxy   PAD: s/p right AKA on 10/28/23. S/p 1 stent placed in left popliteal artery during angiogram 11/05/23 as per vasc surg. Continue on statin, plavix , & aspirin  as per vasc surg. Will need to f/u w/ vasc surg in 3 weeks. Vasc surg recs apprec    Hyperkalemia: resolved   AKI: resolved  Hyponatremia: labile. Will continue to monitor    DM2: poorly controlled, HbA1c 8.9.  Hypoglycemia episodes have resolved. Continue on glargine, aspart, SSI w/ accuchecks. Pt was d/c from Dr. Arnette Bias practice, endo, b/c of medical nonadherence several years ago    Acute toxic metabolic encephalopathy: resolved   Depression: severity unknown. Continue on home dose of buspar     General weakness: PT/OT recs CIR. Pt will be d/c to SNF   Morbid obesity: BMI 42.4. Complicates overall care and prognosis   Discharge Instructions  Discharge Instructions     Ambulatory referral to Sleep Studies   Complete by: As directed    Diet - low sodium heart healthy   Complete by: As directed    Diet Carb Modified   Complete by: As directed    Discharge instructions   Complete by: As directed    F/u w/ PCP in 1-2 weeks. F/u w/ vasc surg, Dr. Vonna Guardian, in 3 weeks.   Increase activity slowly   Complete by: As directed    No wound care   Complete by: As directed       Allergies as of 11/09/2023       Reactions   Morphine Other (See Comments)   headache     A HEADACHE AND SWEATING AND LETHARGIC headache headache, ,  A HEADACHE AND SWEATING AND LETHARGIC headache   Morphine And Codeine  Other (See Comments)   headache   Other Other (See Comments)   A HEADACHE AND SWEATING AND LETHARGIC    headache A HEADACHE AND SWEATING AND LETHARGIC, , headache        Medication List     STOP taking these medications    gemfibrozil  600 MG tablet Commonly known as: LOPID    HumaLOG  100 UNIT/ML injection Generic drug: insulin  lispro   insulin  NPH Human 100 UNIT/ML injection Commonly known as: NOVOLIN N   insulin  regular 100 units/mL injection Commonly known as: NOVOLIN R   Lantus  100 UNIT/ML injection Generic drug: insulin  glargine   losartan -hydrochlorothiazide  100-25 MG tablet Commonly known as: HYZAAR   naproxen sodium 220 MG tablet Commonly known as: ALEVE   torsemide  20 MG tablet Commonly known as: DEMADEX        TAKE these medications    acetaminophen  500 MG  tablet Commonly known as: TYLENOL  Take 1,000 mg by mouth every 6 (six) hours as needed for mild pain or moderate pain.   aspirin  81 MG chewable tablet Chew 1 tablet (81 mg total) by mouth daily.   atorvastatin  20 MG tablet Commonly known as: LIPITOR Take 1 tablet (20 mg total) by mouth daily.   busPIRone  30 MG tablet Commonly known as: BUSPAR  Take 30 mg by mouth 3 (three) times daily.   cetirizine 10 MG tablet Commonly known as: ZYRTEC Take 10 mg by mouth daily.   cloNIDine  0.2 MG tablet Commonly known as: CATAPRES  Take 0.2 mg by mouth 3 (three) times daily.   clopidogrel  75 MG tablet Commonly known as: PLAVIX  Take 1 tablet (75 mg total) by mouth daily.   cyanocobalamin  1000 MCG tablet Take 1 tablet (1,000 mcg total) by mouth daily.   DULoxetine  60 MG capsule Commonly known as: CYMBALTA  Take 60 mg by mouth daily.   insulin  aspart 100 UNIT/ML injection Commonly known as: novoLOG  Inject 30 Units into the skin 3 (three) times daily with meals.   insulin  aspart 100 UNIT/ML injection Commonly known as: novoLOG  Inject 0-20 Units into the skin 4 (four) times daily -  before meals and at bedtime. CBG 70 - 120: 0 units CBG 121 - 150: 3 units CBG 151 - 200: 4 units CBG 201 - 250: 7 units CBG 251 - 300: 11 units CBG 301 - 350: 15 units CBG 351 - 400: 20 units   insulin  glargine-yfgn 100 UNIT/ML injection Commonly known as: SEMGLEE  Inject 0.9 mLs (90 Units total) into the skin 2 (two) times daily.   iron  polysaccharides 150 MG capsule Commonly known as: NIFEREX Take 1 capsule (150 mg total) by mouth daily.   losartan  100 MG tablet Commonly known as: COZAAR  Take 100 mg by mouth.   multivitamin with minerals Tabs tablet Take 1 tablet by mouth daily.   nitroGLYCERIN  0.4 MG SL tablet Commonly known as: NITROSTAT  Place under the tongue.   Oxycodone  HCl 10 MG Tabs Take 1 tablet (10 mg total) by mouth every 4 (four) hours as needed for up to 2 days for moderate pain  (pain score 4-6) or severe pain (pain score 7-10).   polyethylene glycol 17 g packet Commonly known as: MIRALAX  / GLYCOLAX  Take 17 g by mouth daily.   pregabalin  200 MG capsule Commonly known as: LYRICA  Take 1 capsule (200 mg total) by mouth 2 (two) times daily. What changed:  medication strength how much to take when to take this   rOPINIRole 1 MG tablet Commonly known as: REQUIP Take 1 mg by  mouth at bedtime.        Contact information for follow-up providers     Schnier, Ninette Basque, MD Follow up in 3 week(s).   Specialties: Vascular Surgery, Cardiology, Radiology, Vascular Surgery Why: Staple removal Contact information: [address] [place] [address] [phone]         Thomasene Flemings, NP Follow up.   Specialty: Nurse Practitioner Why: F/u in 1-2 weeks Contact information: [address] [place] [facility] [address] [phone]              Contact information for after-discharge care     Destination     [facility]  .   Service: Skilled Nursing Contact information: [address] [place]  [phone]                    Allergies  Allergen Reactions   Morphine Other (See Comments)    headache     A HEADACHE AND SWEATING AND LETHARGIC headache  headache, ,  A HEADACHE AND SWEATING AND LETHARGIC headache   Morphine And Codeine Other (See Comments)    headache   Other Other (See Comments)    A HEADACHE AND SWEATING AND LETHARGIC    headache  A HEADACHE AND SWEATING AND LETHARGIC, , headache    Consultations: Vasc surg  ID Podiatry  ICU   Procedures/Studies: PERIPHERAL VASCULAR CATHETERIZATION Result Date: 11/05/2023 See surgical note for result.  DG Abd 1 View Result Date: 10/27/2023 CLINICAL DATA:  OG tube placement EXAM: ABDOMEN - 1 VIEW COMPARISON:  02/19/2018 FINDINGS: Enteric tube tip slightly low lying in the left mid to lower quadrant presumably within  body of stomach. Visualized gas pattern is unremarkable IMPRESSION: Enteric tube tip slightly low lying in the left mid to lower quadrant presumably within body of stomach. Electronically Signed   By: Esmeralda Hedge M.D.   On: 10/27/2023 19:31   DG Chest Port 1 View Result Date: 10/27/2023 CLINICAL DATA:  ET and OG tube placement EXAM: PORTABLE CHEST 1 VIEW COMPARISON:  10/27/2023 FINDINGS: Interval intubation, tip of the endotracheal tube is about 4.6 cm superior to carina. Enteric tube tip below the diaphragm but incompletely assessed. Enlarged cardiomediastinal silhouette with vascular congestion and worsening airspace disease at the left base. Possible small left effusion. No pneumothorax IMPRESSION: 1. Interval intubation, tip of the endotracheal tube about 4.6 cm superior to carina. Enteric tube tip below the diaphragm but incompletely assessed. 2. Enlarged cardiomediastinal silhouette with vascular congestion and worsening airspace disease and/or effusion at the left base. Electronically Signed   By: Esmeralda Hedge M.D.   On: 10/27/2023 19:30   CT Angio Chest Pulmonary Embolism (PE) W or WO Contrast Result Date: 10/27/2023 CLINICAL DATA:  Diabetic right foot ulceration and hypoxia. EXAM: CT ANGIOGRAPHY CHEST WITH CONTRAST TECHNIQUE: Multidetector CT imaging of the chest was performed using the standard protocol during bolus administration of intravenous contrast. Multiplanar CT image reconstructions and MIPs were obtained to evaluate the vascular anatomy. RADIATION DOSE REDUCTION: This exam was performed according to the departmental dose-optimization program which includes automated exposure control, adjustment of the mA and/or kV according to patient size and/or use of iterative reconstruction technique. CONTRAST:  OMNIPAQUE  IOHEXOL  350 MG/ML SOLN COMPARISON:  None Available. FINDINGS: Cardiovascular: Pulmonary arterial opacification is not optimal on the study. There is no evidence of significant  central or lobar pulmonary embolism. The main pulmonary artery is top-normal in caliber measuring approximately 3 cm.  The thoracic aorta is normal in caliber. No significant aortic atherosclerosis. The heart size is normal. No pericardial fluid identified. Calcified coronary artery plaque present. Mediastinum/Nodes: Mild relative enlargement of the right lobe of the thyroid gland compared to the left with dystrophic calcifications in the right lobe and a small 6 mm low-density nodule in the superior right lobe. No lymphadenopathy identified. Lungs/Pleura: No overt pulmonary edema, pleural fluid or pneumothorax. Bibasilar atelectasis and scarring. No focal masses identified. Upper Abdomen: No acute abnormality. Musculoskeletal: No chest wall abnormality. No acute or significant osseous findings. Review of the MIP images confirms the above findings. IMPRESSION: 1. Suboptimal peripheral pulmonary arterial opacification. No evidence of significant central or lobar pulmonary embolism. 2. Bibasilar atelectasis and scarring. 3. Calcified coronary artery plaque. 4. Mild relative enlargement of the right lobe of the thyroid gland compared to the left with dystrophic calcifications in the right lobe and a small 6 mm low-density nodule in the superior right lobe. Not clinically significant; no follow-up imaging recommended (ref: J Am Coll Radiol. 2015 Feb;12(2): 143-50). Electronically Signed   By: Erica Hau M.D.   On: 10/27/2023 11:08   DG Chest Port 1 View Result Date: 10/27/2023 CLINICAL DATA:  Acute hypoxic respiratory failure. EXAM: PORTABLE CHEST 1 VIEW COMPARISON:  May 30, 2021. FINDINGS: The heart size and mediastinal contours are within normal limits. Stable minimal left basilar scarring. No acute pulmonary disease is noted. The visualized skeletal structures are unremarkable. IMPRESSION: No active disease. Electronically Signed   By: Rosalene Colon M.D.   On: 10/27/2023 10:50   DG Foot 2 Views  Right Result Date: 10/26/2023 CLINICAL DATA:  Foot infection EXAM: RIGHT FOOT - 2 VIEW COMPARISON:  Right foot x-ray 05/30/2021. FINDINGS: There is diffuse soft tissue swelling of the foot, particularly the first toe. There is soft tissue gas surrounding the first proximal phalanx. There is large soft tissue ulceration of the distal first toe. There are marked erosive changes of the mid and distal aspect of the first distal phalanx with fragmentation of the remaining proximal aspect of the first distal phalanx. This extends to the first interphalangeal joint space. The first proximal phalanx appears within normal limits. IMPRESSION: 1. Large soft tissue ulceration of the distal first toe with marked erosive changes of the first distal phalanx extending to the interphalangeal joint space. 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 2. Soft tissue gas surrounding the first proximal phalanx, concerning for infection with gas-forming organism. Electronically Signed   By: Tyron Gallon M.D.   On: 10/26/2023 22:11   (Echo, Carotid, EGD, Colonoscopy, ERCP)    Subjective: Pt c/o stump pain    Discharge Exam: Vitals:   11/09/23 0437 11/09/23 0736  BP: 135/71 (!) 144/81  Pulse: 89 89  Resp: 20 16  Temp: (!) 97.5 F (36.4 C) 98.2 F (36.8 C)  SpO2: 94% 94%   Vitals:   11/08/23 1626 11/08/23 2012 11/09/23 0437 11/09/23 0736  BP: (!) 145/64 (!) 142/67 135/71 (!) 144/81  Pulse: 90 89 89 89  Resp: 18 17 20 16   Temp: 98.3 F (36.8 C) 98.2 F (36.8 C) (!) 97.5 F (36.4 C) 98.2 F (36.8 C)  TempSrc: Oral Oral  Oral  SpO2: 93% 96% 94% 94%  Weight:      Height:        General: Pt is alert, awake, not in acute distress Cardiovascular: S1/S2 +, no rubs, no gallops Respiratory: CTA bilaterally, no wheezing, no rhonchi Abdominal: Soft, NT, obese, bowel  sounds + Extremities: R AKA. No cyanosis    The results of significant diagnostics from this hospitalization (including imaging,  microbiology, ancillary and laboratory) are listed below for reference.     Microbiology: No results found for this or any previous visit (from the past 240 hours).   Labs: BNP (last 3 results) Recent Labs    10/27/23 0459  BNP 67.3   Basic Metabolic Panel: Recent Labs  Lab 11/05/23 1023 11/05/23 2124 11/06/23 0504 11/07/23 0515 11/08/23 0515 11/09/23 0426  NA 134*  --  132* 133* 132* 130*  K 5.6* 4.5 4.8 4.7 4.8 4.5  CL 99  --  96* 96* 95* 92*  CO2 29  --  29 27 28 27   GLUCOSE 433*  --  355* 383* 368* 360*  BUN 40*  --  27* 34* 32* 33*  CREATININE 1.12  --  1.11 1.00 0.79 1.00  CALCIUM  9.2  --  9.1 9.0 9.0 9.3   Liver Function Tests: No results for input(s): "AST", "ALT", "ALKPHOS", "BILITOT", "PROT", "ALBUMIN " in the last 168 hours. No results for input(s): "LIPASE", "AMYLASE" in the last 168 hours. No results for input(s): "AMMONIA" in the last 168 hours. CBC: Recent Labs  Lab 11/05/23 1023 11/06/23 0504 11/07/23 0515 11/08/23 0515 11/09/23 0426  WBC 6.8 7.0 6.7 6.7 6.7  HGB 13.4 8.5* 9.0* 8.6* 10.1*  HCT 43.3 26.9* 28.9* 27.2* 32.9*  MCV 92.7 93.1 93.2 94.4 95.9  PLT 252 295 288 263 291   Cardiac Enzymes: No results for input(s): "CKTOTAL", "CKMB", "CKMBINDEX", "TROPONINI" in the last 168 hours. BNP: Invalid input(s): "POCBNP" CBG: Recent Labs  Lab 11/08/23 1212 11/08/23 1642 11/08/23 2121 11/09/23 0743 11/09/23 1035  GLUCAP 420* 455* 390* 409* 428*   D-Dimer No results for input(s): "DDIMER" in the last 72 hours. Hgb A1c No results for input(s): "HGBA1C" in the last 72 hours. Lipid Profile Recent Labs    11/09/23 0426  TRIG 1,016*   Thyroid function studies No results for input(s): "TSH", "T4TOTAL", "T3FREE", "THYROIDAB" in the last 72 hours.  Invalid input(s): "FREET3" Anemia work up No results for input(s): "VITAMINB12", "FOLATE", "FERRITIN", "TIBC", "IRON ", "RETICCTPCT" in the last 72 hours. Urinalysis    Component Value  Date/Time   COLORURINE YELLOW 05/30/2021 1243   APPEARANCEUR CLEAR 05/30/2021 1243   APPEARANCEUR Clear 03/10/2014 1305   LABSPEC 1.025 05/30/2021 1243   LABSPEC 1.028 03/10/2014 1305   PHURINE 5.5 05/30/2021 1243   GLUCOSEU 500 (A) 05/30/2021 1243   GLUCOSEU >=500 03/10/2014 1305   HGBUR NEGATIVE 05/30/2021 1243   BILIRUBINUR NEGATIVE 05/30/2021 1243   BILIRUBINUR Negative 03/10/2014 1305   KETONESUR NEGATIVE 05/30/2021 1243   PROTEINUR NEGATIVE 05/30/2021 1243   UROBILINOGEN 1.0 10/12/2009 1213   NITRITE NEGATIVE 05/30/2021 1243   LEUKOCYTESUR NEGATIVE 05/30/2021 1243   LEUKOCYTESUR Negative 03/10/2014 1305   Sepsis Labs Recent Labs  Lab 11/06/23 0504 11/07/23 0515 11/08/23 0515 11/09/23 0426  WBC 7.0 6.7 6.7 6.7   Microbiology No results found for this or any previous visit (from the past 240 hours).   Time coordinating discharge: Over 30 minutes  SIGNED:   Alphonsus Jeans, MD  [facility] 11/09/2023, 11:29 AM Pager   If 7PM-7AM, please contact night-coverage [email or web address]

## 2023-11-09 NOTE — Inpatient Diabetes Management (Signed)
 Inpatient Diabetes Program Recommendations  AACE/ADA: New Consensus Statement on Inpatient Glycemic Control   Target Ranges:  Prepandial:   less than 140 mg/dL      Peak postprandial:   less than 180 mg/dL (1-2 hours)      Critically ill patients:  140 - 180 mg/dL    Latest Reference Range & Units 11/08/23 07:47 11/08/23 12:12 11/08/23 16:42 11/08/23 21:21 11/09/23 07:43  Glucose-Capillary 70 - 99 mg/dL 295 (H) 621 (H) 308 (H) 390 (H) 409 (H)    Latest Reference Range & Units 11/07/23 07:45 11/07/23 11:48 11/07/23 16:54 11/07/23 21:31  Glucose-Capillary 70 - 99 mg/dL 657 (H) 846 (H) 962 (H) 406 (H)   Review of Glycemic Control  Diabetes history: DM2 Outpatient Diabetes medications: Lantus  100 units BID, Humalog  25 units BID Current orders for Inpatient glycemic control: Semglee  75 units BID, Novolog  30 units TID with meals, Novolog  0-20 units TID with meals   Inpatient Diabetes Program Recommendations:    Insulin : Please consider increasing Semglee  to 90 units BID. Please change frequency of Novolog  0-20 units to AC&HS.  Thanks, Beacher Limerick, RN, MSN, CDCES Diabetes Coordinator Inpatient Diabetes Program [phone] (Team Pager from 8am to 5pm)

## 2023-11-09 NOTE — Progress Notes (Signed)
 Discharge criteria met

## 2023-11-09 NOTE — TOC Transition Note (Signed)
 Transition of Care [facility]) - Discharge Note   Patient Details  Name: Martin Riddle MRN: [id number] Date of Birth: 10-01-1970  Transition of Care [facility] CM/SW Contact:  Alexandra Ice, RN Phone Number: 11/09/2023, 12:12 PM   Clinical Narrative:    Patient discharging to [facility]. Discharge summary and orders sent to facility via [facility]. Contacted [facility] spoke with Will, patient is #2 on schedule for pick up. TOC notified Will at [facility] patient's weight 331lbs, and height 6'5", he stated they will be able to accommodate bariatric transport today. Patient going to room 224B, nurse to call report to [phone]. EMS packet printed to nurse station. Notified bedside nurse and MD.    Final next level of care: Skilled Nursing Facility Barriers to Discharge: Barriers Resolved   Patient Goals and CMS Choice Patient states their goals for this hospitalization and ongoing recovery are:: get better to go home CMS Medicare.gov Compare Post Acute Care list provided to:: Patient Choice offered to / list presented to : Patient      Discharge Placement                Patient to be transferred to facility by: [facility] Name of family member notified: N/A Patient and family notified of of transfer: 11/09/23  Discharge Plan and Services Additional resources added to the After Visit Summary for     Discharge Planning Services: CM Consult Post Acute Care Choice:  (Pending PT/OT evaluations.)            DME Agency: NA       HH Arranged: NA          Social Drivers of Health (SDOH) Interventions SDOH Screenings   Food Insecurity: Patient Declined (10/27/2023)  Housing: Patient Declined (10/27/2023)  Transportation Needs: Patient Declined (10/27/2023)  Utilities: Patient Declined (10/27/2023)  Alcohol Screen: Low Risk  (05/08/2017)  Social Connections: Patient Declined (10/27/2023)  Tobacco Use: High Risk (10/26/2023)     Readmission Risk Interventions     No data to  display

## 2023-11-09 NOTE — Plan of Care (Signed)
  Problem: Education: Goal: Ability to describe self-care measures that may prevent or decrease complications (Diabetes Survival Skills Education) will improve Outcome: Progressing   Problem: Coping: Goal: Ability to adjust to condition or change in health will improve Outcome: Progressing   Problem: Fluid Volume: Goal: Ability to maintain a balanced intake and output will improve Outcome: Progressing   Problem: Metabolic: Goal: Ability to maintain appropriate glucose levels will improve Outcome: Progressing   Problem: Nutritional: Goal: Maintenance of adequate nutrition will improve Outcome: Progressing   Problem: Skin Integrity: Goal: Risk for impaired skin integrity will decrease Outcome: Progressing   Problem: Skin Integrity: Goal: Skin integrity will improve Outcome: Progressing   Problem: Activity: Goal: Risk for activity intolerance will decrease Outcome: Progressing   Problem: Pain Managment: Goal: General experience of comfort will improve and/or be controlled Outcome: Progressing   Problem: Safety: Goal: Ability to remain free from injury will improve Outcome: Progressing

## 2023-11-09 NOTE — TOC Progression Note (Addendum)
 Transition of Care [facility]) - Progression Note    Patient Details  Name: Martin Riddle MRN: [id number] Date of Birth: [birth year]  [facility] CM/SW Contact  Alexandra Ice, RN Phone Number: 11/09/2023, 10:09 AM  Clinical Narrative:    Received message from Abran Abrahams at [facility], they are reconsidering patient. They are able to offer bed to patient and can accept this afternoon. TOC contacted [employer] [facility] with Cain Castillo, and Siegfried Dress was changed to [facility]. Auth number, [id number], dates 6/1-11/2023, next review date is 11/12/2023. Notified MD and bedside nurse patient is able to discharge to [facility] today.  Provided Abran Abrahams new auth number.  Met with patient at bedside and provided him update on discharge plan and that he is able to go to [facility].   Expected Discharge Plan:  (TBD) Barriers to Discharge: No Barriers Identified  Expected Discharge Plan and Services   Discharge Planning Services: CM Consult Post Acute Care Choice:  (Pending PT/OT evaluations.) Living arrangements for the past 2 months: Single Family Home Expected Discharge Date: 11/07/23                                     Social Determinants of Health (SDOH) Interventions SDOH Screenings   Food Insecurity: Patient Declined (10/27/2023)  Housing: Patient Declined (10/27/2023)  Transportation Needs: Patient Declined (10/27/2023)  Utilities: Patient Declined (10/27/2023)  Alcohol Screen: Low Risk  (05/08/2017)  Social Connections: Patient Declined (10/27/2023)  Tobacco Use: High Risk (10/26/2023)    Readmission Risk Interventions     No data to display

## 2023-11-19 ENCOUNTER — Other Ambulatory Visit: Payer: Self-pay

## 2023-11-19 ENCOUNTER — Inpatient Hospital Stay
Admission: EM | Admit: 2023-11-19 | Discharge: 2023-11-25 | DRG: 698 | Disposition: A | Discharge status: Skilled Nursing Facility | Attending: Obstetrics and Gynecology | Admitting: Obstetrics and Gynecology

## 2023-11-19 ENCOUNTER — Emergency Department

## 2023-11-19 DIAGNOSIS — Z1624 Resistance to multiple antibiotics: Secondary | ICD-10-CM | POA: Diagnosis present

## 2023-11-19 DIAGNOSIS — Z7902 Long term (current) use of antithrombotics/antiplatelets: Secondary | ICD-10-CM

## 2023-11-19 DIAGNOSIS — N179 Acute kidney failure, unspecified: Secondary | ICD-10-CM | POA: Diagnosis present

## 2023-11-19 DIAGNOSIS — E7800 Pure hypercholesterolemia, unspecified: Secondary | ICD-10-CM | POA: Diagnosis present

## 2023-11-19 DIAGNOSIS — Z89611 Acquired absence of right leg above knee: Secondary | ICD-10-CM

## 2023-11-19 DIAGNOSIS — Z7982 Long term (current) use of aspirin: Secondary | ICD-10-CM

## 2023-11-19 DIAGNOSIS — T83511A Infection and inflammatory reaction due to indwelling urethral catheter, initial encounter: Secondary | ICD-10-CM | POA: Diagnosis not present

## 2023-11-19 DIAGNOSIS — A419 Sepsis, unspecified organism: Secondary | ICD-10-CM | POA: Diagnosis present

## 2023-11-19 DIAGNOSIS — Z794 Long term (current) use of insulin: Secondary | ICD-10-CM

## 2023-11-19 DIAGNOSIS — I70202 Unspecified atherosclerosis of native arteries of extremities, left leg: Secondary | ICD-10-CM | POA: Diagnosis present

## 2023-11-19 DIAGNOSIS — Z885 Allergy status to narcotic agent status: Secondary | ICD-10-CM

## 2023-11-19 DIAGNOSIS — Y731 Therapeutic (nonsurgical) and rehabilitative gastroenterology and urology devices associated with adverse incidents: Secondary | ICD-10-CM | POA: Diagnosis present

## 2023-11-19 DIAGNOSIS — E66813 Obesity, class 3: Secondary | ICD-10-CM | POA: Diagnosis present

## 2023-11-19 DIAGNOSIS — I5022 Chronic systolic (congestive) heart failure: Secondary | ICD-10-CM | POA: Diagnosis present

## 2023-11-19 DIAGNOSIS — Z8249 Family history of ischemic heart disease and other diseases of the circulatory system: Secondary | ICD-10-CM

## 2023-11-19 DIAGNOSIS — E1142 Type 2 diabetes mellitus with diabetic polyneuropathy: Secondary | ICD-10-CM | POA: Diagnosis present

## 2023-11-19 DIAGNOSIS — M7989 Other specified soft tissue disorders: Principal | ICD-10-CM

## 2023-11-19 DIAGNOSIS — J9601 Acute respiratory failure with hypoxia: Secondary | ICD-10-CM | POA: Diagnosis present

## 2023-11-19 DIAGNOSIS — G9341 Metabolic encephalopathy: Secondary | ICD-10-CM | POA: Diagnosis present

## 2023-11-19 DIAGNOSIS — I110 Hypertensive heart disease with heart failure: Secondary | ICD-10-CM | POA: Diagnosis present

## 2023-11-19 DIAGNOSIS — A4151 Sepsis due to Escherichia coli [E. coli]: Secondary | ICD-10-CM | POA: Diagnosis present

## 2023-11-19 DIAGNOSIS — E1151 Type 2 diabetes mellitus with diabetic peripheral angiopathy without gangrene: Secondary | ICD-10-CM | POA: Diagnosis present

## 2023-11-19 DIAGNOSIS — Z6841 Body Mass Index (BMI) 40.0 and over, adult: Secondary | ICD-10-CM

## 2023-11-19 DIAGNOSIS — Z79899 Other long term (current) drug therapy: Secondary | ICD-10-CM

## 2023-11-19 DIAGNOSIS — Z95820 Peripheral vascular angioplasty status with implants and grafts: Secondary | ICD-10-CM

## 2023-11-19 DIAGNOSIS — M79671 Pain in right foot: Secondary | ICD-10-CM | POA: Diagnosis not present

## 2023-11-19 DIAGNOSIS — Z833 Family history of diabetes mellitus: Secondary | ICD-10-CM

## 2023-11-19 DIAGNOSIS — M79662 Pain in left lower leg: Principal | ICD-10-CM

## 2023-11-19 DIAGNOSIS — G4733 Obstructive sleep apnea (adult) (pediatric): Secondary | ICD-10-CM | POA: Diagnosis present

## 2023-11-19 DIAGNOSIS — R6520 Severe sepsis without septic shock: Secondary | ICD-10-CM | POA: Diagnosis present

## 2023-11-19 LAB — BLOOD GAS, VENOUS
Acid-Base Excess: 1.1 mmol/L (ref 0.0–2.0)
Bicarbonate: 27.1 mmol/L (ref 20.0–28.0)
Delivery systems: POSITIVE
FIO2: 60 %
O2 Saturation: 94.3 %
Patient temperature: 37
RATE: 18 {breaths}/min
pCO2, Ven: 49 mmHg (ref 44–60)
pH, Ven: 7.35 (ref 7.25–7.43)
pO2, Ven: 68 mmHg — ABNORMAL HIGH (ref 32–45)

## 2023-11-19 LAB — CBG MONITORING, ED: Glucose-Capillary: 82 mg/dL (ref 70–99)

## 2023-11-19 LAB — COMPREHENSIVE METABOLIC PANEL WITH GFR
ALT: 17 U/L (ref 0–44)
AST: 33 U/L (ref 15–41)
Albumin: 3.1 g/dL — ABNORMAL LOW (ref 3.5–5.0)
Alkaline Phosphatase: 116 U/L (ref 38–126)
Anion gap: 9 (ref 5–15)
BUN: 37 mg/dL — ABNORMAL HIGH (ref 6–20)
CO2: 24 mmol/L (ref 22–32)
Calcium: 8.5 mg/dL — ABNORMAL LOW (ref 8.9–10.3)
Chloride: 96 mmol/L — ABNORMAL LOW (ref 98–111)
Creatinine, Ser: 3.17 mg/dL — ABNORMAL HIGH (ref 0.61–1.24)
GFR, Estimated: 22 mL/min — ABNORMAL LOW (ref >60)
Glucose, Bld: 81 mg/dL (ref 70–99)
Potassium: 5 mmol/L (ref 3.5–5.1)
Sodium: 129 mmol/L — ABNORMAL LOW (ref 135–145)
Total Bilirubin: 0.9 mg/dL (ref 0.0–1.2)
Total Protein: 6.6 g/dL (ref 6.5–8.1)

## 2023-11-19 LAB — CBC WITH DIFFERENTIAL/PLATELET
Abs Immature Granulocytes: 0.07 10*3/uL (ref 0.00–0.07)
Basophils Absolute: 0 10*3/uL (ref 0.0–0.1)
Basophils Relative: 0 %
Eosinophils Absolute: 0 10*3/uL (ref 0.0–0.5)
Eosinophils Relative: 0 %
HCT: 29.1 % — ABNORMAL LOW (ref 39.0–52.0)
Hemoglobin: 9 g/dL — ABNORMAL LOW (ref 13.0–17.0)
Immature Granulocytes: 1 %
Lymphocytes Relative: 12 %
Lymphs Abs: 1 10*3/uL (ref 0.7–4.0)
MCH: 28.8 pg (ref 26.0–34.0)
MCHC: 30.9 g/dL (ref 30.0–36.0)
MCV: 93 fL (ref 80.0–100.0)
Monocytes Absolute: 1.1 10*3/uL — ABNORMAL HIGH (ref 0.1–1.0)
Monocytes Relative: 14 %
Neutro Abs: 5.7 10*3/uL (ref 1.7–7.7)
Neutrophils Relative %: 73 %
Platelets: 216 10*3/uL (ref 150–400)
RBC: 3.13 MIL/uL — ABNORMAL LOW (ref 4.22–5.81)
RDW: 17.2 % — ABNORMAL HIGH (ref 11.5–15.5)
WBC: 7.9 10*3/uL (ref 4.0–10.5)
nRBC: 0.4 % — ABNORMAL HIGH (ref 0.0–0.2)

## 2023-11-19 LAB — PROTIME-INR
INR: 1.3 — ABNORMAL HIGH (ref 0.8–1.2)
Prothrombin Time: 16.7 s — ABNORMAL HIGH (ref 11.4–15.2)

## 2023-11-19 LAB — LACTIC ACID, PLASMA: Lactic Acid, Venous: 1.3 mmol/L (ref 0.5–1.9)

## 2023-11-19 LAB — TROPONIN I (HIGH SENSITIVITY): Troponin I (High Sensitivity): 44 ng/L — ABNORMAL HIGH (ref <18)

## 2023-11-19 MED ORDER — SODIUM CHLORIDE 0.9 % IV SOLN
2.0000 g | Freq: Once | INTRAVENOUS | Status: AC
  Administered 2023-11-19: 2 g via INTRAVENOUS
  Filled 2023-11-19: qty 12.5

## 2023-11-19 MED ORDER — VANCOMYCIN HCL 2000 MG/400ML IV SOLN
2000.0000 mg | Freq: Once | INTRAVENOUS | Status: AC
  Administered 2023-11-20: 2000 mg via INTRAVENOUS
  Filled 2023-11-19: qty 400

## 2023-11-19 MED ORDER — LACTATED RINGERS IV BOLUS
2000.0000 mL | Freq: Once | INTRAVENOUS | Status: AC
  Administered 2023-11-19: 2000 mL via INTRAVENOUS

## 2023-11-19 MED ORDER — LACTATED RINGERS IV BOLUS: 1000.0000 mL | Freq: Once | INTRAVENOUS | Status: DC

## 2023-11-19 NOTE — ED Notes (Signed)
 Multiple failed IV attempts. MD Felipe Horton at bedside to attempt US  IV.

## 2023-11-19 NOTE — ED Provider Notes (Signed)
 [facility] Provider Note    Event Date/Time   First MD Initiated Contact with Patient 11/19/23 2255     (approximate)   History   Code Sepsis   HPI  Martin Riddle is a 53 y.o. male who presents to the ED for evaluation of Code Sepsis   Review of medical DC summary from 10 days ago.  Morbidly obese patient with history of DM, PAD.  Admitted for right foot infection and nonhealing ulcerative wound.  Left leg popliteal stent, right AKA  Patient presents to the ED from a local SNF for evaluation of poor responsiveness after reported fall that occurred earlier today.  This morning he apparently had a fall at his facility, they got him back into his bed and then found him later this evening poorly responsive.    EMS found him febrile 102 F and they provide 1 g of IV Tylenol .  Soft pressures, hypoxic.   Here in the ED, patient reports he does not know what happened.  Did not know he fell.  Denies any pain anywhere.  History is limited by acuity   Physical Exam   Triage Vital Signs: ED Triage Vitals  Encounter Vitals Group     BP --      Girls Systolic BP Percentile --      Girls Diastolic BP Percentile --      Boys Systolic BP Percentile --      Boys Diastolic BP Percentile --      Pulse Rate 11/19/23 2254 95     Resp 11/19/23 2254 (!) 22     Temp 11/19/23 2254 98.6 F (37 C)     Temp Source 11/19/23 2254 Oral     SpO2 11/19/23 2254 (!) 85 %     Weight --      Height 11/19/23 2252 6' 6 (1.981 m)     Head Circumference --      Peak Flow --      Pain Score 11/19/23 2251 0     Pain Loc --      Pain Education --      Exclude from Growth Chart --     Most recent vital signs: Vitals:   11/20/23 0123 11/20/23 0124  BP:    Pulse: 79 80  Resp: (!) 26 (!) 32  Temp:    SpO2: 100% 100%    General: Sleepy but awakens to loud vocal stimulation and answers questions.  Morbidly obese and chronically ill-appearing. CV:  Good peripheral  perfusion.  Resp:  Tachypneic to the 30s.  Recumbent in bed on BiPAP pulling good volumes 400-500 cc. Abd:  No distention.  Nontender MSK:  No deformity noted.  Neuro:  No focal deficits appreciated. Other:  Clean bandage over right AKA stump, I take this down and visualize intact staples without purulence, dehiscence or superimposed infectious features.   ED Results / Procedures / Treatments   Labs (all labs ordered are listed, but only abnormal results are displayed) Labs Reviewed  CBC WITH DIFFERENTIAL/PLATELET - Abnormal; Notable for the following components:      Result Value   RBC 3.13 (*)    Hemoglobin 9.0 (*)    HCT 29.1 (*)    RDW 17.2 (*)    nRBC 0.4 (*)    Monocytes Absolute 1.1 (*)    All other components within normal limits  PROTIME-INR - Abnormal; Notable for the following components:   Prothrombin Time 16.7 (*)  INR 1.3 (*)    All other components within normal limits  BLOOD GAS, VENOUS - Abnormal; Notable for the following components:   pO2, Ven 68 (*)    All other components within normal limits  COMPREHENSIVE METABOLIC PANEL WITH GFR - Abnormal; Notable for the following components:   Sodium 129 (*)    Chloride 96 (*)    BUN 37 (*)    Creatinine, Ser 3.17 (*)    Calcium  8.5 (*)    Albumin  3.1 (*)    GFR, Estimated 22 (*)    All other components within normal limits  TROPONIN I (HIGH SENSITIVITY) - Abnormal; Notable for the following components:   Troponin I (High Sensitivity) 44 (*)    All other components within normal limits  TROPONIN I (HIGH SENSITIVITY) - Abnormal; Notable for the following components:   Troponin I (High Sensitivity) 47 (*)    All other components within normal limits  CULTURE, BLOOD (ROUTINE X 2)  CULTURE, BLOOD (ROUTINE X 2)  URINE CULTURE  LACTIC ACID, PLASMA  LACTIC ACID, PLASMA  BRAIN NATRIURETIC PEPTIDE  PROCALCITONIN  URINALYSIS, W/ REFLEX TO CULTURE (INFECTION SUSPECTED)  CBG MONITORING, ED    EKG Sinus rhythm  with a rate of 89 bpm, normal axis and intervals.  No STEMI  RADIOLOGY 1 view CXR interpreted by me with pulmonary vascular congestion without discrete lobar infiltration  Official radiology report(s): DG Chest Port 1 View Result Date: 11/19/2023 CLINICAL DATA:  Altered mental status, fall, questionable sepsis EXAM: PORTABLE CHEST 1 VIEW COMPARISON:  10/27/2023 FINDINGS: Stable cardiomegaly. Pulmonary vascular congestion. No focal consolidation, pleural effusion, or pneumothorax. No displaced rib fractures. IMPRESSION: Cardiomegaly with pulmonary vascular congestion. Electronically Signed   By: Rozell Cornet M.D.   On: 11/19/2023 23:26    PROCEDURES and INTERVENTIONS:  .1-3 Lead EKG Interpretation  Performed by: Arline Bennett, MD Authorized by: Arline Bennett, MD     Interpretation: normal     ECG rate:  80   ECG rate assessment: normal     Rhythm: sinus rhythm     Ectopy: none     Conduction: normal   .Ultrasound ED Peripheral IV (Provider)  Date/Time: 11/20/2023 1:55 AM  Performed by: Arline Bennett, MD Authorized by: Arline Bennett, MD   Procedure details:    Indications: multiple failed IV attempts and poor IV access     Skin Prep: chlorhexidine  gluconate     Location: right basilic v.   Angiocath:  20 G   Bedside Ultrasound Guided: Yes     Images: not archived     Patient tolerated procedure without complications: Yes     Dressing applied: Yes   .Critical Care  Performed by: Arline Bennett, MD Authorized by: Arline Bennett, MD   Critical care provider statement:    Critical care time (minutes):  30   Critical care time was exclusive of:  Separately billable procedures and treating other patients   Critical care was necessary to treat or prevent imminent or life-threatening deterioration of the following conditions:  Circulatory failure, respiratory failure and sepsis   Critical care was time spent personally by me on the following activities:  Development of treatment plan  with patient or surrogate, discussions with consultants, evaluation of patient's response to treatment, examination of patient, ordering and review of laboratory studies, ordering and review of radiographic studies, ordering and performing treatments and interventions, pulse oximetry, re-evaluation of patient's condition and review of old charts   Medications  lactated ringers  bolus 2,000  mL (0 mLs Intravenous Stopped 11/20/23 0043)  ceFEPIme  (MAXIPIME ) 2 g in sodium chloride  0.9 % 100 mL IVPB (0 g Intravenous Stopped 11/19/23 2329)  vancomycin  (VANCOREADY) IVPB 2000 mg/400 mL (0 mg Intravenous Stopped 11/20/23 0147)     IMPRESSION / MDM / ASSESSMENT AND PLAN / ED COURSE  I reviewed the triage vital signs and the nursing notes.  Differential diagnosis includes, but is not limited to, bacteremia, cellulitis, sepsis, pneumonia, acute volume overload  {Patient presents with symptoms of an acute illness or injury that is potentially life-threatening.  Patient presents with signs of respiratory failure, AKI, sepsis of uncertain etiology requiring medical admission.  Improving oxygenation's on BiPAP, no indication for endotracheal intubation while I have him.  Soft blood pressures improving with IV fluids without indication for vasopressors.  AKI, no significant hematologic derangements.  Elevated procalcitonin, normal lactic acid.  Mild elevation of troponin.  Congested CXR.  Provide broad-spectrum antibiotics, IV fluids.  Consult with ICU and hospitalist for admission.  Clinical Course as of 11/20/23 0155  Thu Nov 19, 2023  2335 Reassessed. USIV placed. 18g right basilic v  [DS]  Fri Nov 20, 2023  0022 Reassessed, maps slowly improving with IV fluids.  Wife at the bedside.  Discussed plan of care and workup so far [DS]  0039 I consult with ICU.  Overnight NP.  She will come down to take a look at the patient [DS]  0130 I consulted hospitalist who agrees to admit [DS]    Clinical Course User  Index [DS] Arline Bennett, MD     FINAL CLINICAL IMPRESSION(S) / ED DIAGNOSES   Final diagnoses:  None     Rx / DC Orders   ED Discharge Orders     None        Note:  This document was prepared using Dragon voice recognition software and may include unintentional dictation errors.   Arline Bennett, MD 11/20/23 0157

## 2023-11-19 NOTE — ED Triage Notes (Addendum)
 BIB [facility] from  [facility]. They called for a fall. Pt found to be more lethargic. Pt had fallen earlier in the day and no transport.  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 Ppt was unresponsive during transport but is now alert and oriented to baseline.  Pt is having intermittent periods of responsiveness.

## 2023-11-20 ENCOUNTER — Inpatient Hospital Stay

## 2023-11-20 DIAGNOSIS — N179 Acute kidney failure, unspecified: Secondary | ICD-10-CM

## 2023-11-20 DIAGNOSIS — I959 Hypotension, unspecified: Secondary | ICD-10-CM

## 2023-11-20 DIAGNOSIS — I110 Hypertensive heart disease with heart failure: Secondary | ICD-10-CM | POA: Diagnosis present

## 2023-11-20 DIAGNOSIS — Z95820 Peripheral vascular angioplasty status with implants and grafts: Secondary | ICD-10-CM | POA: Diagnosis not present

## 2023-11-20 DIAGNOSIS — A419 Sepsis, unspecified organism: Secondary | ICD-10-CM | POA: Diagnosis not present

## 2023-11-20 DIAGNOSIS — Z7902 Long term (current) use of antithrombotics/antiplatelets: Secondary | ICD-10-CM | POA: Diagnosis not present

## 2023-11-20 DIAGNOSIS — Z89611 Acquired absence of right leg above knee: Secondary | ICD-10-CM

## 2023-11-20 DIAGNOSIS — Z7189 Other specified counseling: Secondary | ICD-10-CM | POA: Diagnosis not present

## 2023-11-20 DIAGNOSIS — J9601 Acute respiratory failure with hypoxia: Secondary | ICD-10-CM | POA: Diagnosis present

## 2023-11-20 DIAGNOSIS — Z7982 Long term (current) use of aspirin: Secondary | ICD-10-CM | POA: Diagnosis not present

## 2023-11-20 DIAGNOSIS — Z515 Encounter for palliative care: Secondary | ICD-10-CM | POA: Diagnosis not present

## 2023-11-20 DIAGNOSIS — R509 Fever, unspecified: Secondary | ICD-10-CM | POA: Diagnosis not present

## 2023-11-20 DIAGNOSIS — Z789 Other specified health status: Secondary | ICD-10-CM | POA: Diagnosis not present

## 2023-11-20 DIAGNOSIS — Z794 Long term (current) use of insulin: Secondary | ICD-10-CM | POA: Diagnosis not present

## 2023-11-20 DIAGNOSIS — G4733 Obstructive sleep apnea (adult) (pediatric): Secondary | ICD-10-CM | POA: Diagnosis present

## 2023-11-20 DIAGNOSIS — Z833 Family history of diabetes mellitus: Secondary | ICD-10-CM | POA: Diagnosis not present

## 2023-11-20 DIAGNOSIS — E1142 Type 2 diabetes mellitus with diabetic polyneuropathy: Secondary | ICD-10-CM | POA: Diagnosis present

## 2023-11-20 DIAGNOSIS — G9341 Metabolic encephalopathy: Secondary | ICD-10-CM | POA: Diagnosis present

## 2023-11-20 DIAGNOSIS — M79671 Pain in right foot: Secondary | ICD-10-CM | POA: Diagnosis present

## 2023-11-20 DIAGNOSIS — A4151 Sepsis due to Escherichia coli [E. coli]: Secondary | ICD-10-CM | POA: Diagnosis present

## 2023-11-20 DIAGNOSIS — R6520 Severe sepsis without septic shock: Secondary | ICD-10-CM

## 2023-11-20 DIAGNOSIS — Y731 Therapeutic (nonsurgical) and rehabilitative gastroenterology and urology devices associated with adverse incidents: Secondary | ICD-10-CM | POA: Diagnosis present

## 2023-11-20 DIAGNOSIS — E66813 Obesity, class 3: Secondary | ICD-10-CM | POA: Diagnosis present

## 2023-11-20 DIAGNOSIS — M79662 Pain in left lower leg: Secondary | ICD-10-CM | POA: Diagnosis not present

## 2023-11-20 DIAGNOSIS — Z8249 Family history of ischemic heart disease and other diseases of the circulatory system: Secondary | ICD-10-CM | POA: Diagnosis not present

## 2023-11-20 DIAGNOSIS — T83511A Infection and inflammatory reaction due to indwelling urethral catheter, initial encounter: Secondary | ICD-10-CM | POA: Diagnosis present

## 2023-11-20 DIAGNOSIS — Z1624 Resistance to multiple antibiotics: Secondary | ICD-10-CM | POA: Diagnosis present

## 2023-11-20 DIAGNOSIS — I5022 Chronic systolic (congestive) heart failure: Secondary | ICD-10-CM | POA: Diagnosis present

## 2023-11-20 DIAGNOSIS — Z6841 Body Mass Index (BMI) 40.0 and over, adult: Secondary | ICD-10-CM | POA: Diagnosis not present

## 2023-11-20 DIAGNOSIS — Z885 Allergy status to narcotic agent status: Secondary | ICD-10-CM | POA: Diagnosis not present

## 2023-11-20 DIAGNOSIS — Z79899 Other long term (current) drug therapy: Secondary | ICD-10-CM | POA: Diagnosis not present

## 2023-11-20 DIAGNOSIS — E1151 Type 2 diabetes mellitus with diabetic peripheral angiopathy without gangrene: Secondary | ICD-10-CM | POA: Diagnosis present

## 2023-11-20 DIAGNOSIS — E7800 Pure hypercholesterolemia, unspecified: Secondary | ICD-10-CM | POA: Diagnosis present

## 2023-11-20 LAB — CBC WITH DIFFERENTIAL/PLATELET
Abs Immature Granulocytes: 0.06 10*3/uL (ref 0.00–0.07)
Basophils Absolute: 0 10*3/uL (ref 0.0–0.1)
Basophils Relative: 0 %
Eosinophils Absolute: 0 10*3/uL (ref 0.0–0.5)
Eosinophils Relative: 0 %
HCT: 27.9 % — ABNORMAL LOW (ref 39.0–52.0)
Hemoglobin: 8.5 g/dL — ABNORMAL LOW (ref 13.0–17.0)
Immature Granulocytes: 1 %
Lymphocytes Relative: 14 %
Lymphs Abs: 1 10*3/uL (ref 0.7–4.0)
MCH: 29.1 pg (ref 26.0–34.0)
MCHC: 30.5 g/dL (ref 30.0–36.0)
MCV: 95.5 fL (ref 80.0–100.0)
Monocytes Absolute: 1 10*3/uL (ref 0.1–1.0)
Monocytes Relative: 14 %
Neutro Abs: 5.2 10*3/uL (ref 1.7–7.7)
Neutrophils Relative %: 71 %
Platelets: 201 10*3/uL (ref 150–400)
RBC: 2.92 MIL/uL — ABNORMAL LOW (ref 4.22–5.81)
RDW: 17.4 % — ABNORMAL HIGH (ref 11.5–15.5)
WBC: 7.2 10*3/uL (ref 4.0–10.5)
nRBC: 0 % (ref 0.0–0.2)

## 2023-11-20 LAB — URINALYSIS, COMPLETE (UACMP) WITH MICROSCOPIC
Bilirubin Urine: NEGATIVE
Glucose, UA: NEGATIVE mg/dL
Ketones, ur: NEGATIVE mg/dL
Nitrite: NEGATIVE
Protein, ur: 100 mg/dL — AB
RBC / HPF: >50 RBC/hpf (ref 0–5)
Specific Gravity, Urine: 1.026 (ref 1.005–1.030)
WBC, UA: >50 WBC/hpf (ref 0–5)
pH: 5 (ref 5.0–8.0)

## 2023-11-20 LAB — BLOOD GAS, ARTERIAL
Acid-base deficit: 1.1 mmol/L (ref 0.0–2.0)
Bicarbonate: 24.8 mmol/L (ref 20.0–28.0)
Delivery systems: POSITIVE
Expiratory PAP: 6 cmH2O
FIO2: 0.4 %
Inspiratory PAP: 14 cmH2O
O2 Saturation: 98.4 %
Patient temperature: 37
pCO2 arterial: 46 mmHg (ref 32–48)
pH, Arterial: 7.34 — ABNORMAL LOW (ref 7.35–7.45)
pO2, Arterial: 118 mmHg — ABNORMAL HIGH (ref 83–108)

## 2023-11-20 LAB — GLUCOSE, CAPILLARY
Glucose-Capillary: 97 mg/dL (ref 70–99)
Glucose-Capillary: 200 mg/dL — ABNORMAL HIGH (ref 70–99)
Glucose-Capillary: 275 mg/dL — ABNORMAL HIGH (ref 70–99)

## 2023-11-20 LAB — COMPREHENSIVE METABOLIC PANEL WITH GFR
ALT: 16 U/L (ref 0–44)
AST: 26 U/L (ref 15–41)
Albumin: 3 g/dL — ABNORMAL LOW (ref 3.5–5.0)
Alkaline Phosphatase: 105 U/L (ref 38–126)
Anion gap: 11 (ref 5–15)
BUN: 42 mg/dL — ABNORMAL HIGH (ref 6–20)
CO2: 24 mmol/L (ref 22–32)
Calcium: 8.1 mg/dL — ABNORMAL LOW (ref 8.9–10.3)
Chloride: 95 mmol/L — ABNORMAL LOW (ref 98–111)
Creatinine, Ser: 3.77 mg/dL — ABNORMAL HIGH (ref 0.61–1.24)
GFR, Estimated: 18 mL/min — ABNORMAL LOW (ref >60)
Glucose, Bld: 114 mg/dL — ABNORMAL HIGH (ref 70–99)
Potassium: 4.9 mmol/L (ref 3.5–5.1)
Sodium: 130 mmol/L — ABNORMAL LOW (ref 135–145)
Total Bilirubin: 1.3 mg/dL — ABNORMAL HIGH (ref 0.0–1.2)
Total Protein: 6.9 g/dL (ref 6.5–8.1)

## 2023-11-20 LAB — TROPONIN I (HIGH SENSITIVITY): Troponin I (High Sensitivity): 47 ng/L — ABNORMAL HIGH (ref <18)

## 2023-11-20 LAB — PROTIME-INR
INR: 1.4 — ABNORMAL HIGH (ref 0.8–1.2)
Prothrombin Time: 16.9 s — ABNORMAL HIGH (ref 11.4–15.2)

## 2023-11-20 LAB — CORTISOL-AM, BLOOD: Cortisol - AM: 22.2 ug/dL (ref 6.7–22.6)

## 2023-11-20 LAB — MRSA NEXT GEN BY PCR, NASAL: MRSA by PCR Next Gen: NOT DETECTED

## 2023-11-20 LAB — LACTIC ACID, PLASMA
Lactic Acid, Venous: 1.2 mmol/L (ref 0.5–1.9)
Lactic Acid, Venous: 0.8 mmol/L (ref 0.5–1.9)

## 2023-11-20 LAB — BRAIN NATRIURETIC PEPTIDE: B Natriuretic Peptide: 90.4 pg/mL (ref 0.0–100.0)

## 2023-11-20 LAB — HEPARIN LEVEL (UNFRACTIONATED)
Heparin Unfractionated: 0.14 [IU]/mL — ABNORMAL LOW (ref 0.30–0.70)
Heparin Unfractionated: 0.21 [IU]/mL — ABNORMAL LOW (ref 0.30–0.70)

## 2023-11-20 LAB — PROCALCITONIN: Procalcitonin: 4.29 ng/mL

## 2023-11-20 MED ORDER — HEPARIN BOLUS VIA INFUSION
4000.0000 [IU] | Freq: Once | INTRAVENOUS | Status: AC
  Administered 2023-11-20: 4000 [IU] via INTRAVENOUS
  Filled 2023-11-20: qty 4000

## 2023-11-20 MED ORDER — ONDANSETRON HCL 4 MG PO TABS
4.0000 mg | ORAL_TABLET | Freq: Four times a day (QID) | ORAL | Status: AC | PRN
Start: 2023-11-20 — End: ?

## 2023-11-20 MED ORDER — IPRATROPIUM-ALBUTEROL 0.5-2.5 (3) MG/3ML IN SOLN
3.0000 mL | Freq: Four times a day (QID) | RESPIRATORY_TRACT | Status: DC
  Administered 2023-11-20 – 2023-11-21 (×6): 3 mL via RESPIRATORY_TRACT
  Filled 2023-11-20 (×6): qty 3

## 2023-11-20 MED ORDER — ONDANSETRON HCL 4 MG/2ML IJ SOLN
4.0000 mg | Freq: Four times a day (QID) | INTRAMUSCULAR | Status: DC | PRN
  Administered 2023-11-23: 4 mg via INTRAVENOUS
  Filled 2023-11-20: qty 2

## 2023-11-20 MED ORDER — INSULIN GLARGINE-YFGN 100 UNIT/ML ~~LOC~~ SOLN
90.0000 [IU] | Freq: Two times a day (BID) | SUBCUTANEOUS | Status: DC
  Administered 2023-11-20 – 2023-11-22 (×4): 90 [IU] via SUBCUTANEOUS
  Filled 2023-11-20 (×6): qty 0.9

## 2023-11-20 MED ORDER — ASPIRIN 81 MG PO CHEW
81.0000 mg | CHEWABLE_TABLET | Freq: Every day | ORAL | Status: DC
  Administered 2023-11-21 – 2023-11-25 (×5): 81 mg via ORAL
  Filled 2023-11-20 (×5): qty 1

## 2023-11-20 MED ORDER — VANCOMYCIN HCL 1500 MG/300ML IV SOLN: 1500.0000 mg | INTRAVENOUS | Status: DC

## 2023-11-20 MED ORDER — DULOXETINE HCL 30 MG PO CPEP
60.0000 mg | ORAL_CAPSULE | Freq: Every day | ORAL | Status: DC
  Administered 2023-11-21 – 2023-11-25 (×5): 60 mg via ORAL
  Filled 2023-11-20 (×5): qty 2

## 2023-11-20 MED ORDER — HEPARIN (PORCINE) 25000 UT/250ML-% IV SOLN
3200.0000 [IU]/h | INTRAVENOUS | Status: DC
  Administered 2023-11-20: 2200 [IU]/h via INTRAVENOUS
  Administered 2023-11-21 (×2): 3200 [IU]/h via INTRAVENOUS
  Administered 2023-11-21: 2700 [IU]/h via INTRAVENOUS
  Administered 2023-11-22: 3200 [IU]/h via INTRAVENOUS
  Filled 2023-11-20 (×6): qty 250

## 2023-11-20 MED ORDER — SODIUM CHLORIDE 0.9 % IV BOLUS
1000.0000 mL | Freq: Once | INTRAVENOUS | Status: AC
  Administered 2023-11-20: 1000 mL via INTRAVENOUS

## 2023-11-20 MED ORDER — CHLORHEXIDINE GLUCONATE CLOTH 2 % EX PADS
6.0000 | MEDICATED_PAD | Freq: Every day | CUTANEOUS | Status: DC
  Administered 2023-11-20 – 2023-11-23 (×4): 6 via TOPICAL

## 2023-11-20 MED ORDER — HEPARIN BOLUS VIA INFUSION
5000.0000 [IU] | Freq: Once | INTRAVENOUS | Status: AC
  Administered 2023-11-20: 5000 [IU] via INTRAVENOUS
  Filled 2023-11-20: qty 5000

## 2023-11-20 MED ORDER — LACTATED RINGERS IV SOLN: INTRAVENOUS | Status: AC

## 2023-11-20 MED ORDER — LIDOCAINE-PRILOCAINE 2.5-2.5 % EX CREA
TOPICAL_CREAM | Freq: Once | CUTANEOUS | Status: AC
  Filled 2023-11-20: qty 5

## 2023-11-20 MED ORDER — ACETAMINOPHEN 650 MG RE SUPP: 650.0000 mg | Freq: Four times a day (QID) | RECTAL | Status: DC | PRN

## 2023-11-20 MED ORDER — METHYLPREDNISOLONE SODIUM SUCC 40 MG IJ SOLR
40.0000 mg | Freq: Every day | INTRAMUSCULAR | Status: AC
  Administered 2023-11-20 – 2023-11-24 (×5): 40 mg via INTRAVENOUS
  Filled 2023-11-20 (×5): qty 1

## 2023-11-20 MED ORDER — ACETAMINOPHEN 325 MG PO TABS
650.0000 mg | ORAL_TABLET | Freq: Four times a day (QID) | ORAL | Status: AC | PRN
Start: 2023-11-20 — End: ?
  Administered 2023-11-21 – 2023-11-24 (×4): 650 mg via ORAL
  Filled 2023-11-20 (×5): qty 2

## 2023-11-20 MED ORDER — MIDODRINE HCL 5 MG PO TABS
5.0000 mg | ORAL_TABLET | Freq: Three times a day (TID) | ORAL | Status: DC
  Administered 2023-11-20 – 2023-11-21 (×2): 5 mg via ORAL
  Filled 2023-11-20 (×2): qty 1

## 2023-11-20 MED ORDER — METRONIDAZOLE 500 MG/100ML IV SOLN
500.0000 mg | Freq: Two times a day (BID) | INTRAVENOUS | Status: DC
  Administered 2023-11-20 – 2023-11-22 (×5): 500 mg via INTRAVENOUS
  Filled 2023-11-20 (×7): qty 100

## 2023-11-20 MED ORDER — INSULIN ASPART 100 UNIT/ML IJ SOLN
0.0000 [IU] | Freq: Three times a day (TID) | INTRAMUSCULAR | Status: DC
  Administered 2023-11-20: 1 [IU] via SUBCUTANEOUS
  Administered 2023-11-21: 4 [IU] via SUBCUTANEOUS
  Filled 2023-11-20 (×2): qty 1

## 2023-11-20 MED ORDER — LACTATED RINGERS IV SOLN
150.0000 mL/h | INTRAVENOUS | Status: DC
Start: 2023-11-20 — End: 2023-11-20
  Administered 2023-11-20: 150 mL/h via INTRAVENOUS

## 2023-11-20 MED ORDER — SODIUM CHLORIDE 0.9 % IV SOLN
2.0000 g | Freq: Two times a day (BID) | INTRAVENOUS | Status: DC
  Administered 2023-11-20 – 2023-11-21 (×2): 2 g via INTRAVENOUS
  Filled 2023-11-20 (×3): qty 12.5

## 2023-11-20 MED ORDER — NOREPINEPHRINE 4 MG/250ML-% IV SOLN: 0.0000 ug/min | INTRAVENOUS | Status: DC

## 2023-11-20 MED ORDER — ATORVASTATIN CALCIUM 20 MG PO TABS
20.0000 mg | ORAL_TABLET | Freq: Every day | ORAL | Status: DC
  Administered 2023-11-21 – 2023-11-23 (×3): 20 mg via ORAL
  Filled 2023-11-20 (×3): qty 1

## 2023-11-20 MED ORDER — ALBUTEROL SULFATE (2.5 MG/3ML) 0.083% IN NEBU: 2.5000 mg | INHALATION_SOLUTION | RESPIRATORY_TRACT | Status: DC | PRN

## 2023-11-20 MED ORDER — CLOPIDOGREL BISULFATE 75 MG PO TABS
75.0000 mg | ORAL_TABLET | Freq: Every day | ORAL | Status: DC
  Administered 2023-11-21 – 2023-11-25 (×5): 75 mg via ORAL
  Filled 2023-11-20 (×5): qty 1

## 2023-11-20 MED ORDER — INSULIN ASPART 100 UNIT/ML IJ SOLN
0.0000 [IU] | Freq: Every day | INTRAMUSCULAR | Status: DC
  Administered 2023-11-20: 3 [IU] via SUBCUTANEOUS
  Administered 2023-11-21 – 2023-11-23 (×2): 5 [IU] via SUBCUTANEOUS
  Administered 2023-11-24: 3 [IU] via SUBCUTANEOUS
  Filled 2023-11-20 (×4): qty 1

## 2023-11-20 MED ORDER — HEPARIN SODIUM (PORCINE) 5000 UNIT/ML IJ SOLN
5000.0000 [IU] | Freq: Three times a day (TID) | INTRAMUSCULAR | Status: DC
  Administered 2023-11-20: 5000 [IU] via SUBCUTANEOUS
  Filled 2023-11-20: qty 1

## 2023-11-20 MED ORDER — HALOPERIDOL LACTATE 5 MG/ML IJ SOLN
2.0000 mg | Freq: Once | INTRAMUSCULAR | Status: AC
  Administered 2023-11-20: 2 mg via INTRAVENOUS
  Filled 2023-11-20: qty 1

## 2023-11-20 NOTE — Assessment & Plan Note (Signed)
 Patient was lethargic and confused, attributing to sepsis.  At baseline has normal mentation Neurologic checks with fall and aspiration precautions

## 2023-11-20 NOTE — Progress Notes (Signed)
 Asked by EDP to evaluate patient for ICU admission. Patient presented with main complaint of a fall and lethargy and hypoxia.  Upon arrival to ED Patient with intermittent lethargy, hypotension placed on BIPAP support. Suspected Sepsis, 2 L IVF bolus administered with intermittent improvement in hemodynamics, SpO2 stable on 60% fio2. Due to concerns for respiratory status and possible need for vasopressor support, PCCM consulted to evaluate patient.   Upon bedside assessment, patient is RASS: -1 to +1 on without dyspnea on BIPAP. Wife bedside reports this mentation is baseline when the patient gets too tired. She reports he has OSA and does not wear a CPAP. He gets very sleepy when his oxygen drops too low for a few days and then can look this lethargic.  BP is labile: 113/87 then 42/22, unclear accuracy. Obtained manual BP and while cuff read 66/ 46, manual was 90/54. SpO2 98% on 60% Fio2. Other vitals are stable, not on vasopressor support.   ABG stable with mild hypoxia, no CO2 retention. Lactic acid WNL x 2, BNP WNL, PCT elevated due to AKI.   Would recommend SDU admission as patient is chronically quite ill and is at risk for decompensation. But as mentation and respiratory status appear to be baseline and hemodynamics once captured with manual readings are stable, patient does not meet ICU admission criteria at this time. Please re-consult if needed in the future.       Recardo Canal Rust-Chester, AGACNP-BC Acute Care Nurse Practitioner [facility]    [phone] / [phone] Please see Amion for pager details.

## 2023-11-20 NOTE — Progress Notes (Signed)
 Brief hospitalist update note.  This is a nonbillable note.  Please see same-day H&P for full billable details.    Briefly, this is a 53 year old male with extensive past medical history significant for insulin -dependent diabetes mellitus, hypertension, morbid obesity, PVD status post right AKA on 5/22 recent discharge skilled nursing facility.  Patient was brought back in from a skilled nursing facility with working diagnosis of serous sepsis of unknown source associated with a respiratory failure requiring initiation of NIPPV and severe AKI.  Apparently according to documentation the patient slid off the bed earlier at the SNF was noted to be confused and lethargic.  EMS was called.  Patient was hypoxic down into the 70s on 5 L.  Apparently was febrile with Tmax 102 per EMS for which she received Tylenol .  Supposedly initial blood pressure was markedly low in the ED but did respond to aggressive intravenous fluid resuscitation.  Patient is currently on NIPPV, remains physically in ICU.  PCCM team consulted.  Patient is high risk for further respiratory decompensation and endotracheal intubation.  Plan: Continue stepdown status Continue aggressive oxygen support High risk for further decompensation and endotracheal intubation, will be a high risk intubation Empiric treatment for VTE pending VQ scan Check left lower extremity Doppler Check 2D echocardiogram Broad-spectrum IV antibiotics Scheduled bronchodilators Telemetry Vitals per your protocol  Case discussed with PCCM team.  They will follow as consult.  Margery Sheets MD  No charge

## 2023-11-20 NOTE — Assessment & Plan Note (Signed)
 Sliding scale insulin coverage

## 2023-11-20 NOTE — Assessment & Plan Note (Addendum)
 Patient presents with altered mental status, hypotension fever at SNF but with normal WBC and lactic acid and Pro-Calc 4.28 also has AKI, respiratory failure Source unknown but had recent procedure IV fluid resuscitation Continue broad-spectrum antibiotics for sepsis of unknown source Follow blood cultures Close monitoring in stepdown

## 2023-11-20 NOTE — Progress Notes (Signed)
 Provider at bedside at approximately 1810 and explained to patient need for arterial line placement, patient consented.  At 1830, provider called a time out and arterial line placed successfully by provider.  Initial arterial line pressure presented with a MAP in the 60s however, MAP down in the 50s and providers aware and are awaiting lactic acid results to move forward.  All endorsed to oncoming RN.

## 2023-11-20 NOTE — Plan of Care (Signed)

## 2023-11-20 NOTE — Progress Notes (Signed)
 Order received for MAP goal of 60.
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
## 2023-11-20 NOTE — Assessment & Plan Note (Signed)
 Creatinine 3.17, up from baseline of 0.79 a couple weeks prior Expecting improvement with IV fluid resuscitation Monitor renal function and avoid nephrotoxins

## 2023-11-20 NOTE — Assessment & Plan Note (Signed)
 Complicating factor to overall prognosis and care

## 2023-11-20 NOTE — Plan of Care (Signed)
 Continuing with plan of care.

## 2023-11-20 NOTE — Consult Note (Signed)
 PHARMACY - ANTICOAGULATION CONSULT NOTE  Pharmacy Consult for Heparin  Indication: empiric treatment for VTE pending VQ scan  Allergies  Allergen Reactions   Morphine Other (See Comments)    headache     A HEADACHE AND SWEATING AND LETHARGIC headache  headache, ,  A HEADACHE AND SWEATING AND LETHARGIC headache   Morphine And Codeine Other (See Comments)    headache   Other Other (See Comments)    A HEADACHE AND SWEATING AND LETHARGIC    headache  A HEADACHE AND SWEATING AND LETHARGIC, , headache    Patient Measurements: Height: 6' 6 (198.1 cm) Weight: (!) 167.4 kg (369 lb 0.8 oz) IBW/kg (Calculated) : 91.4 HEPARIN  DW (KG): 130.2  Vital Signs: Temp: 99.5 F (37.5 C) (06/13 0745) Temp Source: Axillary (06/13 0745) BP: 110/43 (06/13 1536) Pulse Rate: 103 (06/13 1536)  Labs: Recent Labs    11/19/23 2257 11/20/23 0034 11/20/23 0442 11/20/23 0453 11/20/23 1516  HGB 9.0*  --   --  8.5*  --   HCT 29.1*  --   --  27.9*  --   PLT 216  --   --  201  --   LABPROT 16.7*  --  16.9*  --   --   INR 1.3*  --  1.4*  --   --   HEPARINUNFRC  --   --   --   --  0.14*  CREATININE 3.17*  --   --  3.77*  --   TROPONINIHS 44* 47*  --   --   --     Estimated Creatinine Clearance: 38.6 mL/min (A) (by C-G formula based on SCr of 3.77 mg/dL (H)).   Medical History: Past Medical History:  Diagnosis Date   Diabetes mellitus without complication (HCC)    Hypercholesteremia    Hypertension    Pancreatitis     Pertinent Medications:  No history of chronic anticoagulation PTA  Assessment: 53 year old male with extensive past medical history significant for insulin -dependent diabetes mellitus, hypertension, morbid obesity, PVD status post right AKA on 5/22 recent discharge skilled nursing facility. Patient was brought back in from a skilled nursing facility with working diagnosis of serous sepsis of unknown source associated with a respiratory failure requiring initiation of NIPPV  and severe AKI. Pharmacy has been consulted to initiate and monitor continuous heparin  infusion due to concern for possible VTE(VQ scan pending)  Baseline labs: INR 1.4, Hgb 8.5, Plts 201  Date Time Results Comments 6/13 1516 HL = 0.14 Subtherapeutic @ 2200 units/hr  Goal of Therapy:  Heparin  level 0.3-0.7 units/ml Monitor platelets by anticoagulation protocol: Yes   Plan:  Heparin  4000 units IV x 1 dose Increase heparin  infusion rate to 2500 units/hr Check HL in 6 hours and daily while on heparin  Continue to monitor H&H and platelets  Norrin Shreffler Rodriguez-Guzman PharmD, BCPS 11/20/2023 4:21 PM

## 2023-11-20 NOTE — Assessment & Plan Note (Signed)
 S/p right AKA 10/29/2023 Wounds appear intact, not infected Consider vascular consult Follow blood cultures

## 2023-11-20 NOTE — Progress Notes (Signed)
 OT Cancellation Note  Patient Details Name: Martin Riddle MRN: [id number] DOB: April 26, 1971   Cancelled Treatment:    Reason Eval/Treat Not Completed: Medical issues which prohibited therapy. Per chart review and conversation with RN, pt currently on Bipap. OT to hold evaluation this date until pt is able to actively participate. OT to re-attempt 6/14.  George Kinder, MS, OTR/L , CBIS ascom [phone]  11/20/23, 10:08 AM

## 2023-11-20 NOTE — H&P (Signed)
 History and Physical    Patient: Martin Riddle OAC:[id number] DOB: July 11, 1970 DOA: 11/19/2023 DOS: the patient was seen and examined on 11/20/2023 PCP: Thomasene Flemings, NP  Patient coming from: Home  Chief Complaint:  Chief Complaint  Patient presents with   Code Sepsis    HPI: Martin Riddle is a 53 y.o. 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  Patient slid off the bed earlier at the SNF and was noted to be confused and lethargic and EMS was called.  Temp with EMS was 102 for which he received IV Tylenol .  In the ED initial BP in the 40s/20s, responding to fluids, improving to 90s over 50s by admission.  Heart rate was in the 80s to 90s and he was tachypneic to the mid 20s.  Hypoxic to 85% requiring 6 L and subsequently BiPAP to maintain sats in the high 90s.  Afebrile. FiO2 to 60% with pH 7.35 and PCO249 VBG on BiPAP WBC, lactic acid 1.2 and procalcitonin 4.28. BNP 90.4. First troponin 47 Creatinine 3.17, up from baseline of 0.79 a couple weeks prior Hemoglobin 9 Which is about his baseline EKG showing sinus at 89 with no ischemic or other changes.   Chest x-ray showed cardiomegaly with pulmonary vascular congestion The ED provider spoke with PCCM who did not think patient was a candidate for ICU and recommended hospitalist admission.  Admission requested.   Following admission, BP again trended down to 70s over 30s.  ICU consult requested for vasopressors      Past Medical History:  Diagnosis Date   Diabetes mellitus without complication (HCC)    Hypercholesteremia    Hypertension    Pancreatitis    Past Surgical History:  Procedure Laterality Date   AMPUTATION Right 10/28/2023   Procedure: AMPUTATION, ABOVE KNEE;  Surgeon:  Jackquelyn Mass, MD;  Location: [facility] ORS;  Service: Vascular;  Laterality: Right;   ANKLE SURGERY Left 2001,2003   BACK SURGERY  2003,2012   LOWER EXTREMITY ANGIOGRAPHY Left 11/05/2023   Procedure: Lower Extremity Angiography;  Surgeon: Celso College, MD;  Location: [facility] INVASIVE CV LAB;  Service: Cardiovascular;  Laterality: Left;   Social History:  reports that he has quit smoking. His smoking use included cigarettes. His smokeless tobacco use includes chew. He reports that he does not drink alcohol and does not use drugs.  Allergies  Allergen Reactions   Morphine Other (See Comments)    headache     A HEADACHE AND SWEATING AND LETHARGIC headache  headache, ,  A HEADACHE AND SWEATING AND LETHARGIC headache   Morphine And Codeine Other (See Comments)    headache   Other Other (See Comments)    A HEADACHE AND SWEATING AND LETHARGIC    headache  A HEADACHE AND SWEATING AND LETHARGIC, , headache    Family History  Problem Relation Age of Onset   Heart disease Mother    Diabetes Mother    Diabetes Sister     Prior to Admission medications   Medication Sig Start Date End Date Taking? Authorizing Provider  acetaminophen  (TYLENOL ) 500 MG tablet Take 1,000 mg by mouth every 6 (six) hours as needed for mild pain or moderate pain.   Yes [provider]  aspirin  81  MG chewable tablet Chew 1 tablet (81 mg total) by mouth daily. 11/09/23 12/09/23 Yes Alphonsus Jeans, MD  atorvastatin  (LIPITOR) 20 MG tablet Take 1 tablet (20 mg total) by mouth daily. 06/02/21  Yes Luna Salinas, MD  busPIRone  (BUSPAR ) 30 MG tablet Take 30 mg by mouth 3 (three) times daily.   Yes [provider]  cetirizine (ZYRTEC) 10 MG tablet Take 10 mg by mouth daily.   Yes [provider]  cloNIDine  (CATAPRES ) 0.2 MG tablet Take 0.2 mg by mouth 3 (three) times daily.   Yes [provider]  clopidogrel  (PLAVIX ) 75 MG tablet Take 1 tablet (75 mg total) by mouth daily. 11/09/23 12/09/23  Yes Alphonsus Jeans, MD  cyanocobalamin  1000 MCG tablet Take 1 tablet (1,000 mcg total) by mouth daily. 11/09/23 12/09/23 Yes Alphonsus Jeans, MD  DULoxetine  (CYMBALTA ) 60 MG capsule Take 60 mg by mouth daily.   Yes [provider]  hydrochlorothiazide  (HYDRODIURIL ) 12.5 MG tablet Take 12.5 mg by mouth daily.   Yes [provider]  insulin  aspart (NOVOLOG ) 100 UNIT/ML injection Inject 30 Units into the skin 3 (three) times daily with meals. 11/07/23  Yes Alphonsus Jeans, MD  insulin  aspart (NOVOLOG ) 100 UNIT/ML injection Inject 0-20 Units into the skin 4 (four) times daily -  before meals and at bedtime. CBG 70 - 120: 0 units CBG 121 - 150: 3 units CBG 151 - 200: 4 units CBG 201 - 250: 7 units CBG 251 - 300: 11 units CBG 301 - 350: 15 units CBG 351 - 400: 20 units 11/09/23  Yes Alphonsus Jeans, MD  insulin  glargine-yfgn (SEMGLEE ) 100 UNIT/ML injection Inject 0.9 mLs (90 Units total) into the skin 2 (two) times daily. 11/09/23  Yes Alphonsus Jeans, MD  losartan  (COZAAR ) 100 MG tablet Take 100 mg by mouth daily. 04/23/22  Yes [provider]  Multiple Vitamin (MULTIVITAMIN WITH MINERALS) TABS tablet Take 1 tablet by mouth daily. 06/02/21  Yes Luna Salinas, MD  Oxycodone  HCl 10 MG TABS Take 10 mg by mouth every 4 (four) hours as needed. 11/11/23 11/23/23 Yes [provider]  polyethylene glycol (MIRALAX  / GLYCOLAX ) packet Take 17 g by mouth daily. 01/15/17  Yes Marquita Situ, MD  pregabalin  (LYRICA ) 200 MG capsule Take 1 capsule (200 mg total) by mouth 2 (two) times daily. 11/09/23 12/09/23 Yes Alphonsus Jeans, MD  rOPINIRole (REQUIP) 1 MG tablet Take 1 mg by mouth at bedtime. 04/11/22  Yes [provider]  iron  polysaccharides (NIFEREX) 150 MG capsule Take 1 capsule (150 mg total) by mouth daily. Patient not taking: Reported on 11/20/2023 11/09/23 12/09/23  Alphonsus Jeans, MD  nitroGLYCERIN  (NITROSTAT ) 0.4 MG SL tablet Place under the  tongue. Patient not taking: Reported on 11/20/2023 10/02/23   [provider]    Physical Exam: Vitals:   11/20/23 0120 11/20/23 0122 11/20/23 0123 11/20/23 0124  BP: (!) 90/54     Pulse:  77 79 80  Resp:  (!) 24 (!) 26 (!) 32  Temp:      TempSrc:      SpO2:  99% 100% 100%  Height:       Physical Exam Vitals and nursing note reviewed.  Constitutional:      General: He is not in acute distress.    Comments: Bipap, tachypnieic  HENT:     Head: Normocephalic and atraumatic.   Cardiovascular:     Rate and Rhythm: Normal rate and regular rhythm.  Heart sounds: Normal heart sounds.  Pulmonary:     Effort: Pulmonary effort is normal. Tachypnea present.     Breath sounds: Wheezing present.  Abdominal:     Palpations: Abdomen is soft.     Tenderness: There is no abdominal tenderness.   Neurological:     Mental Status: Mental status is at baseline. He is lethargic.     Labs on Admission: I have personally reviewed following labs and imaging studies  CBC: Recent Labs  Lab 11/19/23 2257  WBC 7.9  NEUTROABS 5.7  HGB 9.0*  HCT 29.1*  MCV 93.0  PLT 216   Basic Metabolic Panel: Recent Labs  Lab 11/19/23 2257  NA 129*  K 5.0  CL 96*  CO2 24  GLUCOSE 81  BUN 37*  CREATININE 3.17*  CALCIUM  8.5*   GFR: CrCl cannot be calculated (Unknown ideal weight.). Liver Function Tests: Recent Labs  Lab 11/19/23 2257  AST 33  ALT 17  ALKPHOS 116  BILITOT 0.9  PROT 6.6  ALBUMIN  3.1*   No results for input(s): LIPASE, AMYLASE in the last 168 hours. No results for input(s): AMMONIA in the last 168 hours. Coagulation Profile: Recent Labs  Lab 11/19/23 2257  INR 1.3*   Cardiac Enzymes: No results for input(s): CKTOTAL, CKMB, CKMBINDEX, TROPONINI in the last 168 hours. BNP (last 3 results) No results for input(s): PROBNP in the last 8760 hours. HbA1C: No results for input(s): HGBA1C in the last 72 hours. CBG: Recent Labs  Lab  11/19/23 2256  GLUCAP 82   Lipid Profile: No results for input(s): CHOL, HDL, LDLCALC, TRIG, CHOLHDL, LDLDIRECT in the last 72 hours. Thyroid Function Tests: No results for input(s): TSH, T4TOTAL, FREET4, T3FREE, THYROIDAB in the last 72 hours. Anemia Panel: No results for input(s): VITAMINB12, FOLATE, FERRITIN, TIBC, IRON , RETICCTPCT in the last 72 hours. Urine analysis:    Component Value Date/Time   COLORURINE YELLOW 05/30/2021 1243   APPEARANCEUR CLEAR 05/30/2021 1243   APPEARANCEUR Clear 03/10/2014 1305   LABSPEC 1.025 05/30/2021 1243   LABSPEC 1.028 03/10/2014 1305   PHURINE 5.5 05/30/2021 1243   GLUCOSEU 500 (A) 05/30/2021 1243   GLUCOSEU >=500 03/10/2014 1305   HGBUR NEGATIVE 05/30/2021 1243   BILIRUBINUR NEGATIVE 05/30/2021 1243   BILIRUBINUR Negative 03/10/2014 1305   KETONESUR NEGATIVE 05/30/2021 1243   PROTEINUR NEGATIVE 05/30/2021 1243   UROBILINOGEN 1.0 10/12/2009 1213   NITRITE NEGATIVE 05/30/2021 1243   LEUKOCYTESUR NEGATIVE 05/30/2021 1243   LEUKOCYTESUR Negative 03/10/2014 1305    Radiological Exams on Admission: DG Chest Port 1 View Result Date: 11/19/2023 CLINICAL DATA:  Altered mental status, fall, questionable sepsis EXAM: PORTABLE CHEST 1 VIEW COMPARISON:  10/27/2023 FINDINGS: Stable cardiomegaly. Pulmonary vascular congestion. No focal consolidation, pleural effusion, or pneumothorax. No displaced rib fractures. IMPRESSION: Cardiomegaly with pulmonary vascular congestion. Electronically Signed   By: Rozell Cornet M.D.   On: 11/19/2023 23:26   Data Reviewed for HPI: Relevant notes from primary care and specialist visits, past discharge summaries as available in EHR, including Care Everywhere. Prior diagnostic testing as pertinent to current admission diagnoses Updated medications and problem lists for reconciliation ED course, including vitals, labs, imaging, treatment and response to treatment Triage notes, nursing  and pharmacy notes and ED provider's notes Notable results as noted above in HPI      Assessment and Plan: * Severe sepsis [facility]) Patient presents with altered mental status, hypotension fever at SNF but with normal WBC and lactic acid and Pro-Calc 4.28 also has  AKI, respiratory failure Source unknown but had recent procedure IV fluid resuscitation Continue broad-spectrum antibiotics for sepsis of unknown source Follow blood cultures Close monitoring in stepdown  AKI (acute kidney injury) (HCC) Creatinine 3.17, up from baseline of 0.79 a couple weeks prior Expecting improvement with IV fluid resuscitation Monitor renal function and avoid nephrotoxins  Acute respiratory failure with hypoxia (HCC) Chest x-ray showing cardiomegaly with pulmonary vascular congestion but no infiltrate--patient appears clinically dry Given prolonged recent hospitalization, PE among the differential Empiric Lovenox  1 mg/kg pending ability to further evaluate with V/Q or CTA chest to rule out PE(elevated creatinine precludes contrasted study at this time) Continue BiPAP and wean as tolerated  Acute metabolic encephalopathy Patient was lethargic and confused, attributing to sepsis.  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 Inpatient status is judged to be reasonable and necessary in order to provide the required intensity of service to ensure the patient's safety. The patient's presenting symptoms, physical exam findings, and initial radiographic and laboratory data in the context of their chronic comorbidities is felt to place them at high risk for further clinical deterioration. Furthermore, it is not anticipated that the patient will be medically stable for discharge from the hospital within 2 midnights of admission.   * I certify that at the point of admission it is my clinical judgment that the patient will require inpatient hospital care spanning beyond 2 midnights from the point of admission due to high intensity of service, high risk for further deterioration and high frequency of surveillance required.*   CRITICAL CARE Performed by: Lanetta Pion   Total critical care time: 80 minutes  Critical care time was exclusive of separately billable procedures and treating other patients.  Critical care was necessary to treat or prevent imminent or life-threatening deterioration.  Critical care was time spent personally by me on the following activities: development of treatment plan with patient and/or surrogate as well as nursing, discussions with consultants, evaluation of patient's response to treatment, examination of patient, obtaining history from patient or surrogate, ordering and performing treatments and interventions, ordering and review of laboratory studies, ordering and review of radiographic studies, pulse oximetry and re-evaluation of patient's condition.   Author: Lanetta Pion, MD 11/20/2023 1:57 AM  For on call review [email or web address].

## 2023-11-20 NOTE — Assessment & Plan Note (Addendum)
 Chest x-ray showing cardiomegaly with pulmonary vascular congestion but no infiltrate--patient appears clinically dry Given prolonged recent hospitalization, PE among the differential Empiric Lovenox  1 mg/kg pending ability to further evaluate with V/Q or CTA chest to rule out PE(elevated creatinine precludes contrasted study at this time) Continue BiPAP and wean as tolerated

## 2023-11-20 NOTE — Progress Notes (Signed)
 Patient's B/Ps wax and wane be around a MAP of 60, B/P cuff changed along with placements as well.  Provider notified and reaching out to ICU team for potential A-line placement and possible need for vasopressor support.

## 2023-11-20 NOTE — Procedures (Signed)
 Arterial Catheter Insertion Procedure Note  Martin Riddle  [id number]  Oct 14, 1970  Date:11/20/23  Time:6:55 PM    Provider Performing: Delanna Fears    Procedure: Insertion of Arterial Line (40981) with US  guidance (19147)   Indication(s) Blood pressure monitoring and/or need for frequent ABGs  Consent Risks of the procedure as well as the alternatives and risks of each were explained to the patient and/or caregiver.  Consent for the procedure was obtained and is signed in the bedside chart  Anesthesia None   Time Out Verified patient identification, verified procedure, site/side was marked, verified correct patient position, special equipment/implants available, medications/allergies/relevant history reviewed, required imaging and test results available.   Sterile Technique Maximal sterile technique including full sterile barrier drape, hand hygiene, sterile gown, sterile gloves, mask, hair covering, sterile ultrasound probe cover (if used).   Procedure Description Area of catheter insertion was cleaned with chlorhexidine  and draped in sterile fashion. With real-time ultrasound guidance an arterial catheter was placed into the left radial artery.  Appropriate arterial tracings confirmed on monitor.     Complications/Tolerance None; patient tolerated the procedure well.   EBL Minimal   Specimen(s) None    Cherylann Corpus, AGACNP-BC [facility] Prefer epic messenger for cross cover needs If after hours, please call E-link

## 2023-11-20 NOTE — Progress Notes (Signed)
 Pharmacy Antibiotic Note  Martin Riddle is a 53 y.o. male admitted on 11/19/2023 with infection of unknown source.  Pharmacy has been consulted for Vancomycin  dosing for 7 days.  Plan: Pt given Vancomycin  2000 mg once. Vancomycin  1500 mg IV Q 24 hrs. Goal AUC 400-550. Expected AUC: 543.3 SCr used: 3.17, Vd used: 0.5, BMI: 42.6  Pharmacy will continue to follow and will adjust abx dosing whenever warranted.  Temp (24hrs), Avg:99.4 F (37.4 C), Min:98.6 F (37 C), Max:100.5 F (38.1 C)   Recent Labs  Lab 11/19/23 2257 11/20/23 0034  WBC 7.9  --   CREATININE 3.17*  --   LATICACIDVEN 1.3 1.2    Estimated Creatinine Clearance: 45.9 mL/min (A) (by C-G formula based on SCr of 3.17 mg/dL (H)).    Allergies  Allergen Reactions   Morphine Other (See Comments)    headache     A HEADACHE AND SWEATING AND LETHARGIC headache  headache, ,  A HEADACHE AND SWEATING AND LETHARGIC headache   Morphine And Codeine Other (See Comments)    headache   Other Other (See Comments)    A HEADACHE AND SWEATING AND LETHARGIC    headache  A HEADACHE AND SWEATING AND LETHARGIC, , headache    Antimicrobials this admission: 6/12 Cefepime  >> x 7 days 6/13 Vancomycin  >> x 7 days 6/13 Flagyl  >> x 7 days  Microbiology results: 6/12 BCx: Pending 6/12 UCx: Pending   Thank you for allowing pharmacy to be a part of this patient's care.  Coretta Dexter, PharmD, [facility] 11/20/2023 3:39 AM

## 2023-11-20 NOTE — Consult Note (Signed)
 PHARMACY - ANTICOAGULATION CONSULT NOTE  Pharmacy Consult for Heparin  Indication: empiric treatment for VTE pending VQ scan  Allergies  Allergen Reactions   Morphine Other (See Comments)    headache     A HEADACHE AND SWEATING AND LETHARGIC headache  headache, ,  A HEADACHE AND SWEATING AND LETHARGIC headache   Morphine And Codeine Other (See Comments)    headache   Other Other (See Comments)    A HEADACHE AND SWEATING AND LETHARGIC    headache  A HEADACHE AND SWEATING AND LETHARGIC, , headache    Patient Measurements: Height: 6' 6 (198.1 cm) Weight: (!) 167.4 kg (369 lb 0.8 oz) IBW/kg (Calculated) : 91.4 HEPARIN  DW (KG): 130.2  Vital Signs: Temp: 99.5 F (37.5 C) (06/13 0745) Temp Source: Axillary (06/13 0745) BP: 104/80 (06/13 1200) Pulse Rate: 110 (06/13 1400)  Labs: Recent Labs    11/19/23 2257 11/20/23 0034 11/20/23 0442 11/20/23 0453  HGB 9.0*  --   --  8.5*  HCT 29.1*  --   --  27.9*  PLT 216  --   --  201  LABPROT 16.7*  --  16.9*  --   INR 1.3*  --  1.4*  --   CREATININE 3.17*  --   --  3.77*  TROPONINIHS 44* 47*  --   --     Estimated Creatinine Clearance: 38.6 mL/min (A) (by C-G formula based on SCr of 3.77 mg/dL (H)).   Medical History: Past Medical History:  Diagnosis Date   Diabetes mellitus without complication (HCC)    Hypercholesteremia    Hypertension    Pancreatitis     Pertinent Medications:  No history of chronic anticoagulation PTA  Assessment: 53 year old male with extensive past medical history significant for insulin -dependent diabetes mellitus, hypertension, morbid obesity, PVD status post right AKA on 5/22 recent discharge skilled nursing facility. Patient was brought back in from a skilled nursing facility with working diagnosis of serous sepsis of unknown source associated with a respiratory failure requiring initiation of NIPPV and severe AKI. Pharmacy has been consulted to initiate and monitor continuous heparin   infusion due to concern for possible VTE(VQ scan pending)  Baseline labs: INR 1.4, Hgb 8.5, Plts 201  Goal of Therapy:  Heparin  level 0.3-0.7 units/ml Monitor platelets by anticoagulation protocol: Yes   Plan:  Received 5,000 units heparin  subcutaneous this morning, therefore will reduce initial bolus dose Give 5000 units bolus x 1 Start heparin  infusion at 2200 units/hr Check anti-Xa level in 6 hours and daily while on heparin  Continue to monitor H&H and platelets  Zaniya Mcaulay A Micco Bourbeau 11/20/2023,2:16 PM

## 2023-11-20 NOTE — Consult Note (Signed)
 NAME:  Martin Riddle, MRN:  [id number], DOB:  January 06, 1971, LOS: 0 ADMISSION DATE:  11/19/2023  History of Present Illness:  Mr. Martin Riddle is a 53 year old male patient with a past medical history of type 2 diabetes mellitus, PAD status post right AKA in May 2025, hypertension, depression presenting to [facility] on 06/13 from SNF with respiratory failure requiring bipap support.   He underwent R AKA in May 2025 and was discharged to SNF. He was doing well until yesterda where he slidd off his bed and was noted to be confused as well as lethargic.   Febrile to 102. Moderate respiratory distress with hypoxic respiratory failure requiring bipap support. Hypotensive with good response to fluids.    Labs without leukocytosis. AKI noted with Cr 3.7 mg/dl from a baseline of 1 mg/dl. Procal 4.29. BNP 90.4 and Trop 47. LA 1.2.   CXR 11/19/2023 with Cardiomegaly and pulmonary vascular congestion,   This am, he was on bipap and appeared uncomfortable with a MV of 20 l/min,   ABG with a PH of 7.34,  pCO2 46 and PO2 118. Bicarb 24.8.   Pertinent  Medical History  As above  Significant Hospital Events: Including procedures, antibiotic start and stop dates in addition to other pertinent events   11/20/2023 admitted to step down unit on bipap.   Objective    Blood pressure 104/80, pulse (!) 110, temperature 99.5 F (37.5 C), temperature source Axillary, resp. rate (!) 40, height 6' 6 (1.981 m), weight (!) 167.4 kg, SpO2 92%.    FiO2 (%):  [40 %-60 %] 40 %   Intake/Output Summary (Last 24 hours) at 11/20/2023 1450 Last data filed at 11/20/2023 1400 Gross per 24 hour  Intake 4787.86 ml  Output 1000 ml  Net 3787.86 ml   Filed Weights   11/20/23 0254  Weight: (!) 167.4 kg   GEN moderate resp distress on bipap. Able to answer questions coherently.  CVS Normal S1, Normal S2, RRR  Lungs coarse breath sounds bilaterally GI soft nontender nondistended Extremities left lower extremity erythema  concerning for cellulitis  Labs and imaging were reviewed  Assessment and Plan  Mr. Martin Riddle is a 53 year old male patient with a past medical history of type 2 diabetes mellitus, PAD status post right AKA in May 2025, hypertension, depression presenting to [facility] on 06/13 from SNF with respiratory failure requiring bipap support.   Patient presenting with lethargy and confusion was found to be hypotensive with fever up to 102.  Was also found to be in acute hypoxic respiratory failure requiring BiPAP support.  This is likely representing sepsis in the setting of possibly cellulitis with course complicated by possible CHF exacerbation with pulmonary edema as demonstrated by his chest x-ray and worsened by his acute renal failure.  Furthermore, he recently underwent major surgery with right AKA and is at high risk for pulmonary embolism.  However with his acute renal failure CTA seems unreasonable.  I agree with empiric heparinization.  Optimally we should obtain VQ scan.  Agree with lower extremity ultrasound.  Finally, there might be component of bronchoconstriction given high minute ventilation and elevated pCO2 at 46.  Recommend DuoNebs every 6 hours scheduled and prednisone 40 mg for 5 days. Alternate HFNC and Bipap for an SpO2 88 to 92% and a PH > 7.2.  He currently does not meet criteria for ICU level of care. Please do not hesitate to reach back out with any concerns.   I spent 80 minutes caring for this  patient today, including preparing to see the patient, obtaining a medical history , reviewing a separately obtained history, performing a medically appropriate examination and/or evaluation, counseling and educating the patient/family/caregiver, documenting clinical information in the electronic health record, and independently interpreting results (not separately reported/billed) and communicating results to the patient/family/caregiver  Annitta Kindler, MD [facility] 11/20/2023 2:53 PM

## 2023-11-21 ENCOUNTER — Inpatient Hospital Stay
Admit: 2023-11-21 | Discharge: 2023-11-21 | Disposition: A | Discharge status: Home / Self Care | Attending: Internal Medicine | Admitting: Internal Medicine

## 2023-11-21 DIAGNOSIS — M7989 Other specified soft tissue disorders: Secondary | ICD-10-CM

## 2023-11-21 DIAGNOSIS — Z789 Other specified health status: Secondary | ICD-10-CM

## 2023-11-21 DIAGNOSIS — M79662 Pain in left lower leg: Secondary | ICD-10-CM

## 2023-11-21 DIAGNOSIS — Z7189 Other specified counseling: Secondary | ICD-10-CM

## 2023-11-21 DIAGNOSIS — Z515 Encounter for palliative care: Secondary | ICD-10-CM | POA: Diagnosis not present

## 2023-11-21 DIAGNOSIS — R6520 Severe sepsis without septic shock: Secondary | ICD-10-CM | POA: Diagnosis not present

## 2023-11-21 DIAGNOSIS — A419 Sepsis, unspecified organism: Secondary | ICD-10-CM | POA: Diagnosis not present

## 2023-11-21 LAB — CBC
HCT: 29.5 % — ABNORMAL LOW (ref 39.0–52.0)
Hemoglobin: 9 g/dL — ABNORMAL LOW (ref 13.0–17.0)
MCH: 28.5 pg (ref 26.0–34.0)
MCHC: 30.5 g/dL (ref 30.0–36.0)
MCV: 93.4 fL (ref 80.0–100.0)
Platelets: 152 10*3/uL (ref 150–400)
RBC: 3.16 MIL/uL — ABNORMAL LOW (ref 4.22–5.81)
RDW: 16.4 % — ABNORMAL HIGH (ref 11.5–15.5)
WBC: 5.1 10*3/uL (ref 4.0–10.5)
nRBC: 0 % (ref 0.0–0.2)

## 2023-11-21 LAB — BASIC METABOLIC PANEL WITH GFR
Anion gap: 13 (ref 5–15)
BUN: 44 mg/dL — ABNORMAL HIGH (ref 6–20)
CO2: 21 mmol/L — ABNORMAL LOW (ref 22–32)
Calcium: 8.9 mg/dL (ref 8.9–10.3)
Chloride: 100 mmol/L (ref 98–111)
Creatinine, Ser: 1.35 mg/dL — ABNORMAL HIGH (ref 0.61–1.24)
GFR, Estimated: >60 mL/min (ref >60)
Glucose, Bld: 311 mg/dL — ABNORMAL HIGH (ref 70–99)
Potassium: 5.9 mmol/L — ABNORMAL HIGH (ref 3.5–5.1)
Sodium: 134 mmol/L — ABNORMAL LOW (ref 135–145)

## 2023-11-21 LAB — HEPARIN LEVEL (UNFRACTIONATED)
Heparin Unfractionated: 0.13 [IU]/mL — ABNORMAL LOW (ref 0.30–0.70)
Heparin Unfractionated: 0.31 [IU]/mL (ref 0.30–0.70)
Heparin Unfractionated: 0.34 [IU]/mL (ref 0.30–0.70)

## 2023-11-21 LAB — ECHOCARDIOGRAM COMPLETE
AR max vel: 2.8 cm2
AV Peak grad: 10.8 mmHg
Ao pk vel: 1.64 m/s
Area-P 1/2: 3.54 cm2
Calc EF: 59.2 %
Height: 78 in
S' Lateral: 3.8 cm
Single Plane A2C EF: 62.9 %
Single Plane A4C EF: 53.4 %
Weight: 5904.8 [oz_av]

## 2023-11-21 LAB — GLUCOSE, CAPILLARY
Glucose-Capillary: 307 mg/dL — ABNORMAL HIGH (ref 70–99)
Glucose-Capillary: 371 mg/dL — ABNORMAL HIGH (ref 70–99)
Glucose-Capillary: 466 mg/dL — ABNORMAL HIGH (ref 70–99)
Glucose-Capillary: 353 mg/dL — ABNORMAL HIGH (ref 70–99)
Glucose-Capillary: 390 mg/dL — ABNORMAL HIGH (ref 70–99)

## 2023-11-21 LAB — POTASSIUM: Potassium: 4.9 mmol/L (ref 3.5–5.1)

## 2023-11-21 MED ORDER — INSULIN ASPART 100 UNIT/ML IV SOLN
10.0000 [IU] | Freq: Once | INTRAVENOUS | Status: AC
  Administered 2023-11-21: 10 [IU] via INTRAVENOUS
  Filled 2023-11-21: qty 0.1

## 2023-11-21 MED ORDER — INSULIN ASPART 100 UNIT/ML IJ SOLN
0.0000 [IU] | Freq: Three times a day (TID) | INTRAMUSCULAR | Status: DC
  Administered 2023-11-21 (×2): 20 [IU] via SUBCUTANEOUS
  Administered 2023-11-22: 7 [IU] via SUBCUTANEOUS
  Administered 2023-11-22: 4 [IU] via SUBCUTANEOUS
  Administered 2023-11-22 – 2023-11-23 (×2): 3 [IU] via SUBCUTANEOUS
  Administered 2023-11-23: 11 [IU] via SUBCUTANEOUS
  Administered 2023-11-23: 4 [IU] via SUBCUTANEOUS
  Administered 2023-11-24: 15 [IU] via SUBCUTANEOUS
  Administered 2023-11-24: 4 [IU] via SUBCUTANEOUS
  Administered 2023-11-24: 7 [IU] via SUBCUTANEOUS
  Administered 2023-11-25: 4 [IU] via SUBCUTANEOUS
  Filled 2023-11-21 (×11): qty 1

## 2023-11-21 MED ORDER — HEPARIN BOLUS VIA INFUSION
2000.0000 [IU] | Freq: Once | INTRAVENOUS | Status: AC
  Administered 2023-11-21: 2000 [IU] via INTRAVENOUS
  Filled 2023-11-21: qty 2000

## 2023-11-21 MED ORDER — PERFLUTREN LIPID MICROSPHERE
1.0000 mL | INTRAVENOUS | Status: AC | PRN
  Administered 2023-11-21: 5 mL via INTRAVENOUS

## 2023-11-21 MED ORDER — OXYCODONE HCL 5 MG PO TABS
5.0000 mg | ORAL_TABLET | ORAL | Status: DC | PRN
  Administered 2023-11-21 – 2023-11-25 (×13): 10 mg via ORAL
  Filled 2023-11-21 (×14): qty 2

## 2023-11-21 MED ORDER — SODIUM CHLORIDE 0.9 % IV SOLN
2.0000 g | Freq: Three times a day (TID) | INTRAVENOUS | Status: DC
  Administered 2023-11-21 – 2023-11-23 (×7): 2 g via INTRAVENOUS
  Filled 2023-11-21 (×8): qty 12.5

## 2023-11-21 MED ORDER — DEXTROSE 50 % IV SOLN
50.0000 mL | Freq: Once | INTRAVENOUS | Status: AC
  Administered 2023-11-21: 50 mL via INTRAVENOUS
  Filled 2023-11-21: qty 50

## 2023-11-21 MED ORDER — INSULIN ASPART 100 UNIT/ML IJ SOLN: 0.0000 [IU] | Freq: Three times a day (TID) | INTRAMUSCULAR | Status: DC

## 2023-11-21 MED ORDER — HEPARIN BOLUS VIA INFUSION
4000.0000 [IU] | Freq: Once | INTRAVENOUS | Status: AC
  Administered 2023-11-21: 4000 [IU] via INTRAVENOUS
  Filled 2023-11-21: qty 4000

## 2023-11-21 MED ORDER — FENTANYL CITRATE PF 50 MCG/ML IJ SOSY
25.0000 ug | PREFILLED_SYRINGE | INTRAMUSCULAR | Status: DC | PRN
  Administered 2023-11-21 (×3): 25 ug via INTRAVENOUS
  Filled 2023-11-21 (×3): qty 1

## 2023-11-21 MED ORDER — METHOCARBAMOL 500 MG PO TABS
500.0000 mg | ORAL_TABLET | Freq: Three times a day (TID) | ORAL | Status: DC
  Administered 2023-11-21 – 2023-11-25 (×12): 500 mg via ORAL
  Filled 2023-11-21 (×14): qty 1

## 2023-11-21 MED ORDER — HYDROMORPHONE HCL 1 MG/ML IJ SOLN
0.5000 mg | INTRAMUSCULAR | Status: DC | PRN
  Administered 2023-11-21 – 2023-11-24 (×5): 0.5 mg via INTRAVENOUS
  Filled 2023-11-21: qty 1
  Filled 2023-11-21 (×2): qty 0.5
  Filled 2023-11-21: qty 1
  Filled 2023-11-21 (×2): qty 0.5

## 2023-11-21 MED ORDER — IPRATROPIUM-ALBUTEROL 0.5-2.5 (3) MG/3ML IN SOLN
3.0000 mL | Freq: Three times a day (TID) | RESPIRATORY_TRACT | Status: DC
  Administered 2023-11-22 (×2): 3 mL via RESPIRATORY_TRACT
  Filled 2023-11-21 (×2): qty 3

## 2023-11-21 MED ORDER — INSULIN ASPART 100 UNIT/ML IJ SOLN
10.0000 [IU] | Freq: Three times a day (TID) | INTRAMUSCULAR | Status: DC
  Administered 2023-11-22 – 2023-11-25 (×9): 10 [IU] via SUBCUTANEOUS
  Filled 2023-11-21 (×10): qty 1

## 2023-11-21 NOTE — Consult Note (Signed)
 PHARMACY - ANTICOAGULATION CONSULT NOTE  Pharmacy Consult for Heparin  Indication: empiric treatment for VTE pending VQ scan  Allergies  Allergen Reactions   Morphine Other (See Comments)    headache     A HEADACHE AND SWEATING AND LETHARGIC headache  headache, ,  A HEADACHE AND SWEATING AND LETHARGIC headache   Morphine And Codeine Other (See Comments)    headache   Other Other (See Comments)    A HEADACHE AND SWEATING AND LETHARGIC    headache  A HEADACHE AND SWEATING AND LETHARGIC, , headache    Patient Measurements: Height: 6' 6 (198.1 cm) Weight: (!) 167.4 kg (369 lb 0.8 oz) IBW/kg (Calculated) : 91.4 HEPARIN  DW (KG): 130.2  Vital Signs: Temp: 97.6 F (36.4 C) (06/14 0745) Temp Source: Axillary (06/14 0400) BP: 123/67 (06/14 0600) Pulse Rate: 70 (06/14 0600)  Labs: Recent Labs    11/19/23 2257 11/20/23 0034 11/20/23 0442 11/20/23 0453 11/20/23 1516 11/20/23 2331 11/21/23 0448 11/21/23 0842  HGB 9.0*  --   --  8.5*  --   --  9.0*  --   HCT 29.1*  --   --  27.9*  --   --  29.5*  --   PLT 216  --   --  201  --   --  152  --   LABPROT 16.7*  --  16.9*  --   --   --   --   --   INR 1.3*  --  1.4*  --   --   --   --   --   HEPARINUNFRC  --   --   --   --  0.14* 0.21*  --  0.13*  CREATININE 3.17*  --   --  3.77*  --   --  1.35*  --   TROPONINIHS 44* 47*  --   --   --   --   --   --     Estimated Creatinine Clearance: 107.8 mL/min (A) (by C-G formula based on SCr of 1.35 mg/dL (H)).   Medical History: Past Medical History:  Diagnosis Date   Diabetes mellitus without complication (HCC)    Hypercholesteremia    Hypertension    Pancreatitis     Pertinent Medications:  No history of chronic anticoagulation PTA  Assessment: 53 year old male with extensive past medical history significant for insulin -dependent diabetes mellitus, hypertension, morbid obesity, PVD status post right AKA on 5/22 recent discharge skilled nursing facility. Patient was brought  back in from a skilled nursing facility with working diagnosis of serous sepsis of unknown source associated with a respiratory failure requiring initiation of NIPPV and severe AKI. Pharmacy has been consulted to initiate and monitor continuous heparin  infusion due to concern for possible VTE(VQ scan pending)  Baseline labs: INR 1.4, Hgb 8.5, Plts 201  Date Time Results Comments 6/13 1516 HL = 0.14 Subtherapeutic  6/13 2331 HL 0.21 Subtherapeutic  Goal of Therapy:  Heparin  level 0.3-0.7 units/ml Monitor platelets by anticoagulation protocol: Yes   Plan: heparin  level subtherapeutic Heparin  4000 units IV x 1 dose Increase heparin  infusion rate to 3200 units/hr Recheck HL in 6 hours after rate change Continue to monitor H&H and platelets  Barney Boozer, PharmD, BCPS 11/21/2023 9:45 AM

## 2023-11-21 NOTE — Consult Note (Signed)
 PHARMACY - ANTICOAGULATION CONSULT NOTE  Pharmacy Consult for Heparin  Indication: empiric treatment for VTE pending VQ scan  Allergies  Allergen Reactions   Morphine Other (See Comments)    headache     A HEADACHE AND SWEATING AND LETHARGIC headache  headache, ,  A HEADACHE AND SWEATING AND LETHARGIC headache   Morphine And Codeine Other (See Comments)    headache   Other Other (See Comments)    A HEADACHE AND SWEATING AND LETHARGIC    headache  A HEADACHE AND SWEATING AND LETHARGIC, , headache    Patient Measurements: Height: 6' 6 (198.1 cm) Weight: (!) 167.4 kg (369 lb 0.8 oz) IBW/kg (Calculated) : 91.4 HEPARIN  DW (KG): 130.2  Vital Signs: Temp: 97.7 F (36.5 C) (06/14 1724) Temp Source: Oral (06/14 1724) Pulse Rate: 75 (06/14 1724)  Labs: Recent Labs    11/19/23 2257 11/20/23 0034 11/20/23 0442 11/20/23 0453 11/20/23 1516 11/20/23 2331 11/21/23 0448 11/21/23 0842 11/21/23 1700  HGB 9.0*  --   --  8.5*  --   --  9.0*  --   --   HCT 29.1*  --   --  27.9*  --   --  29.5*  --   --   PLT 216  --   --  201  --   --  152  --   --   LABPROT 16.7*  --  16.9*  --   --   --   --   --   --   INR 1.3*  --  1.4*  --   --   --   --   --   --   HEPARINUNFRC  --   --   --   --    < > 0.21*  --  0.13* 0.31  CREATININE 3.17*  --   --  3.77*  --   --  1.35*  --   --   TROPONINIHS 44* 47*  --   --   --   --   --   --   --    < > = values in this interval not displayed.    Estimated Creatinine Clearance: 107.8 mL/min (A) (by C-G formula based on SCr of 1.35 mg/dL (H)).   Medical History: Past Medical History:  Diagnosis Date   Diabetes mellitus without complication (HCC)    Hypercholesteremia    Hypertension    Pancreatitis     Pertinent Medications:  No history of chronic anticoagulation PTA  Assessment: 53 year old male with extensive past medical history significant for insulin -dependent diabetes mellitus, hypertension, morbid obesity, PVD status post right  AKA on 5/22 recent discharge skilled nursing facility. Patient was brought back in from a skilled nursing facility with working diagnosis of serous sepsis of unknown source associated with a respiratory failure requiring initiation of NIPPV and severe AKI. Pharmacy has been consulted to initiate and monitor continuous heparin  infusion due to concern for possible VTE(VQ scan pending)  Baseline labs: INR 1.4, Hgb 8.5, Plts 201  Date Time Results Comments 6/13 1516 HL = 0.14 Subtherapeutic  6/13 2331 HL 0.21 Subtherapeutic 6/14 1700 HL = 0.31 Therapeutic x1  Goal of Therapy:  Heparin  level 0.3-0.7 units/ml Monitor platelets by anticoagulation protocol: Yes   Plan:  Continue heparin  infusion at 3200 units/hr Recheck HL in 6 hours to confirm Continue to monitor H&H and platelets  Thank you for involving pharmacy in this patient's care.   Ananias Balls, PharmD Clinical Pharmacist 11/21/2023 6:01 PM

## 2023-11-21 NOTE — Plan of Care (Signed)
 The patient remains on AR-ICU/SDU as of time of writing. The patient is AA+Ox4. The patient is requiring 7 L/min of supplemental O2 via HFNC. NSR per my measurement of telemetry strip. The patient is a left radial arterial line; dressing changed overnight by this RN. PIVs X 2 also in place. The patient is receiving a continuous infusion of IV Heparin  at 3200 units/hr. ACHS CBG checks will persistent and significantly elevated glucose levels; inpatient diabetes team is following. PT/OT following. The patient has an indwelling urinary catheter. The patient is not receiving the ordered norepinephrine  (parameters not met) nor has he yet required pressors throughout his stay. The patient is ordered to receive IV cefepime  and flagyl  overnight. CHG bath completed overnight.   Problem: Education: Goal: Knowledge of General Education information will improve Description: Including pain rating scale, medication(s)/side effects and non-pharmacologic comfort measures Outcome: Progressing   Problem: Health Behavior/Discharge Planning: Goal: Ability to manage health-related needs will improve Outcome: Progressing   Problem: Clinical Measurements: Goal: Ability to maintain clinical measurements within normal limits will improve Outcome: Progressing Goal: Will remain free from infection Outcome: Progressing Goal: Diagnostic test results will improve Outcome: Progressing Goal: Respiratory complications will improve Outcome: Progressing Goal: Cardiovascular complication will be avoided Outcome: Progressing   Problem: Activity: Goal: Risk for activity intolerance will decrease Outcome: Progressing   Problem: Nutrition: Goal: Adequate nutrition will be maintained Outcome: Progressing   Problem: Coping: Goal: Level of anxiety will decrease Outcome: Progressing   Problem: Elimination: Goal: Will not experience complications related to bowel motility Outcome: Progressing Goal: Will not experience  complications related to urinary retention Outcome: Progressing   Problem: Pain Managment: Goal: General experience of comfort will improve and/or be controlled Outcome: Progressing   Problem: Safety: Goal: Ability to remain free from injury will improve Outcome: Progressing   Problem: Skin Integrity: Goal: Risk for impaired skin integrity will decrease Outcome: Progressing   Problem: Education: Goal: Ability to describe self-care measures that may prevent or decrease complications (Diabetes Survival Skills Education) will improve Outcome: Progressing Goal: Individualized Educational Video(s) Outcome: Progressing   Problem: Coping: Goal: Ability to adjust to condition or change in health will improve Outcome: Progressing   Problem: Fluid Volume: Goal: Ability to maintain a balanced intake and output will improve Outcome: Progressing   Problem: Health Behavior/Discharge Planning: Goal: Ability to identify and utilize available resources and services will improve Outcome: Progressing Goal: Ability to manage health-related needs will improve Outcome: Progressing   Problem: Metabolic: Goal: Ability to maintain appropriate glucose levels will improve Outcome: Progressing   Problem: Nutritional: Goal: Maintenance of adequate nutrition will improve Outcome: Progressing Goal: Progress toward achieving an optimal weight will improve Outcome: Progressing   Problem: Skin Integrity: Goal: Risk for impaired skin integrity will decrease Outcome: Progressing   Problem: Tissue Perfusion: Goal: Adequacy of tissue perfusion will improve Outcome: Progressing   Problem: Fluid Volume: Goal: Hemodynamic stability will improve Outcome: Progressing   Problem: Clinical Measurements: Goal: Diagnostic test results will improve Outcome: Progressing Goal: Signs and symptoms of infection will decrease Outcome: Progressing   Problem: Respiratory: Goal: Ability to maintain adequate  ventilation will improve Outcome: Progressing

## 2023-11-21 NOTE — Progress Notes (Signed)
 PROGRESS NOTE    Martin Riddle  ZOX:[id number] DOB: [birth year] DOA: 11/19/2023 PCP: Thomasene Flemings, NP    Brief Narrative:  53 year old male with extensive past medical history significant for insulin -dependent diabetes mellitus, hypertension, morbid obesity, PVD status post right AKA on 5/22 recent discharge skilled nursing facility.  Patient was brought back in from a skilled nursing facility with working diagnosis of serous sepsis of unknown source associated with a respiratory failure requiring initiation of NIPPV and severe AKI.  Apparently according to documentation the patient slid off the bed earlier at the SNF was noted to be confused and lethargic.  EMS was called.  Patient was hypoxic down into the 70s on 5 L.  Apparently was febrile with Tmax 102 per EMS for which she received Tylenol .  Supposedly initial blood pressure was markedly low in the ED but did respond to aggressive intravenous fluid resuscitation.   Patient is currently on NIPPV, remains physically in ICU.  PCCM team consulted.  Patient is high risk for further respiratory decompensation and endotracheal intubation.  6/14: Looks much better today.  Awake and alert.  Answers questions appropriately.  Kidney function improved.   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  Possible contribution from heart failure but BNP is negative.  Difficult to assess volume status given body habitus.  Given recent AKA pulmonary embolism is high on  differential.  Left lower extremity duplex is negative for VTE.  We were unable to pursue CTA or VQ scan yesterday given hemodynamic instability. Plan: Continue empiric heparinization Repeat kidney function in a.m.  If stable we will pursue CTA.  If not we will pursue VQ scan Steroids Scheduled nebulizers 2D echocardiogram, completed and pending Continue stepdown status Wean oxygen as tolerated  Severe sepsis Altered mental status, hypotension, fever at skilled nursing.  Interestingly elevated procalcitonin with normal white blood cell count, lactic acid normal.  Source unknown.  Did have recent procedure.  Urine definitely infected. Plan: Continue broad-spectrum IV antibiotics Continue IV fluid resuscitation Continue stepdown status for now MAP goal 60 Low threshold to initiate vasopressor support  Acute kidney injury Chronic urinary retention Urinary tract infection Baseline kidney function appears normal 0 .79 Reached a maximum of 3.77, currently downtrending Plan: Avoid nephrotoxins Daily renal parameters Continue Foley Continue empiric antibiotics  Acute metabolic encephalopathy Lethargic and confused.  Multifactorial related to hypoxia and sepsis Mental status much improved  PAD Status post right AKA 5/22 Wound evaluated.  Appears intact and not infected.  Likely no indication for vascular consult but will consider.  Follow blood cultures, no growth to date.  Type 2 diabetes mellitus with peripheral neuropathy Basal bolus regimen Sliding scale coverage Carb modified diet  Class III obesity Complicates overall care and prognosis  DVT prophylaxis: IV hep Code Status: Full Family Communication:Wife at bedside 6/13 Disposition Plan: Status is: Inpatient Remains inpatient appropriate because: Multiple acute issues as above   Level of care: Stepdown  Consultants:  PCCM Palliative care  Procedures:  Non  Antimicrobials: Vancomycin  Cefepime   Flagyl      Subjective: Seen and examined.  Much more awake and alert this morning.  Wants to get up and out of bed.  Objective: Vitals:   11/21/23 1030 11/21/23 1100 11/21/23 1130 11/21/23 1200  BP:      Pulse: 83 79 82   Resp: 18 16 13    Temp:    97.6 F (36.4 C)  TempSrc:    Oral  SpO2: 94% 92% 95%   Weight:      Height:        Intake/Output Summary (Last 24 hours) at 11/21/2023 1209 Last data filed at 11/21/2023 1200 Gross per 24 hour  Intake 1802.63 ml  Output 4285 ml  Net -2482.37 ml   Filed Weights   11/20/23 0254  Weight: (!) 167.4 kg    Examination:  General exam: NAD.  Appears chronically ill Respiratory system: Crackles bilaterally.  Normal work of breathing.  40 L Cardiovascular system: S1-S2, RRR, no murmurs, no pedal edema Gastrointestinal system:, Soft, NT/ND, normal bowel sounds GU: Foley Central nervous system: Alert and oriented. No focal neurological deficits. Extremities: Left lower extremity AKA Skin: No rashes, lesions or ulcers Psychiatry: Judgement and insight appear normal. Mood & affect appropriate.     Data Reviewed: I have personally reviewed following labs and imaging studies  CBC: Recent Labs  Lab 11/19/23 2257 11/20/23 0453 11/21/23 0448  WBC 7.9 7.2 5.1  NEUTROABS 5.7 5.2  --   HGB 9.0* 8.5* 9.0*  HCT 29.1* 27.9* 29.5*  MCV 93.0 95.5 93.4  PLT 216 201 152   Basic Metabolic Panel: Recent Labs  Lab 11/19/23 2257 11/20/23 0453 11/21/23 0448 11/21/23 1047  NA 129* 130* 134*  --   K 5.0 4.9 5.9* 4.9  CL 96* 95* 100  --   CO2 24 24 21*  --   GLUCOSE 81 114* 311*  --   BUN 37* 42* 44*  --   CREATININE 3.17* 3.77* 1.35*  --   CALCIUM  8.5* 8.1* 8.9  --    GFR: Estimated Creatinine Clearance: 107.8 mL/min (A) (by C-G formula based on SCr of 1.35 mg/dL (H)). Liver Function Tests: Recent Labs  Lab 11/19/23 2257 11/20/23 0453  AST 33 26  ALT 17 16  ALKPHOS 116 105  BILITOT 0.9 1.3*  PROT 6.6 6.9  ALBUMIN  3.1* 3.0*   No  results for input(s): LIPASE, AMYLASE in the last 168 hours. No results for input(s): AMMONIA in the last 168 hours. Coagulation Profile: Recent Labs  Lab 11/19/23 2257 11/20/23 0442  INR 1.3* 1.4*   Cardiac Enzymes: No results for input(s): CKTOTAL, CKMB, CKMBINDEX, TROPONINI in the last 168 hours. BNP (last 3 results) No results for input(s): PROBNP in the last 8760 hours. HbA1C: No results for input(s): HGBA1C in the last 72 hours. CBG: Recent Labs  Lab 11/20/23 0244 11/20/23 1620 11/20/23 2221 11/21/23 0721 11/21/23 1142  GLUCAP 97 200* 275* 307* 371*   Lipid Profile: No results for input(s): CHOL, HDL, LDLCALC, TRIG, CHOLHDL, LDLDIRECT in the last 72 hours. Thyroid Function Tests: No results for input(s): TSH, T4TOTAL, FREET4, T3FREE, THYROIDAB in the last 72 hours. Anemia Panel: No results for input(s): VITAMINB12, FOLATE, FERRITIN, TIBC, IRON , RETICCTPCT in the last 72 hours. Sepsis Labs: Recent Labs  Lab 11/19/23 2257 11/19/23 2336 11/20/23 0034 11/20/23 1857  PROCALCITON  --  4.29  --   --   LATICACIDVEN 1.3  --  1.2 0.8    Recent Results (from the past 240 hours)  Culture, blood (Routine x 2)     Status: None (Preliminary result)   Collection Time: 11/19/23 10:56 PM   Specimen: BLOOD  Result Value Ref Range Status   Specimen Description BLOOD RIGHT ANTECUBITAL  Final   Special Requests   Final    BOTTLES DRAWN AEROBIC AND ANAEROBIC Blood Culture results may not be optimal due to an excessive volume of blood received in culture bottles   Culture   Final    NO GROWTH 2 DAYS Performed at [facility], [address] [place], [place] [address]    Report Status PENDING  Incomplete  Culture, blood (Routine x 2)     Status: None (Preliminary result)   Collection Time: 11/19/23 10:57 PM   Specimen: Right Antecubital; Blood  Result Value Ref Range Status   Specimen Description RIGHT ANTECUBITAL   Final   Special Requests   Final    BOTTLES DRAWN AEROBIC AND ANAEROBIC Blood Culture results may not be optimal due to an inadequate volume of blood received in culture bottles   Culture   Final    NO GROWTH 2 DAYS Performed at [facility], [address], [place], [place] [address]    Report Status PENDING  Incomplete  Urine Culture     Status: Abnormal (Preliminary result)   Collection Time: 11/19/23 11:37 PM   Specimen: Urine, Catheterized  Result Value Ref Range Status   Specimen Description   Final    URINE, CATHETERIZED Performed at [facility], [address], [place], [place] [address]    Special Requests   Final    NONE Performed at [facility], [address] [place], [place] [address]    Culture (A)  Final    >=100,000 COLONIES/mL ESCHERICHIA COLI SUSCEPTIBILITIES TO FOLLOW Performed at [facility], [address], [place], [place] [address]    Report Status PENDING  Incomplete  MRSA Next Gen by PCR, Nasal     Status: None   Collection Time: 11/20/23  3:34 AM   Specimen: Nasal Mucosa; Nasal Swab  Result Value Ref Range Status   MRSA by PCR Next Gen NOT DETECTED NOT DETECTED Final    Comment: (NOTE) The GeneXpert MRSA Assay (FDA approved for NASAL specimens only), is one component of a comprehensive MRSA colonization surveillance program. It is not intended to diagnose MRSA infection nor to guide or monitor treatment for MRSA infections. Test performance is not FDA approved in patients less than 66 years old. Performed at [facility], [address], [place], [place] [address]          Radiology Studies: ECHOCARDIOGRAM COMPLETE Result Date: 11/21/2023    ECHOCARDIOGRAM REPORT   Patient Name:   Martin Riddle Date of Exam: 11/21/2023 Medical Rec #:  [id number]          Height:       78.0 in Accession #:    [id number]         Weight:       369.0 lb Date of Birth:  February 06, 1971           BSA:          2.929 m  Patient Age:    53 years           BP:           123/67 mmHg Patient Gender: M                  HR:  73 bpm. Exam Location:  [facility] Procedure: 2D Echo, Cardiac Doppler, Color Doppler and Intracardiac            Opacification Agent (Both Spectral and Color Flow Doppler were            utilized during procedure). Indications:     CHF I50.31  History:         Patient has prior history of Echocardiogram examinations, most                  recent 05/31/2021.  Sonographer:     Clenton Czech RDCS, FASE Referring Phys:  [id number] Tiajuana Fluke Diagnosing Phys: Antonette Batters MD  Sonographer Comments: Technically challenging study due to limited acoustic windows, suboptimal apical window, suboptimal subcostal window and patient is obese. Image acquisition challenging due to patient body habitus and Image acquisition challenging due to respiratory motion. IMPRESSIONS  1. Left ventricular ejection fraction, by estimation, is 60 to 65%. The left ventricle has normal function. The left ventricle has no regional wall motion abnormalities. The left ventricular internal cavity size was moderately dilated. There is mild concentric left ventricular hypertrophy. Left ventricular diastolic parameters were normal.  2. Right ventricular systolic function is normal. The right ventricular size is normal.  3. The mitral valve is normal in structure. Trivial mitral valve regurgitation.  4. The aortic valve is normal in structure. Aortic valve regurgitation is not visualized. Conclusion(s)/Recommendation(s): Poor windows for evaluation of left ventricular function by transthoracic echocardiography. Would recommend an alternative means of evaluation. FINDINGS  Left Ventricle: Left ventricular ejection fraction, by estimation, is 60 to 65%. The left ventricle has normal function. The left ventricle has no regional wall motion abnormalities. Definity  contrast agent was given IV to delineate the left ventricular  endocardial  borders. Strain was performed and the global longitudinal strain is indeterminate. Global longitudinal strain performed but not reported based on interpreter judgement due to suboptimal tracking. The left ventricular internal cavity size was  moderately dilated. There is mild concentric left ventricular hypertrophy. Left ventricular diastolic parameters were normal. Right Ventricle: The right ventricular size is normal. No increase in right ventricular wall thickness. Right ventricular systolic function is normal. Left Atrium: Left atrial size was normal in size. Right Atrium: Right atrial size was normal in size. Pericardium: There is no evidence of pericardial effusion. Mitral Valve: The mitral valve is normal in structure. Trivial mitral valve regurgitation. Tricuspid Valve: The tricuspid valve is normal in structure. Tricuspid valve regurgitation is not demonstrated. Aortic Valve: The aortic valve is normal in structure. Aortic valve regurgitation is not visualized. Aortic valve peak gradient measures 10.8 mmHg. Pulmonic Valve: The pulmonic valve was normal in structure. Pulmonic valve regurgitation is not visualized. Aorta: The ascending aorta was not well visualized. IAS/Shunts: No atrial level shunt detected by color flow Doppler. Additional Comments: 3D was performed not requiring image post processing on an independent workstation and was indeterminate.  LEFT VENTRICLE PLAX 2D LVIDd:         5.80 cm      Diastology LVIDs:         3.80 cm      LV e' medial:    6.74 cm/s LV PW:         1.30 cm      LV E/e' medial:  18.5 LV IVS:        1.25 cm      LV e' lateral:   11.20 cm/s LVOT diam:  2.20 cm      LV E/e' lateral: 11.2 LV SV:         87 LV SV Index:   30 LVOT Area:     3.80 cm  LV Volumes (MOD) LV vol d, MOD A2C: 155.0 ml LV vol d, MOD A4C: 175.0 ml LV vol s, MOD A2C: 57.5 ml LV vol s, MOD A4C: 81.5 ml LV SV MOD A2C:     97.5 ml LV SV MOD A4C:     175.0 ml LV SV MOD BP:      99.4 ml LEFT ATRIUM            Index LA diam:      3.50 cm 1.19 cm/m LA Vol (A4C): 61.9 ml 21.13 ml/m  AORTIC VALVE                 PULMONIC VALVE AV Area (Vmax): 2.80 cm     PV Vmax:        1.17 m/s AV Vmax:        164.00 cm/s  PV Peak grad:   5.5 mmHg AV Peak Grad:   10.8 mmHg    RVOT Peak grad: 4 mmHg LVOT Vmax:      121.00 cm/s LVOT Vmean:     74.200 cm/s LVOT VTI:       0.228 m  AORTA Ao Root diam: 3.60 cm Ao Asc diam:  3.20 cm MITRAL VALVE MV Area (PHT): 3.54 cm     SHUNTS MV Decel Time: 214 msec     Systemic VTI:  0.23 m MV E velocity: 125.00 cm/s  Systemic Diam: 2.20 cm MV A velocity: 121.00 cm/s MV E/A ratio:  1.03 Dwayne D Callwood MD Electronically signed by Antonette Batters MD Signature Date/Time: 11/21/2023/10:35:56 AM    Final    US  Venous Img Lower Unilateral Left (DVT) Result Date: 11/20/2023 CLINICAL DATA:  Left lower extremity pain and edema. EXAM: LEFT LOWER EXTREMITY VENOUS DOPPLER ULTRASOUND TECHNIQUE: Gray-scale sonography with graded compression, as well as color Doppler and duplex ultrasound were performed to evaluate the lower extremity deep venous systems from the level of the common femoral vein and including the common femoral, femoral, profunda femoral, popliteal and calf veins including the posterior tibial, peroneal and gastrocnemius veins when visible. The superficial great saphenous vein was also interrogated. Spectral Doppler was utilized to evaluate flow at rest and with distal augmentation maneuvers in the common femoral, femoral and popliteal veins. COMPARISON:  None Available. FINDINGS: Contralateral Common Femoral Vein: Respiratory phasicity is normal and symmetric with the symptomatic side. No evidence of thrombus. Normal compressibility. Common Femoral Vein: No evidence of thrombus. Normal compressibility, respiratory phasicity and response to augmentation. Saphenofemoral Junction: No evidence of thrombus. Normal compressibility and flow on color Doppler imaging. Profunda Femoral Vein: No evidence  of thrombus. Normal compressibility and flow on color Doppler imaging. Femoral Vein: No evidence of thrombus. Normal compressibility, respiratory phasicity and response to augmentation. Popliteal Vein: No evidence of thrombus. Normal compressibility, respiratory phasicity and response to augmentation. Calf Veins: Left calf veins are centrally nonvisualized due to body habitus. Superficial Great Saphenous Vein: No evidence of thrombus. Normal compressibility. Venous Reflux:  None. Other Findings: No evidence of superficial thrombophlebitis or abnormal fluid collection. IMPRESSION: No evidence of left lower extremity deep venous thrombosis. Left calf veins are centrally nonvisualized due to body habitus. Electronically Signed   By: Erica Hau M.D.   On: 11/20/2023 16:33   DG Chest [facility]  Result Date: 11/19/2023 CLINICAL DATA:  Altered mental status, fall, questionable sepsis EXAM: PORTABLE CHEST 1 VIEW COMPARISON:  10/27/2023 FINDINGS: Stable cardiomegaly. Pulmonary vascular congestion. No focal consolidation, pleural effusion, or pneumothorax. No displaced rib fractures. IMPRESSION: Cardiomegaly with pulmonary vascular congestion. Electronically Signed   By: Rozell Cornet M.D.   On: 11/19/2023 23:26        Scheduled Meds:  aspirin   81 mg Oral Daily   atorvastatin   20 mg Oral Daily   Chlorhexidine  Gluconate Cloth  6 each Topical Daily   clopidogrel   75 mg Oral Daily   DULoxetine   60 mg Oral Daily   insulin  aspart  0-5 Units Subcutaneous QHS   insulin  aspart  0-6 Units Subcutaneous TID WC   insulin  glargine-yfgn  90 Units Subcutaneous BID   ipratropium-albuterol   3 mL Nebulization Q6H   methylPREDNISolone  (SOLU-MEDROL ) injection  40 mg Intravenous Daily   midodrine  5 mg Oral TID WC   Continuous Infusions:  ceFEPime  (MAXIPIME ) IV 2 g (11/21/23 1043)   heparin  3,200 Units/hr (11/21/23 1021)   metronidazole  500 mg (11/21/23 0532)   norepinephrine  (LEVOPHED ) Adult infusion        LOS: 1 day   CRITICAL CARE Performed by: Tiajuana Fluke   Total critical care time: 40 minutes  Critical care time was exclusive of separately billable procedures and treating other patients.  Critical care was necessary to treat or prevent imminent or life-threatening deterioration.  Critical care was time spent personally by me on the following activities: development of treatment plan with patient and/or surrogate as well as nursing, discussions with consultants, evaluation of patient's response to treatment, examination of patient, obtaining history from patient or surrogate, ordering and performing treatments and interventions, ordering and review of laboratory studies, ordering and review of radiographic studies, pulse oximetry and re-evaluation of patient's condition.     Tiajuana Fluke, MD [facility]   If 7PM-7AM, please contact night-coverage  11/21/2023, 12:09 PM

## 2023-11-21 NOTE — Consult Note (Signed)
 PHARMACY - ANTICOAGULATION CONSULT NOTE  Pharmacy Consult for Heparin  Indication: empiric treatment for VTE pending VQ scan  Allergies  Allergen Reactions   Morphine Other (See Comments)    headache     A HEADACHE AND SWEATING AND LETHARGIC headache  headache, ,  A HEADACHE AND SWEATING AND LETHARGIC headache   Morphine And Codeine Other (See Comments)    headache   Other Other (See Comments)    A HEADACHE AND SWEATING AND LETHARGIC    headache  A HEADACHE AND SWEATING AND LETHARGIC, , headache    Patient Measurements: Height: 6' 6 (198.1 cm) Weight: (!) 167.4 kg (369 lb 0.8 oz) IBW/kg (Calculated) : 91.4 HEPARIN  DW (KG): 130.2  Vital Signs: Temp: 98.6 F (37 C) (06/13 1900) Temp Source: Bladder (06/13 2000) BP: 96/50 (06/13 1800) Pulse Rate: 87 (06/13 1900)  Labs: Recent Labs    11/19/23 2257 11/20/23 0034 11/20/23 0442 11/20/23 0453 11/20/23 1516 11/20/23 2331  HGB 9.0*  --   --  8.5*  --   --   HCT 29.1*  --   --  27.9*  --   --   PLT 216  --   --  201  --   --   LABPROT 16.7*  --  16.9*  --   --   --   INR 1.3*  --  1.4*  --   --   --   HEPARINUNFRC  --   --   --   --  0.14* 0.21*  CREATININE 3.17*  --   --  3.77*  --   --   TROPONINIHS 44* 47*  --   --   --   --     Estimated Creatinine Clearance: 38.6 mL/min (A) (by C-G formula based on SCr of 3.77 mg/dL (H)).   Medical History: Past Medical History:  Diagnosis Date   Diabetes mellitus without complication (HCC)    Hypercholesteremia    Hypertension    Pancreatitis     Pertinent Medications:  No history of chronic anticoagulation PTA  Assessment: 53 year old male with extensive past medical history significant for insulin -dependent diabetes mellitus, hypertension, morbid obesity, PVD status post right AKA on 5/22 recent discharge skilled nursing facility. Patient was brought back in from a skilled nursing facility with working diagnosis of serous sepsis of unknown source associated with a  respiratory failure requiring initiation of NIPPV and severe AKI. Pharmacy has been consulted to initiate and monitor continuous heparin  infusion due to concern for possible VTE(VQ scan pending)  Baseline labs: INR 1.4, Hgb 8.5, Plts 201  Date Time Results Comments 6/13 1516 HL = 0.14 Subtherapeutic @ 2200 units/hr 6/13 2331 HL 0.21 Subtherapeutic  Goal of Therapy:  Heparin  level 0.3-0.7 units/ml Monitor platelets by anticoagulation protocol: Yes   Plan:  Heparin  2000 units IV x 1 dose Increase heparin  infusion rate to 2700 units/hr Recheck HL in 6 hours after rate change Continue to monitor H&H and platelets  Coretta Dexter, PharmD, [facility] 11/21/2023 12:47 AM

## 2023-11-21 NOTE — Evaluation (Addendum)
 Physical Therapy Evaluation Patient Details Name: Martin Riddle MRN: [id number] DOB: Aug 23, 1970 Today's Date: 11/21/2023  History of Present Illness  53 year old male with extensive past medical history significant for insulin -dependent diabetes mellitus, hypertension, morbid obesity, PVD status post right AKA on 5/22 recent discharge skilled nursing facility.  Patient was brought back in from a skilled nursing facility with working diagnosis of serous sepsis of unknown source associated with a respiratory failure requiring initiation of NIPPV and severe AKI.  Apparently according to documentation the patient slid off the bed earlier at the SNF was noted to be confused and lethargic.  EMS was called.  Patient was hypoxic down into the 70s on 5 L.  Apparently was febrile with Tmax 102 per EMS for which she received Tylenol .  Supposedly initial blood pressure was markedly low in the ED but did respond to aggressive intravenous fluid resuscitation.  Clinical Impression  Pt is a pleasant 53 year old male who was admitted for Sepsis. Pt previously at SNF post-R AKA, now re-admission to hospital from sepsis. Co-treat with OT. Pt performs bed mobility with mod a x2, requiring verbal and tactile cueing with increased time to perform mobility. Pt mobility limited this date d/t pain (8/10 on NPS).  Supine <> sit transfer successful although again continues to require mod a x2 to maintain stability while seated with x2 UE support, no LOB experienced. Pt able to withstand seated position for extended period of time, further mobility encouraged but pt unable to because of pain. Pt heavily educated on desensitization techniques for residual limb and with getting nursing to aid sitting EOB with supervision. Ambulation this date deferred d/t pain. Pt demonstrates deficits with overall muscular weakness, decreased balance, and activity intolerance. Pt would benefit from skilled PT interventions to address deficits above  and improve QoL. PT to follow acutely.          If plan is discharge home, recommend the following: A little help with walking and/or transfers;A lot of help with bathing/dressing/bathroom;Assistance with cooking/housework;Direct supervision/assist for medications management;Direct supervision/assist for financial management;Help with stairs or ramp for entrance;Assist for transportation;Supervision due to cognitive status   Can travel by private vehicle   No    Equipment Recommendations None recommended by PT  Recommendations for Other Services       Functional Status Assessment Patient has had a recent decline in their functional status and demonstrates the ability to make significant improvements in function in a reasonable and predictable amount of time.     Precautions / Restrictions Precautions Precautions: Fall Recall of Precautions/Restrictions: Impaired Restrictions Weight Bearing Restrictions Per Provider Order: Yes RLE Weight Bearing Per Provider Order: Non weight bearing Other Position/Activity Restrictions: NWB'ing R residual limb      Mobility  Bed Mobility Overal bed mobility: Needs Assistance Bed Mobility: Supine to Sit     Supine to sit: Mod assist, +2 for physical assistance Sit to supine: Mod assist, +2 for physical assistance   General bed mobility comments: increased time to complete movement, mod a x2 for UE support and mobility of L LE    Transfers                   General transfer comment: STS transfer deferred this date d/t pt pain    Ambulation/Gait               General Gait Details: ambulation deferred this date d/t pt pain  Stairs  Wheelchair Mobility     Tilt Bed    Modified Rankin (Stroke Patients Only)       Balance Overall balance assessment: Needs assistance Sitting-balance support: Bilateral upper extremity supported Sitting balance-Leahy Scale: Fair Sitting balance - Comments: sat EOB  with supervision, good tolerance to sitting       Standing balance comment: standing deferred this date d/t pt pain                             Pertinent Vitals/Pain Pain Assessment Pain Assessment: 0-10 Pain Score: 8  Pain Location: RLE phantom limb pain Pain Intervention(s): RN gave pain meds during session, Repositioned    Home Living Family/patient expects to be discharged to:: Skilled nursing facility Living Arrangements: Spouse/significant other Available Help at Discharge: Family Type of Home: House Home Access: Level entry       Home Layout: One level Home Equipment: Crutches      Prior Function Prior Level of Function : Independent/Modified Independent             Mobility Comments: used a RW to ambulate at SNF ADLs Comments: post R AKA, needs assistance with ADLs     Extremity/Trunk Assessment   Upper Extremity Assessment Upper Extremity Assessment: Defer to OT evaluation    Lower Extremity Assessment Lower Extremity Assessment: Generalized weakness RLE Deficits / Details: R residual limb with ACE wrap dressing, clean and intact. NWB'ing    Cervical / Trunk Assessment Cervical / Trunk Assessment: Kyphotic  Communication   Communication Communication: No apparent difficulties    Cognition Arousal: Alert Behavior During Therapy: WFL for tasks assessed/performed   PT - Cognitive impairments: Orientation   Orientation impairments: Time, Situation                   PT - Cognition Comments: Pt visibly confused and mildly disorientated, could not recall in detail past couple of days Following commands: Intact       Cueing Cueing Techniques: Verbal cues, Tactile cues     General Comments      Exercises Other Exercises Other Exercises: seated pull forward & pressing on headboard with L LE   Assessment/Plan    PT Assessment Patient needs continued PT services  PT Problem List Decreased strength;Decreased range of  motion;Decreased balance;Decreased activity tolerance;Decreased mobility;Decreased coordination;Decreased knowledge of use of DME;Decreased safety awareness;Decreased cognition;Decreased knowledge of precautions;Pain       PT Treatment Interventions DME instruction;Gait training;Functional mobility training;Therapeutic activities;Therapeutic exercise;Balance training;Neuromuscular re-education;Cognitive remediation;Patient/family education;Wheelchair mobility training    PT Goals (Current goals can be found in the Care Plan section)  Acute Rehab PT Goals Patient Stated Goal: get stronger and back at home PT Goal Formulation: With patient Time For Goal Achievement: 12/05/23 Potential to Achieve Goals: Good    Frequency Min 2X/week     Co-evaluation PT/OT/SLP Co-Evaluation/Treatment: Yes Reason for Co-Treatment: For patient/therapist safety;To address functional/ADL transfers;Complexity of the patient's impairments (multi-system involvement) PT goals addressed during session: Mobility/safety with mobility;Strengthening/ROM;Balance         AM-PAC PT 6 Clicks Mobility  Outcome Measure Help needed turning from your back to your side while in a flat bed without using bedrails?: A Lot Help needed moving from lying on your back to sitting on the side of a flat bed without using bedrails?: A Lot Help needed moving to and from a bed to a chair (including a wheelchair)?: A Lot Help needed standing up from a  chair using your arms (e.g., wheelchair or bedside chair)?: A Lot Help needed to walk in hospital room?: Total Help needed climbing 3-5 steps with a railing? : Total 6 Click Score: 10    End of Session   Activity Tolerance: Patient limited by pain Patient left: in bed;with call bell/phone within reach;with bed alarm set Nurse Communication: Mobility status PT Visit Diagnosis: Unsteadiness on feet (R26.81);Other abnormalities of gait and mobility (R26.89);Muscle weakness  (generalized) (M62.81) Pain - Right/Left: Right Pain - part of body: Leg    Time: 1415-1440 PT Time Calculation (min) (ACUTE ONLY): 25 min   Charges:   PT Evaluation $PT Eval Moderate Complexity: 1 Mod   PT General Charges $$ ACUTE PT VISIT: 1 Visit         Charonda Hefter Romero-Perozo, SPT  11/21/2023, 3:12 PM

## 2023-11-21 NOTE — Progress Notes (Signed)
  Echocardiogram 2D Echocardiogram has been performed. Definity  IV ultrasound imaging agent used on this study to enhance endocardial definition.  Nichola Barges 11/21/2023, 8:15 AM

## 2023-11-21 NOTE — Consult Note (Addendum)
 Consultation Note Date: 11/21/2023 at 1245  Patient Name: Martin Riddle  DOB: 09/30/1970  MRN: [id number]  Age / Sex: 53 y.o., male  PCP: Thomasene Flemings, NP Referring Physician: Tiajuana Fluke, MD  HPI/Patient Profile:  53 y.o. 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  Of note he was discharged 11/09/2023 to SNF for rehab status post right AKA.  He presented to ED via EMS from [facility] 11/19/2023 complaining of AMS and malodorous urine. EMS found patient to be confused and lethargic on scene with a temp of 102 for which she was given IV Tylenol .  ED workup found patient's BP to be in the 40s/20s, HR 80s to 90s and tachypneic in the mid 20s.  He was hypoxic at 85% room air requiring 6 L O2 and subsequently transitioning to BiPAP to maintain sats in the high 90s. ED labs showed WBC 7.9, Hgb 9.0, BUN 37, creatinine 3.17, GFR 22, albumin  3.1, troponin 44, BNP 90.4 and lactic acid 1.2.  Chest x-ray showed cardiomegaly with pulmonary vascular congestion.  Patient was initially consulted to [facility] for admission.  Following admission patient became hypotensive requiring vasopressors.  Patient was admitted to ICU for management of severe sepsis, AKI, acute respiratory failure with hypoxia and acute metabolic encephalopathy.  PMT was consulted for goals of care conversations.  Patient is well-known to PMT after consult during previous admission last month.  Clinical Assessment and Goals of Care: Extensive chart review completed prior to meeting patient including labs, vital signs, imaging, progress notes, orders, and available advanced directive  documents from current and previous encounters. I then met with patient and son, Melodie Spry at bedside to discuss diagnosis prognosis, GOC, EOL wishes, disposition and options.  I introduced Palliative Medicine as specialized medical care for people living with serious illness. It focuses on providing relief from the symptoms and stress of a serious illness. The goal is to improve quality of life for both the patient and the family.  Ill-appearing, older than stated age male resting in bed with family at bedside.  He is alert to self, location and able to name son at bedside.  HFNC in place.  He is in no distress.  Reports leg and back pain, 8/10.  Reports sleeping well last night.  He is eating approximately half of his meals.  He denies chest pain.  Reports shortness of breath is relieved with oxygen.  We discussed patient's current illness and what it means in the larger context of patient's on-going co-morbidities.  Natural disease trajectory and expectations at EOL were discussed.  I attempted to elicit values and goals of care important to the patient.  Patient wants to return to facility to be able to rehab with goal of returning home.  The difference between aggressive medical intervention and comfort care  was considered in light of the patient's goals of care.  Mr. Natzke wants to continue all aggressive medical intervention to include CPR and intubation if needed for a short trial.  Advance directives, concepts specific to code status, artificial feeding and hydration, and rehospitalization were considered and discussed.  He remains a full scope/full code.  Family is facing treatment option decisions, advanced directive, and anticipatory care needs.  Patient reports wife, Esequiel Hector, is his decision-maker with Majed Pellegrin secondary surrogate decision maker.  Ordean Fouts (son) at bedside had no questions at time of visit but was encouraged to contact PMT with questions or  concerns.  Discussed with patient/family the importance of continued conversation with family and the medical providers regarding overall plan of care and treatment options, ensuring decisions are within the context of the patient's values and GOCs.    Questions and concerns were addressed. The family was encouraged to call with questions or concerns.   Primary Decision Maker PATIENT  Physical Exam Vitals reviewed.  Constitutional:      General: He is not in acute distress.    Appearance: He is obese. He is ill-appearing.  HENT:     Head: Normocephalic and atraumatic.     Mouth/Throat:     Mouth: Mucous membranes are moist.  Pulmonary:     Comments: HHF Viola in place  Musculoskeletal:     Comments: Bandage to right AKA C/D/I   Skin:    General: Skin is warm and dry.   Neurological:     Mental Status: He is alert and oriented to person, place, and time.   Psychiatric:        Mood and Affect: Mood normal.        Behavior: Behavior normal.    Recommendations/Plan: FULL CODE status as previously documented    Continue current supportive interventions Plan to d/c home when medically stable PMT to follow peripherally as goals are clear.  Please contact if other needs arise.    Thank you for this consult. Palliative medicine will continue to follow and assist holistically.   Time Total: 75 minutes  Time spent includes: Detailed review of medical records (labs, imaging, vital signs), medically appropriate exam (mental status, respiratory, cardiac, skin), discussed with treatment team, counseling and educating patient, family and staff, documenting clinical information, medication management and coordination of care.     Ina Manas, Joyice Nodal- [facility] Palliative Medicine Team  11/21/2023 9:23 AM  Office [phone]  Pager [phone]     Please contact Palliative Medicine Team providers via AMION for questions and concerns.

## 2023-11-21 NOTE — Plan of Care (Signed)
  Problem: Education: Goal: Knowledge of General Education information will improve Description: Including pain rating scale, medication(s)/side effects and non-pharmacologic comfort measures Outcome: Progressing   Problem: Clinical Measurements: Goal: Ability to maintain clinical measurements within normal limits will improve Outcome: Progressing Goal: Diagnostic test results will improve Outcome: Progressing Goal: Respiratory complications will improve Outcome: Progressing Goal: Cardiovascular complication will be avoided Outcome: Progressing   Problem: Fluid Volume: Goal: Hemodynamic stability will improve Outcome: Progressing

## 2023-11-21 NOTE — Inpatient Diabetes Management (Addendum)
 Inpatient Diabetes Program Recommendations  AACE/ADA: New Consensus Statement on Inpatient Glycemic Control (2015)  Target Ranges:  Prepandial:   less than 140 mg/dL      Peak postprandial:   less than 180 mg/dL (1-2 hours)      Critically ill patients:  140 - 180 mg/dL    Latest Reference Range & Units 10/27/23 13:02  Hemoglobin A1C 4.8 - 5.6 % 8.9 (H)  (H): Data is abnormally high   Latest Reference Range & Units 11/20/23 02:44 11/20/23 16:20 11/20/23 22:21 11/21/23 07:21 11/21/23 11:42 11/21/23 15:37  Glucose-Capillary 70 - 99 mg/dL 97 086 (H)  1 unit Novolog   275 (H)  3 units Novolog   90 units Semglee  307 (H)  14 units Novolog   371 (H)  20 units Novolog   90 units Semglee  466 (H)  20 units Novolog    (H): Data is abnormally high    Admit with: Acute hypoxic respiratory failure/ Severe sepsis/ Acute Kidney Injury/ UTI  History: DM  Home DM Meds: Novolog  30 units TID with meals       Novolog  0-20 units QID per SSI       Semglee  90 units BID  Current Orders: Semglee  90 units BID     Novolog  Resistant Correction Scale/ SSI (0-20 units) TID AC + HS    MD- Note pt getting Solumedrol 40 mg daily Takes heavy doses insulin  at SNF CBG 466 at 3:30pm  Please consider Starting Novolog  Meal Coverage: Novolog  10 units TID with meals (1/3 total home dose to start) HOLD if pt NPO HOLD if pt eats <50% meals      --Will follow patient during hospitalization--  Langston Pippins RN, MSN, CDCES Diabetes Coordinator Inpatient Glycemic Control Team Team Pager: [phone] (8a-5p)

## 2023-11-21 NOTE — Progress Notes (Signed)
 Dr. Mosetta Areola  notified of elevated CBG 466. Per Dr. Mosetta Areola will order 10 additional units of novolog . Will administer and continue to monitor.

## 2023-11-21 NOTE — Progress Notes (Signed)
 OT Cancellation Note  Patient Details Name: Lenville Hibberd MRN: [id number] DOB: [birth year]   Cancelled Treatment:    Reason Eval/Treat Not Completed: Other (comment). Consult received, chart reviewed. K+ noted to be 5.9. Spoke with RN. Pending new lab draw. Hold therapy this morning. Will follow acutely and intervene when appropriate.   Charm Stenner R., MPH, MS, OTR/L ascom [phone] 11/21/23, 10:21 AM

## 2023-11-21 NOTE — Evaluation (Signed)
 Occupational Therapy Evaluation Patient Details Name: Martin Riddle MRN: [id number] DOB: 1970/06/10 Today's Date: 11/21/2023   History of Present Illness   53 year old male with extensive past medical history significant for insulin -dependent diabetes mellitus, hypertension, morbid obesity, PVD status post right AKA on 5/22 recent discharge skilled nursing facility.  Patient was brought back in from a skilled nursing facility with working diagnosis of serous sepsis of unknown source associated with a respiratory failure requiring initiation of NIPPV and severe AKI.  Apparently according to documentation the patient slid off the bed earlier at the SNF was noted to be confused and lethargic.  EMS was called.  Patient was hypoxic down into the 70s on 5 L.  Apparently was febrile with Tmax 102 per EMS for which she received Tylenol .  Supposedly initial blood pressure was markedly low in the ED but did respond to aggressive intravenous fluid resuscitation.     Clinical Impressions Pt seen for OT evaluation with PT co-tx to optimize safety for ADL/mobility. Prior to this admission, pt was at rehab following hospitalization and R AKA. Pt endorsing significant residual limb pain, RN gave meds at start of session. Pt required MOD A +2 for bed mobility and tolerated sitting EOB for ~34min without LOB. Initially requiring UE vs BUE support on EOB, improving to no UE support. Fatigue noted and returned to bed. Pt educated in desensitization strategies for residual limb and pt able to return demo. Pt will benefit from skilled OT services to optimize return to PLOF.      If plan is discharge home, recommend the following:   Two people to help with walking and/or transfers;Two people to help with bathing/dressing/bathroom;Assist for transportation;Help with stairs or ramp for entrance;Assistance with cooking/housework     Functional Status Assessment   Patient has had a recent decline in their  functional status and demonstrates the ability to make significant improvements in function in a reasonable and predictable amount of time.     Equipment Recommendations   Other (comment) (defer)     Recommendations for Other Services         Precautions/Restrictions   Precautions Precautions: Fall Recall of Precautions/Restrictions: Impaired Restrictions Weight Bearing Restrictions Per Provider Order: Yes RLE Weight Bearing Per Provider Order: Non weight bearing Other Position/Activity Restrictions: NWB'ing R residual limb     Mobility Bed Mobility Overal bed mobility: Needs Assistance Bed Mobility: Supine to Sit, Sit to Supine     Supine to sit: Mod assist, +2 for physical assistance Sit to supine: Mod assist, +2 for physical assistance   General bed mobility comments: increased time to complete movement, mod a x2 for UE support and mobility of L LE    Transfers                   General transfer comment: STS transfer deferred this date d/t pt pain      Balance Overall balance assessment: Needs assistance Sitting-balance support: Bilateral upper extremity supported Sitting balance-Leahy Scale: Fair Sitting balance - Comments: sat EOB with supervision, good tolerance to sitting       Standing balance comment: standing deferred this date d/t pt pain                           ADL either performed or assessed with clinical judgement   ADL  Vision         Perception         Praxis         Pertinent Vitals/Pain Pain Assessment Pain Assessment: 0-10 Pain Score: 8  Pain Location: residual limb Pain Intervention(s): RN gave pain meds during session, Repositioned, Monitored during session     Extremity/Trunk Assessment Upper Extremity Assessment Upper Extremity Assessment: Generalized weakness (LUE arterial line)   Lower Extremity Assessment Lower Extremity  Assessment: Generalized weakness RLE Deficits / Details: R residual limb with ACE wrap dressing, clean and intact. NWB'ing   Cervical / Trunk Assessment Cervical / Trunk Assessment: Kyphotic   Communication Communication Communication: No apparent difficulties   Cognition Arousal: Alert Behavior During Therapy: WFL for tasks assessed/performed Cognition: No family/caregiver present to determine baseline             OT - Cognition Comments: mild STM deficits, poor recall of previously learned instructions for residual limb desensitization                 Following commands: Intact Following commands impaired: Follows multi-step commands with increased time     Cueing  General Comments   Cueing Techniques: Verbal cues;Tactile cues      Exercises Other Exercises Other Exercises: Pt educated in desensitization strategies for residual limb and pt able to return demo   Shoulder Instructions      Home Living Family/patient expects to be discharged to:: Skilled nursing facility Living Arrangements: Spouse/significant other Available Help at Discharge: Family Type of Home: House Home Access: Level entry     Home Layout: One level     Bathroom Shower/Tub: Tub/shower unit;Walk-in shower   Bathroom Toilet: Standard Bathroom Accessibility: Yes   Home Equipment: Crutches          Prior Functioning/Environment Prior Level of Function : Independent/Modified Independent             Mobility Comments: used a RW to ambulate at SNF ADLs Comments: post R AKA, needs assistance with ADLs    OT Problem List: Decreased strength;Decreased range of motion;Decreased activity tolerance;Impaired balance (sitting and/or standing);Pain;Obesity;Increased edema;Impaired sensation;Decreased knowledge of precautions;Decreased knowledge of use of DME or AE;Cardiopulmonary status limiting activity   OT Treatment/Interventions: Self-care/ADL training;Therapeutic exercise;Energy  conservation;DME and/or AE instruction;Therapeutic activities;Patient/family education;Balance training      OT Goals(Current goals can be found in the care plan section)   Acute Rehab OT Goals Patient Stated Goal: get stronger OT Goal Formulation: With patient Time For Goal Achievement: 12/05/23 Potential to Achieve Goals: Good ADL Goals Pt Will Perform Upper Body Dressing: with modified independence;sitting Pt Will Perform Lower Body Dressing: with modified independence;sitting/lateral leans Pt Will Transfer to Toilet: with transfer board;stand pivot transfer;with min assist;bedside commode;grab bars Pt Will Perform Toileting - Clothing Manipulation and hygiene: sitting/lateral leans;sit to/from stand;with adaptive equipment;with mod assist Additional ADL Goal #1: Pt will utilize learned ECS during ADL and mobility with PRN VC to utilize, 3/3 opportunities.   OT Frequency:  Min 3X/week    Co-evaluation PT/OT/SLP Co-Evaluation/Treatment: Yes Reason for Co-Treatment: For patient/therapist safety;To address functional/ADL transfers;Complexity of the patient's impairments (multi-system involvement) PT goals addressed during session: Mobility/safety with mobility;Strengthening/ROM;Balance OT goals addressed during session: ADL's and self-care;Proper use of Adaptive equipment and DME      AM-PAC OT 6 Clicks Daily Activity     Outcome Measure Help from another person eating meals?: None Help from another person taking care of personal grooming?: A Little Help from another person toileting, which includes  using toliet, bedpan, or urinal?: A Lot Help from another person bathing (including washing, rinsing, drying)?: A Lot Help from another person to put on and taking off regular upper body clothing?: A Little Help from another person to put on and taking off regular lower body clothing?: A Lot 6 Click Score: 16   End of Session Equipment Utilized During Treatment: Oxygen Nurse  Communication: Mobility status;Other (comment) (arterial line)  Activity Tolerance: Patient tolerated treatment well Patient left: in bed;with call bell/phone within reach;with bed alarm set  OT Visit Diagnosis: Pain;Other abnormalities of gait and mobility (R26.89);Unsteadiness on feet (R26.81);Muscle weakness (generalized) (M62.81) Pain - Right/Left: Right Pain - part of body: Leg                Time: 1415-1440 OT Time Calculation (min): 25 min Charges:  OT General Charges $OT Visit: 1 Visit OT Evaluation $OT Eval Moderate Complexity: 1 Mod  Berenda Breaker., MPH, MS, OTR/L ascom [phone] 11/21/23, 3:47 PM

## 2023-11-22 ENCOUNTER — Inpatient Hospital Stay

## 2023-11-22 DIAGNOSIS — R6520 Severe sepsis without septic shock: Secondary | ICD-10-CM | POA: Diagnosis not present

## 2023-11-22 DIAGNOSIS — A419 Sepsis, unspecified organism: Secondary | ICD-10-CM | POA: Diagnosis not present

## 2023-11-22 LAB — BASIC METABOLIC PANEL WITH GFR
Anion gap: 7 (ref 5–15)
BUN: 35 mg/dL — ABNORMAL HIGH (ref 6–20)
CO2: 27 mmol/L (ref 22–32)
Calcium: 9.2 mg/dL (ref 8.9–10.3)
Chloride: 98 mmol/L (ref 98–111)
Creatinine, Ser: 0.82 mg/dL (ref 0.61–1.24)
GFR, Estimated: >60 mL/min (ref >60)
Glucose, Bld: 298 mg/dL — ABNORMAL HIGH (ref 70–99)
Potassium: 4.6 mmol/L (ref 3.5–5.1)
Sodium: 132 mmol/L — ABNORMAL LOW (ref 135–145)

## 2023-11-22 LAB — URINE CULTURE: Culture: >=100000 — AB

## 2023-11-22 LAB — CBC
HCT: 28.6 % — ABNORMAL LOW (ref 39.0–52.0)
Hemoglobin: 8.8 g/dL — ABNORMAL LOW (ref 13.0–17.0)
MCH: 28.3 pg (ref 26.0–34.0)
MCHC: 30.8 g/dL (ref 30.0–36.0)
MCV: 92 fL (ref 80.0–100.0)
Platelets: 132 10*3/uL — ABNORMAL LOW (ref 150–400)
RBC: 3.11 MIL/uL — ABNORMAL LOW (ref 4.22–5.81)
RDW: 16.1 % — ABNORMAL HIGH (ref 11.5–15.5)
WBC: 5.2 10*3/uL (ref 4.0–10.5)
nRBC: 0 % (ref 0.0–0.2)

## 2023-11-22 LAB — HEPARIN LEVEL (UNFRACTIONATED): Heparin Unfractionated: 0.34 [IU]/mL (ref 0.30–0.70)

## 2023-11-22 LAB — GLUCOSE, CAPILLARY
Glucose-Capillary: 235 mg/dL — ABNORMAL HIGH (ref 70–99)
Glucose-Capillary: 158 mg/dL — ABNORMAL HIGH (ref 70–99)
Glucose-Capillary: 149 mg/dL — ABNORMAL HIGH (ref 70–99)
Glucose-Capillary: 196 mg/dL — ABNORMAL HIGH (ref 70–99)

## 2023-11-22 MED ORDER — ENOXAPARIN SODIUM 80 MG/0.8ML IJ SOSY
0.5000 mg/kg | PREFILLED_SYRINGE | INTRAMUSCULAR | Status: DC
  Administered 2023-11-22 – 2023-11-24 (×3): 80 mg via SUBCUTANEOUS
  Filled 2023-11-22 (×3): qty 0.8

## 2023-11-22 MED ORDER — SODIUM CHLORIDE 0.9 % IV SOLN: INTRAVENOUS | Status: AC | PRN

## 2023-11-22 MED ORDER — INSULIN GLARGINE-YFGN 100 UNIT/ML ~~LOC~~ SOLN
92.0000 [IU] | Freq: Two times a day (BID) | SUBCUTANEOUS | Status: DC
  Administered 2023-11-22 – 2023-11-24 (×4): 92 [IU] via SUBCUTANEOUS
  Filled 2023-11-22 (×6): qty 0.92

## 2023-11-22 MED ORDER — ENOXAPARIN SODIUM 40 MG/0.4ML IJ SOSY: 40.0000 mg | PREFILLED_SYRINGE | INTRAMUSCULAR | Status: DC

## 2023-11-22 MED ORDER — IOHEXOL 350 MG/ML SOLN
75.0000 mL | Freq: Once | INTRAVENOUS | Status: AC | PRN
  Administered 2023-11-22: 75 mL via INTRAVENOUS

## 2023-11-22 NOTE — Inpatient Diabetes Management (Signed)
 Inpatient Diabetes Program Recommendations  AACE/ADA: New Consensus Statement on Inpatient Glycemic Control (2015)  Target Ranges:  Prepandial:   less than 140 mg/dL      Peak postprandial:   less than 180 mg/dL (1-2 hours)      Critically ill patients:  140 - 180 mg/dL    Latest Reference Range & Units 11/21/23 07:21 11/21/23 11:42 11/21/23 15:37 11/21/23 18:16 11/21/23 21:40  Glucose-Capillary 70 - 99 mg/dL 782 (H)  14 units Novolog   371 (H)  20 units Novolog   90 units Semglee   466 (H)  20 units Novolog   353 (H)  10 units Novolog   390 (H)  5 units Novolog   90 units Semglee   (H): Data is abnormally high  Latest Reference Range & Units 11/22/23 07:41  Glucose-Capillary 70 - 99 mg/dL 956 (H)  17 units Novolog    (H): Data is abnormally high  Home DM Meds: Novolog  30 units TID with meals                             Novolog  0-20 units QID per SSI                             Semglee  90 units BID   Current Orders: Semglee  90 units BID                           Novolog  Resistant Correction Scale/ SSI (0-20 units) TID AC + HS         Novolog  10 units TID with meals     MD- Note pt remains on Solumedrol 40 mg daily CBG 235 this AM Novolog  10 units TID with meals started this AM  May consider increasing the Semglee  slightly to 92 units BID since AM CBG elevated    --Will follow patient during hospitalization--  Langston Pippins RN, MSN, CDCES Diabetes Coordinator Inpatient Glycemic Control Team Team Pager: [phone] (8a-5p)

## 2023-11-22 NOTE — Progress Notes (Signed)
 PROGRESS NOTE    Martin Riddle  NWG:[id number] DOB: 13-Dec-1970 DOA: 11/19/2023 PCP: Thomasene Flemings, NP    Brief Narrative:  53 year old male with extensive past medical history significant for insulin -dependent diabetes mellitus, hypertension, morbid obesity, PVD status post right AKA on 5/22 recent discharge skilled nursing facility.  Patient was brought back in from a skilled nursing facility with working diagnosis of serous sepsis of unknown source associated with a respiratory failure requiring initiation of NIPPV and severe AKI.  Apparently according to documentation the patient slid off the bed earlier at the SNF was noted to be confused and lethargic.  EMS was called.  Patient was hypoxic down into the 70s on 5 L.  Apparently was febrile with Tmax 102 per EMS for which she received Tylenol .  Supposedly initial blood pressure was markedly low in the ED but did respond to aggressive intravenous fluid resuscitation.   Patient is currently on NIPPV, remains physically in ICU.  PCCM team consulted.  Patient is high risk for further respiratory decompensation and endotracheal intubation.  6/14: Looks much better today.  Awake and alert.  Answers questions appropriately.  Kidney function improved.   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  Possible contribution from heart failure but BNP is negative.  Difficult to assess volume status given body habitus.  Given recent AKA pulmonary embolism is high on  differential.  Left lower extremity duplex negative.  CTA pursued on 6/15 also negative.  Weaned down to 5 L nasal cannula. Plan:  Discontinue heparinization Continue steroids and nebulizers given concern for bronchoconstriction If there is continued clinical concern for venous thromboembolism will pursue VQ scan though this seems less likely at this time Can transfer to progressive unit  Severe sepsis Altered mental status, hypotension, fever at skilled nursing.  Interestingly elevated procalcitonin with normal white blood cell count, lactic acid normal.  Source unknown.  Did have recent procedure.  Urine definitely infected. Plan: Continue broad-spectrum IV antibiotics DC IV fluids DC midodrine Transfer to progressive  Acute kidney injury Chronic urinary retention Urinary tract infection Baseline kidney function appears normal 0 .79 Reached a maximum of 3.77, currently downtrending Normalized as of 6/15 Plan: Avoid nephrotoxins Daily renal parameters Continue Foley Continue empiric antibiotics Follow cultures  Acute metabolic encephalopathy Lethargic and confused.  Multifactorial related to hypoxia and sepsis Much improved.  Appears at baseline  PAD Status post right AKA 5/22 Wound evaluated.  Appears intact and not infected.  Likely no indication for vascular consult but will consider.  Continue following blood cultures, no growth to date  Type 2 diabetes mellitus with peripheral neuropathy Continue basal bolus regimen Sliding scale coverage Carb modified diet  Class III obesity This complicates overall care prognosis  DVT prophylaxis: SQ lovenox  Code Status: Full Family Communication:Wife at bedside 6/13 Disposition Plan: Status is: Inpatient Remains inpatient appropriate because: Multiple acute issues as above   Level of care: Progressive  Consultants:  PCCM Palliative care  Procedures:  Non  Antimicrobials: Vancomycin  Cefepime  Flagyl   Subjective: Seen and examined.  Much more comfortable this morning.  Awake and alert.  Objective: Vitals:   11/22/23 1200 11/22/23 1216 11/22/23 1300 11/22/23 1339  BP: (!) 151/96     Pulse: 92 90 88   Resp: 20 (!) 24 (!) 24   Temp: 97.7 F (36.5 C)     TempSrc: Oral     SpO2: 94% 92% 96% 97%  Weight:      Height:        Intake/Output Summary (Last 24 hours) at 11/22/2023 1426 Last data filed at 11/22/2023 0800 Gross per 24 hour  Intake 1675.11 ml  Output 4545 ml  Net -2869.89 ml   Filed Weights   11/20/23 0254 11/22/23 0400  Weight: (!) 167.4 kg (!) 160.3 kg    Examination:  General exam: No acute distress.  Appears chronically ill.  Appears older than stated age Respiratory system: Bibasilar crackles.  Normal work of breathing 5 L Cardiovascular system: S1-S2, RRR, no murmurs, no pedal edema Gastrointestinal system:,, Soft, NT/ND, normal bowel sounds GU: Foley Central nervous system: Alert and oriented. No focal neurological deficits. Extremities: Left lower extremity AKA Skin: No rashes, lesions or ulcers Psychiatry: Judgement and insight appear normal. Mood & affect appropriate.     Data Reviewed: I have personally reviewed following labs and imaging studies  CBC: Recent Labs  Lab 11/19/23 2257 11/20/23 0453 11/21/23 0448 11/22/23 0452  WBC 7.9 7.2 5.1 5.2  NEUTROABS 5.7 5.2  --   --   HGB 9.0* 8.5* 9.0* 8.8*  HCT 29.1* 27.9* 29.5* 28.6*  MCV 93.0 95.5 93.4 92.0  PLT 216 201 152 132*   Basic Metabolic Panel: Recent Labs  Lab 11/19/23 2257 11/20/23 0453 11/21/23 0448 11/21/23 1047 11/22/23 0452  NA 129* 130* 134*  --  132*  K 5.0 4.9 5.9* 4.9 4.6  CL 96* 95* 100  --  98  CO2 24 24 21*  --  27  GLUCOSE 81 114* 311*  --  298*  BUN 37* 42* 44*  --  35*  CREATININE 3.17* 3.77* 1.35*  --  0.82  CALCIUM  8.5* 8.1* 8.9  --  9.2   GFR: Estimated Creatinine Clearance: 173.3 mL/min (by C-G formula based on SCr of 0.82 mg/dL). Liver Function  Tests: Recent Labs  Lab 11/19/23 2257 11/20/23 0453  AST 33 26  ALT 17 16  ALKPHOS 116 105  BILITOT 0.9 1.3*  PROT 6.6 6.9  ALBUMIN  3.1* 3.0*   No results for input(s): LIPASE, AMYLASE in the last 168 hours. No results for input(s): AMMONIA in the last 168 hours. Coagulation Profile: Recent Labs  Lab 11/19/23 2257 11/20/23 0442  INR 1.3* 1.4*   Cardiac Enzymes: No results for input(s): CKTOTAL, CKMB, CKMBINDEX, TROPONINI in the last 168 hours. BNP (last 3 results) No results for input(s): PROBNP in the last 8760 hours. HbA1C: No results for input(s): HGBA1C in the last 72 hours. CBG: Recent Labs  Lab 11/21/23 1537 11/21/23 1816 11/21/23 2140 11/22/23 0741 11/22/23 1151  GLUCAP 466* 353* 390* 235* 158*   Lipid Profile: No results for input(s): CHOL, HDL, LDLCALC, TRIG, CHOLHDL, LDLDIRECT in the last 72 hours. Thyroid Function Tests: No results for input(s): TSH, T4TOTAL, FREET4, T3FREE, THYROIDAB in the last 72 hours. Anemia Panel: No results for input(s): VITAMINB12, FOLATE, FERRITIN, TIBC, IRON , RETICCTPCT in the last 72 hours. Sepsis Labs: Recent Labs  Lab 11/19/23 2257 11/19/23 2336 11/20/23 0034 11/20/23 1857  PROCALCITON  --  4.29  --   --  LATICACIDVEN 1.3  --  1.2 0.8    Recent Results (from the past 240 hours)  Culture, blood (Routine x 2)     Status: None (Preliminary result)   Collection Time: 11/19/23 10:56 PM   Specimen: BLOOD  Result Value Ref Range Status   Specimen Description BLOOD RIGHT ANTECUBITAL  Final   Special Requests   Final    BOTTLES DRAWN AEROBIC AND ANAEROBIC Blood Culture results may not be optimal due to an excessive volume of blood received in culture bottles   Culture   Final    NO GROWTH 3 DAYS Performed at [facility], [address], [place], [place] [address]    Report Status PENDING  Incomplete  Culture, blood (Routine x 2)     Status: None  (Preliminary result)   Collection Time: 11/19/23 10:57 PM   Specimen: Right Antecubital; Blood  Result Value Ref Range Status   Specimen Description RIGHT ANTECUBITAL  Final   Special Requests   Final    BOTTLES DRAWN AEROBIC AND ANAEROBIC Blood Culture results may not be optimal due to an inadequate volume of blood received in culture bottles   Culture   Final    NO GROWTH 3 DAYS Performed at [facility], [address], [place], [place] [address]    Report Status PENDING  Incomplete  Urine Culture     Status: Abnormal   Collection Time: 11/19/23 11:37 PM   Specimen: Urine, Catheterized  Result Value Ref Range Status   Specimen Description   Final    URINE, CATHETERIZED Performed at [facility], [address] [place], [place] [address]    Special Requests   Final    NONE Performed at [facility], [address] [place], [place] [address]    Culture >=100,000 COLONIES/mL ESCHERICHIA COLI (A)  Final   Report Status 11/22/2023 FINAL  Final   Organism ID, Bacteria ESCHERICHIA COLI (A)  Final      Susceptibility   Escherichia coli - MIC*    AMPICILLIN  >=32 RESISTANT Resistant     CEFAZOLIN  >=64 RESISTANT Resistant     CEFEPIME  <=0.12 SENSITIVE Sensitive     CEFTRIAXONE  <=0.25 SENSITIVE Sensitive     CIPROFLOXACIN <=0.25 SENSITIVE Sensitive     GENTAMICIN <=1 SENSITIVE Sensitive     IMIPENEM <=0.25 SENSITIVE Sensitive     NITROFURANTOIN <=16 SENSITIVE Sensitive     TRIMETH/SULFA <=20 SENSITIVE Sensitive     AMPICILLIN /SULBACTAM >=32 RESISTANT Resistant     PIP/TAZO <=4 SENSITIVE Sensitive ug/mL    * >=100,000 COLONIES/mL ESCHERICHIA COLI  MRSA Next Gen by PCR, Nasal     Status: None   Collection Time: 11/20/23  3:34 AM   Specimen: Nasal Mucosa; Nasal Swab  Result Value Ref Range Status   MRSA by PCR Next Gen NOT DETECTED NOT DETECTED Final    Comment: (NOTE) The GeneXpert MRSA Assay (FDA approved for NASAL specimens only), is one  component of a comprehensive MRSA colonization surveillance program. It is not intended to diagnose MRSA infection nor to guide or monitor treatment for MRSA infections. Test performance is not FDA approved in patients less than 45 years old. Performed at [facility], [address], [place], [place] [address]          Radiology Studies: CT Angio Chest Pulmonary Embolism (PE) W or WO Contrast Result Date: 11/22/2023 CLINICAL DATA:  Pulmonary embolism (PE) suspected, high prob EXAM: CT ANGIOGRAPHY CHEST WITH CONTRAST TECHNIQUE: Multidetector CT imaging  of the chest was performed using the standard protocol during bolus administration of intravenous contrast. Multiplanar CT image reconstructions and MIPs were obtained to evaluate the vascular anatomy. RADIATION DOSE REDUCTION: This exam was performed according to the departmental dose-optimization program which includes automated exposure control, adjustment of the mA and/or kV according to patient size and/or use of iterative reconstruction technique. CONTRAST:  75mL OMNIPAQUE  IOHEXOL  350 MG/ML SOLN COMPARISON:  Oct 27, 2023 FINDINGS: Evaluation is limited by body habitus, arms down positioning and quantum mottle. Cardiovascular: There is suboptimal contrast opacification of the pulmonary arteries combined with above mentioned limitations which limits this exam. No central pulmonary embolism through the segmental pulmonary arteries. Predominately LEFT-sided coronary artery atherosclerotic calcifications. Heart is mildly enlarged. No pericardial effusion. Thoracic aorta is normal in course and caliber. Mediastinum/Nodes: Similar appearance of the thyroid. No axillary or mediastinal adenopathy. Lungs/Pleura: Trace bilateral pleural effusions. Patchy bibasilar airspace opacities. Mild centrilobular basilar predominant ground-glass opacities. Upper Abdomen: Splenomegaly. Prominence of the LEFT liver with lobulated liver margins. There are  multiple prominent upper abdominal lymph nodes, suboptimally assessed due to streak artifact from arms down positioning. Representative lymph node of the porta hepatis measures 19 mm (series 5, image 174). Musculoskeletal: No chest wall abnormality. No acute or significant osseous findings. Butterfly vertebra T9. Review of the MIP images confirms the above findings. IMPRESSION: 1. Evaluation is limited by body habitus, arms down positioning and quantum mottle. No central pulmonary embolism through the segmental pulmonary arteries. If future clinical concern for pulmonary embolism, patient may be a better candidate for a V/Q scan in the future. 2. Trace bilateral pleural effusions with patchy bibasilar airspace opacities and ground-glass centrilobular opacities. Differential considerations include atelectasis, edema or infection. 3. Splenomegaly with multiple prominent upper abdominal lymph nodes, suboptimally assessed due to streak artifact from arms down positioning. Recommend correlation with laboratory values. 4. Query early cirrhotic morphology of the liver. Aortic Atherosclerosis (ICD10-I70.0). Electronically Signed   By: Clancy Crimes M.D.   On: 11/22/2023 09:51   ECHOCARDIOGRAM COMPLETE Result Date: 11/21/2023    ECHOCARDIOGRAM REPORT   Patient Name:   Martin Riddle Date of Exam: 11/21/2023 Medical Rec #:  [id number]          Height:       78.0 in Accession #:    [id number]         Weight:       369.0 lb Date of Birth:  1970/07/22           BSA:          2.929 m Patient Age:    53 years           BP:           123/67 mmHg Patient Gender: M                  HR:           73 bpm. Exam Location:  [facility] Procedure: 2D Echo, Cardiac Doppler, Color Doppler and Intracardiac            Opacification Agent (Both Spectral and Color Flow Doppler were            utilized during procedure). Indications:     CHF I50.31  History:         Patient has prior history of Echocardiogram examinations, most                   recent 05/31/2021.  Sonographer:  Clenton Czech RDCS, FASE Referring Phys:  [id number] Tiajuana Fluke Diagnosing Phys: Antonette Batters MD  Sonographer Comments: Technically challenging study due to limited acoustic windows, suboptimal apical window, suboptimal subcostal window and patient is obese. Image acquisition challenging due to patient body habitus and Image acquisition challenging due to respiratory motion. IMPRESSIONS  1. Left ventricular ejection fraction, by estimation, is 60 to 65%. The left ventricle has normal function. The left ventricle has no regional wall motion abnormalities. The left ventricular internal cavity size was moderately dilated. There is mild concentric left ventricular hypertrophy. Left ventricular diastolic parameters were normal.  2. Right ventricular systolic function is normal. The right ventricular size is normal.  3. The mitral valve is normal in structure. Trivial mitral valve regurgitation.  4. The aortic valve is normal in structure. Aortic valve regurgitation is not visualized. Conclusion(s)/Recommendation(s): Poor windows for evaluation of left ventricular function by transthoracic echocardiography. Would recommend an alternative means of evaluation. FINDINGS  Left Ventricle: Left ventricular ejection fraction, by estimation, is 60 to 65%. The left ventricle has normal function. The left ventricle has no regional wall motion abnormalities. Definity  contrast agent was given IV to delineate the left ventricular  endocardial borders. Strain was performed and the global longitudinal strain is indeterminate. Global longitudinal strain performed but not reported based on interpreter judgement due to suboptimal tracking. The left ventricular internal cavity size was  moderately dilated. There is mild concentric left ventricular hypertrophy. Left ventricular diastolic parameters were normal. Right Ventricle: The right ventricular size is normal. No increase in right  ventricular wall thickness. Right ventricular systolic function is normal. Left Atrium: Left atrial size was normal in size. Right Atrium: Right atrial size was normal in size. Pericardium: There is no evidence of pericardial effusion. Mitral Valve: The mitral valve is normal in structure. Trivial mitral valve regurgitation. Tricuspid Valve: The tricuspid valve is normal in structure. Tricuspid valve regurgitation is not demonstrated. Aortic Valve: The aortic valve is normal in structure. Aortic valve regurgitation is not visualized. Aortic valve peak gradient measures 10.8 mmHg. Pulmonic Valve: The pulmonic valve was normal in structure. Pulmonic valve regurgitation is not visualized. Aorta: The ascending aorta was not well visualized. IAS/Shunts: No atrial level shunt detected by color flow Doppler. Additional Comments: 3D was performed not requiring image post processing on an independent workstation and was indeterminate.  LEFT VENTRICLE PLAX 2D LVIDd:         5.80 cm      Diastology LVIDs:         3.80 cm      LV e' medial:    6.74 cm/s LV PW:         1.30 cm      LV E/e' medial:  18.5 LV IVS:        1.25 cm      LV e' lateral:   11.20 cm/s LVOT diam:     2.20 cm      LV E/e' lateral: 11.2 LV SV:         87 LV SV Index:   30 LVOT Area:     3.80 cm  LV Volumes (MOD) LV vol d, MOD A2C: 155.0 ml LV vol d, MOD A4C: 175.0 ml LV vol s, MOD A2C: 57.5 ml LV vol s, MOD A4C: 81.5 ml LV SV MOD A2C:     97.5 ml LV SV MOD A4C:     175.0 ml LV SV MOD BP:      99.4 ml  LEFT ATRIUM           Index LA diam:      3.50 cm 1.19 cm/m LA Vol (A4C): 61.9 ml 21.13 ml/m  AORTIC VALVE                 PULMONIC VALVE AV Area (Vmax): 2.80 cm     PV Vmax:        1.17 m/s AV Vmax:        164.00 cm/s  PV Peak grad:   5.5 mmHg AV Peak Grad:   10.8 mmHg    RVOT Peak grad: 4 mmHg LVOT Vmax:      121.00 cm/s LVOT Vmean:     74.200 cm/s LVOT VTI:       0.228 m  AORTA Ao Root diam: 3.60 cm Ao Asc diam:  3.20 cm MITRAL VALVE MV Area (PHT): 3.54  cm     SHUNTS MV Decel Time: 214 msec     Systemic VTI:  0.23 m MV E velocity: 125.00 cm/s  Systemic Diam: 2.20 cm MV A velocity: 121.00 cm/s MV E/A ratio:  1.03 Dwayne D Callwood MD Electronically signed by Antonette Batters MD Signature Date/Time: 11/21/2023/10:35:56 AM    Final         Scheduled Meds:  aspirin   81 mg Oral Daily   atorvastatin   20 mg Oral Daily   Chlorhexidine  Gluconate Cloth  6 each Topical Daily   clopidogrel   75 mg Oral Daily   DULoxetine   60 mg Oral Daily   insulin  aspart  0-20 Units Subcutaneous TID WC   insulin  aspart  0-5 Units Subcutaneous QHS   insulin  aspart  10 Units Subcutaneous TID WC   insulin  glargine-yfgn  92 Units Subcutaneous BID   ipratropium-albuterol   3 mL Nebulization TID   methocarbamol   500 mg Oral TID   methylPREDNISolone  (SOLU-MEDROL ) injection  40 mg Intravenous Daily   Continuous Infusions:  sodium chloride  Stopped (11/22/23 0520)   ceFEPime  (MAXIPIME ) IV 2 g (11/22/23 1036)   metronidazole  Stopped (11/22/23 0432)   norepinephrine  (LEVOPHED ) Adult infusion Stopped (11/22/23 0324)     LOS: 2 days      Tiajuana Fluke, MD [facility]   If 7PM-7AM, please contact night-coverage  11/22/2023, 2:26 PM

## 2023-11-22 NOTE — Plan of Care (Signed)
  Problem: Education: Goal: Knowledge of General Education information will improve Description: Including pain rating scale, medication(s)/side effects and non-pharmacologic comfort measures Outcome: Progressing   Problem: Health Behavior/Discharge Planning: Goal: Ability to manage health-related needs will improve Outcome: Progressing   Problem: Clinical Measurements: Goal: Respiratory complications will improve Outcome: Progressing   

## 2023-11-22 NOTE — Consult Note (Signed)
 PHARMACY - ANTICOAGULATION CONSULT NOTE  Pharmacy Consult for Heparin  Indication: empiric treatment for VTE pending VQ scan  Allergies  Allergen Reactions   Morphine Other (See Comments)    headache     A HEADACHE AND SWEATING AND LETHARGIC headache  headache, ,  A HEADACHE AND SWEATING AND LETHARGIC headache   Morphine And Codeine Other (See Comments)    headache   Other Other (See Comments)    A HEADACHE AND SWEATING AND LETHARGIC    headache  A HEADACHE AND SWEATING AND LETHARGIC, , headache    Patient Measurements: Height: 6' 6 (198.1 cm) Weight: (!) 160.3 kg (353 lb 6.4 oz) IBW/kg (Calculated) : 91.4 HEPARIN  DW (KG): 130.2  Vital Signs: Temp: 98.5 F (36.9 C) (06/15 0330) Temp Source: Axillary (06/15 0330) Pulse Rate: 70 (06/15 0400)  Labs: Recent Labs    11/19/23 2257 11/20/23 0034 11/20/23 0442 11/20/23 0453 11/20/23 1516 11/21/23 0448 11/21/23 0842 11/21/23 1700 11/21/23 2312 11/22/23 0452  HGB 9.0*  --   --  8.5*  --  9.0*  --   --   --  8.8*  HCT 29.1*  --   --  27.9*  --  29.5*  --   --   --  28.6*  PLT 216  --   --  201  --  152  --   --   --  132*  LABPROT 16.7*  --  16.9*  --   --   --   --   --   --   --   INR 1.3*  --  1.4*  --   --   --   --   --   --   --   HEPARINUNFRC  --   --   --   --    < >  --    < > 0.31 0.34 0.34  CREATININE 3.17*  --   --  3.77*  --  1.35*  --   --   --  0.82  TROPONINIHS 44* 47*  --   --   --   --   --   --   --   --    < > = values in this interval not displayed.    Estimated Creatinine Clearance: 173.3 mL/min (by C-G formula based on SCr of 0.82 mg/dL).   Medical History: Past Medical History:  Diagnosis Date   Diabetes mellitus without complication (HCC)    Hypercholesteremia    Hypertension    Pancreatitis     Pertinent Medications:  No history of chronic anticoagulation PTA  Assessment: 53 year old male with extensive past medical history significant for insulin -dependent diabetes mellitus,  hypertension, morbid obesity, PVD status post right AKA on 5/22 recent discharge skilled nursing facility. Patient was brought back in from a skilled nursing facility with working diagnosis of serous sepsis of unknown source associated with a respiratory failure requiring initiation of NIPPV and severe AKI. Pharmacy has been consulted to initiate and monitor continuous heparin  infusion due to concern for possible VTE(VQ scan pending)  Baseline labs: INR 1.4, Hgb 8.5, Plts 201  Date Time Results Comments 6/13 1516 HL = 0.14 Subtherapeutic  6/13 2331 HL 0.21 Subtherapeutic 6/14 1700 HL = 0.31 Therapeutic x1 6/14 2312 HL 0.34 Therapeutic x 2 6/15 0452 HL 0.34 Therapeutic x 3  Goal of Therapy:  Heparin  level 0.3-0.7 units/ml Monitor platelets by anticoagulation protocol: Yes   Plan:  Continue heparin  infusion at 3200 units/hr Recheck HL  daily w/ AM labs while therapeutic Continue to monitor H&H and platelets  Thank you for involving pharmacy in this patient's care.   Coretta Dexter, PharmD, [facility] 11/22/2023 5:20 AM

## 2023-11-22 NOTE — Progress Notes (Signed)
 PHARMACIST - PHYSICIAN COMMUNICATION  CONCERNING:  Enoxaparin  (Lovenox ) for DVT Prophylaxis    RECOMMENDATION: Patient was prescribed enoxaprin 40mg  q24 hours for VTE prophylaxis.   Filed Weights   11/20/23 0254 11/22/23 0400  Weight: (!) 167.4 kg (369 lb 0.8 oz) (!) 160.3 kg (353 lb 6.4 oz)    Body mass index is 40.84 kg/m.  Estimated Creatinine Clearance: 173.3 mL/min (by C-G formula based on SCr of 0.82 mg/dL).   Based on [facility] policy patient is candidate for enoxaparin  0.5mg /kg TBW SQ every 24 hours based on BMI being >30.   DESCRIPTION: Pharmacy has adjusted enoxaparin  dose per [facility] policy.  Patient is now receiving enoxaparin  0.5 mg/kg every 24 hours    Adalberto Acton, PharmD Clinical Pharmacist  11/22/2023 2:42 PM

## 2023-11-22 NOTE — Consult Note (Signed)
 PHARMACY - ANTICOAGULATION CONSULT NOTE  Pharmacy Consult for Heparin  Indication: empiric treatment for VTE pending VQ scan  Allergies  Allergen Reactions   Morphine Other (See Comments)    headache     A HEADACHE AND SWEATING AND LETHARGIC headache  headache, ,  A HEADACHE AND SWEATING AND LETHARGIC headache   Morphine And Codeine Other (See Comments)    headache   Other Other (See Comments)    A HEADACHE AND SWEATING AND LETHARGIC    headache  A HEADACHE AND SWEATING AND LETHARGIC, , headache    Patient Measurements: Height: 6' 6 (198.1 cm) Weight: (!) 167.4 kg (369 lb 0.8 oz) IBW/kg (Calculated) : 91.4 HEPARIN  DW (KG): 130.2  Vital Signs: Temp: 97.9 F (36.6 C) (06/14 1921) Temp Source: Oral (06/14 1921) Pulse Rate: 76 (06/14 2300)  Labs: Recent Labs    11/19/23 2257 11/20/23 0034 11/20/23 0442 11/20/23 0453 11/20/23 1516 11/21/23 0448 11/21/23 0842 11/21/23 1700 11/21/23 2312  HGB 9.0*  --   --  8.5*  --  9.0*  --   --   --   HCT 29.1*  --   --  27.9*  --  29.5*  --   --   --   PLT 216  --   --  201  --  152  --   --   --   LABPROT 16.7*  --  16.9*  --   --   --   --   --   --   INR 1.3*  --  1.4*  --   --   --   --   --   --   HEPARINUNFRC  --   --   --   --    < >  --  0.13* 0.31 0.34  CREATININE 3.17*  --   --  3.77*  --  1.35*  --   --   --   TROPONINIHS 44* 47*  --   --   --   --   --   --   --    < > = values in this interval not displayed.    Estimated Creatinine Clearance: 107.8 mL/min (A) (by C-G formula based on SCr of 1.35 mg/dL (H)).   Medical History: Past Medical History:  Diagnosis Date   Diabetes mellitus without complication (HCC)    Hypercholesteremia    Hypertension    Pancreatitis     Pertinent Medications:  No history of chronic anticoagulation PTA  Assessment: 53 year old male with extensive past medical history significant for insulin -dependent diabetes mellitus, hypertension, morbid obesity, PVD status post right  AKA on 5/22 recent discharge skilled nursing facility. Patient was brought back in from a skilled nursing facility with working diagnosis of serous sepsis of unknown source associated with a respiratory failure requiring initiation of NIPPV and severe AKI. Pharmacy has been consulted to initiate and monitor continuous heparin  infusion due to concern for possible VTE(VQ scan pending)  Baseline labs: INR 1.4, Hgb 8.5, Plts 201  Date Time Results Comments 6/13 1516 HL = 0.14 Subtherapeutic  6/13 2331 HL 0.21 Subtherapeutic 6/14 1700 HL = 0.31 Therapeutic x1 6/14 2312 HL 0.34 Therapeutic x 2  Goal of Therapy:  Heparin  level 0.3-0.7 units/ml Monitor platelets by anticoagulation protocol: Yes   Plan:  Continue heparin  infusion at 3200 units/hr Recheck HL daily w/ AM labs while therapeutic Continue to monitor H&H and platelets  Thank you for involving pharmacy in this patient's care.  Coretta Dexter, PharmD, [facility] 11/22/2023 12:02 AM

## 2023-11-22 NOTE — Progress Notes (Signed)
 Pt to be transfer to [id number]. Report given to [facility]. All questions answered. All belongings sent with pt. VSS at this time.

## 2023-11-23 DIAGNOSIS — A419 Sepsis, unspecified organism: Secondary | ICD-10-CM | POA: Diagnosis not present

## 2023-11-23 DIAGNOSIS — R6520 Severe sepsis without septic shock: Secondary | ICD-10-CM | POA: Diagnosis not present

## 2023-11-23 LAB — GLUCOSE, CAPILLARY
Glucose-Capillary: 122 mg/dL — ABNORMAL HIGH (ref 70–99)
Glucose-Capillary: 129 mg/dL — ABNORMAL HIGH (ref 70–99)
Glucose-Capillary: 258 mg/dL — ABNORMAL HIGH (ref 70–99)
Glucose-Capillary: 190 mg/dL — ABNORMAL HIGH (ref 70–99)
Glucose-Capillary: 377 mg/dL — ABNORMAL HIGH (ref 70–99)

## 2023-11-23 LAB — HEPARIN LEVEL (UNFRACTIONATED): Heparin Unfractionated: 0.2 [IU]/mL — ABNORMAL LOW (ref 0.30–0.70)

## 2023-11-23 LAB — BASIC METABOLIC PANEL WITH GFR
Anion gap: 10 (ref 5–15)
BUN: 29 mg/dL — ABNORMAL HIGH (ref 6–20)
CO2: 29 mmol/L (ref 22–32)
Calcium: 9.7 mg/dL (ref 8.9–10.3)
Chloride: 98 mmol/L (ref 98–111)
Creatinine, Ser: 0.68 mg/dL (ref 0.61–1.24)
GFR, Estimated: >60 mL/min (ref >60)
Glucose, Bld: 177 mg/dL — ABNORMAL HIGH (ref 70–99)
Potassium: 4 mmol/L (ref 3.5–5.1)
Sodium: 137 mmol/L (ref 135–145)

## 2023-11-23 LAB — CBC
HCT: 28.5 % — ABNORMAL LOW (ref 39.0–52.0)
Hemoglobin: 8.7 g/dL — ABNORMAL LOW (ref 13.0–17.0)
MCH: 28 pg (ref 26.0–34.0)
MCHC: 30.5 g/dL (ref 30.0–36.0)
MCV: 91.6 fL (ref 80.0–100.0)
Platelets: 138 10*3/uL — ABNORMAL LOW (ref 150–400)
RBC: 3.11 MIL/uL — ABNORMAL LOW (ref 4.22–5.81)
RDW: 15.7 % — ABNORMAL HIGH (ref 11.5–15.5)
WBC: 5 10*3/uL (ref 4.0–10.5)
nRBC: 0 % (ref 0.0–0.2)

## 2023-11-23 MED ORDER — PREGABALIN 75 MG PO CAPS
200.0000 mg | ORAL_CAPSULE | Freq: Two times a day (BID) | ORAL | Status: DC
  Administered 2023-11-23 – 2023-11-25 (×5): 200 mg via ORAL
  Filled 2023-11-23 (×5): qty 1

## 2023-11-23 MED ORDER — ATORVASTATIN CALCIUM 20 MG PO TABS
40.0000 mg | ORAL_TABLET | Freq: Every day | ORAL | Status: DC
  Administered 2023-11-24 – 2023-11-25 (×2): 40 mg via ORAL
  Filled 2023-11-23 (×2): qty 2

## 2023-11-23 MED ORDER — GLUCERNA SHAKE PO LIQD
237.0000 mL | Freq: Three times a day (TID) | ORAL | Status: DC
  Administered 2023-11-23 – 2023-11-24 (×5): 237 mL via ORAL

## 2023-11-23 MED ORDER — SODIUM CHLORIDE 0.9 % IV SOLN
2.0000 g | INTRAVENOUS | Status: DC
  Administered 2023-11-23 – 2023-11-24 (×2): 2 g via INTRAVENOUS
  Filled 2023-11-23 (×3): qty 20

## 2023-11-23 MED ORDER — TAMSULOSIN HCL 0.4 MG PO CAPS
0.4000 mg | ORAL_CAPSULE | Freq: Every day | ORAL | Status: DC
  Administered 2023-11-23 – 2023-11-25 (×3): 0.4 mg via ORAL
  Filled 2023-11-23 (×3): qty 1

## 2023-11-23 MED ORDER — SODIUM CHLORIDE 0.9 % IV SOLN: INTRAVENOUS | Status: AC | PRN

## 2023-11-23 NOTE — Progress Notes (Signed)
 PROGRESS NOTE    Martin Riddle  ZOX:[id number] DOB: [birth year] DOA: 11/19/2023 PCP: Thomasene Flemings, NP    Brief Narrative:  53 year old male with extensive past medical history significant for insulin -dependent diabetes mellitus, hypertension, morbid obesity, PVD status post right AKA on 5/22 recent discharge skilled nursing facility.  Patient was brought back in from a skilled nursing facility with working diagnosis of serous sepsis of unknown source associated with a respiratory failure requiring initiation of NIPPV and severe AKI.  Apparently according to documentation the patient slid off the bed earlier at the SNF was noted to be confused and lethargic.  EMS was called.  Patient was hypoxic down into the 70s on 5 L.  Apparently was febrile with Tmax 102 per EMS for which she received Tylenol .  Supposedly initial blood pressure was markedly low in the ED but did respond to aggressive intravenous fluid resuscitation.   Patient is currently on NIPPV, remains physically in ICU.  PCCM team consulted.  Patient is high risk for further respiratory decompensation and endotracheal intubation.  6/14: Looks much better today.  Awake and alert.  Answers questions appropriately.  Kidney function improved.   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  Possible contribution from heart failure but BNP is negative.  Difficult to assess volume status given body habitus.  Given recent AKA pulmonary embolism is high on  differential.  Left lower extremity duplex negative.  CTA pursued on 6/15 also negative.  Weaned down to 5 L nasal cannula. Plan:  Continue steroids to complete a 5-day course.  Continue scheduled bronchodilators.  There is concern for bronchoconstriction.  Otherwise unclear exactly what drove the patient's marked hypoxia.  He is on room air.  Severe sepsis Altered mental status, hypotension, fever at skilled nursing.  Interestingly elevated procalcitonin with normal white blood cell count, lactic acid normal.  Source unknown.  Did have recent procedure.  Urine definitely infected. Plan: Continue cefepime  for total 5-day course Monitor vitals and fever curve  Acute kidney injury Chronic urinary retention Urinary tract infection Baseline kidney function appears normal 0 .79 Reached a maximum of 3.77, currently downtrending Normalized as of 6/15 Plan: Avoid nephrotoxins Daily renal parameters Continue Foley for now Continue empiric antibiotics for now Follow cultures  Acute metabolic encephalopathy Lethargic and confused.  Multifactorial related to hypoxia and sepsis Much improved.  Appears at baseline  PAD Status post right AKA 5/22 Wound evaluated.  Appears intact and not infected.   Outpatient follow-up  Type 2 diabetes mellitus with peripheral neuropathy Continue basal bolus regimen Sliding scale coverage Carb modified diet  Class III obesity This complicates overall care prognosis  DVT prophylaxis: SQ lovenox  Code Status: Full Family Communication:Wife at bedside 6/13, 6/16 Disposition Plan: Status is: Inpatient Remains inpatient appropriate because: Multiple acute issues as above   Level of care: Progressive  Consultants:  PCCM Palliative care  Procedures:  None  Antimicrobials: Cefepime      Subjective: Seen and examined.  Much more comfortable this morning.  Awake alert.  Objective: Vitals:   11/23/23 0311 11/23/23 0350 11/23/23 0744 11/23/23  1151  BP: (!) 104/55 135/75 (!) 157/75 (!) 152/94  Pulse: (!) 109 88 87 94  Resp:  20 20 20   Temp:  97.7 F (36.5 C) 98.7 F (37.1 C) 98.1 F (36.7 C)  TempSrc:  Oral    SpO2: 97% 94% 93% 91%  Weight:      Height:        Intake/Output Summary (Last 24 hours) at 11/23/2023 1258 Last data filed at 11/23/2023 0900 Gross per 24 hour  Intake 279.98 ml  Output 3300 ml  Net -3020.02 ml   Filed Weights   11/20/23 0254 11/22/23 0400  Weight: (!) 167.4 kg (!) 160.3 kg    Examination:  General exam: NAD.  Appears chronically ill.  Appears older than stated age Respiratory system: Lungs clear.  Normal work of breathing.  Room air Cardiovascular system: S1-S2, RRR, no murmurs, no pedal edema Gastrointestinal system:,, Soft, NT/ND, normal bowel sounds GU: Foley Central nervous system: Alert and oriented. No focal neurological deficits. Extremities: Left lower extremity AKA Skin: No rashes, lesions or ulcers Psychiatry: Judgement and insight appear normal. Mood & affect appropriate.     Data Reviewed: I have personally reviewed following labs and imaging studies  CBC: Recent Labs  Lab 11/19/23 2257 11/20/23 0453 11/21/23 0448 11/22/23 0452 11/23/23 0254  WBC 7.9 7.2 5.1 5.2 5.0  NEUTROABS 5.7 5.2  --   --   --   HGB 9.0* 8.5* 9.0* 8.8* 8.7*  HCT 29.1* 27.9* 29.5* 28.6* 28.5*  MCV 93.0 95.5 93.4 92.0 91.6  PLT 216 201 152 132* 138*   Basic Metabolic Panel: Recent Labs  Lab 11/19/23 2257 11/20/23 0453 11/21/23 0448 11/21/23 1047 11/22/23 0452 11/23/23 0254  NA 129* 130* 134*  --  132* 137  K 5.0 4.9 5.9* 4.9 4.6 4.0  CL 96* 95* 100  --  98 98  CO2 24 24 21*  --  27 29  GLUCOSE 81 114* 311*  --  298* 177*  BUN 37* 42* 44*  --  35* 29*  CREATININE 3.17* 3.77* 1.35*  --  0.82 0.68  CALCIUM  8.5* 8.1* 8.9  --  9.2 9.7   GFR: Estimated Creatinine Clearance: 177.7 mL/min (by C-G formula based on SCr of 0.68 mg/dL). Liver Function Tests: Recent Labs  Lab  11/19/23 2257 11/20/23 0453  AST 33 26  ALT 17 16  ALKPHOS 116 105  BILITOT 0.9 1.3*  PROT 6.6 6.9  ALBUMIN  3.1* 3.0*   No results for input(s): LIPASE, AMYLASE in the last 168 hours. No results for input(s): AMMONIA in the last 168 hours. Coagulation Profile: Recent Labs  Lab 11/19/23 2257 11/20/23 0442  INR 1.3* 1.4*   Cardiac Enzymes: No results for input(s): CKTOTAL, CKMB, CKMBINDEX, TROPONINI in the last 168 hours. BNP (last 3 results) No results for input(s): PROBNP in the last 8760 hours. HbA1C: No results for input(s): HGBA1C in the last 72 hours. CBG: Recent Labs  Lab 11/22/23 1519 11/22/23 2016 11/23/23 0745 11/23/23 1023 11/23/23 1207  GLUCAP 149* 196* 122* 129* 258*   Lipid Profile: No results for input(s): CHOL, HDL, LDLCALC, TRIG, CHOLHDL, LDLDIRECT in the last 72 hours. Thyroid Function Tests: No results for input(s): TSH, T4TOTAL, FREET4, T3FREE, THYROIDAB in the last 72 hours. Anemia Panel: No results for input(s): VITAMINB12, FOLATE, FERRITIN, TIBC, IRON , RETICCTPCT in the last 72 hours. Sepsis Labs: Recent Labs  Lab 11/19/23 2257 11/19/23 2336 11/20/23  0034 11/20/23 1857  PROCALCITON  --  4.29  --   --   LATICACIDVEN 1.3  --  1.2 0.8    Recent Results (from the past 240 hours)  Culture, blood (Routine x 2)     Status: None (Preliminary result)   Collection Time: 11/19/23 10:56 PM   Specimen: BLOOD  Result Value Ref Range Status   Specimen Description BLOOD RIGHT ANTECUBITAL  Final   Special Requests   Final    BOTTLES DRAWN AEROBIC AND ANAEROBIC Blood Culture results may not be optimal due to an excessive volume of blood received in culture bottles   Culture   Final    NO GROWTH 4 DAYS Performed at [facility], [address], [place], [place] [address]    Report Status PENDING  Incomplete  Culture, blood (Routine x 2)     Status: None (Preliminary result)   Collection  Time: 11/19/23 10:57 PM   Specimen: Right Antecubital; Blood  Result Value Ref Range Status   Specimen Description RIGHT ANTECUBITAL  Final   Special Requests   Final    BOTTLES DRAWN AEROBIC AND ANAEROBIC Blood Culture results may not be optimal due to an inadequate volume of blood received in culture bottles   Culture   Final    NO GROWTH 4 DAYS Performed at [facility], [address] [place], [place] [address]    Report Status PENDING  Incomplete  Urine Culture     Status: Abnormal   Collection Time: 11/19/23 11:37 PM   Specimen: Urine, Catheterized  Result Value Ref Range Status   Specimen Description   Final    URINE, CATHETERIZED Performed at [facility], [address] [place], [place] [address]    Special Requests   Final    NONE Performed at [facility], [address] [place], [place] [address]    Culture >=100,000 COLONIES/mL ESCHERICHIA COLI (A)  Final   Report Status 11/22/2023 FINAL  Final   Organism ID, Bacteria ESCHERICHIA COLI (A)  Final      Susceptibility   Escherichia coli - MIC*    AMPICILLIN  >=32 RESISTANT Resistant     CEFAZOLIN  >=64 RESISTANT Resistant     CEFEPIME  <=0.12 SENSITIVE Sensitive     CEFTRIAXONE  <=0.25 SENSITIVE Sensitive     CIPROFLOXACIN <=0.25 SENSITIVE Sensitive     GENTAMICIN <=1 SENSITIVE Sensitive     IMIPENEM <=0.25 SENSITIVE Sensitive     NITROFURANTOIN <=16 SENSITIVE Sensitive     TRIMETH/SULFA <=20 SENSITIVE Sensitive     AMPICILLIN /SULBACTAM >=32 RESISTANT Resistant     PIP/TAZO <=4 SENSITIVE Sensitive ug/mL    * >=100,000 COLONIES/mL ESCHERICHIA COLI  MRSA Next Gen by PCR, Nasal     Status: None   Collection Time: 11/20/23  3:34 AM   Specimen: Nasal Mucosa; Nasal Swab  Result Value Ref Range Status   MRSA by PCR Next Gen NOT DETECTED NOT DETECTED Final    Comment: (NOTE) The GeneXpert MRSA Assay (FDA approved for NASAL specimens only), is one component of a comprehensive MRSA  colonization surveillance program. It is not intended to diagnose MRSA infection nor to guide or monitor treatment for MRSA infections. Test performance is not FDA approved in patients less than 7 years old. Performed at [facility], [address], [place], [place] [address]          Radiology Studies: CT Angio Chest Pulmonary Embolism (PE) W or WO Contrast Result Date: 11/22/2023 CLINICAL DATA:  Pulmonary embolism (PE) suspected, high prob EXAM: CT ANGIOGRAPHY CHEST WITH CONTRAST TECHNIQUE: Multidetector CT imaging of the chest was performed using the standard protocol during bolus administration of intravenous contrast. Multiplanar CT image reconstructions and MIPs were obtained to evaluate the vascular anatomy. RADIATION DOSE REDUCTION: This exam was performed according to the departmental dose-optimization program which includes automated exposure control, adjustment of the mA and/or kV according to patient size and/or use of iterative reconstruction technique. CONTRAST:  75mL OMNIPAQUE  IOHEXOL  350 MG/ML SOLN COMPARISON:  Oct 27, 2023 FINDINGS: Evaluation is limited by body habitus, arms down positioning and quantum mottle. Cardiovascular: There is suboptimal contrast opacification of the pulmonary arteries combined with above mentioned limitations which limits this exam. No central pulmonary embolism through the segmental pulmonary arteries. Predominately LEFT-sided coronary artery atherosclerotic calcifications. Heart is mildly enlarged. No pericardial effusion. Thoracic aorta is normal in course and caliber. Mediastinum/Nodes: Similar appearance of the thyroid. No axillary or mediastinal adenopathy. Lungs/Pleura: Trace bilateral pleural effusions. Patchy bibasilar airspace opacities. Mild centrilobular basilar predominant ground-glass opacities. Upper Abdomen: Splenomegaly. Prominence of the LEFT liver with lobulated liver margins. There are multiple prominent upper abdominal lymph  nodes, suboptimally assessed due to streak artifact from arms down positioning. Representative lymph node of the porta hepatis measures 19 mm (series 5, image 174). Musculoskeletal: No chest wall abnormality. No acute or significant osseous findings. Butterfly vertebra T9. Review of the MIP images confirms the above findings. IMPRESSION: 1. Evaluation is limited by body habitus, arms down positioning and quantum mottle. No central pulmonary embolism through the segmental pulmonary arteries. If future clinical concern for pulmonary embolism, patient may be a better candidate for a V/Q scan in the future. 2. Trace bilateral pleural effusions with patchy bibasilar airspace opacities and ground-glass centrilobular opacities. Differential considerations include atelectasis, edema or infection. 3. Splenomegaly with multiple prominent upper abdominal lymph nodes, suboptimally assessed due to streak artifact from arms down positioning. Recommend correlation with laboratory values. 4. Query early cirrhotic morphology of the liver. Aortic Atherosclerosis (ICD10-I70.0). Electronically Signed   By: Clancy Crimes M.D.   On: 11/22/2023 09:51        Scheduled Meds:  aspirin   81 mg Oral Daily   [START ON 11/24/2023] atorvastatin   40 mg Oral Daily   Chlorhexidine  Gluconate Cloth  6 each Topical Daily   clopidogrel   75 mg Oral Daily   DULoxetine   60 mg Oral Daily   enoxaparin  (LOVENOX ) injection  0.5 mg/kg Subcutaneous Q24H   feeding supplement (GLUCERNA SHAKE)  237 mL Oral TID BM   insulin  aspart  0-20 Units Subcutaneous TID WC   insulin  aspart  0-5 Units Subcutaneous QHS   insulin  aspart  10 Units Subcutaneous TID WC   insulin  glargine-yfgn  92 Units Subcutaneous BID   methocarbamol   500 mg Oral TID   methylPREDNISolone  (SOLU-MEDROL ) injection  40 mg Intravenous Daily   pregabalin   200 mg Oral BID   Continuous Infusions:  sodium chloride      cefTRIAXone  (ROCEPHIN )  IV       LOS: 3 days       Tiajuana Fluke, MD [facility]   If 7PM-7AM, please contact night-coverage  11/23/2023, 12:58 PM

## 2023-11-23 NOTE — Care Management Important Message (Signed)
 Important Message  Patient Details  Name: Martin Riddle MRN: [id number] Date of Birth: [birth year]   Important Message Given:  Yes - Medicare IM     Anise Kerns 11/23/2023, 12:03 PM

## 2023-11-23 NOTE — Progress Notes (Signed)
 Physical Therapy Treatment Patient Details Name: Martin Riddle MRN: [id number] DOB: 10-13-1970 Today's Date: 11/23/2023   History of Present Illness 53 year old male with extensive past medical history significant for insulin -dependent diabetes mellitus, hypertension, morbid obesity, PVD status post right AKA on 5/22 recent discharge skilled nursing facility.  Patient was brought back in from a skilled nursing facility with working diagnosis of serous sepsis of unknown source associated with a respiratory failure requiring initiation of NIPPV and severe AKI.  Apparently according to documentation the patient slid off the bed earlier at the SNF was noted to be confused and lethargic.  EMS was called.  Patient was hypoxic down into the 70s on 5 L.  Apparently was febrile with Tmax 102 per EMS for which she received Tylenol .  Supposedly initial blood pressure was markedly low in the ED but did respond to aggressive intravenous fluid resuscitation.    PT Comments  Pt in bed on arrival, reports OOB to chair previous day. Pt says sore all over, but pain in general a bit improved. Pt has been on room air since morning, sats 90% when checked. Pt able to practice STS transfers from elevated surface height. Pt able to start practicing lateral hop-stepping, good for AMB technique and for balance intervention, but he does fatigue quickly and has elevated dyspnea while recovering at EOB. Pt remains motivated as he progressively improves in activity tolerance, but remains quite weak/limited. Will continue to benefit from PT intervention.     If plan is discharge home, recommend the following: A little help with walking and/or transfers;A lot of help with bathing/dressing/bathroom;Assistance with cooking/housework;Direct supervision/assist for medications management;Direct supervision/assist for financial management;Help with stairs or ramp for entrance;Assist for transportation;Supervision due to cognitive status    Can travel by private vehicle     No  Equipment Recommendations  None recommended by PT    Recommendations for Other Services Rehab consult     Precautions / Restrictions       Mobility  Bed Mobility Overal bed mobility: Modified Independent Bed Mobility: Supine to Sit     Supine to sit: Supervision          Transfers Overall transfer level: Needs assistance Equipment used:  (BRW) Transfers: Sit to/from Stand Sit to Stand: Contact guard assist, From elevated surface (25 height)     Ambulation/Gait Ambulation/Gait assistance: Contact guard assist Gait Distance (Feet): 6 Feet Assistive device:  (BRW) Gait Pattern/deviations: Step-to pattern       General Gait Details: able to perform 2 bouts of side stepping, but fatigues quickly               Exercises Other Exercises Other Exercises: STS from 25 EOB x4 Other Exercises: latearl side stepping 73ft left, 72ft Right c BRW (2 bouts with rest between)         PT Goals (current goals can now be found in the care plan section) Acute Rehab PT Goals Patient Stated Goal: get stronger and back at home PT Goal Formulation: With patient Time For Goal Achievement: 12/05/23 Potential to Achieve Goals: Good Progress towards PT goals: Progressing toward goals    Frequency    Min 2X/week      PT Plan      AM-PAC PT 6 Clicks Mobility   Outcome Measure  Help needed turning from your back to your side while in a flat bed without using bedrails?: A Little Help needed moving from lying on your back to sitting on the side of a  flat bed without using bedrails?: A Little Help needed moving to and from a bed to a chair (including a wheelchair)?: A Lot Help needed standing up from a chair using your arms (e.g., wheelchair or bedside chair)?: A Lot Help needed to walk in hospital room?: A Lot Help needed climbing 3-5 steps with a railing? : A Lot 6 Click Score: 14    End of Session   Activity Tolerance:  Patient limited by fatigue Patient left: in bed;with call bell/phone within reach   PT Visit Diagnosis: Unsteadiness on feet (R26.81);Other abnormalities of gait and mobility (R26.89);Muscle weakness (generalized) (M62.81) Pain - Right/Left: Right Pain - part of body: Leg     Time: 4098-1191 PT Time Calculation (min) (ACUTE ONLY): 27 min  Charges:    $Therapeutic Activity: 23-37 mins PT General Charges $$ ACUTE PT VISIT: 1 Visit                    3:22 PM, 11/23/23 Dawn Eth, PT, DPT Physical Therapist - [facility]  [phone] (ASCOM)    Kalyan Barabas C 11/23/2023, 3:18 PM

## 2023-11-23 NOTE — Progress Notes (Signed)
 Occupational Therapy Treatment Patient Details Name: Martin Riddle MRN: [id number] DOB: 1970-11-10 Today's Date: 11/23/2023   History of present illness 53 year old male with extensive past medical history significant for insulin -dependent diabetes mellitus, hypertension, morbid obesity, PVD status post right AKA on 5/22 recent discharge skilled nursing facility.  Patient was brought back in from a skilled nursing facility with working diagnosis of serous sepsis of unknown source associated with a respiratory failure requiring initiation of NIPPV and severe AKI.  Apparently according to documentation the patient slid off the bed earlier at the SNF was noted to be confused and lethargic.  EMS was called.  Patient was hypoxic down into the 70s on 5 L.  Apparently was febrile with Tmax 102 per EMS for which she received Tylenol .  Supposedly initial blood pressure was markedly low in the ED but did respond to aggressive intravenous fluid resuscitation.   OT comments  Pt is supine in bed on arrival. Pleasant and agreeable to OT session. He reports mild pain, but is able to make it through session without further complaints. Pt performed bed mobility with MOD I. Pt required set up to supervision for UB bathing and grooming tasks seated at EOB. LB bathing performed with Mod A, as pt performs most of it sitting EOB then required Max A for buttocks/peri-care in standing at RW. He stood with CGA from significantly elevated bed height and maintained balance with heavy reliance on RW and CGA. Min A for gown change to manage tele. NT in to take vitals and blood sugar while pt taking rest break between tasks. Sp02 93% and HR stable with activity.  Pt returned to bed with all needs in place and will cont to require skilled acute OT services to maximize his safety and IND to return to PLOF.       If plan is discharge home, recommend the following:  Assist for transportation;Help with stairs or ramp for  entrance;Assistance with cooking/housework;A lot of help with walking and/or transfers;A little help with bathing/dressing/bathroom   Equipment Recommendations  Other (comment) (defer to next venue)    Recommendations for Other Services      Precautions / Restrictions Precautions Precautions: Fall Recall of Precautions/Restrictions: Impaired Restrictions Weight Bearing Restrictions Per Provider Order: Yes RLE Weight Bearing Per Provider Order: Non weight bearing Other Position/Activity Restrictions: NWB'ing R residual limb       Mobility Bed Mobility Overal bed mobility: Modified Independent       Supine to sit: Supervision Sit to supine: Supervision   General bed mobility comments: no physical assist, increased time/effort to reach EOB    Transfers Overall transfer level: Needs assistance   Transfers: Sit to/from Stand Sit to Stand: Contact guard assist, From elevated surface           General transfer comment: STS from elevated bed height with cues for hand placement and CGA; maintained balance in standing with CGA for peri-care     Balance Overall balance assessment: Needs assistance Sitting-balance support: Bilateral upper extremity supported Sitting balance-Leahy Scale: Good Sitting balance - Comments: no LOB for dynamic activity at EOB   Standing balance support: Bilateral upper extremity supported, Reliant on assistive device for balance Standing balance-Leahy Scale: Fair Standing balance comment: CGA at RW for static standing with UE dependence on RW                           ADL either performed or assessed with clinical judgement  ADL Overall ADL's : Needs assistance/impaired     Grooming: Wash/dry face;Wash/dry hands;Oral care;Applying deodorant;Set up;Sitting   Upper Body Bathing: Supervision/ safety;Sitting;Cueing for sequencing Upper Body Bathing Details (indicate cue type and reason): EOB Lower Body Bathing: Moderate  assistance;Minimal assistance;Sitting/lateral leans;Sit to/from stand Lower Body Bathing Details (indicate cue type and reason): able to Riddle majority of his LB sitting EOB, stood and required Max A for buttocks to be bathed Upper Body Dressing : Minimal assistance;Sitting Upper Body Dressing Details (indicate cue type and reason): doff/don gown around tele                        Extremity/Trunk Assessment              Vision       Perception     Praxis     Communication Communication Communication: No apparent difficulties   Cognition Arousal: Alert Behavior During Therapy: WFL for tasks assessed/performed Cognition: No family/caregiver present to determine baseline                               Following commands: Intact Following commands impaired: Follows multi-step commands with increased time      Cueing   Cueing Techniques: Verbal cues, Tactile cues  Exercises      Shoulder Instructions       General Comments      Pertinent Vitals/ Pain       Pain Assessment Pain Assessment: Faces Faces Pain Scale: Hurts a little bit Pain Location: residual limb Pain Intervention(s): Limited activity within patient's tolerance, Monitored during session, Repositioned (elevated)  Home Living                                          Prior Functioning/Environment              Frequency  Min 3X/week        Progress Toward Goals  OT Goals(current goals can now be found in the care plan section)  Progress towards OT goals: Progressing toward goals  Acute Rehab OT Goals Patient Stated Goal: get stronger OT Goal Formulation: With patient Time For Goal Achievement: 12/05/23 Potential to Achieve Goals: Good  Plan      Co-evaluation                 AM-PAC OT 6 Clicks Daily Activity     Outcome Measure   Help from another person eating meals?: None Help from another person taking care of personal  grooming?: None Help from another person toileting, which includes using toliet, bedpan, or urinal?: A Lot Help from another person bathing (including washing, rinsing, drying)?: A Little Help from another person to put on and taking off regular upper body clothing?: A Little Help from another person to put on and taking off regular lower body clothing?: A Lot 6 Click Score: 18    End of Session Equipment Utilized During Treatment: Rolling walker (2 wheels)  OT Visit Diagnosis: Pain;Other abnormalities of gait and mobility (R26.89);Unsteadiness on feet (R26.81);Muscle weakness (generalized) (M62.81) Pain - Right/Left: Right Pain - part of body: Leg   Activity Tolerance Patient tolerated treatment well   Patient Left in bed;with call bell/phone within reach;with bed alarm set   Nurse Communication Mobility status;Other (comment)  Time: 1610-9604 OT Time Calculation (min): 41 min  Charges: OT General Charges $OT Visit: 1 Visit OT Treatments $Self Care/Home Management : 38-52 mins  Jesus Poplin, OTR/L  11/23/23, 4:13 PM   Hayli Milligan E Hailie Searight 11/23/2023, 4:09 PM

## 2023-11-23 NOTE — Plan of Care (Signed)

## 2023-11-24 DIAGNOSIS — A419 Sepsis, unspecified organism: Secondary | ICD-10-CM | POA: Diagnosis not present

## 2023-11-24 DIAGNOSIS — R6520 Severe sepsis without septic shock: Secondary | ICD-10-CM | POA: Diagnosis not present

## 2023-11-24 LAB — CULTURE, BLOOD (ROUTINE X 2)
Culture: NO GROWTH
Culture: NO GROWTH

## 2023-11-24 LAB — GLUCOSE, CAPILLARY
Glucose-Capillary: 248 mg/dL — ABNORMAL HIGH (ref 70–99)
Glucose-Capillary: 176 mg/dL — ABNORMAL HIGH (ref 70–99)
Glucose-Capillary: 386 mg/dL — ABNORMAL HIGH (ref 70–99)
Glucose-Capillary: 279 mg/dL — ABNORMAL HIGH (ref 70–99)

## 2023-11-24 MED ORDER — TRAZODONE HCL 50 MG PO TABS
50.0000 mg | ORAL_TABLET | Freq: Every evening | ORAL | Status: DC | PRN
  Administered 2023-11-24: 50 mg via ORAL
  Filled 2023-11-24: qty 1

## 2023-11-24 MED ORDER — INSULIN GLARGINE-YFGN 100 UNIT/ML ~~LOC~~ SOLN
90.0000 [IU] | Freq: Two times a day (BID) | SUBCUTANEOUS | Status: DC
  Administered 2023-11-24 – 2023-11-25 (×2): 90 [IU] via SUBCUTANEOUS
  Filled 2023-11-24 (×3): qty 0.9

## 2023-11-24 NOTE — TOC Initial Note (Addendum)
 Transition of Care [facility]) - Initial/Assessment Note    Patient Details  Name: Martin Riddle MRN: [id number] Date of Birth: [birth year]  Transition of Care [facility]) CM/SW Contact:    Odilia Bennett, LCSW Phone Number: 11/24/2023, 10:59 AM  Clinical Narrative: CSW met with patient. No family at bedside. CSW introduced role and explained that discharge planning would be discussed. Patient discharged to [facility] SNF on 6/2 until he was readmitted on 6/12. SNF admissions coordinator confirmed he can return at discharge. Patient is aware and agreeable. CSW started insurance authorization. No further concerns. CSW will continue to follow patient for support and facilitate return to SNF once auth obtained.  1:33 pm: Auth still pending.  4:01 pm: Auth approved: [id number]. Valid 6/17-6/19. [facility] can accept patient tomorrow. MD, RN, patient, and wife aware.  Expected Discharge Plan: Skilled Nursing Facility Barriers to Discharge: Insurance Authorization   Patient Goals and CMS Choice     Choice offered to / list presented to : Patient      Expected Discharge Plan and Services     Post Acute Care Choice: Resumption of Svcs/PTA Provider Living arrangements for the past 2 months: Apartment                                      Prior Living Arrangements/Services Living arrangements for the past 2 months: Apartment Lives with:: Spouse Patient language and need for interpreter reviewed:: Yes Do you feel safe going back to the place where you live?: Yes      Need for Family Participation in Patient Care: Yes (Comment) Care giver support system in place?: Yes (comment)   Criminal Activity/Legal Involvement Pertinent to Current Situation/Hospitalization: No - Comment as needed  Activities of Daily Living   ADL Screening (condition at time of admission) Independently performs ADLs?: No Does the patient have a NEW difficulty with  bathing/dressing/toileting/self-feeding that is expected to last >3 days?: Yes (Initiates electronic notice to provider for possible OT consult) Does the patient have a NEW difficulty with getting in/out of bed, walking, or climbing stairs that is expected to last >3 days?: No Does the patient have a NEW difficulty with communication that is expected to last >3 days?: No Is the patient deaf or have difficulty hearing?: No Does the patient have difficulty seeing, even when wearing glasses/contacts?: No Does the patient have difficulty concentrating, remembering, or making decisions?: Yes  Permission Sought/Granted Permission sought to share information with : Facility Industrial/product designer granted to share information with : Yes, Verbal Permission Granted     Permission granted to share info w AGENCY: [facility] SNF        Emotional Assessment Appearance:: Appears stated age Attitude/Demeanor/Rapport: Engaged, Gracious Affect (typically observed): Accepting, Appropriate, Calm, Pleasant Orientation: : Oriented to Self, Oriented to Place, Oriented to  Time, Oriented to Situation Alcohol / Substance Use: Not Applicable Psych Involvement: No (comment)  Admission diagnosis:  Severe sepsis (HCC) [A41.9, R65.20] Patient Active Problem List   Diagnosis Date Noted   AKI (acute kidney injury) (HCC) 11/20/2023   Acute metabolic encephalopathy 11/20/2023   S/P AKA (above knee amputation) unilateral, right 10/29/2023 (HCC) 11/20/2023   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  11/05/2023 (HCC) 11/06/2023   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   Diabetic foot infection (HCC) 10/26/2023   HTN (hypertension) 10/26/2023   HLD (hyperlipidemia)  10/26/2023   Type 2 diabetes mellitus with peripheral neuropathy (HCC) 10/26/2023   Obesity, Class III, BMI 40-49.9 (morbid obesity) 10/26/2023   Severe sepsis (HCC) 10/26/2023   Depression 10/26/2023   Osteomyelitis of  right foot (HCC) 10/26/2023   Hypoglycemia 10/26/2023   Atherosclerosis of native arteries of the extremities with ulceration (HCC) 10/20/2023   Hypertension    Failure to attend appointment 07/07/2022   Cervical radiculopathy 01/07/2022   Lumbar radiculopathy 01/07/2022   Pain syndrome, chronic 01/07/2022   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   History of lumbar spinal fusion 01/07/2022   Chronic venous stasis dermatitis of both lower extremities    Cellulitis 05/30/2021   Multiple open wounds of lower extremity, initial encounter 05/30/2021   Acute respiratory failure with hypoxia (HCC) 02/19/2018   Severe recurrent major depression with psychotic features (HCC) 01/09/2017   Congenital flat foot 01/16/2015   DDD (degenerative disc disease), lumbar 01/16/2015   Diabetes mellitus, type 2 (HCC) 01/16/2015   Hypercholesteremia 01/16/2015   Gastro-esophageal reflux disease without esophagitis 01/16/2015   Peripheral neuropathy 01/16/2015   PCP:  Thomasene Flemings, NP Pharmacy:   [id number] [place], [place] - [address] AT [facility] OF [address] [place] [address] Phone: [phone] Fax: [phone]     Social Drivers of Health (SDOH) Social History: SDOH Screenings   Food Insecurity: Patient Declined (11/20/2023)  Housing: Unknown (11/20/2023)  Transportation Needs: Patient Declined (11/20/2023)  Utilities: Patient Declined (11/20/2023)  Alcohol Screen: Low Risk  (05/08/2017)  Social Connections: Patient Declined (10/27/2023)  Tobacco Use: High Risk (11/19/2023)   SDOH Interventions:     Readmission Risk Interventions     No data to display

## 2023-11-24 NOTE — Progress Notes (Signed)
 Physical Therapy Treatment Patient Details Name: Martin Riddle MRN: [id number] DOB: 14-Mar-1971 Today's Date: 11/24/2023   History of Present Illness 53 year old male with extensive past medical history significant for insulin -dependent diabetes mellitus, hypertension, morbid obesity, PVD status post right AKA on 5/22 recent discharge skilled nursing facility.  Patient was brought back in from a skilled nursing facility with working diagnosis of serous sepsis of unknown source associated with a respiratory failure requiring initiation of NIPPV and severe AKI.  Apparently according to documentation the patient slid off the bed earlier at the SNF was noted to be confused and lethargic.  EMS was called.  Patient was hypoxic down into the 70s on 5 L.  Apparently was febrile with Tmax 102 per EMS for which she received Tylenol .  Supposedly initial blood pressure was markedly low in the ED but did respond to aggressive intravenous fluid resuscitation.    PT Comments  Pt was seated EOB upon arrival. He is well known by author from previous admission. Pt agrees to OOB activity and remain motivated throughout. Pt was able to stand form elevated bed height to RW and hop to recliner placed ~ 5 ft away. Psychologist, occupational to have +2 assistance for additional safety. Once pt achieves standing, he only requires CGA to hop several feet form EOB to recliner. Dc recs remain appropriate to maximize his independence and safety with all ADLs.       If plan is discharge home, recommend the following: A little help with walking and/or transfers;A lot of help with bathing/dressing/bathroom;Assistance with cooking/housework;Direct supervision/assist for medications management;Direct supervision/assist for financial management;Help with stairs or ramp for entrance;Assist for transportation;Supervision due to cognitive status     Equipment Recommendations  None recommended by PT       Precautions / Restrictions  Precautions Precautions: Fall Restrictions Weight Bearing Restrictions Per Provider Order: Yes RLE Weight Bearing Per Provider Order: Non weight bearing Other Position/Activity Restrictions: NWB'ing R residual limb     Mobility  Bed Mobility  General bed mobility comments: pt was seated EOB upon arrival.    Transfers Overall transfer level: Needs assistance Equipment used: Rolling walker (2 wheels) (Bariatric) Transfers: Sit to/from Stand Sit to Stand: Contact guard assist, From elevated surface  General transfer comment: Author recommend +2 assistance when standing from lower recliner height. Once pt achieves standing, is easily able to hop step to /from EOB.    Ambulation/Gait Ambulation/Gait assistance: Contact guard assist Gait Distance (Feet): 5 Feet Assistive device: Rolling walker (2 wheels) (Bariatric) Gait Pattern/deviations: Step-to pattern Gait velocity: decreased  General Gait Details: from EOB to recliner placed ~ 5 ft away   Balance Overall balance assessment: Needs assistance Sitting-balance support: Bilateral upper extremity supported Sitting balance-Leahy Scale: Good Sitting balance - Comments: no LOB for dynamic activity at EOB   Standing balance support: Bilateral upper extremity supported, Reliant on assistive device for balance Standing balance-Leahy Scale: Fair Standing balance comment: reliant on BUE support on BRW with all dynamic standing activity.       Communication Communication Communication: No apparent difficulties  Cognition Arousal: Alert Behavior During Therapy: WFL for tasks assessed/performed   PT - Cognitive impairments: Orientation   Orientation impairments: Situation, Time    PT - Cognition Comments: Pt is alert but does endorse having a hard time with memory and has been very forgetful Following commands: Intact Following commands impaired: Follows multi-step commands inconsistently, Follows one step commands with increased  time    Cueing Cueing Techniques: Verbal  cues, Tactile cues         Pertinent Vitals/Pain Pain Assessment Pain Assessment: 0-10 Pain Score: 4  Pain Location: residual limb Pain Descriptors / Indicators: Throbbing Pain Intervention(s): Limited activity within patient's tolerance, Monitored during session, Premedicated before session, Repositioned, Ice applied     PT Goals (current goals can now be found in the care plan section) Acute Rehab PT Goals Patient Stated Goal: get stronger and back at home Progress towards PT goals: Progressing toward goals            Co-evaluation     PT goals addressed during session: Mobility/safety with mobility;Balance;Proper use of DME;Strengthening/ROM        AM-PAC PT 6 Clicks Mobility   Outcome Measure  Help needed turning from your back to your side while in a flat bed without using bedrails?: A Little Help needed moving from lying on your back to sitting on the side of a flat bed without using bedrails?: A Little Help needed moving to and from a bed to a chair (including a wheelchair)?: A Little Help needed standing up from a chair using your arms (e.g., wheelchair or bedside chair)?: A Lot Help needed to walk in hospital room?: A Lot Help needed climbing 3-5 steps with a railing? : A Lot 6 Click Score: 15    End of Session   Activity Tolerance: Patient limited by fatigue Patient left: in chair;with call bell/phone within reach;with chair alarm set Nurse Communication: Mobility status PT Visit Diagnosis: Unsteadiness on feet (R26.81);Other abnormalities of gait and mobility (R26.89);Muscle weakness (generalized) (M62.81) Pain - Right/Left: Right Pain - part of body: Leg     Time: 8119-1478 PT Time Calculation (min) (ACUTE ONLY): 15 min  Charges:    $Therapeutic Activity: 8-22 mins PT General Charges $$ ACUTE PT VISIT: 1 Visit                     Chester Costa PTA 11/24/23, 3:11 PM

## 2023-11-24 NOTE — Plan of Care (Signed)

## 2023-11-24 NOTE — Plan of Care (Signed)
  Problem: Clinical Measurements: Goal: Ability to maintain clinical measurements within normal limits will improve Outcome: Progressing Goal: Diagnostic test results will improve Outcome: Progressing   Problem: Activity: Goal: Risk for activity intolerance will decrease Outcome: Progressing   Problem: Coping: Goal: Level of anxiety will decrease Outcome: Progressing

## 2023-11-24 NOTE — Progress Notes (Signed)
 PROGRESS NOTE    Martin Riddle  ZOX:[id number] DOB: Jan 03, 1971 DOA: 11/19/2023 PCP: Thomasene Flemings, NP    Brief Narrative:  53 year old male with extensive past medical history significant for insulin -dependent diabetes mellitus, hypertension, morbid obesity, PVD status post right AKA on 5/22 recent discharge skilled nursing facility.  Patient was brought back in from a skilled nursing facility with working diagnosis of serous sepsis of unknown source associated with a respiratory failure requiring initiation of NIPPV and severe AKI.  Apparently according to documentation the patient slid off the bed earlier at the SNF was noted to be confused and lethargic.  EMS was called.  Patient was hypoxic down into the 70s on 5 L.  Apparently was febrile with Tmax 102 per EMS for which she received Tylenol .  Supposedly initial blood pressure was markedly low in the ED but did respond to aggressive intravenous fluid resuscitation.   Patient is currently on NIPPV, remains physically in ICU.  PCCM team consulted.  Patient is high risk for further respiratory decompensation and endotracheal intubation.  6/14: Looks much better today.  Awake and alert.  Answers questions appropriately.  Kidney function improved.   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  Possible contribution from heart failure but BNP is negative.  Difficult to assess volume status given body habitus.  Given recent AKA pulmonary embolism is high on  differential.  Left lower extremity duplex negative.  CTA pursued on 6/15 also negative.  Weaned down to 5 L nasal cannula. Plan:  Completed 5-day course of intravenous steroids.  Continue bronchodilators.  If there was concern for bronchoconstriction prior this has resolved.  Patient stable on room air.  Severe sepsis Altered mental status, hypotension, fever at skilled nursing.  Interestingly elevated procalcitonin with normal white blood cell count, lactic acid normal.  Source unknown.  Did have recent procedure.  Urine definitely infected. Plan: Continue empiric antibiotics.  Total 5-day course.  Last dose will be 6/18  Acute kidney injury Acute urinary retention Urinary tract infection Baseline kidney function appears normal 0 .79 Reached a maximum of 3.77, currently downtrending Normalized as of 6/15 Plan: Kidney function normalized.  Avoid nephrotoxins.  Foley catheter discontinued and patient voiding spontaneously.  Continue Flomax.  Acute metabolic encephalopathy Lethargic and confused.  Multifactorial related to hypoxia and sepsis Much improved.  Appears at baseline  PAD Status post right AKA 5/22 Wound evaluated.  Appears intact and not infected.   Outpatient follow-up  Type 2 diabetes mellitus with peripheral neuropathy Continue basal bolus regimen Sliding scale coverage Carb modified diet  Class III obesity This complicates overall care prognosis  DVT prophylaxis: SQ lovenox  Code Status: Full Family Communication:Wife at bedside 6/13, 6/16 Disposition Plan: Status is: Inpatient Remains inpatient appropriate because: Multiple acute issues as above   Level of care: Progressive  Consultants:  PCCM Palliative care  Procedures:  None  Antimicrobials: Cefepime      Subjective: Seen and examined.  Functional status appears to be back to baseline.  Patient aware and  agreeable to the plan to return to [facility].  Insurance authorization  initiated.  Objective: Vitals:   11/23/23 2312 11/24/23 0528 11/24/23 0823 11/24/23 1226  BP: 135/77 (!) 149/88 (!) 161/82 (!) 157/91  Pulse: 84 (!) 104 91 100  Resp: 20 20    Temp: 98 F (36.7 C) 98.3 F (36.8 C) 97.9 F (36.6 C) 98 F (36.7 C)  TempSrc:  Oral    SpO2: 91% 91% 95% 92%  Weight:      Height:        Intake/Output Summary (Last 24 hours) at 11/24/2023 1445 Last data filed at 11/24/2023 1300 Gross per 24 hour  Intake 580 ml  Output 3150 ml  Net -2570 ml   Filed Weights   11/20/23 0254 11/22/23 0400  Weight: (!) 167.4 kg (!) 160.3 kg    Examination:  General exam: No acute distress.  Appears chronically ill.  Appears older than stated age Respiratory system: Bibasilar crackles.  Normal work of breathing.  Room air Cardiovascular system: S1-S2, RRR, no murmurs, no pedal edema Gastrointestinal system: Obese, Soft, NT/ND, normal bowel sounds Central nervous system: Alert and oriented. No focal neurological deficits. Extremities: Left lower extremity AKA Skin: No rashes, lesions or ulcers Psychiatry: Judgement and insight appear normal. Mood & affect appropriate.     Data Reviewed: I have personally reviewed following labs and imaging studies  CBC: Recent Labs  Lab 11/19/23 2257 11/20/23 0453 11/21/23 0448 11/22/23 0452 11/23/23 0254  WBC 7.9 7.2 5.1 5.2 5.0  NEUTROABS 5.7 5.2  --   --   --   HGB 9.0* 8.5* 9.0* 8.8* 8.7*  HCT 29.1* 27.9* 29.5* 28.6* 28.5*  MCV 93.0 95.5 93.4 92.0 91.6  PLT 216 201 152 132* 138*   Basic Metabolic Panel: Recent Labs  Lab 11/19/23 2257 11/20/23 0453 11/21/23 0448 11/21/23 1047 11/22/23 0452 11/23/23 0254  NA 129* 130* 134*  --  132* 137  K 5.0 4.9 5.9* 4.9 4.6 4.0  CL 96* 95* 100  --  98 98  CO2 24 24 21*  --  27 29  GLUCOSE 81 114* 311*  --  298* 177*  BUN 37* 42* 44*  --  35* 29*  CREATININE 3.17* 3.77* 1.35*  --  0.82 0.68  CALCIUM  8.5* 8.1* 8.9  --  9.2 9.7   GFR: Estimated Creatinine Clearance:  177.7 mL/min (by C-G formula based on SCr of 0.68 mg/dL). Liver Function Tests: Recent Labs  Lab 11/19/23 2257 11/20/23 0453  AST 33 26  ALT 17 16  ALKPHOS 116 105  BILITOT 0.9 1.3*  PROT 6.6 6.9  ALBUMIN  3.1* 3.0*   No results for input(s): LIPASE, AMYLASE in the last 168 hours. No results for input(s): AMMONIA in the last 168 hours. Coagulation Profile: Recent Labs  Lab 11/19/23 2257 11/20/23 0442  INR 1.3* 1.4*   Cardiac Enzymes: No results for input(s): CKTOTAL, CKMB, CKMBINDEX, TROPONINI in the last 168 hours. BNP (last 3 results) No results for input(s): PROBNP in the last 8760 hours. HbA1C: No results for input(s): HGBA1C in the last 72 hours. CBG: Recent Labs  Lab 11/23/23 1207 11/23/23 1509 11/23/23 2103 11/24/23 0830 11/24/23 1226  GLUCAP 258* 190* 377* 248* 176*   Lipid Profile: No results for input(s): CHOL, HDL, LDLCALC, TRIG, CHOLHDL, LDLDIRECT in the last 72 hours. Thyroid Function Tests: No results for input(s): TSH, T4TOTAL, FREET4, T3FREE, THYROIDAB in the last 72 hours. Anemia Panel: No results for input(s): VITAMINB12, FOLATE, FERRITIN,  TIBC, IRON , RETICCTPCT in the last 72 hours. Sepsis Labs: Recent Labs  Lab 11/19/23 2257 11/19/23 2336 11/20/23 0034 11/20/23 1857  PROCALCITON  --  4.29  --   --   LATICACIDVEN 1.3  --  1.2 0.8    Recent Results (from the past 240 hours)  Culture, blood (Routine x 2)     Status: None   Collection Time: 11/19/23 10:56 PM   Specimen: BLOOD  Result Value Ref Range Status   Specimen Description BLOOD RIGHT ANTECUBITAL  Final   Special Requests   Final    BOTTLES DRAWN AEROBIC AND ANAEROBIC Blood Culture results may not be optimal due to an excessive volume of blood received in culture bottles   Culture   Final    NO GROWTH 5 DAYS Performed at [facility], [address] [place], [place] [address]    Report Status 11/24/2023 FINAL  Final   Culture, blood (Routine x 2)     Status: None   Collection Time: 11/19/23 10:57 PM   Specimen: Right Antecubital; Blood  Result Value Ref Range Status   Specimen Description RIGHT ANTECUBITAL  Final   Special Requests   Final    BOTTLES DRAWN AEROBIC AND ANAEROBIC Blood Culture results may not be optimal due to an inadequate volume of blood received in culture bottles   Culture   Final    NO GROWTH 5 DAYS Performed at [facility], [address] [place], [place] [address]    Report Status 11/24/2023 FINAL  Final  Urine Culture     Status: Abnormal   Collection Time: 11/19/23 11:37 PM   Specimen: Urine, Catheterized  Result Value Ref Range Status   Specimen Description   Final    URINE, CATHETERIZED Performed at [facility], [address] [place], [place] [address]    Special Requests   Final    NONE Performed at [facility], [address] [place], [place] [address]    Culture >=100,000 COLONIES/mL ESCHERICHIA COLI (A)  Final   Report Status 11/22/2023 FINAL  Final   Organism ID, Bacteria ESCHERICHIA COLI (A)  Final      Susceptibility   Escherichia coli - MIC*    AMPICILLIN  >=32 RESISTANT Resistant     CEFAZOLIN  >=64 RESISTANT Resistant     CEFEPIME  <=0.12 SENSITIVE Sensitive     CEFTRIAXONE  <=0.25 SENSITIVE Sensitive     CIPROFLOXACIN <=0.25 SENSITIVE Sensitive     GENTAMICIN <=1 SENSITIVE Sensitive     IMIPENEM <=0.25 SENSITIVE Sensitive     NITROFURANTOIN <=16 SENSITIVE Sensitive     TRIMETH/SULFA <=20 SENSITIVE Sensitive     AMPICILLIN /SULBACTAM >=32 RESISTANT Resistant     PIP/TAZO <=4 SENSITIVE Sensitive ug/mL    * >=100,000 COLONIES/mL ESCHERICHIA COLI  MRSA Next Gen by PCR, Nasal     Status: None   Collection Time: 11/20/23  3:34 AM   Specimen: Nasal Mucosa; Nasal Swab  Result Value Ref Range Status   MRSA by PCR Next Gen NOT DETECTED NOT DETECTED Final    Comment: (NOTE) The GeneXpert MRSA Assay (FDA approved for NASAL  specimens only), is one component of a comprehensive MRSA colonization surveillance program. It is not intended to diagnose MRSA infection nor to guide or monitor treatment for MRSA infections. Test performance is not FDA approved in patients less than 48 years old. Performed at [facility], [address] [place], [place] [address]          Radiology  Studies: No results found.       Scheduled Meds:  aspirin   81 mg Oral Daily   atorvastatin   40 mg Oral Daily   Chlorhexidine  Gluconate Cloth  6 each Topical Daily   clopidogrel   75 mg Oral Daily   DULoxetine   60 mg Oral Daily   enoxaparin  (LOVENOX ) injection  0.5 mg/kg Subcutaneous Q24H   feeding supplement (GLUCERNA SHAKE)  237 mL Oral TID BM   insulin  aspart  0-20 Units Subcutaneous TID WC   insulin  aspart  0-5 Units Subcutaneous QHS   insulin  aspart  10 Units Subcutaneous TID WC   insulin  glargine-yfgn  90 Units Subcutaneous BID   methocarbamol   500 mg Oral TID   pregabalin   200 mg Oral BID   tamsulosin  0.4 mg Oral Daily   Continuous Infusions:  cefTRIAXone  (ROCEPHIN )  IV 2 g (11/23/23 1706)     LOS: 4 days      Tiajuana Fluke, MD [facility]   If 7PM-7AM, please contact night-coverage  11/24/2023, 2:45 PM

## 2023-11-24 NOTE — NC FL2 (Signed)
 [place]  MEDICAID [id number] LEVEL OF CARE FORM     IDENTIFICATION  Patient Name: Martin Riddle Birthdate: Apr 20, 1971 Sex: male Admission Date (Current Location): 11/19/2023  [place] and [place] Number:  Chiropodist and Address:  [facility], [address] [place], [place] [address]      Provider Number: [id number]  Attending Physician Name and Address:  Tiajuana Fluke, MD  Relative Name and Phone Number:       Current Level of Care: Hospital Recommended Level of Care: Skilled Nursing Facility Prior Approval Number:    Date Approved/Denied:   PASRR Number: [id number] E. Expires 6/28.  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  11/05/2023 (HCC) 11/06/2023   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   Diabetic foot infection (HCC) 10/26/2023   HTN (hypertension) 10/26/2023   HLD (hyperlipidemia) 10/26/2023   Type 2 diabetes mellitus with peripheral neuropathy (HCC) 10/26/2023   Obesity, Class III, BMI 40-49.9 (morbid obesity) 10/26/2023   Severe sepsis (HCC) 10/26/2023   Depression 10/26/2023   Osteomyelitis of right foot (HCC) 10/26/2023   Hypoglycemia 10/26/2023   Atherosclerosis of native arteries of the extremities with ulceration (HCC) 10/20/2023   Hypertension    Failure to attend appointment 07/07/2022   Cervical radiculopathy 01/07/2022   Lumbar radiculopathy 01/07/2022   Pain syndrome, chronic 01/07/2022   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   History of lumbar spinal fusion 01/07/2022   Chronic venous stasis dermatitis of both lower extremities    Cellulitis 05/30/2021   Multiple open wounds of lower extremity, initial encounter 05/30/2021    Acute respiratory failure with hypoxia (HCC) 02/19/2018   Severe recurrent major depression with psychotic features (HCC) 01/09/2017   Congenital flat foot 01/16/2015   DDD (degenerative disc disease), lumbar 01/16/2015   Diabetes mellitus, type 2 (HCC) 01/16/2015   Hypercholesteremia 01/16/2015   Gastro-esophageal reflux disease without esophagitis 01/16/2015   Peripheral neuropathy 01/16/2015    Orientation RESPIRATION BLADDER Height & Weight     Self, Time, Situation, Place  Normal Continent Weight: (!) 353 lb 6.4 oz (160.3 kg) Height:  6' 6 (198.1 cm)  BEHAVIORAL SYMPTOMS/MOOD NEUROLOGICAL BOWEL NUTRITION STATUS   (None)  (None) Incontinent Diet (Carb modified)  AMBULATORY STATUS COMMUNICATION OF NEEDS Skin   Limited Assist Verbally Bruising, Other (Comment), Surgical wounds (Erythema/redness, rash. Diabetic ulcer on left toe: No dressing. Incision on right thigh: No dressing.)                       Personal Care Assistance Level of Assistance  Bathing, Feeding, Dressing Bathing Assistance: Limited assistance Feeding assistance: Limited assistance Dressing Assistance: Limited assistance     Functional Limitations Info  Sight, Hearing, Speech Sight Info: Adequate Hearing Info: Adequate Speech Info: Adequate    SPECIAL CARE FACTORS FREQUENCY  PT (By licensed PT), OT (By licensed OT)     PT Frequency: 5 x week OT Frequency: 5 x week            Contractures Contractures Info: Not present    Additional Factors Info  Code Status, Allergies Code Status Info: Full code Allergies Info: Morphine, Codeine           Current  Medications (11/24/2023):  This is the current hospital active medication list Current Facility-Administered Medications  Medication Dose Route Frequency Provider Last Rate Last Admin   acetaminophen  (TYLENOL ) tablet 650 mg  650 mg Oral Q6H PRN Duncan, Hazel V, MD   650 mg at 11/23/23 1700   Or   acetaminophen  (TYLENOL ) suppository 650  mg  650 mg Rectal Q6H PRN Duncan, Hazel V, MD       albuterol  (PROVENTIL ) (2.5 MG/3ML) 0.083% nebulizer solution 2.5 mg  2.5 mg Nebulization Q2H PRN Duncan, Hazel V, MD       aspirin  chewable tablet 81 mg  81 mg Oral Daily Duncan, Hazel V, MD   81 mg at 11/24/23 1610   atorvastatin  (LIPITOR) tablet 40 mg  40 mg Oral Daily Patel, Kishan S, RPH   40 mg at 11/24/23 9604   cefTRIAXone  (ROCEPHIN ) 2 g in sodium chloride  0.9 % 100 mL IVPB  2 g Intravenous Q24H Sreenath, Sudheer B, MD 200 mL/hr at 11/23/23 1706 2 g at 11/23/23 1706   Chlorhexidine  Gluconate Cloth 2 % PADS 6 each  6 each Topical Daily Margery Sheets B, MD   6 each at 11/23/23 0826   clopidogrel  (PLAVIX ) tablet 75 mg  75 mg Oral Daily Duncan, Hazel V, MD   75 mg at 11/24/23 0916   DULoxetine  (CYMBALTA ) DR capsule 60 mg  60 mg Oral Daily Duncan, Hazel V, MD   60 mg at 11/24/23 0916   enoxaparin  (LOVENOX ) injection 80 mg  0.5 mg/kg Subcutaneous Q24H Grubb, Rodney D, RPH   80 mg at 11/23/23 2142   feeding supplement (GLUCERNA SHAKE) (GLUCERNA SHAKE) liquid 237 mL  237 mL Oral TID BM Margery Sheets B, MD   237 mL at 11/23/23 2149   HYDROmorphone  (DILAUDID ) injection 0.5 mg  0.5 mg Intravenous Q3H PRN Margery Sheets B, MD   0.5 mg at 11/23/23 2143   insulin  aspart (novoLOG ) injection 0-20 Units  0-20 Units Subcutaneous TID WC Adalberto Acton, RPH   7 Units at 11/24/23 5409   insulin  aspart (novoLOG ) injection 0-5 Units  0-5 Units Subcutaneous QHS Lanetta Pion, MD   5 Units at 11/23/23 2142   insulin  aspart (novoLOG ) injection 10 Units  10 Units Subcutaneous TID WC Margery Sheets B, MD   10 Units at 11/24/23 0919   insulin  glargine-yfgn (SEMGLEE ) injection 90 Units  90 Units Subcutaneous BID Sreenath, Sudheer B, MD       methocarbamol  (ROBAXIN ) tablet 500 mg  500 mg Oral TID Sreenath, Sudheer B, MD   500 mg at 11/24/23 8119   ondansetron  (ZOFRAN ) tablet 4 mg  4 mg Oral Q6H PRN Duncan, Hazel V, MD       Or   ondansetron  (ZOFRAN )  injection 4 mg  4 mg Intravenous Q6H PRN Duncan, Hazel V, MD   4 mg at 11/23/23 0536   oxyCODONE  (Oxy IR/ROXICODONE ) immediate release tablet 5-10 mg  5-10 mg Oral Q4H PRN Sreenath, Sudheer B, MD   10 mg at 11/24/23 0029   pregabalin  (LYRICA ) capsule 200 mg  200 mg Oral BID Sreenath, Sudheer B, MD   200 mg at 11/24/23 0916   tamsulosin (FLOMAX) capsule 0.4 mg  0.4 mg Oral Daily Sreenath, Sudheer B, MD   0.4 mg at 11/24/23 1478   traZODone  (DESYREL ) tablet 50 mg  50 mg Oral QHS PRN Gideon Kussmaul, NP   50 mg at 11/24/23 0041     Discharge Medications: Please see discharge summary for  a list of discharge medications.  Relevant Imaging Results:  Relevant Lab Results:   Additional Information SS#: [id number]  Odilia Bennett, LCSW

## 2023-11-24 NOTE — Plan of Care (Signed)
                                                     Palliative Care Progress Note   Patient Name: Martin Riddle       Date: 11/24/2023 DOB: 1970/07/12  Age: 53 y.o. MRN#: [id number] Attending Physician: Tiajuana Fluke, MD Primary Care Physician: Thomasene Flemings, NP Admit Date: 11/19/2023  Extensive chart review completed including labs, vital signs, imaging, progress notes, orders, and available advanced directive documents from current and previous encounters.   Plan for patient to return to SNF at [facility] when medically ready.  Full code/full scope remain.  No acute palliative issues or needs today.  PMT will continue to follow and support, monitoring patient peripherally and re-engaging where appropriate.  Thank you for allowing the Palliative Medicine Team to assist in the care of Martin Riddle.  [name] L. Rebbeca Campi, DNP, FNP-BC Palliative Medicine Team  No charge

## 2023-11-24 NOTE — Inpatient Diabetes Management (Signed)
 Inpatient Diabetes Program Recommendations  AACE/ADA: New Consensus Statement on Inpatient Glycemic Control   Target Ranges:  Prepandial:   less than 140 mg/dL      Peak postprandial:   less than 180 mg/dL (1-2 hours)      Critically ill patients:  140 - 180 mg/dL     Latest Reference Range & Units 11/23/23 07:45 11/23/23 10:23 11/23/23 12:07 11/23/23 15:09 11/23/23 21:03 11/24/23 08:30  Glucose-Capillary 70 - 99 mg/dL 161 (H) 096 (H) 045 (H) 190 (H) 377 (H) 248 (H)   Review of Glycemic Control  Diabetes history: DM2 Outpatient Diabetes medications: Semglee  90 units BID, Novolog  30 units TID with meals, Novolog  0-20 units QID Current orders for Inpatient glycemic control: [place] 92 units BID, Novolog  10 units TID with meals, Novolog  0-20 units TID with meals, Novolog  0-5 units at bedtime; Solumedrol 40 mg daily  Inpatient Diabetes Program Recommendations:    Insulin : If steroids are continued as ordered, please consider increasing Semglee  to 94 units BID and meal coverage to Novolog  12 units TID with meals.  Thanks, Beacher Limerick, RN, MSN, CDCES Diabetes Coordinator Inpatient Diabetes Program [phone] (Team Pager from 8am to 5pm)

## 2023-11-25 DIAGNOSIS — R6520 Severe sepsis without septic shock: Secondary | ICD-10-CM | POA: Diagnosis not present

## 2023-11-25 DIAGNOSIS — A419 Sepsis, unspecified organism: Secondary | ICD-10-CM | POA: Diagnosis not present

## 2023-11-25 LAB — GLUCOSE, CAPILLARY: Glucose-Capillary: 154 mg/dL — ABNORMAL HIGH (ref 70–99)

## 2023-11-25 MED ORDER — OXYCODONE HCL 10 MG PO TABS: 10.0000 mg | ORAL_TABLET | ORAL | 0 refills | Status: AC | PRN

## 2023-11-25 MED ORDER — TAMSULOSIN HCL 0.4 MG PO CAPS: 0.4000 mg | ORAL_CAPSULE | Freq: Every day | ORAL | Status: DC

## 2023-11-25 NOTE — TOC Transition Note (Signed)
 Transition of Care [facility]) - Discharge Note   Patient Details  Name: Martin Riddle MRN: [id number] Date of Birth: 04/24/1971  Transition of Care [facility]) CM/SW Contact:  Carrissa Taitano C Roselynn Whitacre, RN Phone Number: 11/25/2023, 10:05 AM   Clinical Narrative:    Eldora Greet with Bernardo Bridgeman in admissions at [facility] Per facility patient admission confirmed for today. Patient assigned room # 224-B  Nurse will call report to [phone] Face sheet and medical necessity forms printed to the floor to be added to the EMS pack EMS arranged Third on the list. Discharge summary and SNF transfer report sent in HUB.  Nurse provided details of discharge  TOC signing off.      Barriers to Discharge: Insurance Authorization   Patient Goals and CMS Choice     Choice offered to / list presented to : Patient      Discharge Placement                       Discharge Plan and Services Additional resources added to the After Visit Summary for       Post Acute Care Choice: Resumption of Svcs/PTA Provider                               Social Drivers of Health (SDOH) Interventions SDOH Screenings   Food Insecurity: Patient Declined (11/20/2023)  Housing: Unknown (11/20/2023)  Transportation Needs: Patient Declined (11/20/2023)  Utilities: Patient Declined (11/20/2023)  Alcohol Screen: Low Risk  (05/08/2017)  Social Connections: Patient Declined (10/27/2023)  Tobacco Use: High Risk (11/19/2023)     Readmission Risk Interventions     No data to display

## 2023-11-25 NOTE — Plan of Care (Signed)
  Problem: Education: Goal: Knowledge of General Education information will improve Description: Including pain rating scale, medication(s)/side effects and non-pharmacologic comfort measures Outcome: Progressing   Problem: Health Behavior/Discharge Planning: Goal: Ability to manage health-related needs will improve Outcome: Progressing   Problem: Clinical Measurements: Goal: Ability to maintain clinical measurements within normal limits will improve Outcome: Progressing Goal: Will remain free from infection Outcome: Progressing Goal: Cardiovascular complication will be avoided Outcome: Progressing   Problem: Activity: Goal: Risk for activity intolerance will decrease Outcome: Progressing   Problem: Nutrition: Goal: Adequate nutrition will be maintained Outcome: Progressing   Problem: Coping: Goal: Level of anxiety will decrease Outcome: Progressing   Problem: Pain Managment: Goal: General experience of comfort will improve and/or be controlled Outcome: Progressing   Problem: Safety: Goal: Ability to remain free from injury will improve Outcome: Progressing   Problem: Skin Integrity: Goal: Risk for impaired skin integrity will decrease Outcome: Progressing

## 2023-11-25 NOTE — Discharge Summary (Signed)
 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  Patient slid off the bed earlier at the SNF and was noted to be confused and lethargic and EMS was called.  Temp with EMS was 102 for which he received IV Tylenol .  In the ED initial BP in the 40s/20s, responding to fluids, improving to 90s over 50s by admission.  Heart rate was in the 80s to 90s and he was tachypneic to the mid 20s.  Hypoxic to 85% requiring 6 L and subsequently BiPAP to maintain sats in the high 90s.  Afebrile. FiO2 to 60% with pH 7.35 and PCO249 VBG on BiPAP WBC, lactic acid 1.2 and procalcitonin 4.28. BNP 90.4. First troponin 47 Creatinine 3.17, up from baseline of 0.79 a couple weeks prior Hemoglobin 9 Which  is about his baseline EKG showing sinus at 89 with no ischemic or other changes.   Chest x-ray showed cardiomegaly with pulmonary vascular congestion The ED provider spoke with PCCM who did not think patient was a candidate for ICU and recommended hospitalist admission.  Admission requested.     Hospital Course:  Patient presents with fever, encephalopathy, hypotension, hypoxic respiratory failure, and AKI. Found to have e coli uti treated with 6 days cefepime >ceftriaxone . For AKI had foley that has been removed and passed TOV, started on flomax. For sepsis most likely source is uti, sepsis is resolved. Hypoxic respiratory failure also resolved, w/u negative (CTA negative for PE), was given steroids. Encephalopathy likely multifactorial 2/2 infection, hypoxia, and uremia. Other chronic medical problems stable. Now mentating clearly, kidney function normalized, breathing comfortably on room air, stable for discharge back to skilled nursing. Home clonidine  and losartan  were held on admission and we will continue to hold, will resume home hydrochlorothiazide . Advise check of BMP in about one week and to monitor for signs/symptoms urinary retention.   Procedures: none   Consultations: none  Discharge Exam: Vitals:   11/25/23 0402 11/25/23 0817  BP: (!) 164/94 (!) 145/85  Pulse: 94 71  Resp: 20   Temp: 98.1 F (36.7 C) 98.1 F (36.7 C)  SpO2: 95% 97%    General: NAD Cardiovascular: RRR Respiratory: CTAB  Discharge Instructions   Discharge Instructions     Diet - low sodium  heart healthy   Complete by: As directed    Increase activity slowly   Complete by: As directed    No wound care   Complete by: As directed       Allergies as of 11/25/2023       Reactions   Morphine Other (See Comments)   headache     A HEADACHE AND SWEATING AND LETHARGIC headache headache, ,  A HEADACHE AND SWEATING AND LETHARGIC headache   Morphine And Codeine Other (See Comments)   headache    Other Other (See Comments)   A HEADACHE AND SWEATING AND LETHARGIC    headache A HEADACHE AND SWEATING AND LETHARGIC, , headache        Medication List     STOP taking these medications    cloNIDine  0.2 MG tablet Commonly known as: CATAPRES    losartan  100 MG tablet Commonly known as: COZAAR        TAKE these medications    acetaminophen  500 MG tablet Commonly known as: TYLENOL  Take 1,000 mg by mouth every 6 (six) hours as needed for mild pain or moderate pain.   aspirin  81 MG chewable tablet Chew 1 tablet (81 mg total) by mouth daily.   atorvastatin  20 MG tablet Commonly known as: LIPITOR Take 1 tablet (20 mg total) by mouth daily.   busPIRone  30 MG tablet Commonly known as: BUSPAR  Take 30 mg by mouth 3 (three) times daily.   cetirizine 10 MG tablet Commonly known as: ZYRTEC Take 10 mg by mouth daily.   clopidogrel  75 MG tablet Commonly known as: PLAVIX  Take 1 tablet (75 mg total) by mouth daily.   cyanocobalamin  1000 MCG tablet Take 1 tablet (1,000 mcg total) by mouth daily.   DULoxetine  60 MG capsule Commonly known as: CYMBALTA  Take 60 mg by mouth daily.   hydrochlorothiazide  12.5 MG tablet Commonly known as: HYDRODIURIL  Take 12.5 mg by mouth daily.   insulin  aspart 100 UNIT/ML injection Commonly known as: novoLOG  Inject 30 Units into the skin 3 (three) times daily with meals.   insulin  aspart 100 UNIT/ML injection Commonly known as: novoLOG  Inject 0-20 Units into the skin 4 (four) times daily -  before meals and at bedtime. CBG 70 - 120: 0 units CBG 121 - 150: 3 units CBG 151 - 200: 4 units CBG 201 - 250: 7 units CBG 251 - 300: 11 units CBG 301 - 350: 15 units CBG 351 - 400: 20 units   insulin  glargine-yfgn 100 UNIT/ML injection Commonly known as: SEMGLEE  Inject 0.9 mLs (90 Units total) into the skin 2 (two) times daily.   iron  polysaccharides 150 MG capsule Commonly known as: NIFEREX Take 1 capsule (150 mg total) by mouth daily.    multivitamin with minerals Tabs tablet Take 1 tablet by mouth daily.   nitroGLYCERIN  0.4 MG SL tablet Commonly known as: NITROSTAT  Place under the tongue.   Oxycodone  HCl 10 MG Tabs Take 1 tablet (10 mg total) by mouth every 4 (four) hours as needed for up to 12 days.   polyethylene glycol 17 g packet Commonly known as: MIRALAX  / GLYCOLAX  Take 17 g by mouth daily.   pregabalin  200 MG capsule Commonly known as: LYRICA  Take 1 capsule (200 mg total) by mouth 2 (two) times daily.   rOPINIRole 1 MG tablet Commonly known as: REQUIP Take 1 mg by mouth at bedtime.   tamsulosin 0.4 MG Caps capsule Commonly known as: FLOMAX Take 1 capsule (0.4 mg total) by mouth daily.  Allergies  Allergen Reactions   Morphine Other (See Comments)    headache     A HEADACHE AND SWEATING AND LETHARGIC headache  headache, ,  A HEADACHE AND SWEATING AND LETHARGIC headache   Morphine And Codeine Other (See Comments)    headache   Other Other (See Comments)    A HEADACHE AND SWEATING AND LETHARGIC    headache  A HEADACHE AND SWEATING AND LETHARGIC, , headache    Contact information for follow-up providers     Thomasene Flemings, NP Follow up.   Specialty: Nurse Practitioner Contact information: [address] [place] [address] [phone]              Contact information for after-discharge care     Destination     [facility] .   Service: Skilled Nursing Contact information: [address] [place]  [phone]                      The results of significant diagnostics from this hospitalization (including imaging, microbiology, ancillary and laboratory) are listed below for reference.    Significant Diagnostic Studies: CT Angio Chest Pulmonary Embolism (PE) W or WO Contrast Result Date: 11/22/2023 CLINICAL DATA:  Pulmonary embolism (PE) suspected, high prob EXAM: CT ANGIOGRAPHY CHEST WITH CONTRAST TECHNIQUE:  Multidetector CT imaging of the chest was performed using the standard protocol during bolus administration of intravenous contrast. Multiplanar CT image reconstructions and MIPs were obtained to evaluate the vascular anatomy. RADIATION DOSE REDUCTION: This exam was performed according to the departmental dose-optimization program which includes automated exposure control, adjustment of the mA and/or kV according to patient size and/or use of iterative reconstruction technique. CONTRAST:  75mL OMNIPAQUE  IOHEXOL  350 MG/ML SOLN COMPARISON:  Oct 27, 2023 FINDINGS: Evaluation is limited by body habitus, arms down positioning and quantum mottle. Cardiovascular: There is suboptimal contrast opacification of the pulmonary arteries combined with above mentioned limitations which limits this exam. No central pulmonary embolism through the segmental pulmonary arteries. Predominately LEFT-sided coronary artery atherosclerotic calcifications. Heart is mildly enlarged. No pericardial effusion. Thoracic aorta is normal in course and caliber. Mediastinum/Nodes: Similar appearance of the thyroid. No axillary or mediastinal adenopathy. Lungs/Pleura: Trace bilateral pleural effusions. Patchy bibasilar airspace opacities. Mild centrilobular basilar predominant ground-glass opacities. Upper Abdomen: Splenomegaly. Prominence of the LEFT liver with lobulated liver margins. There are multiple prominent upper abdominal lymph nodes, suboptimally assessed due to streak artifact from arms down positioning. Representative lymph node of the porta hepatis measures 19 mm (series 5, image 174). Musculoskeletal: No chest wall abnormality. No acute or significant osseous findings. Butterfly vertebra T9. Review of the MIP images confirms the above findings. IMPRESSION: 1. Evaluation is limited by body habitus, arms down positioning and quantum mottle. No central pulmonary embolism through the segmental pulmonary arteries. If future clinical concern  for pulmonary embolism, patient may be a better candidate for a V/Q scan in the future. 2. Trace bilateral pleural effusions with patchy bibasilar airspace opacities and ground-glass centrilobular opacities. Differential considerations include atelectasis, edema or infection. 3. Splenomegaly with multiple prominent upper abdominal lymph nodes, suboptimally assessed due to streak artifact from arms down positioning. Recommend correlation with laboratory values. 4. Query early cirrhotic morphology of the liver. Aortic Atherosclerosis (ICD10-I70.0). Electronically Signed   By: Clancy Crimes M.D.   On: 11/22/2023 09:51   ECHOCARDIOGRAM COMPLETE Result Date: 11/21/2023    ECHOCARDIOGRAM REPORT   Patient Name:   Garrie CALVIN Kishbaugh Date  of Exam: 11/21/2023 Medical Rec #:  [id number]          Height:       78.0 in Accession #:    [id number]         Weight:       369.0 lb Date of Birth:  05-23-1971           BSA:          2.929 m Patient Age:    53 years           BP:           123/67 mmHg Patient Gender: M                  HR:           73 bpm. Exam Location:  [facility] Procedure: 2D Echo, Cardiac Doppler, Color Doppler and Intracardiac            Opacification Agent (Both Spectral and Color Flow Doppler were            utilized during procedure). Indications:     CHF I50.31  History:         Patient has prior history of Echocardiogram examinations, most                  recent 05/31/2021.  Sonographer:     Clenton Czech RDCS, FASE Referring Phys:  [id number] Tiajuana Fluke Diagnosing Phys: Antonette Batters MD  Sonographer Comments: Technically challenging study due to limited acoustic windows, suboptimal apical window, suboptimal subcostal window and patient is obese. Image acquisition challenging due to patient body habitus and Image acquisition challenging due to respiratory motion. IMPRESSIONS  1. Left ventricular ejection fraction, by estimation, is 60 to 65%. The left ventricle has normal function. The left  ventricle has no regional wall motion abnormalities. The left ventricular internal cavity size was moderately dilated. There is mild concentric left ventricular hypertrophy. Left ventricular diastolic parameters were normal.  2. Right ventricular systolic function is normal. The right ventricular size is normal.  3. The mitral valve is normal in structure. Trivial mitral valve regurgitation.  4. The aortic valve is normal in structure. Aortic valve regurgitation is not visualized. Conclusion(s)/Recommendation(s): Poor windows for evaluation of left ventricular function by transthoracic echocardiography. Would recommend an alternative means of evaluation. FINDINGS  Left Ventricle: Left ventricular ejection fraction, by estimation, is 60 to 65%. The left ventricle has normal function. The left ventricle has no regional wall motion abnormalities. Definity  contrast agent was given IV to delineate the left ventricular  endocardial borders. Strain was performed and the global longitudinal strain is indeterminate. Global longitudinal strain performed but not reported based on interpreter judgement due to suboptimal tracking. The left ventricular internal cavity size was  moderately dilated. There is mild concentric left ventricular hypertrophy. Left ventricular diastolic parameters were normal. Right Ventricle: The right ventricular size is normal. No increase in right ventricular wall thickness. Right ventricular systolic function is normal. Left Atrium: Left atrial size was normal in size. Right Atrium: Right atrial size was normal in size. Pericardium: There is no evidence of pericardial effusion. Mitral Valve: The mitral valve is normal in structure. Trivial mitral valve regurgitation. Tricuspid Valve: The tricuspid valve is normal in structure. Tricuspid valve regurgitation is not demonstrated. Aortic Valve: The aortic valve is normal in structure. Aortic valve regurgitation is not visualized. Aortic valve peak  gradient measures 10.8 mmHg. Pulmonic Valve: The pulmonic valve was normal in structure.  Pulmonic valve regurgitation is not visualized. Aorta: The ascending aorta was not well visualized. IAS/Shunts: No atrial level shunt detected by color flow Doppler. Additional Comments: 3D was performed not requiring image post processing on an independent workstation and was indeterminate.  LEFT VENTRICLE PLAX 2D LVIDd:         5.80 cm      Diastology LVIDs:         3.80 cm      LV e' medial:    6.74 cm/s LV PW:         1.30 cm      LV E/e' medial:  18.5 LV IVS:        1.25 cm      LV e' lateral:   11.20 cm/s LVOT diam:     2.20 cm      LV E/e' lateral: 11.2 LV SV:         87 LV SV Index:   30 LVOT Area:     3.80 cm  LV Volumes (MOD) LV vol d, MOD A2C: 155.0 ml LV vol d, MOD A4C: 175.0 ml LV vol s, MOD A2C: 57.5 ml LV vol s, MOD A4C: 81.5 ml LV SV MOD A2C:     97.5 ml LV SV MOD A4C:     175.0 ml LV SV MOD BP:      99.4 ml LEFT ATRIUM           Index LA diam:      3.50 cm 1.19 cm/m LA Vol (A4C): 61.9 ml 21.13 ml/m  AORTIC VALVE                 PULMONIC VALVE AV Area (Vmax): 2.80 cm     PV Vmax:        1.17 m/s AV Vmax:        164.00 cm/s  PV Peak grad:   5.5 mmHg AV Peak Grad:   10.8 mmHg    RVOT Peak grad: 4 mmHg LVOT Vmax:      121.00 cm/s LVOT Vmean:     74.200 cm/s LVOT VTI:       0.228 m  AORTA Ao Root diam: 3.60 cm Ao Asc diam:  3.20 cm MITRAL VALVE MV Area (PHT): 3.54 cm     SHUNTS MV Decel Time: 214 msec     Systemic VTI:  0.23 m MV E velocity: 125.00 cm/s  Systemic Diam: 2.20 cm MV A velocity: 121.00 cm/s MV E/A ratio:  1.03 Dwayne D Callwood MD Electronically signed by Antonette Batters MD Signature Date/Time: 11/21/2023/10:35:56 AM    Final    US  Venous Img Lower Unilateral Left (DVT) Result Date: 11/20/2023 CLINICAL DATA:  Left lower extremity pain and edema. EXAM: LEFT LOWER EXTREMITY VENOUS DOPPLER ULTRASOUND TECHNIQUE: Gray-scale sonography with graded compression, as well as color Doppler and duplex  ultrasound were performed to evaluate the lower extremity deep venous systems from the level of the common femoral vein and including the common femoral, femoral, profunda femoral, popliteal and calf veins including the posterior tibial, peroneal and gastrocnemius veins when visible. The superficial great saphenous vein was also interrogated. Spectral Doppler was utilized to evaluate flow at rest and with distal augmentation maneuvers in the common femoral, femoral and popliteal veins. COMPARISON:  None Available. FINDINGS: Contralateral Common Femoral Vein: Respiratory phasicity is normal and symmetric with the symptomatic side. No evidence of thrombus. Normal compressibility. Common Femoral Vein: No evidence of thrombus. Normal compressibility, respiratory phasicity and response  to augmentation. Saphenofemoral Junction: No evidence of thrombus. Normal compressibility and flow on color Doppler imaging. Profunda Femoral Vein: No evidence of thrombus. Normal compressibility and flow on color Doppler imaging. Femoral Vein: No evidence of thrombus. Normal compressibility, respiratory phasicity and response to augmentation. Popliteal Vein: No evidence of thrombus. Normal compressibility, respiratory phasicity and response to augmentation. Calf Veins: Left calf veins are centrally nonvisualized due to body habitus. Superficial Great Saphenous Vein: No evidence of thrombus. Normal compressibility. Venous Reflux:  None. Other Findings: No evidence of superficial thrombophlebitis or abnormal fluid collection. IMPRESSION: No evidence of left lower extremity deep venous thrombosis. Left calf veins are centrally nonvisualized due to body habitus. Electronically Signed   By: Erica Hau M.D.   On: 11/20/2023 16:33   DG Chest Port 1 View Result Date: 11/19/2023 CLINICAL DATA:  Altered mental status, fall, questionable sepsis EXAM: PORTABLE CHEST 1 VIEW COMPARISON:  10/27/2023 FINDINGS: Stable cardiomegaly. Pulmonary  vascular congestion. No focal consolidation, pleural effusion, or pneumothorax. No displaced rib fractures. IMPRESSION: Cardiomegaly with pulmonary vascular congestion. Electronically Signed   By: Rozell Cornet M.D.   On: 11/19/2023 23:26   PERIPHERAL VASCULAR CATHETERIZATION Result Date: 11/05/2023 See surgical note for result.  DG Abd 1 View Result Date: 10/27/2023 CLINICAL DATA:  OG tube placement EXAM: ABDOMEN - 1 VIEW COMPARISON:  02/19/2018 FINDINGS: Enteric tube tip slightly low lying in the left mid to lower quadrant presumably within body of stomach. Visualized gas pattern is unremarkable IMPRESSION: Enteric tube tip slightly low lying in the left mid to lower quadrant presumably within body of stomach. Electronically Signed   By: Esmeralda Hedge M.D.   On: 10/27/2023 19:31   DG Chest Port 1 View Result Date: 10/27/2023 CLINICAL DATA:  ET and OG tube placement EXAM: PORTABLE CHEST 1 VIEW COMPARISON:  10/27/2023 FINDINGS: Interval intubation, tip of the endotracheal tube is about 4.6 cm superior to carina. Enteric tube tip below the diaphragm but incompletely assessed. Enlarged cardiomediastinal silhouette with vascular congestion and worsening airspace disease at the left base. Possible small left effusion. No pneumothorax IMPRESSION: 1. Interval intubation, tip of the endotracheal tube about 4.6 cm superior to carina. Enteric tube tip below the diaphragm but incompletely assessed. 2. Enlarged cardiomediastinal silhouette with vascular congestion and worsening airspace disease and/or effusion at the left base. Electronically Signed   By: Esmeralda Hedge M.D.   On: 10/27/2023 19:30   CT Angio Chest Pulmonary Embolism (PE) W or WO Contrast Result Date: 10/27/2023 CLINICAL DATA:  Diabetic right foot ulceration and hypoxia. EXAM: CT ANGIOGRAPHY CHEST WITH CONTRAST TECHNIQUE: Multidetector CT imaging of the chest was performed using the standard protocol during bolus administration of intravenous  contrast. Multiplanar CT image reconstructions and MIPs were obtained to evaluate the vascular anatomy. RADIATION DOSE REDUCTION: This exam was performed according to the departmental dose-optimization program which includes automated exposure control, adjustment of the mA and/or kV according to patient size and/or use of iterative reconstruction technique. CONTRAST:  OMNIPAQUE  IOHEXOL  350 MG/ML SOLN COMPARISON:  None Available. FINDINGS: Cardiovascular: Pulmonary arterial opacification is not optimal on the study. There is no evidence of significant central or lobar pulmonary embolism. The main pulmonary artery is top-normal in caliber measuring approximately 3 cm. The thoracic aorta is normal in caliber. No significant aortic atherosclerosis. The heart size is normal. No pericardial fluid identified. Calcified coronary artery plaque present. Mediastinum/Nodes: Mild relative enlargement of the right lobe of the thyroid gland compared to the left with dystrophic calcifications in  the right lobe and a small 6 mm low-density nodule in the superior right lobe. No lymphadenopathy identified. Lungs/Pleura: No overt pulmonary edema, pleural fluid or pneumothorax. Bibasilar atelectasis and scarring. No focal masses identified. Upper Abdomen: No acute abnormality. Musculoskeletal: No chest wall abnormality. No acute or significant osseous findings. Review of the MIP images confirms the above findings. IMPRESSION: 1. Suboptimal peripheral pulmonary arterial opacification. No evidence of significant central or lobar pulmonary embolism. 2. Bibasilar atelectasis and scarring. 3. Calcified coronary artery plaque. 4. Mild relative enlargement of the right lobe of the thyroid gland compared to the left with dystrophic calcifications in the right lobe and a small 6 mm low-density nodule in the superior right lobe. Not clinically significant; no follow-up imaging recommended (ref: J Am Coll Radiol. 2015 Feb;12(2): 143-50).  Electronically Signed   By: Erica Hau M.D.   On: 10/27/2023 11:08   DG Chest Port 1 View Result Date: 10/27/2023 CLINICAL DATA:  Acute hypoxic respiratory failure. EXAM: PORTABLE CHEST 1 VIEW COMPARISON:  May 30, 2021. FINDINGS: The heart size and mediastinal contours are within normal limits. Stable minimal left basilar scarring. No acute pulmonary disease is noted. The visualized skeletal structures are unremarkable. IMPRESSION: No active disease. Electronically Signed   By: Rosalene Colon M.D.   On: 10/27/2023 10:50   DG Foot 2 Views Right Result Date: 10/26/2023 CLINICAL DATA:  Foot infection EXAM: RIGHT FOOT - 2 VIEW COMPARISON:  Right foot x-ray 05/30/2021. FINDINGS: There is diffuse soft tissue swelling of the foot, particularly the first toe. There is soft tissue gas surrounding the first proximal phalanx. There is large soft tissue ulceration of the distal first toe. There are marked erosive changes of the mid and distal aspect of the first distal phalanx with fragmentation of the remaining proximal aspect of the first distal phalanx. This extends to the first interphalangeal joint space. The first proximal phalanx appears within normal limits. IMPRESSION: 1. Large soft tissue ulceration of the distal first toe with marked erosive changes of the first distal phalanx extending to the interphalangeal joint space. 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 2. Soft tissue gas surrounding the first proximal phalanx, concerning for infection with gas-forming organism. Electronically Signed   By: Tyron Gallon M.D.   On: 10/26/2023 22:11    Microbiology: Recent Results (from the past 240 hours)  Culture, blood (Routine x 2)     Status: None   Collection Time: 11/19/23 10:56 PM   Specimen: BLOOD  Result Value Ref Range Status   Specimen Description BLOOD RIGHT ANTECUBITAL  Final   Special Requests   Final    BOTTLES DRAWN AEROBIC AND ANAEROBIC Blood Culture results may  not be optimal due to an excessive volume of blood received in culture bottles   Culture   Final    NO GROWTH 5 DAYS Performed at [facility], [address] [place], [place] [address]    Report Status 11/24/2023 FINAL  Final  Culture, blood (Routine x 2)     Status: None   Collection Time: 11/19/23 10:57 PM   Specimen: Right Antecubital; Blood  Result Value Ref Range Status   Specimen Description RIGHT ANTECUBITAL  Final   Special Requests   Final    BOTTLES DRAWN AEROBIC AND ANAEROBIC Blood Culture results may not be optimal due to an inadequate volume of blood received in culture bottles   Culture   Final    NO GROWTH 5 DAYS Performed at [facility], [address] [place], [place] [address]    Report Status 11/24/2023 FINAL  Final  Urine Culture     Status: Abnormal   Collection Time: 11/19/23 11:37 PM   Specimen: Urine, Catheterized  Result Value Ref Range Status   Specimen Description   Final    URINE, CATHETERIZED Performed at [facility], [address] [place], [place] [address]    Special Requests   Final    NONE Performed at [facility], [address] [place], [place] [address]    Culture >=100,000 COLONIES/mL ESCHERICHIA COLI (A)  Final   Report Status 11/22/2023 FINAL  Final   Organism ID, Bacteria ESCHERICHIA COLI (A)  Final      Susceptibility   Escherichia coli - MIC*    AMPICILLIN  >=32 RESISTANT Resistant     CEFAZOLIN  >=64 RESISTANT Resistant     CEFEPIME  <=0.12 SENSITIVE Sensitive     CEFTRIAXONE  <=0.25 SENSITIVE Sensitive     CIPROFLOXACIN <=0.25 SENSITIVE Sensitive     GENTAMICIN <=1 SENSITIVE Sensitive     IMIPENEM <=0.25 SENSITIVE Sensitive     NITROFURANTOIN <=16 SENSITIVE Sensitive     TRIMETH/SULFA <=20 SENSITIVE Sensitive     AMPICILLIN /SULBACTAM >=32 RESISTANT Resistant     PIP/TAZO <=4 SENSITIVE Sensitive ug/mL    * >=100,000 COLONIES/mL ESCHERICHIA COLI  MRSA Next Gen by PCR, Nasal      Status: None   Collection Time: 11/20/23  3:34 AM   Specimen: Nasal Mucosa; Nasal Swab  Result Value Ref Range Status   MRSA by PCR Next Gen NOT DETECTED NOT DETECTED Final    Comment: (NOTE) The GeneXpert MRSA Assay (FDA approved for NASAL specimens only), is one component of a comprehensive MRSA colonization surveillance program. It is not intended to diagnose MRSA infection nor to guide or monitor treatment for MRSA infections. Test performance is not FDA approved in patients less than 80 years old. Performed at [facility], [address] [place], [place] [address]      Labs: Basic Metabolic Panel: Recent Labs  Lab 11/19/23 2257 11/20/23 0453 11/21/23 0448 11/21/23 1047 11/22/23 0452 11/23/23 0254  NA 129* 130* 134*  --  132* 137  K 5.0 4.9 5.9* 4.9 4.6 4.0  CL 96* 95* 100  --  98 98  CO2 24 24 21*  --  27 29  GLUCOSE 81 114* 311*  --  298* 177*  BUN 37* 42* 44*  --  35* 29*  CREATININE 3.17* 3.77* 1.35*  --  0.82 0.68  CALCIUM  8.5* 8.1* 8.9  --  9.2 9.7   Liver Function Tests: Recent Labs  Lab 11/19/23 2257 11/20/23 0453  AST 33 26  ALT 17 16  ALKPHOS 116 105  BILITOT 0.9 1.3*  PROT 6.6 6.9  ALBUMIN  3.1* 3.0*   No results for input(s): LIPASE, AMYLASE in the last 168 hours. No results for input(s): AMMONIA in the last 168 hours. CBC: Recent Labs  Lab 11/19/23 2257 11/20/23 0453 11/21/23 0448 11/22/23 0452 11/23/23 0254  WBC 7.9 7.2 5.1 5.2 5.0  NEUTROABS 5.7 5.2  --   --   --   HGB 9.0* 8.5* 9.0* 8.8* 8.7*  HCT 29.1* 27.9* 29.5* 28.6* 28.5*  MCV 93.0 95.5 93.4 92.0 91.6  PLT 216 201 152 132* 138*   Cardiac Enzymes: No results for input(s): CKTOTAL, CKMB, CKMBINDEX, TROPONINI in the last 168 hours. BNP: BNP (last 3 results) Recent Labs    10/27/23 0459 11/20/23 0034  BNP  67.3 90.4    ProBNP (last 3 results) No results for input(s): PROBNP in the last 8760 hours.  CBG: Recent Labs  Lab 11/24/23 0830  11/24/23 1226 11/24/23 1539 11/24/23 2043 11/25/23 0801  GLUCAP 248* 176* 386* 279* 154*       Signed:  Raymonde Calico MD.  [facility] 11/25/2023, 10:08 AM

## 2023-11-26 ENCOUNTER — Ambulatory Visit (INDEPENDENT_AMBULATORY_CARE_PROVIDER_SITE_OTHER): Admitting: Vascular Surgery

## 2023-11-30 ENCOUNTER — Ambulatory Visit (INDEPENDENT_AMBULATORY_CARE_PROVIDER_SITE_OTHER): Admitting: Vascular Surgery

## 2023-11-30 ENCOUNTER — Encounter (INDEPENDENT_AMBULATORY_CARE_PROVIDER_SITE_OTHER): Payer: Self-pay | Admitting: Vascular Surgery

## 2023-11-30 VITALS — BP 129/73 | HR 75

## 2023-11-30 DIAGNOSIS — Z794 Long term (current) use of insulin: Secondary | ICD-10-CM

## 2023-11-30 DIAGNOSIS — I7025 Atherosclerosis of native arteries of other extremities with ulceration: Secondary | ICD-10-CM

## 2023-11-30 DIAGNOSIS — E11610 Type 2 diabetes mellitus with diabetic neuropathic arthropathy: Secondary | ICD-10-CM

## 2023-11-30 DIAGNOSIS — I872 Venous insufficiency (chronic) (peripheral): Secondary | ICD-10-CM

## 2023-11-30 DIAGNOSIS — I10 Essential (primary) hypertension: Secondary | ICD-10-CM

## 2023-11-30 NOTE — Progress Notes (Signed)
 Subjective:    Patient ID: Martin Riddle, male    DOB: 1971-03-16, 53 y.o.   MRN: [id number] No chief complaint on file.   Martin Riddle is a 53 year old male who is now status postop 3 weeks from a right above-the-knee amputation.  He presents to clinic for his 3 weeks postop check with staple removal.  Patient's wife is at his side today.  Both endorse he was in the hospital last week for some shortness of breath.  He underwent CT scan of his chest for PE protocol that was negative.  He was also noted to have some left lower extremity cellulitis.  He is also 2 weeks postop from left lower extremity revascularization with stent placement.  He does endorse some swelling due to reperfusion.  He endorses he has not been wrapping his stump to help with edema.  He is currently not living at home he is still in the nursing rehab facility.  He requested a shoe for his left foot.    Review of Systems  Constitutional: Negative.   Cardiovascular:  Positive for leg swelling.       Left lower extremity with leg swelling due to reperfusion syndrome.  Musculoskeletal:  Positive for gait problem.       Due to right above-the-knee amputation.  Skin:  Positive for color change.       Positive for color change to his left lower extremity due to reperfusion syndrome.  All other systems reviewed and are negative.      Objective:   Physical Exam Vitals reviewed.  Constitutional:      Appearance: Normal appearance. He is obese.  HENT:     Head: Normocephalic.   Eyes:     Pupils: Pupils are equal, round, and reactive to light.    Cardiovascular:     Rate and Rhythm: Normal rate and regular rhythm.     Pulses: Normal pulses.     Heart sounds: Normal heart sounds.  Pulmonary:     Effort: Pulmonary effort is normal.     Breath sounds: Normal breath sounds.  Abdominal:     General: There is distension.     Palpations: Abdomen is soft.   Musculoskeletal:        General: Swelling and tenderness  present.     Left lower leg: Edema present.   Skin:    General: Skin is warm and dry.     Capillary Refill: Capillary refill takes 2 to 3 seconds.     Findings: Erythema present.   Neurological:     General: No focal deficit present.     Mental Status: He is alert and oriented to person, place, and time. Mental status is at baseline.     Gait: Gait abnormal.   Psychiatric:        Mood and Affect: Mood normal.        Behavior: Behavior normal.        Thought Content: Thought content normal.        Judgment: Judgment normal.     BP 129/73   Pulse 75   Past Medical History:  Diagnosis Date   Diabetes mellitus without complication (HCC)    Hypercholesteremia    Hypertension    Pancreatitis     Social History   Socioeconomic History   Marital status: Married    Spouse name: Not on file   Number of children: Not on file   Years of education: Not on file   Highest  education level: Not on file  Occupational History   Not on file  Tobacco Use   Smoking status: Former    Types: Cigarettes   Smokeless tobacco: Current    Types: Chew  Vaping Use   Vaping status: Never Used  Substance and Sexual Activity   Alcohol use: No    Alcohol/week: 0.0 standard drinks of alcohol   Drug use: No   Sexual activity: Yes    Partners: Male    Birth control/protection: None  Other Topics Concern   Not on file  Social History Narrative   Not on file   Social Drivers of Health   Financial Resource Strain: Not on file  Food Insecurity: Patient Declined (11/20/2023)   Hunger Vital Sign    Worried About Running Out of Food in the Last Year: Patient declined    Ran Out of Food in the Last Year: Patient declined  Transportation Needs: Patient Declined (11/20/2023)   PRAPARE - Administrator, Civil Service (Medical): Patient declined    Lack of Transportation (Non-Medical): Patient declined  Physical Activity: Not on file  Stress: Not on file  Social Connections: Patient  Declined (10/27/2023)   Social Connection and Isolation Panel    Frequency of Communication with Friends and Family: Patient declined    Frequency of Social Gatherings with Friends and Family: Patient declined    Attends Religious Services: Patient declined    Active Member of Clubs or Organizations: Patient declined    Attends Banker Meetings: Patient declined    Marital Status: Patient declined  Intimate Partner Violence: Not At Risk (11/20/2023)   Humiliation, Afraid, Rape, and Kick questionnaire    Fear of Current or Ex-Partner: No    Emotionally Abused: No    Physically Abused: No    Sexually Abused: No    Past Surgical History:  Procedure Laterality Date   AMPUTATION Right 10/28/2023   Procedure: AMPUTATION, ABOVE KNEE;  Surgeon: Jama Cordella MATSU, MD;  Location: [facility] ORS;  Service: Vascular;  Laterality: Right;   ANKLE SURGERY Left 2001,2003   BACK SURGERY  2003,2012   LOWER EXTREMITY ANGIOGRAPHY Left 11/05/2023   Procedure: Lower Extremity Angiography;  Surgeon: Marea Selinda RAMAN, MD;  Location: [facility] IN[facility];  Service: Cardiovascular;  Laterality: Left;    Family History  Problem Relation Age of Onset   Heart disease Mother    Diabetes Mother    Diabetes Sister     Allergies  Allergen Reactions   Morphine Other (See Comments)    headache     A HEADACHE AND SWEATING AND LETHARGIC headache  headache, ,  A HEADACHE AND SWEATING AND LETHARGIC headache   Morphine And Codeine Other (See Comments)    headache   Other Other (See Comments)    A HEADACHE AND SWEATING AND LETHARGIC    headache  A HEADACHE AND SWEATING AND LETHARGIC, , headache       Latest Ref Rng & Units 11/23/2023    2:54 AM 11/22/2023    4:52 AM 11/21/2023    4:48 AM  CBC  WBC 4.0 - 10.5 K/uL 5.0  5.2  5.1   Hemoglobin 13.0 - 17.0 g/dL 8.7  8.8  9.0   Hematocrit 39.0 - 52.0 % 28.5  28.6  29.5   Platelets 150 - 400 K/uL 138  132  152       CMP     Component Value Date/Time    NA 137 11/23/2023 0254  NA 135 (L) 03/15/2014 0339   K 4.0 11/23/2023 0254   K 3.1 (L) 03/15/2014 0339   CL 98 11/23/2023 0254   CL 104 03/15/2014 0339   CO2 29 11/23/2023 0254   CO2 24 03/15/2014 0339   GLUCOSE 177 (H) 11/23/2023 0254   GLUCOSE 127 (H) 03/15/2014 0339   BUN 29 (H) 11/23/2023 0254   BUN 9 03/15/2014 0339   CREATININE 0.68 11/23/2023 0254   CREATININE 0.64 03/15/2014 0339   CALCIUM  9.7 11/23/2023 0254   CALCIUM  8.4 (L) 03/15/2014 0339   PROT 6.9 11/20/2023 0453   PROT 8.8 (H) 03/11/2014 0620   ALBUMIN  3.0 (L) 11/20/2023 0453   ALBUMIN  3.2 (L) 03/11/2014 0620   AST 26 11/20/2023 0453   AST 42 (H) 03/11/2014 0620   ALT 16 11/20/2023 0453   ALT SEE COMMENT 03/11/2014 0620   ALKPHOS 105 11/20/2023 0453   ALKPHOS 90 03/11/2014 0620   BILITOT 1.3 (H) 11/20/2023 0453   BILITOT 0.8 03/11/2014 0620   GFRNONAA >60 11/23/2023 0254   GFRNONAA >60 03/15/2014 0339     No results found.     Assessment & Plan:   1. Atherosclerosis of native arteries of the extremities with ulceration (HCC) (Primary) Patient is now postop week 3 from a right above-the-knee amputation for right lower extremity ischemia with ulcerations.  Patient's right above-the-knee amputation stump remains swollen with staples intact.  Staples removed today.  Incision line is well-healed.  I encouraged the patient to continue to wrap with Ace bandages on a daily basis to help remove swelling.  Patient continues to reside in nursing/rehab at this time.  Orders were sent to order him a shoe for his left foot and continue wrapping his right AKA stump.  [facility] was also made today for the patient.  Patient will follow-up in 1 week for left lower extremity check  2. Venous insufficiency of left leg Patient is now 2 weeks postoperative from revascularization with stent placement of left lower extremity.  Positive for reperfusion edema from the calf to his foot which remains swollen with +2  edema.  Patient was hospitalized last week and treated for some cellulitis to the left lower extremity via IV antibiotics.  Currently not on any oral antibiotics as his left lower extremity appears to be healing.  Patient was placed in a left Unna boot to help with some of the swelling and control skin erythema and cellulitis.  Patient will follow-up in 1 week to have Unna boot removed and checked again.  3. Primary hypertension Continue antihypertensive medications as already ordered, these medications have been reviewed and there are no changes at this time.  4. Type 2 diabetes mellitus with diabetic neuropathic arthropathy, with long-term current use of insulin  (HCC) Continue hypoglycemic medications as already ordered, these medications have been reviewed and there are no changes at this time.  Hgb A1C to be monitored as already arranged by primary service   Current Outpatient Medications on File Prior to Visit  Medication Sig Dispense Refill   acetaminophen  (TYLENOL ) 500 MG tablet Take 1,000 mg by mouth every 6 (six) hours as needed for mild pain or moderate pain.     aspirin  81 MG chewable tablet Chew 1 tablet (81 mg total) by mouth daily. 30 tablet 0   atorvastatin  (LIPITOR) 20 MG tablet Take 1 tablet (20 mg total) by mouth daily. 90 tablet 1   busPIRone  (BUSPAR ) 30 MG tablet Take 30 mg by mouth 3 (three) times  daily.     cetirizine (ZYRTEC) 10 MG tablet Take 10 mg by mouth daily.     clopidogrel  (PLAVIX ) 75 MG tablet Take 1 tablet (75 mg total) by mouth daily. 30 tablet 0   cyanocobalamin  1000 MCG tablet Take 1 tablet (1,000 mcg total) by mouth daily. 30 tablet 0   DULoxetine  (CYMBALTA ) 60 MG capsule Take 60 mg by mouth daily.     hydrochlorothiazide  (HYDRODIURIL ) 12.5 MG tablet Take 12.5 mg by mouth daily.     insulin  aspart (NOVOLOG ) 100 UNIT/ML injection Inject 30 Units into the skin 3 (three) times daily with meals.     insulin  aspart (NOVOLOG ) 100 UNIT/ML injection Inject 0-20  Units into the skin 4 (four) times daily -  before meals and at bedtime. CBG 70 - 120: 0 units CBG 121 - 150: 3 units CBG 151 - 200: 4 units CBG 201 - 250: 7 units CBG 251 - 300: 11 units CBG 301 - 350: 15 units CBG 351 - 400: 20 units     insulin  glargine-yfgn (SEMGLEE ) 100 UNIT/ML injection Inject 0.9 mLs (90 Units total) into the skin 2 (two) times daily.     iron  polysaccharides (NIFEREX) 150 MG capsule Take 1 capsule (150 mg total) by mouth daily. 30 capsule 0   Multiple Vitamin (MULTIVITAMIN WITH MINERALS) TABS tablet Take 1 tablet by mouth daily. 90 tablet 1   nitroGLYCERIN  (NITROSTAT ) 0.4 MG SL tablet Place under the tongue.     Oxycodone  HCl 10 MG TABS Take 1 tablet (10 mg total) by mouth every 4 (four) hours as needed for up to 12 days. 30 tablet 0   polyethylene glycol (MIRALAX  / GLYCOLAX ) packet Take 17 g by mouth daily. 30 each 0   pregabalin  (LYRICA ) 200 MG capsule Take 1 capsule (200 mg total) by mouth 2 (two) times daily. 60 capsule 0   rOPINIRole (REQUIP) 1 MG tablet Take 1 mg by mouth at bedtime.     tamsulosin  (FLOMAX ) 0.4 MG CAPS capsule Take 1 capsule (0.4 mg total) by mouth daily.     No current facility-administered medications on file prior to visit.    There are no Patient Instructions on file for this visit. No follow-ups on file.   Gwendlyn JONELLE Shank, NP

## 2023-12-01 ENCOUNTER — Encounter: Payer: Self-pay | Admitting: Vascular Surgery

## 2023-12-07 ENCOUNTER — Ambulatory Visit (INDEPENDENT_AMBULATORY_CARE_PROVIDER_SITE_OTHER): Admitting: Vascular Surgery

## 2023-12-28 ENCOUNTER — Ambulatory Visit (INDEPENDENT_AMBULATORY_CARE_PROVIDER_SITE_OTHER): Admitting: Vascular Surgery

## 2023-12-28 ENCOUNTER — Encounter (INDEPENDENT_AMBULATORY_CARE_PROVIDER_SITE_OTHER): Payer: Self-pay | Admitting: Vascular Surgery

## 2023-12-28 VITALS — BP 145/85 | HR 96

## 2023-12-28 DIAGNOSIS — Z89611 Acquired absence of right leg above knee: Secondary | ICD-10-CM

## 2023-12-28 DIAGNOSIS — E11610 Type 2 diabetes mellitus with diabetic neuropathic arthropathy: Secondary | ICD-10-CM

## 2023-12-28 DIAGNOSIS — I10 Essential (primary) hypertension: Secondary | ICD-10-CM

## 2023-12-28 DIAGNOSIS — Z794 Long term (current) use of insulin: Secondary | ICD-10-CM

## 2023-12-28 DIAGNOSIS — I872 Venous insufficiency (chronic) (peripheral): Secondary | ICD-10-CM

## 2023-12-28 MED ORDER — OXYCODONE-ACETAMINOPHEN 5-325 MG PO TABS
1.0000 | ORAL_TABLET | Freq: Four times a day (QID) | ORAL | 0 refills | Status: DC | PRN
Start: 2023-12-28 — End: 2024-01-05

## 2023-12-28 NOTE — Progress Notes (Addendum)
 Subjective:    Patient ID: Martin Riddle, male    DOB: 27-Aug-1970, 53 y.o.   MRN: [id number] Chief Complaint  Patient presents with   fu 1 weeks see BP    Martin Riddle returns today in follow up post right lower extremity AKA on 10/29/23 and left lower extremity Angiogram with stent placement on 11/05/23. Patient had difficulty healing Right AKA and was scheduled to return in 1 week but was hospitalized for 3-4 weeks for suicidal ideation. He presents today endorsing feeling well but concerned about his overall health. He feels the prior hospitalization has set him back due to nothing being done concerning his legs was being addressed. He endorses needing a new primary care physician as the prior was coming to his house but had to stop due to health reasons. He is unsure of the medications he is to be taking for all of his health conditions. He endorses wanting to get a prosthesis but has been ignored when he asks. I will attempt to address these issues with him today.     Review of Systems  Constitutional: Negative.   Cardiovascular:  Positive for leg swelling.  Skin:  Positive for color change and wound.       Patient Hx of lymphedema to left lower extremity. Color change with erythema and skin scaling.  New wound to planter aspect lateral left foot below 5th digit.   All other systems reviewed and are negative.      Objective:   Physical Exam Vitals reviewed.  Constitutional:      Appearance: Normal appearance. He is obese.  HENT:     Head: Normocephalic.  Eyes:     Pupils: Pupils are equal, round, and reactive to light.  Cardiovascular:     Rate and Rhythm: Normal rate and regular rhythm.     Pulses: Normal pulses.     Heart sounds: Normal heart sounds.  Pulmonary:     Effort: Pulmonary effort is normal.     Breath sounds: Normal breath sounds.  Abdominal:     General: Bowel sounds are normal.     Palpations: Abdomen is soft.  Musculoskeletal:        General:  Swelling present.     Right lower leg: Edema present.     Left lower leg: Edema present.     Comments: Patient with history of Right AKA with swelling and left lower extremity with +2 lymphedema  Skin:    General: Skin is warm and dry.     Capillary Refill: Capillary refill takes 2 to 3 seconds.     Comments: Scaly to left lower extremity  Neurological:     General: No focal deficit present.     Mental Status: He is alert and oriented to person, place, and time. Mental status is at baseline.  Psychiatric:        Mood and Affect: Mood normal.        Behavior: Behavior normal.        Thought Content: Thought content normal.        Judgment: Judgment normal.     BP (!) 145/85   Pulse 96   Past Medical History:  Diagnosis Date   Diabetes mellitus without complication (HCC)    Hypercholesteremia    Hypertension    Pancreatitis     Social History   Socioeconomic History   Marital status: Married    Spouse name: Not on file   Number of children: Not on  file   Years of education: Not on file   Highest education level: Not on file  Occupational History   Not on file  Tobacco Use   Smoking status: Former    Types: Cigarettes   Smokeless tobacco: Current    Types: Chew  Vaping Use   Vaping status: Never Used  Substance and Sexual Activity   Alcohol use: No    Alcohol/week: 0.0 standard drinks of alcohol   Drug use: No   Sexual activity: Yes    Partners: Male    Birth control/protection: None  Other Topics Concern   Not on file  Social History Narrative   Not on file   Social Drivers of Health   Financial Resource Strain: Not on file  Food Insecurity: Patient Declined (11/20/2023)   Hunger Vital Sign    Worried About Running Out of Food in the Last Year: Patient declined    Ran Out of Food in the Last Year: Patient declined  Transportation Needs: Patient Declined (11/20/2023)   PRAPARE - Administrator, Civil Service (Medical): Patient declined     Lack of Transportation (Non-Medical): Patient declined  Physical Activity: Not on file  Stress: Not on file  Social Connections: Patient Declined (10/27/2023)   Social Connection and Isolation Panel    Frequency of Communication with Friends and Family: Patient declined    Frequency of Social Gatherings with Friends and Family: Patient declined    Attends Religious Services: Patient declined    Active Member of Clubs or Organizations: Patient declined    Attends Banker Meetings: Patient declined    Marital Status: Patient declined  Intimate Partner Violence: Not At Risk (11/20/2023)   Humiliation, Afraid, Rape, and Kick questionnaire    Fear of Current or Ex-Partner: No    Emotionally Abused: No    Physically Abused: No    Sexually Abused: No    Past Surgical History:  Procedure Laterality Date   AMPUTATION Right 10/28/2023   Procedure: AMPUTATION, ABOVE KNEE;  Surgeon: Jama Cordella MATSU, MD;  Location: [facility] ORS;  Service: Vascular;  Laterality: Right;   ANKLE SURGERY Left 2001,2003   BACK SURGERY  2003,2012   LOWER EXTREMITY ANGIOGRAPHY Left 11/05/2023   Procedure: Lower Extremity Angiography;  Surgeon: Marea Selinda RAMAN, MD;  Location: [facility];  Service: Cardiovascular;  Laterality: Left;    Family History  Problem Relation Age of Onset   Heart disease Mother    Diabetes Mother    Diabetes Sister     Allergies  Allergen Reactions   Morphine Other (See Comments)    headache     A HEADACHE AND SWEATING AND LETHARGIC headache  headache, ,  A HEADACHE AND SWEATING AND LETHARGIC headache   Morphine And Codeine Other (See Comments)    headache   Other Other (See Comments)    A HEADACHE AND SWEATING AND LETHARGIC    headache  A HEADACHE AND SWEATING AND LETHARGIC, , headache       Latest Ref Rng & Units 11/23/2023    2:54 AM 11/22/2023    4:52 AM 11/21/2023    4:48 AM  CBC  WBC 4.0 - 10.5 K/uL 5.0  5.2  5.1   Hemoglobin 13.0 - 17.0 g/dL 8.7  8.8   9.0   Hematocrit 39.0 - 52.0 % 28.5  28.6  29.5   Platelets 150 - 400 K/uL 138  132  152       CMP  Component Value Date/Time   NA 137 11/23/2023 0254   NA 135 (L) 03/15/2014 0339   K 4.0 11/23/2023 0254   K 3.1 (L) 03/15/2014 0339   CL 98 11/23/2023 0254   CL 104 03/15/2014 0339   CO2 29 11/23/2023 0254   CO2 24 03/15/2014 0339   GLUCOSE 177 (H) 11/23/2023 0254   GLUCOSE 127 (H) 03/15/2014 0339   BUN 29 (H) 11/23/2023 0254   BUN 9 03/15/2014 0339   CREATININE 0.68 11/23/2023 0254   CREATININE 0.64 03/15/2014 0339   CALCIUM  9.7 11/23/2023 0254   CALCIUM  8.4 (L) 03/15/2014 0339   PROT 6.9 11/20/2023 0453   PROT 8.8 (H) 03/11/2014 0620   ALBUMIN  3.0 (L) 11/20/2023 0453   ALBUMIN  3.2 (L) 03/11/2014 0620   AST 26 11/20/2023 0453   AST 42 (H) 03/11/2014 0620   ALT 16 11/20/2023 0453   ALT SEE COMMENT 03/11/2014 0620   ALKPHOS 105 11/20/2023 0453   ALKPHOS 90 03/11/2014 0620   BILITOT 1.3 (H) 11/20/2023 0453   BILITOT 0.8 03/11/2014 0620   GFRNONAA >60 11/23/2023 0254   GFRNONAA >60 03/15/2014 0339     No results found.     Assessment & Plan:   1. Hx of AKA (above knee amputation), right (HCC) (Primary) Right lower extremity AKA healed well. Patient desires to ambulate with a prosthetic. Prior to amputation he was ambulating daily. Since amputation he has been using a wheel chair unable to ambulate. He agrees to participate in the care provided by [facility]. He is ready for the prosthetic. He has diabetes and obesity as comorbidity's but should be able to use a prosthetic to help control his comorbidity's. Patient with a prosthetic will be able to accomplish prior ADL's such as ambulating, toileting and bathing. Patients K level is 3. Consult  to [facility] clinic made today for Shrinker and prosthetic fitting.   2. Venous insufficiency of left leg Patient's left lower extremity is much less swollen after wrapping but continues to be edematous with erythema.   Continue to take ASA 81 mg daily and Plavix  75 mg daily due to prior stenting. Will Unna Boot wrap today due to ulcer on outer edge of left 5th digit. Will consult Podiatry to follow. Will also return in one week for [employer] rewrap and wound check. Plan is to have home health do wraps or have the patient go to wound clinic.   3. Primary hypertension Continue antihypertensive medications as already ordered, these medications have been reviewed and there are no changes at this time.  4. Type 2 diabetes mellitus with diabetic neuropathic arthropathy, with long-term current use of insulin  (HCC) Continue hypoglycemic medications as already ordered, these medications have been reviewed and there are no changes at this time. I recommend discussing with your PCP tighter control of your blood sugars. Discuss glucose continuous monitoring or possible insulin  pump.   Hgb A1C to be monitored as already arranged by primary service   Current Outpatient Medications on File Prior to Visit  Medication Sig Dispense Refill   acetaminophen  (TYLENOL ) 500 MG tablet Take 1,000 mg by mouth every 6 (six) hours as needed for mild pain or moderate pain.     atorvastatin  (LIPITOR) 20 MG tablet Take 1 tablet (20 mg total) by mouth daily. 90 tablet 1   busPIRone  (BUSPAR ) 30 MG tablet Take 30 mg by mouth 3 (three) times daily.     cetirizine (ZYRTEC) 10 MG tablet Take 10 mg by mouth daily.  DULoxetine  (CYMBALTA ) 60 MG capsule Take 60 mg by mouth daily.     hydrochlorothiazide  (HYDRODIURIL ) 12.5 MG tablet Take 12.5 mg by mouth daily.     insulin  aspart (NOVOLOG ) 100 UNIT/ML injection Inject 30 Units into the skin 3 (three) times daily with meals.     insulin  aspart (NOVOLOG ) 100 UNIT/ML injection Inject 0-20 Units into the skin 4 (four) times daily -  before meals and at bedtime. CBG 70 - 120: 0 units CBG 121 - 150: 3 units CBG 151 - 200: 4 units CBG 201 - 250: 7 units CBG 251 - 300: 11 units CBG 301 - 350: 15  units CBG 351 - 400: 20 units     insulin  glargine-yfgn (SEMGLEE ) 100 UNIT/ML injection Inject 0.9 mLs (90 Units total) into the skin 2 (two) times daily.     iron  polysaccharides (NIFEREX) 150 MG capsule Take 1 capsule (150 mg total) by mouth daily. 30 capsule 0   Multiple Vitamin (MULTIVITAMIN WITH MINERALS) TABS tablet Take 1 tablet by mouth daily. 90 tablet 1   nitroGLYCERIN  (NITROSTAT ) 0.4 MG SL tablet Place under the tongue.     polyethylene glycol (MIRALAX  / GLYCOLAX ) packet Take 17 g by mouth daily. 30 each 0   pregabalin  (LYRICA ) 200 MG capsule Take 1 capsule (200 mg total) by mouth 2 (two) times daily. 60 capsule 0   rOPINIRole (REQUIP) 1 MG tablet Take 1 mg by mouth at bedtime.     tamsulosin  (FLOMAX ) 0.4 MG CAPS capsule Take 1 capsule (0.4 mg total) by mouth daily.     No current facility-administered medications on file prior to visit.    There are no Patient Instructions on file for this visit. No follow-ups on file.   Gwendlyn JONELLE Shank, NP

## 2024-01-05 ENCOUNTER — Ambulatory Visit (INDEPENDENT_AMBULATORY_CARE_PROVIDER_SITE_OTHER): Admitting: Vascular Surgery

## 2024-01-05 ENCOUNTER — Telehealth (INDEPENDENT_AMBULATORY_CARE_PROVIDER_SITE_OTHER): Payer: Self-pay

## 2024-01-05 ENCOUNTER — Encounter (INDEPENDENT_AMBULATORY_CARE_PROVIDER_SITE_OTHER): Payer: Self-pay

## 2024-01-05 ENCOUNTER — Encounter (INDEPENDENT_AMBULATORY_CARE_PROVIDER_SITE_OTHER): Payer: Self-pay | Admitting: Vascular Surgery

## 2024-01-05 VITALS — BP 147/87 | HR 92 | Resp 18

## 2024-01-05 DIAGNOSIS — I872 Venous insufficiency (chronic) (peripheral): Secondary | ICD-10-CM

## 2024-01-05 DIAGNOSIS — Z794 Long term (current) use of insulin: Secondary | ICD-10-CM

## 2024-01-05 DIAGNOSIS — Z89611 Acquired absence of right leg above knee: Secondary | ICD-10-CM

## 2024-01-05 DIAGNOSIS — E11610 Type 2 diabetes mellitus with diabetic neuropathic arthropathy: Secondary | ICD-10-CM

## 2024-01-05 DIAGNOSIS — I10 Essential (primary) hypertension: Secondary | ICD-10-CM

## 2024-01-05 MED ORDER — OXYCODONE-ACETAMINOPHEN 5-325 MG PO TABS: 1.0000 | ORAL_TABLET | Freq: Four times a day (QID) | ORAL | 0 refills | Status: DC | PRN

## 2024-01-05 MED ORDER — PREGABALIN 150 MG PO CAPS: 150.0000 mg | ORAL_CAPSULE | Freq: Two times a day (BID) | ORAL | 3 refills | Status: AC

## 2024-01-05 NOTE — Telephone Encounter (Signed)
 Patient referral was sent to [facility] for weekly left le unna wraps. Patient has be approved for 12 visits and the office will contact the patient to scheduled.

## 2024-01-14 NOTE — Progress Notes (Signed)
 Subjective:    Patient ID: Martin Riddle, male    DOB: Aug 19, 1970, 53 y.o.   MRN: [id number] No chief complaint on file.   Martin Riddle returns today in follow up post right lower extremity AKA on 10/29/23 and left lower extremity Angiogram with stent placement on 11/05/23. Patient had difficulty healing Right AKA and was scheduled to return in 1 week but was hospitalized for 3-4 weeks for suicidal ideation. He presents today endorsing feeling well but concerned about his overall health. He feels the prior hospitalization has set him back due to nothing being done concerning his legs was being addressed. He was seen last week and placed in an [employer] to his left lower extremity. He returns today for recheck of his lower extremity. Home Health nursing was set up to start next week.     Review of Systems  Constitutional: Negative.   Cardiovascular:  Positive for leg swelling.  Musculoskeletal:        Hx of right AKA   Skin:  Positive for color change and wound.  All other systems reviewed and are negative.      Objective:   Physical Exam Vitals reviewed.  Constitutional:      Appearance: Normal appearance. He is obese.  HENT:     Head: Normocephalic.  Eyes:     Pupils: Pupils are equal, round, and reactive to light.  Cardiovascular:     Rate and Rhythm: Normal rate and regular rhythm.     Pulses: Normal pulses.     Heart sounds: Normal heart sounds.  Pulmonary:     Effort: Pulmonary effort is normal.     Breath sounds: Normal breath sounds.  Abdominal:     General: Bowel sounds are normal.     Palpations: Abdomen is soft.  Musculoskeletal:        General: Tenderness present.     Left lower leg: Edema present.  Skin:    General: Skin is warm and dry.     Capillary Refill: Capillary refill takes 2 to 3 seconds.  Neurological:     General: No focal deficit present.     Mental Status: He is alert and oriented to person, place, and time. Mental status is at baseline.   Psychiatric:        Mood and Affect: Mood normal.        Behavior: Behavior normal.        Thought Content: Thought content normal.        Judgment: Judgment normal.     BP (!) 147/87 (BP Location: Right Arm, Patient Position: Sitting, Cuff Size: Large)   Pulse 92   Resp 18   Past Medical History:  Diagnosis Date   Diabetes mellitus without complication (HCC)    Hypercholesteremia    Hypertension    Pancreatitis     Social History   Socioeconomic History   Marital status: Married    Spouse name: Not on file   Number of children: Not on file   Years of education: Not on file   Highest education level: Not on file  Occupational History   Not on file  Tobacco Use   Smoking status: Former    Types: Cigarettes   Smokeless tobacco: Current    Types: Chew  Vaping Use   Vaping status: Never Used  Substance and Sexual Activity   Alcohol use: No    Alcohol/week: 0.0 standard drinks of alcohol   Drug use: No   Sexual activity:  Yes    Partners: Male    Birth control/protection: None  Other Topics Concern   Not on file  Social History Narrative   Not on file   Social Drivers of Health   Financial Resource Strain: Not on file  Food Insecurity: Patient Declined (11/20/2023)   Hunger Vital Sign    Worried About Running Out of Food in the Last Year: Patient declined    Ran Out of Food in the Last Year: Patient declined  Transportation Needs: Patient Declined (11/20/2023)   PRAPARE - Administrator, Civil Service (Medical): Patient declined    Lack of Transportation (Non-Medical): Patient declined  Physical Activity: Not on file  Stress: Not on file  Social Connections: Patient Declined (10/27/2023)   Social Connection and Isolation Panel    Frequency of Communication with Friends and Family: Patient declined    Frequency of Social Gatherings with Friends and Family: Patient declined    Attends Religious Services: Patient declined    Active Member of Clubs  or Organizations: Patient declined    Attends Banker Meetings: Patient declined    Marital Status: Patient declined  Intimate Partner Violence: Not At Risk (11/20/2023)   Humiliation, Afraid, Rape, and Kick questionnaire    Fear of Current or Ex-Partner: No    Emotionally Abused: No    Physically Abused: No    Sexually Abused: No    Past Surgical History:  Procedure Laterality Date   AMPUTATION Right 10/28/2023   Procedure: AMPUTATION, ABOVE KNEE;  Surgeon: Jama Cordella MATSU, MD;  Location: [facility] ORS;  Service: Vascular;  Laterality: Right;   ANKLE SURGERY Left 2001,2003   BACK SURGERY  2003,2012   LOWER EXTREMITY ANGIOGRAPHY Left 11/05/2023   Procedure: Lower Extremity Angiography;  Surgeon: Marea Selinda RAMAN, MD;  Location: [facility];  Service: Cardiovascular;  Laterality: Left;    Family History  Problem Relation Age of Onset   Heart disease Mother    Diabetes Mother    Diabetes Sister     Allergies  Allergen Reactions   Morphine Other (See Comments)    headache     A HEADACHE AND SWEATING AND LETHARGIC headache  headache, ,  A HEADACHE AND SWEATING AND LETHARGIC headache   Morphine And Codeine Other (See Comments)    headache   Other Other (See Comments)    A HEADACHE AND SWEATING AND LETHARGIC    headache  A HEADACHE AND SWEATING AND LETHARGIC, , headache       Latest Ref Rng & Units 11/23/2023    2:54 AM 11/22/2023    4:52 AM 11/21/2023    4:48 AM  CBC  WBC 4.0 - 10.5 K/uL 5.0  5.2  5.1   Hemoglobin 13.0 - 17.0 g/dL 8.7  8.8  9.0   Hematocrit 39.0 - 52.0 % 28.5  28.6  29.5   Platelets 150 - 400 K/uL 138  132  152       CMP     Component Value Date/Time   NA 137 11/23/2023 0254   NA 135 (L) 03/15/2014 0339   K 4.0 11/23/2023 0254   K 3.1 (L) 03/15/2014 0339   CL 98 11/23/2023 0254   CL 104 03/15/2014 0339   CO2 29 11/23/2023 0254   CO2 24 03/15/2014 0339   GLUCOSE 177 (H) 11/23/2023 0254   GLUCOSE 127 (H) 03/15/2014 0339    BUN 29 (H) 11/23/2023 0254   BUN 9 03/15/2014 0339   CREATININE  0.68 11/23/2023 0254   CREATININE 0.64 03/15/2014 0339   CALCIUM  9.7 11/23/2023 0254   CALCIUM  8.4 (L) 03/15/2014 0339   PROT 6.9 11/20/2023 0453   PROT 8.8 (H) 03/11/2014 0620   ALBUMIN  3.0 (L) 11/20/2023 0453   ALBUMIN  3.2 (L) 03/11/2014 0620   AST 26 11/20/2023 0453   AST 42 (H) 03/11/2014 0620   ALT 16 11/20/2023 0453   ALT SEE COMMENT 03/11/2014 0620   ALKPHOS 105 11/20/2023 0453   ALKPHOS 90 03/11/2014 0620   BILITOT 1.3 (H) 11/20/2023 0453   BILITOT 0.8 03/11/2014 0620   GFRNONAA >60 11/23/2023 0254   GFRNONAA >60 03/15/2014 0339     No results found.     Assessment & Plan:   1. Hx of AKA (above knee amputation), right (HCC) (Primary) Right lower extremity AKA healed well. Consult to [facility] made last visit for Shrinker and prosthetic fitting. Patient endorses he has a visit in two weeks.    2. Venous insufficiency of left leg Patient's left lower extremity is much less swollen after wrapping but continues to be edematous with erythema. Continue to take ASA 81 mg daily and Plavix  75 mg daily due to prior stenting. Will Unna Boot wrap again today due to ulcer on outer edge of left 5th digit. Will start [facility] with home health. Plan is to follow up in 5 weeks for recheck.  3. Primary hypertension Continue antihypertensive medications as already ordered, these medications have been reviewed and there are no changes at this time   4. Type 2 diabetes mellitus with diabetic neuropathic arthropathy, with long-term current use of insulin  (HCC) Continue hypoglycemic medications as already ordered, these medications have been reviewed and there are no changes at this time. I recommend discussing with your PCP tighter control of your blood sugars. Discuss glucose continuous monitoring or possible insulin  pump.    Hgb A1C to be monitored as already arranged by primary service   Current Outpatient  Medications on File Prior to Visit  Medication Sig Dispense Refill   acetaminophen  (TYLENOL ) 500 MG tablet Take 1,000 mg by mouth every 6 (six) hours as needed for mild pain or moderate pain.     atorvastatin  (LIPITOR) 20 MG tablet Take 1 tablet (20 mg total) by mouth daily. 90 tablet 1   busPIRone  (BUSPAR ) 30 MG tablet Take 30 mg by mouth 3 (three) times daily.     cetirizine (ZYRTEC) 10 MG tablet Take 10 mg by mouth daily.     DULoxetine  (CYMBALTA ) 60 MG capsule Take 60 mg by mouth daily.     hydrochlorothiazide  (HYDRODIURIL ) 12.5 MG tablet Take 12.5 mg by mouth daily.     insulin  aspart (NOVOLOG ) 100 UNIT/ML injection Inject 30 Units into the skin 3 (three) times daily with meals.     insulin  aspart (NOVOLOG ) 100 UNIT/ML injection Inject 0-20 Units into the skin 4 (four) times daily -  before meals and at bedtime. CBG 70 - 120: 0 units CBG 121 - 150: 3 units CBG 151 - 200: 4 units CBG 201 - 250: 7 units CBG 251 - 300: 11 units CBG 301 - 350: 15 units CBG 351 - 400: 20 units     insulin  glargine-yfgn (SEMGLEE ) 100 UNIT/ML injection Inject 0.9 mLs (90 Units total) into the skin 2 (two) times daily.     iron  polysaccharides (NIFEREX) 150 MG capsule Take 1 capsule (150 mg total) by mouth daily. 30 capsule 0   Multiple Vitamin (MULTIVITAMIN WITH MINERALS)  TABS tablet Take 1 tablet by mouth daily. 90 tablet 1   nitroGLYCERIN  (NITROSTAT ) 0.4 MG SL tablet Place under the tongue.     polyethylene glycol (MIRALAX  / GLYCOLAX ) packet Take 17 g by mouth daily. 30 each 0   pregabalin  (LYRICA ) 200 MG capsule Take 1 capsule (200 mg total) by mouth 2 (two) times daily. 60 capsule 0   rOPINIRole (REQUIP) 1 MG tablet Take 1 mg by mouth at bedtime.     tamsulosin  (FLOMAX ) 0.4 MG CAPS capsule Take 1 capsule (0.4 mg total) by mouth daily.     No current facility-administered medications on file prior to visit.    There are no Patient Instructions on file for this visit. No follow-ups on file.   Gwendlyn JONELLE Shank, NP

## 2024-01-21 ENCOUNTER — Ambulatory Visit: Admitting: Podiatry

## 2024-02-01 ENCOUNTER — Telehealth (INDEPENDENT_AMBULATORY_CARE_PROVIDER_SITE_OTHER): Payer: Self-pay | Admitting: Vascular Surgery

## 2024-02-01 NOTE — Telephone Encounter (Signed)
 Pt spouse dropped off FMLA papers for herself. Paperwork is in nurses box.

## 2024-02-09 ENCOUNTER — Ambulatory Visit (INDEPENDENT_AMBULATORY_CARE_PROVIDER_SITE_OTHER): Admitting: Vascular Surgery

## 2024-02-12 ENCOUNTER — Ambulatory Visit (INDEPENDENT_AMBULATORY_CARE_PROVIDER_SITE_OTHER): Admitting: Vascular Surgery

## 2024-02-16 ENCOUNTER — Telehealth (INDEPENDENT_AMBULATORY_CARE_PROVIDER_SITE_OTHER): Payer: Self-pay | Admitting: Vascular Surgery

## 2024-02-16 NOTE — Telephone Encounter (Signed)
 Martin Riddle with [facility] called back again. She is needing amended office notes from last office visit. Martin Riddle states that she faxed over request on 8.20.25, called and left VM on both 8.25.25 and 9.3.25. State that she needs the office note to state his ADL's and also K level (activity level). In order to get a prior auth, she is needing these updated with a K level of at least 3. Please call Martin Riddle back at [phone] and leave a detailed message as she is in charge of 5 districts.

## 2024-02-16 NOTE — Telephone Encounter (Signed)
 Fax request was received and place on provider desk. Once note has been addend the note will be faxed to [facility]

## 2024-02-17 NOTE — Telephone Encounter (Unsigned)
 Copied from CRM [id number]. Topic: Appointments - Scheduling Inquiry for Clinic >> Feb 17, 2024 10:28 AM Myrick T wrote: Reason for CRM: Tanya from [facility] called to see if patient had been called per referral. Glenys is requesting a call to patient for a new patient appt

## 2024-02-18 ENCOUNTER — Ambulatory Visit (INDEPENDENT_AMBULATORY_CARE_PROVIDER_SITE_OTHER): Admitting: Vascular Surgery

## 2024-02-18 NOTE — Telephone Encounter (Signed)
 Note has been faxed over to [facility]

## 2024-02-29 ENCOUNTER — Encounter (INDEPENDENT_AMBULATORY_CARE_PROVIDER_SITE_OTHER): Payer: Self-pay | Admitting: Vascular Surgery

## 2024-02-29 ENCOUNTER — Ambulatory Visit (INDEPENDENT_AMBULATORY_CARE_PROVIDER_SITE_OTHER): Admitting: Vascular Surgery

## 2024-02-29 VITALS — BP 133/82 | HR 82 | Ht 78.0 in

## 2024-02-29 DIAGNOSIS — Z89611 Acquired absence of right leg above knee: Secondary | ICD-10-CM

## 2024-02-29 DIAGNOSIS — E11610 Type 2 diabetes mellitus with diabetic neuropathic arthropathy: Secondary | ICD-10-CM | POA: Diagnosis not present

## 2024-02-29 DIAGNOSIS — I872 Venous insufficiency (chronic) (peripheral): Secondary | ICD-10-CM | POA: Diagnosis not present

## 2024-02-29 DIAGNOSIS — I10 Essential (primary) hypertension: Secondary | ICD-10-CM

## 2024-02-29 DIAGNOSIS — Z794 Long term (current) use of insulin: Secondary | ICD-10-CM

## 2024-02-29 MED ORDER — HYDROCODONE-ACETAMINOPHEN 7.5-325 MG/15ML PO SOLN: 15.0000 mL | Freq: Four times a day (QID) | ORAL | 0 refills | Status: DC | PRN

## 2024-02-29 NOTE — Progress Notes (Signed)
 Subjective:    Patient ID: Martin Riddle, male    DOB: [birth year], 53 y.o.   MRN: [id number] Chief Complaint  Patient presents with   Follow-up    Martin Riddle returns today in follow up post right lower extremity AKA on 10/29/23 and left lower extremity Angiogram with stent placement on 11/05/23. Patient's right AKA is well healed today. He presents today endorsing feeling well.  He is being seen by home health nursing and placed in an [facility] to his left lower extremity. He returns today for recheck of his lower extremity. He continues to have an ulceration and sore to his left lateral plantar aspect of foot. Patient endorses he has not seen wound care for this. Left lower extremity is less swollen today and discoloration is better. Patient continues to complain of low back pain and hip pain due to his inability to walk not having a prosthetic. A note was sent to [facility] clinic regarding the patients willingness and the need to use and have a prosthetic leg.       Review of Systems  Constitutional: Negative.   Cardiovascular:  Positive for leg swelling.  Musculoskeletal:  Positive for back pain.  Skin:  Positive for color change and wound.       Wound to left medial Plantar aspect of his left foot.   All other systems reviewed and are negative.      Objective:   Physical Exam Constitutional:      Appearance: Normal appearance. He is obese.  HENT:     Head: Normocephalic.  Eyes:     Pupils: Pupils are equal, round, and reactive to light.  Cardiovascular:     Rate and Rhythm: Normal rate and regular rhythm.     Pulses: Normal pulses.     Heart sounds: Normal heart sounds.  Pulmonary:     Effort: Pulmonary effort is normal.     Breath sounds: Normal breath sounds.  Abdominal:     General: Bowel sounds are normal.     Palpations: Abdomen is soft.  Musculoskeletal:        General: Swelling present.     Left lower leg: Edema present.  Skin:    General: Skin is warm and  dry.     Capillary Refill: Capillary refill takes 2 to 3 seconds.     Comments: Positive wound to left lower extremity medial plantar aspect of foot.   Neurological:     General: No focal deficit present.     Mental Status: He is alert and oriented to person, place, and time. Mental status is at baseline.  Psychiatric:        Mood and Affect: Mood normal.        Behavior: Behavior normal.        Thought Content: Thought content normal.        Judgment: Judgment normal.     BP 133/82   Pulse 82   Ht 6' 6 (1.981 m) Comment: prior height  BMI 40.84 kg/m   Past Medical History:  Diagnosis Date   Diabetes mellitus without complication (HCC)    Hypercholesteremia    Hypertension    Pancreatitis     Social History   Socioeconomic History   Marital status: Married    Spouse name: Not on file   Number of children: Not on file   Years of education: Not on file   Highest education level: Not on file  Occupational History   Not on  file  Tobacco Use   Smoking status: Former    Types: Cigarettes   Smokeless tobacco: Current    Types: Chew  Vaping Use   Vaping status: Never Used  Substance and Sexual Activity   Alcohol use: No    Alcohol/week: 0.0 standard drinks of alcohol   Drug use: No   Sexual activity: Yes    Partners: Male    Birth control/protection: None  Other Topics Concern   Not on file  Social History Narrative   Not on file   Social Drivers of Health   Financial Resource Strain: Not on file  Food Insecurity: Patient Declined (11/20/2023)   Hunger Vital Sign    Worried About Running Out of Food in the Last Year: Patient declined    Ran Out of Food in the Last Year: Patient declined  Transportation Needs: Patient Declined (11/20/2023)   PRAPARE - Administrator, Civil Service (Medical): Patient declined    Lack of Transportation (Non-Medical): Patient declined  Physical Activity: Not on file  Stress: Not on file  Social Connections: Patient  Declined (10/27/2023)   Social Connection and Isolation Panel    Frequency of Communication with Friends and Family: Patient declined    Frequency of Social Gatherings with Friends and Family: Patient declined    Attends Religious Services: Patient declined    Active Member of Clubs or Organizations: Patient declined    Attends Banker Meetings: Patient declined    Marital Status: Patient declined  Intimate Partner Violence: Not At Risk (11/20/2023)   Humiliation, Afraid, Rape, and Kick questionnaire    Fear of Current or Ex-Partner: No    Emotionally Abused: No    Physically Abused: No    Sexually Abused: No    Past Surgical History:  Procedure Laterality Date   AMPUTATION Right 10/28/2023   Procedure: AMPUTATION, ABOVE KNEE;  Surgeon: Jama Cordella MATSU, MD;  Location: [facility] ORS;  Service: Vascular;  Laterality: Right;   ANKLE SURGERY Left 2001,2003   BACK SURGERY  2003,2012   LOWER EXTREMITY ANGIOGRAPHY Left 11/05/2023   Procedure: Lower Extremity Angiography;  Surgeon: Marea Selinda RAMAN, MD;  Location: [facility] INVASIVE CV LAB;  Service: Cardiovascular;  Laterality: Left;    Family History  Problem Relation Age of Onset   Heart disease Mother    Diabetes Mother    Diabetes Sister     Allergies  Allergen Reactions   Morphine Other (See Comments)    headache     A HEADACHE AND SWEATING AND LETHARGIC headache  headache, ,  A HEADACHE AND SWEATING AND LETHARGIC headache   Morphine And Codeine Other (See Comments)    headache   Other Other (See Comments)    A HEADACHE AND SWEATING AND LETHARGIC    headache  A HEADACHE AND SWEATING AND LETHARGIC, , headache       Latest Ref Rng & Units 11/23/2023    2:54 AM 11/22/2023    4:52 AM 11/21/2023    4:48 AM  CBC  WBC 4.0 - 10.5 K/uL 5.0  5.2  5.1   Hemoglobin 13.0 - 17.0 g/dL 8.7  8.8  9.0   Hematocrit 39.0 - 52.0 % 28.5  28.6  29.5   Platelets 150 - 400 K/uL 138  132  152       CMP     Component Value Date/Time    NA 137 11/23/2023 0254   NA 135 (L) 03/15/2014 0339   K 4.0 11/23/2023 0254  K 3.1 (L) 03/15/2014 0339   CL 98 11/23/2023 0254   CL 104 03/15/2014 0339   CO2 29 11/23/2023 0254   CO2 24 03/15/2014 0339   GLUCOSE 177 (H) 11/23/2023 0254   GLUCOSE 127 (H) 03/15/2014 0339   BUN 29 (H) 11/23/2023 0254   BUN 9 03/15/2014 0339   CREATININE 0.68 11/23/2023 0254   CREATININE 0.64 03/15/2014 0339   CALCIUM  9.7 11/23/2023 0254   CALCIUM  8.4 (L) 03/15/2014 0339   PROT 6.9 11/20/2023 0453   PROT 8.8 (H) 03/11/2014 0620   ALBUMIN  3.0 (L) 11/20/2023 0453   ALBUMIN  3.2 (L) 03/11/2014 0620   AST 26 11/20/2023 0453   AST 42 (H) 03/11/2014 0620   ALT 16 11/20/2023 0453   ALT SEE COMMENT 03/11/2014 0620   ALKPHOS 105 11/20/2023 0453   ALKPHOS 90 03/11/2014 0620   BILITOT 1.3 (H) 11/20/2023 0453   BILITOT 0.8 03/11/2014 0620   GFRNONAA >60 11/23/2023 0254   GFRNONAA >60 03/15/2014 0339     No results found.     Assessment & Plan:   1. Hx of AKA (above knee amputation), right (HCC) (Primary) Hx of Right AKA amputation which is now well healed. Patient is cleared to work with [facility] for lower extremity prosthetic.   2. Venous insufficiency of left leg Left lower extremity remains with +1 edema and in unna boot wraps. Patient noted to have a left foot plantar wound. Patient will be referred to the wound clinic for treatment. This will not interfere with his ability to get a prosthetic as he is currently ambulating with a walker using his left leg.   Plan is to re-wrap his left lower extremity to keep edema to a minimum as he is restricted to the amount of activity without having a prosthetic. The wrap does not interfere with wound care to his left foot. Left foot ulceration today was treated with aquacell to the wound bed and covered with guaze and mepilex. Patient to follow up with wound care for continued treatment.   Patient to follow up with vein and vascular surgery in 3 months  after treatment with wound clinic and [facility] for prosthetic.  Patient in previous visit was referred to new PCP. Patient instructed to follow up for all other medical concerns. Patient will NO LONGER RECEIVE PAIN MEDICATION FROM VASCULAR SURGERY FOR HI AND BACK PAIN.   3. Primary hypertension Continue antihypertensive medications as already ordered, these medications have been reviewed and there are no changes at this time.  4. Type 2 diabetes mellitus with diabetic neuropathic arthropathy, with long-term current use of insulin  (HCC) Continue hypoglycemic medications as already ordered, these medications have been reviewed and there are no changes at this time.  Hgb A1C to be monitored as already arranged by primary service   Current Outpatient Medications on File Prior to Visit  Medication Sig Dispense Refill   acetaminophen  (TYLENOL ) 500 MG tablet Take 1,000 mg by mouth every 6 (six) hours as needed for mild pain or moderate pain.     atorvastatin  (LIPITOR) 20 MG tablet Take 1 tablet (20 mg total) by mouth daily. 90 tablet 1   busPIRone  (BUSPAR ) 30 MG tablet Take 30 mg by mouth 3 (three) times daily.     cetirizine (ZYRTEC) 10 MG tablet Take 10 mg by mouth daily.     DULoxetine  (CYMBALTA ) 60 MG capsule Take 60 mg by mouth daily.     hydrochlorothiazide  (HYDRODIURIL ) 12.5 MG tablet Take 12.5 mg  by mouth daily.     insulin  aspart (NOVOLOG ) 100 UNIT/ML injection Inject 30 Units into the skin 3 (three) times daily with meals.     insulin  aspart (NOVOLOG ) 100 UNIT/ML injection Inject 0-20 Units into the skin 4 (four) times daily -  before meals and at bedtime. CBG 70 - 120: 0 units CBG 121 - 150: 3 units CBG 151 - 200: 4 units CBG 201 - 250: 7 units CBG 251 - 300: 11 units CBG 301 - 350: 15 units CBG 351 - 400: 20 units     insulin  glargine-yfgn (SEMGLEE ) 100 UNIT/ML injection Inject 0.9 mLs (90 Units total) into the skin 2 (two) times daily.     Multiple Vitamin (MULTIVITAMIN WITH  MINERALS) TABS tablet Take 1 tablet by mouth daily. 90 tablet 1   nitroGLYCERIN  (NITROSTAT ) 0.4 MG SL tablet Place under the tongue.     polyethylene glycol (MIRALAX  / GLYCOLAX ) packet Take 17 g by mouth daily. 30 each 0   pregabalin  (LYRICA ) 150 MG capsule Take 1 capsule (150 mg total) by mouth 2 (two) times daily. 60 capsule 3   pregabalin  (LYRICA ) 200 MG capsule Take 1 capsule (200 mg total) by mouth 2 (two) times daily. 60 capsule 0   rOPINIRole (REQUIP) 1 MG tablet Take 1 mg by mouth at bedtime.     cloNIDine  (CATAPRES ) 0.2 MG tablet Take 0.2 mg by mouth 3 (three) times daily.     iron  polysaccharides (NIFEREX) 150 MG capsule Take 1 capsule (150 mg total) by mouth daily. (Patient not taking: Reported on 02/29/2024) 30 capsule 0   oxyCODONE -acetaminophen  (PERCOCET/ROXICET) 5-325 MG tablet Take 1 tablet by mouth every 6 (six) hours as needed for severe pain (pain score 7-10). (Patient not taking: Reported on 02/29/2024) 24 tablet 0   tamsulosin  (FLOMAX ) 0.4 MG CAPS capsule Take 1 capsule (0.4 mg total) by mouth daily. (Patient not taking: Reported on 02/29/2024)     No current facility-administered medications on file prior to visit.    There are no Patient Instructions on file for this visit. No follow-ups on file.   Gwendlyn JONELLE Shank, NP

## 2024-03-02 ENCOUNTER — Telehealth (INDEPENDENT_AMBULATORY_CARE_PROVIDER_SITE_OTHER): Payer: Self-pay

## 2024-03-02 NOTE — Telephone Encounter (Signed)
 Patient has been notified that appts has been made and upcoming appt with Dr Tobie (podiatry) for 03/08/24 @ 2pm and Mliss Spray NP 04/13/24 @1 .20 pm but has been placed on waiting list. Patient verbalized understanding

## 2024-03-08 ENCOUNTER — Ambulatory Visit: Admitting: Podiatry

## 2024-03-08 DIAGNOSIS — L97522 Non-pressure chronic ulcer of other part of left foot with fat layer exposed: Secondary | ICD-10-CM

## 2024-03-08 DIAGNOSIS — E11621 Type 2 diabetes mellitus with foot ulcer: Secondary | ICD-10-CM

## 2024-03-08 NOTE — Progress Notes (Unsigned)
 Subjective:  Patient ID: Martin Riddle, male    DOB: 1970/07/17,  MRN: [id number]  Chief Complaint  Patient presents with   Foot Pain    NP - wound on bottom of left foot. Ulcer on the  left foot. Vascular wrap on the left leg. Amputee on the righ    53 y.o. male presents for wound care.  Patient presents with left submetatarsal 5 ulceration that has been present for quite some time.  Patient states that they have been trying to take care of it on their own but has gotten worse.  He is a diabetic.  He has been to vascular doctors.  He is known to Dr. Alray he has stents placed in the left leg.  He did not have ulceration at that time.  Pain scale is 5 out of 10 dull aching nature.  He is has an AKA on the right side   Review of Systems: Negative except as noted in the HPI. Denies N/V/F/Ch.  Past Medical History:  Diagnosis Date   Diabetes mellitus without complication (HCC)    Hypercholesteremia    Hypertension    Pancreatitis     Current Outpatient Medications:    acetaminophen  (TYLENOL ) 500 MG tablet, Take 1,000 mg by mouth every 6 (six) hours as needed for mild pain or moderate pain., Disp: , Rfl:    atorvastatin  (LIPITOR) 20 MG tablet, Take 1 tablet (20 mg total) by mouth daily., Disp: 90 tablet, Rfl: 1   busPIRone  (BUSPAR ) 30 MG tablet, Take 30 mg by mouth 3 (three) times daily., Disp: , Rfl:    cetirizine (ZYRTEC) 10 MG tablet, Take 10 mg by mouth daily., Disp: , Rfl:    cloNIDine  (CATAPRES ) 0.2 MG tablet, Take 0.2 mg by mouth 3 (three) times daily., Disp: , Rfl:    DULoxetine  (CYMBALTA ) 60 MG capsule, Take 60 mg by mouth daily., Disp: , Rfl:    hydrochlorothiazide  (HYDRODIURIL ) 12.5 MG tablet, Take 12.5 mg by mouth daily., Disp: , Rfl:    HYDROcodone -acetaminophen  (HYCET) 7.5-325 mg/15 ml solution, Take 15 mLs by mouth 4 (four) times daily as needed for moderate pain (pain score 4-6)., Disp: 120 mL, Rfl: 0   insulin  aspart (NOVOLOG ) 100 UNIT/ML injection, Inject 30 Units  into the skin 3 (three) times daily with meals., Disp: , Rfl:    insulin  aspart (NOVOLOG ) 100 UNIT/ML injection, Inject 0-20 Units into the skin 4 (four) times daily -  before meals and at bedtime. CBG 70 - 120: 0 units CBG 121 - 150: 3 units CBG 151 - 200: 4 units CBG 201 - 250: 7 units CBG 251 - 300: 11 units CBG 301 - 350: 15 units CBG 351 - 400: 20 units, Disp: , Rfl:    insulin  glargine-yfgn (SEMGLEE ) 100 UNIT/ML injection, Inject 0.9 mLs (90 Units total) into the skin 2 (two) times daily., Disp: , Rfl:    iron  polysaccharides (NIFEREX) 150 MG capsule, Take 1 capsule (150 mg total) by mouth daily. (Patient not taking: Reported on 02/29/2024), Disp: 30 capsule, Rfl: 0   Multiple Vitamin (MULTIVITAMIN WITH MINERALS) TABS tablet, Take 1 tablet by mouth daily., Disp: 90 tablet, Rfl: 1   nitroGLYCERIN  (NITROSTAT ) 0.4 MG SL tablet, Place under the tongue., Disp: , Rfl:    oxyCODONE -acetaminophen  (PERCOCET/ROXICET) 5-325 MG tablet, Take 1 tablet by mouth every 6 (six) hours as needed for severe pain (pain score 7-10). (Patient not taking: Reported on 02/29/2024), Disp: 24 tablet, Rfl: 0   polyethylene glycol (MIRALAX  /  GLYCOLAX ) packet, Take 17 g by mouth daily., Disp: 30 each, Rfl: 0   pregabalin  (LYRICA ) 150 MG capsule, Take 1 capsule (150 mg total) by mouth 2 (two) times daily., Disp: 60 capsule, Rfl: 3   pregabalin  (LYRICA ) 200 MG capsule, Take 1 capsule (200 mg total) by mouth 2 (two) times daily., Disp: 60 capsule, Rfl: 0   rOPINIRole (REQUIP) 1 MG tablet, Take 1 mg by mouth at bedtime., Disp: , Rfl:    tamsulosin  (FLOMAX ) 0.4 MG CAPS capsule, Take 1 capsule (0.4 mg total) by mouth daily. (Patient not taking: Reported on 02/29/2024), Disp: , Rfl:   Social History   Tobacco Use  Smoking Status Former   Types: Cigarettes  Smokeless Tobacco Current   Types: Chew    Allergies  Allergen Reactions   Morphine Other (See Comments)    headache     A HEADACHE AND SWEATING AND LETHARGIC  headache  headache, ,  A HEADACHE AND SWEATING AND LETHARGIC headache   Morphine And Codeine Other (See Comments)    headache   Other Other (See Comments)    A HEADACHE AND SWEATING AND LETHARGIC    headache  A HEADACHE AND SWEATING AND LETHARGIC, , headache   Objective:  There were no vitals filed for this visit. There is no height or weight on file to calculate BMI. Constitutional Well developed. Well nourished.  Vascular Dorsalis pedis pulses palpable left side.  Right AKA Posterior tibial pulses palpable left side Capillary refill normal to all digits.  No cyanosis or clubbing noted. Pedal hair growth normal.  Neurologic Normal speech. Oriented to person, place, and time. Protective sensation absent  Dermatologic Wound Location: Left submetatarsal 5 with fat layer does not probe down to bone Wound Base: Mixed Granular/Fibrotic Peri-wound: Calloused Exudate: Scant/small amount Serosanguinous exudate Wound Measurements: - See below  Orthopedic: No pain to palpation either foot.   Radiographs: None Assessment:   1. 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  Ulcer left submetatarsal 5 ulcer fat layer exposed -Debridement as below. -Dressed with Betadine wet-to-dry dressing, DSD. -Continue off-loading with surgical shoe. - Surgical shoe was dispensed - Patient has extensive history of vein and vascular with Dr. Marea.  He will take the patient for an angiogram to doing any type of floating osteotomy.  Patient will agree with the plan.  Procedure: Excisional Debridement of Wound Tool: Sharp #312 chisel blade/tissue nipper Type of Debridement: Sharp Excisional Frequency: @Every  3 weeks until appropriately healed.  Dressing is to be changed daily/keeping the wound clean and dry Rationale: Removal of non-viable soft tissue from the wound to promote healing.  Anesthesia:  none Pre-Debridement Wound Measurements: 1 cm x 1 cm x 0.3 cm  Post-Debridement Wound Measurements: 1.1 cm x 1.1 cm x 0.4 cm  Area devitalized tissue removed(nonviable tissue only): 0.3 cm x 0.5 cm.  Blood loss: Minimal (<50cc) Depth of Debridement: with fat layer exposed Description of tissue removed: Devitalized Tissue Technique: The wound and the surrounding skin were prepped and draped in usual aseptic fashion.  Aseptic technique was maintained throughout the procedure.  Using #312 blade/tissue nipper sharp debridement of necrotic/nonviable tissue was performed until healthy bleeding wound bed was achieved.  No underlying bone or tendon was exposed during debridement.  The wound was thoroughly irrigated with normal saline solution Wound Progress:  Current Wound Volume: Debridement was performed of the chronic nonhealing diabetic foot wound  on left foot submetatarsal 5.  Debridement removed 0.3 cm x 0.3 cm of the necrotic tissue and subcutaneous tissue and moderate amount of purulent drainage was not present. Presence/absence of tissue: Necrotic tissue/nonviable tissue present at the base of the wound.  Sharp debridement was performed to remove the necrotic tissue/nonviable tissue back to viable tissue.  No devitalized/nonviable tissue present postdebridement.  Wound appeared clean and clear of infection No material in the wound was present that was identified to be inhibiting healing. Dressing: Dry, sterile, compression dressing. Disposition: Patient tolerated procedure well. Patient to return in 1 week for follow-up or as listed above.  No follow-ups on file.

## 2024-03-09 ENCOUNTER — Telehealth (INDEPENDENT_AMBULATORY_CARE_PROVIDER_SITE_OTHER): Payer: Self-pay

## 2024-03-09 NOTE — Telephone Encounter (Signed)
 I attempted to contact the patient to schedule him for a left leg angio with Dr. Wyn Quaker. A message was left for a return call.

## 2024-03-10 ENCOUNTER — Telehealth (INDEPENDENT_AMBULATORY_CARE_PROVIDER_SITE_OTHER): Payer: Self-pay

## 2024-03-10 NOTE — Telephone Encounter (Signed)
 I attempted to contact the patient's spouse  to schedule him for a left leg angio with Dr. Marea. A message was left for a return call.

## 2024-03-15 ENCOUNTER — Telehealth (INDEPENDENT_AMBULATORY_CARE_PROVIDER_SITE_OTHER): Payer: Self-pay

## 2024-03-15 NOTE — Telephone Encounter (Signed)
 I attempted to contact the patient and spouse to schedule his left leg angio with Dr. Marea. A message was left for a return call.

## 2024-03-29 ENCOUNTER — Ambulatory Visit: Admitting: Podiatry

## 2024-04-13 ENCOUNTER — Ambulatory Visit: Admitting: Nurse Practitioner

## 2024-05-18 ENCOUNTER — Telehealth (INDEPENDENT_AMBULATORY_CARE_PROVIDER_SITE_OTHER): Payer: Self-pay | Admitting: Vascular Surgery

## 2024-05-18 NOTE — Telephone Encounter (Signed)
 Patient left voicemail on [facility] line stating that his toes are looking dark and the sore on the bottom of his foot has not healed up. Please advise what should be scheduled for pt

## 2024-05-18 NOTE — Telephone Encounter (Signed)
 Well it looks like we repeatedly try to schedule him for a LLE angiogram in october but he never answered.  I think we should see if he is open to getting scheduled for the angiogram but if not we can try to move up his visit with BP and add ABIs

## 2024-05-19 ENCOUNTER — Encounter (INDEPENDENT_AMBULATORY_CARE_PROVIDER_SITE_OTHER): Payer: Self-pay

## 2024-05-19 NOTE — Telephone Encounter (Signed)
 Spoke with the patient's spouse and he has been scheduled for a LLE angio with Dr. Marea on 05/23/24 with a 11:00 am arrival time to the [facility]. Pre-procedure instructions were discussed and will be mailed. Patient's spouse was advised to write the information down as she advised that he does not use Mychart.

## 2024-05-23 ENCOUNTER — Ambulatory Visit
Admission: RE | Admit: 2024-05-23 | Discharge: 2024-05-23 | Disposition: A | Source: Home / Self Care | Attending: Vascular Surgery | Admitting: Vascular Surgery

## 2024-05-23 ENCOUNTER — Other Ambulatory Visit: Payer: Self-pay

## 2024-05-23 ENCOUNTER — Encounter: Admission: RE | Disposition: A | Payer: Self-pay | Source: Home / Self Care | Attending: Vascular Surgery

## 2024-05-23 ENCOUNTER — Encounter: Payer: Self-pay | Admitting: Vascular Surgery

## 2024-05-23 DIAGNOSIS — I70299 Other atherosclerosis of native arteries of extremities, unspecified extremity: Secondary | ICD-10-CM

## 2024-05-23 DIAGNOSIS — Z89611 Acquired absence of right leg above knee: Secondary | ICD-10-CM | POA: Insufficient documentation

## 2024-05-23 DIAGNOSIS — Z9889 Other specified postprocedural states: Secondary | ICD-10-CM | POA: Diagnosis not present

## 2024-05-23 DIAGNOSIS — I70245 Atherosclerosis of native arteries of left leg with ulceration of other part of foot: Secondary | ICD-10-CM

## 2024-05-23 DIAGNOSIS — Z981 Arthrodesis status: Secondary | ICD-10-CM | POA: Diagnosis not present

## 2024-05-23 DIAGNOSIS — L97529 Non-pressure chronic ulcer of other part of left foot with unspecified severity: Secondary | ICD-10-CM | POA: Diagnosis not present

## 2024-05-23 DIAGNOSIS — T82856A Stenosis of peripheral vascular stent, initial encounter: Secondary | ICD-10-CM | POA: Diagnosis not present

## 2024-05-23 DIAGNOSIS — E1151 Type 2 diabetes mellitus with diabetic peripheral angiopathy without gangrene: Secondary | ICD-10-CM | POA: Insufficient documentation

## 2024-05-23 DIAGNOSIS — E11621 Type 2 diabetes mellitus with foot ulcer: Secondary | ICD-10-CM | POA: Insufficient documentation

## 2024-05-23 DIAGNOSIS — I10 Essential (primary) hypertension: Secondary | ICD-10-CM | POA: Insufficient documentation

## 2024-05-23 HISTORY — PX: LOWER EXTREMITY ANGIOGRAPHY: CATH118251

## 2024-05-23 LAB — CREATININE, SERUM
Creatinine, Ser: 0.72 mg/dL (ref 0.61–1.24)
GFR, Estimated: >60 mL/min (ref >60)

## 2024-05-23 LAB — BUN: BUN: 19 mg/dL (ref 6–20)

## 2024-05-23 LAB — GLUCOSE, CAPILLARY
Glucose-Capillary: 214 mg/dL — ABNORMAL HIGH (ref 70–99)
Glucose-Capillary: 285 mg/dL — ABNORMAL HIGH (ref 70–99)

## 2024-05-23 SURGERY — LOWER EXTREMITY ANGIOGRAPHY
Anesthesia: Moderate Sedation | Site: Leg Lower | Laterality: Left

## 2024-05-23 MED ORDER — LABETALOL HCL 5 MG/ML IV SOLN: 10.0000 mg | INTRAVENOUS | Status: DC | PRN

## 2024-05-23 MED ORDER — SODIUM CHLORIDE 0.9 % IV SOLN: INTRAVENOUS | Status: DC

## 2024-05-23 MED ORDER — MIDAZOLAM HCL 2 MG/2ML IJ SOLN
INTRAMUSCULAR | Status: AC
  Filled 2024-05-23: qty 4

## 2024-05-23 MED ORDER — MIDAZOLAM HCL (PF) 2 MG/2ML IJ SOLN
INTRAMUSCULAR | Status: DC | PRN
  Administered 2024-05-23 (×2): .5 mg via INTRAVENOUS
  Administered 2024-05-23: 13:00:00 2 mg via INTRAVENOUS
  Administered 2024-05-23: 13:00:00 .5 mg via INTRAVENOUS

## 2024-05-23 MED ORDER — DIPHENHYDRAMINE HCL 50 MG/ML IJ SOLN: 50.0000 mg | Freq: Once | INTRAMUSCULAR | Status: DC | PRN

## 2024-05-23 MED ORDER — MIDAZOLAM HCL 2 MG/ML PO SYRP
ORAL_SOLUTION | ORAL | Status: AC
  Filled 2024-05-23: qty 5

## 2024-05-23 MED ORDER — SODIUM CHLORIDE 0.9 % IV SOLN: 250.0000 mL | INTRAVENOUS | Status: DC | PRN

## 2024-05-23 MED ORDER — ACETAMINOPHEN 325 MG PO TABS: 650.0000 mg | ORAL_TABLET | ORAL | Status: DC | PRN

## 2024-05-23 MED ORDER — CLOPIDOGREL BISULFATE 75 MG PO TABS: 75.0000 mg | ORAL_TABLET | Freq: Every day | ORAL | 11 refills | Status: AC

## 2024-05-23 MED ORDER — HEPARIN (PORCINE) IN NACL 1000-0.9 UT/500ML-% IV SOLN
INTRAVENOUS | Status: DC | PRN
  Administered 2024-05-23: 13:00:00 1000 mL

## 2024-05-23 MED ORDER — MIDAZOLAM HCL 2 MG/ML PO SYRP
8.0000 mg | ORAL_SOLUTION | Freq: Once | ORAL | Status: AC | PRN
  Administered 2024-05-23: 14:00:00 8 mg via ORAL

## 2024-05-23 MED ORDER — HEPARIN SODIUM (PORCINE) 1000 UNIT/ML IJ SOLN
INTRAMUSCULAR | Status: DC | PRN
  Administered 2024-05-23: 13:00:00 5000 [IU] via INTRAVENOUS

## 2024-05-23 MED ORDER — HYDROMORPHONE HCL 1 MG/ML IJ SOLN
1.0000 mg | Freq: Once | INTRAMUSCULAR | Status: AC | PRN
  Administered 2024-05-23: 14:00:00 1 mg via INTRAVENOUS

## 2024-05-23 MED ORDER — ASPIRIN 81 MG PO TBEC: 81.0000 mg | DELAYED_RELEASE_TABLET | Freq: Every day | ORAL | 2 refills | Status: AC

## 2024-05-23 MED ORDER — SODIUM CHLORIDE 0.9% FLUSH: 3.0000 mL | INTRAVENOUS | Status: DC | PRN

## 2024-05-23 MED ORDER — IODIXANOL 320 MG/ML IV SOLN
INTRAVENOUS | Status: DC | PRN
  Administered 2024-05-23: 13:00:00 50 mL

## 2024-05-23 MED ORDER — CEFAZOLIN SODIUM-DEXTROSE 2-4 GM/100ML-% IV SOLN
2.0000 g | INTRAVENOUS | Status: AC
  Administered 2024-05-23: 13:00:00 2 g via INTRAVENOUS

## 2024-05-23 MED ORDER — ASPIRIN 81 MG PO TBEC: 81.0000 mg | DELAYED_RELEASE_TABLET | Freq: Every day | ORAL | Status: DC

## 2024-05-23 MED ORDER — HYDROMORPHONE HCL 1 MG/ML IJ SOLN
INTRAMUSCULAR | Status: AC
  Filled 2024-05-23: qty 1

## 2024-05-23 MED ORDER — LIDOCAINE-EPINEPHRINE (PF) 1 %-1:200000 IJ SOLN
INTRAMUSCULAR | Status: DC | PRN
  Administered 2024-05-23: 13:00:00 10 mL

## 2024-05-23 MED ORDER — CEFAZOLIN SODIUM-DEXTROSE 2-4 GM/100ML-% IV SOLN
INTRAVENOUS | Status: AC
  Filled 2024-05-23: qty 100

## 2024-05-23 MED ORDER — FAMOTIDINE 20 MG PO TABS: 40.0000 mg | ORAL_TABLET | Freq: Once | ORAL | Status: DC | PRN

## 2024-05-23 MED ORDER — ONDANSETRON HCL 4 MG/2ML IJ SOLN: 4.0000 mg | Freq: Four times a day (QID) | INTRAMUSCULAR | Status: DC | PRN

## 2024-05-23 MED ORDER — SODIUM CHLORIDE 0.9% FLUSH: 3.0000 mL | Freq: Two times a day (BID) | INTRAVENOUS | Status: DC

## 2024-05-23 MED ORDER — FENTANYL CITRATE (PF) 100 MCG/2ML IJ SOLN
INTRAMUSCULAR | Status: AC
  Filled 2024-05-23: qty 4

## 2024-05-23 MED ORDER — METHYLPREDNISOLONE SODIUM SUCC 125 MG IJ SOLR: 125.0000 mg | Freq: Once | INTRAMUSCULAR | Status: DC | PRN

## 2024-05-23 MED ORDER — FENTANYL CITRATE (PF) 100 MCG/2ML IJ SOLN
INTRAMUSCULAR | Status: DC | PRN
  Administered 2024-05-23 (×3): 25 ug via INTRAVENOUS
  Administered 2024-05-23: 13:00:00 50 ug via INTRAVENOUS

## 2024-05-23 MED ORDER — HEPARIN SODIUM (PORCINE) 1000 UNIT/ML IJ SOLN
INTRAMUSCULAR | Status: AC
  Filled 2024-05-23: qty 10

## 2024-05-23 MED ORDER — ASPIRIN 81 MG PO TBEC
DELAYED_RELEASE_TABLET | ORAL | Status: AC
  Administered 2024-05-23: 15:00:00 81 mg via ORAL
  Filled 2024-05-23: qty 1

## 2024-05-23 MED ORDER — HYDRALAZINE HCL 20 MG/ML IJ SOLN: 5.0000 mg | INTRAMUSCULAR | Status: DC | PRN

## 2024-05-23 MED ORDER — CLOPIDOGREL BISULFATE 75 MG PO TABS: 75.0000 mg | ORAL_TABLET | Freq: Every day | ORAL | Status: DC

## 2024-05-23 MED ORDER — HYDROMORPHONE HCL 1 MG/ML IJ SOLN
INTRAMUSCULAR | Status: AC
  Filled 2024-05-23: qty 0.5

## 2024-05-23 SURGICAL SUPPLY — 14 items
BALLOON LUTONIX 7X80X130 (BALLOONS) IMPLANT
BALLOON LUTONIX DCB 6X60X130 (BALLOONS) IMPLANT
CATH ANGIO 5F PIGTAIL 65CM (CATHETERS) IMPLANT
COVER PROBE ULTRASOUND 5X96 (MISCELLANEOUS) IMPLANT
DEVICE PRESTO INFLATION (MISCELLANEOUS) IMPLANT
DEVICE STARCLOSE SE CLOSURE (Vascular Products) IMPLANT
GLIDEWIRE ADV .035X260CM (WIRE) IMPLANT
PACK ANGIOGRAPHY (CUSTOM PROCEDURE TRAY) ×1 IMPLANT
PANNUS RETENTION SYSTEM 2 PAD (MISCELLANEOUS) IMPLANT
SHEATH ANL2 6FRX45 HC (SHEATH) IMPLANT
SHEATH BRITE TIP 5FRX11 (SHEATH) IMPLANT
SYR MEDRAD MARK 7 150ML (SYRINGE) IMPLANT
TUBING CONTRAST HIGH PRESS 72 (TUBING) IMPLANT
WIRE J 3MM .035X145CM (WIRE) IMPLANT

## 2024-05-23 NOTE — H&P (Signed)
 [facility] Admission History & Physical  MRN : [id number]  Martin Riddle is a 53 y.o. (Aug 15, 1970) male who presents with chief complaint of No chief complaint on file. SABRA  History of Present Illness: Patient presents for left lower extremity angiogram.  Initially tried to schedule this several months ago.  Has nonhealing ulcerations on the left foot now that were not present at his last visit.  Previous history of right AKA and previous intervention in the left popliteal artery.  Current Facility-Administered Medications  Medication Dose Route Frequency Provider Last Rate Last Admin   0.9 %  sodium chloride  infusion   Intravenous Continuous Brown, Fallon E, NP       ceFAZolin  (ANCEF ) IVPB 2g/100 mL premix  2 g Intravenous 30 min Pre-Op Brown, Fallon E, NP       diphenhydrAMINE  (BENADRYL ) injection 50 mg  50 mg Intravenous Once PRN Brown, Fallon E, NP       famotidine  (PEPCID ) tablet 40 mg  40 mg Oral Once PRN Brown, Fallon E, NP       HYDROmorphone  (DILAUDID ) injection 1 mg  1 mg Intravenous Once PRN Brown, Fallon E, NP       methylPREDNISolone  sodium succinate (SOLU-MEDROL ) 125 mg/2 mL injection 125 mg  125 mg Intravenous Once PRN Brown, Fallon E, NP       midazolam  (VERSED ) 2 MG/ML syrup 8 mg  8 mg Oral Once PRN Brown, Fallon E, NP       ondansetron  (ZOFRAN ) injection 4 mg  4 mg Intravenous Q6H PRN Brown, Fallon E, NP        Past Medical History:  Diagnosis Date   Diabetes mellitus without complication (HCC)    Hypercholesteremia    Hypertension    Pancreatitis     Past Surgical History:  Procedure Laterality Date   AMPUTATION Right 10/28/2023   Procedure: AMPUTATION, ABOVE KNEE;  Surgeon: Jama Cordella MATSU, MD;  Location: [facility] ORS;  Service: Vascular;  Laterality: Right;   ANKLE SURGERY Left 2001,2003   BACK SURGERY  2003,2012   LOWER EXTREMITY ANGIOGRAPHY Left 11/05/2023   Procedure: Lower Extremity Angiography;  Surgeon: Marea Selinda RAMAN, MD;   Location: [facility] INVASIVE CV LAB;  Service: Cardiovascular;  Laterality: Left;     Social History[1]   Family History  Problem Relation Age of Onset   Heart disease Mother    Diabetes Mother    Diabetes Sister     Allergies[2]   REVIEW OF SYSTEMS (Negative unless checked)  Constitutional: [] Weight loss  [] Fever  [] Chills Cardiac: [] Chest pain   [] Chest pressure   [] Palpitations   [] Shortness of breath when laying flat   [] Shortness of breath at rest   [x] Shortness of breath with exertion. Vascular:  [] Pain in legs with walking   [] Pain in legs at rest   [] Pain in legs when laying flat   [] Claudication   [] Pain in feet when walking  [] Pain in feet at rest  [] Pain in feet when laying flat   [] History of DVT   [] Phlebitis   [x] Swelling in legs   [] Varicose veins   [x] Non-healing ulcers Pulmonary:   [] Uses home oxygen   [] Productive cough   [] Hemoptysis   [] Wheeze  [] COPD   [] Asthma Neurologic:  [] Dizziness  [] Blackouts   [] Seizures   [] History of stroke   [] History of TIA  [] Aphasia   [] Temporary blindness   [] Dysphagia   [] Weakness or numbness in arms   [] Weakness or numbness in legs Musculoskeletal:  [  x]Arthritis   [] Joint swelling   [x] Joint pain   [] Low back pain Hematologic:  [] Easy bruising  [] Easy bleeding   [] Hypercoagulable state   [x] Anemic  [] Hepatitis Gastrointestinal:  [] Blood in stool   [] Vomiting blood  [] Gastroesophageal reflux/heartburn   [] Difficulty swallowing. Genitourinary:  [] Chronic kidney disease   [] Difficult urination  [] Frequent urination  [] Burning with urination   [] Blood in urine Skin:  [] Rashes   [x] Ulcers   [x] Wounds Psychological:  [] History of anxiety   []  History of major depression.  Physical Examination  There were no vitals filed for this visit. There is no height or weight on file to calculate BMI. Gen: WD/WN, NAD Head: [place]/AT, No temporalis wasting.  Ear/Nose/Throat: Hearing grossly intact, nares w/o erythema or drainage, oropharynx w/o  Erythema/Exudate,  Eyes: Conjunctiva clear, sclera non-icteric Neck: Trachea midline.  No JVD.  Pulmonary:  Good air movement, respirations not labored, no use of accessory muscles.  Cardiac: RRR, normal S1, S2. Vascular:  Vessel Right Left  Radial Palpable Palpable               Musculoskeletal: M/S 5/5 throughout.  Right AKA. 1-2+ LLE edema Neurologic: Sensation grossly intact in extremities.  Symmetrical.  Speech is fluent. Motor exam as listed above. Psychiatric: Judgment intact, Mood & affect appropriate for pt's clinical situation. Dermatologic: left foot dressed Lymph : No Cervical, Axillary, or Inguinal lymphadenopathy.     CBC Lab Results  Component Value Date   WBC 5.0 11/23/2023   HGB 8.7 (L) 11/23/2023   HCT 28.5 (L) 11/23/2023   MCV 91.6 11/23/2023   PLT 138 (L) 11/23/2023    BMET    Component Value Date/Time   NA 137 11/23/2023 0254   NA 135 (L) 03/15/2014 0339   K 4.0 11/23/2023 0254   K 3.1 (L) 03/15/2014 0339   CL 98 11/23/2023 0254   CL 104 03/15/2014 0339   CO2 29 11/23/2023 0254   CO2 24 03/15/2014 0339   GLUCOSE 177 (H) 11/23/2023 0254   GLUCOSE 127 (H) 03/15/2014 0339   BUN 29 (H) 11/23/2023 0254   BUN 9 03/15/2014 0339   CREATININE 0.68 11/23/2023 0254   CREATININE 0.64 03/15/2014 0339   CALCIUM  9.7 11/23/2023 0254   CALCIUM  8.4 (L) 03/15/2014 0339   GFRNONAA >60 11/23/2023 0254   GFRNONAA >60 03/15/2014 0339   GFRAA >60 02/28/2018 0429   GFRAA >60 03/15/2014 0339   CrCl cannot be calculated (Patient's most recent lab result is older than the maximum 21 days allowed.).  COAG Lab Results  Component Value Date   INR 1.4 (H) 11/20/2023   INR 1.3 (H) 11/19/2023   INR 1.2 10/26/2023    Radiology No results found.   Assessment/Plan 1.  PAD with ulceration left lower extremity.  Limb threatening situation.  For angiogram today.  Risks and benefits are discussed.  Previous intervention in the left lower extremity earlier this year  with suspicion for recurrent disease. 2.  Diabetes.  On outpatient medications and blood glucose control important in reducing the progression of atherosclerotic disease. Also, involved in wound healing. On appropriate medications. 3.  Hypertension.  Stable on outpatient medications and blood pressure control important in reducing the progression of atherosclerotic disease. On appropriate oral medications.    Selinda Gu, MD  05/23/2024 11:19 AM        [1]  Social History Tobacco Use   Smoking status: Former    Types: Cigarettes   Smokeless tobacco: Current    Types: [employer]  Vaping Use   Vaping status: Never Used  Substance Use Topics   Alcohol use: No    Alcohol/week: 0.0 standard drinks of alcohol   Drug use: No  [2]  Allergies Allergen Reactions   Morphine Other (See Comments)    headache     A HEADACHE AND SWEATING AND LETHARGIC headache  headache, ,  A HEADACHE AND SWEATING AND LETHARGIC headache   Morphine And Codeine Other (See Comments)    headache   Other Other (See Comments)    A HEADACHE AND SWEATING AND LETHARGIC    headache  A HEADACHE AND SWEATING AND LETHARGIC, , headache

## 2024-05-23 NOTE — Progress Notes (Signed)
 Patient is ready for discharge, on room air O2 sats 88-90%, wife-Wendy, says that's normal for him and that he should be on oxygen and a cpap for sleep apnea, but he doesn't want that. He states he feels fine and is ready to go home. Sats are stable at that range, will D/C.

## 2024-05-23 NOTE — Op Note (Signed)
 [place] [facility]  Percutaneous Study/Intervention Procedural Note   Date of Surgery: 05/23/2024  Surgeon(s):Alauna Hayden    Assistants:none  Pre-operative Diagnosis: PAD with ulceration left lower extremity  Post-operative diagnosis:  Same  Procedure(s) Performed:             1.  Ultrasound guidance for vascular access right femoral artery             2.  Catheter placement into left common femoral artery from right femoral approach             3.  Aortogram and selective left lower extremity angiogram             4.  Percutaneous transluminal angioplasty of left popliteal artery with 6 and 7 mm diameter Lutonix drug-coated angioplasty balloons             5.  StarClose closure device right femoral artery  EBL: 5 cc  Contrast: 50 cc  Fluoro Time: 4.1 minutes  Moderate Conscious Sedation Time: approximately 22 minutes using 3.5 mg of Versed  and 125 mcg of Fentanyl               Indications:  Patient is a 53 y.o.male with a history of peripheral arterial disease having already lost his right leg and now having ulcerations on the left foot.  He had a previous left popliteal intervention earlier this year. The patient is brought in for angiography for further evaluation and potential treatment.  Due to the limb threatening nature of the situation, angiogram was performed for attempted limb salvage. The patient is aware that if the procedure fails, amputation would be expected.  The patient also understands that even with successful revascularization, amputation may still be required due to the severity of the situation.  Risks and benefits are discussed and informed consent is obtained.   Procedure:  The patient was identified and appropriate procedural time out was performed.  The patient was then placed supine on the table and prepped and draped in the usual sterile fashion. Moderate conscious sedation was administered during a face to face encounter with the patient  throughout the procedure with my supervision of the RN administering medicines and monitoring the patient's vital signs, pulse oximetry, telemetry and mental status throughout from the start of the procedure until the patient was taken to the recovery room. Ultrasound was used to evaluate the right common femoral artery.  It was patent .  A digital ultrasound image was acquired.  A Seldinger needle was used to access the right common femoral artery under direct ultrasound guidance and a permanent image was performed.  A 0.035 J wire was advanced without resistance and a 5Fr sheath was placed.  Pigtail catheter was placed into the aorta and an AP aortogram was performed. This demonstrated normal renal arteries and normal aorta and iliac segments without significant stenosis although imaging was difficult due to the spinal hardware. I then crossed the aortic bifurcation and advanced to the left femoral head. Selective left lower extremity angiogram was then performed. This demonstrated common femoral artery, profunda femoris artery, and superficial femoral artery.  The previously placed popliteal artery stent had narrowing both at the top and the bottom.  The top had about a 70% stenosis and the bottom had about a 60% stenosis.  There was then a normalization of the distal popliteal artery and a typical tibial trifurcation with normal three-vessel runoff without significant stenosis in the tibial vessels. It was felt that it was  in the patient's best interest to proceed with intervention after these images to avoid a second procedure and a larger amount of contrast and fluoroscopy based off of the findings from the initial angiogram. The patient was systemically heparinized and a 6 French Ansell sheath was then placed over the [employer] wire. I then used a Kumpe catheter and the advantage wire to easily cross the stenosis in the popliteal artery.  I first used a 6 mm diameter by 6 cm length Lutonix drug-coated  angioplasty balloon inflated to 14 atm for 1 minute.  I then upsized to a 7 mm diameter by 8 cm length Lutonix drug-coated angioplasty balloon inflated this to 10 atm for 1 minute.  Completion imaging showed marked improvement with only about a 10 to 15% residual stenosis at both the proximal and distal edges of the previously placed stent after angioplasty. I elected to terminate the procedure. The sheath was removed and StarClose closure device was deployed in the right femoral artery with excellent hemostatic result. The patient was taken to the recovery room in stable condition having tolerated the procedure well.  Findings:               Aortogram:  This demonstrated normal renal arteries and normal aorta and iliac segments without significant stenosis although imaging was difficult due to the spinal hardware.             Left lower Extremity:  This demonstrated common femoral artery, profunda femoris artery, and superficial femoral artery.  The previously placed popliteal artery stent had narrowing both at the top and the bottom.  The top had about a 70% stenosis and the bottom had about a 60% stenosis.  There was then a normalization of the distal popliteal artery and a typical tibial trifurcation with normal three-vessel runoff without significant stenosis in the tibial vessels.   Disposition: Patient was taken to the recovery room in stable condition having tolerated the procedure well.  Complications: None  Selinda Gu 05/23/2024 1:15 PM   This note was created with Dragon Medical transcription system. Any errors in dictation are purely unintentional.

## 2024-05-30 ENCOUNTER — Ambulatory Visit (INDEPENDENT_AMBULATORY_CARE_PROVIDER_SITE_OTHER): Admitting: Vascular Surgery

## 2024-06-08 ENCOUNTER — Other Ambulatory Visit (INDEPENDENT_AMBULATORY_CARE_PROVIDER_SITE_OTHER): Payer: Self-pay | Admitting: Vascular Surgery

## 2024-06-08 DIAGNOSIS — Z9889 Other specified postprocedural states: Secondary | ICD-10-CM

## 2024-06-21 ENCOUNTER — Encounter (INDEPENDENT_AMBULATORY_CARE_PROVIDER_SITE_OTHER): Payer: Self-pay | Admitting: Nurse Practitioner

## 2024-06-21 ENCOUNTER — Other Ambulatory Visit (INDEPENDENT_AMBULATORY_CARE_PROVIDER_SITE_OTHER): Payer: Self-pay | Admitting: Nurse Practitioner

## 2024-06-21 ENCOUNTER — Ambulatory Visit (INDEPENDENT_AMBULATORY_CARE_PROVIDER_SITE_OTHER)

## 2024-06-21 ENCOUNTER — Ambulatory Visit (INDEPENDENT_AMBULATORY_CARE_PROVIDER_SITE_OTHER): Admitting: Nurse Practitioner

## 2024-06-21 VITALS — BP 150/89 | HR 105 | Resp 18 | Ht 77.5 in

## 2024-06-21 DIAGNOSIS — I10 Essential (primary) hypertension: Secondary | ICD-10-CM

## 2024-06-21 DIAGNOSIS — R21 Rash and other nonspecific skin eruption: Secondary | ICD-10-CM | POA: Diagnosis not present

## 2024-06-21 DIAGNOSIS — Z9889 Other specified postprocedural states: Secondary | ICD-10-CM | POA: Diagnosis not present

## 2024-06-21 DIAGNOSIS — Z794 Long term (current) use of insulin: Secondary | ICD-10-CM

## 2024-06-21 DIAGNOSIS — E11610 Type 2 diabetes mellitus with diabetic neuropathic arthropathy: Secondary | ICD-10-CM

## 2024-06-21 DIAGNOSIS — I872 Venous insufficiency (chronic) (peripheral): Secondary | ICD-10-CM | POA: Diagnosis not present

## 2024-06-21 DIAGNOSIS — I7025 Atherosclerosis of native arteries of other extremities with ulceration: Secondary | ICD-10-CM

## 2024-06-21 DIAGNOSIS — L97522 Non-pressure chronic ulcer of other part of left foot with fat layer exposed: Secondary | ICD-10-CM | POA: Diagnosis not present

## 2024-06-21 DIAGNOSIS — I739 Peripheral vascular disease, unspecified: Secondary | ICD-10-CM | POA: Diagnosis not present

## 2024-06-21 MED ORDER — NYSTATIN 100000 UNIT/GM EX POWD: 1.0000 | Freq: Three times a day (TID) | CUTANEOUS | 0 refills | Status: AC

## 2024-06-21 MED ORDER — OXYCODONE HCL 5 MG PO TABS: 5.0000 mg | ORAL_TABLET | Freq: Three times a day (TID) | ORAL | 0 refills | Status: AC | PRN

## 2024-06-21 NOTE — Progress Notes (Signed)
 "  Subjective:    Patient ID: Martin Riddle, male    DOB: 28-Nov-1970, 54 y.o.   MRN: [id number] Chief Complaint  Patient presents with   Follow-up    Follow up ABI     HPI  Discussed the use of AI scribe software for clinical note transcription with the patient, who gave verbal consent to proceed.  History of Present Illness Martin Riddle is a 54 year old male with type 2 diabetes who presents for evaluation of a chronic, painful left foot ulcer and a non-healing left groin wound following recent angiogram.  He has had an ulcer on the plantar aspect of the left foot for 2-3 months. The wound was initially smaller and erythematous, but has progressively enlarged, now extending circumferentially around the foot and involving the lateral aspect and little toe. He experiences persistent, significant pain localized to the lateral foot and little toe, which has been present for several months and is refractory to over-the-counter analgesics including naproxen, acetaminophen , and ibuprofen . The pain disrupts sleep and requires elevation of the foot. He performs regular dressing changes at home. He was evaluated once at the [facility] ankle center but did not return, as he was advised to obtain an angiogram prior to follow-up. His last prescription pain medication was received approximately three months ago.  He has a persistent open wound at the left groin access site following an angiogram performed one month ago.  Unfortunately his angiogram procedure was delayed as a truck for several months to reach the patient before we were actually able to get him scheduled.  The wound is described as a 'hole' rather than a scab and has not demonstrated significant healing. He maintains local wound care with cleaning and drying, but healing remains delayed.  He reports intermittent blisters on the left leg that rupture, as well as episodes of swelling and erythema that resolve spontaneously after several  days. He denies current systemic symptoms.  He has developed a rash on the legs and abdomen over the past week, without associated systemic symptoms. He has not been taking Zyrtec regularly, but has used it or Claritin  in the past.    Results Diagnostic Lower extremity angiogram (06/21/2024): Ankle pressure 1.20, toe pressure 1.15; perfusion sufficient for wound healing  Wound culture of left foot ulcer Specimen collected from left foot ulcer for microbiological culture to assess for infection.   Review of Systems     Objective:   Physical Exam  Physical Exam GENITOURINARY: Groin wound larger than typical post-angiogram. SKIN: Rash present on leg and stomach. Permanent staining on skin from previous cellulitis. Yeast noted in groin area  BP (!) 150/89 (BP Location: Right Arm, Patient Position: Sitting, Cuff Size: Normal)   Pulse (!) 105   Resp 18   Ht 6' 5.5 (1.969 m)   BMI 41.37 kg/m   Past Medical History:  Diagnosis Date   Diabetes mellitus without complication (HCC)    Hypercholesteremia    Hypertension    Pancreatitis     Social History   Socioeconomic History   Marital status: Married    Spouse name: Not on file   Number of children: Not on file   Years of education: Not on file   Highest education level: Not on file  Occupational History   Not on file  Tobacco Use   Smoking status: Former    Types: Cigarettes   Smokeless tobacco: Current    Types: Chew  Vaping Use   Vaping  status: Never Used  Substance and Sexual Activity   Alcohol use: No    Alcohol/week: 0.0 standard drinks of alcohol   Drug use: No   Sexual activity: Yes    Partners: Male    Birth control/protection: None  Other Topics Concern   Not on file  Social History Narrative   Not on file   Social Drivers of Health   Tobacco Use: High Risk (06/21/2024)   Patient History    Smoking Tobacco Use: Former    Smokeless Tobacco Use: Current    Passive Exposure: Not on file   Financial Resource Strain: Not on file  Food Insecurity: Patient Declined (11/20/2023)   Epic    Worried About Programme Researcher, Broadcasting/film/video in the Last Year: Patient declined    Barista in the Last Year: Patient declined  Transportation Needs: Patient Declined (11/20/2023)   Epic    Lack of Transportation (Medical): Patient declined    Lack of Transportation (Non-Medical): Patient declined  Physical Activity: Not on file  Stress: Not on file  Social Connections: Patient Declined (10/27/2023)   Social Connection and Isolation Panel    Frequency of Communication with Friends and Family: Patient declined    Frequency of Social Gatherings with Friends and Family: Patient declined    Attends Religious Services: Patient declined    Active Member of Clubs or Organizations: Patient declined    Attends Banker Meetings: Patient declined    Marital Status: Patient declined  Intimate Partner Violence: Not At Risk (11/20/2023)   Epic    Fear of Current or Ex-Partner: No    Emotionally Abused: No    Physically Abused: No    Sexually Abused: No  Depression (PHQ2-9): Not on file  Alcohol Screen: Not on file  Housing: Unknown (11/20/2023)   Epic    Unable to Pay for Housing in the Last Year: Patient declined    Number of Times Moved in the Last Year: 0    Homeless in the Last Year: Patient declined  Utilities: Patient Declined (11/20/2023)   Epic    Threatened with loss of utilities: Patient declined  Health Literacy: Not on file    Past Surgical History:  Procedure Laterality Date   AMPUTATION Right 10/28/2023   Procedure: AMPUTATION, ABOVE KNEE;  Surgeon: Jama Cordella MATSU, MD;  Location: [facility];  Service: Vascular;  Laterality: Right;   ANKLE SURGERY Left 2001,2003   BACK SURGERY  2003,2012   LOWER EXTREMITY ANGIOGRAPHY Left 11/05/2023   Procedure: Lower Extremity Angiography;  Surgeon: Marea Selinda RAMAN, MD;  Location: [facility];  Service: Cardiovascular;  Laterality:  Left;   LOWER EXTREMITY ANGIOGRAPHY Left 05/23/2024   Procedure: Lower Extremity Angiography;  Surgeon: Marea Selinda RAMAN, MD;  Location: [facility];  Service: Cardiovascular;  Laterality: Left;    Family History  Problem Relation Age of Onset   Heart disease Mother    Diabetes Mother    Diabetes Sister     Allergies[1]     Latest Ref Rng & Units 11/23/2023    2:54 AM 11/22/2023    4:52 AM 11/21/2023    4:48 AM  CBC  WBC 4.0 - 10.5 K/uL 5.0  5.2  5.1   Hemoglobin 13.0 - 17.0 g/dL 8.7  8.8  9.0   Hematocrit 39.0 - 52.0 % 28.5  28.6  29.5   Platelets 150 - 400 K/uL 138  132  152       CMP  Component Value Date/Time   NA 137 11/23/2023 0254   NA 135 (L) 03/15/2014 0339   K 4.0 11/23/2023 0254   K 3.1 (L) 03/15/2014 0339   CL 98 11/23/2023 0254   CL 104 03/15/2014 0339   CO2 29 11/23/2023 0254   CO2 24 03/15/2014 0339   GLUCOSE 177 (H) 11/23/2023 0254   GLUCOSE 127 (H) 03/15/2014 0339   BUN 19 05/23/2024 1200   BUN 9 03/15/2014 0339   CREATININE 0.72 05/23/2024 1200   CREATININE 0.64 03/15/2014 0339   CALCIUM  9.7 11/23/2023 0254   CALCIUM  8.4 (L) 03/15/2014 0339   PROT 6.9 11/20/2023 0453   PROT 8.8 (H) 03/11/2014 0620   ALBUMIN  3.0 (L) 11/20/2023 0453   ALBUMIN  3.2 (L) 03/11/2014 0620   AST 26 11/20/2023 0453   AST 42 (H) 03/11/2014 0620   ALT 16 11/20/2023 0453   ALT SEE COMMENT 03/11/2014 0620   ALKPHOS 105 11/20/2023 0453   ALKPHOS 90 03/11/2014 0620   BILITOT 1.3 (H) 11/20/2023 0453   BILITOT 0.8 03/11/2014 0620   GFRNONAA >60 05/23/2024 1200   GFRNONAA >60 03/15/2014 0339     No results found.     Assessment & Plan:   1. Atherosclerosis of native arteries of the extremities with ulceration (HCC) (Primary) Non-healing groin wound post-angiogram Persistent groin wound post-angiogram, larger than typical, clean base. Delayed healing likely due to moist environment; possible yeast involvement. - Applied Aquacel dressing; instructed on changes  every other day or post-shower. - Prescribed nystatin  powder for yeast involvement. - Instructed to keep area clean and dry. - Scheduled follow-up in two weeks. -ABIs indicate successful procedure, should havee adequate wound healing ability    2. Type 2 diabetes mellitus with diabetic neuropathic arthropathy, with long-term current use of insulin  (HCC) Continue hypoglycemic medications as already ordered, these medications have been reviewed and there are no changes at this time.  Hgb A1C to be monitored as already arranged by primary service  3. Primary hypertension Continue antihypertensive medications as already ordered, these medications have been reviewed and there are no changes at this time.  4. 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 Adequate blood flow post-angiogram. Pain significant and refractory to OTC analgesics. Healing multifactorial. - Obtained wound culture for antibiotic guidance. - Referred to podiatry for urgent evaluation, wound care, possible debridement, and advanced management. - Prescribed short-term pain medication; coordinated long-term management with podiatry/pain clinic. - Referred to pain clinic for further management. - Instructed to attend podiatry appointments and follow up closely.  - Ambulatory referral to Podiatry  5. Venous insufficiency of left leg  Recurrent cellulitis of left lower extremity Recurrent cellulitis with intermittent erythema and blisters, chronic skin changes. No acute cellulitis, concern for low-grade infection. - Awaiting wound culture results before antibiotics. - Plan to prescribe antibiotics based on culture results if infection confirmed.  6. Rash and nonspecific skin eruption Dermatitis possibly related to contrast Rash on legs and abdomen, possibly  delayed contrast reaction. Differential includes allergic or irritant dermatitis. Avoided systemic steroids due to diabetes. - Recommended OTC antihistamines (cetirizine or loratadine ) twice daily for one week, then once daily. - Advised against systemic steroids.   Assessment and Plan Assessment & Plan           Medications Ordered Prior to Encounter[2]  There are no Patient Instructions on file for this visit.  Return in about 1 week (around 06/28/2024) for  , Wound Check fb.   Orvin FORBES Daring, NP        [1]  Allergies Allergen Reactions   Morphine Other (See Comments)    headache     A HEADACHE AND SWEATING AND LETHARGIC headache  headache, ,  A HEADACHE AND SWEATING AND LETHARGIC headache  [2]  Current Outpatient Medications on File Prior to Visit  Medication Sig Dispense Refill   acetaminophen  (TYLENOL ) 500 MG tablet Take 1,000 mg by mouth every 6 (six) hours as needed for mild pain or moderate pain.     aspirin  EC 81 MG tablet Take 1 tablet (81 mg total) by mouth daily. Swallow whole. 150 tablet 2   atorvastatin  (LIPITOR) 20 MG tablet Take 1 tablet (20 mg total) by mouth daily. 90 tablet 1   busPIRone  (BUSPAR ) 30 MG tablet Take 30 mg by mouth 3 (three) times daily.     cetirizine (ZYRTEC) 10 MG tablet Take 10 mg by mouth daily.     cloNIDine  (CATAPRES ) 0.2 MG tablet Take 0.2 mg by mouth 3 (three) times daily.     clopidogrel  (PLAVIX ) 75 MG tablet Take 1 tablet (75 mg total) by mouth daily. 30 tablet 11   DULoxetine  (CYMBALTA ) 60 MG capsule Take 60 mg by mouth daily.     insulin  aspart (NOVOLOG ) 100 UNIT/ML injection Inject 30 Units into the skin 3 (three) times daily with meals.     insulin  aspart (NOVOLOG ) 100 UNIT/ML injection Inject 0-20 Units into the skin 4 (four) times daily -  before meals and at bedtime. CBG 70 - 120: 0 units CBG 121 - 150: 3 units CBG 151 - 200: 4 units CBG 201 - 250: 7 units CBG 251 - 300: 11 units CBG 301 - 350: 15 units CBG 351 -  400: 20 units     losartan -hydrochlorothiazide  (HYZAAR) 100-12.5 MG tablet Take 1 tablet by mouth daily. Pt unsure of dose     Multiple Vitamin (MULTIVITAMIN WITH MINERALS) TABS tablet Take 1 tablet by mouth daily. 90 tablet 1   nitroGLYCERIN  (NITROSTAT ) 0.4 MG SL tablet Place under the tongue.     polyethylene glycol (MIRALAX  / GLYCOLAX ) packet Take 17 g by mouth daily. 30 each 0   pregabalin  (LYRICA ) 150 MG capsule Take 1 capsule (150 mg total) by mouth 2 (two) times daily. 60 capsule 3   pregabalin  (LYRICA ) 200 MG capsule Take 1 capsule (200 mg total) by mouth 2 (two) times daily. 60 capsule 0   rOPINIRole (REQUIP) 1 MG tablet Take 1 mg by mouth at bedtime.     insulin  glargine-yfgn (SEMGLEE ) 100 UNIT/ML injection Inject 0.9 mLs (90 Units total) into the skin 2 (two) times daily. (Patient not taking: Reported on 06/21/2024)     No current facility-administered medications on file prior to visit.   "

## 2024-06-21 NOTE — Progress Notes (Signed)
 Per Orvin Np  groin wound cleansed with wound cleanser, dried with guaze, covered with Aquacel gauze and paper tape. Left outer foot wound cleansed with wound cleanser covered with Aquacel ABD pad and wrapped with Kerlix and paper tape.   Wife to do wound care changes at home.   Dois Seip CMA

## 2024-06-22 ENCOUNTER — Ambulatory Visit: Admitting: Nurse Practitioner

## 2024-06-23 LAB — VAS US ABI WITH/WO TBI: Left ABI: 1.2

## 2024-06-28 ENCOUNTER — Encounter (INDEPENDENT_AMBULATORY_CARE_PROVIDER_SITE_OTHER): Payer: Self-pay | Admitting: Nurse Practitioner

## 2024-06-28 ENCOUNTER — Ambulatory Visit (INDEPENDENT_AMBULATORY_CARE_PROVIDER_SITE_OTHER): Admitting: Nurse Practitioner

## 2024-06-28 VITALS — BP 133/79 | HR 94 | Resp 17 | Ht 77.0 in

## 2024-06-28 DIAGNOSIS — Z89611 Acquired absence of right leg above knee: Secondary | ICD-10-CM | POA: Diagnosis not present

## 2024-06-28 DIAGNOSIS — R21 Rash and other nonspecific skin eruption: Secondary | ICD-10-CM

## 2024-06-28 DIAGNOSIS — Z9889 Other specified postprocedural states: Secondary | ICD-10-CM

## 2024-06-28 DIAGNOSIS — I739 Peripheral vascular disease, unspecified: Secondary | ICD-10-CM | POA: Diagnosis not present

## 2024-06-28 DIAGNOSIS — G547 Phantom limb syndrome without pain: Secondary | ICD-10-CM

## 2024-06-28 LAB — AEROBIC CULTURE

## 2024-06-28 MED ORDER — HYDROCODONE-ACETAMINOPHEN 5-325 MG PO TABS: 1.0000 | ORAL_TABLET | Freq: Four times a day (QID) | ORAL | 0 refills | Status: AC | PRN

## 2024-06-30 ENCOUNTER — Ambulatory Visit: Admitting: Podiatry

## 2024-06-30 ENCOUNTER — Encounter (INDEPENDENT_AMBULATORY_CARE_PROVIDER_SITE_OTHER): Payer: Self-pay | Admitting: Nurse Practitioner

## 2024-06-30 MED ORDER — SULFAMETHOXAZOLE-TRIMETHOPRIM 800-160 MG PO TABS: 1.0000 | ORAL_TABLET | Freq: Two times a day (BID) | ORAL | 0 refills | Status: AC

## 2024-06-30 NOTE — Progress Notes (Signed)
 "  Subjective:    Patient ID: Martin Riddle, male    DOB: [birth year], 54 y.o.   MRN: [id number] Chief Complaint  Patient presents with   Follow-up    Return in about 1 week     HPI  Discussed the use of AI scribe software for clinical note transcription with the patient, who gave verbal consent to proceed.  History of Present Illness Martin Riddle is a 54 year old male with diabetes and prior right above-knee amputation who presents for follow-up of persistent left foot ulcer, left groin wound, and new rash.  Over the past 2-3 weeks, he has developed a pruritic rash involving the arms, legs, and abdomen, predominantly in exposed areas. The rash is characterized by white papules with occasional mild exudate. He suspects a possible contact dermatitis related to a new blanket, and has used topical hydrocortisone and oral cetirizine with partial relief. He avoids excoriation to prevent exacerbation. The rash onset followed a recent angiogram, raising concern for a possible contrast reaction, though the distribution is more prominent on the arms.  He continues to manage a persistent left groin wound, which has decreased in size over the past week and is now a pinpoint opening. He uses a white felt gauze dressing and maintains local hygiene. Wound culture is pending organism identification, with current report showing heavy growth. He is also being monitored for a small left foot ulcer, with podiatry follow-up scheduled. He remains adherent to wound care.  He experiences significant phantom limb pain following right above-knee amputation, described as persistent throbbing pain radiating down the leg and into the ankle, similar to a big toothache. He has a history of degenerative spine disease and multiple prior back surgeries, with limited benefit from epidural steroid injections and tramadol. Oxycodone  has provided improved sleep. He reports difficulty accessing pain management services that  offer pharmacologic therapy.  He recently received a prosthetic limb but has only worn it once due to concerns about knee stability. He is interested in resuming physical therapy for gait training and mobility, specifically requesting a prior therapist. He expresses concern about weight gain due to limited mobility and difficulty with exercise.    Results Diagnostic Lower extremity angiogram: Improved perfusion  Pathology Left foot wound culture: Heavy growth; organism identification pending  Wound assessment and dressing change Left groin wound with reduction in size, now a pinpoint opening. Area cleaned and new dressing applied. No signs of infection or exudate. Wound healing with scab formation and decreased drainage. Surrounding skin without erythema or significant edema.   Review of Systems  Cardiovascular:  Positive for leg swelling.  Skin:  Positive for rash and wound.  All other systems reviewed and are negative.      Objective:   Physical Exam Vitals reviewed.  HENT:     Head: Normocephalic.  Cardiovascular:     Rate and Rhythm: Normal rate.  Pulmonary:     Effort: Pulmonary effort is normal.  Musculoskeletal:     Right Lower Extremity: Right leg is amputated above knee.  Skin:    General: Skin is warm and dry.  Neurological:     Mental Status: He is alert and oriented to person, place, and time.  Psychiatric:        Mood and Affect: Mood normal.        Behavior: Behavior normal.        Thought Content: Thought content normal.        Judgment: Judgment normal.  Physical Exam GENITOURINARY: Groin wound reduced from large hole to pinpoint, treated with Aquacel dressing. SKIN: Rash on arms, legs, and stomach, worse than previous examination, drying out with white heads, pruritic.  BP 133/79   Pulse 94   Resp 17   Ht 6' 5 (1.956 m)   BMI 41.91 kg/m   Past Medical History:  Diagnosis Date   Diabetes mellitus without complication (HCC)     Hypercholesteremia    Hypertension    Pancreatitis     Social History   Socioeconomic History   Marital status: Married    Spouse name: Not on file   Number of children: Not on file   Years of education: Not on file   Highest education level: Not on file  Occupational History   Not on file  Tobacco Use   Smoking status: Former    Types: Cigarettes   Smokeless tobacco: Current    Types: Chew  Vaping Use   Vaping status: Never Used  Substance and Sexual Activity   Alcohol use: No    Alcohol/week: 0.0 standard drinks of alcohol   Drug use: No   Sexual activity: Yes    Partners: Male    Birth control/protection: None  Other Topics Concern   Not on file  Social History Narrative   Not on file   Social Drivers of Health   Tobacco Use: High Risk (06/30/2024)   Patient History    Smoking Tobacco Use: Former    Smokeless Tobacco Use: Current    Passive Exposure: Not on file  Financial Resource Strain: Not on file  Food Insecurity: Patient Declined (11/20/2023)   Epic    Worried About Programme Researcher, Broadcasting/film/video in the Last Year: Patient declined    Barista in the Last Year: Patient declined  Transportation Needs: Patient Declined (11/20/2023)   Epic    Lack of Transportation (Medical): Patient declined    Lack of Transportation (Non-Medical): Patient declined  Physical Activity: Not on file  Stress: Not on file  Social Connections: Patient Declined (10/27/2023)   Social Connection and Isolation Panel    Frequency of Communication with Friends and Family: Patient declined    Frequency of Social Gatherings with Friends and Family: Patient declined    Attends Religious Services: Patient declined    Active Member of Clubs or Organizations: Patient declined    Attends Banker Meetings: Patient declined    Marital Status: Patient declined  Intimate Partner Violence: Not At Risk (11/20/2023)   Epic    Fear of Current or Ex-Partner: No    Emotionally Abused: No     Physically Abused: No    Sexually Abused: No  Depression (PHQ2-9): Not on file  Alcohol Screen: Not on file  Housing: Unknown (11/20/2023)   Epic    Unable to Pay for Housing in the Last Year: Patient declined    Number of Times Moved in the Last Year: 0    Homeless in the Last Year: Patient declined  Utilities: Patient Declined (11/20/2023)   Epic    Threatened with loss of utilities: Patient declined  Health Literacy: Not on file    Past Surgical History:  Procedure Laterality Date   AMPUTATION Right 10/28/2023   Procedure: AMPUTATION, ABOVE KNEE;  Surgeon: Jama Cordella MATSU, MD;  Location: [facility];  Service: Vascular;  Laterality: Right;   ANKLE SURGERY Left 2001,2003   BACK SURGERY  2003,2012   LOWER EXTREMITY ANGIOGRAPHY Left 11/05/2023   Procedure:  Lower Extremity Angiography;  Surgeon: Marea Selinda RAMAN, MD;  Location: [facility];  Service: Cardiovascular;  Laterality: Left;   LOWER EXTREMITY ANGIOGRAPHY Left 05/23/2024   Procedure: Lower Extremity Angiography;  Surgeon: Marea Selinda RAMAN, MD;  Location: [facility];  Service: Cardiovascular;  Laterality: Left;    Family History  Problem Relation Age of Onset   Heart disease Mother    Diabetes Mother    Diabetes Sister     Allergies[1]     Latest Ref Rng & Units 11/23/2023    2:54 AM 11/22/2023    4:52 AM 11/21/2023    4:48 AM  CBC  WBC 4.0 - 10.5 K/uL 5.0  5.2  5.1   Hemoglobin 13.0 - 17.0 g/dL 8.7  8.8  9.0   Hematocrit 39.0 - 52.0 % 28.5  28.6  29.5   Platelets 150 - 400 K/uL 138  132  152       CMP     Component Value Date/Time   NA 137 11/23/2023 0254   NA 135 (L) 03/15/2014 0339   K 4.0 11/23/2023 0254   K 3.1 (L) 03/15/2014 0339   CL 98 11/23/2023 0254   CL 104 03/15/2014 0339   CO2 29 11/23/2023 0254   CO2 24 03/15/2014 0339   GLUCOSE 177 (H) 11/23/2023 0254   GLUCOSE 127 (H) 03/15/2014 0339   BUN 19 05/23/2024 1200   BUN 9 03/15/2014 0339   CREATININE 0.72 05/23/2024 1200    CREATININE 0.64 03/15/2014 0339   CALCIUM  9.7 11/23/2023 0254   CALCIUM  8.4 (L) 03/15/2014 0339   PROT 6.9 11/20/2023 0453   PROT 8.8 (H) 03/11/2014 0620   ALBUMIN  3.0 (L) 11/20/2023 0453   ALBUMIN  3.2 (L) 03/11/2014 0620   AST 26 11/20/2023 0453   AST 42 (H) 03/11/2014 0620   ALT 16 11/20/2023 0453   ALT SEE COMMENT 03/11/2014 0620   ALKPHOS 105 11/20/2023 0453   ALKPHOS 90 03/11/2014 0620   BILITOT 1.3 (H) 11/20/2023 0453   BILITOT 0.8 03/11/2014 0620   GFRNONAA >60 05/23/2024 1200   GFRNONAA >60 03/15/2014 0339     VAS US  ABI WITH/WO TBI Result Date: 06/23/2024  LOWER EXTREMITY DOPPLER STUDY Patient Name:  Martin Riddle  Date of Exam:   06/21/2024 Medical Rec #: [id number]           Accession #:    [id number] Date of Birth: 1971/03/10            Patient Gender: M Patient Age:   68 years Exam Location:  [facility] Procedure:      VAS US  ABI WITH/WO TBI Referring Phys: SELINDA DEW --------------------------------------------------------------------------------  Indications: Ulceration.  Comparison Study: 06/2023 Performing Technologist: Jerel Croak RVT  Examination Guidelines: A complete evaluation includes at minimum, Doppler waveform signals and systolic blood pressure reading at the level of bilateral brachial, anterior tibial, and posterior tibial arteries, when vessel segments are accessible. Bilateral testing is considered an integral part of a complete examination. Photoelectric Plethysmograph (PPG) waveforms and toe systolic pressure readings are included as required and additional duplex testing as needed. Limited examinations for reoccurring indications may be performed as noted.  ABI Findings: +--------+------------------+-----+--------+--------+ Right   Rt Pressure (mmHg)IndexWaveformComment  +--------+------------------+-----+--------+--------+ [id number]                                     +--------+------------------+-----+--------+--------+  +---------+------------------+-----+---------+-------+  Left     Lt Pressure (mmHg)IndexWaveform Comment +---------+------------------+-----+---------+-------+ Brachial 150                                     +---------+------------------+-----+---------+-------+ PTA      180               1.20 triphasic        +---------+------------------+-----+---------+-------+ DP       173               1.15 triphasic        +---------+------------------+-----+---------+-------+ Great [id number]               1.15 Normal           +---------+------------------+-----+---------+-------+ +-------+-----------+-----------+------------+------------+ ABI/TBIToday's ABIToday's TBIPrevious ABIPrevious TBI +-------+-----------+-----------+------------+------------+ Right  AKA                                            +-------+-----------+-----------+------------+------------+ Left   1.20       1.15       NonComp     No access    +-------+-----------+-----------+------------+------------+ Left ABIs and TBIs appear increased compared to prior study on 06/2023.  Summary: Right:  AKA. Left: Resting left ankle-brachial index is within normal range. The left toe-brachial index is normal.  *See table(s) above for measurements and observations.  Electronically signed by Selinda Gu MD on 06/23/2024 at 7:08:28 AM.    Final        Assessment & Plan:   1. Peripheral arterial disease with history of revascularization (Primary) Peripheral arterial disease with left foot ulcer Chronic peripheral arterial disease with a healing left foot ulcer post-intervention. Wound clean, culture shows heavy growth without identified organism. Risk for delayed healing and infection due to diabetes. Significant pain managed with short-term analgesia. - Continued Aquacel wound care and cleanliness. - Nurse to clean and dress wound during visit. - Scheduled podiatry evaluation for further management, including possible  debridement. - Awaiting final wound culture results; management will adjust based on findings. - Scheduled follow-up in 8 weeks to reassess blood flow and wound healing, with option for earlier evaluation if needed. - Instructed to report signs of infection or delayed healing. - VAS US  ABI WITH/WO TBI; Future - VAS US  LOWER EXTREMITY ARTERIAL DUPLEX; Future 2. Hx of AKA (above knee amputation), right [facility]) The patient has received his prosthetic and is very eager to begin working with it.  He will require gait training.  Will refer patient for physical therapy.    3. Rash and nonspecific skin eruption Rash and nonspecific skin eruption Pruritic, erythematous rash with exudate and scabbing, improving on legs. Differential includes contact dermatitis, medication reaction, or contrast-mediated reaction. Topical hydrocortisone and oral antihistamines provide relief. Steroid use limited due to diabetes and wound healing concerns. - Advised avoidance of new blanket suspected of causing contact dermatitis. - Continued topical hydrocortisone and oral antihistamines (Zyrtec). - Calamine lotion considered for additional relief if needed. - Monitor for worsening or persistence; dermatology referral if not improving. - Instructed to follow up with primary care or dermatology if rash worsens or does not resolve.    4. Phantom limb syndrome (HCC) Phantom limb pain Persistent phantom limb pain post-above-knee amputation, with chronic neuropathic pain. Prefers medication management due to prior lack of efficacy and  concern for spinal degeneration. - Referred to pain management clinic for evaluation and possible medication management. - Discussed limitations of current pain management options and challenges in accessing providers who prescribe medications. - Continued current analgesic regimen until seen by pain management.    Medications Ordered Prior to Encounter[2]  There are no Patient Instructions  on file for this visit. Return in about 8 weeks (around 08/23/2024) for ABI and LLE ARt duplex .   Orvin FORBES Daring, NP       [1]  Allergies Allergen Reactions   Morphine Other (See Comments)    headache     A HEADACHE AND SWEATING AND LETHARGIC headache  headache, ,  A HEADACHE AND SWEATING AND LETHARGIC headache  [2]  Current Outpatient Medications on File Prior to Visit  Medication Sig Dispense Refill   acetaminophen  (TYLENOL ) 500 MG tablet Take 1,000 mg by mouth every 6 (six) hours as needed for mild pain or moderate pain.     aspirin  EC 81 MG tablet Take 1 tablet (81 mg total) by mouth daily. Swallow whole. 150 tablet 2   atorvastatin  (LIPITOR) 20 MG tablet Take 1 tablet (20 mg total) by mouth daily. 90 tablet 1   busPIRone  (BUSPAR ) 30 MG tablet Take 30 mg by mouth 3 (three) times daily.     cetirizine (ZYRTEC) 10 MG tablet Take 10 mg by mouth daily.     cloNIDine  (CATAPRES ) 0.2 MG tablet Take 0.2 mg by mouth 3 (three) times daily.     clopidogrel  (PLAVIX ) 75 MG tablet Take 1 tablet (75 mg total) by mouth daily. 30 tablet 11   DULoxetine  (CYMBALTA ) 60 MG capsule Take 60 mg by mouth daily.     insulin  aspart (NOVOLOG ) 100 UNIT/ML injection Inject 30 Units into the skin 3 (three) times daily with meals.     insulin  aspart (NOVOLOG ) 100 UNIT/ML injection Inject 0-20 Units into the skin 4 (four) times daily -  before meals and at bedtime. CBG 70 - 120: 0 units CBG 121 - 150: 3 units CBG 151 - 200: 4 units CBG 201 - 250: 7 units CBG 251 - 300: 11 units CBG 301 - 350: 15 units CBG 351 - 400: 20 units     losartan -hydrochlorothiazide  (HYZAAR) 100-12.5 MG tablet Take 1 tablet by mouth daily. Pt unsure of dose     Multiple Vitamin (MULTIVITAMIN WITH MINERALS) TABS tablet Take 1 tablet by mouth daily. 90 tablet 1   nitroGLYCERIN  (NITROSTAT ) 0.4 MG SL tablet Place under the tongue.     oxyCODONE  (OXY IR/ROXICODONE ) 5 MG immediate release tablet Take 1 tablet (5 mg total) by mouth  every 8 (eight) hours as needed for severe pain (pain score 7-10). 30 tablet 0   polyethylene glycol (MIRALAX  / GLYCOLAX ) packet Take 17 g by mouth daily. 30 each 0   pregabalin  (LYRICA ) 150 MG capsule Take 1 capsule (150 mg total) by mouth 2 (two) times daily. 60 capsule 3   pregabalin  (LYRICA ) 200 MG capsule Take 1 capsule (200 mg total) by mouth 2 (two) times daily. 60 capsule 0   rOPINIRole (REQUIP) 1 MG tablet Take 1 mg by mouth at bedtime.     insulin  glargine-yfgn (SEMGLEE ) 100 UNIT/ML injection Inject 0.9 mLs (90 Units total) into the skin 2 (two) times daily. (Patient not taking: Reported on 06/21/2024)     nystatin  (MYCOSTATIN /NYSTOP ) powder Apply 1 Application topically 3 (three) times daily. 15 g 0   No current facility-administered medications on file prior to  visit.   "

## 2024-07-12 ENCOUNTER — Telehealth (INDEPENDENT_AMBULATORY_CARE_PROVIDER_SITE_OTHER): Payer: Self-pay

## 2024-07-12 NOTE — Telephone Encounter (Signed)
 [facility] called wanting verbal approval to push out PT for 4 week follow up per patient. Per NP approval given left vm on [name]'s number at [phone]. No further questions at this time

## 2024-08-23 ENCOUNTER — Encounter (INDEPENDENT_AMBULATORY_CARE_PROVIDER_SITE_OTHER)

## 2024-08-23 ENCOUNTER — Ambulatory Visit (INDEPENDENT_AMBULATORY_CARE_PROVIDER_SITE_OTHER): Admitting: Nurse Practitioner
